# Patient Record
Sex: Female | Born: 1955 | Race: White | Hispanic: No | Smoking: Current every day smoker
Health system: Southern US, Community
[De-identification: ages and names within clinical notes are randomized; demographics above are authoritative.]

## PROBLEM LIST (undated history)

## (undated) ENCOUNTER — Emergency Department: Discharge status: Absent without leave | Payer: Medicare Other

## (undated) DIAGNOSIS — F419 Anxiety disorder, unspecified: Secondary | ICD-10-CM

## (undated) DIAGNOSIS — M1990 Unspecified osteoarthritis, unspecified site: Secondary | ICD-10-CM

## (undated) DIAGNOSIS — F329 Major depressive disorder, single episode, unspecified: Secondary | ICD-10-CM

## (undated) DIAGNOSIS — G629 Polyneuropathy, unspecified: Secondary | ICD-10-CM

## (undated) DIAGNOSIS — C801 Malignant (primary) neoplasm, unspecified: Secondary | ICD-10-CM

## (undated) DIAGNOSIS — J45909 Unspecified asthma, uncomplicated: Secondary | ICD-10-CM

## (undated) DIAGNOSIS — G4700 Insomnia, unspecified: Secondary | ICD-10-CM

## (undated) DIAGNOSIS — F32A Depression, unspecified: Secondary | ICD-10-CM

## (undated) DIAGNOSIS — F1010 Alcohol abuse, uncomplicated: Secondary | ICD-10-CM

## (undated) DIAGNOSIS — F319 Bipolar disorder, unspecified: Secondary | ICD-10-CM

## (undated) DIAGNOSIS — R011 Cardiac murmur, unspecified: Secondary | ICD-10-CM

## (undated) HISTORY — PX: ABDOMINAL HYSTERECTOMY: SHX81

## (undated) HISTORY — PX: ARM AMPUTATION AT SHOULDER: SHX552

## (undated) HISTORY — PX: WISDOM TOOTH EXTRACTION: SHX21

## (undated) HISTORY — PX: COLONOSCOPY: SHX174

---

## 1996-06-23 HISTORY — PX: ARM AMPUTATION: SUR21

## 2004-03-20 ENCOUNTER — Other Ambulatory Visit: Payer: Self-pay

## 2004-06-13 ENCOUNTER — Emergency Department: Payer: Self-pay | Admitting: Emergency Medicine

## 2004-06-14 ENCOUNTER — Inpatient Hospital Stay: Payer: Self-pay | Admitting: Psychiatry

## 2004-06-16 ENCOUNTER — Emergency Department: Payer: Self-pay | Admitting: Emergency Medicine

## 2004-06-17 ENCOUNTER — Inpatient Hospital Stay: Payer: Self-pay | Admitting: Unknown Physician Specialty

## 2004-10-21 ENCOUNTER — Inpatient Hospital Stay: Payer: Self-pay | Admitting: Unknown Physician Specialty

## 2004-11-05 ENCOUNTER — Ambulatory Visit: Payer: Self-pay | Admitting: Unknown Physician Specialty

## 2004-11-21 ENCOUNTER — Ambulatory Visit: Payer: Self-pay | Admitting: Unknown Physician Specialty

## 2004-12-21 ENCOUNTER — Ambulatory Visit: Payer: Self-pay | Admitting: Unknown Physician Specialty

## 2005-01-21 ENCOUNTER — Ambulatory Visit: Payer: Self-pay | Admitting: Unknown Physician Specialty

## 2005-06-23 HISTORY — PX: ABDOMINAL HYSTERECTOMY: SHX81

## 2005-10-09 ENCOUNTER — Ambulatory Visit: Payer: Self-pay | Admitting: Family Medicine

## 2006-05-20 ENCOUNTER — Emergency Department: Payer: Self-pay

## 2006-06-26 ENCOUNTER — Emergency Department: Payer: Self-pay | Admitting: Emergency Medicine

## 2006-07-07 ENCOUNTER — Other Ambulatory Visit: Payer: Self-pay

## 2006-07-07 ENCOUNTER — Emergency Department: Payer: Self-pay | Admitting: Emergency Medicine

## 2006-10-05 ENCOUNTER — Emergency Department: Payer: Self-pay | Admitting: Emergency Medicine

## 2006-10-26 ENCOUNTER — Emergency Department: Payer: Self-pay | Admitting: Emergency Medicine

## 2006-10-28 ENCOUNTER — Emergency Department: Payer: Self-pay

## 2006-12-03 ENCOUNTER — Emergency Department: Payer: Self-pay | Admitting: Emergency Medicine

## 2006-12-03 ENCOUNTER — Other Ambulatory Visit: Payer: Self-pay

## 2007-02-27 ENCOUNTER — Emergency Department: Payer: Self-pay | Admitting: Emergency Medicine

## 2008-04-22 ENCOUNTER — Emergency Department: Payer: Self-pay | Admitting: Emergency Medicine

## 2008-07-18 ENCOUNTER — Ambulatory Visit: Payer: Self-pay | Admitting: Family Medicine

## 2008-08-24 ENCOUNTER — Inpatient Hospital Stay: Payer: Self-pay | Admitting: Internal Medicine

## 2008-08-26 ENCOUNTER — Inpatient Hospital Stay: Payer: Self-pay | Admitting: Psychiatry

## 2010-06-23 HISTORY — PX: JOINT REPLACEMENT: SHX530

## 2012-07-21 ENCOUNTER — Emergency Department: Payer: Self-pay

## 2012-07-21 LAB — COMPREHENSIVE METABOLIC PANEL
Anion Gap: 11 (ref 7–16)
BUN: 17 mg/dL (ref 7–18)
Calcium, Total: 9.1 mg/dL (ref 8.5–10.1)
Chloride: 109 mmol/L — ABNORMAL HIGH (ref 98–107)
Co2: 23 mmol/L (ref 21–32)
Creatinine: 0.67 mg/dL (ref 0.60–1.30)
Glucose: 84 mg/dL (ref 65–99)
Osmolality: 286 (ref 275–301)
SGOT(AST): 20 U/L (ref 15–37)
Sodium: 143 mmol/L (ref 136–145)
Total Protein: 8.2 g/dL (ref 6.4–8.2)

## 2012-07-21 LAB — CBC
HCT: 45.1 % (ref 35.0–47.0)
HGB: 15.4 g/dL (ref 12.0–16.0)
MCHC: 34.1 g/dL (ref 32.0–36.0)
MCV: 89 fL (ref 80–100)
Platelet: 277 10*3/uL (ref 150–440)
WBC: 11.6 10*3/uL — ABNORMAL HIGH (ref 3.6–11.0)

## 2012-07-21 LAB — DRUG SCREEN, URINE
Barbiturates, Ur Screen: NEGATIVE (ref ?–200)
Cannabinoid 50 Ng, Ur ~~LOC~~: NEGATIVE (ref ?–50)
Cocaine Metabolite,Ur ~~LOC~~: NEGATIVE (ref ?–300)
MDMA (Ecstasy)Ur Screen: NEGATIVE (ref ?–500)
Opiate, Ur Screen: NEGATIVE (ref ?–300)
Phencyclidine (PCP) Ur S: NEGATIVE (ref ?–25)
Tricyclic, Ur Screen: NEGATIVE (ref ?–1000)

## 2012-07-21 LAB — URINALYSIS, COMPLETE
Bilirubin,UR: NEGATIVE
Glucose,UR: NEGATIVE mg/dL (ref 0–75)
Leukocyte Esterase: NEGATIVE
Nitrite: NEGATIVE
Protein: NEGATIVE
RBC,UR: NONE SEEN /HPF (ref 0–5)
WBC UR: 1 /HPF (ref 0–5)

## 2012-07-21 LAB — TSH: Thyroid Stimulating Horm: 2.29 u[IU]/mL

## 2013-09-20 ENCOUNTER — Ambulatory Visit: Payer: Self-pay | Admitting: Internal Medicine

## 2013-12-21 ENCOUNTER — Encounter (HOSPITAL_COMMUNITY): Payer: Self-pay | Admitting: *Deleted

## 2013-12-21 ENCOUNTER — Encounter (HOSPITAL_COMMUNITY): Payer: Self-pay | Admitting: Emergency Medicine

## 2013-12-21 ENCOUNTER — Emergency Department (EMERGENCY_DEPARTMENT_HOSPITAL)
Admission: EM | Admit: 2013-12-21 | Discharge: 2013-12-21 | Disposition: A | Discharge status: Other Acute Inpatient Hospital | Payer: Medicare Other | Source: Home / Self Care | Attending: Emergency Medicine | Admitting: Emergency Medicine

## 2013-12-21 ENCOUNTER — Inpatient Hospital Stay (HOSPITAL_COMMUNITY)
Admission: AD | Admit: 2013-12-21 | Discharge: 2013-12-27 | DRG: 897 | Disposition: A | Discharge status: Rehabilitation Facility | Payer: Medicare Other | Source: Intra-hospital | Attending: Psychiatry | Admitting: Psychiatry

## 2013-12-21 DIAGNOSIS — F17200 Nicotine dependence, unspecified, uncomplicated: Secondary | ICD-10-CM | POA: Diagnosis present

## 2013-12-21 DIAGNOSIS — F10930 Alcohol use, unspecified with withdrawal, uncomplicated: Secondary | ICD-10-CM

## 2013-12-21 DIAGNOSIS — F10239 Alcohol dependence with withdrawal, unspecified: Secondary | ICD-10-CM | POA: Insufficient documentation

## 2013-12-21 DIAGNOSIS — R000 Tachycardia, unspecified: Secondary | ICD-10-CM | POA: Insufficient documentation

## 2013-12-21 DIAGNOSIS — F10230 Alcohol dependence with withdrawal, uncomplicated: Secondary | ICD-10-CM

## 2013-12-21 DIAGNOSIS — R45851 Suicidal ideations: Secondary | ICD-10-CM

## 2013-12-21 DIAGNOSIS — F410 Panic disorder [episodic paroxysmal anxiety] without agoraphobia: Secondary | ICD-10-CM | POA: Diagnosis present

## 2013-12-21 DIAGNOSIS — F1010 Alcohol abuse, uncomplicated: Secondary | ICD-10-CM

## 2013-12-21 DIAGNOSIS — F1020 Alcohol dependence, uncomplicated: Principal | ICD-10-CM | POA: Diagnosis present

## 2013-12-21 DIAGNOSIS — Z79899 Other long term (current) drug therapy: Secondary | ICD-10-CM | POA: Insufficient documentation

## 2013-12-21 DIAGNOSIS — G4700 Insomnia, unspecified: Secondary | ICD-10-CM | POA: Diagnosis present

## 2013-12-21 DIAGNOSIS — F332 Major depressive disorder, recurrent severe without psychotic features: Secondary | ICD-10-CM | POA: Diagnosis present

## 2013-12-21 DIAGNOSIS — K5900 Constipation, unspecified: Secondary | ICD-10-CM | POA: Diagnosis present

## 2013-12-21 DIAGNOSIS — F411 Generalized anxiety disorder: Secondary | ICD-10-CM | POA: Diagnosis present

## 2013-12-21 DIAGNOSIS — F4310 Post-traumatic stress disorder, unspecified: Secondary | ICD-10-CM | POA: Diagnosis present

## 2013-12-21 DIAGNOSIS — F10939 Alcohol use, unspecified with withdrawal, unspecified: Secondary | ICD-10-CM | POA: Insufficient documentation

## 2013-12-21 HISTORY — DX: Major depressive disorder, single episode, unspecified: F32.9

## 2013-12-21 HISTORY — DX: Depression, unspecified: F32.A

## 2013-12-21 LAB — URINALYSIS, ROUTINE W REFLEX MICROSCOPIC
Bilirubin Urine: NEGATIVE
Glucose, UA: NEGATIVE mg/dL
Ketones, ur: NEGATIVE mg/dL
Leukocytes, UA: NEGATIVE
Nitrite: NEGATIVE
Protein, ur: NEGATIVE mg/dL
Specific Gravity, Urine: 1.002 — ABNORMAL LOW (ref 1.005–1.030)
Urobilinogen, UA: 0.2 mg/dL (ref 0.0–1.0)
pH: 5.5 (ref 5.0–8.0)

## 2013-12-21 LAB — BASIC METABOLIC PANEL
Anion gap: 19 — ABNORMAL HIGH (ref 5–15)
BUN: 11 mg/dL (ref 6–23)
CO2: 21 mEq/L (ref 19–32)
Calcium: 9.3 mg/dL (ref 8.4–10.5)
Chloride: 98 mEq/L (ref 96–112)
Creatinine, Ser: 0.64 mg/dL (ref 0.50–1.10)
GFR calc Af Amer: >90 mL/min (ref >90)
GFR calc non Af Amer: >90 mL/min (ref >90)
Glucose, Bld: 92 mg/dL (ref 70–99)
Potassium: 3.7 mEq/L (ref 3.7–5.3)
Sodium: 138 mEq/L (ref 137–147)

## 2013-12-21 LAB — CBC WITH DIFFERENTIAL/PLATELET
Basophils Absolute: 0 10*3/uL (ref 0.0–0.1)
Basophils Relative: 0 % (ref 0–1)
Eosinophils Absolute: 0 10*3/uL (ref 0.0–0.7)
Eosinophils Relative: 0 % (ref 0–5)
HCT: 44.6 % (ref 36.0–46.0)
Hemoglobin: 15.6 g/dL — ABNORMAL HIGH (ref 12.0–15.0)
Lymphocytes Relative: 17 % (ref 12–46)
Lymphs Abs: 1.8 10*3/uL (ref 0.7–4.0)
MCH: 30.8 pg (ref 26.0–34.0)
MCHC: 35 g/dL (ref 30.0–36.0)
MCV: 88 fL (ref 78.0–100.0)
Monocytes Absolute: 0.7 10*3/uL (ref 0.1–1.0)
Monocytes Relative: 7 % (ref 3–12)
Neutro Abs: 8 10*3/uL — ABNORMAL HIGH (ref 1.7–7.7)
Neutrophils Relative %: 76 % (ref 43–77)
Platelets: 239 10*3/uL (ref 150–400)
RBC: 5.07 MIL/uL (ref 3.87–5.11)
RDW: 14.5 % (ref 11.5–15.5)
WBC: 10.6 10*3/uL — ABNORMAL HIGH (ref 4.0–10.5)

## 2013-12-21 LAB — RAPID URINE DRUG SCREEN, HOSP PERFORMED
Amphetamines: NOT DETECTED
Barbiturates: NOT DETECTED
Benzodiazepines: NOT DETECTED
Cocaine: NOT DETECTED
Opiates: NOT DETECTED
Tetrahydrocannabinol: NOT DETECTED

## 2013-12-21 LAB — URINE MICROSCOPIC-ADD ON

## 2013-12-21 LAB — ETHANOL: Alcohol, Ethyl (B): 159 mg/dL — ABNORMAL HIGH (ref 0–11)

## 2013-12-21 MED ORDER — ACETAMINOPHEN 325 MG PO TABS
650.0000 mg | ORAL_TABLET | Freq: Four times a day (QID) | ORAL | Status: DC | PRN
  Administered 2013-12-25: 650 mg via ORAL

## 2013-12-21 MED ORDER — ACETAMINOPHEN 325 MG PO TABS: 650.0000 mg | ORAL_TABLET | ORAL | Status: DC | PRN

## 2013-12-21 MED ORDER — HYDROXYZINE HCL 25 MG PO TABS: 25.0000 mg | ORAL_TABLET | Freq: Four times a day (QID) | ORAL | Status: DC | PRN

## 2013-12-21 MED ORDER — ONDANSETRON HCL 4 MG PO TABS: 4.0000 mg | ORAL_TABLET | Freq: Three times a day (TID) | ORAL | Status: DC | PRN

## 2013-12-21 MED ORDER — SODIUM CHLORIDE 0.9 % IV BOLUS (SEPSIS)
1000.0000 mL | Freq: Once | INTRAVENOUS | Status: AC
  Administered 2013-12-21: 1000 mL via INTRAVENOUS

## 2013-12-21 MED ORDER — LORAZEPAM 2 MG/ML IJ SOLN
2.0000 mg | Freq: Once | INTRAMUSCULAR | Status: AC
  Administered 2013-12-21: 2 mg via INTRAVENOUS
  Filled 2013-12-21: qty 1

## 2013-12-21 MED ORDER — ADULT MULTIVITAMIN W/MINERALS CH: 1.0000 | ORAL_TABLET | Freq: Every day | ORAL | Status: DC

## 2013-12-21 MED ORDER — FOLIC ACID 1 MG PO TABS
1.0000 mg | ORAL_TABLET | Freq: Every day | ORAL | Status: DC
  Administered 2013-12-22 – 2013-12-27 (×6): 1 mg via ORAL
  Filled 2013-12-21 (×7): qty 1

## 2013-12-21 MED ORDER — FOLIC ACID 1 MG PO TABS
1.0000 mg | ORAL_TABLET | Freq: Every day | ORAL | Status: DC
  Administered 2013-12-21: 1 mg via ORAL
  Filled 2013-12-21: qty 1

## 2013-12-21 MED ORDER — CHLORDIAZEPOXIDE HCL 25 MG PO CAPS
25.0000 mg | ORAL_CAPSULE | Freq: Every day | ORAL | Status: AC
  Administered 2013-12-25: 25 mg via ORAL
  Filled 2013-12-21: qty 1

## 2013-12-21 MED ORDER — VITAMIN B-1 100 MG PO TABS
100.0000 mg | ORAL_TABLET | Freq: Every day | ORAL | Status: DC
  Administered 2013-12-22 – 2013-12-27 (×6): 100 mg via ORAL
  Filled 2013-12-21 (×7): qty 1

## 2013-12-21 MED ORDER — CHLORDIAZEPOXIDE HCL 25 MG PO CAPS
25.0000 mg | ORAL_CAPSULE | Freq: Four times a day (QID) | ORAL | Status: AC
  Administered 2013-12-21 – 2013-12-22 (×6): 25 mg via ORAL
  Filled 2013-12-21 (×6): qty 1

## 2013-12-21 MED ORDER — BUPROPION HCL ER (XL) 150 MG PO TB24
150.0000 mg | ORAL_TABLET | Freq: Every day | ORAL | Status: DC
  Filled 2013-12-21 (×3): qty 1

## 2013-12-21 MED ORDER — ONDANSETRON 4 MG PO TBDP
4.0000 mg | ORAL_TABLET | Freq: Once | ORAL | Status: AC
  Administered 2013-12-21: 4 mg via ORAL
  Filled 2013-12-21: qty 1

## 2013-12-21 MED ORDER — VITAMIN B-1 100 MG PO TABS
100.0000 mg | ORAL_TABLET | Freq: Once | ORAL | Status: AC
  Administered 2013-12-21: 100 mg via ORAL
  Filled 2013-12-21: qty 1

## 2013-12-21 MED ORDER — TRAZODONE HCL 100 MG PO TABS
200.0000 mg | ORAL_TABLET | Freq: Every day | ORAL | Status: DC
  Administered 2013-12-21 – 2013-12-26 (×6): 200 mg via ORAL
  Filled 2013-12-21 (×3): qty 2
  Filled 2013-12-21: qty 8
  Filled 2013-12-21 (×6): qty 2

## 2013-12-21 MED ORDER — ADULT MULTIVITAMIN W/MINERALS CH
1.0000 | ORAL_TABLET | Freq: Every day | ORAL | Status: DC
  Administered 2013-12-22 – 2013-12-27 (×6): 1 via ORAL
  Filled 2013-12-21 (×7): qty 1

## 2013-12-21 MED ORDER — ADULT MULTIVITAMIN W/MINERALS CH
1.0000 | ORAL_TABLET | Freq: Every day | ORAL | Status: DC
  Administered 2013-12-21: 1 via ORAL
  Filled 2013-12-21: qty 1

## 2013-12-21 MED ORDER — CHLORDIAZEPOXIDE HCL 25 MG PO CAPS
25.0000 mg | ORAL_CAPSULE | Freq: Three times a day (TID) | ORAL | Status: AC
  Administered 2013-12-23 (×3): 25 mg via ORAL
  Filled 2013-12-21 (×3): qty 1

## 2013-12-21 MED ORDER — ONDANSETRON 4 MG PO TBDP
4.0000 mg | ORAL_TABLET | Freq: Four times a day (QID) | ORAL | Status: DC | PRN
  Administered 2013-12-22: 4 mg via ORAL
  Filled 2013-12-21: qty 1

## 2013-12-21 MED ORDER — LOPERAMIDE HCL 2 MG PO CAPS
2.0000 mg | ORAL_CAPSULE | ORAL | Status: AC | PRN
  Administered 2013-12-22: 4 mg via ORAL
  Filled 2013-12-21: qty 2

## 2013-12-21 MED ORDER — MAGNESIUM HYDROXIDE 400 MG/5ML PO SUSP: 30.0000 mL | Freq: Every day | ORAL | Status: DC | PRN

## 2013-12-21 MED ORDER — ALUM & MAG HYDROXIDE-SIMETH 200-200-20 MG/5ML PO SUSP: 30.0000 mL | ORAL | Status: DC | PRN

## 2013-12-21 MED ORDER — THIAMINE HCL 100 MG/ML IJ SOLN: 100.0000 mg | Freq: Once | INTRAMUSCULAR | Status: DC

## 2013-12-21 MED ORDER — LORAZEPAM 1 MG PO TABS
2.0000 mg | ORAL_TABLET | Freq: Once | ORAL | Status: AC
  Administered 2013-12-21: 2 mg via ORAL
  Filled 2013-12-21: qty 2

## 2013-12-21 MED ORDER — CHLORDIAZEPOXIDE HCL 25 MG PO CAPS
25.0000 mg | ORAL_CAPSULE | ORAL | Status: AC
  Administered 2013-12-24 (×2): 25 mg via ORAL
  Filled 2013-12-21 (×2): qty 1

## 2013-12-21 MED ORDER — ARIPIPRAZOLE 5 MG PO TABS
5.0000 mg | ORAL_TABLET | Freq: Every day | ORAL | Status: DC
  Filled 2013-12-21 (×3): qty 1

## 2013-12-21 MED ORDER — GABAPENTIN 400 MG PO CAPS
800.0000 mg | ORAL_CAPSULE | Freq: Three times a day (TID) | ORAL | Status: DC
  Administered 2013-12-21 – 2013-12-27 (×17): 800 mg via ORAL
  Filled 2013-12-21 (×6): qty 2
  Filled 2013-12-21: qty 24
  Filled 2013-12-21 (×2): qty 2
  Filled 2013-12-21: qty 24
  Filled 2013-12-21: qty 2
  Filled 2013-12-21: qty 24
  Filled 2013-12-21 (×13): qty 2

## 2013-12-21 MED ORDER — CHLORDIAZEPOXIDE HCL 25 MG PO CAPS
25.0000 mg | ORAL_CAPSULE | Freq: Four times a day (QID) | ORAL | Status: DC | PRN
  Administered 2013-12-21: 25 mg via ORAL
  Filled 2013-12-21: qty 1

## 2013-12-21 NOTE — ED Provider Notes (Addendum)
CSN: [id number]     Arrival date & time 12/21/13  0941 History   First MD Initiated Contact with Patient 12/21/13 [phone]     No chief complaint on file.    (Consider location/radiation/quality/duration/timing/severity/associated sxs/prior Treatment) HPI  58 year old female presenting for alcohol withdrawal/detox.  patient has a past history of alcohol abuse. Was sober for 4 years.  Relapsed a few months ago. She drinks approximately one case of beer per day. Last drink was at 8:00 this morning. Endorses symptoms of nervousness, nausea, vomiting and diarrhea. She feels like her skin is crawling. Denies any other substance abuse. Denies any suicidal or homicidal ideations. Never had a seizure or hallucinations as past of withdrawal symptoms.   No past medical history on file. No past surgical history on file. No family history on file. History  Substance Use Topics  . Smoking status: Not on file  . Smokeless tobacco: Not on file  . Alcohol Use: Not on file   OB History   No data available     Review of Systems  All systems reviewed and negative, other than as noted in HPI.   Allergies  Cephalosporins  Home Medications   Prior to Admission medications   Medication Sig Start Date End Date Taking? Authorizing Provider  gabapentin (NEURONTIN) 800 MG tablet Take 800 mg by mouth 3 (three) times daily.   Yes Historical Provider, MD  traZODone (DESYREL) 100 MG tablet Take 200 mg by mouth at bedtime.    Yes Historical Provider, MD  venlafaxine XR (EFFEXOR-XR) 150 MG 24 hr capsule Take 150 mg by mouth daily with breakfast.   Yes Historical Provider, MD  ARIPiprazole (ABILIFY) 5 MG tablet Take 5 mg by mouth daily.    Historical Provider, MD  buPROPion (WELLBUTRIN) 75 MG tablet Take 75 mg by mouth 2 (two) times daily.    Historical Provider, MD   BP 114/88  Pulse 124  Temp(Src) 98.1 F (36.7 C) (Oral)  Resp 25  SpO2 95% Physical Exam  Nursing note and vitals  reviewed. Constitutional: She appears well-developed and well-nourished. No distress.  HENT:  Head: Normocephalic and atraumatic.  Eyes: Conjunctivae are normal. Right eye exhibits no discharge. Left eye exhibits no discharge.  Neck: Neck supple.  Cardiovascular: Regular rhythm and normal heart sounds.  Exam reveals no gallop and no friction rub.   No murmur heard. tachycardia  Pulmonary/Chest: Effort normal and breath sounds normal. No respiratory distress.  Abdominal: Soft. She exhibits no distension. There is no tenderness.  Musculoskeletal: She exhibits no edema and no tenderness.  L arm amp  Neurological: She is alert.  Skin: Skin is warm and dry.  Psychiatric:  Seems anxious. Speech clear. Content appropriate. Does not appear to be responding to internal stimuli.     ED Course  Procedures (including critical care time) Labs Review Labs Reviewed  CBC WITH DIFFERENTIAL - Abnormal; Notable for the following:    WBC 10.6 (*)    Hemoglobin 15.6 (*)    Neutro Abs 8.0 (*)    All other components within normal limits  BASIC METABOLIC PANEL - Abnormal; Notable for the following:    Anion gap 19 (*)    All other components within normal limits  ETHANOL - Abnormal; Notable for the following:    Alcohol, Ethyl (B) 159 (*)    All other components within normal limits  URINALYSIS, ROUTINE W REFLEX MICROSCOPIC - Abnormal; Notable for the following:    Specific Gravity, Urine 1.002 (*)  Hgb urine dipstick SMALL (*)    All other components within normal limits  URINE RAPID DRUG SCREEN (HOSP PERFORMED)  URINE MICROSCOPIC-ADD ON    Imaging Review No results found.   EKG Interpretation   Date/Time:  Wednesday December 21 2013 10:29:10 EDT Ventricular Rate:  126 PR Interval:  153 QRS Duration: 84 QT Interval:  304 QTC Calculation: 440 R Axis:   7 Text Interpretation:  Sinus tachycardia Anterior infarct, old baseline  wander in multiple leads No old tracing to compare Confirmed  by Taysom Glymph  MD,  [place] ([id number]) on 12/21/2013 10:32:38 AM      MDM   Final diagnoses:  Alcohol abuse  Alcohol withdrawal, uncomplicated    58 year old female with alcohol abuse and withdrawal symptoms. Past history depression, anxiety and borderline personality disorder. She denies any suicidal homicidal ideation. She does not appear to be psychotic. Will attempt to place for detox/alcohol abuse. Tachycardia likely related to withdrawal. PRN benzos and observation.   2:15 PM Pt accepted at [facility], but would like tachycardia improved prior to transfer. Will place IV. NS bolus Additional ativan .  Virgel Manifold, MD 12/21/13 1416
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
## 2013-12-21 NOTE — ED Notes (Addendum)
Pt belongings transferred to nurse desk by room 16 pt has 2 bags

## 2013-12-21 NOTE — BHH Group Notes (Signed)
Adult Psychoeducational Group Note  Date:  12/21/2013 Time:  10:30 PM  Group Topic/Focus:  Personal Choices and Values:   The focus of this group is to help patients assess and explore the importance of values in their lives, how their values affect their decisions, how they express their values and what opposes their expression.  Participation Level:  Active  Participation Quality:  Appropriate  Affect:  Appropriate  Cognitive:  Alert  Insight: Appropriate  Engagement in Group:  Engaged  Modes of Intervention:  Discussion  Additional Comments:  Pt rated energy level a 1 out of 10.  Pt stated she is here at [facility] because of her alcohol dependency.  Pt stated she is extremely exhausted tonight and just wants to sleep and is hoping tomorrow will be a better day for her. Pt is glad she is here at [facility].  [name] G 12/21/2013, 10:30 PM

## 2013-12-21 NOTE — ED Notes (Signed)
Attempted to call report to [name].  They can not accept the Pt, due to her heart rate.  Will notify MD.

## 2013-12-21 NOTE — Progress Notes (Signed)
Pt observed lying in bed with eyes closed.  Pt promptly responded to her name when called.  Pt states she is here for alcohol detox.  Pt reports she is still feeling anxious and shaky.  Pt denies SI/HI/AV.  Pt was encouraged to make staff aware of her needs.  Pt voices understanding.  Support and encouragement offered.  Safety maintained with q15 minute checks.

## 2013-12-21 NOTE — ED Notes (Addendum)
Pt presents wanting ETOH detox.  C/o emesis, diaphoresis, and increased anxiety.  Report last drink, one can of beer, at 0800.  Pt reports drinking a case of beer per day.  Sts passive SI.  Denies HI and hallucinations.

## 2013-12-21 NOTE — Treatment Plan (Signed)
Pt remains accepted by [facility] pending stabilization of pt's HR which currently in the 120's.  A bed is being held for this patient.

## 2013-12-21 NOTE — Consult Note (Signed)
  Evaluated [name] who says she drinks a case of beer daily.  She was sober for 4 years and started drinking again about 6 months ago.  Says she is stressed with living with her daughter.   She is having withdrawal symptoms.  She has been accepted at [facility] bed 302-2 for treatment of detox from alcohol.

## 2013-12-21 NOTE — BH Assessment (Signed)
Patient accepted to [name] by Dr. Lovena Le. The attending physician is Dr. Carlton Adam. The room assignment is 302-2. Nursing report # is [phone].

## 2013-12-22 DIAGNOSIS — F4310 Post-traumatic stress disorder, unspecified: Secondary | ICD-10-CM | POA: Diagnosis present

## 2013-12-22 DIAGNOSIS — F1994 Other psychoactive substance use, unspecified with psychoactive substance-induced mood disorder: Secondary | ICD-10-CM

## 2013-12-22 LAB — COMPREHENSIVE METABOLIC PANEL
ALT: 17 U/L (ref 0–35)
AST: 25 U/L (ref 0–37)
Albumin: 3.7 g/dL (ref 3.5–5.2)
Alkaline Phosphatase: 85 U/L (ref 39–117)
Anion gap: 13 (ref 5–15)
BILIRUBIN TOTAL: 0.6 mg/dL (ref 0.3–1.2)
BUN: 10 mg/dL (ref 6–23)
CHLORIDE: 100 meq/L (ref 96–112)
CO2: 25 mEq/L (ref 19–32)
Calcium: 9.2 mg/dL (ref 8.4–10.5)
Creatinine, Ser: 0.85 mg/dL (ref 0.50–1.10)
GFR calc Af Amer: 85 mL/min — ABNORMAL LOW (ref >90)
GFR calc non Af Amer: 74 mL/min — ABNORMAL LOW (ref >90)
Glucose, Bld: 142 mg/dL — ABNORMAL HIGH (ref 70–99)
Potassium: 3.7 mEq/L (ref 3.7–5.3)
Sodium: 138 mEq/L (ref 137–147)
Total Protein: 6.8 g/dL (ref 6.0–8.3)

## 2013-12-22 LAB — TSH: TSH: 3.1 u[IU]/mL (ref 0.350–4.500)

## 2013-12-22 MED ORDER — ONDANSETRON HCL 4 MG PO TABS
8.0000 mg | ORAL_TABLET | Freq: Three times a day (TID) | ORAL | Status: DC | PRN
  Administered 2013-12-23: 8 mg via ORAL
  Filled 2013-12-22: qty 2

## 2013-12-22 MED ORDER — PROPRANOLOL HCL 10 MG PO TABS
10.0000 mg | ORAL_TABLET | Freq: Two times a day (BID) | ORAL | Status: DC
  Administered 2013-12-22 – 2013-12-27 (×11): 10 mg via ORAL
  Filled 2013-12-22 (×8): qty 1
  Filled 2013-12-22: qty 8
  Filled 2013-12-22 (×6): qty 1
  Filled 2013-12-22: qty 8
  Filled 2013-12-22: qty 1

## 2013-12-22 MED ORDER — CHLORDIAZEPOXIDE HCL 25 MG PO CAPS
25.0000 mg | ORAL_CAPSULE | Freq: Four times a day (QID) | ORAL | Status: DC | PRN
  Administered 2013-12-22 – 2013-12-26 (×4): 25 mg via ORAL
  Filled 2013-12-22 (×4): qty 1

## 2013-12-22 MED ORDER — VENLAFAXINE HCL ER 150 MG PO CP24
150.0000 mg | ORAL_CAPSULE | Freq: Every day | ORAL | Status: DC
  Administered 2013-12-23 – 2013-12-27 (×5): 150 mg via ORAL
  Filled 2013-12-22: qty 4
  Filled 2013-12-22 (×6): qty 1

## 2013-12-22 MED ORDER — CHLORDIAZEPOXIDE HCL 25 MG PO CAPS
25.0000 mg | ORAL_CAPSULE | Freq: Once | ORAL | Status: AC
  Administered 2013-12-22: 25 mg via ORAL
  Filled 2013-12-22: qty 1

## 2013-12-22 NOTE — Clinical Social Work Note (Signed)
Pt provided with [employer] information per her request and was provided with [facility] list for [place]. Pt plans to follow up with [facility] in [place] at this time. CSW assessing for additional resources that accept Medicare-pt not willing to go out of state or to [facility].  [place], LCSWA 12/22/2013 11:23 AM
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
## 2013-12-22 NOTE — Clinical Social Work Note (Signed)
Per Pt request, [facility] referral sent today at 4:15PM. CSW to check status of referral tomorrow morning. Pt also provided with comprehensive [place] house list, [facility], and [employer] [employer].   [place], LCSWA 12/22/2013 4:17 PM

## 2013-12-22 NOTE — Progress Notes (Signed)
Adult Psychoeducational Group Note  Date:  12/22/2013 Time:  10:27 PM  Group Topic/Focus:  Wrap-Up Group:   The focus of this group is to help patients review their daily goal of treatment and discuss progress on daily workbooks.  Participation Level:  Active  Participation Quality:  Appropriate  Affect:  Appropriate  Cognitive:  Alert  Insight: Appropriate  Engagement in Group:  Supportive  Modes of Intervention:  Activity  Additional Comments:  PT was in group and supported others while they decided to sing during groip  Davarious Tumbleson R 12/22/2013, 10:27 PM

## 2013-12-22 NOTE — BHH Group Notes (Signed)
[place] LCSW Group Therapy  12/22/2013 3:21 PM  Type of Therapy:  Group Therapy  Participation Level:  Active  Participation Quality:  Attentive  Affect:  Appropriate  Cognitive:  Alert and Oriented  Insight:  Engaged  Engagement in Therapy:  Engaged  Modes of Intervention:  Confrontation, Discussion, Education, Exploration, Problem-solving, Rapport Building, Socialization and Support  Summary of Progress/Problems:  Finding Balance in Life. Today's group focused on defining balance in one's own words, identifying things that can knock one off balance, and exploring healthy ways to maintain balance in life. Group members were asked to provide an example of a time when they felt off balance, describe how they handled that situation,and process healthier ways to regain balance in the future. Group members were asked to share the most important tool for maintaining balance that they learned while at [facility] and how they plan to apply this method after discharge. [name] was attentive and engaged throughout today's therapy group. She shared that she plans to pursue inpatient treatment/[facility] due to her past success at staying sober after going there. [name] shows progress in the group setting and improving insight AEB her ability to identify how her sobriety will bring balance to other facets of life including: interpersonal relationships, physical health, emotional wellbeing, and ability to pursue interests/hobbies. She provided helpful resource information to other group members during today's session.    [name], [name] LCSWA  12/22/2013, 3:21 PM
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
## 2013-12-22 NOTE — Progress Notes (Signed)
Recreation Therapy Notes  Animal-Assisted Activity/Therapy (AAA/T) Program Checklist/Progress Notes Patient Eligibility Criteria Checklist & Daily Group note for Rec Tx Intervention  Date: 07.02.2015 Time: 2:45pm Location: [address]   AAA/T Program Assumption of Risk Form signed by Patient/ or Parent Legal Guardian yes  Patient is free of allergies or sever asthma yes  Patient reports no fear of animals yes  Patient reports no history of cruelty to animals yes   Patient understands his/her participation is voluntary yes  Patient washes hands before animal contact yes  Patient washes hands after animal contact yes  Behavioral Response: Appropriate   Education: Hand Washing, Appropriate Animal Interaction   Education Outcome: Acknowledges understanding   Clinical Observations/Feedback: Patient interacted appropriately with therapeutic dog team during sessions.   [name] [name], LRT/CTRS  [name] L 12/22/2013 4:05 PM

## 2013-12-22 NOTE — BHH Group Notes (Signed)
0900 nursing orientation group    The focus of this group is to educate the patient on the purpose and policies of crisis stabilization and provide a format to answer questions about their admission.  The group details unit policies and expectations of patients while admitted.   She was an active participant in group and was appropriate in sharing her goal today,"do whatever I have to do to not be sick"

## 2013-12-22 NOTE — Progress Notes (Signed)
Patient ID: [name], female   DOB: Jun 24, 1955, 58 y.o.   MRN: [id number] PER STATE REGULATIONS 482.30  THIS CHART WAS REVIEWED FOR MEDICAL NECESSITY WITH RESPECT TO THE PATIENT'S ADMISSION/ DURATION OF STAY.  NEXT REVIEW DATE:12/25/2013   Chauncy Lean, RN, BSN CASE MANAGER

## 2013-12-22 NOTE — BHH Suicide Risk Assessment (Signed)
[place] INPATIENT: Family/Significant Other Suicide Prevention Education  Suicide Prevention Education:  Education Completed; No one has been identified by the patient as the family member/significant other with whom the patient will be residing, and identified as the person(s) who will aid the patient in the event of a mental health crisis (suicidal ideations/suicide attempt).   Pt did not c/o SI at admission, nor have they endorsed SI during their stay here. SPE not required. SPI pamphlet provided to pt and she was encouraged to share information with support network, ask questions, and talk about any concerns relating to SPE.  [place], LCSWA 12/22/2013 10:14 AM

## 2013-12-22 NOTE — BHH Suicide Risk Assessment (Signed)
Suicide Risk Assessment  Admission Assessment     Nursing information obtained from:    Demographic factors:    Current Mental Status:    Loss Factors:    Historical Factors:    Risk Reduction Factors:    Total Time spent with patient: 45 minutes  CLINICAL FACTORS:   Severe Anxiety and/or Agitation Depression:   Comorbid alcohol abuse/dependence Alcohol/Substance Abuse/Dependencies  PsyCOGNITIVE FEATURES THAT CONTRIBUTE TO RISK:  Closed-mindedness Polarized thinking Thought constriction (tunnel vision)    SUICIDE RISK:   Moderate:  Frequent suicidal ideation with limited intensity, and duration, some specificity in terms of plans, no associated intent, good self-control, limited dysphoria/symptomatology, some risk factors present, and identifiable protective factors, including available and accessible social support.  PLAN OF CARE: Supportive approach/coping skills/relapse prevention                               [facility] detox/reassess and address the co morbidities  I certify that inpatient services furnished can reasonably be expected to improve the patient's condition.  Hovanes Hymas A 12/22/2013, 3:23 PM

## 2013-12-23 NOTE — Progress Notes (Signed)
D   Pt is depressed and anxious   She did attend group this evening and told staff that she didn't really  Feel like going but that she was trying to participate and be part of the unit activities   She has some moderate tremors and increased anxiety  A   Verbal support given   Medications administered and effectiveness monitored   Q 15 min checks R   Pt is safe at present
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
## 2013-12-23 NOTE — BHH Group Notes (Signed)
[facility] LCSW Aftercare Discharge Planning Group Note   12/23/2013 9:48 AM  Participation Quality:  Appropriate   Mood/Affect:  Anxious  Depression Rating:  5  Anxiety Rating:  6  Thoughts of Suicide:  No Will you contract for safety?   NA  Current AVH:  No  Plan for Discharge/Comments:  [facility] referral made for pt yesterday per her request. Pt given [place] low income/transitional housing info. Pt follows up outpatient at [facility] for med management. Moderate withdrawals including nausea, sweating, and anxiety.   Transportation Means: brother   Supports: brother/limited family supports   Proofreader, Research officer, trade union

## 2013-12-23 NOTE — BHH Group Notes (Signed)
[place] LCSW Group Therapy  12/23/2013 3:13 PM  Type of Therapy:  Group Therapy  Participation Level:  Active  Participation Quality:  Attentive  Affect:  Appropriate  Cognitive:  Alert and Oriented  Insight:  Engaged  Engagement in Therapy:  Engaged  Modes of Intervention:  Confrontation, Discussion, Education, Exploration, Problem-solving, Rapport Building, Socialization and Support  Summary of Progress/Problems: Feelings around Relapse. Group members discussed the meaning of relapse and shared personal stories of relapse, how it affected them and others, and how they perceived themselves during this time. Group members were encouraged to identify triggers, warning signs and coping skills used when facing the possibility of relapse. Social supports were discussed and explored in detail. Post Acute Withdrawal Syndrome (handout provided) was introduced and examined. Pt's were encouraged to ask questions, talk about key points associated with PAWS, and process this information in terms of relapse prevention. [name] was attentive and engaged throughout today's therapy group. She shared that she has made it past two years of sobriety in the past and talked about her experience with PAWS. [name] shows progress in the group setting and improving insight AEB her ability to process how "reaching out to my support network, going to groups, and self care" can help avoid relapse when experiencing PAWS during recovery process.    Smart, [name] LCSWA 12/23/2013, 3:13 PM
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
## 2013-12-23 NOTE — Progress Notes (Signed)
D: Patient denies SI/HI and A/V hallucinations; patient reports sleep was well; reports appetite is improving; reports energy level is low ; reports ability to pay attention is improving; rates depression as 5/10; rates hopelessness 5/10; rates anxiety as 5/10; patient reporting tremors, cravings, agitation, and chills  A: Monitored q 15 minutes; patient encouraged to attend groups; patient educated about medications; patient given medications per physician orders; patient encouraged to express feelings and/or concerns  R: Patient is pleasant and cooperative; patient complaining of nausea but has had some relief; patient did have some tremors that can be felt when touched; patient's interaction with staff and peers is appropriate but is minimal at times; patient was able to set goal to talk with staff 1:1 when having feelings of SI; patient is taking medications as prescribed and tolerating medications; patient is attending all groups

## 2013-12-23 NOTE — BHH Group Notes (Signed)
Adult Psychoeducational Group Note  Date:  12/23/2013 Time:  11:13 AM  Group Topic/Focus:  Healthy Communication:   The focus of this group is to discuss communication, barriers to communication, as well as healthy ways to communicate with others.  Participation Level:  Active  Participation Quality:  Appropriate, Attentive and Sharing  Affect:  Appropriate  Cognitive:  Alert and Appropriate  Insight: Good  Engagement in Group:  Engaged  Modes of Intervention:  Discussion, Education and Socialization  Additional Comments:  She provided feedback on the "Do's and Don'ts of Communication" by stating that "cooling down and revisiting a conversation is good for communication". She provided with the group that stepping away from a heated conversation and coming back later may  improve communication.   Hettie Holstein D 12/23/2013, 11:13 AM

## 2013-12-23 NOTE — Progress Notes (Signed)
[facility] MD Progress Note  12/23/2013 2:28 PM [name]  MRN:  [id number] Subjective:  [name] states that she is still anxious, worried. She is going to try to contact her son today. States her daughter is not wanting to do anything with her. Admits she gets to worry and gets very overwhelmed. She is used to drinking as a way to sooth herself and now she does not have the alcohol available to her. States she wants to go to a residential treatment program as does not see herself as able to make it if she was not to go.  Diagnosis:   DSM5: Schizophrenia Disorders:  none Obsessive-Compulsive Disorders:  none Trauma-Stressor Disorders:  Posttraumatic Stress Disorder (309.81) Substance/Addictive Disorders:  Alcohol Related Disorder - Severe (303.90) Depressive Disorders:  Major Depressive Disorder - Severe (296.23) Total Time spent with patient: 30 minutes  Axis I: Substance Induced Mood Disorder  ADL's:  Intact  Sleep: Fair  Appetite:  Fair  Suicidal Ideation:  Plan:  denies Intent:  denies Means:  denies Homicidal Ideation:  Plan:  denies Intent:  denies Means:  denies AEB (as evidenced by):  Psychiatric Specialty Exam: Physical Exam  Review of Systems  Constitutional: Positive for malaise/fatigue.  HENT: Negative.   Eyes: Negative.   Respiratory: Negative.   Cardiovascular: Negative.   Gastrointestinal: Negative.   Genitourinary: Negative.   Musculoskeletal: Negative.   Skin: Negative.   Neurological: Negative.   Endo/Heme/Allergies: Negative.   Psychiatric/Behavioral: Positive for depression and substance abuse. The patient is nervous/anxious.     Blood pressure 108/65, pulse 73, temperature 98.1 F (36.7 C), temperature source Oral, resp. rate 16, height _0  (1.626 m), weight 67.586 kg (149 lb), SpO2 96.00%.Body mass index is 25.56 kg/(m^2).  General Appearance: Fairly Groomed  Engineer, water::  Fair  Speech:  Clear and Coherent  Volume:  Normal  Mood:  Anxious,  Depressed and worried  Affect:  anxious, worried  Thought Process:  Coherent and Goal Directed  Orientation:  Full (Time, Place, and Person)  Thought Content:  symtptoms worries, concerns  Suicidal Thoughts:  No  Homicidal Thoughts:  No  Memory:  Immediate;   Fair Recent;   Fair Remote;   Fair  Judgement:  Fair  Insight:  Present  Psychomotor Activity:  Restlessness  Concentration:  Fair  Recall:  [employer] of Knowledge:NA  Language: Fair  Akathisia:  No  Handed:    AIMS (if indicated):     Assets:  Desire for Improvement  Sleep:      Musculoskeletal: Strength & Muscle Tone: within normal limits Gait & Station: normal Patient leans: N/A  Current Medications: Current Facility-Administered Medications  Medication Dose Route Frequency Provider Last Rate Last Dose  . acetaminophen (TYLENOL) tablet 650 mg  650 mg Oral Q6H PRN Clarene Reamer, MD      . alum & mag hydroxide-simeth (MAALOX/MYLANTA) 200-200-20 MG/5ML suspension 30 mL  30 mL Oral Q4H PRN Clarene Reamer, MD      . chlordiazePOXIDE (LIBRIUM) capsule 25 mg  25 mg Oral TID Clarene Reamer, MD   25 mg at 12/23/13 1153   Followed by  . [START ON 12/24/2013] chlordiazePOXIDE (LIBRIUM) capsule 25 mg  25 mg Oral BH-qamhs Clarene Reamer, MD       Followed by  . [START ON 12/25/2013] chlordiazePOXIDE (LIBRIUM) capsule 25 mg  25 mg Oral Daily Clarene Reamer, MD      . chlordiazePOXIDE (LIBRIUM) capsule 25 mg  25  mg Oral Q6H PRN Nicholaus Bloom, MD   25 mg at 12/22/13 1022  . folic acid (FOLVITE) tablet 1 mg  1 mg Oral Daily Clarene Reamer, MD   1 mg at 12/23/13 0753  . gabapentin (NEURONTIN) capsule 800 mg  800 mg Oral TID Clarene Reamer, MD   800 mg at 12/23/13 1153  . loperamide (IMODIUM) capsule 2-4 mg  2-4 mg Oral PRN Clarene Reamer, MD   4 mg at 12/22/13 2458  . magnesium hydroxide (MILK OF MAGNESIA) suspension 30 mL  30 mL Oral Daily PRN Clarene Reamer, MD      . multivitamin with minerals tablet 1 tablet  1 tablet Oral  Daily Clarene Reamer, MD   1 tablet at 12/23/13 0754  . ondansetron (ZOFRAN) tablet 8 mg  8 mg Oral Q8H PRN Encarnacion Slates, NP   8 mg at 12/23/13 0759  . propranolol (INDERAL) tablet 10 mg  10 mg Oral BID Nicholaus Bloom, MD   10 mg at 12/23/13 0754  . thiamine (B-1) injection 100 mg  100 mg Intramuscular Once Clarene Reamer, MD      . thiamine (VITAMIN B-1) tablet 100 mg  100 mg Oral Daily Clarene Reamer, MD   100 mg at 12/23/13 0754  . traZODone (DESYREL) tablet 200 mg  200 mg Oral QHS Clarene Reamer, MD   200 mg at 12/22/13 2150  . venlafaxine XR (EFFEXOR-XR) 24 hr capsule 150 mg  150 mg Oral Q breakfast Nicholaus Bloom, MD   150 mg at 12/23/13 0754    Lab Results:  Results for orders placed during the hospital encounter of 12/21/13 (from the past 48 hour(s))  COMPREHENSIVE METABOLIC PANEL     Status: Abnormal   Collection Time    12/22/13  6:59 PM      Result Value Ref Range   Sodium 138  137 - 147 mEq/L   Potassium 3.7  3.7 - 5.3 mEq/L   Chloride 100  96 - 112 mEq/L   CO2 25  19 - 32 mEq/L   Glucose, Bld 142 (*) 70 - 99 mg/dL   BUN 10  6 - 23 mg/dL   Creatinine, Ser 0.85  0.50 - 1.10 mg/dL   Calcium 9.2  8.4 - 10.5 mg/dL   Total Protein 6.8  6.0 - 8.3 g/dL   Albumin 3.7  3.5 - 5.2 g/dL   AST 25  0 - 37 U/L   ALT 17  0 - 35 U/L   Alkaline Phosphatase 85  39 - 117 U/L   Total Bilirubin 0.6  0.3 - 1.2 mg/dL   GFR calc non Af Amer 74 (*) >90 mL/min   GFR calc Af Amer 85 (*) >90 mL/min   Comment: (NOTE)     The eGFR has been calculated using the CKD EPI equation.     This calculation has not been validated in all clinical situations.     eGFR's persistently <90 mL/min signify possible Chronic Kidney     Disease.   Anion gap 13  5 - 15   Comment: Performed at [facility]  TSH     Status: None   Collection Time    12/22/13  6:59 PM      Result Value Ref Range   TSH 3.100  0.350 - 4.500 uIU/mL   Comment: Performed at [facility]    Physical  Findings: AIMS: Facial and Oral  Movements Muscles of Facial Expression: None, normal Lips and Perioral Area: None, normal Jaw: None, normal Tongue: None, normal,Extremity Movements Upper (arms, wrists, hands, fingers): None, normal Lower (legs, knees, ankles, toes): None, normal, Trunk Movements Neck, shoulders, hips: None, normal, Overall Severity Severity of abnormal movements (highest score from questions above): None, normal Incapacitation due to abnormal movements: None, normal Patient's awareness of abnormal movements (rate only patient's report): No Awareness, Dental Status Current problems with teeth and/or dentures?: No Does patient usually wear dentures?: No  CIWA:  CIWA-Ar Total: 5 COWS:     Treatment Plan Summary: Daily contact with patient to assess and evaluate symptoms and progress in treatment Medication management  Plan: Supportive approach/coping skills/relapse prevention           Continue detox/reassess and address the co morbidities           CBT;mindfulness  Medical Decision Making Problem Points:  Review of psycho-social stressors (1) Data Points:  Review of medication regiment & side effects (2)  I certify that inpatient services furnished can reasonably be expected to improve the patient's condition.   Mischa Brittingham A 12/23/2013, 2:28 PM

## 2013-12-24 DIAGNOSIS — F411 Generalized anxiety disorder: Secondary | ICD-10-CM | POA: Diagnosis present

## 2013-12-24 MED ORDER — QUETIAPINE FUMARATE 25 MG PO TABS
25.0000 mg | ORAL_TABLET | Freq: Four times a day (QID) | ORAL | Status: DC | PRN
  Administered 2013-12-24 – 2013-12-26 (×4): 25 mg via ORAL
  Filled 2013-12-24: qty 6
  Filled 2013-12-24 (×4): qty 1

## 2013-12-24 NOTE — BHH Group Notes (Signed)
[place] Group Notes:  (Clinical Social Work)  12/24/2013     10-11AM  Summary of Progress/Problems:   The main focus of today's process group was for the patient to identify ways in which they have in the past sabotaged their own recovery. Motivational Interviewing and a worksheet were utilized to help patients explore in depth the perceived benefits and costs of their substance use, as well as the potential benefits and costs of stopping.  The Stages of Change were explained using a handout, with an emphasis on making plans to deal with sabotaging behaviors proactively.  The patient expressed that her self-sabotaging behavior is negative self-talk, telling herself she is hopeless and worthless.  This leads her to drink to numb the emotional pain.  She displayed good insight into having a therapist as an outlet for feelings, as well as a manner in which new skills could be learned and tested.  Type of Therapy:  Group Therapy - Process   Participation Level:  Active  Participation Quality:  Attentive and Sharing  Affect:  Depressed and Flat  Cognitive:  Appropriate and Oriented  Insight:  Engaged  Engagement in Therapy:  Engaged  Modes of Intervention:  Education, Psychiatric nurse, Motivational Interviewing  Selmer Dominion, LCSW 12/24/2013, 12:35 PM

## 2013-12-24 NOTE — Progress Notes (Signed)
D: Patient presents a flat affect and depressed mood on approach. Brightened up a little with a smile during interaction with this Probation officer. Patient denies SI, AH/VH. Patient also denies pain at this time. Patient reports decrease in nausea. Patient expresses the need to obtain help so that she can stop drinking. "I have lost so much; family friends.Marland KitchenMarland KitchenI really need to stop".  A: Support and encouragement provided to the patient. Patient encouraged to adhere with treatment plans and medication regimen. Encouraged to verbalize need and concerns to staff. Safety maintained. medications given as ordered by the physician.  R: Patient receptive and coperative. Patient complied with medications. Will continue to monitor patient.

## 2013-12-24 NOTE — Progress Notes (Signed)
D   Pt reports sleeping well last night  She said her appetite is improving  She reports a low energy level and her ability to pay attention is improving   She rates her depression and hopelessness at a 4  Her withdrawal symptoms include tremors chilling and agitation   She also said she is having some cravings  She denies suicidal ideation and does contract for safety if she started feeling that way   Pt said she thinks she would benefit from going to rehab after her detox here A   Verbal support given    Medications administered and effectiveness monitored    Q 15 min checks R   Pt safe at present

## 2013-12-24 NOTE — Progress Notes (Signed)
Psychoeducational Group Note  Date:  12/24/2013 Time:  0945 am  Group Topic/Focus:  Identifying Needs:   The focus of this group is to help patients identify their personal needs that have been historically problematic and identify healthy behaviors to address their needs.  Participation Level:  Minimal  Participation Quality:  Appropriate  Affect:  Appropriate  Cognitive:  Alert  Insight:  Developing/Improving  Engagement in Group:  Developing/Improving  Additional Comments:    Migdalia Dk 12/24/2013,10:28 AM

## 2013-12-24 NOTE — Progress Notes (Signed)
[facility] MD Progress Note  12/24/2013 1:40 PM [name]  MRN:  [id number] Subjective:  [name] admits she is experiencing a lot of anxiety and worry. The Inderal seems to be helping some. She realizes that we are not going to keep her on benzodiazepines. She has called her son but he has not called back. States she feels she is pretty much on her own what increases her anxiety. She did sleep better last night. Shortly after she woke up the anxiety started. Diagnosis:   DSM5: Schizophrenia Disorders:  none Obsessive-Compulsive Disorders:  none Trauma-Stressor Disorders:  Posttraumatic Stress Disorder (309.81) Substance/Addictive Disorders:  Alcohol Related Disorder - Severe (303.90) Depressive Disorders:  Major Depressive Disorder - Moderate (296.22) Total Time spent with patient: 30 minutes  Axis I: Generalized Anxiety Disorder  ADL's:  Intact  Sleep: Fair  Appetite:  Fair  Suicidal Ideation:  Plan:  denies Intent:  denies Means:  denies Homicidal Ideation:  Plan:  denies Intent:  denies Means:  denies AEB (as evidenced by):  Psychiatric Specialty Exam: Physical Exam  Review of Systems  Constitutional: Negative.   HENT: Negative.   Eyes: Negative.   Respiratory: Negative.   Cardiovascular: Negative.   Gastrointestinal: Negative.   Genitourinary: Negative.   Musculoskeletal: Negative.   Skin: Negative.   Neurological: Negative.   Endo/Heme/Allergies: Negative.   Psychiatric/Behavioral: Positive for depression and substance abuse. The patient is nervous/anxious.     Blood pressure 101/61, pulse 65, temperature 98 F (36.7 C), temperature source Oral, resp. rate 16, height 5\' 4"  (1.626 m), weight 67.586 kg (149 lb), SpO2 96.00%.Body mass index is 25.56 kg/(m^2).  General Appearance: Fairly Groomed  Engineer, water::  Fair  Speech:  Clear and Coherent  Volume:  Decreased  Mood:  Anxious and worried  Affect:  anxious, worried  Thought Process:  Coherent and Goal Directed   Orientation:  Full (Time, Place, and Person)  Thought Content:  symptoms, worries, concerns  Suicidal Thoughts:  No  Homicidal Thoughts:  No  Memory:  Immediate;   Fair Recent;   Fair Remote;   Fair  Judgement:  Fair  Insight:  Present  Psychomotor Activity:  Restlessness  Concentration:  Fair  Recall:  [employer] of Knowledge:NA  Language: Fair  Akathisia:  No  Handed:    AIMS (if indicated):     Assets:  Desire for Improvement  Sleep:  Number of Hours: 6.75   Musculoskeletal: Strength & Muscle Tone: within normal limits Gait & Station: normal Patient leans: N/A  Current Medications: Current Facility-Administered Medications  Medication Dose Route Frequency Provider Last Rate Last Dose  . acetaminophen (TYLENOL) tablet 650 mg  650 mg Oral Q6H PRN Clarene Reamer, MD      . alum & mag hydroxide-simeth (MAALOX/MYLANTA) 200-200-20 MG/5ML suspension 30 mL  30 mL Oral Q4H PRN Clarene Reamer, MD      . chlordiazePOXIDE (LIBRIUM) capsule 25 mg  25 mg Oral BH-qamhs Clarene Reamer, MD   25 mg at 12/24/13 0817   Followed by  . [START ON 12/25/2013] chlordiazePOXIDE (LIBRIUM) capsule 25 mg  25 mg Oral Daily Clarene Reamer, MD      . chlordiazePOXIDE (LIBRIUM) capsule 25 mg  25 mg Oral Q6H PRN Nicholaus Bloom, MD   25 mg at 12/22/13 1022  . folic acid (FOLVITE) tablet 1 mg  1 mg Oral Daily Clarene Reamer, MD   1 mg at 12/24/13 0817  . gabapentin (NEURONTIN) capsule 800 mg  800 mg Oral TID Clarene Reamer, MD   800 mg at 12/24/13 1200  . loperamide (IMODIUM) capsule 2-4 mg  2-4 mg Oral PRN Clarene Reamer, MD   4 mg at 12/22/13 3086  . magnesium hydroxide (MILK OF MAGNESIA) suspension 30 mL  30 mL Oral Daily PRN Clarene Reamer, MD      . multivitamin with minerals tablet 1 tablet  1 tablet Oral Daily Clarene Reamer, MD   1 tablet at 12/24/13 [phone]  . ondansetron (ZOFRAN) tablet 8 mg  8 mg Oral Q8H PRN Encarnacion Slates, NP   8 mg at 12/23/13 0759  . propranolol (INDERAL) tablet 10 mg  10  mg Oral BID Nicholaus Bloom, MD   10 mg at 12/24/13 0817  . QUEtiapine (SEROQUEL) tablet 25 mg  25 mg Oral Q6H PRN Nicholaus Bloom, MD   25 mg at 12/24/13 1200  . thiamine (B-1) injection 100 mg  100 mg Intramuscular Once Clarene Reamer, MD      . thiamine (VITAMIN B-1) tablet 100 mg  100 mg Oral Daily Clarene Reamer, MD   100 mg at 12/24/13 0817  . traZODone (DESYREL) tablet 200 mg  200 mg Oral QHS Clarene Reamer, MD   200 mg at 12/23/13 2128  . venlafaxine XR (EFFEXOR-XR) 24 hr capsule 150 mg  150 mg Oral Q breakfast Nicholaus Bloom, MD   150 mg at 12/24/13 6962    Lab Results:  Results for orders placed during the hospital encounter of 12/21/13 (from the past 48 hour(s))  COMPREHENSIVE METABOLIC PANEL     Status: Abnormal   Collection Time    12/22/13  6:59 PM      Result Value Ref Range   Sodium 138  137 - 147 mEq/L   Potassium 3.7  3.7 - 5.3 mEq/L   Chloride 100  96 - 112 mEq/L   CO2 25  19 - 32 mEq/L   Glucose, Bld 142 (*) 70 - 99 mg/dL   BUN 10  6 - 23 mg/dL   Creatinine, Ser 0.85  0.50 - 1.10 mg/dL   Calcium 9.2  8.4 - 10.5 mg/dL   Total Protein 6.8  6.0 - 8.3 g/dL   Albumin 3.7  3.5 - 5.2 g/dL   AST 25  0 - 37 U/L   ALT 17  0 - 35 U/L   Alkaline Phosphatase 85  39 - 117 U/L   Total Bilirubin 0.6  0.3 - 1.2 mg/dL   GFR calc non Af Amer 74 (*) >90 mL/min   GFR calc Af Amer 85 (*) >90 mL/min   Comment: (NOTE)     The eGFR has been calculated using the CKD EPI equation.     This calculation has not been validated in all clinical situations.     eGFR's persistently <90 mL/min signify possible Chronic Kidney     Disease.   Anion gap 13  5 - 15   Comment: Performed at [facility]  TSH     Status: None   Collection Time    12/22/13  6:59 PM      Result Value Ref Range   TSH 3.100  0.350 - 4.500 uIU/mL   Comment: Performed at [facility]    Physical Findings: AIMS: Facial and Oral Movements Muscles of Facial Expression: None, normal Lips and  Perioral Area: None, normal Jaw: None, normal Tongue: None, normal,Extremity Movements Upper (arms, wrists,  hands, fingers): None, normal Lower (legs, knees, ankles, toes): None, normal, Trunk Movements Neck, shoulders, hips: None, normal, Overall Severity Severity of abnormal movements (highest score from questions above): None, normal Incapacitation due to abnormal movements: None, normal Patient's awareness of abnormal movements (rate only patient's report): No Awareness, Dental Status Current problems with teeth and/or dentures?: No Does patient usually wear dentures?: No  CIWA:  CIWA-Ar Total: 2 COWS:     Treatment Plan Summary: Daily contact with patient to assess and evaluate symptoms and progress in treatment Medication management  Plan:  Supportive approach/coping skills/relapse prevention            Continue detox            CBT;mindfulness/stress anxiety management            Trial with Seroquel 25 mg Q 6 PRN              Medical Decision Making Problem Points:  Established problem, worsening (2) and Review of psycho-social stressors (1) Data Points:  Review of medication regiment & side effects (2) Review of new medications or change in dosage (2)  I certify that inpatient services furnished can reasonably be expected to improve the patient's condition.   Jonai Weyland A 12/24/2013, 1:40 PM

## 2013-12-25 MED ORDER — MAGNESIUM CITRATE PO SOLN
1.0000 | Freq: Once | ORAL | Status: AC
  Administered 2013-12-25: 1 via ORAL

## 2013-12-25 MED ORDER — BISACODYL 10 MG RE SUPP
10.0000 mg | Freq: Every day | RECTAL | Status: DC | PRN
  Administered 2013-12-25: 10 mg via RECTAL
  Filled 2013-12-25: qty 1

## 2013-12-25 MED ORDER — DOCUSATE SODIUM 100 MG PO CAPS
100.0000 mg | ORAL_CAPSULE | Freq: Two times a day (BID) | ORAL | Status: DC
  Administered 2013-12-25 – 2013-12-27 (×3): 100 mg via ORAL
  Filled 2013-12-25 (×4): qty 1
  Filled 2013-12-25: qty 8
  Filled 2013-12-25: qty 1
  Filled 2013-12-25: qty 8
  Filled 2013-12-25: qty 1

## 2013-12-25 NOTE — BHH Group Notes (Signed)
[facility] Group Notes: (Clinical Social Work)   12/25/2013      Type of Therapy:  Group Therapy   Participation Level:  Did Not Attend - ATTENDS GROUPS ON [place], LCSW 12/25/2013, 12:59 PM

## 2013-12-25 NOTE — Progress Notes (Signed)
Pt attended group this afternoon. Pt participate in group with peers.

## 2013-12-25 NOTE — Progress Notes (Signed)
[facility] MD Progress Note  12/25/2013 1:57 PM [name]  MRN:  [id number] Subjective:  [name] continues to work on getting her life back together. Has not heard from her son. States she does not think she can make it without going to a residential treatment program. Still dealing with anxiety but coping somewhat better. Dealing with the realization of how she has allowed alcohol to take over her life Diagnosis:   DSM5: Schizophrenia Disorders:  none Obsessive-Compulsive Disorders:  none Trauma-Stressor Disorders:  Posttraumatic Stress Disorder (309.81) Substance/Addictive Disorders:  Alcohol Related Disorder - Severe (303.90) Depressive Disorders:  Major Depressive Disorder - Severe (296.23) Total Time spent with patient: 30 minutes  Axis I: Generalized Anxiety Disorder  ADL's:  Intact  Sleep: Fair  Appetite:  Fair  Suicidal Ideation:  Plan:  denies Intent:  denies Means:  denies Homicidal Ideation:  Plan:  denies Intent:  denies Means:  denies AEB (as evidenced by):  Psychiatric Specialty Exam: Physical Exam  Review of Systems  Constitutional: Positive for malaise/fatigue.  HENT: Negative.   Eyes: Negative.   Respiratory: Negative.   Cardiovascular: Negative.   Gastrointestinal: Positive for constipation.  Genitourinary: Negative.   Musculoskeletal: Negative.   Skin: Negative.   Neurological: Negative.   Endo/Heme/Allergies: Negative.   Psychiatric/Behavioral: Positive for depression and substance abuse. The patient is nervous/anxious.     Blood pressure 100/66, pulse 68, temperature 97.8 F (36.6 C), temperature source Oral, resp. rate 20, height 5\' 4"  (1.626 m), weight 67.586 kg (149 lb), SpO2 96.00%.Body mass index is 25.56 kg/(m^2).  General Appearance: Fairly Groomed  Engineer, water::  Fair  Speech:  Clear and Coherent  Volume:  Decreased  Mood:  Anxious and sad, worried  Affect:  anxious, worried  Thought Process:  Coherent and Goal Directed  Orientation:  Full  (Time, Place, and Person)  Thought Content:  symtpoms, worries, concerns  Suicidal Thoughts:  No  Homicidal Thoughts:  No  Memory:  Immediate;   Fair Recent;   Fair Remote;   Fair  Judgement:  Fair  Insight:  Present  Psychomotor Activity:  Restlessness  Concentration:  Fair  Recall:  [employer] of Knowledge:NA  Language: Fair  Akathisia:  No  Handed:    AIMS (if indicated):     Assets:  Desire for Improvement  Sleep:  Number of Hours: 6.75   Musculoskeletal: Strength & Muscle Tone: within normal limits Gait & Station: normal Patient leans: N/A  Current Medications: Current Facility-Administered Medications  Medication Dose Route Frequency Provider Last Rate Last Dose  . acetaminophen (TYLENOL) tablet 650 mg  650 mg Oral Q6H PRN Clarene Reamer, MD      . alum & mag hydroxide-simeth (MAALOX/MYLANTA) 200-200-20 MG/5ML suspension 30 mL  30 mL Oral Q4H PRN Clarene Reamer, MD      . chlordiazePOXIDE (LIBRIUM) capsule 25 mg  25 mg Oral Q6H PRN Nicholaus Bloom, MD   25 mg at 12/22/13 1022  . folic acid (FOLVITE) tablet 1 mg  1 mg Oral Daily Clarene Reamer, MD   1 mg at 12/25/13 6378  . gabapentin (NEURONTIN) capsule 800 mg  800 mg Oral TID Clarene Reamer, MD   800 mg at 12/25/13 1201  . magnesium hydroxide (MILK OF MAGNESIA) suspension 30 mL  30 mL Oral Daily PRN Clarene Reamer, MD      . multivitamin with minerals tablet 1 tablet  1 tablet Oral Daily Clarene Reamer, MD   1 tablet  at 12/25/13 0826  . ondansetron (ZOFRAN) tablet 8 mg  8 mg Oral Q8H PRN Encarnacion Slates, NP   8 mg at 12/23/13 0759  . propranolol (INDERAL) tablet 10 mg  10 mg Oral BID Nicholaus Bloom, MD   10 mg at 12/25/13 0825  . QUEtiapine (SEROQUEL) tablet 25 mg  25 mg Oral Q6H PRN Nicholaus Bloom, MD   25 mg at 12/24/13 2127  . thiamine (B-1) injection 100 mg  100 mg Intramuscular Once Clarene Reamer, MD      . thiamine (VITAMIN B-1) tablet 100 mg  100 mg Oral Daily Clarene Reamer, MD   100 mg at 12/25/13 0826  .  traZODone (DESYREL) tablet 200 mg  200 mg Oral QHS Clarene Reamer, MD   200 mg at 12/24/13 2127  . venlafaxine XR (EFFEXOR-XR) 24 hr capsule 150 mg  150 mg Oral Q breakfast Nicholaus Bloom, MD   150 mg at 12/25/13 0825    Lab Results: No results found for this or any previous visit (from the past 58 hour(s)).  Physical Findings: AIMS: Facial and Oral Movements Muscles of Facial Expression: None, normal Lips and Perioral Area: None, normal Jaw: None, normal Tongue: None, normal,Extremity Movements Upper (arms, wrists, hands, fingers): None, normal Lower (legs, knees, ankles, toes): None, normal, Trunk Movements Neck, shoulders, hips: None, normal, Overall Severity Severity of abnormal movements (highest score from questions above): None, normal Incapacitation due to abnormal movements: None, normal Patient's awareness of abnormal movements (rate only patient's report): No Awareness, Dental Status Current problems with teeth and/or dentures?: No Does patient usually wear dentures?: No  CIWA:  CIWA-Ar Total: 3 COWS:     Treatment Plan Summary: Daily contact with patient to assess and evaluate symptoms and progress in treatment Medication management  Plan: Supportive approach/coping skills/relapse prevention           Complete the detox           Reassess and address the co morbidities            Address the constipation Medical Decision Making Problem Points:  Review of psycho-social stressors (1) Data Points:  Review of medication regiment & side effects (2)  I certify that inpatient services furnished can reasonably be expected to improve the patient's condition.   Shenicka Sunderlin A 12/25/2013, 1:57 PM

## 2013-12-25 NOTE — Progress Notes (Addendum)
Pt is cooperative and attended group on the 500 hall. Pt stated she has not had a BM since Tuesday and at home takes ex lax. Pt did take mag citrate earlier but has not had a BM yet. Pt does contract for safety and denies Si and HI. Pt stated she rates her depression and hopelessness low today. Her energy level is high. 5:20pm-Pt was given a dulcolax supp. In the hopes of going to the bathroom.

## 2013-12-25 NOTE — Progress Notes (Signed)
D.  Pt pleasant on approach, concerned because after all the medications she took today for constipation, she then had diarrhea.  Pt states that she would like to be on a regular stool softener so that she can hopefully avoid this issue in the future.  Denies SI/HI/hallucinations at this time.  Positive for evening AA group, interacting appropriately with peers on the unit.  A.  Support and encouragement offered  R.  Pt remains safe on unit, will continue to monitor.

## 2013-12-25 NOTE — Progress Notes (Signed)
D.  Pt pleasant on approach, did report constipation and stated that Dulcolax and MOM would not help her.  No other complaints voiced.  Pt positive for evening group with appropriate participation.  Interacting appropriately with peers on unit.  A. Called PA and received order for Mag Citrate to be given in AM.  Support and encouragement offered  R.  PT pleased with new order.  PT remains safe on unit, will continue to monitor.

## 2013-12-25 NOTE — BHH Group Notes (Signed)
[facility] Group Notes:  (Nursing/MHT/Case Management/Adjunct)  Date:  12/25/2013  Time:  0900  Type of Therapy:  Self-Inventory Nursing Group  Participation Level:  Did Not Attend     Catalina Gravel 12/25/2013, 10:40 AM

## 2013-12-25 NOTE — Progress Notes (Signed)
Adult Psychoeducational Group Note  Date:  12/25/2013 Time:  10:14 PM  Group Topic/Focus:  Wrap-Up Group:   The focus of this group is to help patients review their daily goal of treatment and discuss progress on daily workbooks.  Participation Level:  Active  Participation Quality:  Appropriate  Affect:  Appropriate  Cognitive:  Appropriate  Insight: Appropriate  Engagement in Group:  Engaged  Modes of Intervention:  Discussion  Additional Comments:  Patient attended wrap up group with AA and peers. This group consisted of discussions.   Juluis Rainier Lyn 12/25/2013, 10:14 PM

## 2013-12-25 NOTE — BHH Group Notes (Signed)
[facility] Group Notes:  (Clinical Social Work) 500 hall group 12/25/2013   1:15-2:15PM  Summary of Progress/Problems:  The main focus of today's process group was to   identify the patient's current support system and decide on other supports that can be put in place.  The picture on workbook was used to discuss why additional supports are needed.  An emphasis was placed on using counselor, doctor, therapy groups, 12-step groups, and problem-specific support groups to expand supports.   There was also an extensive discussion about what constitutes a healthy support versus an unhealthy support.  The patient expressed full comprehension of the concepts presented.  She stated that her motivation to add supports and change her life is absolutely 10 out of 10.  She stated she had been sober 4 years prior to relapsing.  Her plan to is to go to [facility] for 21 days, then find housing.  She may go to [place] to a halfway house because that worked for her in the past.  She wants to continuing with her therapist, and make sure her medications are correct.d  Type of Therapy:  Process Group  Participation Level:  Active  Participation Quality:  Attentive and Sharing  Affect:  Blunted  Cognitive:  Appropriate and Oriented  Insight:  Engaged  Engagement in Therapy:  Engaged  Modes of Intervention:  Education,  Support and [employer], LCSW 12/25/2013, 4:00pm

## 2013-12-25 NOTE — BHH Group Notes (Signed)
[facility] Group Notes:  Healthy coping skills  Date:  12/25/2013  Time:  2:13 PM  Type of Therapy:  Nurse Education  Participation Level:  Did Not Attend  Participation Quality:  Inattentive  Affect:  Flat  Cognitive:  Lacking  Insight:  None  Engagement in Group:  None  Modes of Intervention:  Education  Summary of Progress/Problems:Pt did not attend the 300 hall group-She did attend the 500 hall group  Marcello Moores [facility] 12/25/2013, 2:13 PM

## 2013-12-26 MED ORDER — TRAZODONE HCL 100 MG PO TABS: 200.0000 mg | ORAL_TABLET | Freq: Every day | ORAL | Status: DC

## 2013-12-26 MED ORDER — DSS 100 MG PO CAPS: 100.0000 mg | ORAL_CAPSULE | Freq: Two times a day (BID) | ORAL | Status: DC

## 2013-12-26 MED ORDER — PROPRANOLOL HCL 10 MG PO TABS: 10.0000 mg | ORAL_TABLET | Freq: Two times a day (BID) | ORAL | Status: DC

## 2013-12-26 MED ORDER — VENLAFAXINE HCL ER 150 MG PO CP24: 150.0000 mg | ORAL_CAPSULE | Freq: Every day | ORAL | Status: DC

## 2013-12-26 MED ORDER — GABAPENTIN 400 MG PO CAPS: 800.0000 mg | ORAL_CAPSULE | Freq: Three times a day (TID) | ORAL | Status: DC

## 2013-12-26 MED ORDER — QUETIAPINE FUMARATE 25 MG PO TABS: 25.0000 mg | ORAL_TABLET | Freq: Four times a day (QID) | ORAL | Status: DC | PRN

## 2013-12-26 NOTE — BHH Group Notes (Signed)
[place] LCSW Group Therapy  12/26/2013 2:53 PM  Type of Therapy:  Group Therapy  Participation Level:  Active  Participation Quality:  Attentive  Affect:  Appropriate  Cognitive:  Alert and Oriented  Insight:  Engaged  Engagement in Therapy:  Improving  Modes of Intervention:  Confrontation, Discussion, Education, Exploration, Problem-solving, Rapport Building, Socialization and Support  Summary of Progress/Problems: Today's Topic: Overcoming Obstacles. Pt identified obstacles faced currently and processed barriers involved in overcoming these obstacles. Pt identified steps necessary for overcoming these obstacles and explored motivation (internal and external) for facing these difficulties head on. Pt further identified one area of concern in their lives and chose a skill of focus pulled from their "toolbox." [name] was attentive and engaged throughout today's therapy group. She shared that her biggest obstacle involves her home environment. "I can't go back to my daughter's home so after [facility], I am homeless." [name] shows progress in the group setting and improving insight AEB her ability to process how working on permanent housing now and while at [facility] will benefit her versus waiting until the last minute. Pt was open to receiving [employer] low income housing resources and an [employer], as she reported doing well when living in an [place] in the past.    Smart, [name]  12/26/2013, 2:53 PM

## 2013-12-26 NOTE — Progress Notes (Signed)
[facility] Adult Case Management Discharge Plan :  Will you be returning to the same living situation after discharge: No. Pt accepted to [facility] for tomorrow at 11am At discharge, do you have transportation home?:Yes,  pt's son will pick her up at 8:00AM on Tuesday 7/7 for transport to [place] you have the ability to pay for your medications:Yes,  Medicare  Release of information consent forms completed and submitted to Medical Records by CSW.  Patient to Follow up at: Follow-up Information   Follow up with [facility]. (Arrive at [facility] no later than 11:00AM for admission. )    Contact information:   [address], [place] [address] Phone: [phone] Fax: [phone]      Patient denies SI/HI:   Yes,  during admission, group, and self report.     Safety Planning and Suicide Prevention discussed:  Yes,  SPE not required for this pt as she did not endorse SI during admission or during stay at [name]. SPI pamphlet provided to pt and she was encouraged to share information with support network, ask questions, and talk about any concerns relating to SPE.  Smart, Malikah Lakey LCSWA  12/26/2013, 3:26 PM

## 2013-12-26 NOTE — Progress Notes (Signed)
D   Pt is appropriate and pleasant   She reports she is looking forward to being discharged and going to long term treatment   She reports feeling more positive and hopeful for maintaining her sobriety   She is cooperative and interactive with other pts and is active in group A   Verbal support given   Medications administered and effectiveness monitored   Q 15 min checks   Discussed rehab with pt  R  Pt is safe at present

## 2013-12-26 NOTE — Progress Notes (Signed)
D) Pt. Affect brighter this evening.  Pt. Reports she is being d/c tomorrow and states she is feeling "ready to go".  Pt. Also reports decrease in symptoms of w/d and appears to be having no distress at this time. A) Support offered.  R) Pt. Receptive and continues to interact appropriately in the milieu.

## 2013-12-26 NOTE — Clinical Social Work Note (Signed)
Per Dr. Sabra Heck, Pt IVC'ed-in need of transport to [place] and high risk for relapse. IVC paperwork delivered. In group, pt stated that her son will pick her up tomorrow at 8am. IVC will cancel at d/c tomorrow. CSW called SGT [facility] to inform him that NO TRANSPORTATION IS NEEDED FOR PT. She is scheduled for 8am d/c and her son will pick her up. CSW called and left message for UR dept of [place] in attempt to get Auth number for [facility]. Adam Phenix is out of office until tomorrow morning. CSW left message for another UR rep Solmon Ice) and with her supervisor requesting call back asap in order to get auth number. CSW left message with Fomi, intake coordinator at [facility] to share above information and let her know that we should have auth number by tomorrow morning. Pt scheudled for 11am admission at [facility].   [place], LCSWA 12/26/2013 3:24 PM

## 2013-12-26 NOTE — Clinical Social Work Note (Signed)
CSW called [facility] to check referral. CSW spoke with Fomi (intake coordinator). She is in process of reviewing referral and will call back once reviewed.  [place], [place] 12/26/2013 11:09 AM
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
## 2013-12-26 NOTE — Progress Notes (Signed)
Adult Psychoeducational Group Note  Date:  12/26/2013 Time:  10:00 am  Group Topic/Focus:  Wellness Toolbox:   The focus of this group is to discuss various aspects of wellness, balancing those aspects and exploring ways to increase the ability to experience wellness.  Patients will create a wellness toolbox for use upon discharge.  Participation Level:  Active  Participation Quality:  Appropriate, Sharing and Supportive  Affect:  Appropriate  Cognitive:  Appropriate  Insight: Appropriate  Engagement in Group:  Engaged  Modes of Intervention:  Discussion, Education, Socialization and Support  Additional Comments:  Pt stated that she is planning to attend a 14 day treatment program upon release. Pt stated that finding stable housing is also something she needs to do to promote healthy living. Pt stated that the barriers to her progress are barriers alcohol and a toxic relationship with her daughter.   Anny Sayler 12/26/2013, 11:15 AM

## 2013-12-26 NOTE — [facility] Group Notes (Signed)
[facility] LCSW Aftercare Discharge Planning Group Note   12/26/2013 10:03 AM  Participation Quality:  Appropriate   Mood/Affect:  Appropriate  Depression Rating:  0  Anxiety Rating:  2  Thoughts of Suicide:  No Will you contract for safety?   NA  Current AVH:  No  Plan for Discharge/Comments:  Pt reports that she is hoping for [facility] referral and would like CSW to help her call [facility] again this afternoon. CSW assessing and is attempting to make contact with [facility] referral dept this morning to check status. Pt reporting moderate withdrawals including tremors and chills.   Transportation Means: son/church family   Supports: son/church family   Smart, [name]

## 2013-12-26 NOTE — Progress Notes (Signed)
D) Pt. Affect blunted, mood appears depressed. Appears mildly anxious.    Pt. Reported chilling, cravings, tremors, agitation on self inventory this am,  But reported a reduction in symptoms at 1200.  Minimal hand tremor noted. No other complaints offered at 1200. Pt's BP. Initially taken with low results, but once taken with appropriate sized BP cuff and taken manually, BP 110/60 sitting, and 100/70 standing.  Pt. Reports frustration over having to detox again, and expressed knowing that things will get better because pt. Has been through this before. A) Pt. Offered support and comfort measures.  R) Pt. Receptive and cooperative on unit.  Denies SI/HI at this time and remains on q 15 min. Observations.

## 2013-12-26 NOTE — Progress Notes (Signed)
Patient ID: [name], female   DOB: [birth year], 58 y.o.   MRN: [id number] PER STATE REGULATIONS 482.30  THIS CHART WAS REVIEWED FOR MEDICAL NECESSITY WITH RESPECT TO THE PATIENT'S ADMISSION/ DURATION OF STAY.  NEXT REVIEW DATE: 12/29/2013  Chauncy Lean, RN, BSN CASE MANAGER

## 2013-12-26 NOTE — [facility] Suicide Risk Assessment (Signed)
Suicide Risk Assessment  Discharge Assessment     Demographic Factors:  Caucasian, divorced  Total Time spent with patient: 45 minutes  Psychiatric Specialty Exam:     Blood pressure 118/64, pulse 66, temperature 98.2 F (36.8 C), temperature source Oral, resp. rate 16, height 5\' 4"  (1.626 m), weight 67.586 kg (149 lb), SpO2 96.00%.Body mass index is 25.56 kg/(m^2).  General Appearance: Fairly Groomed  Engineer, water::  Fair  Speech:  Clear and Coherent  Volume:  Normal  Mood:  Euthymic  Affect:  Appropriate  Thought Process:  Coherent and Goal Directed  Orientation:  Full (Time, Place, and Person)  Thought Content:  Plans as she moves on, relapse prevention plan  Suicidal Thoughts:  No  Homicidal Thoughts:  No  Memory:  Immediate;   Fair Recent;   Fair Remote;   Fair  Judgement:  Fair  Insight:  Present  Psychomotor Activity:  Normal  Concentration:  Fair  Recall:  [employer] of Knowledge:NA  Language: Fair  Akathisia:  No  Handed:    AIMS (if indicated):     Assets:  Desire for Improvement Social Support  Sleep:  Number of Hours: 5    Musculoskeletal: Strength & Muscle Tone: within normal limits Gait & Station: normal Patient leans: N/A   Mental Status Per Nursing Assessment::   On Admission:     Current Mental Status by Physician: In full contact with reality. There are no active S/S of withdrawal. Her mood is euthymic, her affect is appropriate. She is willing and motivated to pursue residential treatment and long term sobriety  Loss Factors: Loss of significant relationship  Historical Factors: NA  Risk Reduction Factors:   Sense of responsibility to family and Positive social support  Continued Clinical Symptoms:  Depression:   Comorbid alcohol abuse/dependence Alcohol/Substance Abuse/Dependencies  Cognitive Features That Contribute To Risk:  Closed-mindedness Polarized thinking Thought constriction (tunnel vision)    Suicide Risk:   Minimal: No identifiable suicidal ideation.  Patients presenting with no risk factors but with morbid ruminations; may be classified as minimal risk based on the severity of the depressive symptoms  Discharge Diagnoses:   AXIS I:  Alcohol dependence, Major Depression recurrent, PTSD,GAD AXIS II:  No diagnosis AXIS III:   Past Medical History  Diagnosis Date  . Depression    AXIS IV:  other psychosocial or environmental problems AXIS V:  61-70 mild symptoms  Plan Of Care/Follow-up recommendations:  Activity:  as tolerated Diet:  regualar Follow up [facility] Is patient on multiple antipsychotic therapies at discharge:  No   Has Patient had three or more failed trials of antipsychotic monotherapy by history:  No  Recommended Plan for Multiple Antipsychotic Therapies: NA    [name] A 12/26/2013, 8:20 PM
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
## 2013-12-27 NOTE — Progress Notes (Signed)
Pt d/c from the hospital with her son to attend a treatment program in [place]. All items returned. D/C instructions given, prescriptions given and samples given. Pt denies si and hi.

## 2013-12-27 NOTE — Plan of Care (Signed)
Problem: Ineffective individual coping Goal: LTG: Patient will report a decrease in negative feelings Outcome: Completed/Met Date Met:  12/27/13 Pt denies si and hi Goal: STG: Patient will remain free from self harm Outcome: Completed/Met Date Met:  12/27/13 Pt denies si  Problem: Consults Goal: Substance Abuse Patient Education See Patient Education Module for education specifics.  Outcome: Completed/Met Date Met:  12/27/13 Pt completed [facility] and will go to a longer treatment program following discharge.  Problem: Alteration in mood & ability to function due to Goal: LTG-Pt reports reduction in suicidal thoughts (Patient reports reduction in suicidal thoughts and is able to verbalize a safety plan for whenever patient is feeling suicidal)  Outcome: Not Applicable Date Met:  95/74/73 Pt denies si

## 2013-12-29 NOTE — Progress Notes (Signed)
Patient Discharge Instructions:  After Visit Summary (AVS):   Faxed to:  12/29/13 Discharge Summary Note:   Faxed to:  12/29/13 Psychiatric Admission Assessment Note:   Faxed to:  12/29/13 Suicide Risk Assessment - Discharge Assessment:   Faxed to:  12/29/13 Faxed/Sent to the Next Level Care provider:  12/29/13 Faxed to [facility] @ [phone]  [name], 12/29/2013, 3:46 PM

## 2014-04-29 LAB — URINALYSIS, COMPLETE
Bilirubin,UR: NEGATIVE
Blood: NEGATIVE
GLUCOSE, UR: NEGATIVE mg/dL (ref 0–75)
Ketone: NEGATIVE
Leukocyte Esterase: NEGATIVE
NITRITE: NEGATIVE
PROTEIN: NEGATIVE
Ph: 5 (ref 4.5–8.0)
RBC,UR: 1 /HPF (ref 0–5)
SPECIFIC GRAVITY: 1.002 (ref 1.003–1.030)
Squamous Epithelial: 1
WBC UR: 2 /HPF (ref 0–5)

## 2014-04-29 LAB — DRUG SCREEN, URINE
AMPHETAMINES, UR SCREEN: NEGATIVE (ref ?–1000)
Barbiturates, Ur Screen: NEGATIVE (ref ?–200)
Benzodiazepine, Ur Scrn: NEGATIVE (ref ?–200)
COCAINE METABOLITE, UR ~~LOC~~: NEGATIVE (ref ?–300)
Cannabinoid 50 Ng, Ur ~~LOC~~: NEGATIVE (ref ?–50)
MDMA (ECSTASY) UR SCREEN: NEGATIVE (ref ?–500)
METHADONE, UR SCREEN: NEGATIVE (ref ?–300)
OPIATE, UR SCREEN: NEGATIVE (ref ?–300)
Phencyclidine (PCP) Ur S: NEGATIVE (ref ?–25)
Tricyclic, Ur Screen: NEGATIVE (ref ?–1000)

## 2014-04-29 LAB — CBC
HCT: 42.2 % (ref 35.0–47.0)
HGB: 14.2 g/dL (ref 12.0–16.0)
MCH: 30.5 pg (ref 26.0–34.0)
MCHC: 33.8 g/dL (ref 32.0–36.0)
MCV: 91 fL (ref 80–100)
PLATELETS: 234 10*3/uL (ref 150–440)
RBC: 4.66 10*6/uL (ref 3.80–5.20)
RDW: 15 % — ABNORMAL HIGH (ref 11.5–14.5)
WBC: 9.4 10*3/uL (ref 3.6–11.0)

## 2014-04-29 LAB — COMPREHENSIVE METABOLIC PANEL
ANION GAP: 6 — AB (ref 7–16)
Albumin: 3.4 g/dL (ref 3.4–5.0)
Alkaline Phosphatase: 84 U/L
BUN: 8 mg/dL (ref 7–18)
Bilirubin,Total: 0.3 mg/dL (ref 0.2–1.0)
CALCIUM: 8.4 mg/dL — AB (ref 8.5–10.1)
CO2: 27 mmol/L (ref 21–32)
CREATININE: 0.7 mg/dL (ref 0.60–1.30)
Chloride: 105 mmol/L (ref 98–107)
EGFR (Non-African Amer.): >60
Glucose: 88 mg/dL (ref 65–99)
OSMOLALITY: 273 (ref 275–301)
Potassium: 3.9 mmol/L (ref 3.5–5.1)
SGOT(AST): 23 U/L (ref 15–37)
SGPT (ALT): 21 U/L
Sodium: 138 mmol/L (ref 136–145)
Total Protein: 7.1 g/dL (ref 6.4–8.2)

## 2014-04-29 LAB — ACETAMINOPHEN LEVEL

## 2014-04-29 LAB — ETHANOL: Ethanol: 162 mg/dL

## 2014-04-29 LAB — SALICYLATE LEVEL: Salicylates, Serum: 3.1 mg/dL — ABNORMAL HIGH

## 2014-05-02 ENCOUNTER — Inpatient Hospital Stay: Payer: Self-pay | Admitting: Psychiatry

## 2014-05-31 LAB — URINALYSIS, COMPLETE
BACTERIA: NONE SEEN
BILIRUBIN, UR: NEGATIVE
Glucose,UR: NEGATIVE mg/dL (ref 0–75)
Ketone: NEGATIVE
LEUKOCYTE ESTERASE: NEGATIVE
Nitrite: NEGATIVE
Ph: 5 (ref 4.5–8.0)
Protein: NEGATIVE
RBC,UR: <1 /HPF (ref 0–5)
SPECIFIC GRAVITY: 1.006 (ref 1.003–1.030)

## 2014-05-31 LAB — COMPREHENSIVE METABOLIC PANEL
ALBUMIN: 3.6 g/dL (ref 3.4–5.0)
Alkaline Phosphatase: 83 U/L
Anion Gap: 11 (ref 7–16)
BILIRUBIN TOTAL: 0.2 mg/dL (ref 0.2–1.0)
BUN: 11 mg/dL (ref 7–18)
CO2: 24 mmol/L (ref 21–32)
CREATININE: 0.69 mg/dL (ref 0.60–1.30)
Calcium, Total: 8.4 mg/dL — ABNORMAL LOW (ref 8.5–10.1)
Chloride: 107 mmol/L (ref 98–107)
EGFR (African American): >60
GLUCOSE: 107 mg/dL — AB (ref 65–99)
Osmolality: 283 (ref 275–301)
Potassium: 4.6 mmol/L (ref 3.5–5.1)
SGOT(AST): 40 U/L — ABNORMAL HIGH (ref 15–37)
SGPT (ALT): 40 U/L
Sodium: 142 mmol/L (ref 136–145)
TOTAL PROTEIN: 7.5 g/dL (ref 6.4–8.2)

## 2014-05-31 LAB — CBC
HCT: 44.1 % (ref 35.0–47.0)
HGB: 14.6 g/dL (ref 12.0–16.0)
MCH: 30.8 pg (ref 26.0–34.0)
MCHC: 33.1 g/dL (ref 32.0–36.0)
MCV: 93 fL (ref 80–100)
Platelet: 192 10*3/uL (ref 150–440)
RBC: 4.74 10*6/uL (ref 3.80–5.20)
RDW: 15.9 % — AB (ref 11.5–14.5)
WBC: 9.1 10*3/uL (ref 3.6–11.0)

## 2014-05-31 LAB — TROPONIN I: Troponin-I: <0.02 ng/mL

## 2014-05-31 LAB — DRUG SCREEN, URINE

## 2014-05-31 LAB — SALICYLATE LEVEL: SALICYLATES, SERUM: 3 mg/dL — AB

## 2014-05-31 LAB — ACETAMINOPHEN LEVEL

## 2014-05-31 LAB — ETHANOL: ETHANOL LVL: 299 mg/dL

## 2014-06-01 ENCOUNTER — Inpatient Hospital Stay: Payer: Self-pay | Admitting: Psychiatry

## 2014-10-11 LAB — URINALYSIS, COMPLETE
Bacteria: NONE SEEN
Bilirubin,UR: NEGATIVE
Glucose,UR: NEGATIVE mg/dL (ref 0–75)
KETONE: NEGATIVE
Leukocyte Esterase: NEGATIVE
Nitrite: NEGATIVE
Ph: 6 (ref 4.5–8.0)
Protein: NEGATIVE
SPECIFIC GRAVITY: 1.002 (ref 1.003–1.030)

## 2014-10-11 LAB — DRUG SCREEN, URINE
AMPHETAMINES, UR SCREEN: NEGATIVE
BENZODIAZEPINE, UR SCRN: POSITIVE
Barbiturates, Ur Screen: NEGATIVE
Cannabinoid 50 Ng, Ur ~~LOC~~: NEGATIVE
Cocaine Metabolite,Ur ~~LOC~~: POSITIVE
MDMA (ECSTASY) UR SCREEN: NEGATIVE
Methadone, Ur Screen: NEGATIVE
Opiate, Ur Screen: NEGATIVE
Phencyclidine (PCP) Ur S: NEGATIVE
Tricyclic, Ur Screen: NEGATIVE

## 2014-10-11 LAB — COMPREHENSIVE METABOLIC PANEL
ALBUMIN: 4.3 g/dL
AST: 29 U/L
Alkaline Phosphatase: 66 U/L
Anion Gap: 11 (ref 7–16)
BILIRUBIN TOTAL: 0.5 mg/dL
BUN: 14 mg/dL
Calcium, Total: 9.1 mg/dL
Chloride: 101 mmol/L
Co2: 27 mmol/L
Creatinine: 0.71 mg/dL
Glucose: 85 mg/dL
Potassium: 4.1 mmol/L
SGPT (ALT): 18 U/L
Sodium: 139 mmol/L
Total Protein: 7.7 g/dL

## 2014-10-11 LAB — ETHANOL: ETHANOL LVL: 134 mg/dL

## 2014-10-11 LAB — CBC
HCT: 43.7 % (ref 35.0–47.0)
HGB: 14.7 g/dL (ref 12.0–16.0)
MCH: 30.7 pg (ref 26.0–34.0)
MCHC: 33.5 g/dL (ref 32.0–36.0)
MCV: 92 fL (ref 80–100)
PLATELETS: 269 10*3/uL (ref 150–440)
RBC: 4.78 10*6/uL (ref 3.80–5.20)
RDW: 15.4 % — ABNORMAL HIGH (ref 11.5–14.5)
WBC: 10.5 10*3/uL (ref 3.6–11.0)

## 2014-10-11 LAB — SALICYLATE LEVEL

## 2014-10-11 LAB — ACETAMINOPHEN LEVEL

## 2014-10-13 ENCOUNTER — Inpatient Hospital Stay
Admit: 2014-10-13 | Disposition: A | Discharge status: Home / Self Care | Payer: Self-pay | Attending: Psychiatry | Admitting: Psychiatry

## 2014-10-13 NOTE — Consult Note (Signed)
Brief Consult Note: Diagnosis: Bipolar Disorder, Alcohol Dependence.   Patient was seen by consultant.   Consult note dictated.   Orders entered.   Comments: Pt is voluntary.  Meds adjusted.  She denied SI/HI or plans.  She will be transferred to [facility] for rehab and help with her drug use. Pt agreed with the plan.  Electronic Signatures: Jeronimo Norma (MD)  (Signed 30-Jan-14 11:00)  Authored: Brief Consult Note   Last Updated: 30-Jan-14 11:00 by Jeronimo Norma (MD)
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
## 2014-10-14 NOTE — Consult Note (Signed)
PATIENT NAME:  [name], [name] MR#:  [id number] DATE OF BIRTH:  1955/10/27  DATE OF CONSULTATION:  05/02/2014  REFERRING PHYSICIAN:   CONSULTING PHYSICIAN:  Gonzella Lex, MD  IDENTIFYING INFORMATION AND REASON FOR CONSULTATION: A 59 year old woman with a history of alcohol abuse and anxiety and depression, came into the Emergency Room and was making suicidal statements causing her to have an involuntary commitment filed.   CHIEF COMPLAINT: "I've been self-medicating."   HISTORY OF PRESENT ILLNESS: Information obtained from the patient and the chart. She says that for about the last 2 weeks she has been back to drinking heavily. Prior to that, she said she had been sober for several months. She says she has been drinking heavily to try and deal with the anxiety of not having a clear place to stay and being socially unstable. Also being off medication. She denies that she has been abusing other drugs. She says she had been prescribed Inderal, Neurontin and trazodone and used to be taking Effexor and then Pristiq but thought those medicines made her feel worse. She recently relocated from [facility] to [place] and does not have anyone set up here for treatment yet. She feels like she is getting out of control with her anxiety. She describes her herself several times as drinking to "self medicate" and knows that that is not good for her. She is currently stating that she does not have any intention to actually hurt herself but has been feeling out of control. Not reporting any acute psychotic symptoms.   PAST PSYCHIATRIC HISTORY: She just got out of [facility] in the summer. Does have a past history of psychiatric hospitalization. Has been diagnosed with borderline personality disorder in the past. Has been prescribed medication, not clear that any of them have really worked for very long. It does not sound like she has a clear history of psychosis.   SOCIAL HISTORY: Currently  just has a friend that she is staying with here locally. It sounds somewhat unstable. Not able to work, she is disabled. Has family and children, but it sounds like she is in only occasional contact with them.   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   CURRENT MEDICATIONS: Neurontin 800 mg 3 times a day, trazodone 300 mg at night, Inderal 10 mg 3 times a day.   ALLERGIES: ALL SPORINS, BENADRYL AND CEPHALOSPORIN.   FAMILY HISTORY: Says she has a sister with mental health problems.   SUBSTANCE ABUSE HISTORY: History of alcohol abuse. Denies any history of seizures coming off alcohol. Does still seem pretty shaky right now. Denies that she has been abusing other drugs.   REVIEW OF SYSTEMS: Still feeling anxious and shaky. No acute suicidal ideation. No hallucinations. Feels nervous and unstable and unsafe. No other acute physical complaints.   MENTAL STATUS EXAMINATION: Disheveled woman, looks her stated age, cooperative. She has a tremor all over talking to her. Eye contact decreased. Psychomotor activity shaky. Speech decreased in total amount. Affect nervous. Mood stated as being nervous. Thoughts are generally lucid. No evidence of delusional thinking. Denies auditory or visual hallucinations. Denies suicidal or homicidal intent currently. Can recall 3/3 objects immediately, 2/3 at three minutes. She is alert and oriented x 4. Judgment and insight are improving.   LABORATORY RESULTS: Drug screen is all negative. Chemistry panel: Slightly low calcium. Alcohol level on admission, which was back on the seventh  was 162. CBC is all unremarkable. Urinalysis unremarkable.   VITAL SIGNS: Current blood pressure is 94/52, respirations 20, pulse 100, temperature 97.9.   ASSESSMENT: This is a 59 year old woman with alcohol dependence, who is continuing to be shaky despite getting Librium today. Still feeling nervous,  unsafe and unstable. Minimal outpatient support. Does not sound like she has a really stable place to stay. No telling when we would get a referral to the [facility], I offered the patient admission to the hospital for stabilization and continued detoxification, which she was agreeable to.   TREATMENT PLAN: Admit to psychiatry. Detoxification orders in place. Continue her Neurontin and trazodone. Supportive and educational counseling done. Suicide precautions as well as seizure precautions in place.   DIAGNOSIS, PRINCIPAL AND PRIMARY:  AXIS I: Depression, not otherwise specified.   SECONDARY DIAGNOSES:  AXIS I: Alcohol abuse, severe.  AXIS II: Borderline personality disorder by history.   ____________________________ Gonzella Lex, MD jtc:TT Gray: 05/02/2014 17:27:51 ET T: 05/02/2014 17:57:26 ET JOB#: [id number]  cc: Gonzella Lex, MD, <Dictator> Gonzella Lex MD ELECTRONICALLY SIGNED 05/08/2014 17:03

## 2014-10-14 NOTE — Consult Note (Signed)
PATIENT NAME:  [name], [name] MR#:  [id number] DATE OF BIRTH:  1955-08-01  DATE OF CONSULTATION:  05/31/2014  REFERRING PHYSICIAN:   CONSULTING PHYSICIAN:  Gonzella Lex, MD  IDENTIFYING INFORMATION AND REASON FOR CONSULTATION: A 59 year old woman with a history of alcohol abuse and depression, returns to the hospital under involuntary commitment after taking an intentional overdose of trazodone and stating she had suicidal ideation.   HISTORY OF PRESENT ILLNESS: Information obtained from the patient and the chart. The police picked her up and she had said that she took at least 500 mg of trazodone and told them that she wanted to die. The patient tells me that she has been back drinking ever since she was here last. She cannot even calculate how much it is. She has been feeling sick to her stomach, barely eating sleeping poorly and intermittently. She decided that she is sick of it and that she needs to stop. She has the possibility of getting a living arrangement of her own in [place], but only if she can stay sober. Her mood has been depressed. She has had suicidal thoughts and wishes to die. She has felt panicky much of the time. She is not taking the Prozac that she was prescribed when she was here last. She is still taking her trazodone, Neurontin and Inderal. Currently, she says that her heart is beating very fast and she feels like she is going to throw up. Denies that she has used any other drugs.   PAST PSYCHIATRIC HISTORY: Long history of alcohol dependence. Has a history of seizures, but not delirium tremens in the past. Has been able to maintain sobriety for long periods of time in the past, but not recently. Also, history of depression and anxiety disorder, probably panic disorder. She did well on Effexor for years, but then it was stopped when it was no longer effective. She was recently prescribed Prozac when she was in the hospital last time, but she says that made her "crazy." No history  of violence. No history of psychosis.   SOCIAL HISTORY: The patient is not married. She says that a friend of hers was going to get her into a place where she could live on her own in [place], but that she needs to get sober with it. Gets disability.   FAMILY HISTORY: Multiple members of her family, especially women, have problems with depression and anxiety disorders.   PAST MEDICAL HISTORY: Status post an amputation of the right arm. No other significant ongoing medical problems.   CURRENT MEDICATIONS: As of last discharge, she was taking naltrexone 50 mg a day, Prozac 10 mg a day, propranolol 10 mg 3 times a day, trazodone 300 mg at night, Neurontin 800 mg 3 times a day for pain. No longer taking the Prozac.   ALLERGIES: ALL SPORINS, BENADRYL AND CEPHALOSPORIN.   REVIEW OF SYSTEMS: Mainly complaining of feeling like her heart is beating fast and that she is sick to her stomach and feels like she is going to throw up. Also, depressed, suicidal ideation. No homicidal ideation. No psychotic symptoms. Tired.   MENTAL STATUS EXAMINATION: Disheveled woman, looks her stated age. Passively cooperative with the interview. Required a lot of effort to get her to wake up and then she would only participate to a minimum. Made no eye contact. Psychomotor activity virtually nonexistent. Speech decreased in total amount, flat in tone. Affect is flat. Mood stated as depressed. Thoughts are lucid without loosening of associations or delusions.  Denies auditory or visual hallucinations. Endorses suicidal ideation and a wish to die. No homicidal ideation. Can remember 3/3 objects immediately, 2/3 at three minutes. She is alert and oriented x 4. Judgment and insight adequate.   VITAL SIGNS: Blood pressure 111/57, respirations 17, pulse 104, temperature 98.4.   LABORATORY RESULTS: Chemistry showed elevated glucose 107, low calcium 8.4, elevated AST at 40. Alcohol level was 299 at 2:30. CBC is unremarkable. Salicylates  elevated at 3.0.   ASSESSMENT: A 59 year old woman with a history of alcohol dependence presents intoxicated, suicidal and depressed. Positive history of seizures in the past. History of suicide attempt, needs hospitalization for safety.   TREATMENT PLAN: Admit to psychiatry. Alcohol detoxification protocol in place. Loading dose of Librium done. I am going to restart her on Effexor 37.5 mg a day since that had been effective for her in the past. Continue her other outpatient medicines.   DIAGNOSIS, PRINCIPAL AND PRIMARY:  AXIS I: Major depression, recurrent, severe.   SECONDARY DIAGNOSES:  AXIS I: Alcohol abuse, severe.  AXIS II: Deferred.  AXIS III: Alcohol withdrawal.    ____________________________ Gonzella Lex, MD jtc:TT Gray: 05/31/2014 17:22:46 ET T: 05/31/2014 17:55:55 ET JOB#: [id number]  cc: Gonzella Lex, MD, <Dictator> Gonzella Lex MD ELECTRONICALLY SIGNED 06/04/2014 11:20
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
## 2014-10-18 NOTE — H&P (Signed)
PATIENT NAME:  [name], [name] MR#:  [id number] DATE OF BIRTH:  May 22, 1956  DATE OF ADMISSION:  06/01/2014  REFERRING PHYSICIAN: Emergency Room MD   ATTENDING PHYSICIAN: Orson Slick, MD   IDENTIFYING DATA: [name] is a 59 year old female with history of alcoholism, depression, anxiety, and mood instability.   CHIEF COMPLAINT: "I overdosed."   HISTORY OF PRESENT ILLNESS: [name] has a long history of alcoholism. She was hospitalized for alcohol detox at [facility] exactly 1 month ago, on November 10th through 13th. She completed alcohol detox and was discharged on an antidepressants as well as naltrexone. Unfortunately, she was unable to follow up with substance abuse IUP program at [facility] as [facility] was not accepted there. She started drinking right away following discharge. She is unable to tell us how much she is drinking, but it is a lot. She became more depressed and despondent because she now has a chance to start living independently since she has disability, but she needs to maintain sobriety for that. Apparently, she spent her paycheck already and became increasingly depressed with poor sleep, decreased appetite, anhedonia, feeling of guilt, hopelessness, worthlessness, poor energy and concentration, crying spells, heightened anxiety with panic attacks, social isolation and suicidal ideation, but last night she called Sherrian Divers, a liaison community support worker at [employer] to tell him that she relapsed and was suicidal. He sent police over to her house. By the time police arrived she already took an overdose of 500 mg of trazodone. The patient denies psychotic symptoms. She denies, other than alcohol, substance use.  PAST PSYCHIATRIC HISTORY: There is prior hospitalization in [place] and at [place]. She did spend 2 weeks in a rehab center. She has never been able to maintain any reasonable period of sobriety. She had one prior suicide attempt.  She has not been compliant with her psychiatric treatment possibly due to problems with her insurance.   FAMILY PSYCHIATRIC HISTORY: Multiple females with depression, anxiety, borderline personality disorder, bipolar disorder, and at least 1 with substance abuse problems.   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   ALLERGIES: SPORINS AND BENADRYL.  MEDICATIONS ON ADMISSION: Neurontin 800 mg 3 times a day, propranolol 10 mg 3 times a day. She is noncompliant with Prozac and naltrexone. She overdosed on trazodone.   SOCIAL HISTORY: She currently lives with a friend, hopes to get her own place, but it has been more difficult that she anticipated due to inability to maintain sobriety. She relocated to [facility] from [place] area where she was kicked out from [employer] there. She is on disability, has Medicaid and apparently just today we learned that her Medicaid was transferred indeed to [facility].  REVIEW OF SYSTEMS:  CONSTITUTIONAL: No fevers or chills. No weight changes.  EYES: No double or blurred vision.  ENT: No hearing loss.  RESPIRATORY: No shortness of breath or cough.  CARDIOVASCULAR: No chest pain or orthopnea.  GASTROINTESTINAL: No abdominal pain, nausea, vomiting, or diarrhea.  GENITOURINARY: No incontinence or frequency.  ENDOCRINE: No heat or cold intolerance.  LYMPHATIC: No anemia or easy bruising.  INTEGUMENTARY: No acne or rash.  MUSCULOSKELETAL: Status post amputation of right arm.  NEUROLOGIC: No tingling or weakness.  PSYCHIATRIC: See history of present illness for details.   PHYSICAL EXAMINATION: VITAL SIGNS: Blood pressure 146/76, pulse 91, respirations 18, temperature 98.6.  GENERAL: This is a well-developed, middle-aged female in no acute distress.  HEENT: The  pupils are equal, round, and reactive to light. Sclerae are anicteric.  NECK: Supple. No thyromegaly.  LUNGS: Clear to auscultation. No dullness to percussion.   HEART: Regular rhythm and rate. No murmurs, rubs, or gallops.  ABDOMEN: Soft, nontender, nondistended. Positive bowel sounds.  MUSCULOSKELETAL: Status post amputation of right arm.  SKIN: No rashes or bruises.  LYMPHATIC: No cervical adenopathy.  NEUROLOGIC: Cranial nerves II through XII are intact.   DIAGNOSTIC DATA: Laboratory data: chemistries within normal limits. Blood alcohol level 299. LFTs within normal limits. Urine tox screen negative for substances. CBC within normal limits. Urinalysis is not suggestive of urinary tract infection. Serum acetaminophen and salicylates are low.   EKG: Normal sinus rhythm, low voltage QRS.  MENTAL STATUS EXAMINATION ON ADMISSION: The patient is alert but somnolent and visibly uncomfy, shaking. She is pleasant, polite and cooperative. She maintains limited eye contact. Her speech is slow and slightly slurred. Mood is depressed with flat affect. There is severe psychomotor retardation. She still feels suicidal, despondent and hopeless. She denies delusions or paranoia. There are no auditory or visual hallucinations. Her cognition is grossly intact. Registration, recall, long and short-term memory are intact. She is of average intelligence and fund of knowledge. Her insight and judgment are limited.   SUICIDE RISK ASSESSMENT ON ADMISSION: This is a patient with long history of depression, anxiety, mood instability, suicide attempts, and substance abuse who just overdosed on medication in an attempt to kill herself. She is at increased risk of suicide.   INITIAL DIAGNOSES:  AXIS I:  1.  Major depressive disorder, recurrent, severe without psychotic features. 2.  Anxiety disorder.  3.  Alcohol use disorder, severe.  AXIS II: Deferred.  AXIS III: Alcohol withdrawal, amputated right arm.   PLAN: The patient was admitted to [facility] unit for safety, stabilization and medication management.  1.  Suicidal ideation:  Still suicidal but able to contract for safety in the hospital. 2.  Mood: She was restarted on Prozac in the Emergency Room. Since she does have a local Medicaid maybe there is a chance for compliance.  3.  Alcohol detox: She is on the [facility] protocol. We will probably add standing doses of Librium for the next 3 days.  4.  Substance abuse treatment: The patient is not interested in residential treatment, but probably needs it. She wants to follow up with [facility] as planned during previous hospitalization.  5.  Pain: We will continue Neurontin.  6.  Hypertension: We will continue propranolol, although it is unclear whether it is used as an antihypertensive or antianxiety medication. Will investigate. 7.  Disposition: Unclear. To be established. She will follow up with [facility] substance abuse IOP program. ____________________________ Wardell Honour. Bary Leriche, MD jbp:sb D: 06/01/2014 16:03:09 ET T: 06/01/2014 16:56:34 ET JOB#: [id number]  cc: Icie Kuznicki B. Bary Leriche, MD, <Dictator> Clovis Fredrickson MD ELECTRONICALLY SIGNED 06/26/2014 19:30

## 2014-10-22 NOTE — Consult Note (Addendum)
PATIENT NAME:  [name], DEMARIO D MR#:  [id number] DATE OF BIRTH:  1955/08/05  DATE OF CONSULTATION:  10/12/2014  REFERRING PHYSICIAN:   CONSULTING PHYSICIAN:  Gonzella Lex, MD  IDENTIFYING INFORMATION AND REASON FOR CONSULTATION: A 59 year old woman with a history of alcohol abuse and depression and panic attacks who comes to the hospital with a chief complaint "I got depressed and then I started drinking."   HISTORY OF PRESENT ILLNESS: Information from the patient and the chart. The patient says that she had been staying sober from December 2015 up until about 3 weeks ago. She started to get progressively more depressed with sad mood, hopelessness, passive suicidal thoughts. Also started to get more anxious with occasional panic attacks and generalized anxiety. She says these symptoms started first and then she relapsed into drinking. For the last 3 weeks she has been drinking about a 12 pack of beer a day. She also used cocaine once about 2 or 3 days ago and she says that is the first time she has done that in years. She started to have more intense thoughts about wanting to die thinking that maybe she could drink herself to death. These were all signals to her to come into the Emergency Room. Throughout this she has continued to take her outpatient psychiatric medicine and has stayed in contact with the community support team that comes around to see her at home. One stress is that she is now living completely by herself when she used to be living with a daughter and the isolation has been hard for her.   PAST PSYCHIATRIC HISTORY: Long history of alcohol abuse, depression, and anxiety. Has had some suicide attempts in the past but mostly also by substance abuse. She has had hospitalizations for detoxification and hospitalizations for stabilization of mood and anxiety. She is currently taking venlafaxine extended release 225 mg a day which had been helpful for her anxiety and depression. She has taken  other medicines in the past as well, which she found less helpful. She was last here in the hospital in December 2015.   SOCIAL HISTORY: Not working. Lives by herself in an apartment. Has adult children with whom she is an occasional contact. Pretty much estranged from all the rest of her family. She says when she is sober she does have social contact with people she enjoys spending time with.   FAMILY HISTORY: Positive for several people with alcohol abuse as well as even more prominently for depression and anxiety.   PAST MEDICAL HISTORY: The patient has no significant medical problems. Denies high blood pressure, heart disease, stroke, kidney disease, diabetes, etc.   CURRENT MEDICATIONS: Venlafaxine extended release 225 mg per day, trazodone 100 mg p.o. at bedtime, gabapentin 800 mg 3 times a day, Inderal 10 mg 3 times a day.   ALLERGIES: "SPORINS," BENADRYL.   SUBSTANCE ABUSE HISTORY: Long history of alcohol abuse. She has been able to maintain sobriety, sometimes for months at a time. She does not like going to AA, but engages with the community support team from [place]. She has no history of seizures or delirium tremens. Does not typically abuse other drugs regularly.   REVIEW OF SYSTEMS: Depressed mood. Fatigued. Anxious. Passive suicidal thoughts. also some ideas still about wishing that she were dead. No hallucinations. Physically feeling run down, slightly sick to her stomach. No other specific physical symptoms.   MENTAL STATUS EXAMINATION: Disheveled woman, looks her stated age, cooperative with the interview. Eye contact minimal. Psychomotor  activity minimal. Speech is quiet, decreased in amount. Affect is flat. Mood stated as depressed. Thoughts are slow but lucid. No obvious delusions or psychotic thinking. Denies auditory or visual hallucinations. Endorses suicidal ideation. No homicidal ideation. She is alert and oriented x 4. Repeats 3 words easily and remembers all 3 at 3 minutes.  Judgment and insight are adequate. Baseline intelligence and fund of knowledge normal.   LABORATORY RESULTS: Chemistry panel is entirely normal. Drug screen is positive for benzodiazepines and for cocaine. Alcohol level 134 on presentation yesterday afternoon shortly after 3:00 p.m. CBC is all normal.   ASSESSMENT: A 59 year old woman with alcohol abuse disorder, severe and recurrent major depression and panic disorder comes to the hospital with suicidal ideation and also been drinking heavily. Continues to endorse suicidal ideation right now.   TREATMENT PLAN: I am not sure whether we will have a bed available downstairs today. If one becomes available, we can admit her to the hospital. If there is not one today, we can either refer her out or work on admission possibly tomorrow. Meanwhile, detoxification orders in place. Also, continue her usual outpatient psychiatric medicine. Plan discussed with the patient and she agrees.   DIAGNOSIS, PRINCIPAL AND PRIMARY:  AXIS I: Major depression, severe, recurrent.   SECONDARY DIAGNOSES: AXIS I:  1.  Alcohol abuse disorder, severe.  2.  Panic disorder.   AXIS II: Deferred.    ____________________________ Gonzella Lex, MD jtc:at D: 10/12/2014 12:19:27 ET T: 10/12/2014 12:36:13 ET JOB#: [id number]  cc: Gonzella Lex, MD, <Dictator> Gonzella Lex MD ELECTRONICALLY SIGNED 10/27/2014 19:30

## 2014-10-30 DIAGNOSIS — Z7289 Other problems related to lifestyle: Secondary | ICD-10-CM | POA: Insufficient documentation

## 2014-10-30 DIAGNOSIS — F419 Anxiety disorder, unspecified: Secondary | ICD-10-CM | POA: Insufficient documentation

## 2014-10-30 DIAGNOSIS — F329 Major depressive disorder, single episode, unspecified: Secondary | ICD-10-CM | POA: Insufficient documentation

## 2014-10-30 DIAGNOSIS — F1090 Alcohol use, unspecified, uncomplicated: Secondary | ICD-10-CM | POA: Insufficient documentation

## 2014-10-30 DIAGNOSIS — Z789 Other specified health status: Secondary | ICD-10-CM | POA: Insufficient documentation

## 2014-10-30 DIAGNOSIS — F32A Depression, unspecified: Secondary | ICD-10-CM | POA: Insufficient documentation
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
## 2014-12-19 DIAGNOSIS — R45851 Suicidal ideations: Secondary | ICD-10-CM | POA: Insufficient documentation

## 2015-01-07 ENCOUNTER — Encounter: Payer: Self-pay | Admitting: Emergency Medicine

## 2015-01-07 ENCOUNTER — Emergency Department: Payer: Medicare Other

## 2015-01-07 ENCOUNTER — Inpatient Hospital Stay
Admission: EM | Admit: 2015-01-07 | Discharge: 2015-01-10 | DRG: 897 | Disposition: A | Discharge status: Home / Self Care | Payer: Medicare Other | Attending: Specialist | Admitting: Specialist

## 2015-01-07 DIAGNOSIS — F4310 Post-traumatic stress disorder, unspecified: Secondary | ICD-10-CM | POA: Diagnosis present

## 2015-01-07 DIAGNOSIS — Z825 Family history of asthma and other chronic lower respiratory diseases: Secondary | ICD-10-CM

## 2015-01-07 DIAGNOSIS — F10220 Alcohol dependence with intoxication, uncomplicated: Secondary | ICD-10-CM

## 2015-01-07 DIAGNOSIS — F10239 Alcohol dependence with withdrawal, unspecified: Secondary | ICD-10-CM | POA: Diagnosis not present

## 2015-01-07 DIAGNOSIS — Z888 Allergy status to other drugs, medicaments and biological substances status: Secondary | ICD-10-CM

## 2015-01-07 DIAGNOSIS — G4700 Insomnia, unspecified: Secondary | ICD-10-CM | POA: Diagnosis present

## 2015-01-07 DIAGNOSIS — G43909 Migraine, unspecified, not intractable, without status migrainosus: Secondary | ICD-10-CM | POA: Diagnosis present

## 2015-01-07 DIAGNOSIS — Z811 Family history of alcohol abuse and dependence: Secondary | ICD-10-CM

## 2015-01-07 DIAGNOSIS — F10939 Alcohol use, unspecified with withdrawal, unspecified: Secondary | ICD-10-CM | POA: Diagnosis present

## 2015-01-07 DIAGNOSIS — Z89222 Acquired absence of left upper limb above elbow: Secondary | ICD-10-CM

## 2015-01-07 DIAGNOSIS — F411 Generalized anxiety disorder: Secondary | ICD-10-CM | POA: Diagnosis present

## 2015-01-07 DIAGNOSIS — Z79899 Other long term (current) drug therapy: Secondary | ICD-10-CM

## 2015-01-07 DIAGNOSIS — R000 Tachycardia, unspecified: Secondary | ICD-10-CM | POA: Diagnosis present

## 2015-01-07 DIAGNOSIS — Y835 Amputation of limb(s) as the cause of abnormal reaction of the patient, or of later complication, without mention of misadventure at the time of the procedure: Secondary | ICD-10-CM | POA: Diagnosis present

## 2015-01-07 DIAGNOSIS — Z915 Personal history of self-harm: Secondary | ICD-10-CM

## 2015-01-07 DIAGNOSIS — R079 Chest pain, unspecified: Secondary | ICD-10-CM

## 2015-01-07 DIAGNOSIS — Z9660 Presence of unspecified orthopedic joint implant: Secondary | ICD-10-CM | POA: Diagnosis present

## 2015-01-07 DIAGNOSIS — Z9119 Patient's noncompliance with other medical treatment and regimen: Secondary | ICD-10-CM | POA: Diagnosis present

## 2015-01-07 DIAGNOSIS — Z9889 Other specified postprocedural states: Secondary | ICD-10-CM

## 2015-01-07 DIAGNOSIS — T8789 Other complications of amputation stump: Secondary | ICD-10-CM | POA: Diagnosis present

## 2015-01-07 DIAGNOSIS — S48112A Complete traumatic amputation at level between left shoulder and elbow, initial encounter: Secondary | ICD-10-CM | POA: Diagnosis present

## 2015-01-07 DIAGNOSIS — F17210 Nicotine dependence, cigarettes, uncomplicated: Secondary | ICD-10-CM | POA: Diagnosis present

## 2015-01-07 DIAGNOSIS — F332 Major depressive disorder, recurrent severe without psychotic features: Secondary | ICD-10-CM | POA: Diagnosis present

## 2015-01-07 DIAGNOSIS — G629 Polyneuropathy, unspecified: Secondary | ICD-10-CM | POA: Diagnosis present

## 2015-01-07 DIAGNOSIS — T1491 Suicide attempt: Secondary | ICD-10-CM | POA: Diagnosis present

## 2015-01-07 HISTORY — DX: Anxiety disorder, unspecified: F41.9

## 2015-01-07 HISTORY — DX: Insomnia, unspecified: G47.00

## 2015-01-07 HISTORY — DX: Polyneuropathy, unspecified: G62.9

## 2015-01-07 LAB — COMPREHENSIVE METABOLIC PANEL
ALT: 22 U/L (ref 14–54)
AST: 33 U/L (ref 15–41)
Albumin: 4.5 g/dL (ref 3.5–5.0)
Alkaline Phosphatase: 83 U/L (ref 38–126)
Anion gap: 15 (ref 5–15)
BUN: 14 mg/dL (ref 6–20)
CALCIUM: 8.4 mg/dL — AB (ref 8.9–10.3)
CHLORIDE: 97 mmol/L — AB (ref 101–111)
CO2: 21 mmol/L — AB (ref 22–32)
CREATININE: 0.82 mg/dL (ref 0.44–1.00)
GFR calc Af Amer: >60 mL/min (ref >60)
GFR calc non Af Amer: >60 mL/min (ref >60)
GLUCOSE: 74 mg/dL (ref 65–99)
Potassium: 3.5 mmol/L (ref 3.5–5.1)
Sodium: 133 mmol/L — ABNORMAL LOW (ref 135–145)
Total Bilirubin: 0.4 mg/dL (ref 0.3–1.2)
Total Protein: 8.1 g/dL (ref 6.5–8.1)

## 2015-01-07 LAB — URINE DRUG SCREEN, QUALITATIVE (ARMC ONLY)
AMPHETAMINES, UR SCREEN: NOT DETECTED
BARBITURATES, UR SCREEN: NOT DETECTED
BENZODIAZEPINE, UR SCRN: NOT DETECTED
COCAINE METABOLITE, UR ~~LOC~~: NOT DETECTED
Cannabinoid 50 Ng, Ur ~~LOC~~: NOT DETECTED
MDMA (Ecstasy)Ur Screen: NOT DETECTED
Methadone Scn, Ur: NOT DETECTED
OPIATE, UR SCREEN: NOT DETECTED
PHENCYCLIDINE (PCP) UR S: NOT DETECTED
TRICYCLIC, UR SCREEN: NOT DETECTED

## 2015-01-07 LAB — CBC
HEMATOCRIT: 47.4 % — AB (ref 35.0–47.0)
Hemoglobin: 16.1 g/dL — ABNORMAL HIGH (ref 12.0–16.0)
MCH: 30.2 pg (ref 26.0–34.0)
MCHC: 33.9 g/dL (ref 32.0–36.0)
MCV: 89.1 fL (ref 80.0–100.0)
PLATELETS: 419 10*3/uL (ref 150–440)
RBC: 5.32 MIL/uL — ABNORMAL HIGH (ref 3.80–5.20)
RDW: 13.4 % (ref 11.5–14.5)
WBC: 10.7 10*3/uL (ref 3.6–11.0)

## 2015-01-07 LAB — ETHANOL
Alcohol, Ethyl (B): 201 mg/dL — ABNORMAL HIGH (ref <5)
Alcohol, Ethyl (B): 61 mg/dL — ABNORMAL HIGH (ref <5)

## 2015-01-07 LAB — SALICYLATE LEVEL: SALICYLATE LVL: 8.1 mg/dL (ref 2.8–30.0)

## 2015-01-07 LAB — ACETAMINOPHEN LEVEL: Acetaminophen (Tylenol), Serum: <10 ug/mL — ABNORMAL LOW (ref 10–30)

## 2015-01-07 MED ORDER — SODIUM CHLORIDE 0.9 % IV BOLUS (SEPSIS)
1000.0000 mL | Freq: Once | INTRAVENOUS | Status: AC
  Administered 2015-01-07: 1000 mL via INTRAVENOUS

## 2015-01-07 MED ORDER — DEXTROSE 5 % AND 0.9 % NACL IV BOLUS
1000.0000 mL | Freq: Once | INTRAVENOUS | Status: DC
  Filled 2015-01-07: qty 1000

## 2015-01-07 MED ORDER — LORAZEPAM 2 MG PO TABS: 0.0000 mg | ORAL_TABLET | Freq: Four times a day (QID) | ORAL | Status: DC

## 2015-01-07 MED ORDER — PROMETHAZINE HCL 25 MG PO TABS
25.0000 mg | ORAL_TABLET | Freq: Once | ORAL | Status: AC
  Administered 2015-01-07: 25 mg via ORAL
  Filled 2015-01-07: qty 1

## 2015-01-07 MED ORDER — LORAZEPAM 1 MG PO TABS
ORAL_TABLET | ORAL | Status: AC
  Administered 2015-01-07: 1 mg via ORAL
  Filled 2015-01-07: qty 1

## 2015-01-07 MED ORDER — LORAZEPAM 2 MG PO TABS: 0.0000 mg | ORAL_TABLET | Freq: Two times a day (BID) | ORAL | Status: DC

## 2015-01-07 MED ORDER — VITAMIN B-1 100 MG PO TABS: 100.0000 mg | ORAL_TABLET | Freq: Every day | ORAL | Status: DC

## 2015-01-07 MED ORDER — LORAZEPAM 2 MG/ML IJ SOLN: 0.0000 mg | Freq: Two times a day (BID) | INTRAMUSCULAR | Status: DC

## 2015-01-07 MED ORDER — LORAZEPAM 1 MG PO TABS
1.0000 mg | ORAL_TABLET | Freq: Once | ORAL | Status: AC
  Administered 2015-01-07: 1 mg via ORAL

## 2015-01-07 MED ORDER — LORAZEPAM 2 MG/ML IJ SOLN: 0.0000 mg | Freq: Four times a day (QID) | INTRAMUSCULAR | Status: DC

## 2015-01-07 MED ORDER — THIAMINE HCL 100 MG/ML IJ SOLN: 100.0000 mg | Freq: Every day | INTRAMUSCULAR | Status: DC

## 2015-01-07 MED ORDER — LORAZEPAM 2 MG PO TABS
2.0000 mg | ORAL_TABLET | Freq: Once | ORAL | Status: AC
  Administered 2015-01-07: 2 mg via ORAL
  Filled 2015-01-07: qty 1

## 2015-01-07 NOTE — ED Notes (Signed)
BEHAVIORAL HEALTH ROUNDING Patient sleeping: No. Patient alert and oriented: yes Behavior appropriate: Yes.  ; If no, describe:  Nutrition and fluids offered: Yes  Toileting and hygiene offered: Yes  Sitter present: yes Law enforcement present: Yes  

## 2015-01-07 NOTE — ED Notes (Signed)
BEHAVIORAL HEALTH ROUNDING Patient sleeping: Yes.   Patient alert and oriented: yes Behavior appropriate: Yes.  ; If no, describe:  Nutrition and fluids offered: No Toileting and hygiene offered: No Sitter present: yes Law enforcement present: Yes  

## 2015-01-07 NOTE — ED Notes (Signed)
Pt sleeping after she had received ativan - pt easily arousable.

## 2015-01-07 NOTE — ED Notes (Signed)
Pt starting to be shaky again. Spoke with dr for ativan order.

## 2015-01-07 NOTE — BHH Counselor (Signed)
Received phone call from [facility] ([phone]), stating the patient is able to come to their facility but not until she has been cleared for 24 hours from IVC.  [facility] can not admit anyone who was under IVC until they have been cleared and observed for 24 hours.  Updated patient's nurse Lelon Frohlich).

## 2015-01-07 NOTE — BHH Counselor (Signed)
Updated BAC faxed to [facility]([phone]) and confirmed it was received. Will call back with disposition.
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
## 2015-01-07 NOTE — BHH Counselor (Signed)
Patient has been accepted to [facility] ([phone]). Will call back with a pick up time. Updated patient's nurse Noralyn Pick.)

## 2015-01-07 NOTE — ED Notes (Signed)

## 2015-01-07 NOTE — Consult Note (Signed)
[place] [facility] Consult   Reason for Consult:  Follow up Referring Physician:  ER Patient Identification: [name] MRN:  [id number] Principal Diagnosis: <principal problem not specified> Diagnosis:   Patient Active Problem List   Diagnosis Date Noted  . GAD (generalized anxiety disorder) [F41.1] 12/24/2013  . Severe recurrent major depression without psychotic features [F33.2] 12/22/2013  . PTSD (post-traumatic stress disorder) [F43.10] 12/22/2013  . Alcohol dependence [F10.20] 12/21/2013    Total Time spent with patient: 45 minutes  Subjective:   [name] is a 59 y.o. female patient admitted with a long H/O binge drinking and broke up with boy friend of over a ry and started drinking beer at the rate of a case of beer a day and became depressed and wanted to hurt herself and tried to fall off steps and called ambulance for help.Marland Kitchen  HPI:  Was Inpt at [facility] in May of 2016 for alcohol dependence and depression. Being followed on Out pt at [facility] by Dr. Leonides Schanz. H/O suicide attempts on 3 occasions by o.d on pills.  HPI Elements:     Past Medical History:  Past Medical History  Diagnosis Date  . Depression   . Anxiety   . Neuropathy   . Insomnia     Past Surgical History  Procedure Laterality Date  . Cesarean section    . Joint replacement Right   . 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 Abdominal hysterectomy    . Right hip surgery     Family History: No family history on file. Social History:  History  Alcohol Use  . 14.4 oz/week  . 24 Cans of beer per week    Comment: 1 case per day     History  Drug Use No    Comment: Patient denies     History   Social History  . Marital Status: Divorced    Spouse Name: N/A  . Number of Children: N/A  . Years of Education: N/A   Social History Main Topics  . Smoking status: Current Every Day Smoker -- 0.50 packs/day    Types: Cigarettes  . Smokeless tobacco: Never Used  . Alcohol Use: 14.4 oz/week     24 Cans of beer per week     Comment: 1 case per day  . Drug Use: No     Comment: Patient denies   . Sexual Activity: Not on file   Other Topics Concern  . None   Social History Narrative   Additional Social History:    History of alcohol / drug use?: Yes Name of Substance 1: ETOH 1 - Age of First Use: 48 1 - Amount (size/oz): varies 1 - Frequency: daily 1 - Duration: years 1 - Last Use / Amount: 01/07/15- 1 case of beer                   Allergies:   Allergies  Allergen Reactions  . Benadryl [Diphenhydramine] Hives  . Cephalosporins Hives    Labs:  Results for orders placed or performed during the hospital encounter of 01/07/15 (from the past 48 hour(s))  Comprehensive metabolic panel     Status: Abnormal   Collection Time: 01/07/15  3:32 PM  Result Value Ref Range   Sodium 133 (L) 135 - 145 mmol/L   Potassium 3.5 3.5 - 5.1 mmol/L   Chloride 97 (L) 101 - 111 mmol/L   CO2 21 (L) 22 - 32 mmol/L   Glucose, Bld 74  65 - 99 mg/dL   BUN 14 6 - 20 mg/dL   Creatinine, Ser 0.82 0.44 - 1.00 mg/dL   Calcium 8.4 (L) 8.9 - 10.3 mg/dL   Total Protein 8.1 6.5 - 8.1 g/dL   Albumin 4.5 3.5 - 5.0 g/dL   AST 33 15 - 41 U/L   ALT 22 14 - 54 U/L   Alkaline Phosphatase 83 38 - 126 U/L   Total Bilirubin 0.4 0.3 - 1.2 mg/dL   GFR calc non Af Amer >60 >60 mL/min   GFR calc Af Amer >60 >60 mL/min    Comment: (NOTE) The eGFR has been calculated using the CKD EPI equation. This calculation has not been validated in all clinical situations. eGFR's persistently <60 mL/min signify possible Chronic Kidney Disease.    Anion gap 15 5 - 15  Ethanol (ETOH)     Status: Abnormal   Collection Time: 01/07/15  3:32 PM  Result Value Ref Range   Alcohol, Ethyl (B) 201 (H) <5 mg/dL    Comment:        LOWEST DETECTABLE LIMIT FOR SERUM ALCOHOL IS 5 mg/dL FOR MEDICAL PURPOSES ONLY   Salicylate level     Status: None   Collection Time: 01/07/15  3:32 PM  Result Value Ref Range   Salicylate  Lvl 8.1 2.8 - 30.0 mg/dL  Acetaminophen level     Status: Abnormal   Collection Time: 01/07/15  3:32 PM  Result Value Ref Range   Acetaminophen (Tylenol), Serum <10 (L) 10 - 30 ug/mL    Comment:        THERAPEUTIC CONCENTRATIONS VARY SIGNIFICANTLY. A RANGE OF 10-30 ug/mL MAY BE AN EFFECTIVE CONCENTRATION FOR MANY PATIENTS. HOWEVER, SOME ARE BEST TREATED AT CONCENTRATIONS OUTSIDE THIS RANGE. ACETAMINOPHEN CONCENTRATIONS >150 ug/mL AT 4 HOURS AFTER INGESTION AND >50 ug/mL AT 12 HOURS AFTER INGESTION ARE OFTEN ASSOCIATED WITH TOXIC REACTIONS.   CBC     Status: Abnormal   Collection Time: 01/07/15  3:32 PM  Result Value Ref Range   WBC 10.7 3.6 - 11.0 K/uL   RBC 5.32 (H) 3.80 - 5.20 MIL/uL   Hemoglobin 16.1 (H) 12.0 - 16.0 g/dL   HCT 47.4 (H) 35.0 - 47.0 %   MCV 89.1 80.0 - 100.0 fL   MCH 30.2 26.0 - 34.0 pg   MCHC 33.9 32.0 - 36.0 g/dL   RDW 13.4 11.5 - 14.5 %   Platelets 419 150 - 440 K/uL  Urine Drug Screen, Qualitative ([facility] only)     Status: None   Collection Time: 01/07/15  3:35 PM  Result Value Ref Range   Tricyclic, Ur Screen NONE DETECTED NONE DETECTED   Amphetamines, Ur Screen NONE DETECTED NONE DETECTED   MDMA (Ecstasy)Ur Screen NONE DETECTED NONE DETECTED   Cocaine Metabolite,Ur [place] NONE DETECTED NONE DETECTED   Opiate, Ur Screen NONE DETECTED NONE DETECTED   Phencyclidine (PCP) Ur S NONE DETECTED NONE DETECTED   Cannabinoid 50 Ng, Ur [place] NONE DETECTED NONE DETECTED   Barbiturates, Ur Screen NONE DETECTED NONE DETECTED   Benzodiazepine, Ur Scrn NONE DETECTED NONE DETECTED   Methadone Scn, Ur NONE DETECTED NONE DETECTED    Comment: (NOTE) 741  Tricyclics, urine               Cutoff 1000 ng/mL 200  Amphetamines, urine             Cutoff 1000 ng/mL 300  MDMA (Ecstasy), urine  Cutoff 500 ng/mL 400  Cocaine Metabolite, urine       Cutoff 300 ng/mL 500  Opiate, urine                   Cutoff 300 ng/mL 600  Phencyclidine (PCP), urine      Cutoff 25  ng/mL 700  Cannabinoid, urine              Cutoff 50 ng/mL 800  Barbiturates, urine             Cutoff 200 ng/mL 900  Benzodiazepine, urine           Cutoff 200 ng/mL 1000 Methadone, urine                Cutoff 300 ng/mL 1100 1200 The urine drug screen provides only a preliminary, unconfirmed 1300 analytical test result and should not be used for non-medical 1400 purposes. Clinical consideration and professional judgment should 1500 be applied to any positive drug screen result due to possible 1600 interfering substances. A more specific alternate chemical method 1700 must be used in order to obtain a confirmed analytical result.  1800 Gas chromato graphy / mass spectrometry (GC/MS) is the preferred 1900 confirmatory method.     Vitals: Blood pressure 138/76, pulse 118, temperature 98.3 F (36.8 C), temperature source Oral, resp. rate 20, height $RemoveBe'5\' 4"'TWdDeukcH$  (1.626 m), weight 62.143 kg (137 lb), SpO2 96 %.  Risk to Self: Suicidal Ideation: Yes-Currently Present Suicidal Intent: Yes-Currently Present Is patient at risk for suicide?: Yes Suicidal Plan?: Yes-Currently Present Specify Current Suicidal Plan: Attempt to harm self by throwing herself down stairs Access to Means: Yes Specify Access to Suicidal Means: Access to stairs at home What has been your use of drugs/alcohol within the last 12 months?: ETOH How many times?: 3 Triggers for Past Attempts: Unpredictable Intentional Self Injurious Behavior: None Risk to Others: Homicidal Ideation: No Thoughts of Harm to Others: No Current Homicidal Intent: No Current Homicidal Plan: No Access to Homicidal Means: No Identified Victim: None History of harm to others?: No Assessment of Violence: None Noted Violent Behavior Description: Pt is calm and cooperative Does patient have access to weapons?: No Criminal Charges Pending?: No Does patient have a court date: No Prior Inpatient Therapy: Prior Inpatient Therapy: Yes Prior Therapy  Dates: 2016 Prior Therapy Facilty/Provider(s): [facility] Reason for Treatment: Substance Abuse Prior Outpatient Therapy: Prior Outpatient Therapy: Yes Prior Therapy Dates: current Prior Therapy Facilty/Provider(s): [facility] Reason for Treatment: Med Management & Therapy Does patient have an ACCT team?: No Does patient have Intensive In-House Services?  : No Does patient have [facility] services? : No Does patient have P4CC services?: No  Current Facility-Administered Medications  Medication Dose Route Frequency Provider Last Rate Last Dose  . LORazepam (ATIVAN) injection 0-4 mg  0-4 mg Intravenous 4 times per day Carrie Mew, MD      . LORazepam (ATIVAN) injection 0-4 mg  0-4 mg Intravenous Q12H Carrie Mew, MD      . LORazepam (ATIVAN) tablet 0-4 mg  0-4 mg Oral 4 times per day Carrie Mew, MD      . LORazepam (ATIVAN) tablet 0-4 mg  0-4 mg Oral Q12H Carrie Mew, MD      . thiamine (B-1) injection 100 mg  100 mg Intravenous Daily Carrie Mew, MD      . thiamine (VITAMIN B-1) tablet 100 mg  100 mg Oral Daily Carrie Mew, MD       Current Outpatient Prescriptions  Medication Sig Dispense Refill  . docusate sodium 100 MG CAPS Take 100 mg by mouth 2 (two) times daily. (May purchase from over the counter at yr. Local pharmacy): For constipation 10 capsule 0  . gabapentin (NEURONTIN) 400 MG capsule Take 2 capsules (800 mg total) by mouth 3 (three) times daily. For substance withdrawal syndrome 180 capsule 0  . propranolol (INDERAL) 10 MG tablet Take 1 tablet (10 mg total) by mouth 2 (two) times daily. For anxiety 60 tablet 0  . QUEtiapine (SEROQUEL) 25 MG tablet Take 1 tablet (25 mg total) by mouth every 6 (six) hours as needed (anxiety, agitation). 120 tablet 0  . traZODone (DESYREL) 100 MG tablet Take 2 tablets (200 mg total) by mouth at bedtime. For sleep 60 tablet 0  . venlafaxine XR (EFFEXOR-XR) 150 MG 24 hr capsule Take 1 capsule (150 mg total) by mouth daily with  breakfast. For depression 30 capsule 0    Musculoskeletal: Strength & Muscle Tone: within normal limits Gait & Station: normal Patient leans: N/A  Psychiatric Specialty Exam: Physical Exam  Review of Systems  Constitutional: Negative.   HENT: Negative.   Eyes: Negative.   Respiratory: Negative.   Cardiovascular: Negative.   Gastrointestinal: Negative.   Genitourinary: Negative.   Musculoskeletal: Negative.   Skin: Negative.   Endo/Heme/Allergies: Negative.   Psychiatric/Behavioral: Positive for substance abuse. The patient is nervous/anxious.     Blood pressure 138/76, pulse 118, temperature 98.3 F (36.8 C), temperature source Oral, resp. rate 20, height $RemoveBe'5\' 4"'FSCyEBQJF$  (1.626 m), weight 62.143 kg (137 lb), SpO2 96 %.Body mass index is 23.5 kg/(m^2).  General Appearance: Casual  Eye Contact::  Fair  Speech:  Clear and Coherent  Volume:  Normal  Mood:  Anxious, Depressed, Dysphoric and adequate.  Affect:  Appropriate  Thought Process:  Circumstantial  Orientation:  Full (Time, Place, and Person)  Thought Content:  WDL  Suicidal Thoughts:  No  Homicidal Thoughts:  No  Memory:  Immediate;   Fair Recent;   Fair Remote;   Fair adequate  Judgement:  Intact  Insight:  Fair  Psychomotor Activity:  Normal  Concentration:  Fair  Recall:  [employer] of Knowledge:Fair  Language: Fair  Akathisia:  No  Handed:  Right  AIMS (if indicated):     Assets:  Communication Skills Desire for Improvement Financial Resources/Insurance Housing Leisure Time  ADL's:  Intact  Cognition: WNL  Sleep:      Medical Decision Making: New problem, with additional work up planned  Treatment Plan Summary: Plan D/C IVC and Discharge pt to [facility] for Detox from Alcohol as pt is eager to get help for the same  Plan:  No evidence of imminent risk to self or others at present.   Disposition: as above  Dewain Penning 01/07/2015 6:17 PM

## 2015-01-07 NOTE — ED Notes (Signed)
ENVIRONMENTAL ASSESSMENT  Potentially harmful objects out of patient reach: Yes.  Personal belongings secured: Yes.  Patient dressed in hospital provided attire only: Yes.  Plastic bags out of patient reach: Yes.  Patient care equipment (cords, cables, call bells, lines, and drains) shortened, removed, or accounted for: Yes.  Equipment and supplies removed from bottom of stretcher: Yes.  Potentially toxic materials out of patient reach: Yes.  Sharps container removed or out of patient reach: Yes.   BEHAVIORAL HEALTH ROUNDING Patient sleeping: Yes.   Patient alert and oriented: not applicable SLEEPING Behavior appropriate: Yes.  ; If no, describe: SLEEPING Nutrition and fluids offered: No SLEEPING Toileting and hygiene offered: NoSLEEPING Sitter present: not applicable Law enforcement present: Yes ODS 

## 2015-01-07 NOTE — BHH Counselor (Addendum)
Referral information faxed to [facility]([phone] and confirmed it was received. Requesting a updated BAC. It will need to be under 100. Informaton forwarded to Patient's nurse Tresa Res).

## 2015-01-07 NOTE — ED Notes (Signed)
Spoke with Kerry Dory, [facility] who reports [facility] will not take pt until 24 hours after her IVC papers were rescended. Explained to pt what is going on and pt is understanding. Dr Joni Fears informed and orders received to continue CIWA monitoring and move pt to [facility].

## 2015-01-07 NOTE — Progress Notes (Signed)
Counselor provided patient information about [facility] as she request alcohol detox. TTS will complete referral for [facility].

## 2015-01-07 NOTE — ED Notes (Signed)
Pt BIB EMS for detox from Alcohol. Last used this morning. Pt normally drinks 24 beers a day. Denies use of street drugs. Pt reports trying to harm herself by throwing herself down the stairs this morning. Pt states she has had previous suicidal attempts in the past. Pt states abd pain and pain to forehead and nose from where she fell down the stairs.

## 2015-01-07 NOTE — ED Notes (Signed)
APPEARANCE/BEHAVIOR  calm, cooperative and adequate rapport can be established  NEURO ASSESSMENT  Orientation: time, place and person  Hallucinations: No.None noted (Hallucinations)  Speech: Normal  Gait: normal  RESPIRATORY ASSESSMENT  WNL  CARDIOVASCULAR ASSESSMENT  WNL  GASTROINTESTINAL ASSESSMENT  WNL  EXTREMITIES  ROM of all joints is normal  PLAN OF CARE  Provide calm/safe environment. Vital signs assessed twice daily. ED BHU Assessment once each 12-hour shift. Collaborate with intake RN daily or as condition indicates. Assure the ED provider has rounded once each shift. Provide and encourage hygiene. Provide redirection as needed. Assess for escalating behavior; address immediately and inform ED provider.  Assess family dynamic and appropriateness for visitation as needed: Yes. ; If necessary, describe findings:  Educate the patient/family about BHU procedures/visitation: Yes. ; If necessary, describe findings:

## 2015-01-07 NOTE — ED Notes (Signed)
Called [facility] and let them know the repeat etoh is 61. They want it faxed over and then they will make arrangements. At the moment they have one patient in front of her and she is next in line. Calvin made aware and will send the info they need.

## 2015-01-07 NOTE — ED Provider Notes (Signed)
[facility] Emergency Department Provider Note  ____________________________________________  Time seen: 3:35 PM  I have reviewed the triage vital signs and the nursing notes.   HISTORY  Chief Complaint Suicide Attempt    HPI [name] is a 59 y.o. female who complains of neck pain after falling down a flight of stairs today. She reports that she has a history of alcoholism and is in remission, but 2 days ago her boyfriend broke up with her and she's been feeling very depressed, so she has been drinking a case of beer over the last 2 days. She reports that her last drink was earlier this morning. She notes that prior to this morning, she was developing nausea and lightheadedness and malaise. She continued drinking after this. She does not have any recent seizure tremor flushing or anxiety. She does indicate that she fell on the steps intentionally attempted self-harm. No current plan to harm herself. No homicidal ideation or hallucinations.   She did not lose consciousness during her fall down steps. No numbness tingling or weakness anywhere. No vision changes, no headache  Past Medical History  Diagnosis Date  . Depression   . Anxiety   . Neuropathy   . Insomnia     Patient Active Problem List   Diagnosis Date Noted  . GAD (generalized anxiety disorder) 12/24/2013  . Severe recurrent major depression without psychotic features 12/22/2013  . PTSD (post-traumatic stress disorder) 12/22/2013  . Alcohol dependence 12/21/2013    Past Surgical History  Procedure Laterality Date  . Cesarean section    . Joint replacement Right   . 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 Abdominal hysterectomy    . Right hip surgery      Current Outpatient Rx  Name  Route  Sig  Dispense  Refill  . docusate sodium 100 MG CAPS   Oral   Take 100 mg by mouth 2 (two) times daily. (May purchase from over the counter at yr. Local pharmacy): For constipation   10 capsule  0   . gabapentin (NEURONTIN) 400 MG capsule   Oral   Take 2 capsules (800 mg total) by mouth 3 (three) times daily. For substance withdrawal syndrome   180 capsule   0   . propranolol (INDERAL) 10 MG tablet   Oral   Take 1 tablet (10 mg total) by mouth 2 (two) times daily. For anxiety   60 tablet   0   . QUEtiapine (SEROQUEL) 25 MG tablet   Oral   Take 1 tablet (25 mg total) by mouth every 6 (six) hours as needed (anxiety, agitation).   120 tablet   0   . traZODone (DESYREL) 100 MG tablet   Oral   Take 2 tablets (200 mg total) by mouth at bedtime. For sleep   60 tablet   0   . venlafaxine XR (EFFEXOR-XR) 150 MG 24 hr capsule   Oral   Take 1 capsule (150 mg total) by mouth daily with breakfast. For depression   30 capsule   0     Allergies Benadryl and Cephalosporins  No family history on file.  Social History History  Substance Use Topics  . Smoking status: Current Every Day Smoker -- 0.50 packs/day    Types: Cigarettes  . Smokeless tobacco: Never Used  . Alcohol Use: 14.4 oz/week    24 Cans of beer per week     Comment: 1 case per day    Review of Systems  Constitutional: No fever or chills. No weight changes Eyes:No blurry vision or double vision.  ENT: No sore throat. Cardiovascular: No chest pain. Respiratory: No dyspnea or cough. Gastrointestinal: Negative for abdominal pain, vomiting and diarrhea.  No BRBPR or melena. Genitourinary: Negative for dysuria, urinary retention, bloody urine, or difficulty urinating. Musculoskeletal: Positive neck pain. Skin: Negative for rash. Neurological: Negative for headaches, focal weakness or numbness. Psychiatric:Positive depression.   Endocrine:No hot/cold intolerance, changes in energy, or sleep difficulty.  10-point ROS otherwise negative.  ____________________________________________   PHYSICAL EXAM:  VITAL SIGNS: ED Triage Vitals  Enc Vitals Group     BP 01/07/15 1514 138/76 mmHg     Pulse  Rate 01/07/15 1514 122     Resp 01/07/15 1514 20     Temp 01/07/15 1514 98.3 F (36.8 C)     Temp Source 01/07/15 1514 Oral     SpO2 01/07/15 1514 96 %     Weight 01/07/15 1514 137 lb (62.143 kg)     Height 01/07/15 1514 5\' 4"  (1.626 m)     Head Cir --      Peak Flow --      Pain Score --      Pain Loc --      Pain Edu? --      Excl. in [place]? --      Constitutional: Alert and oriented. Well appearing and in no distress. Eyes: No scleral icterus. No conjunctival pallor. PERRL. EOMI ENT   Head: Normocephalic and atraumatic.   Nose: No congestion/rhinnorhea. No septal hematoma   Mouth/Throat: MMM, no pharyngeal erythema. No peritonsillar mass. No uvula shift.   Neck: No stridor. No SubQ emphysema. No meningismus. Midline neck pain in the lower C-spine. The patient has been ambulatory since her fall down the steps, without loss of consciousness during the fall. Hematological/Lymphatic/Immunilogical: No cervical lymphadenopathy. Cardiovascular: Tachycardia heart rate 110. Normal and symmetric distal pulses are present in all extremities. No murmurs, rubs, or gallops. Respiratory: Normal respiratory effort without tachypnea nor retractions. Breath sounds are clear and equal bilaterally. No wheezes/rales/rhonchi. Gastrointestinal: Soft and nontender. No distention. There is no CVA tenderness.  No rebound, rigidity, or guarding. Genitourinary: deferred Musculoskeletal: Nontender with normal range of motion in all extremities. No joint effusions.  No lower extremity tenderness.  No edema. Neurologic:   Normal speech and language.  CN 2-10 normal. Motor grossly intact. No pronator drift.  Normal gait. No gross focal neurologic deficits are appreciated.  Skin:  Skin is warm, dry and intact. No rash noted.  No petechiae, purpura, or bullae. Psychiatric: Depressed mood and affect. ____________________________________________    LABS (pertinent positives/negatives) (all labs  ordered are listed, but only abnormal results are displayed) Labs Reviewed  COMPREHENSIVE METABOLIC PANEL - Abnormal; Notable for the following:    Sodium 133 (*)    Chloride 97 (*)    CO2 21 (*)    Calcium 8.4 (*)    All other components within normal limits  ETHANOL - Abnormal; Notable for the following:    Alcohol, Ethyl (B) 201 (*)    All other components within normal limits  ACETAMINOPHEN LEVEL - Abnormal; Notable for the following:    Acetaminophen (Tylenol), Serum <10 (*)    All other components within normal limits  CBC - Abnormal; Notable for the following:    RBC 5.32 (*)    Hemoglobin 16.1 (*)    HCT 47.4 (*)    All other components within normal limits  SALICYLATE LEVEL  URINE DRUG SCREEN, QUALITATIVE ([facility] ONLY)   ____________________________________________   EKG    ____________________________________________    RADIOLOGY  X-rays cervical spine unremarkable  ____________________________________________   PROCEDURES  ____________________________________________   INITIAL IMPRESSION / ASSESSMENT AND PLAN / ED COURSE  Pertinent labs & imaging results that were available during my care of the patient were reviewed by me and considered in my medical decision making (see chart for details).  Patient presents with tachycardia likely related to dehydration from alcohol binging and possible alcoholic ketoacidosis. We'll check labs and give IV fluids. Also with her intentional fall down the steps and symptoms of depression, I will IVC her pending psychiatry evaluation.  ----------------------------------------- 7:27 PM on 01/07/2015 -----------------------------------------  IV was nonfunctional after the patient went to the bathroom. By this time the labs had returned which showed that the patient does not have any evidence of acidosis. We therefore discontinued IV fluids and will not replace the IV, and we'll allow the patient to orally hydrate herself.  I discussed the patient's care with psychiatry Dr. Dillard Cannon in the ED and feels that the patient is psychiatrically stable. Does not feel there is any significant safety risk regarding harm to self or others. No evidence of severe withdrawal at this time. The patient was given 2 mg of Ativan by mouth for her anxiousness. She is tolerating oral intake. Plan is to refer her to [facility] for further detox treatment. Her alcohol level is 200, so very low suspicion for alcohol withdrawal at this time.  ____________________________________________   FINAL CLINICAL IMPRESSION(S) / ED DIAGNOSES  Final diagnoses:  Alcohol dependence with uncomplicated intoxication      Carrie Mew, MD 01/07/15 1928

## 2015-01-07 NOTE — ED Notes (Signed)
Pt to be going to [facility] once accepted

## 2015-01-07 NOTE — ED Notes (Signed)
Pt went to RR with IV, when she came out IV was no longer flowing. Unable to flush IV at this time. MD made aware. Per MD, okay to remove IV.

## 2015-01-08 ENCOUNTER — Inpatient Hospital Stay: Payer: Medicare Other

## 2015-01-08 ENCOUNTER — Encounter: Payer: Self-pay | Admitting: Specialist

## 2015-01-08 DIAGNOSIS — F10239 Alcohol dependence with withdrawal, unspecified: Secondary | ICD-10-CM | POA: Diagnosis present

## 2015-01-08 DIAGNOSIS — Z9889 Other specified postprocedural states: Secondary | ICD-10-CM | POA: Diagnosis not present

## 2015-01-08 DIAGNOSIS — G629 Polyneuropathy, unspecified: Secondary | ICD-10-CM | POA: Diagnosis present

## 2015-01-08 DIAGNOSIS — G43909 Migraine, unspecified, not intractable, without status migrainosus: Secondary | ICD-10-CM | POA: Diagnosis present

## 2015-01-08 DIAGNOSIS — Z79899 Other long term (current) drug therapy: Secondary | ICD-10-CM | POA: Diagnosis not present

## 2015-01-08 DIAGNOSIS — F4310 Post-traumatic stress disorder, unspecified: Secondary | ICD-10-CM | POA: Diagnosis present

## 2015-01-08 DIAGNOSIS — Z9660 Presence of unspecified orthopedic joint implant: Secondary | ICD-10-CM | POA: Diagnosis present

## 2015-01-08 DIAGNOSIS — G4700 Insomnia, unspecified: Secondary | ICD-10-CM | POA: Diagnosis present

## 2015-01-08 DIAGNOSIS — T8789 Other complications of amputation stump: Secondary | ICD-10-CM | POA: Diagnosis present

## 2015-01-08 DIAGNOSIS — F10939 Alcohol use, unspecified with withdrawal, unspecified: Secondary | ICD-10-CM | POA: Diagnosis present

## 2015-01-08 DIAGNOSIS — Z9119 Patient's noncompliance with other medical treatment and regimen: Secondary | ICD-10-CM | POA: Diagnosis present

## 2015-01-08 DIAGNOSIS — Z825 Family history of asthma and other chronic lower respiratory diseases: Secondary | ICD-10-CM | POA: Diagnosis not present

## 2015-01-08 DIAGNOSIS — F10231 Alcohol dependence with withdrawal delirium: Secondary | ICD-10-CM

## 2015-01-08 DIAGNOSIS — F411 Generalized anxiety disorder: Secondary | ICD-10-CM | POA: Diagnosis present

## 2015-01-08 DIAGNOSIS — Z89222 Acquired absence of left upper limb above elbow: Secondary | ICD-10-CM | POA: Diagnosis not present

## 2015-01-08 DIAGNOSIS — S48112A Complete traumatic amputation at level between left shoulder and elbow, initial encounter: Secondary | ICD-10-CM | POA: Diagnosis present

## 2015-01-08 DIAGNOSIS — F332 Major depressive disorder, recurrent severe without psychotic features: Secondary | ICD-10-CM | POA: Diagnosis present

## 2015-01-08 DIAGNOSIS — F10220 Alcohol dependence with intoxication, uncomplicated: Secondary | ICD-10-CM | POA: Diagnosis present

## 2015-01-08 DIAGNOSIS — Z811 Family history of alcohol abuse and dependence: Secondary | ICD-10-CM | POA: Diagnosis not present

## 2015-01-08 DIAGNOSIS — Z888 Allergy status to other drugs, medicaments and biological substances status: Secondary | ICD-10-CM | POA: Diagnosis not present

## 2015-01-08 DIAGNOSIS — Z915 Personal history of self-harm: Secondary | ICD-10-CM | POA: Diagnosis not present

## 2015-01-08 DIAGNOSIS — R000 Tachycardia, unspecified: Secondary | ICD-10-CM | POA: Diagnosis present

## 2015-01-08 DIAGNOSIS — Y835 Amputation of limb(s) as the cause of abnormal reaction of the patient, or of later complication, without mention of misadventure at the time of the procedure: Secondary | ICD-10-CM | POA: Diagnosis present

## 2015-01-08 DIAGNOSIS — F17210 Nicotine dependence, cigarettes, uncomplicated: Secondary | ICD-10-CM | POA: Diagnosis present

## 2015-01-08 DIAGNOSIS — T1491 Suicide attempt: Secondary | ICD-10-CM | POA: Diagnosis present

## 2015-01-08 MED ORDER — LORAZEPAM 2 MG PO TABS
ORAL_TABLET | ORAL | Status: AC
  Filled 2015-01-08: qty 1

## 2015-01-08 MED ORDER — LORAZEPAM 2 MG/ML IJ SOLN
INTRAMUSCULAR | Status: AC
  Filled 2015-01-08: qty 1

## 2015-01-08 MED ORDER — VITAMIN B-1 100 MG PO TABS: 100.0000 mg | ORAL_TABLET | Freq: Every day | ORAL | Status: DC

## 2015-01-08 MED ORDER — GABAPENTIN 300 MG PO CAPS
900.0000 mg | ORAL_CAPSULE | Freq: Three times a day (TID) | ORAL | Status: DC
  Administered 2015-01-08 – 2015-01-10 (×6): 900 mg via ORAL
  Filled 2015-01-08 (×6): qty 3

## 2015-01-08 MED ORDER — LORAZEPAM 1 MG PO TABS
1.0000 mg | ORAL_TABLET | Freq: Once | ORAL | Status: AC
  Administered 2015-01-08: 1 mg via ORAL

## 2015-01-08 MED ORDER — PROMETHAZINE HCL 25 MG PO TABS
ORAL_TABLET | ORAL | Status: AC
  Filled 2015-01-08: qty 1

## 2015-01-08 MED ORDER — ONDANSETRON HCL 4 MG PO TABS
4.0000 mg | ORAL_TABLET | Freq: Four times a day (QID) | ORAL | Status: DC | PRN
  Administered 2015-01-09: 4 mg via ORAL
  Filled 2015-01-08: qty 1

## 2015-01-08 MED ORDER — VITAMIN B-1 100 MG PO TABS
100.0000 mg | ORAL_TABLET | Freq: Every day | ORAL | Status: DC
  Administered 2015-01-08 – 2015-01-09 (×2): 100 mg via ORAL
  Filled 2015-01-08 (×2): qty 1

## 2015-01-08 MED ORDER — LORAZEPAM 1 MG PO TABS
ORAL_TABLET | ORAL | Status: AC
  Filled 2015-01-08: qty 1

## 2015-01-08 MED ORDER — LORAZEPAM 2 MG PO TABS
3.0000 mg | ORAL_TABLET | Freq: Once | ORAL | Status: AC
  Administered 2015-01-08: 3 mg via ORAL

## 2015-01-08 MED ORDER — VENLAFAXINE HCL ER 75 MG PO CP24
225.0000 mg | ORAL_CAPSULE | Freq: Every day | ORAL | Status: DC
Start: 2015-01-08 — End: 2015-01-10
  Administered 2015-01-09 – 2015-01-10 (×2): 225 mg via ORAL
  Filled 2015-01-08: qty 3
  Filled 2015-01-08: qty 1
  Filled 2015-01-08: qty 3

## 2015-01-08 MED ORDER — ACETAMINOPHEN 325 MG PO TABS
650.0000 mg | ORAL_TABLET | Freq: Four times a day (QID) | ORAL | Status: DC | PRN
  Administered 2015-01-09: 650 mg via ORAL
  Filled 2015-01-08: qty 2

## 2015-01-08 MED ORDER — LORAZEPAM 2 MG/ML IJ SOLN
1.0000 mg | Freq: Once | INTRAMUSCULAR | Status: AC
  Administered 2015-01-08 (×2): 1 mg via INTRAMUSCULAR

## 2015-01-08 MED ORDER — ACETAMINOPHEN 650 MG RE SUPP: 650.0000 mg | Freq: Four times a day (QID) | RECTAL | Status: DC | PRN

## 2015-01-08 MED ORDER — ONDANSETRON HCL 4 MG/2ML IJ SOLN
4.0000 mg | Freq: Four times a day (QID) | INTRAMUSCULAR | Status: DC | PRN
  Administered 2015-01-08 – 2015-01-09 (×3): 4 mg via INTRAVENOUS
  Filled 2015-01-08 (×3): qty 2

## 2015-01-08 MED ORDER — LORAZEPAM 2 MG/ML IJ SOLN
0.0000 mg | Freq: Four times a day (QID) | INTRAMUSCULAR | Status: DC
  Administered 2015-01-08: 1 mg via INTRAVENOUS
  Filled 2015-01-08: qty 1

## 2015-01-08 MED ORDER — THIAMINE HCL 100 MG/ML IJ SOLN: 100.0000 mg | Freq: Every day | INTRAMUSCULAR | Status: DC

## 2015-01-08 MED ORDER — PROMETHAZINE HCL 25 MG PO TABS
25.0000 mg | ORAL_TABLET | Freq: Once | ORAL | Status: AC
Start: 2015-01-08 — End: 2015-01-08
  Administered 2015-01-08: 25 mg via ORAL

## 2015-01-08 MED ORDER — ONDANSETRON HCL 4 MG PO TABS
4.0000 mg | ORAL_TABLET | Freq: Once | ORAL | Status: AC
  Administered 2015-01-09: 4 mg via ORAL
  Filled 2015-01-08: qty 1

## 2015-01-08 MED ORDER — LORAZEPAM 2 MG PO TABS
2.0000 mg | ORAL_TABLET | Freq: Once | ORAL | Status: AC
  Administered 2015-01-08: 2 mg via ORAL

## 2015-01-08 MED ORDER — PROPRANOLOL HCL 10 MG PO TABS
10.0000 mg | ORAL_TABLET | Freq: Three times a day (TID) | ORAL | Status: DC
  Administered 2015-01-08 – 2015-01-10 (×6): 10 mg via ORAL
  Filled 2015-01-08 (×8): qty 1

## 2015-01-08 MED ORDER — LORAZEPAM 2 MG PO TABS
ORAL_TABLET | ORAL | Status: AC
  Administered 2015-01-08: 2 mg via ORAL
  Filled 2015-01-08: qty 1

## 2015-01-08 MED ORDER — LORAZEPAM 2 MG/ML IJ SOLN: 0.0000 mg | Freq: Two times a day (BID) | INTRAMUSCULAR | Status: DC

## 2015-01-08 MED ORDER — THIAMINE HCL 100 MG/ML IJ SOLN: 100.0000 mg | Freq: Every day | INTRAVENOUS | Status: DC

## 2015-01-08 MED ORDER — TRAZODONE HCL 100 MG PO TABS
300.0000 mg | ORAL_TABLET | Freq: Every day | ORAL | Status: DC
Start: 2015-01-08 — End: 2015-01-10
  Administered 2015-01-08 – 2015-01-09 (×2): 300 mg via ORAL
  Filled 2015-01-08 (×2): qty 3
  Filled 2015-01-08: qty 2

## 2015-01-08 MED ORDER — CHLORDIAZEPOXIDE HCL 25 MG PO CAPS
50.0000 mg | ORAL_CAPSULE | ORAL | Status: AC
  Administered 2015-01-08: 50 mg via ORAL
  Filled 2015-01-08: qty 2

## 2015-01-08 MED ORDER — LORAZEPAM 2 MG/ML IJ SOLN
INTRAMUSCULAR | Status: AC
  Administered 2015-01-08: 1 mg via INTRAMUSCULAR
  Filled 2015-01-08: qty 1

## 2015-01-08 MED ORDER — SODIUM CHLORIDE 0.9 % IV SOLN
INTRAVENOUS | Status: DC
  Administered 2015-01-08: 1 mL via INTRAVENOUS
  Administered 2015-01-09 (×2): via INTRAVENOUS
  Administered 2015-01-09: 1000 mL via INTRAVENOUS
  Administered 2015-01-10: 09:00:00 via INTRAVENOUS

## 2015-01-08 MED ORDER — ADULT MULTIVITAMIN W/MINERALS CH
1.0000 | ORAL_TABLET | Freq: Every day | ORAL | Status: DC
  Administered 2015-01-08 – 2015-01-10 (×3): 1 via ORAL
  Filled 2015-01-08 (×3): qty 1

## 2015-01-08 MED ORDER — HALOPERIDOL LACTATE 5 MG/ML IJ SOLN: 2.0000 mg | Freq: Four times a day (QID) | INTRAMUSCULAR | Status: DC | PRN

## 2015-01-08 MED ORDER — FOLIC ACID 1 MG PO TABS
1.0000 mg | ORAL_TABLET | Freq: Every day | ORAL | Status: DC
  Administered 2015-01-08 – 2015-01-10 (×3): 1 mg via ORAL
  Filled 2015-01-08 (×3): qty 1

## 2015-01-08 NOTE — ED Notes (Signed)
Pt. Had a CIWA score of 13

## 2015-01-08 NOTE — ED Notes (Signed)
BEHAVIORAL HEALTH ROUNDING Patient sleeping: Yes.   Patient alert and orientedpt sleeping Behavior appropriate: ; If no, describept sleeping Nutrition and fluids offered: pt sleeping Toileting and hygiene offered: pt sleeping Sitter present: no Law enforcement present: Yes

## 2015-01-08 NOTE — ED Notes (Signed)
MD at bedside. 

## 2015-01-08 NOTE — ED Notes (Signed)
Pt. Came to door and stated she was feeling some chest tightness

## 2015-01-08 NOTE — ED Notes (Signed)

## 2015-01-08 NOTE — ED Notes (Signed)
BEHAVIORAL HEALTH ROUNDING  Patient sleeping: Yes.  Patient alert and oriented: no  Behavior appropriate: Yes. ; If no, describe:  Nutrition and fluids offered: No  Toileting and hygiene offered: No  Sitter present: no  Law enforcement present: Yes   

## 2015-01-08 NOTE — ED Notes (Addendum)
Dr. Wonda Horner notified of pt's last hr of 110 and less tremors, less sweating and sleeping.

## 2015-01-08 NOTE — Consult Note (Signed)
[name] Face-to-Face Psychiatry Consult   Reason for Consult:  Consult for this 59 year old woman with a history of alcohol abuse and recurrent depression. Being admitted to the medical service. Continued suicidal ideation. Referring Physician:  Verdell Carmine Patient Identification: [name] MRN:  [id number] Principal Diagnosis: <principal problem not specified> Diagnosis:   Patient Active Problem List   Diagnosis Date Noted  . Alcohol withdrawal [F10.239] 01/08/2015  . GAD (generalized anxiety disorder) [F41.1] 12/24/2013  . Severe recurrent major depression without psychotic features [F33.2] 12/22/2013  . PTSD (post-traumatic stress disorder) [F43.10] 12/22/2013  . Alcohol dependence [F10.20] 12/21/2013    Total Time spent with patient: 1 hour  Subjective:   [name] is a 59 y.o. female patient admitted with "I feel terrible". Patient being admitted because of concerns about complicated alcohol withdrawal. She has recurrent severe depression with active suicidal ideation. Long history of alcohol abuse.Marland Kitchen  HPI:  Information obtained from the patient and the chart. Patient states that she came to the hospital because she's been feeling terrible for several days. She's been sick to her stomach and has not been able to keep food down and has thrown up repeatedly. She is feeling shaky all over. This despite the fact that she had been continuing to drink up until shortly for presenting to the emergency room. Patient has been drinking 6-12 beers a day by her estimate and this is been going on for years. She denies abusing any other drugs. Patient's mood is very sad and depressed. Active suicidal wishes and thoughts with thoughts of overdosing. No hallucinations. No evident delusions. Major stresses breakup with her boyfriend as well as his verbal abuse and her ongoing drinking. Has been compliant with prescribed medicines.  Past psychiatric history: History of alcohol abuse going back about 10  years. Has had several years of sobriety in the past. Current client at [facility] seeing Dr. Leonides Schanz and a substance abuse counselor. Positive history of suicide attempts in the past by overdose. Has been admitted to the hospital here as recently as this spring. Has been treated with antidepressives including most recently venlafaxine.  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 Although the stump appears to be outwardly healed she has chronic stump pain which is sharp and intermittent. Patient denies history of heart attack. She has had one prior alcohol withdrawal seizure. She takes medication for anxiety depression and chronic pain.  Social history: Not working. On disability. Lives now by herself. Recent breakup with her boyfriend. Chronic argument waiting between her and her 2 adult children which causes her great distress. No other active social support that she reports.  Family history: Positive for alcohol ab abuse.  Substance abuse history: Denies abusing anything but alcohol. Denies history of delirium tremens. Positive history of at least one alcohol withdrawal seizure she thinks. HPI Elements:   Quality:  Depressed mood, suicidal ideation, complicated alcohol withdrawal. Severity:  All severe and potentially life threatening. Timing:  Alcohol abuse for years current depressed mood worse over the last week or 2. Duration:  Ongoing. Context:  Recent breakup with her boyfriend chronic alcohol abuse and pain.  Past Medical History:  Past Medical History  Diagnosis Date  . Depression   . Anxiety   . Neuropathy   . Insomnia     Past Surgical History  Procedure Laterality Date  . Cesarean section    . Joint replacement Right   . Arm amputation  at shoulder Left   . Abdominal hysterectomy    . Right hip surgery     Family History:  Family History  Problem Relation Age of Onset  . COPD Mother   . Alcoholism Father     Social History:  History  Alcohol Use  . 14.4 oz/week  . 24 Cans of beer per week    Comment: 1 case per day     History  Drug Use No    Comment: Patient denies     History   Social History  . Marital Status: Divorced    Spouse Name: N/A  . Number of Children: N/A  . Years of Education: N/A   Social History Main Topics  . Smoking status: Current Every Day Smoker -- 0.50 packs/day    Types: Cigarettes  . Smokeless tobacco: Never Used  . Alcohol Use: 14.4 oz/week    24 Cans of beer per week     Comment: 1 case per day  . Drug Use: No     Comment: Patient denies   . Sexual Activity: Not on file   Other Topics Concern  . None   Social History Narrative   Additional Social History:    History of alcohol / drug use?: Yes Name of Substance 1: ETOH 1 - Age of First Use: 48 1 - Amount (size/oz): varies 1 - Frequency: daily 1 - Duration: years 1 - Last Use / Amount: 01/07/15- 1 case of beer                   Allergies:   Allergies  Allergen Reactions  . Benadryl [Diphenhydramine] Hives  . Cephalosporins Hives    Labs:  Results for orders placed or performed during the hospital encounter of 01/07/15 (from the past 48 hour(s))  Comprehensive metabolic panel     Status: Abnormal   Collection Time: 01/07/15  3:32 PM  Result Value Ref Range   Sodium 133 (L) 135 - 145 mmol/L   Potassium 3.5 3.5 - 5.1 mmol/L   Chloride 97 (L) 101 - 111 mmol/L   CO2 21 (L) 22 - 32 mmol/L   Glucose, Bld 74 65 - 99 mg/dL   BUN 14 6 - 20 mg/dL   Creatinine, Ser 0.82 0.44 - 1.00 mg/dL   Calcium 8.4 (L) 8.9 - 10.3 mg/dL   Total Protein 8.1 6.5 - 8.1 g/dL   Albumin 4.5 3.5 - 5.0 g/dL   AST 33 15 - 41 U/L   ALT 22 14 - 54 U/L   Alkaline Phosphatase 83 38 - 126 U/L   Total Bilirubin 0.4 0.3 - 1.2 mg/dL   GFR calc non Af Amer >60 >60 mL/min   GFR calc Af Amer >60 >60 mL/min    Comment: (NOTE) The eGFR has been calculated using the CKD EPI equation. This calculation has not  been validated in all clinical situations. eGFR's persistently <60 mL/min signify possible Chronic Kidney Disease.    Anion gap 15 5 - 15  Ethanol (ETOH)     Status: Abnormal   Collection Time: 01/07/15  3:32 PM  Result Value Ref Range   Alcohol, Ethyl (B) 201 (H) <5 mg/dL    Comment:        LOWEST DETECTABLE LIMIT FOR SERUM ALCOHOL IS 5 mg/dL FOR MEDICAL PURPOSES ONLY   Salicylate level     Status: None   Collection Time: 01/07/15  3:32 PM  Result Value Ref Range   Salicylate Lvl 8.1 2.8 -  30.0 mg/dL  Acetaminophen level     Status: Abnormal   Collection Time: 01/07/15  3:32 PM  Result Value Ref Range   Acetaminophen (Tylenol), Serum <10 (L) 10 - 30 ug/mL    Comment:        THERAPEUTIC CONCENTRATIONS VARY SIGNIFICANTLY. A RANGE OF 10-30 ug/mL MAY BE AN EFFECTIVE CONCENTRATION FOR MANY PATIENTS. HOWEVER, SOME ARE BEST TREATED AT CONCENTRATIONS OUTSIDE THIS RANGE. ACETAMINOPHEN CONCENTRATIONS >150 ug/mL AT 4 HOURS AFTER INGESTION AND >50 ug/mL AT 12 HOURS AFTER INGESTION ARE OFTEN ASSOCIATED WITH TOXIC REACTIONS.   CBC     Status: Abnormal   Collection Time: 01/07/15  3:32 PM  Result Value Ref Range   WBC 10.7 3.6 - 11.0 K/uL   RBC 5.32 (H) 3.80 - 5.20 MIL/uL   Hemoglobin 16.1 (H) 12.0 - 16.0 g/dL   HCT 47.4 (H) 35.0 - 47.0 %   MCV 89.1 80.0 - 100.0 fL   MCH 30.2 26.0 - 34.0 pg   MCHC 33.9 32.0 - 36.0 g/dL   RDW 13.4 11.5 - 14.5 %   Platelets 419 150 - 440 K/uL  Urine Drug Screen, Qualitative ([facility] only)     Status: None   Collection Time: 01/07/15  3:35 PM  Result Value Ref Range   Tricyclic, Ur Screen NONE DETECTED NONE DETECTED   Amphetamines, Ur Screen NONE DETECTED NONE DETECTED   MDMA (Ecstasy)Ur Screen NONE DETECTED NONE DETECTED   Cocaine Metabolite,Ur [place] NONE DETECTED NONE DETECTED   Opiate, Ur Screen NONE DETECTED NONE DETECTED   Phencyclidine (PCP) Ur S NONE DETECTED NONE DETECTED   Cannabinoid 50 Ng, Ur [place] NONE DETECTED NONE DETECTED    Barbiturates, Ur Screen NONE DETECTED NONE DETECTED   Benzodiazepine, Ur Scrn NONE DETECTED NONE DETECTED   Methadone Scn, Ur NONE DETECTED NONE DETECTED    Comment: (NOTE) 381  Tricyclics, urine               Cutoff 1000 ng/mL 200  Amphetamines, urine             Cutoff 1000 ng/mL 300  MDMA (Ecstasy), urine           Cutoff 500 ng/mL 400  Cocaine Metabolite, urine       Cutoff 300 ng/mL 500  Opiate, urine                   Cutoff 300 ng/mL 600  Phencyclidine (PCP), urine      Cutoff 25 ng/mL 700  Cannabinoid, urine              Cutoff 50 ng/mL 800  Barbiturates, urine             Cutoff 200 ng/mL 900  Benzodiazepine, urine           Cutoff 200 ng/mL 1000 Methadone, urine                Cutoff 300 ng/mL 1100 1200 The urine drug screen provides only a preliminary, unconfirmed 1300 analytical test result and should not be used for non-medical 1400 purposes. Clinical consideration and professional judgment should 1500 be applied to any positive drug screen result due to possible 1600 interfering substances. A more specific alternate chemical method 1700 must be used in order to obtain a confirmed analytical result.  1800 Gas chromato graphy / mass spectrometry (GC/MS) is the preferred 1900 confirmatory method.   Ethanol     Status: Abnormal   Collection Time: 01/07/15  8:44 PM  Result Value Ref Range   Alcohol, Ethyl (B) 61 (H) <5 mg/dL    Comment:        LOWEST DETECTABLE LIMIT FOR SERUM ALCOHOL IS 5 mg/dL FOR MEDICAL PURPOSES ONLY     Vitals: Blood pressure 141/76, pulse 138, temperature 97.5 F (36.4 C), temperature source Oral, resp. rate 18, height $RemoveBe'5\' 4"'gwPwTluwk$  (1.626 m), weight 62.143 kg (137 lb), SpO2 100 %.  Risk to Self: Suicidal Ideation: Yes-Currently Present Suicidal Intent: Yes-Currently Present Is patient at risk for suicide?: Yes Suicidal Plan?: Yes-Currently Present Specify Current Suicidal Plan: Attempt to harm self by throwing herself down stairs Access to Means:  Yes Specify Access to Suicidal Means: Access to stairs at home What has been your use of drugs/alcohol within the last 12 months?: ETOH How many times?: 3 Triggers for Past Attempts: Unpredictable Intentional Self Injurious Behavior: None Risk to Others: Homicidal Ideation: No Thoughts of Harm to Others: No Current Homicidal Intent: No Current Homicidal Plan: No Access to Homicidal Means: No Identified Victim: None History of harm to others?: No Assessment of Violence: None Noted Violent Behavior Description: Pt is calm and cooperative Does patient have access to weapons?: No Criminal Charges Pending?: No Does patient have a court date: No Prior Inpatient Therapy: Prior Inpatient Therapy: Yes Prior Therapy Dates: 2016 Prior Therapy Facilty/Provider(s): [facility] Reason for Treatment: Substance Abuse Prior Outpatient Therapy: Prior Outpatient Therapy: Yes Prior Therapy Dates: current Prior Therapy Facilty/Provider(s): [facility] Reason for Treatment: Med Management & Therapy Does patient have an ACCT team?: No Does patient have Intensive In-House Services?  : No Does patient have [facility] services? : No Does patient have P4[facility] services?: No  Current Facility-Administered Medications  Medication Dose Route Frequency Provider Last Rate Last Dose  . chlordiazePOXIDE (LIBRIUM) capsule 50 mg  50 mg Oral STAT Gonzella Lex, MD      . haloperidol lactate (HALDOL) injection 2 mg  2 mg Intravenous Q6H PRN Henreitta Leber, MD      . LORazepam (ATIVAN) 1 MG tablet        0 mg at 01/08/15 0538  . LORazepam (ATIVAN) 1 MG tablet           . LORazepam (ATIVAN) 2 MG tablet           . ondansetron (ZOFRAN) tablet 4 mg  4 mg Oral Once Gonzella Lex, MD      . promethazine (PHENERGAN) 25 MG tablet            Current Outpatient Prescriptions  Medication Sig Dispense Refill  . folic acid (FOLVITE) 1 MG tablet Take 1 mg by mouth daily.    Marland Kitchen gabapentin (NEURONTIN) 300 MG capsule Take 900 mg by mouth 3  (three) times daily.    . haloperidol (HALDOL) 2 MG tablet Take 2 mg by mouth daily as needed.    . Multiple Vitamin (MULTI-VITAMINS) TABS Take 1 tablet by mouth daily.    Marland Kitchen omeprazole (PRILOSEC) 10 MG capsule Take 10 mg by mouth daily.    . propranolol (INDERAL) 10 MG tablet Take 10 mg by mouth 3 (three) times daily.    Marland Kitchen thiamine 50 MG tablet Take 50 mg by mouth daily.    . trazodone (DESYREL) 300 MG tablet Take 300 mg by mouth at bedtime.    Marland Kitchen venlafaxine XR (EFFEXOR-XR) 150 MG 24 hr capsule Take 300 mg by mouth daily.      Musculoskeletal: Strength & Muscle Tone: decreased Gait & Station: unsteady Patient leans:  N/A  Psychiatric Specialty Exam: Physical Exam  Constitutional: She appears well-developed. She appears listless. She has a sickly appearance.  HENT:  Head: Normocephalic and atraumatic.  Eyes: Conjunctivae are normal. Pupils are equal, round, and reactive to light.  Neck: Normal range of motion.  Cardiovascular: Normal heart sounds.  Tachycardia present.   Respiratory: Effort normal.  GI: Soft.  Musculoskeletal: Normal range of motion.  Neurological: She appears listless. She displays tremor.  Skin: Skin is warm and dry.  Psychiatric: Her mood appears anxious. Her speech is delayed, tangential and slurred. She is slowed and withdrawn. Cognition and memory are impaired. She expresses impulsivity. She exhibits a depressed mood. She expresses suicidal ideation. She exhibits abnormal recent memory.    Review of Systems  HENT: Negative.   Eyes: Negative.   Respiratory: Negative.   Cardiovascular: Negative.   Gastrointestinal: Positive for nausea, vomiting and abdominal pain.  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 She has chronic recurrent pain in the stump.  Skin: Negative.   Neurological: Positive for dizziness, tremors and weakness.  Psychiatric/Behavioral: Positive for depression, suicidal ideas,  memory loss and substance abuse. Negative for hallucinations. The patient is nervous/anxious and has insomnia.     Blood pressure 141/76, pulse 138, temperature 97.5 F (36.4 C), temperature source Oral, resp. rate 18, height $RemoveBe'5\' 4"'mbZgRYdMl$  (1.626 m), weight 62.143 kg (137 lb), SpO2 100 %.Body mass index is 23.5 kg/(m^2).  General Appearance: Disheveled and Guarded  Eye Contact::  Minimal  Speech:  Garbled and Slow  Volume:  Decreased  Mood:  Depressed  Affect:  Depressed  Thought Process:  Tangential  Orientation:  Other:  Disoriented as to time and also disoriented to exactly which hospital she is in although she knows she is in the hospital  Thought Content:  Negative  Suicidal Thoughts:  Yes.  with intent/plan  Homicidal Thoughts:  No  Memory:  Immediate;   Fair Recent;   Poor Remote;   Fair  Judgement:  Impaired  Insight:  Shallow  Psychomotor Activity:  Decreased  Concentration:  Poor  Recall:  Poor  Fund of Knowledge:Poor  Language: Fair  Akathisia:  No  Handed:  Right  AIMS (if indicated):     Assets:  Housing Resilience  ADL's:  Intact  Cognition: Impaired,  Mild  Sleep:      Medical Decision Making: Review of Psycho-Social Stressors (1), Review or order clinical lab tests (1), Established Problem, Worsening (2), Review of Last Therapy Session (1), Review of Medication Regimen & Side Effects (2) and Review of New Medication or Change in Dosage (2)  Treatment Plan Summary: Daily contact with patient to assess and evaluate symptoms and progress in treatment, Medication management and Plan Patient is being admitted to the medical service because of the potential for delirium tremens. Acutely I have added a single 50 mg dose of Librium and a single 4 mg dose of Zofran for her acute complaints of nausea and tremor. I have also ordered a chest x-ray and an EKG because of her ongoing tachycardia as well as her complaints that she is having intermittent chest pain. Wondering if she could  have a rib fracture or other musculoskeletal injury but I want to make sure that there is nothing causing a problem with her lungs either. Patient will be continued on the CIWA protocol. I have reinitiated her involuntary commitment paperwork as she is still actively suicidal.  I have mentioned the possibility of going to the alcohol and drug abuse treatment center once she is sober and I will put in a consult for this on the unit. Continue follow-up and management. Restarted her outpatient medicines.  Plan:  Supportive therapy provided about ongoing stressors. Discussed crisis plan, support from social network, calling 911, coming to the Emergency Department, and calling Suicide Hotline. See note above. Follow on the medical unit consider the possibility of referral to substance abuse treatment once stable medically Disposition: See notes above  Almond Fitzgibbon 01/08/2015 11:21 AM

## 2015-01-08 NOTE — ED Provider Notes (Signed)
-----------------------------------------   9:10 AM on 01/08/2015 -----------------------------------------  Patient has CIWA which is elevated including a tachycardia now to 140s. She is received several doses of Ativan, and now given her increasing heart rate and persistently moderately elevated CIWA score we will admit the patient for ongoing medical care including detox and psychiatric evaluation.  Discussed with Dr. Posey Pronto.  Patient had EKG performed at 7:20 AM Reviewed and interpreted by me Sinus tachycardia with occasional PAC. No acute ST abnormalities. QTc 431 QRS 64 PR 160  No evidence of acute ischemic change.  Delman Kitten, MD 01/08/15 [phone]

## 2015-01-08 NOTE — ED Notes (Signed)
Pt. Noted in room.CIWA noted to be decreasing . Will continue to monitor with security cameras. Q 15 minute rounds continue.

## 2015-01-08 NOTE — ED Notes (Signed)
ED BHU [place] Is the patient under IVC or is there intent for IVC: No. Is the patient medically cleared: Yes.   Is there vacancy in the ED BHU: Yes.   Is the population mix appropriate for patient: Yes.   Is the patient awaiting placement in inpatient or outpatient setting: Yes.   Has the patient had a psychiatric consult: Yes.   Survey of unit performed for contraband, proper placement and condition of furniture, tampering with fixtures in bathroom, shower, and each patient room: Yes.  ; Findings:  APPEARANCE/BEHAVIOR cooperative NEURO ASSESSMENT Orientation: time, place, person Hallucinations: No.None noted (Hallucinations) Speech: Normal Gait: normal RESPIRATORY ASSESSMENT Normal expansion.  Clear to auscultation.  No rales, rhonchi, or wheezing. CARDIOVASCULAR ASSESSMENT regular rate and rhythm, S1, S2 normal, no murmur, click, rub or gallop and tachycardia GASTROINTESTINAL ASSESSMENT soft, nontender, BS WNL, no r/g EXTREMITIES deformities: pt with previous left above the elbow amputation PLAN OF CARE Provide calm/safe environment. Vital signs assessed twice daily. ED BHU Assessment once each 12-hour shift. Collaborate with intake RN daily or as condition indicates. Assure the ED provider has rounded once each shift. Provide and encourage hygiene. Provide redirection as needed. Assess for escalating behavior; address immediately and inform ED provider.  Assess family dynamic and appropriateness for visitation as needed: Yes.  ; If necessary, describe findings:  Educate the patient/family about BHU procedures/visitation: Yes.  ; If necessary, describe findings:

## 2015-01-08 NOTE — ED Notes (Signed)
Report to [employer], rn.

## 2015-01-08 NOTE — ED Notes (Signed)
Pt. Complaining of chest tightness, anxious, Dr.Quale aware , EKG ordred. Will continue to monitor with security cameras. Q 15 minute rounds continue.

## 2015-01-08 NOTE — ED Notes (Signed)
Patient assigned to appropriate care area. Patient oriented to unit/care area: Informed that, for their safety, care areas are designed for safety and monitored by security cameras at all times; and visiting hours explained to patient. Patient verbalizes understanding, and verbal contract for safety obtained. 

## 2015-01-08 NOTE — ED Notes (Signed)
Report from anne cales, rn. Pt escorted to bhu. Pt with sweaty skin, tremors noted to arm. Pt with tachycardia. Reviewed orders, ciwa obtained. Will notify md kaminiski of ciwa value and pt symptoms, vital signs.

## 2015-01-08 NOTE — ED Notes (Signed)
Pt. Had a CIWA score of 13 at 5:14am.  Pt. Was given 1 mg ativan IM and 1 mg of ativan PO.

## 2015-01-08 NOTE — H&P (Signed)
[facility] [place] NAME: [name]    MR#:  [id number]  DATE OF BIRTH:  April 24, 1956  DATE OF ADMISSION:  01/07/2015  PRIMARY CARE PHYSICIAN: No primary care provider on file.   REQUESTING/REFERRING PHYSICIAN: Dr. Delman Kitten  CHIEF COMPLAINT:   Chief Complaint  Patient presents with  . Suicide Attempt  Alcohol Withdrawal.   HISTORY OF PRESENT ILLNESS:  [name]  is a 59 y.o. female with a known history of depression, anxiety, neuropathy, insomnia who presented to the hospital due to alcohol withdrawal and also suicidal ideations. Patient apparently has been getting high doses of oral Ativan for alcohol withdrawal syndrome continues to be tachycardic. Hospitalist services were contacted for admission for inpatient alcohol detoxification. Patient also has suicidal ideations and she attempted to kill herself by falling down the stairs. Her IVC was lifted by the psychiatrist earlier but it needs to be reinstated as she continues to be suicidal. Patient now is being admitted for further management of her alcohol withdrawal.  PAST MEDICAL HISTORY:   Past Medical History  Diagnosis Date  . Depression   . Anxiety   . Neuropathy   . Insomnia     PAST SURGICAL HISTORY:   Past Surgical History  Procedure Laterality Date  . Cesarean section    . Joint replacement Right   . 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 Abdominal hysterectomy    . Right hip surgery      SOCIAL HISTORY:   History  Substance Use Topics  . Smoking status: Current Every Day Smoker -- 0.50 packs/day    Types: Cigarettes  . Smokeless tobacco: Never Used  . Alcohol Use: 14.4 oz/week    24 Cans of beer per week     Comment: 1 case per day    FAMILY HISTORY:   Family History  Problem Relation Age of Onset  . COPD Mother   . Alcoholism Father     DRUG ALLERGIES:   Allergies  Allergen Reactions  . Benadryl [Diphenhydramine] Hives  .  Cephalosporins Hives    REVIEW OF SYSTEMS:   Review of Systems  Constitutional: Negative for fever and weight loss.  HENT: Negative for congestion, nosebleeds and tinnitus.   Eyes: Negative for blurred vision, double vision and redness.  Respiratory: Negative for cough, hemoptysis and shortness of breath.   Cardiovascular: Positive for chest pain. Negative for palpitations, orthopnea, leg swelling and PND.  Gastrointestinal: Negative for nausea, vomiting, abdominal pain, diarrhea and melena.  Genitourinary: Negative for dysuria, urgency and hematuria.  Musculoskeletal: Negative for joint pain and falls.  Neurological: Negative for dizziness, tingling, sensory change, focal weakness, seizures, weakness and headaches.  Endo/Heme/Allergies: Negative for polydipsia. Does not bruise/bleed easily.  Psychiatric/Behavioral: Positive for suicidal ideas and substance abuse (ETOH). Negative for depression and memory loss. The patient is nervous/anxious and has insomnia.     MEDICATIONS AT HOME:   Prior to Admission medications   Medication Sig Start Date End Date Taking? Authorizing Provider  docusate sodium 100 MG CAPS Take 100 mg by mouth 2 (two) times daily. (May purchase from over the counter at yr. Local pharmacy): For constipation 12/26/13   Encarnacion Slates, NP  gabapentin (NEURONTIN) 400 MG capsule Take 2 capsules (800 mg total) by mouth 3 (three) times daily. For substance withdrawal syndrome 12/26/13   Encarnacion Slates, NP  propranolol (INDERAL) 10 MG tablet Take 1 tablet (10 mg total) by mouth 2 (two)  times daily. For anxiety 12/26/13   Encarnacion Slates, NP  QUEtiapine (SEROQUEL) 25 MG tablet Take 1 tablet (25 mg total) by mouth every 6 (six) hours as needed (anxiety, agitation). 12/26/13   Encarnacion Slates, NP  traZODone (DESYREL) 100 MG tablet Take 2 tablets (200 mg total) by mouth at bedtime. For sleep 12/26/13   Encarnacion Slates, NP  venlafaxine XR (EFFEXOR-XR) 150 MG 24 hr capsule Take 1 capsule (150 mg  total) by mouth daily with breakfast. For depression 12/26/13   Encarnacion Slates, NP      VITAL SIGNS:  Blood pressure 144/66, pulse 140, temperature 97.5 F (36.4 C), temperature source Oral, resp. rate 18, height 5\' 4"  (1.626 m), weight 62.143 kg (137 lb), SpO2 100 %.  PHYSICAL EXAMINATION:  Physical Exam  GENERAL:  59 y.o.-year-old patient lying in the bed anxious & tremulous.  EYES: Pupils equal, round, reactive to light and accommodation. No scleral icterus. Extraocular muscles intact.  HEENT: Head atraumatic, normocephalic. Oropharynx and nasopharynx clear. No oropharyngeal erythema, moist oral mucosa  NECK:  Supple, no jugular venous distention. No thyroid enlargement, no tenderness.  LUNGS: Normal breath sounds bilaterally, no wheezing, rales, rhonchi. No use of accessory muscles of respiration.  CARDIOVASCULAR: S1, S2 RRR. No murmurs, rubs, gallops, clicks.  ABDOMEN: Soft, nontender, nondistended. Bowel sounds present. No organomegaly or mass.  EXTREMITIES: No pedal edema, cyanosis, or clubbing. + 2 pedal & radial pulses b/l.   NEUROLOGIC: Cranial nerves II through XII are intact. No focal Motor or sensory deficits appreciated b/l PSYCHIATRIC: The patient is alert and oriented x 3. Anxious, Suicidal SKIN: No obvious rash, lesion, or ulcer.   LABORATORY PANEL:   CBC  Recent Labs Lab 01/07/15 1532  WBC 10.7  HGB 16.1*  HCT 47.4*  PLT 419   ------------------------------------------------------------------------------------------------------------------  Chemistries   Recent Labs Lab 01/07/15 1532  NA 133*  K 3.5  CL 97*  CO2 21*  GLUCOSE 74  BUN 14  CREATININE 0.82  CALCIUM 8.4*  AST 33  ALT 22  ALKPHOS 83  BILITOT 0.4   ------------------------------------------------------------------------------------------------------------------  Cardiac Enzymes No results for input(s): TROPONINI in the last 168  hours. ------------------------------------------------------------------------------------------------------------------  RADIOLOGY:  Dg Cervical Spine 2-3 Views  01/07/2015   CLINICAL DATA:  Fall downstairs this morning with persistent posterior neck pain  EXAM: CERVICAL SPINE - 2-3 VIEW  COMPARISON:  None.  FINDINGS: Seven cervical segments are well visualized. Vertebral body height is well maintained. 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 Osteophytic changes are noted from C4-C7. Mild facet hypertrophic changes are noted as well. No acute fracture or acute facet abnormality is noted.  IMPRESSION: Degenerative change without acute abnormality.   Electronically Signed   By: Inez Catalina M.D.   On: 01/07/2015 16:32     IMPRESSION AND PLAN:   59 year old female with past medical history of depression, anxiety, insomnia, neuropathy who presents to the hospital due to alcohol withdrawal/suicidal ideations.  #1 alcohol withdrawal-patient apparently was drinking a case of beer daily for the past week. Her last drink was 2 days ago. -Patient is quite anxious tremulous tachycardic. She has been receiving high doses of oral Ativan in the emergency room and now is being admitted for further management of alcohol withdrawal. -I will start her on CIWA protocol. Continue when necessary Ativan/Haldol. -Follow clinically.  #2 depression/suicidal ideations-I will place a psychiatry consult. -One to one sitter. Continue further management as per psychiatry. -Patient apparently  is on antidepressants but has not taken any meds since August of last year.    All the records are reviewed and case discussed with ED provider. Management plans discussed with the patient, family and they are in agreement.  CODE STATUS: Full  TOTAL TIME TAKING CARE OF THIS PATIENT: 50 minutes.    Henreitta Leber M.D on 01/08/2015 at 9:57 AM  Between 7am to 6pm - Pager - [phone]  After 6pm  go to [email or web address] - password [email or web address] [name]  [facility]  Office  [phone]  CC: Primary care physician; No primary care provider on file.

## 2015-01-08 NOTE — ED Notes (Signed)
Pt. Up and using bathroom with steady gait.  Pt. Came to door after using bathroom asking when she could have medication.  Pt. Hands are shaking.

## 2015-01-08 NOTE — ED Notes (Signed)
Sandwich and soft drink given.  

## 2015-01-08 NOTE — ED Provider Notes (Signed)
-----------------------------------------   5:40 AM on 01/08/2015 -----------------------------------------   BP 174/77 mmHg  Pulse 129  Temp(Src) 98 F (36.7 C) (Oral)  Resp 18  Ht 5\' 4"  (1.626 m)  Wt 137 lb (62.143 kg)  BMI 23.50 kg/m2  SpO2 96%  The patient has been having ongoing problems with alcohol withdrawal. She has been tachycardic from 105-140 bpm. She improves with Ativan. We are continuing to closely monitor with the CIWA protocol. She just received 1 mg of Ativan IM by protocol, and I have asked the nurse to give her 1 more milligram by mouth.  Continue care as specified by psychiatry. Disposition is pending per Psychiatry/Behavioral Medicine team recommendations.      Ahmed Prima, MD 01/08/15 [phone]

## 2015-01-08 NOTE — ED Notes (Signed)
Pt. Alert and oriented,anxious, on CIWA. Pt. Denies SI, HI, and AVH. Pt. Encouraged to let nursing staff know of any concerns or needs.

## 2015-01-08 NOTE — ED Notes (Signed)
Pt resting at this time. Pt with less sweating noted to forearm, no sweat visible at this time on forehead. Pt states feels better, no nausea presently. Tremors continue to be noted to arm, but are decreased.

## 2015-01-08 NOTE — ED Notes (Signed)
BEHAVIORAL HEALTH ROUNDING Patient sleeping: Yes.   Patient alert and oriented: yes Behavior appropriate: Yes.  ; If no, describe:  Nutrition and fluids offered: Yes  Toileting and hygiene offered: Yes  Sitter present: no Law enforcement present: Yes  

## 2015-01-08 NOTE — ED Notes (Signed)
Notified department of patients status and arrival.

## 2015-01-09 LAB — BASIC METABOLIC PANEL
ANION GAP: 9 (ref 5–15)
BUN: 11 mg/dL (ref 6–20)
CHLORIDE: 108 mmol/L (ref 101–111)
CO2: 23 mmol/L (ref 22–32)
Calcium: 8.2 mg/dL — ABNORMAL LOW (ref 8.9–10.3)
Creatinine, Ser: 0.69 mg/dL (ref 0.44–1.00)
Glucose, Bld: 107 mg/dL — ABNORMAL HIGH (ref 65–99)
POTASSIUM: 3.7 mmol/L (ref 3.5–5.1)
SODIUM: 140 mmol/L (ref 135–145)

## 2015-01-09 LAB — CBC
HCT: 40.5 % (ref 35.0–47.0)
Hemoglobin: 13.8 g/dL (ref 12.0–16.0)
MCH: 30.9 pg (ref 26.0–34.0)
MCHC: 34 g/dL (ref 32.0–36.0)
MCV: 91 fL (ref 80.0–100.0)
PLATELETS: 257 10*3/uL (ref 150–440)
RBC: 4.46 MIL/uL (ref 3.80–5.20)
RDW: 13.8 % (ref 11.5–14.5)
WBC: 7.5 10*3/uL (ref 3.6–11.0)

## 2015-01-09 MED ORDER — LORAZEPAM 2 MG PO TABS: 0.0000 mg | ORAL_TABLET | Freq: Two times a day (BID) | ORAL | Status: DC

## 2015-01-09 MED ORDER — LORAZEPAM 2 MG PO TABS
0.0000 mg | ORAL_TABLET | Freq: Four times a day (QID) | ORAL | Status: DC
  Administered 2015-01-09 (×2): 2 mg via ORAL
  Filled 2015-01-09 (×2): qty 1

## 2015-01-09 MED ORDER — THIAMINE HCL 100 MG/ML IJ SOLN: 100.0000 mg | Freq: Every day | INTRAMUSCULAR | Status: DC

## 2015-01-09 MED ORDER — VITAMIN B-1 100 MG PO TABS: 100.0000 mg | ORAL_TABLET | Freq: Every day | ORAL | Status: DC

## 2015-01-09 MED ORDER — LORAZEPAM 2 MG/ML IJ SOLN
2.0000 mg | Freq: Once | INTRAMUSCULAR | Status: AC
  Administered 2015-01-09: 2 mg via INTRAVENOUS
  Filled 2015-01-09: qty 1

## 2015-01-09 MED ORDER — VITAMIN B-1 100 MG PO TABS
100.0000 mg | ORAL_TABLET | Freq: Every day | ORAL | Status: DC
  Administered 2015-01-09 – 2015-01-10 (×2): 100 mg via ORAL
  Filled 2015-01-09: qty 1

## 2015-01-09 NOTE — Progress Notes (Signed)
Initial Nutrition Assessment      INTERVENTION:   Meals and snacks: Cater to pt preferences Nutrition Supplement Therapy: Will add mightyshake BID for added nutrition  NUTRITION DIAGNOSIS:   Inadequate oral intake related to acute illness as evidenced by per patient/family report.    GOAL:   Patient will meet greater than or equal to 90% of their needs    MONITOR:    (Energy intake, Digestive system, )  REASON FOR ASSESSMENT:   Malnutrition Screening Tool    ASSESSMENT:   Pt admitted with etoh withdrawal, sucide attempt  Past Medical History  Diagnosis Date  . Depression   . Anxiety   . Neuropathy   . Insomnia     Current Nutrition: ate 25% of lunch today and 100% breakfast.    Food/Nutrition-Related History: Pt reports poor po intake prior to admission for the past week, only eating bites per pt   Medications: NS at 134ml/hr, folate, MVI, thiamin  Electrolyte/Renal Profile and Glucose Profile:   Recent Labs Lab 01/07/15 1532 01/09/15 0435  NA 133* 140  K 3.5 3.7  CL 97* 108  CO2 21* 23  BUN 14 11  CREATININE 0.82 0.69  CALCIUM 8.4* 8.2*  GLUCOSE 74 107*   Protein Profile:  Recent Labs Lab 01/07/15 1532  ALBUMIN 4.5     Last BM: 7/16   Nutrition-Focused Physical Exam Findings: Nutrition-Focused physical exam completed. Findings are WDL for fat depletion, muscle depletion, and edema.     Weight Change: noted 8% weight loss in the last 2 weeks per wt encounters    Diet Order:  Diet regular Room service appropriate?: Yes; Fluid consistency:: Thin  Skin:  Reviewed, no issues   Height:   Ht Readings from Last 1 Encounters:  01/07/15 5\' 4"  (1.626 m)    Weight:   Wt Readings from Last 1 Encounters:  01/07/15 137 lb (62.143 kg)       Wt Readings from Last 10 Encounters:  01/07/15 137 lb (62.143 kg)  12/21/13 149 lb (67.586 kg)    BMI:  Body mass index is 23.5 kg/(m^2).  Estimated Nutritional Needs:   Kcal:  BEE  1175 kcals (AF 1.3)1527 kcals/d.   Protein:  (1.0 g/kg) 62g/d  Fluid:  (25-25ml/kg) 1550-1820ml/d  EDUCATION NEEDS:   No education needs identified at this time  [place]. Zenia Resides, [place], [place] (pager)

## 2015-01-09 NOTE — Consult Note (Signed)
[facility] Consult   Reason for Consult:  Follow-up for this 59 year old woman with alcohol abuse and depression Referring Physician:  Verdell Carmine Patient Identification: RALPH BENAVIDEZ MRN:  [id number] Principal Diagnosis: Alcohol withdrawal Diagnosis:   Patient Active Problem List   Diagnosis Date Noted  . Alcohol withdrawal [F10.239] 01/08/2015  . Amputation stump pain [T87.89] 01/08/2015  . 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 GAD (generalized anxiety disorder) [F41.1] 12/24/2013  . Severe recurrent major depression without psychotic features [F33.2] 12/22/2013  . PTSD (post-traumatic stress disorder) [F43.10] 12/22/2013  . Alcohol dependence [F10.20] 12/21/2013    Total Time spent with patient: 30 minutes  Subjective:   INOLA LISLE is a 59 y.o. female patient admitted with patient was admitted through the emergency room with alcohol withdrawal also depression. Was reporting having suicidal ideation. Today she reports she is feeling much better and denies suicidal ideation.Marland Kitchen  HPI:  Today on the interview the patient says she is feeling much better. She is still tired but she blames this on the Ativan. She has felt a little sick to her stomach but has not thrown up today. Her mood is feeling better. She feels optimistic. Denies any thoughts about wishing to die or wanting to kill her self. Denies any hallucinations. Patient is able to articulate an optimistic program for staying sober in the upcoming future. She is requesting discharge so that she can go back to spend time with her family soon. HPI Elements:   Quality:  Depressed mood also alcohol withdrawal. Severity:  Moderate and now resolving. Timing:  Worse when she is drinking. Duration:  Chronic problem. Context:  Recent alcohol abuse and treatment noncompliance.  Past Medical History:  Past Medical History  Diagnosis Date  . Depression   . Anxiety   . Neuropathy   .  Insomnia     Past Surgical History  Procedure Laterality Date  . Cesarean section    . Joint replacement Right   . 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 Abdominal hysterectomy    . Right hip surgery     Family History:  Family History  Problem Relation Age of Onset  . COPD Mother   . Alcoholism Father    Social History:  History  Alcohol Use  . 14.4 oz/week  . 24 Cans of beer per week    Comment: 1 case per day     History  Drug Use No    Comment: Patient denies     History   Social History  . Marital Status: Divorced    Spouse Name: N/A  . Number of Children: N/A  . Years of Education: N/A   Social History Main Topics  . Smoking status: Current Every Day Smoker -- 0.50 packs/day    Types: Cigarettes  . Smokeless tobacco: Never Used  . Alcohol Use: 14.4 oz/week    24 Cans of beer per week     Comment: 1 case per day  . Drug Use: No     Comment: Patient denies   . Sexual Activity: Not on file   Other Topics Concern  . None   Social History Narrative   Additional Social History:    History of alcohol / drug use?: Yes Name of Substance 1: ETOH 1 - Age of First Use: 48 1 - Amount (size/oz): varies 1 - Frequency: daily 1 - Duration: years 1 - Last Use / Amount: 01/07/15- 1 case of beer  Allergies:   Allergies  Allergen Reactions  . Benadryl [Diphenhydramine] Hives  . Cephalosporins Hives    Labs:  Results for orders placed or performed during the hospital encounter of 01/07/15 (from the past 48 hour(s))  Ethanol     Status: Abnormal   Collection Time: 01/07/15  8:44 PM  Result Value Ref Range   Alcohol, Ethyl (B) 61 (H) <5 mg/dL    Comment:        LOWEST DETECTABLE LIMIT FOR SERUM ALCOHOL IS 5 mg/dL FOR MEDICAL PURPOSES ONLY   Basic metabolic panel     Status: Abnormal   Collection Time: 01/09/15  4:35 AM  Result Value Ref Range   Sodium 140 135 - 145 mmol/L   Potassium 3.7 3.5 - 5.1 mmol/L   Chloride 108 101 - 111  mmol/L   CO2 23 22 - 32 mmol/L   Glucose, Bld 107 (H) 65 - 99 mg/dL   BUN 11 6 - 20 mg/dL   Creatinine, Ser 0.69 0.44 - 1.00 mg/dL   Calcium 8.2 (L) 8.9 - 10.3 mg/dL   GFR calc non Af Amer >60 >60 mL/min   GFR calc Af Amer >60 >60 mL/min    Comment: (NOTE) The eGFR has been calculated using the CKD EPI equation. This calculation has not been validated in all clinical situations. eGFR's persistently <60 mL/min signify possible Chronic Kidney Disease.    Anion gap 9 5 - 15  CBC     Status: None   Collection Time: 01/09/15  4:35 AM  Result Value Ref Range   WBC 7.5 3.6 - 11.0 K/uL   RBC 4.46 3.80 - 5.20 MIL/uL   Hemoglobin 13.8 12.0 - 16.0 g/dL   HCT 40.5 35.0 - 47.0 %   MCV 91.0 80.0 - 100.0 fL   MCH 30.9 26.0 - 34.0 pg   MCHC 34.0 32.0 - 36.0 g/dL   RDW 13.8 11.5 - 14.5 %   Platelets 257 150 - 440 K/uL    Vitals: Blood pressure 114/58, pulse 87, temperature 98.7 F (37.1 C), temperature source Oral, resp. rate 20, height _0  (1.626 m), weight 62.143 kg (137 lb), SpO2 95 %.  Risk to Self: Suicidal Ideation: Yes-Currently Present Suicidal Intent: Yes-Currently Present Is patient at risk for suicide?: Yes Suicidal Plan?: Yes-Currently Present Specify Current Suicidal Plan: Attempt to harm self by throwing herself down stairs Access to Means: Yes Specify Access to Suicidal Means: Access to stairs at home What has been your use of drugs/alcohol within the last 12 months?: ETOH How many times?: 3 Triggers for Past Attempts: Unpredictable Intentional Self Injurious Behavior: None Risk to Others: Homicidal Ideation: No Thoughts of Harm to Others: No Current Homicidal Intent: No Current Homicidal Plan: No Access to Homicidal Means: No Identified Victim: None History of harm to others?: No Assessment of Violence: None Noted Violent Behavior Description: Pt is calm and cooperative Does patient have access to weapons?: No Criminal Charges Pending?: No Does patient have a  court date: No Prior Inpatient Therapy: Prior Inpatient Therapy: Yes Prior Therapy Dates: 2016 Prior Therapy Facilty/Provider(s): [facility] Reason for Treatment: Substance Abuse Prior Outpatient Therapy: Prior Outpatient Therapy: Yes Prior Therapy Dates: current Prior Therapy Facilty/Provider(s): [facility] Reason for Treatment: Med Management & Therapy Does patient have an ACCT team?: No Does patient have Intensive In-House Services?  : No Does patient have [facility] services? : No Does patient have P4CC services?: No  Current Facility-Administered Medications  Medication Dose Route Frequency Provider Last Rate Last  Dose  . 0.9 %  sodium chloride infusion   Intravenous Continuous Henreitta Leber, MD 100 mL/hr at 01/09/15 1138 1,000 mL at 01/09/15 1138  . acetaminophen (TYLENOL) tablet 650 mg  650 mg Oral Q6H PRN Henreitta Leber, MD       Or  . acetaminophen (TYLENOL) suppository 650 mg  650 mg Rectal Q6H PRN Henreitta Leber, MD      . folic acid (FOLVITE) tablet 1 mg  1 mg Oral Daily Henreitta Leber, MD   1 mg at 01/09/15 1027  . gabapentin (NEURONTIN) capsule 900 mg  900 mg Oral TID Gonzella Lex, MD   900 mg at 01/09/15 1708  . haloperidol lactate (HALDOL) injection 2 mg  2 mg Intravenous Q6H PRN Henreitta Leber, MD      . multivitamin with minerals tablet 1 tablet  1 tablet Oral Daily Henreitta Leber, MD   1 tablet at 01/09/15 1027  . ondansetron (ZOFRAN) injection 4 mg  4 mg Intravenous Q6H PRN Henreitta Leber, MD   4 mg at 01/08/15 2019  . propranolol (INDERAL) tablet 10 mg  10 mg Oral TID Gonzella Lex, MD   10 mg at 01/09/15 1709  . thiamine (VITAMIN B-1) tablet 100 mg  100 mg Oral Daily Henreitta Leber, MD   100 mg at 01/09/15 1045   Or  . thiamine (B-1) 100 mg in sodium chloride 0.9 % 50 mL IVPB  100 mg Intravenous Daily Henreitta Leber, MD      . traZODone (DESYREL) tablet 300 mg  300 mg Oral QHS Gonzella Lex, MD   300 mg at 01/08/15 2133  . venlafaxine XR (EFFEXOR-XR) 24 hr capsule  225 mg  225 mg Oral Q breakfast Gonzella Lex, MD   225 mg at 01/09/15 1027    Musculoskeletal: Strength & Muscle Tone: within normal limits Gait & Station: normal Patient leans: N/A  Psychiatric Specialty Exam: Physical Exam  Constitutional: She appears well-developed and well-nourished.  HENT:  Head: Normocephalic and atraumatic.  Eyes: Conjunctivae are normal. Pupils are equal, round, and reactive to light.  Neck: Normal range of motion.  Cardiovascular: Normal heart sounds.   Respiratory: Effort normal.  GI: Soft.  Musculoskeletal: Normal range of motion.  Neurological: She is alert.  Skin: Skin is warm and dry.  Psychiatric: She has a normal mood and affect. Her speech is normal. Judgment and thought content normal. She is withdrawn. Cognition and memory are normal.    Review of Systems  Constitutional: Positive for malaise/fatigue.  HENT: Negative.   Eyes: Negative.   Respiratory: Negative.   Cardiovascular: Negative.   Gastrointestinal: Positive for nausea.  Musculoskeletal: Negative.   Skin: Negative.   Neurological: Negative.   Psychiatric/Behavioral: Positive for substance abuse. Negative for depression, suicidal ideas and hallucinations. The patient is nervous/anxious.     Blood pressure 114/58, pulse 87, temperature 98.7 F (37.1 C), temperature source Oral, resp. rate 20, height _0  (1.626 m), weight 62.143 kg (137 lb), SpO2 95 %.Body mass index is 23.5 kg/(m^2).  General Appearance: Disheveled  Eye Contact::  Good  Speech:  Slow  Volume:  Normal  Mood:  Euthymic  Affect:  Blunt  Thought Process:  Goal Directed  Orientation:  Full (Time, Place, and Person)  Thought Content:  Negative  Suicidal Thoughts:  No  Homicidal Thoughts:  No  Memory:  Immediate;   Good Recent;   Fair Remote;   Fair  Judgement:  Intact  Insight:  Present  Psychomotor Activity:  Decreased  Concentration:  Fair  Recall:  [employer] [facility] [place]  Language: Fair   Akathisia:  No  Handed:  Right  AIMS (if indicated):     Assets:  Communication Skills Desire for Improvement Housing Resilience  ADL's:  Intact  Cognition: WNL  Sleep:      Medical Decision Making: Established Problem, Stable/Improving (1), Review of Psycho-Social Stressors (1), Review or order clinical lab tests (1), Review of Medication Regimen & Side Effects (2) and Review of New Medication or Change in Dosage (2)  Treatment Plan Summary: Medication management and Plan Patient appears to be much better in terms of her depression. No longer meets commitment criteria. Convincingly reports no suicidal ideation. Because of this I'm discontinuing her involuntary commitment. I have also discontinued the suicide precautions and the sitter. I will discontinue the protocol in the Ativan now and see if she continues to be stable tomorrow. I expect she probably will as her vital signs are currently stable and there is no sign of delirium. If she is looking okay tomorrow I think she can be discharged. There is no need for psychiatric hospitalization. We have discussed her outpatient treatment plan including her intention to get back involved with alcoholics anonymous and to go to [facility] at [facility]. Continue current medicine.  Plan:  Patient does not meet criteria for psychiatric inpatient admission. Supportive therapy provided about ongoing stressors. Refer to IOP. Discussed crisis plan, support from social network, calling 911, coming to the Emergency Department, and calling Suicide Hotline. Disposition: Continue monitoring overnight and anticipate likely discharge tomorrow  Alethia Berthold 01/09/2015 5:23 PM

## 2015-01-09 NOTE — Progress Notes (Signed)
[place] [facility] [place] NAME: [name]    MR#:  [id number]  DATE OF BIRTH:  [birth year]  SUBJECTIVE:  CHIEF COMPLAINT:   Chief Complaint  Patient presents with  . Suicide Attempt   Patient admitted yesterday due to alcohol withdrawal/suicidal ideations. Patient's tremors and cardia have improved. She presently denies suicidal ideations. Sitter at bedside  REVIEW OF SYSTEMS:    Review of Systems  Constitutional: Negative for fever and chills.  HENT: Negative for congestion and tinnitus.   Eyes: Negative for blurred vision and double vision.  Respiratory: Negative for cough, shortness of breath and wheezing.   Cardiovascular: Negative for chest pain, orthopnea and PND.  Gastrointestinal: Negative for nausea, vomiting, abdominal pain and diarrhea.  Genitourinary: Negative for dysuria and hematuria.  Neurological: Negative for dizziness, sensory change and focal weakness.  Psychiatric/Behavioral: Negative for suicidal ideas. The patient is not nervous/anxious.   All other systems reviewed and are negative.   Nutrition: Regular Tolerating Diet: Yes Tolerating PT: Ambulatory  DRUG ALLERGIES:   Allergies  Allergen Reactions  . Benadryl [Diphenhydramine] Hives  . Cephalosporins Hives    VITALS:  Blood pressure 105/41, pulse 77, temperature 98.2 F (36.8 C), temperature source Oral, resp. rate 20, height 5\' 4"  (1.626 m), weight 62.143 kg (137 lb), SpO2 98 %.  PHYSICAL EXAMINATION:   Physical Exam  GENERAL:  59 y.o.-year-old patient sitting up in the bed with no acute distress.  EYES: Pupils equal, round, reactive to light and accommodation. No scleral icterus. Extraocular muscles intact.  HEENT: Head atraumatic, normocephalic. Oropharynx and nasopharynx clear.  NECK:  Supple, no jugular venous distention. No thyroid enlargement, no tenderness.  LUNGS: Normal breath sounds bilaterally, no wheezing, rales, rhonchi. No use of  accessory muscles of respiration.  CARDIOVASCULAR: S1, S2 normal. No murmurs, rubs, or gallops.  ABDOMEN: Soft, nontender, nondistended. Bowel sounds present. No organomegaly or mass.  EXTREMITIES: No cyanosis, clubbing or edema b/l.    NEUROLOGIC: Cranial nerves II through XII are intact. No focal Motor or sensory deficits b/l.   PSYCHIATRIC: The patient is alert and oriented x 3.  SKIN: No obvious rash, lesion, or ulcer.    LABORATORY PANEL:   CBC  Recent Labs Lab 01/09/15 0435  WBC 7.5  HGB 13.8  HCT 40.5  PLT 257   ------------------------------------------------------------------------------------------------------------------  Chemistries   Recent Labs Lab 01/07/15 1532 01/09/15 0435  NA 133* 140  K 3.5 3.7  CL 97* 108  CO2 21* 23  GLUCOSE 74 107*  BUN 14 11  CREATININE 0.82 0.69  CALCIUM 8.4* 8.2*  AST 33  --   ALT 22  --   ALKPHOS 83  --   BILITOT 0.4  --    ------------------------------------------------------------------------------------------------------------------  Cardiac Enzymes No results for input(s): TROPONINI in the last 168 hours. ------------------------------------------------------------------------------------------------------------------  RADIOLOGY:  Dg Chest 2 View  01/08/2015   CLINICAL DATA:  Chest pain  EXAM: CHEST  2 VIEW  COMPARISON:  08/24/2008  FINDINGS: Normal heart size and mediastinal contours. No acute infiltrate or edema. No effusion or pneumothorax. Remote amputation of the left upper extremity just above the elbow. No acute osseous findings.  IMPRESSION: No active cardiopulmonary disease.   Electronically Signed   By: Monte Fantasia M.D.   On: 01/08/2015 11:55   Dg Cervical Spine 2-3 Views  01/07/2015   CLINICAL DATA:  Fall downstairs this morning with persistent posterior neck pain  EXAM: CERVICAL SPINE - 2-3 VIEW  COMPARISON:  None.  FINDINGS: Seven cervical segments are well visualized. Vertebral body height is well  maintained. 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 Osteophytic changes are noted from C4-C7. Mild facet hypertrophic changes are noted as well. No acute fracture or acute facet abnormality is noted.  IMPRESSION: Degenerative change without acute abnormality.   Electronically Signed   By: Inez Catalina M.D.   On: 01/07/2015 16:32     ASSESSMENT AND PLAN:   59 year old female with past medical history of depression, anxiety, insomnia, neuropathy who presents to the hospital due to alcohol withdrawal/suicidal ideations.  #1 alcohol withdrawal-patient apparently was drinking a case of beer daily for the past week. Her last drink was 3 days ago. -cont. CIWA protocol and clinically improved since yesterday. Which from IV Ativan to oral Ativan today. -Continue when necessary Haldol for agitation. -Continue substance abuse referral upon discharge.  #2 depression/suicidal ideations-patient has been seen by psychiatry. Denies any suicidal ideations presently.  -Discussed with Dr. Weber Cooks and he will come reevaluate patient today to see if she needs inpatient psychiatric services. -Continue sitter for now. Continue trazodone, Effexor.  #3 peripheral neuropathy-continue Neurontin.  #4 history of migraines-continue Inderal    All the records are reviewed and case discussed with Care Management/Social Workerr. Management plans discussed with the patient, family and they are in agreement.  CODE STATUS: Full  DVT Prophylaxis: Ambulatory  TOTAL TIME TAKING CARE OF THIS PATIENT: 30 minutes.   POSSIBLE D/C IN 1-2 DAYS, DEPENDING ON CLINICAL CONDITION.   Henreitta Leber M.D on 01/09/2015 at 11:12 AM  Between 7am to 6pm - Pager - [phone]  After 6pm go to [email or web address] - password [email or web address] [name]  [place] [facility]  Office  [phone]  CC: Primary care physician; No primary care provider on file.

## 2015-01-10 MED ORDER — LORAZEPAM 1 MG PO TABS
1.0000 mg | ORAL_TABLET | Freq: Once | ORAL | Status: AC
  Administered 2015-01-10: 1 mg via ORAL
  Filled 2015-01-10: qty 1

## 2015-01-10 MED ORDER — LORAZEPAM 1 MG PO TABS: 1.0000 mg | ORAL_TABLET | Freq: Three times a day (TID) | ORAL | Status: DC | PRN

## 2015-01-10 NOTE — Discharge Summary (Signed)
[place] [facility] [place] NAME: [name]    MR#:  [id number]  DATE OF BIRTH:  [birth year]  DATE OF ADMISSION:  01/07/2015 ADMITTING PHYSICIAN: Henreitta Leber, MD  DATE OF DISCHARGE: 01/10/2015 12:45 PM  PRIMARY CARE PHYSICIAN: No primary care provider on file.    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 Depression   . Anxiety   . Neuropathy   . Insomnia     HOSPITAL COURSE:   59 year old female with past medical history of depression, anxiety, insomnia, neuropathy who presents to the hospital due to alcohol withdrawal/suicidal ideations.  #1 alcohol withdrawal-this was a presenting diagnosis on admission given the patient's history of a week of binge drinking and then suddenly stopping 3 days prior to admission. -Patient was maintained on CIWA protocol and clinically responded well to it. She also received 1 dose of Librium while in the hospital. -Clinically she has improved and does not have any further tremors, tachycardia and therefore being discharged home. she was seen by psychiatry and they have provided her with information regarding detox as outpatient.  #2 depression/suicidal ideations-patient was initially suicidal on admission. Psychiatry consult was obtained and patient was seen by Dr. Weber Cooks. After his evaluation he did not think the patient was suicidal or needed inpatient psychiatric services. -Kennon Holter was discontinued and patient was discharged home with close follow-up with  her psychiatrist as outpatient.  -She will Continue trazodone, Effexor.  #3 peripheral neuropathy-patient was maintained on her Neurontin and she will resume that.  #4 history of migraines-she will continue her Inderal  DISCHARGE CONDITIONS:   Full  CONSULTS OBTAINED:  Treatment Team:  Gonzella Lex, MD  DRUG ALLERGIES:   Allergies  Allergen Reactions  . Benadryl [Diphenhydramine] Hives  . Cephalosporins Hives    DISCHARGE MEDICATIONS:   Discharge Medication List as of 01/10/2015 12:03 PM    START taking these medications   Details  LORazepam (ATIVAN) 1 MG tablet Take 1 tablet (1 mg total) by mouth every 8 (eight) hours as needed for anxiety., Starting 01/10/2015, Until Discontinued, Print      CONTINUE these medications which have NOT CHANGED   Details  folic acid (FOLVITE) 1 MG tablet Take 1 mg by mouth daily., Until Discontinued, Historical Med    gabapentin (NEURONTIN) 300 MG capsule Take 900 mg by mouth 3 (three) times daily., Until Discontinued, Historical Med    haloperidol (HALDOL) 2 MG tablet Take 2 mg by mouth daily as needed., Until Discontinued, Historical Med    Multiple Vitamin (MULTI-VITAMINS) TABS Take 1 tablet by mouth daily., Until Discontinued, Historical Med    omeprazole (PRILOSEC) 10 MG capsule Take 10 mg by mouth daily., Until Discontinued, Historical Med    propranolol (INDERAL) 10 MG tablet Take 10 mg by mouth 3 (three) times daily., Until Discontinued, Historical Med    thiamine 50 MG tablet Take 50 mg by mouth daily., Until Discontinued, Historical Med    trazodone (DESYREL) 300 MG tablet Take 300 mg by mouth at bedtime., Until Discontinued, Historical Med  venlafaxine XR (EFFEXOR-XR) 150 MG 24 hr capsule Take 300 mg by mouth daily., Until Discontinued, Historical Med         DISCHARGE INSTRUCTIONS:   DIET:  Regular diet  DISCHARGE CONDITION:  Stable  ACTIVITY:  Activity as tolerated  OXYGEN:  Home Oxygen: No.   Oxygen  Delivery: room air  DISCHARGE LOCATION:  home   If you experience worsening of your admission symptoms, develop shortness of breath, life threatening emergency, suicidal or homicidal thoughts you must seek medical attention immediately by calling 911 or calling your MD immediately  if symptoms less severe.  You Must read complete instructions/literature along with all the possible adverse reactions/side effects for all the Medicines you take and that have been prescribed to you. Take any new Medicines after you have completely understood and accpet all the possible adverse reactions/side effects.   Please note  You were cared for by a hospitalist during your hospital stay. If you have any questions about your discharge medications or the care you received while you were in the hospital after you are discharged, you can call the unit and asked to speak with the hospitalist on call if the hospitalist that took care of you is not available. Once you are discharged, your primary care physician will handle any further medical issues. Please note that NO REFILLS for any discharge medications will be authorized once you are discharged, as it is imperative that you return to your primary care physician (or establish a relationship with a primary care physician if you do not have one) for your aftercare needs so that they can reassess your need for medications and monitor your lab values.     Today   Tremors have improved. Tachycardia has improved. Denies any hallucinations, suicidal ideations presently.  VITAL SIGNS:  Blood pressure 108/48, pulse 81, temperature 98.3 F (36.8 C), temperature source Oral, resp. rate 16, height 5\' 4"  (1.626 m), weight 62.143 kg (137 lb), SpO2 100 %.  I/O:   Intake/Output Summary (Last 24 hours) at 01/10/15 1348 Last data filed at 01/10/15 1209  Gross per 24 hour  Intake   2857 ml  Output   1850 ml  Net   1007 ml    PHYSICAL EXAMINATION:  GENERAL:  59  y.o.-year-old patient lying in the bed with no acute distress.  EYES: Pupils equal, round, reactive to light and accommodation. No scleral icterus. Extraocular muscles intact.  HEENT: Head atraumatic, normocephalic. Oropharynx and nasopharynx clear.  NECK:  Supple, no jugular venous distention. No thyroid enlargement, no tenderness.  LUNGS: Normal breath sounds bilaterally, no wheezing, rales,rhonchi. No use of accessory muscles of respiration.  CARDIOVASCULAR: S1, S2 normal. No murmurs, rubs, or gallops.  ABDOMEN: Soft, non-tender, non-distended. Bowel sounds present. No organomegaly or mass.  EXTREMITIES: No pedal edema, cyanosis, or clubbing.  NEUROLOGIC: Cranial nerves II through XII are intact. No focal motor or sensory defecits b/l.  PSYCHIATRIC: The patient is alert and oriented x 3. Good affect.  SKIN: No obvious rash, lesion, or ulcer.   DATA REVIEW:   CBC  Recent Labs Lab 01/09/15 0435  WBC 7.5  HGB 13.8  HCT 40.5  PLT 257    Chemistries   Recent Labs Lab 01/07/15 1532 01/09/15 0435  NA 133* 140  K 3.5 3.7  CL 97* 108  CO2 21* 23  GLUCOSE 74 107*  BUN 14 11  CREATININE 0.82 0.69  CALCIUM 8.4* 8.2*  AST 33  --   ALT 22  --  ALKPHOS 83  --   BILITOT 0.4  --     Cardiac Enzymes No results for input(s): TROPONINI in the last 168 hours.  Microbiology Results  No results found for this or any previous visit.  RADIOLOGY:  No results found.    Management plans discussed with the patient, family and they are in agreement.  CODE STATUS:     Code Status Orders        Start     Ordered   01/08/15 1245  Full code   Continuous     01/08/15 1244      TOTAL TIME TAKING CARE OF THIS PATIENT: 40 minutes.    Henreitta Leber M.D on 01/10/2015 at 1:48 PM  Between 7am to 6pm - Pager - [phone]  After 6pm go to [email or web address] - password [email or web address] [facility]  [place] [facility]  Office  [phone]  CC: Primary care physician; No primary care  provider on file.

## 2015-01-10 NOTE — Progress Notes (Signed)
CONCERNING: IV to Oral Route Change Policy  RECOMMENDATION: This patient is receiving thiamine by the intravenous route.  Based on criteria approved by the Pharmacy and Therapeutics Committee, the intravenous medication(s) is/are being converted to the equivalent oral dose form(s).   DESCRIPTION: These criteria include:  The patient is eating (either orally or via tube) and/or has been taking other orally administered medications for a least 24 hours  The patient has no evidence of active gastrointestinal bleeding or impaired GI absorption (gastrectomy, short bowel, patient on TNA or NPO).  Rexene Edison, PharmD Clinical Pharmacist 01/10/2015 9:20 AM

## 2015-01-10 NOTE — Clinical Social Work Note (Signed)
Patient to discharge today. Patient's nurse inquired about transportation voucher for patient. CSW met with patient this morning and she stated that the people she knows are at work right now. CSW asked if they could come get her after they get off work today and she stated that they could. CSW provided patient with [facility] and [facility] information however patient stated that she is already a part of [facility] and is aware that she will have to go through the crisis center to get a follow up appointment. She states she has followed with [facility] for some time now. No further needs at this time. Shela Leff MSW,LCSW [phone]

## 2015-01-10 NOTE — Discharge Instructions (Signed)
Alcohol Intoxication Alcohol intoxication occurs when the amount of alcohol that a person has consumed impairs his or her ability to mentally and physically function. Alcohol directly impairs the normal chemical activity of the brain. Drinking large amounts of alcohol can lead to changes in mental function and behavior, and it can cause many physical effects that can be harmful.  Alcohol intoxication can range in severity from mild to very severe. Various factors can affect the level of intoxication that occurs, such as the person's age, gender, weight, frequency of alcohol consumption, and the presence of other medical conditions (such as diabetes, seizures, or heart conditions). Dangerous levels of alcohol intoxication may occur when people drink large amounts of alcohol in a short period (binge drinking). Alcohol can also be especially dangerous when combined with certain prescription medicines or "recreational" drugs. SIGNS AND SYMPTOMS Some common signs and symptoms of mild alcohol intoxication include:  Loss of coordination.  Changes in mood and behavior.  Impaired judgment.  Slurred speech. As alcohol intoxication progresses to more severe levels, other signs and symptoms will appear. These may include:  Vomiting.  Confusion and impaired memory.  Slowed breathing.  Seizures.  Loss of consciousness. DIAGNOSIS  Your health care provider will take a medical history and perform a physical exam. You will be asked about the amount and type of alcohol you have consumed. Blood tests will be done to measure the concentration of alcohol in your blood. In many places, your blood alcohol level must be lower than 80 mg/dL (0.08%) to legally drive. However, many dangerous effects of alcohol can occur at much lower levels.  TREATMENT  People with alcohol intoxication often do not require treatment. Most of the effects of alcohol intoxication are temporary, and they go away as the alcohol naturally  leaves the body. Your health care provider will monitor your condition until you are stable enough to go home. Fluids are sometimes given through an IV access tube to help prevent dehydration.  HOME CARE INSTRUCTIONS  Do not drive after drinking alcohol.  Stay hydrated. Drink enough water and fluids to keep your urine clear or pale yellow. Avoid caffeine.   Only take over-the-counter or prescription medicines as directed by your health care provider.  SEEK MEDICAL CARE IF:   You have persistent vomiting.   You do not feel better after a few days.  You have frequent alcohol intoxication. Your health care provider can help determine if you should see a substance use treatment counselor. SEEK IMMEDIATE MEDICAL CARE IF:   You become shaky or tremble when you try to stop drinking.   You shake uncontrollably (seizure).   You throw up (vomit) blood. This may be bright red or may look like black coffee grounds.   You have blood in your stool. This may be bright red or may appear as a black, tarry, bad smelling stool.   You become lightheaded or faint.  MAKE SURE YOU:   Understand these instructions.  Will watch your condition.  Will get help right away if you are not doing well or get worse. Document Released: 03/19/2005 Document Revised: 02/09/2013 Document Reviewed: 11/12/2012 [facility] Patient Information 2015 [place], [place]. This information is not intended to replace advice given to you by your health care provider. Make sure you discuss any questions you have with your health care provider.  Finding Treatment for Alcohol and Drug Addiction It can be hard to find the right place to get professional treatment. Here are some important things to  consider:  There are different types of treatment to choose from.  Some programs are live-in (residential) while others are not (outpatient). Sometimes a combination is offered.  No single type of program is right for  everyone.  Most treatment programs involve a combination of education, counseling, and a 12-step, spiritually-based approach.  There are non-spiritually based programs (not 12-step).  Some treatment programs are government sponsored. They are geared for patients without private insurance.  Treatment programs can vary in many respects such as:  Cost and types of insurance accepted.  Types of on-site medical services offered.  Length of stay, setting, and size.  Overall philosophy of treatment. A person may need specialized treatment or have needs not addressed by all programs. For example, adolescents need treatment appropriate for their age. Other people have secondary disorders that must be managed as well. Secondary conditions can include mental illness, such as depression or diabetes. Often, a period of detoxification from alcohol or drugs is needed. This requires medical supervision and not all programs offer this. THINGS TO CONSIDER WHEN SELECTING A TREATMENT PROGRAM   Is the program certified by the appropriate government agency? Even private programs must be certified and employ certified professionals.  Does the program accept your insurance? If not, can a payment plan be set up?  Is the facility clean, organized, and well run? Do they allow you to speak with graduates who can share their treatment experience with you? Can you tour the facility? Can you meet with staff?  Does the program meet the full range of individual needs?  Does the treatment program address sexual orientation and physical disabilities? Do they provide age, gender, and culturally appropriate treatment services?  Is treatment available in languages other than English?  Is long-term aftercare support or guidance encouraged and provided?  Is assessment of an individual's treatment plan ongoing to ensure it meets changing needs?  Does the program use strategies to encourage reluctant patients to remain  in treatment long enough to increase the likelihood of success?  Does the program offer counseling (individual or group) and other behavioral therapies?  Does the program offer medicine as part of the treatment regimen, if needed?  Is there ongoing monitoring of possible relapse? Is there a defined relapse prevention program? Are services or referrals offered to family members to ensure they understand addiction and the recovery process? This would help them support the recovering individual.  Are 12-step meetings held at the center or is transport available for patients to attend outside meetings? In countries outside of the [place] and [place], [employer] for contact information for services in your area. Document Released: 05/08/2005 Document Revised: 09/01/2011 Document Reviewed: 11/18/2007 [name] Patient Information 2015 [place], [place]. This information is not intended to replace advice given to you by your health care provider. Make sure you discuss any questions you have with your health care provider.

## 2015-01-10 NOTE — Progress Notes (Signed)
Patient A&O, VSS.  No complaints of pain.  Tolerating diet well with no complaints of nausea.  Ativan given x1 for anxiety.  Up to BR independently to void.  Discharge instructions reviewed with patient.  Understanding was verbalized and all questions were answered.  Patient discharged home in stable condition ambulatory, escorted by nursing staff.

## 2015-01-10 NOTE — Care Management Important Message (Signed)
Important Message  Patient Details  Name: [name] MRN: [id number] Date of Birth: 07-07-1955   Medicare Important Message Given:  Yes-second notification given    [name] 01/10/2015, 10:21 AM

## 2015-01-24 ENCOUNTER — Emergency Department
Admission: EM | Admit: 2015-01-24 | Discharge: 2015-01-25 | Disposition: A | Discharge status: Other Acute Inpatient Hospital | Payer: Medicare Other | Attending: Emergency Medicine | Admitting: Emergency Medicine

## 2015-01-24 DIAGNOSIS — F10220 Alcohol dependence with intoxication, uncomplicated: Secondary | ICD-10-CM | POA: Insufficient documentation

## 2015-01-24 DIAGNOSIS — F329 Major depressive disorder, single episode, unspecified: Secondary | ICD-10-CM | POA: Insufficient documentation

## 2015-01-24 DIAGNOSIS — F10939 Alcohol use, unspecified with withdrawal, unspecified: Secondary | ICD-10-CM | POA: Diagnosis present

## 2015-01-24 DIAGNOSIS — R000 Tachycardia, unspecified: Secondary | ICD-10-CM | POA: Insufficient documentation

## 2015-01-24 DIAGNOSIS — Z720 Tobacco use: Secondary | ICD-10-CM | POA: Insufficient documentation

## 2015-01-24 DIAGNOSIS — Z79899 Other long term (current) drug therapy: Secondary | ICD-10-CM | POA: Insufficient documentation

## 2015-01-24 DIAGNOSIS — R45851 Suicidal ideations: Secondary | ICD-10-CM

## 2015-01-24 DIAGNOSIS — S48112A Complete traumatic amputation at level between left shoulder and elbow, initial encounter: Secondary | ICD-10-CM | POA: Diagnosis present

## 2015-01-24 DIAGNOSIS — F10239 Alcohol dependence with withdrawal, unspecified: Secondary | ICD-10-CM | POA: Diagnosis present

## 2015-01-24 LAB — COMPREHENSIVE METABOLIC PANEL
ALK PHOS: 75 U/L (ref 38–126)
ALT: 34 U/L (ref 14–54)
AST: 67 U/L — AB (ref 15–41)
Albumin: 4.9 g/dL (ref 3.5–5.0)
Anion gap: 20 — ABNORMAL HIGH (ref 5–15)
BUN: 8 mg/dL (ref 6–20)
CHLORIDE: 90 mmol/L — AB (ref 101–111)
CO2: 20 mmol/L — ABNORMAL LOW (ref 22–32)
Calcium: 9.1 mg/dL (ref 8.9–10.3)
Creatinine, Ser: 0.85 mg/dL (ref 0.44–1.00)
GFR calc Af Amer: >60 mL/min (ref >60)
GFR calc non Af Amer: >60 mL/min (ref >60)
Glucose, Bld: 74 mg/dL (ref 65–99)
POTASSIUM: 3.9 mmol/L (ref 3.5–5.1)
SODIUM: 130 mmol/L — AB (ref 135–145)
Total Bilirubin: 0.8 mg/dL (ref 0.3–1.2)
Total Protein: 8.4 g/dL — ABNORMAL HIGH (ref 6.5–8.1)

## 2015-01-24 LAB — URINE DRUG SCREEN, QUALITATIVE (ARMC ONLY)
Amphetamines, Ur Screen: POSITIVE — AB
Barbiturates, Ur Screen: NOT DETECTED
Benzodiazepine, Ur Scrn: NOT DETECTED
Cannabinoid 50 Ng, Ur ~~LOC~~: NOT DETECTED
Cocaine Metabolite,Ur ~~LOC~~: NOT DETECTED
MDMA (Ecstasy)Ur Screen: NOT DETECTED
Methadone Scn, Ur: NOT DETECTED
Opiate, Ur Screen: NOT DETECTED
Phencyclidine (PCP) Ur S: NOT DETECTED
Tricyclic, Ur Screen: NOT DETECTED

## 2015-01-24 LAB — CBC
HCT: 45.9 % (ref 35.0–47.0)
Hemoglobin: 15.5 g/dL (ref 12.0–16.0)
MCH: 30.4 pg (ref 26.0–34.0)
MCHC: 33.7 g/dL (ref 32.0–36.0)
MCV: 90.1 fL (ref 80.0–100.0)
Platelets: 268 K/uL (ref 150–440)
RBC: 5.09 MIL/uL (ref 3.80–5.20)
RDW: 14.5 % (ref 11.5–14.5)
WBC: 12 K/uL — ABNORMAL HIGH (ref 3.6–11.0)

## 2015-01-24 LAB — ETHANOL: Alcohol, Ethyl (B): 171 mg/dL — ABNORMAL HIGH

## 2015-01-24 MED ORDER — LORAZEPAM 1 MG PO TABS
ORAL_TABLET | ORAL | Status: AC
  Administered 2015-01-24: 1 mg via ORAL
  Filled 2015-01-24: qty 1

## 2015-01-24 MED ORDER — THIAMINE HCL 100 MG/ML IJ SOLN
100.0000 mg | Freq: Every day | INTRAMUSCULAR | Status: DC
Start: 2015-01-24 — End: 2015-01-25

## 2015-01-24 MED ORDER — LORAZEPAM 2 MG/ML IJ SOLN: 0.0000 mg | Freq: Two times a day (BID) | INTRAMUSCULAR | Status: DC

## 2015-01-24 MED ORDER — CHLORDIAZEPOXIDE HCL 25 MG PO CAPS
25.0000 mg | ORAL_CAPSULE | Freq: Once | ORAL | Status: AC
  Administered 2015-01-24: 25 mg via ORAL
  Filled 2015-01-24: qty 1

## 2015-01-24 MED ORDER — LORAZEPAM 2 MG PO TABS
0.0000 mg | ORAL_TABLET | Freq: Four times a day (QID) | ORAL | Status: DC
  Administered 2015-01-24: 1 mg via ORAL

## 2015-01-24 MED ORDER — LORAZEPAM 2 MG/ML IJ SOLN: 0.0000 mg | Freq: Four times a day (QID) | INTRAMUSCULAR | Status: DC

## 2015-01-24 MED ORDER — LORAZEPAM 2 MG PO TABS
0.0000 mg | ORAL_TABLET | Freq: Two times a day (BID) | ORAL | Status: DC
  Administered 2015-01-25: 2 mg via ORAL
  Filled 2015-01-24: qty 1

## 2015-01-24 MED ORDER — VITAMIN B-1 100 MG PO TABS
100.0000 mg | ORAL_TABLET | Freq: Every day | ORAL | Status: DC
  Administered 2015-01-24 – 2015-01-25 (×2): 100 mg via ORAL
  Filled 2015-01-24 (×2): qty 1

## 2015-01-24 MED ORDER — ONDANSETRON 4 MG PO TBDP
ORAL_TABLET | ORAL | Status: AC
  Administered 2015-01-24: 4 mg via ORAL
  Filled 2015-01-24: qty 1

## 2015-01-24 MED ORDER — ONDANSETRON 4 MG PO TBDP
4.0000 mg | ORAL_TABLET | Freq: Once | ORAL | Status: AC
  Administered 2015-01-24: 4 mg via ORAL

## 2015-01-24 NOTE — ED Notes (Signed)
BEHAVIORAL HEALTH ROUNDING Patient sleeping: No. Patient alert and oriented: yes Behavior appropriate: Yes.  ; If no, describe:  Nutrition and fluids offered: Yes - pt declined offer for sandwich tray at this time Toileting and hygiene offered: Yes  Sitter present: yes Law enforcement present: Yes ODS

## 2015-01-24 NOTE — ED Notes (Signed)

## 2015-01-24 NOTE — ED Provider Notes (Signed)
[facility] Emergency Department Provider Note  ____________________________________________  Time seen: 5:45 PM  I have reviewed the triage vital signs and the nursing notes.   HISTORY  Chief Complaint Suicidal    HPI [name] is a 59 y.o. female who complains of depression and feeling suicidal. No active plans. No homicidal ideations. She also notes that she's been feeling very depressed recently and drinks a case of beer a day. Last drink was at 2:00 PM today. She has a history of withdrawal shakes-seizures but no hallucinations. She does feel anxious and shaky right now.     Past Medical History  Diagnosis Date  . Depression   . Anxiety   . Neuropathy   . Insomnia     Patient Active Problem List   Diagnosis Date Noted  . Alcohol withdrawal 01/08/2015  . Amputation stump pain 01/08/2015  . 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 GAD (generalized anxiety disorder) 12/24/2013  . Severe recurrent major depression without psychotic features 12/22/2013  . PTSD (post-traumatic stress disorder) 12/22/2013  . Alcohol dependence 12/21/2013    Past Surgical History  Procedure Laterality Date  . Cesarean section    . Joint replacement Right   . 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 Abdominal hysterectomy    . Right hip surgery      Current Outpatient Rx  Name  Route  Sig  Dispense  Refill  . gabapentin (NEURONTIN) 300 MG capsule   Oral   Take 900 mg by mouth 3 (three) times daily.         . propranolol (INDERAL) 10 MG tablet   Oral   Take 10 mg by mouth 3 (three) times daily.         . traZODone (DESYREL) 150 MG tablet   Oral   Take 300 mg by mouth at bedtime.         Marland Kitchen venlafaxine XR (EFFEXOR-XR) 150 MG 24 hr capsule   Oral   Take 300 mg by mouth daily with breakfast.         . LORazepam (ATIVAN) 1 MG tablet   Oral   Take 1 tablet (1 mg total) by mouth every 8 (eight) hours as needed for  anxiety. Patient not taking: Reported on 01/24/2015   30 tablet   0     Allergies Benadryl; Cephalosporins; and Codeine  Family History  Problem Relation Age of Onset  . COPD Mother   . Alcoholism Father     Social History History  Substance Use Topics  . Smoking status: Current Every Day Smoker -- 0.50 packs/day    Types: Cigarettes  . Smokeless tobacco: Never Used  . Alcohol Use: 14.4 oz/week    24 Cans of beer per week     Comment: 1 case per day    Review of Systems  Constitutional: No fever or chills. No weight changes Eyes:No blurry vision or double vision.  ENT: No sore throat. Cardiovascular: No chest pain. Respiratory: No dyspnea or cough. Gastrointestinal: Negative for abdominal pain, vomiting and diarrhea.  No BRBPR or melena. Positive nausea Genitourinary: Negative for dysuria, urinary retention, bloody urine, or difficulty urinating. Musculoskeletal: Negative for back pain. No joint swelling or pain. Skin: Negative for rash. Neurological: Negative for headaches, focal weakness or numbness. Psychiatric:No anxiety or depression.   Endocrine:No hot/cold intolerance, changes in energy, or sleep difficulty.  10-point ROS otherwise negative.  ____________________________________________   PHYSICAL EXAM:  VITAL SIGNS: ED  Triage Vitals  Enc Vitals Group     BP 01/24/15 1630 139/68 mmHg     Pulse Rate 01/24/15 1630 107     Resp 01/24/15 1630 16     Temp 01/24/15 1630 98.4 F (36.9 C)     Temp Source 01/24/15 1630 Oral     SpO2 01/24/15 1630 95 %     Weight 01/24/15 1630 142 lb (64.411 kg)     Height 01/24/15 1630 5\' 4"  (1.626 m)     Head Cir --      Peak Flow --      Pain Score 01/24/15 1631 0     Pain Loc --      Pain Edu? --      Excl. in [place]? --      Constitutional: Alert and oriented. Well appearing and in no distress. Eyes: No scleral icterus. No conjunctival pallor. PERRL. EOMI ENT   Head: Normocephalic and atraumatic.   Nose:  No congestion/rhinnorhea. No septal hematoma   Mouth/Throat: MMM, no pharyngeal erythema. No peritonsillar mass. No uvula shift.   Neck: No stridor. No SubQ emphysema. No meningismus. Hematological/Lymphatic/Immunilogical: No cervical lymphadenopathy. Cardiovascular: Tachycardia heart rate 105. Normal and symmetric distal pulses are present in all extremities. No murmurs, rubs, or gallops. Respiratory: Normal respiratory effort without tachypnea nor retractions. Breath sounds are clear and equal bilaterally. No wheezes/rales/rhonchi. Gastrointestinal: Soft and nontender. No distention. There is no CVA tenderness.  No rebound, rigidity, or guarding. Genitourinary: deferred Musculoskeletal: Nontender with normal range of motion in all extremities. No joint effusions.  No lower extremity tenderness.  No edema. Neurologic:   Normal speech and language.  CN 2-10 normal. Motor grossly intact. No pronator drift.  Normal gait. No gross focal neurologic deficits are appreciated.  Skin:  Skin is warm, dry and intact. No rash noted.  No petechiae, purpura, or bullae. Psychiatric: Mood and affect are normal. Speech and behavior are normal. Patient exhibits appropriate insight and judgment.  ____________________________________________    LABS (pertinent positives/negatives) (all labs ordered are listed, but only abnormal results are displayed) Labs Reviewed  COMPREHENSIVE METABOLIC PANEL - Abnormal; Notable for the following:    Sodium 130 (*)    Chloride 90 (*)    CO2 20 (*)    Total Protein 8.4 (*)    AST 67 (*)    Anion gap 20 (*)    All other components within normal limits  ETHANOL - Abnormal; Notable for the following:    Alcohol, Ethyl (B) 171 (*)    All other components within normal limits  CBC - Abnormal; Notable for the following:    WBC 12.0 (*)    All other components within normal limits  URINE DRUG SCREEN, QUALITATIVE ([facility] ONLY) - Abnormal; Notable for the following:     Amphetamines, Ur Screen POSITIVE (*)    All other components within normal limits   ____________________________________________   EKG    ____________________________________________    RADIOLOGY    ____________________________________________   PROCEDURES  ____________________________________________   INITIAL IMPRESSION / ASSESSMENT AND PLAN / ED COURSE  Pertinent labs & imaging results that were available during my care of the patient were reviewed by me and considered in my medical decision making (see chart for details).  Patient presents with symptoms of depression and suicidal ideation without active plans, and mild alcohol withdrawal symptoms. We'll give her by mouth Librium, place, see what protocol, and obtain a psychiatric consultation. I do not think the patient is an  imminent risk of danger to herself or others, so we will keep her in the ED under voluntary status. No criteria for commitment at this time.  ____________________________________________   FINAL CLINICAL IMPRESSION(S) / ED DIAGNOSES  Final diagnoses:  None   alcohol dependence Suicidal ideation    Carrie Mew, MD 01/24/15 [phone]

## 2015-01-24 NOTE — ED Notes (Signed)
BEHAVIORAL HEALTH ROUNDING Patient sleeping: No. Patient alert and oriented: yes Behavior appropriate: Yes.  ; If no, describe:  Nutrition and fluids offered: Yes Toileting and hygiene offered: Yes  Sitter present: yes Law enforcement present: Yes ODS  

## 2015-01-24 NOTE — BH Assessment (Signed)
Assessment Note  [name] is an 59 y.o. female reporting to the ED voluntarily stating that she is "so depressed that she tried to drink herself to death".  Pt did not report any other suicidal plans. Pt reports alcohol intoxication and a desire for detox.  Pt reports still feeling depressed due to the recent break up with her boyfriend.  Pt reports she consumed a case of beer before coming to the hospital.  Pt reports she is experiencing withdrawals such as nausea, sweats and tremors.  Pt denies any homicidal ideations.  She also denied any other drug use.  Axis I: Alcohol Abuse Axis II: Deferred Axis III:  Past Medical History  Diagnosis Date  . Depression   . Anxiety   . Neuropathy   . Insomnia    Axis IV: problems with primary support group Axis V: 61-70 mild symptoms  Past Medical History:  Past Medical History  Diagnosis Date  . Depression   . Anxiety   . Neuropathy   . Insomnia     Past Surgical History  Procedure Laterality Date  . Cesarean section    . Joint replacement Right   . 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 Abdominal hysterectomy    . Right hip surgery      Family History:  Family History  Problem Relation Age of Onset  . COPD Mother   . Alcoholism Father     Social History:  reports that she has been smoking Cigarettes.  She has been smoking about 0.50 packs per day. She has never used smokeless tobacco. She reports that she drinks about 14.4 oz of alcohol per week. She reports that she does not use illicit drugs.  Additional Social History:  Alcohol / Drug Use History of alcohol / drug use?: Yes Substance #1 Name of Substance 1: ETOH 1 - Age of First Use: 48 1 - Amount (size/oz): varies 1 - Frequency: daily 1 - Duration: several years 1 - Last Use / Amount: 1 case today  CIWA: CIWA-Ar BP: 130/64 mmHg Pulse Rate: 100 Nausea and Vomiting: intermittent nausea with dry heaves Tactile Disturbances: none Tremor: two Auditory  Disturbances: not present Paroxysmal Sweats: no sweat visible Visual Disturbances: not present Anxiety: mildly anxious Headache, Fullness in Head: very mild (2/10 pain) Agitation: normal activity Orientation and Clouding of Sensorium: oriented and can do serial additions CIWA-Ar Total: 8 COWS:    Allergies:  Allergies  Allergen Reactions  . Benadryl [Diphenhydramine] Hives and Other (See Comments)    Reaction:  Hyperactivity   . Cephalosporins Hives  . Codeine Nausea And Vomiting    Home Medications:  (Not in a hospital admission)  OB/GYN Status:  No LMP recorded. Patient has had a hysterectomy.  General Assessment Data Location of Assessment: [facility] ED TTS Assessment: In system Is this a Tele or Face-to-Face Assessment?: Face-to-Face Is this an Initial Assessment or a Re-assessment for this encounter?: Initial Assessment Marital status: Single Maiden name: N/A Is patient pregnant?: No Pregnancy Status: No Living Arrangements: Alone Can pt return to current living arrangement?: Yes Admission Status: Voluntary Is patient capable of signing voluntary admission?: Yes Referral Source: Self/Family/Friend Insurance type: Medicare  Medical Screening Exam ([place]) Medical Exam completed: Yes  Crisis Care Plan Living Arrangements: Alone Name of Psychiatrist: [place]  Name of Therapist: [facility]  Education Status Is patient currently in school?: No  Risk to self with the past 6 months Suicidal Ideation: Yes-Currently Present Has patient been a risk  to self within the past 6 months prior to admission? : Yes Suicidal Intent: Yes-Currently Present Has patient had any suicidal intent within the past 6 months prior to admission? : Yes Is patient at risk for suicide?: Yes Suicidal Plan?: Yes-Currently Present Has patient had any suicidal plan within the past 6 months prior to admission? : Yes Specify Current Suicidal Plan: Pt reports wanting to drink herself to  death. Access to Means: Yes Specify Access to Suicidal Means: Access to alcohol What has been your use of drugs/alcohol within the last 12 months?: ETOH Previous Attempts/Gestures: Yes How many times?: 4 Other Self Harm Risks: None reported Triggers for Past Attempts: Unpredictable Intentional Self Injurious Behavior: None Family Suicide History: No Recent stressful life event(s): Conflict (Comment) (Conflict with boyfriend) Persecutory voices/beliefs?: No Depression: Yes Depression Symptoms: Insomnia, Tearfulness, Feeling worthless/self pity, Loss of interest in usual pleasures Substance abuse history and/or treatment for substance abuse?: Yes Suicide prevention information given to non-admitted patients: Not applicable  Risk to Others within the past 6 months Homicidal Ideation: No Does patient have any lifetime risk of violence toward others beyond the six months prior to admission? : No Thoughts of Harm to Others: No Current Homicidal Intent: No Current Homicidal Plan: No Access to Homicidal Means: No Identified Victim: None History of harm to others?: No Assessment of Violence: None Noted Violent Behavior Description: Patient is coopaerative Does patient have access to weapons?: No Criminal Charges Pending?: No Does patient have a court date: No Is patient on probation?: No  Psychosis Hallucinations: None noted Delusions: None noted  Mental Status Report Appearance/Hygiene: In scrubs Eye Contact: Good Motor Activity: Freedom of movement Speech: Logical/coherent, Soft Level of Consciousness: Quiet/awake Mood: Depressed, Sad, Worthless, low self-esteem Affect: Depressed, Sad Anxiety Level: None Thought Processes: Coherent Judgement: Impaired Orientation: Person, Place, Time, Situation Obsessive Compulsive Thoughts/Behaviors: None  Cognitive Functioning Concentration: Normal Memory: Recent Impaired IQ: Average Insight: Poor Impulse Control: Poor Appetite:  Poor Weight Loss: 0 Weight Gain: 0 Sleep: Decreased Total Hours of Sleep: 5 Vegetative Symptoms: None  ADLScreening [facility] Assessment Services) Patient's cognitive ability adequate to safely complete daily activities?: Yes Patient able to express need for assistance with ADLs?: Yes Independently performs ADLs?: Yes (appropriate for developmental age)  Prior Inpatient Therapy Prior Inpatient Therapy: Yes Prior Therapy Dates: July 2016 Prior Therapy Facilty/Provider([facility]): [facility] Reason for Treatment: Substance Abuse  Prior Outpatient Therapy Prior Outpatient Therapy: Yes Prior Therapy Dates: current Prior Therapy Facilty/Provider(s): [facility] Reason for Treatment: Med Management & Therapy Does patient have an ACCT team?: No Does patient have Intensive In-House Services?  : No Does patient have [facility] services? : No Does patient have P4CC services?: No  ADL Screening (condition at time of admission) Patient's cognitive ability adequate to safely complete daily activities?: Yes Patient able to express need for assistance with ADLs?: Yes Independently performs ADLs?: Yes (appropriate for developmental age)       Abuse/Neglect Assessment (Assessment to be complete while patient is alone) Physical Abuse: Denies Verbal Abuse: Denies Sexual Abuse: Denies Exploitation of patient/patient's resources: Denies Self-Neglect: Denies Values / Beliefs Cultural Requests During Hospitalization: None Spiritual Requests During Hospitalization: None Consults Spiritual Care Consult Needed: No Social Work Consult Needed: No Regulatory affairs officer (For Healthcare) Does patient have an advance directive?: No Would patient like information on creating an advanced directive?: No - patient declined information    Additional Information 1:1 In Past 12 Months?: No CIRT Risk: No Elopement Risk: No Does patient have medical clearance?: Yes  Disposition:  Disposition Initial Assessment Completed for  this Encounter: Yes Disposition of Patient: Inpatient treatment program (Refer to [facility]) Type of inpatient treatment program: Adult (Substance Abuse)  On Site Evaluation by:   Reviewed with Physician:    Oneita Hurt 01/24/2015 10:19 PM

## 2015-01-24 NOTE — BHH Counselor (Signed)
Spoke to [place] with [facility] [phone]).  [facility] does not accept Medicare and will not be able to accept patient.

## 2015-01-24 NOTE — ED Notes (Signed)
BEHAVIORAL HEALTH ROUNDING Patient sleeping: Yes.   Patient alert and oriented: yes Behavior appropriate: Yes.  ; If no, describe:  Nutrition and fluids offered: Yes  Toileting and hygiene offered: Yes  Sitter present: yes Law enforcement present: Yes ODS  

## 2015-01-24 NOTE — ED Notes (Addendum)
Pt states that she has been talking to someone at [facility] who called her today and told her there was a bed available at [facility] for her ETOH detox and she needed medical clearance first before she can go, advised to come to the ER for the same. When asked about SI, pt says "I wish I could die" "yeah, I been trying to do that (harm herself) because I have been so depressed"  Pt reports she drinks a case a day of beer, for about 1 week, last drink about 2pm today.

## 2015-01-24 NOTE — ED Notes (Signed)
Pt came to ED w/ c/o of detox.  Pt sts that she wants to "drink herself to death".  Pt estimates her last drink was 2 hours ago.  Pt denies HI.  Pt c/o N/V.

## 2015-01-24 NOTE — ED Notes (Signed)
BEHAVIORAL HEALTH ROUNDING Patient sleeping: Yes.   Patient alert and oriented: yes Behavior appropriate: Yes.  ; If no, describe:  Nutrition and fluids offered: Yes  Toileting and hygiene offered: Yes  Sitter present: yes Law enforcement present: Yes ODS  Pt resting, equal and unlabored respirations, arousal to voice

## 2015-01-24 NOTE — ED Notes (Signed)
MD at bedside. 

## 2015-01-25 ENCOUNTER — Encounter: Payer: Self-pay | Admitting: *Deleted

## 2015-01-25 ENCOUNTER — Inpatient Hospital Stay
Admission: EM | Admit: 2015-01-25 | Discharge: 2015-01-30 | DRG: 885 | Disposition: A | Discharge status: Home / Self Care | Payer: Medicare Other | Attending: Psychiatry | Admitting: Psychiatry

## 2015-01-25 DIAGNOSIS — F411 Generalized anxiety disorder: Secondary | ICD-10-CM | POA: Diagnosis present

## 2015-01-25 DIAGNOSIS — F332 Major depressive disorder, recurrent severe without psychotic features: Secondary | ICD-10-CM

## 2015-01-25 DIAGNOSIS — Z811 Family history of alcohol abuse and dependence: Secondary | ICD-10-CM

## 2015-01-25 DIAGNOSIS — Z888 Allergy status to other drugs, medicaments and biological substances status: Secondary | ICD-10-CM

## 2015-01-25 DIAGNOSIS — G8929 Other chronic pain: Secondary | ICD-10-CM | POA: Diagnosis present

## 2015-01-25 DIAGNOSIS — Z79899 Other long term (current) drug therapy: Secondary | ICD-10-CM | POA: Diagnosis not present

## 2015-01-25 DIAGNOSIS — Z825 Family history of asthma and other chronic lower respiratory diseases: Secondary | ICD-10-CM

## 2015-01-25 DIAGNOSIS — Z886 Allergy status to analgesic agent status: Secondary | ICD-10-CM | POA: Diagnosis not present

## 2015-01-25 DIAGNOSIS — K219 Gastro-esophageal reflux disease without esophagitis: Secondary | ICD-10-CM | POA: Diagnosis present

## 2015-01-25 DIAGNOSIS — Z90710 Acquired absence of both cervix and uterus: Secondary | ICD-10-CM

## 2015-01-25 DIAGNOSIS — F17200 Nicotine dependence, unspecified, uncomplicated: Secondary | ICD-10-CM | POA: Diagnosis present

## 2015-01-25 DIAGNOSIS — K5900 Constipation, unspecified: Secondary | ICD-10-CM | POA: Diagnosis present

## 2015-01-25 DIAGNOSIS — Z9119 Patient's noncompliance with other medical treatment and regimen: Secondary | ICD-10-CM | POA: Diagnosis present

## 2015-01-25 DIAGNOSIS — R45851 Suicidal ideations: Secondary | ICD-10-CM | POA: Diagnosis present

## 2015-01-25 DIAGNOSIS — F10239 Alcohol dependence with withdrawal, unspecified: Secondary | ICD-10-CM | POA: Diagnosis present

## 2015-01-25 DIAGNOSIS — G4700 Insomnia, unspecified: Secondary | ICD-10-CM | POA: Diagnosis present

## 2015-01-25 DIAGNOSIS — F17210 Nicotine dependence, cigarettes, uncomplicated: Secondary | ICD-10-CM | POA: Diagnosis present

## 2015-01-25 DIAGNOSIS — Z638 Other specified problems related to primary support group: Secondary | ICD-10-CM

## 2015-01-25 DIAGNOSIS — F10220 Alcohol dependence with intoxication, uncomplicated: Secondary | ICD-10-CM | POA: Diagnosis not present

## 2015-01-25 DIAGNOSIS — Z9660 Presence of unspecified orthopedic joint implant: Secondary | ICD-10-CM | POA: Diagnosis present

## 2015-01-25 DIAGNOSIS — F1020 Alcohol dependence, uncomplicated: Secondary | ICD-10-CM | POA: Diagnosis present

## 2015-01-25 DIAGNOSIS — Z89222 Acquired absence of left upper limb above elbow: Secondary | ICD-10-CM

## 2015-01-25 MED ORDER — CHLORDIAZEPOXIDE HCL 25 MG PO CAPS: 50.0000 mg | ORAL_CAPSULE | ORAL | Status: DC

## 2015-01-25 MED ORDER — ONDANSETRON 8 MG PO TBDP
4.0000 mg | ORAL_TABLET | Freq: Once | ORAL | Status: AC
  Administered 2015-01-25: 4 mg via ORAL

## 2015-01-25 MED ORDER — VENLAFAXINE HCL ER 75 MG PO CP24
300.0000 mg | ORAL_CAPSULE | Freq: Every day | ORAL | Status: DC
  Administered 2015-01-26 – 2015-01-30 (×5): 300 mg via ORAL
  Filled 2015-01-25 (×5): qty 4

## 2015-01-25 MED ORDER — PROPRANOLOL HCL 10 MG PO TABS
10.0000 mg | ORAL_TABLET | Freq: Three times a day (TID) | ORAL | Status: DC
  Administered 2015-01-25 – 2015-01-30 (×15): 10 mg via ORAL
  Filled 2015-01-25 (×16): qty 1

## 2015-01-25 MED ORDER — TRAZODONE HCL 100 MG PO TABS: 300.0000 mg | ORAL_TABLET | Freq: Every day | ORAL | Status: DC

## 2015-01-25 MED ORDER — VITAMIN B-1 100 MG PO TABS
100.0000 mg | ORAL_TABLET | Freq: Every day | ORAL | Status: DC
  Administered 2015-01-26 – 2015-01-30 (×5): 100 mg via ORAL
  Filled 2015-01-25 (×5): qty 1

## 2015-01-25 MED ORDER — ONDANSETRON 4 MG PO TBDP
ORAL_TABLET | ORAL | Status: AC
  Filled 2015-01-25: qty 1

## 2015-01-25 MED ORDER — LORAZEPAM 2 MG/ML IJ SOLN: 0.0000 mg | Freq: Two times a day (BID) | INTRAMUSCULAR | Status: DC

## 2015-01-25 MED ORDER — LORAZEPAM 2 MG PO TABS
0.0000 mg | ORAL_TABLET | Freq: Four times a day (QID) | ORAL | Status: AC
  Administered 2015-01-25 – 2015-01-26 (×4): 2 mg via ORAL
  Filled 2015-01-25 (×4): qty 1

## 2015-01-25 MED ORDER — GABAPENTIN 300 MG PO CAPS: 900.0000 mg | ORAL_CAPSULE | Freq: Three times a day (TID) | ORAL | Status: DC

## 2015-01-25 MED ORDER — TRAZODONE HCL 100 MG PO TABS
300.0000 mg | ORAL_TABLET | Freq: Every day | ORAL | Status: DC
  Administered 2015-01-25 – 2015-01-29 (×5): 300 mg via ORAL
  Filled 2015-01-25 (×6): qty 3

## 2015-01-25 MED ORDER — VENLAFAXINE HCL ER 75 MG PO CP24: 300.0000 mg | ORAL_CAPSULE | Freq: Every day | ORAL | Status: DC

## 2015-01-25 MED ORDER — PROPRANOLOL HCL 10 MG PO TABS: 10.0000 mg | ORAL_TABLET | Freq: Three times a day (TID) | ORAL | Status: DC

## 2015-01-25 MED ORDER — ACETAMINOPHEN 325 MG PO TABS: 650.0000 mg | ORAL_TABLET | Freq: Four times a day (QID) | ORAL | Status: DC | PRN

## 2015-01-25 MED ORDER — ALUM & MAG HYDROXIDE-SIMETH 200-200-20 MG/5ML PO SUSP: 30.0000 mL | ORAL | Status: DC | PRN

## 2015-01-25 MED ORDER — GABAPENTIN 300 MG PO CAPS
900.0000 mg | ORAL_CAPSULE | Freq: Three times a day (TID) | ORAL | Status: DC
  Administered 2015-01-25 – 2015-01-30 (×16): 900 mg via ORAL
  Filled 2015-01-25 (×16): qty 3

## 2015-01-25 MED ORDER — ONDANSETRON HCL 4 MG PO TABS
ORAL_TABLET | ORAL | Status: AC
  Filled 2015-01-25: qty 1

## 2015-01-25 MED ORDER — MAGNESIUM HYDROXIDE 400 MG/5ML PO SUSP
30.0000 mL | Freq: Every day | ORAL | Status: DC | PRN
  Administered 2015-01-30: 30 mL via ORAL
  Filled 2015-01-25: qty 30

## 2015-01-25 NOTE — ED Notes (Signed)
BEHAVIORAL HEALTH ROUNDING Patient sleeping: Yes.   Patient alert and oriented: sleeping Behavior appropriate: Yes.  ; If no, describe: sleeping Nutrition and fluids offered: sleeping Toileting and hygiene offered: sleeping Sitter present: no Law enforcement present: Yes  and ODS 

## 2015-01-25 NOTE — ED Notes (Signed)
ED BHU [place] Is the patient under IVC or is there intent for IVC: Yes.   Is the patient medically cleared: Yes.   Is there vacancy in the ED BHU: Yes.   Is the population mix appropriate for patient: Yes.   Is the patient awaiting placement in inpatient or outpatient setting: No. Has the patient had a psychiatric consult:  Consult pending Survey of unit performed for contraband, proper placement and condition of furniture, tampering with fixtures in bathroom, shower, and each patient room: Yes.  ; Findings:  APPEARANCE/BEHAVIOR Calm and cooperative NEURO ASSESSMENT Orientation: oriented x3  Denies pain Hallucinations: No.None noted (Hallucinations) Speech: Normal Gait: normal RESPIRATORY ASSESSMENT even unlabored respirations  CARDIOVASCULAR ASSESSMENT Pulses equal  regular rate  Skin warm and dry GASTROINTESTINAL ASSESSMENT no GI complaint EXTREMITIES Full ROM PLAN OF CARE Provide calm/safe environment. Vital signs assessed twice daily. ED BHU Assessment once each 12-hour shift. Collaborate with intake RN daily or as condition indicates. Assure the ED provider has rounded once each shift. Provide and encourage hygiene. Provide redirection as needed. Assess for escalating behavior; address immediately and inform ED provider.  Assess family dynamic and appropriateness for visitation as needed: Yes.  ; If necessary, describe findings:  Educate the patient/family about BHU procedures/visitation: Yes.  ; If necessary, describe findings:

## 2015-01-25 NOTE — ED Notes (Signed)
Patient observed lying in bed with eyes closed  Even, unlabored respirations observed   NAD pt appears to be sleeping  I will continue to monitor along with every 15 minute visual observations and ongoing security camera monitoring    

## 2015-01-25 NOTE — ED Notes (Signed)
Breakfast provided.

## 2015-01-25 NOTE — ED Notes (Signed)
BEHAVIORAL HEALTH ROUNDING Patient sleeping: No. Patient alert and oriented: yes Behavior appropriate: Yes.  ; If no, describe:  Nutrition and fluids offered: yes Toileting and hygiene offered: Yes  Sitter present: q15 minute observations and security camera monitoring Law enforcement present: Yes  ODS  

## 2015-01-25 NOTE — ED Notes (Signed)
ENVIRONMENTAL ASSESSMENT Potentially harmful objects out of patient reach: Yes.   Personal belongings secured: Yes.   Patient dressed in hospital provided attire only: Yes.   Plastic bags out of patient reach: Yes.   Patient care equipment (cords, cables, call bells, lines, and drains) shortened, removed, or accounted for: Yes.   Equipment and supplies removed from bottom of stretcher: Yes.   Potentially toxic materials out of patient reach: Yes.   Sharps container removed or out of patient reach: Yes.    BEHAVIORAL HEALTH ROUNDING Patient sleeping: Yes.   Patient alert and oriented: eyes closed  Appears asleep Behavior appropriate: Yes.  ; If no, describe:  Nutrition and fluids offered: Yes  Toileting and hygiene offered: sleeping Sitter present: q 15 minute observations and security camera monitoring Law enforcement present: yes  ODS

## 2015-01-25 NOTE — ED Notes (Signed)
She has ambulated to the BR  Pt verbalizes nausea this am "I am so nauseated  - I can't eat that breakfast."  Pt informed that I will ask MD for med  BR cleaned by me and then pt went in

## 2015-01-25 NOTE — Progress Notes (Signed)
59 year old female admitted with Suicidal Ideation, depression and substance abuse presents with sad, flat affect. Pt states wants to drink enough to die due to the inability to quit drinking. Pt states she drinks a 12-pack to a case of beers daily. Pt wants help to stop drinking. Pt depressed and verbally contracts for safety. Pt admits to having previous suicide attempt by taking drugs. 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 Also has past med history of right hip replacement showing an old healed scar. Skin and contraband search completed and witnessed by [employer]. No contraband found. Oriented pt to room and unit. All questions answered. Pt continues to have passive SI and verbally contracts for safety. Pt remains safe on unit.

## 2015-01-25 NOTE — BHH Group Notes (Signed)
[place] LCSW Group Therapy  01/25/2015 3:55 PM  Type of Therapy:  Group Therapy  Participation Level:  DID not attend  Participation Quality:    Affect:    Cognitive:    Insight:    Engagement in Therapy:    Modes of Intervention:    Summary of Progress/Problems:  Joana Reamer 01/25/2015, 3:55 PM

## 2015-01-25 NOTE — ED Notes (Signed)
Report called to [place]  1/1 bags of belongings transferred to [place] with her

## 2015-01-25 NOTE — Tx Team (Signed)
Initial Interdisciplinary Treatment Plan   PATIENT STRESSORS: Financial difficulties Health problems Substance abuse   PATIENT STRENGTHS: Average or above average intelligence Capable of independent living Communication skills General fund of knowledge Motivation for treatment/growth   PROBLEM LIST: Problem List/Patient Goals Date to be addressed Date deferred Reason deferred Estimated date of resolution  Major depression 01/25/15     Substance Abuse 01/25/15     Suicidal ideation 01/25/15                                          DISCHARGE CRITERIA:  Improved stabilization in mood, thinking, and/or behavior Withdrawal symptoms are absent or subacute and managed without 24-hour nursing intervention  PRELIMINARY DISCHARGE PLAN: Outpatient therapy  PATIENT/FAMIILY INVOLVEMENT: This treatment plan has been presented to and reviewed with the patient, [name], and/or family member,.  The patient and family have been given the opportunity to ask questions and make suggestions.  Floyde Parkins 01/25/2015, 3:26 PM

## 2015-01-25 NOTE — ED Notes (Signed)
Meal given to pt.

## 2015-01-25 NOTE — ED Notes (Signed)
Breakfast was given to patient. 

## 2015-01-25 NOTE — Consult Note (Signed)
[name]   Reason for Gray:  Gray for this 59 year old woman with a history of alcohol abuse and depression who presents to the hospital with return of heavy drinking and depression Referring Physician:  Marcelene Butte Patient Identification: [name] MRN:  [id number] Principal Diagnosis: Severe recurrent major depression without psychotic features Diagnosis:   Patient Active Problem List   Diagnosis Date Noted  . Alcohol withdrawal [F10.239] 01/08/2015  . Amputation stump pain [T87.89] 01/08/2015  . 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 GAD (generalized anxiety disorder) [F41.1] 12/24/2013  . Severe recurrent major depression without psychotic features [F33.2] 12/22/2013  . PTSD (post-traumatic stress disorder) [F43.10] 12/22/2013  . Alcohol dependence [F10.20] 12/21/2013    Total Time spent with patient: 1 hour  Subjective:   [name] is a 59 y.o. female patient admitted with "I've been drinking a lot".  HPI:  Information from the patient and the chart. Patient says that she's been drinking quite heavily. Last drink was about 24 hours ago. She had been drinking probably as much as a case of beer a day and has been going at it for a couple months now. Mood is been getting worse. She hasn't been able to eat for a few days. Not sleeping well. Having thoughts about killing herself and wishing she were dead. Like she has been compliant with her medication for the most part but is not getting any outpatient therapy or treatment for engaging in any kind of outpatient substance abuse management. Her and binge sounds like it's been going on for at least 2 weeks. Today she is complaining of nausea and tremor all over and feeling very sick.  Past psychiatric history: Alcohol abuse going back years. Has 1 past history of a seizure from alcohol withdrawal. Also has a past history of suicide attempts in the past. Positive for  psychiatric hospitalizations. She is supposed to be following up with [facility] but has not been particularly compliant recently.  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 She is having still some chronic pain intermittently in the stump. She has no other significant ongoing medical problems no history of myocardial infarction.  Social history: Patient lives by herself she claims to have a pretty active indecent social life especially when she is not drinking but even recently has stayed in contact with friends around her.  Family history: Denies any known family history of mental illness or substance abuse problems.  Current medication: Effexor XR, gabapentin, Amaral, trazodone  Substance abuse history: Patient started drinking in mid life but has had quite a few episodes of hospitalizations since then. She has been able to go for years at a time in the past with sobriety she was actively involved in treatment. Doesn't abuse other drugs regularly. HPI Elements:   Quality:  Depression alcohol withdrawal suicidal thoughts. Severity:  Severe life-threatening. Timing:  Worse for the last couple weeks. Duration:  Ongoing active symptoms. Context:  Noncompliance with outpatient treatment.  Past Medical History:  Past Medical History  Diagnosis Date  . Depression   . Anxiety   . Neuropathy   . Insomnia     Past Surgical History  Procedure Laterality Date  . Cesarean section    . Joint replacement Right   . 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 Abdominal hysterectomy    . Right hip surgery     Family History:  Family History  Problem Relation Age of Onset  . COPD Mother   . Alcoholism Father    Social History:  History  Alcohol Use  . 14.4 oz/week  . 24 Cans of beer per week    Comment: 1 case per day     History  Drug Use No    Comment: Patient denies     History   Social History  . Marital Status:  Divorced    Spouse Name: N/A  . Number of Children: N/A  . Years of Education: N/A   Social History Main Topics  . Smoking status: Current Every Day Smoker -- 0.50 packs/day    Types: Cigarettes  . Smokeless tobacco: Never Used  . Alcohol Use: 14.4 oz/week    24 Cans of beer per week     Comment: 1 case per day  . Drug Use: No     Comment: Patient denies   . Sexual Activity: Not on file   Other Topics Concern  . None   Social History Narrative   Additional Social History:    History of alcohol / drug use?: Yes Name of Substance 1: ETOH 1 - Age of First Use: 48 1 - Amount (size/oz): varies 1 - Frequency: daily 1 - Duration: several years 1 - Last Use / Amount: 1 case today                   Allergies:   Allergies  Allergen Reactions  . Benadryl [Diphenhydramine] Hives and Other (See Comments)    Reaction:  Hyperactivity   . Cephalosporins Hives  . Codeine Nausea And Vomiting    Labs:  Results for orders placed or performed during the hospital encounter of 01/24/15 (from the past 48 hour(s))  Urine Drug Screen, Qualitative ([facility] only)     Status: Abnormal   Collection Time: 01/24/15  4:44 PM  Result Value Ref Range   Tricyclic, Ur Screen NONE DETECTED NONE DETECTED   Amphetamines, Ur Screen POSITIVE (A) NONE DETECTED   MDMA (Ecstasy)Ur Screen NONE DETECTED NONE DETECTED   Cocaine Metabolite,Ur [place] NONE DETECTED NONE DETECTED   Opiate, Ur Screen NONE DETECTED NONE DETECTED   Phencyclidine (PCP) Ur S NONE DETECTED NONE DETECTED   Cannabinoid 50 Ng, Ur [place] NONE DETECTED NONE DETECTED   Barbiturates, Ur Screen NONE DETECTED NONE DETECTED   Benzodiazepine, Ur Scrn NONE DETECTED NONE DETECTED   Methadone Scn, Ur NONE DETECTED NONE DETECTED    Comment: (NOTE) 944  Tricyclics, urine               Cutoff 1000 ng/mL 200  Amphetamines, urine             Cutoff 1000 ng/mL 300  MDMA (Ecstasy), urine           Cutoff 500 ng/mL 400  Cocaine Metabolite, urine        Cutoff 300 ng/mL 500  Opiate, urine                   Cutoff 300 ng/mL 600  Phencyclidine (PCP), urine      Cutoff 25 ng/mL 700  Cannabinoid, urine              Cutoff 50 ng/mL 800  Barbiturates, urine             Cutoff 200 ng/mL 900  Benzodiazepine, urine           Cutoff 200 ng/mL 1000 Methadone, urine  Cutoff 300 ng/mL 1100 1200 The urine drug screen provides only a preliminary, unconfirmed 1300 analytical test result and should not be used for non-medical 1400 purposes. Clinical consideration and professional judgment should 1500 be applied to any positive drug screen result due to possible 1600 interfering substances. A more specific alternate chemical method 1700 must be used in order to obtain a confirmed analytical result.  1800 Gas chromato graphy / mass spectrometry (GC/MS) is the preferred 1900 confirmatory method.   Comprehensive metabolic panel     Status: Abnormal   Collection Time: 01/24/15  4:48 PM  Result Value Ref Range   Sodium 130 (L) 135 - 145 mmol/L   Potassium 3.9 3.5 - 5.1 mmol/L   Chloride 90 (L) 101 - 111 mmol/L   CO2 20 (L) 22 - 32 mmol/L   Glucose, Bld 74 65 - 99 mg/dL   BUN 8 6 - 20 mg/dL   Creatinine, Ser 0.85 0.44 - 1.00 mg/dL   Calcium 9.1 8.9 - 10.3 mg/dL   Total Protein 8.4 (H) 6.5 - 8.1 g/dL   Albumin 4.9 3.5 - 5.0 g/dL   AST 67 (H) 15 - 41 U/L   ALT 34 14 - 54 U/L   Alkaline Phosphatase 75 38 - 126 U/L   Total Bilirubin 0.8 0.3 - 1.2 mg/dL   GFR calc non Af Amer >60 >60 mL/min   GFR calc Af Amer >60 >60 mL/min    Comment: (NOTE) The eGFR has been calculated using the CKD EPI equation. This calculation has not been validated in all clinical situations. eGFR's persistently <60 mL/min signify possible Chronic Kidney Disease.    Anion gap 20 (H) 5 - 15  Ethanol (ETOH)     Status: Abnormal   Collection Time: 01/24/15  4:48 PM  Result Value Ref Range   Alcohol, Ethyl (B) 171 (H) <5 mg/dL    Comment:        LOWEST DETECTABLE  LIMIT FOR SERUM ALCOHOL IS 5 mg/dL FOR MEDICAL PURPOSES ONLY   CBC     Status: Abnormal   Collection Time: 01/24/15  4:48 PM  Result Value Ref Range   WBC 12.0 (H) 3.6 - 11.0 K/uL   RBC 5.09 3.80 - 5.20 MIL/uL   Hemoglobin 15.5 12.0 - 16.0 g/dL   HCT 45.9 35.0 - 47.0 %   MCV 90.1 80.0 - 100.0 fL   MCH 30.4 26.0 - 34.0 pg   MCHC 33.7 32.0 - 36.0 g/dL   RDW 14.5 11.5 - 14.5 %   Platelets 268 150 - 440 K/uL    Vitals: Blood pressure 125/67, pulse 99, temperature 98.4 F (36.9 C), temperature source Oral, resp. rate 20, height $RemoveBe'5\' 4"'qnzSPBLXr$  (1.626 m), weight 64.411 kg (142 lb), SpO2 96 %.  Risk to Self: Suicidal Ideation: Yes-Currently Present Suicidal Intent: Yes-Currently Present Is patient at risk for suicide?: Yes Suicidal Plan?: Yes-Currently Present Specify Current Suicidal Plan: Pt reports wanting to drink herself to death. Access to Means: Yes Specify Access to Suicidal Means: Access to alcohol What has been your use of drugs/alcohol within the last 12 months?: ETOH How many times?: 4 Other Self Harm Risks: None reported Triggers for Past Attempts: Unpredictable Intentional Self Injurious Behavior: None Risk to Others: Homicidal Ideation: No Thoughts of Harm to Others: No Current Homicidal Intent: No Current Homicidal Plan: No Access to Homicidal Means: No Identified Victim: None History of harm to others?: No Assessment of Violence: None Noted Violent Behavior Description: Patient is coopaerative Does  patient have access to weapons?: No Criminal Charges Pending?: No Does patient have a court date: No Prior Inpatient Therapy: Prior Inpatient Therapy: Yes Prior Therapy Dates: July 2016 Prior Therapy Facilty/Provider(s): [facility] Reason for Treatment: Substance Abuse Prior Outpatient Therapy: Prior Outpatient Therapy: Yes Prior Therapy Dates: current Prior Therapy Facilty/Provider(s): [facility] Reason for Treatment: Med Management & Therapy Does patient have an ACCT team?:  No Does patient have Intensive In-House Services?  : No Does patient have [facility] services? : No Does patient have P4CC services?: No  Current Facility-Administered Medications  Medication Dose Route Frequency Provider Last Rate Last Dose  . chlordiazePOXIDE (LIBRIUM) capsule 50 mg  50 mg Oral STAT Gonzella Lex, MD      . LORazepam (ATIVAN) injection 0-4 mg  0-4 mg Intravenous 4 times per day Carrie Mew, MD   0 mg at 01/24/15 1830  . LORazepam (ATIVAN) injection 0-4 mg  0-4 mg Intravenous Q12H Carrie Mew, MD   0 mg at 01/24/15 1830  . LORazepam (ATIVAN) tablet 0-4 mg  0-4 mg Oral 4 times per day Carrie Mew, MD   1 mg at 01/24/15 1842  . LORazepam (ATIVAN) tablet 0-4 mg  0-4 mg Oral Q12H Carrie Mew, MD   0 mg at 01/24/15 1830  . ondansetron (ZOFRAN-ODT) 4 MG disintegrating tablet           . thiamine (B-1) injection 100 mg  100 mg Intravenous Daily Carrie Mew, MD   100 mg at 01/24/15 1830  . thiamine (VITAMIN B-1) tablet 100 mg  100 mg Oral Daily Carrie Mew, MD   100 mg at 01/25/15 0902   Current Outpatient Prescriptions  Medication Sig Dispense Refill  . gabapentin (NEURONTIN) 300 MG capsule Take 900 mg by mouth 3 (three) times daily.    . propranolol (INDERAL) 10 MG tablet Take 10 mg by mouth 3 (three) times daily.    . traZODone (DESYREL) 150 MG tablet Take 300 mg by mouth at bedtime.    Marland Kitchen venlafaxine XR (EFFEXOR-XR) 150 MG 24 hr capsule Take 300 mg by mouth daily with breakfast.    . LORazepam (ATIVAN) 1 MG tablet Take 1 tablet (1 mg total) by mouth every 8 (eight) hours as needed for anxiety. (Patient not taking: Reported on 01/24/2015) 30 tablet 0    Musculoskeletal: Strength & Muscle Tone: increased Gait & Station: ataxic Patient leans: N/A  Psychiatric Specialty Exam: Physical Exam  Constitutional: She appears well-developed and well-nourished. She appears listless. She appears toxic.  HENT:  Head: Normocephalic and atraumatic.  Eyes:  Conjunctivae are normal. Pupils are equal, round, and reactive to light.  Neck: Normal range of motion.  Cardiovascular: Normal heart sounds.   Respiratory: Effort normal.  GI: Soft.  Musculoskeletal: Normal range of motion.       Arms: Neurological: She appears listless. She displays tremor.  Tremor in the upper extremity and throughout her body both resting and intention  Skin: Skin is warm and dry.    ROS  Blood pressure 125/67, pulse 99, temperature 98.4 F (36.9 C), temperature source Oral, resp. rate 20, height $RemoveBe'5\' 4"'WXAfzvLNW$  (1.626 m), weight 64.411 kg (142 lb), SpO2 96 %.Body mass index is 24.36 kg/(m^2).  General Appearance: Disheveled  Eye Contact::  Good  Speech:  Slow  Volume:  Decreased  Mood:  Anxious  Affect:  Congruent  Thought Process:  Goal Directed  Orientation:  Full (Time, Place, and Person)  Thought Content:  Negative  Suicidal Thoughts:  Yes.  with  intent/plan  Homicidal Thoughts:  No  Memory:  Immediate;   Good Recent;   Fair Remote;   Good  Judgement:  Fair  Insight:  Fair  Psychomotor Activity:  Tremor  Concentration:  Fair  Recall:  [employer] of Knowledge:Fair  Language: Good  Akathisia:  No  Handed:  Right  AIMS (if indicated):     Assets:  Communication Skills Desire for Improvement Housing Resilience Social Support  ADL's:  Intact  Cognition: WNL  Sleep:      Medical Decision Making: Review of Psycho-Social Stressors (1), Review or order clinical lab tests (1), Established Problem, Worsening (2), Review or order medicine tests (1), Review of Medication Regimen & Side Effects (2) and Review of New Medication or Change in Dosage (2)  Treatment Plan Summary: Daily contact with patient to assess and evaluate symptoms and progress in treatment, Medication management and Plan Patient presenting intoxicated when she first came in with active symptoms of withdrawal. Has a positive history of seizures. Patient is also very depressed and has suicidal  ideation with past history of suicide attempts. She is an appropriate admission to the hospital for stabilization. She will continue on the CIWA protocol. Restart her outpatient medications. She will be detoxed and have her mood stabilized and referred back to outpatient treatment. Patient agreeable to the plan.  Plan:  Recommend psychiatric Inpatient admission when medically cleared. Supportive therapy provided about ongoing stressors. Disposition: Admit to psychiatry  Alethia Berthold 01/25/2015 12:09 PM

## 2015-01-26 ENCOUNTER — Encounter: Payer: Self-pay | Admitting: Psychiatry

## 2015-01-26 DIAGNOSIS — F332 Major depressive disorder, recurrent severe without psychotic features: Principal | ICD-10-CM

## 2015-01-26 DIAGNOSIS — F17200 Nicotine dependence, unspecified, uncomplicated: Secondary | ICD-10-CM | POA: Diagnosis present

## 2015-01-26 DIAGNOSIS — F1020 Alcohol dependence, uncomplicated: Secondary | ICD-10-CM | POA: Diagnosis present

## 2015-01-26 MED ORDER — PROMETHAZINE HCL 25 MG PO TABS
25.0000 mg | ORAL_TABLET | Freq: Four times a day (QID) | ORAL | Status: DC | PRN
  Administered 2015-01-26 – 2015-01-27 (×2): 25 mg via ORAL
  Filled 2015-01-26 (×3): qty 1

## 2015-01-26 MED ORDER — CHLORDIAZEPOXIDE HCL 25 MG PO CAPS
25.0000 mg | ORAL_CAPSULE | Freq: Four times a day (QID) | ORAL | Status: DC
  Administered 2015-01-26 – 2015-01-27 (×4): 25 mg via ORAL
  Filled 2015-01-26 (×5): qty 1

## 2015-01-26 NOTE — Progress Notes (Signed)
D: Pt is asleep in bed this evening. Pt mood is depressed and her affect is sad. Pt is isolating to her room at this time, and is unable to participate in milieu activities due to ETOH detox. Pt CIWA is 12.  A: Writer provided emotional support and administered medications as prescribed, including PRN Ativan for withdrawal symptoms. Writer encouraged fluids and provided Gatorade.  R: Pt is c/o nausea and is not taking food. She denied nausea earlier but now states that she is nauseous. However, she is drinking some fluids. Pt is alert and oriented and ambulates independently.
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
## 2015-01-26 NOTE — Progress Notes (Signed)
Recreation Therapy Notes  INPATIENT RECREATION THERAPY ASSESSMENT  Patient Details Name: [name] MRN: [id number] DOB: Jun 26, 1955 Today's Date: 01/26/2015  Patient Stressors: Family, Friends, Other (Comment) (Son recently moved to [place], lack of friends, finances)  Coping Skills:   Isolate, Arguments, Substance Abuse, Avoidance, Art/Dance, Talking, Music, Sports, Other (Comment) (Deep breathing, meditation, positive thoughts)  Personal Challenges: Concentration, Decision-Making, Problem-Solving, Relationships, Self-Esteem/Confidence, Social Interaction, Stress Management, Substance Abuse  Leisure Interests (2+):  Music - Other (Comment), Individual - Other (Comment) (Sing, dance)  Awareness of Community Resources:  No  Community Resources:     Current Use:    If no, Barriers?:    Patient Strengths:  Good mom, good teacher  Patient Identified Areas of Improvement:  Everything  Current Recreation Participation:  Nothing  Patient Goal for Hospitalization:  To get healthy enough to go to inpatient treatment  [place] of Residence:  [name] of Residence:  [place]   Current SI (including self-harm):  Yes ("Sometimes I do.")  Current HI:  No  Consent to Intern Participation: N/A   Leonette Monarch, LRT/CTRS 01/26/2015, 1:55 PM

## 2015-01-26 NOTE — Progress Notes (Signed)
[name] is in bed resting at this time eyes closed. She has been cooperative, moderate w/d symptoms noted on shift. Fuids was encouraged and at bedside. She was med compliant, she remains sad and depressed. Will continue to monitor behavior and maintain safety precautions.

## 2015-01-26 NOTE — Progress Notes (Signed)
Recreation Therapy Notes  Date: 08.05.16 Time: 3:00 pm Location: Craft Room  Group Topic: Coping Skills  Goal Area(s) Addresses:  Patient will verbalize an emotion they experienced during group. Patient will verbalize benefit of art.  Behavioral Response: Did not attend  Intervention: [place]  Activity: Patients were given mandalas to color.  Education: LRT educated patients on the benefits of art.  Education Outcome: Patient did not attend group.  Clinical Observations/Feedback: Patient did not attend group.  Leonette Monarch, LRT/CTRS 01/26/2015 4:30 PM

## 2015-01-26 NOTE — Care Management Utilization Note (Signed)
   Per State Regulation 482.30  This chart was reviewed for necessity with respect to the patient's Admission/ Duration of stay.  Next review date: 01/30/15  Skipper Cliche RN, BSN

## 2015-01-26 NOTE — Plan of Care (Signed)
Problem: Alteration in mood & ability to function due to Goal: STG: Patient verbalizes decreases in signs of withdrawal Outcome: Progressing She still reports detox symptoms and has sign and symptoms but she is cooperative and tolerating detox well at this point.

## 2015-01-26 NOTE — [facility] Group Notes (Signed)
[facility] Group Notes:  (Nursing/MHT/Case Management/Adjunct)  Date:  01/26/2015  Time:  4:35 PM  Type of Therapy:  Group Therapy  Participation Level:  Did Not Attend  Summary of Progress/Problems:  Roshanda Balazs De'Chelle Prayan Ulin 01/26/2015, 4:35 PM

## 2015-01-26 NOTE — Progress Notes (Addendum)
INITIAL NUTRITION ASSESSMENT   INTERVENTION:  Meals and Snacks: encourage menu completion to best meet pt preferences Nutrition supplement therapy: If unable to meet nutritional needs will add supplement on follow-up  NUTRITION DIAGNOSIS:  No nutrition diagnosis at this time   GOAL:  Patient will meet greater than or equal to 90% of their needs  MONITOR:  Energy Intake, Anthropometrics   REASON FOR ASSESSMENT:  MST  ASSESSMENT:  [name] is a 59 y.o. female with Major depressive disorder, recurrent, severe without psychotic features  Past Medical History  Diagnosis Date  . Depression   . Anxiety   . Neuropathy   . Insomnia     Diet Order: regular  Current Nutrition: eating mostly 80-100% of meals    Medications: reviewed  Protein Profile:  Protein Profile:  Recent Labs Lab 01/24/15 1648  ALBUMIN 4.9     Glucose and Electrolyte/Renal profile:   Recent Labs Lab 01/24/15 1648  NA 130*  K 3.9  CL 90*  CO2 20*  BUN 8  CREATININE 0.85  CALCIUM 9.1  GLUCOSE 74    Digestive System: BM on 8/05 noted  Anthropometrics: noted 11% weight loss in last month per wt encounters  Body mass index is 22.65 kg/(m^2).  Filed Weights   01/25/15 1430  Weight: 132 lb (59.875 kg)      LOW Care Level   Dawnita Molner B. Zenia Resides, [place], [place] (pager)

## 2015-01-26 NOTE — [facility] Group Notes (Signed)
[facility] Group Notes:  (Nursing/MHT/Case Management/Adjunct)  Date:  01/26/2015  Time:  1:50 AM  Type of Therapy:  Group Therapy  Participation Level:  Did Not Attend   Joshuan Bolander Joy Maziyah Vessel 01/26/2015, 1:50 AM

## 2015-01-26 NOTE — H&P (Signed)
Psychiatric Admission Assessment Adult  Patient Identification: [name] MRN:  [id number] Date of Evaluation:  01/26/2015 Chief Complaint:  Major Depression Principal Diagnosis: Major depressive disorder, recurrent, severe without psychotic features Diagnosis:   Patient Active Problem List   Diagnosis Date Noted  . Alcohol use disorder, severe, dependence [F10.20] 01/26/2015  . Tobacco use disorder [Z72.0] 01/26/2015  . Alcohol withdrawal [F10.239] 01/08/2015  . Amputation stump pain [T87.89] 01/08/2015  . 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 GAD (generalized anxiety disorder) [F41.1] 12/24/2013  . Major depressive disorder, recurrent, severe without psychotic features [F33.2] 12/22/2013  . PTSD (post-traumatic stress disorder) [F43.10] 12/22/2013  . Alcohol dependence [F10.20] 12/21/2013   History of Present Illness::   Identifying data. [name] is a 59 year old female with a history of depression, anxiety, and alcohol dependence.  Chief complaint. "I've been drinking a lot."  History of present illness. [name] has a long history of depression, anxiety, and alcoholism. She has been hospitalized at [facility] multiple times for both depression and alcohol detox. She started drinking again 2 weeks ago. She is unable to tell me how much enough to get drunk. But enough to get drunk. She stopped taking her medications. She became increasingly depressed with poor sleep and decreased appetite, anhedonia, feeling of guilt and hopelessness worthlessness, poor energy and concentration social isolation, crying spells and then suicide ideas with a plan to overdose or drink herself to death. She also reports heightened anxiety. She denies psychotic symptoms. She denies some symptoms suggestive of bipolar mania. She denies other than alcohol substance use.   Past psychiatric history. There are multiple psychiatric hospitalizations  for alcohol and depression. She has a history of alcohol withdrawal seizures. She has a history of suicide in the remote past. As she believes that Effexor when she takes it has been useful. She's been tried on other medicines in the past with little improvement. She should be following up with [facility] outpatient substance abuse program but apparently she dropped out.   Family psychiatric history multiple family members with depression and anxiety.   Social history. She lives independently she is estranged from her family because of drinking  Total Time spent with patient: 1 hour  Past Medical History:  Past Medical History  Diagnosis Date  . Depression   . Anxiety   . Neuropathy   . Insomnia     Past Surgical History  Procedure Laterality Date  . Cesarean section    . Joint replacement Right   . 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 Abdominal hysterectomy    . Right hip surgery     Family History:  Family History  Problem Relation Age of Onset  . COPD Mother   . Alcoholism Father    Social History:  History  Alcohol Use  . 14.4 oz/week  . 24 Cans of beer per week    Comment: 1 case per day     History  Drug Use No    Comment: Patient denies     History   Social History  . Marital Status: Divorced    Spouse Name: N/A  . Number of Children: N/A  . Years of Education: N/A   Social History Main Topics  . Smoking status: Current Every Day Smoker -- 0.50 packs/day    Types: Cigarettes  . Smokeless tobacco: Never Used  . Alcohol Use: 14.4 oz/week    24 Cans of beer per week  Comment: 1 case per day  . Drug Use: No     Comment: Patient denies   . Sexual Activity: Yes   Other Topics Concern  . None   Social History Narrative   Additional Social History:                          Musculoskeletal: Strength & Muscle Tone: within normal limits Gait & Station: normal Patient leans: N/A  Psychiatric Specialty Exam: Physical Exam  Nursing note and  vitals reviewed.   Review of Systems  Gastrointestinal: Positive for nausea.  All other systems reviewed and are negative.   Blood pressure 108/73, pulse 134, temperature 98.5 F (36.9 C), temperature source Oral, resp. rate 18, height $RemoveBe'5\' 4"'xqKovIAma$  (1.626 m), weight 59.875 kg (132 lb), SpO2 96 %.Body mass index is 22.65 kg/(m^2).  See SRA.                                                  Sleep:  Number of Hours: 8.5   Risk to Self: Is patient at risk for suicide?: Yes Risk to Others:   Prior Inpatient Therapy:   Prior Outpatient Therapy:    Alcohol Screening: 1. How often do you have a drink containing alcohol?: 4 or more times a week 2. How many drinks containing alcohol do you have on a typical day when you are drinking?: 10 or more 3. How often do you have six or more drinks on one occasion?: Daily or almost daily Preliminary Score: 8 4. How often during the last year have you found that you were not able to stop drinking once you had started?: Daily or almost daily 5. How often during the last year have you failed to do what was normally expected from you becasue of drinking?: Weekly 6. How often during the last year have you needed a first drink in the morning to get yourself going after a heavy drinking session?: Daily or almost daily 7. How often during the last year have you had a feeling of guilt of remorse after drinking?: Monthly 8. How often during the last year have you been unable to remember what happened the night before because you had been drinking?: Weekly 9. Have you or someone else been injured as a result of your drinking?: No 10. Has a relative or friend or a doctor or another health worker been concerned about your drinking or suggested you cut down?: Yes, during the last year Alcohol Use Disorder Identification Test Final Score (AUDIT): 32 Brief Intervention: Yes  Allergies:   Allergies  Allergen Reactions  . Benadryl [Diphenhydramine] Hives  and Other (See Comments)    Reaction:  Hyperactivity   . Cephalosporins Hives  . Codeine Nausea And Vomiting   Lab Results:  Results for orders placed or performed during the hospital encounter of 01/24/15 (from the past 48 hour(s))  Urine Drug Screen, Qualitative ([facility] only)     Status: Abnormal   Collection Time: 01/24/15  4:44 PM  Result Value Ref Range   Tricyclic, Ur Screen NONE DETECTED NONE DETECTED   Amphetamines, Ur Screen POSITIVE (A) NONE DETECTED   MDMA (Ecstasy)Ur Screen NONE DETECTED NONE DETECTED   Cocaine Metabolite,Ur [place] NONE DETECTED NONE DETECTED   Opiate, Ur Screen NONE DETECTED NONE DETECTED   Phencyclidine (PCP) Ur  S NONE DETECTED NONE DETECTED   Cannabinoid 50 Ng, Ur [place] NONE DETECTED NONE DETECTED   Barbiturates, Ur Screen NONE DETECTED NONE DETECTED   Benzodiazepine, Ur Scrn NONE DETECTED NONE DETECTED   Methadone Scn, Ur NONE DETECTED NONE DETECTED    Comment: (NOTE) 983  Tricyclics, urine               Cutoff 1000 ng/mL 200  Amphetamines, urine             Cutoff 1000 ng/mL 300  MDMA (Ecstasy), urine           Cutoff 500 ng/mL 400  Cocaine Metabolite, urine       Cutoff 300 ng/mL 500  Opiate, urine                   Cutoff 300 ng/mL 600  Phencyclidine (PCP), urine      Cutoff 25 ng/mL 700  Cannabinoid, urine              Cutoff 50 ng/mL 800  Barbiturates, urine             Cutoff 200 ng/mL 900  Benzodiazepine, urine           Cutoff 200 ng/mL 1000 Methadone, urine                Cutoff 300 ng/mL 1100 1200 The urine drug screen provides only a preliminary, unconfirmed 1300 analytical test result and should not be used for non-medical 1400 purposes. Clinical consideration and professional judgment should 1500 be applied to any positive drug screen result due to possible 1600 interfering substances. A more specific alternate chemical method 1700 must be used in order to obtain a confirmed analytical result.  1800 Gas chromato graphy / mass spectrometry  (GC/MS) is the preferred 1900 confirmatory method.   Comprehensive metabolic panel     Status: Abnormal   Collection Time: 01/24/15  4:48 PM  Result Value Ref Range   Sodium 130 (L) 135 - 145 mmol/L   Potassium 3.9 3.5 - 5.1 mmol/L   Chloride 90 (L) 101 - 111 mmol/L   CO2 20 (L) 22 - 32 mmol/L   Glucose, Bld 74 65 - 99 mg/dL   BUN 8 6 - 20 mg/dL   Creatinine, Ser 0.85 0.44 - 1.00 mg/dL   Calcium 9.1 8.9 - 10.3 mg/dL   Total Protein 8.4 (H) 6.5 - 8.1 g/dL   Albumin 4.9 3.5 - 5.0 g/dL   AST 67 (H) 15 - 41 U/L   ALT 34 14 - 54 U/L   Alkaline Phosphatase 75 38 - 126 U/L   Total Bilirubin 0.8 0.3 - 1.2 mg/dL   GFR calc non Af Amer >60 >60 mL/min   GFR calc Af Amer >60 >60 mL/min    Comment: (NOTE) The eGFR has been calculated using the CKD EPI equation. This calculation has not been validated in all clinical situations. eGFR's persistently <60 mL/min signify possible Chronic Kidney Disease.    Anion gap 20 (H) 5 - 15  Ethanol (ETOH)     Status: Abnormal   Collection Time: 01/24/15  4:48 PM  Result Value Ref Range   Alcohol, Ethyl (B) 171 (H) <5 mg/dL    Comment:        LOWEST DETECTABLE LIMIT FOR SERUM ALCOHOL IS 5 mg/dL FOR MEDICAL PURPOSES ONLY   CBC     Status: Abnormal   Collection Time: 01/24/15  4:48 PM  Result Value Ref Range  WBC 12.0 (H) 3.6 - 11.0 K/uL   RBC 5.09 3.80 - 5.20 MIL/uL   Hemoglobin 15.5 12.0 - 16.0 g/dL   HCT 45.9 35.0 - 47.0 %   MCV 90.1 80.0 - 100.0 fL   MCH 30.4 26.0 - 34.0 pg   MCHC 33.7 32.0 - 36.0 g/dL   RDW 14.5 11.5 - 14.5 %   Platelets 268 150 - 440 K/uL   Current Medications: Current Facility-Administered Medications  Medication Dose Route Frequency Provider Last Rate Last Dose  . acetaminophen (TYLENOL) tablet 650 mg  650 mg Oral Q6H PRN Gonzella Lex, MD      . alum & mag hydroxide-simeth (MAALOX/MYLANTA) 200-200-20 MG/5ML suspension 30 mL  30 mL Oral Q4H PRN Gonzella Lex, MD      . chlordiazePOXIDE (LIBRIUM) capsule 25 mg  25  mg Oral QID Crit [place] B Krishna Heuer, MD      . gabapentin (NEURONTIN) capsule 900 mg  900 mg Oral TID Gonzella Lex, MD   900 mg at 01/26/15 0953  . magnesium hydroxide (MILK OF MAGNESIA) suspension 30 mL  30 mL Oral Daily PRN Gonzella Lex, MD      . promethazine (PHENERGAN) tablet 25 mg  25 mg Oral Q6H PRN Clovis Fredrickson, MD   25 mg at 01/26/15 1227  . propranolol (INDERAL) tablet 10 mg  10 mg Oral TID Gonzella Lex, MD   10 mg at 01/26/15 0954  . thiamine (VITAMIN B-1) tablet 100 mg  100 mg Oral Daily Gonzella Lex, MD   100 mg at 01/26/15 0954  . traZODone (DESYREL) tablet 300 mg  300 mg Oral QHS Gonzella Lex, MD   300 mg at 01/25/15 2126  . venlafaxine XR (EFFEXOR-XR) 24 hr capsule 300 mg  300 mg Oral Q breakfast Gonzella Lex, MD   300 mg at 01/26/15 2297   PTA Medications: Prescriptions prior to admission  Medication Sig Dispense Refill Last Dose  . gabapentin (NEURONTIN) 300 MG capsule Take 900 mg by mouth 3 (three) times daily.   01/23/2015 at Unknown time  . propranolol (INDERAL) 10 MG tablet Take 10 mg by mouth 3 (three) times daily.   01/23/2015 at Unknown time  . traZODone (DESYREL) 150 MG tablet Take 300 mg by mouth at bedtime.   01/23/2015 at Unknown time  . venlafaxine XR (EFFEXOR-XR) 150 MG 24 hr capsule Take 300 mg by mouth daily with breakfast.   01/23/2015 at Unknown time  . LORazepam (ATIVAN) 1 MG tablet Take 1 tablet (1 mg total) by mouth every 8 (eight) hours as needed for anxiety. (Patient not taking: Reported on 01/24/2015) 30 tablet 0     Previous Psychotropic Medications: Yes   Substance Abuse History in the last 12 months:  Yes.      Consequences of Substance Abuse: Negative  Results for orders placed or performed during the hospital encounter of 01/24/15 (from the past 72 hour(s))  Urine Drug Screen, Qualitative ([place] only)     Status: Abnormal   Collection Time: 01/24/15  4:44 PM  Result Value Ref Range   Tricyclic, Ur Screen NONE DETECTED NONE DETECTED    Amphetamines, Ur Screen POSITIVE (A) NONE DETECTED   MDMA (Ecstasy)Ur Screen NONE DETECTED NONE DETECTED   Cocaine Metabolite,Ur [place] NONE DETECTED NONE DETECTED   Opiate, Ur Screen NONE DETECTED NONE DETECTED   Phencyclidine (PCP) Ur S NONE DETECTED NONE DETECTED   Cannabinoid 50 Ng, Ur [place] NONE DETECTED NONE DETECTED  Barbiturates, Ur Screen NONE DETECTED NONE DETECTED   Benzodiazepine, Ur Scrn NONE DETECTED NONE DETECTED   Methadone Scn, Ur NONE DETECTED NONE DETECTED    Comment: (NOTE) 100  Tricyclics, urine               Cutoff 1000 ng/mL 200  Amphetamines, urine             Cutoff 1000 ng/mL 300  MDMA (Ecstasy), urine           Cutoff 500 ng/mL 400  Cocaine Metabolite, urine       Cutoff 300 ng/mL 500  Opiate, urine                   Cutoff 300 ng/mL 600  Phencyclidine (PCP), urine      Cutoff 25 ng/mL 700  Cannabinoid, urine              Cutoff 50 ng/mL 800  Barbiturates, urine             Cutoff 200 ng/mL 900  Benzodiazepine, urine           Cutoff 200 ng/mL 1000 Methadone, urine                Cutoff 300 ng/mL 1100 1200 The urine drug screen provides only a preliminary, unconfirmed 1300 analytical test result and should not be used for non-medical 1400 purposes. Clinical consideration and professional judgment should 1500 be applied to any positive drug screen result due to possible 1600 interfering substances. A more specific alternate chemical method 1700 must be used in order to obtain a confirmed analytical result.  1800 Gas chromato graphy / mass spectrometry (GC/MS) is the preferred 1900 confirmatory method.   Comprehensive metabolic panel     Status: Abnormal   Collection Time: 01/24/15  4:48 PM  Result Value Ref Range   Sodium 130 (L) 135 - 145 mmol/L   Potassium 3.9 3.5 - 5.1 mmol/L   Chloride 90 (L) 101 - 111 mmol/L   CO2 20 (L) 22 - 32 mmol/L   Glucose, Bld 74 65 - 99 mg/dL   BUN 8 6 - 20 mg/dL   Creatinine, Ser 4.39 0.44 - 1.00 mg/dL   Calcium 9.1 8.9 -  62.1 mg/dL   Total Protein 8.4 (H) 6.5 - 8.1 g/dL   Albumin 4.9 3.5 - 5.0 g/dL   AST 67 (H) 15 - 41 U/L   ALT 34 14 - 54 U/L   Alkaline Phosphatase 75 38 - 126 U/L   Total Bilirubin 0.8 0.3 - 1.2 mg/dL   GFR calc non Af Amer >60 >60 mL/min   GFR calc Af Amer >60 >60 mL/min    Comment: (NOTE) The eGFR has been calculated using the CKD EPI equation. This calculation has not been validated in all clinical situations. eGFR's persistently <60 mL/min signify possible Chronic Kidney Disease.    Anion gap 20 (H) 5 - 15  Ethanol (ETOH)     Status: Abnormal   Collection Time: 01/24/15  4:48 PM  Result Value Ref Range   Alcohol, Ethyl (B) 171 (H) <5 mg/dL    Comment:        LOWEST DETECTABLE LIMIT FOR SERUM ALCOHOL IS 5 mg/dL FOR MEDICAL PURPOSES ONLY   CBC     Status: Abnormal   Collection Time: 01/24/15  4:48 PM  Result Value Ref Range   WBC 12.0 (H) 3.6 - 11.0 K/uL   RBC 5.09 3.80 - 5.20 MIL/uL  Hemoglobin 15.5 12.0 - 16.0 g/dL   HCT 45.9 35.0 - 47.0 %   MCV 90.1 80.0 - 100.0 fL   MCH 30.4 26.0 - 34.0 pg   MCHC 33.7 32.0 - 36.0 g/dL   RDW 14.5 11.5 - 14.5 %   Platelets 268 150 - 440 K/uL    Observation Level/Precautions:  15 minute checks  Laboratory:  CBC Chemistry Profile UDS UA  Psychotherapy:    Medications:    Consultations:    Discharge Concerns:    Estimated LOS:  Other:     Psychological Evaluations: No   Treatment Plan Summary: Daily contact with patient to assess and evaluate symptoms and progress in treatment and Medication management  Medical Decision Making:  New problem, with additional work up planned, Review of Psycho-Social Stressors (1), Review or order clinical lab tests (1), Review of Medication Regimen & Side Effects (2) and Review of New Medication or Change in Dosage (2)   [name] is a 59 year old female with a history of depression, anxiety, and alcoholism admitted for suicidal ideation in the context of treatment noncompliance and  relapse on alcohol.  1. Suicidal ideation. The patient is able to contract for safety in the hospital.  2. Mood. The patient was restarted on Effexor XR for depression and trazodone for sleep.  3. Alcohol detox. She is on CIWA protocol. She complains of nausea. Phenergan was added to her regimen. We also was at Librium taper.  4. Substance abuse treatment. The patient is unable to commit or formulate any plan today  5. Anxiety. She will continue propanolol.  6. Chronic pain. She is on Neurontin.  7. Disposition. She will be discharged to home and follow up with [facility].    I certify that inpatient services furnished can reasonably be expected to improve the patient's condition.   Zaryan Yakubov 8/5/20161:42 PM

## 2015-01-26 NOTE — [facility] Group Notes (Signed)
[facility] LCSW Aftercare Discharge Planning Group Note   01/26/2015 11:43 AM  Participation Quality:  Did not attend.    Shaman Muscarella L Romana Deaton MSW, LCSWA    

## 2015-01-26 NOTE — [facility] Suicide Risk Assessment (Signed)
[facility] Admission Suicide Risk Assessment   Nursing information obtained from:  Patient Demographic factors:  Caucasian, Living alone, Unemployed Current Mental Status:  Suicidal ideation indicated by patient, Suicide plan Loss Factors:  Decline in physical health, Financial problems / change in socioeconomic status Historical Factors:  Prior suicide attempts Risk Reduction Factors:  Sense of responsibility to family, Positive social support Total Time spent with patient: 1 hour Principal Problem: Major depressive disorder, recurrent, severe without psychotic features Diagnosis:   Patient Active Problem List   Diagnosis Date Noted  . Alcohol use disorder, severe, dependence [F10.20] 01/26/2015  . Tobacco use disorder [Z72.0] 01/26/2015  . Alcohol withdrawal [F10.239] 01/08/2015  . Amputation stump pain [T87.89] 01/08/2015  . 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 GAD (generalized anxiety disorder) [F41.1] 12/24/2013  . Major depressive disorder, recurrent, severe without psychotic features [F33.2] 12/22/2013  . PTSD (post-traumatic stress disorder) [F43.10] 12/22/2013  . Alcohol dependence [F10.20] 12/21/2013     Continued Clinical Symptoms:  Alcohol Use Disorder Identification Test Final Score (AUDIT): 32 The "Alcohol Use Disorders Identification Test", Guidelines for Use in Primary Care, Second Edition.  World Pharmacologist [facility]). Score between 0-7:  no or low risk or alcohol related problems. Score between 8-15:  moderate risk of alcohol related problems. Score between 16-19:  high risk of alcohol related problems. Score 20 or above:  warrants further diagnostic evaluation for alcohol dependence and treatment.   CLINICAL FACTORS:   Depression:   Comorbid alcohol abuse/dependence Severe Alcohol/Substance Abuse/Dependencies   Musculoskeletal: Strength & Muscle Tone: within normal limits Gait & Station: normal Patient leans:  N/A  Psychiatric Specialty Exam: Physical Exam  Nursing note and vitals reviewed. Constitutional: She is oriented to person, place, and time. She appears well-developed and well-nourished.  HENT:  Head: Normocephalic and atraumatic.  Eyes: Conjunctivae and EOM are normal. Pupils are equal, round, and reactive to light.  Neck: Normal range of motion. Neck supple.  Cardiovascular: Normal rate, regular rhythm and normal heart sounds.   Respiratory: Breath sounds normal.  GI: Soft. Bowel sounds are normal.  Musculoskeletal: Normal range of motion.  Neurological: She is alert and oriented to person, place, and time. She has normal reflexes.  Skin: Skin is warm and dry.    Review of Systems  Gastrointestinal: Positive for nausea.  All other systems reviewed and are negative.   Blood pressure 108/73, pulse 134, temperature 98.5 F (36.9 C), temperature source Oral, resp. rate 18, height 5\' 4"  (1.626 m), weight 59.875 kg (132 lb), SpO2 96 %.Body mass index is 22.65 kg/(m^2).  General Appearance: Disheveled  Eye Sport and exercise psychologist::  Fair  Speech:  Slow  Volume:  Decreased  Mood:  Depressed and Hopeless  Affect:  Flat  Thought Process:  Goal Directed  Orientation:  Full (Time, Place, and Person)  Thought Content:  WDL  Suicidal Thoughts:  Yes.  without intent/plan  Homicidal Thoughts:  No  Memory:  Immediate;   Fair Recent;   Fair Remote;   Fair  Judgement:  Fair  Insight:  Fair  Psychomotor Activity:  Normal  Concentration:  Fair  Recall:  [employer] of Knowledge:Fair  Language: Fair  Akathisia:  No  Handed:  Right  AIMS (if indicated):     Assets:  Communication Skills Desire for Improvement Financial Resources/Insurance Housing  Sleep:  Number of Hours: 8.5  Cognition: WNL  ADL's:  Intact     COGNITIVE FEATURES THAT CONTRIBUTE TO RISK:  None  SUICIDE RISK:   Moderate:  Frequent suicidal ideation with limited intensity, and duration, some specificity in terms of plans, no  associated intent, good self-control, limited dysphoria/symptomatology, some risk factors present, and identifiable protective factors, including available and accessible social support.  PLAN OF CARE: Hospital admission, medication management, alcohol detox, substance abuse counseling, discharge planning  Medical Decision Making:  New problem, with additional work up planned, Review of Psycho-Social Stressors (1), Review or order clinical lab tests (1), Review of Medication Regimen & Side Effects (2) and Review of New Medication or Change in Dosage (2)   [name] is a 59 year old female with a history of depression, anxiety, and alcoholism admitted for suicidal ideation in the context of treatment noncompliance and relapse on alcohol.  1. Suicidal ideation. The patient is able to contract for safety in the hospital.  2. Mood. The patient was restarted on Effexor XR for depression and trazodone for sleep.  3. Alcohol detox. She is on CIWA protocol. She complains of nausea. Phenergan was added to her regimen. We also was at Librium taper.  4. Substance abuse treatment. The patient is unable to commit or formulate any plan today  5. Anxiety. She will continue propanolol.  6. Chronic pain. She is on Neurontin.  7. Disposition. She will be discharged to home and follow up with her local provider.   I certify that inpatient services furnished can reasonably be expected to improve the patient's condition.   [name] 01/26/2015, 1:34 PM

## 2015-01-26 NOTE — Plan of Care (Signed)
Problem: Alteration in mood & ability to function due to Goal: STG-Patient will report withdrawal symptoms Outcome: Progressing Pt is able to express s/s of withdrawal.

## 2015-01-26 NOTE — Progress Notes (Signed)
Progress Note  Patient is alert x4 this afternoon.  She has a flat sad affect. Reports that she has been feeling anxious along with nausea.   Patient was given prn medication and detox meds as ordered.  She denies suicidal or homicidal thoughts.  Patient also denies AH or VH.  Will cont to monitor for safety.

## 2015-01-27 MED ORDER — PROMETHAZINE HCL 25 MG PO TABS
25.0000 mg | ORAL_TABLET | Freq: Three times a day (TID) | ORAL | Status: DC | PRN
  Administered 2015-01-28 – 2015-01-30 (×6): 25 mg via ORAL
  Filled 2015-01-27 (×6): qty 1

## 2015-01-27 MED ORDER — LORAZEPAM 2 MG PO TABS: 2.0000 mg | ORAL_TABLET | Freq: Three times a day (TID) | ORAL | Status: DC | PRN

## 2015-01-27 MED ORDER — CHLORDIAZEPOXIDE HCL 25 MG PO CAPS
50.0000 mg | ORAL_CAPSULE | Freq: Three times a day (TID) | ORAL | Status: DC
  Administered 2015-01-27 – 2015-01-30 (×10): 50 mg via ORAL
  Filled 2015-01-27 (×10): qty 2

## 2015-01-27 MED ORDER — LORAZEPAM 2 MG PO TABS
2.0000 mg | ORAL_TABLET | Freq: Once | ORAL | Status: AC
  Administered 2015-01-27: 2 mg via ORAL
  Filled 2015-01-27: qty 1

## 2015-01-27 NOTE — Progress Notes (Signed)
Patient ID: [name], female   DOB: 1955/09/25, 59 y.o.   MRN: [id number]  CSW faxed [facility] referral. Waiting for approval.   Wray Kearns MSW, [facility]  01/27/2015

## 2015-01-27 NOTE — [facility] Group Notes (Signed)
[place] LCSW Group Therapy  01/27/2015 2:21 PM  Type of Therapy:  Group Therapy  Participation Level:  Active  Participation Quality:  Attentive  Affect:  Anxious  Cognitive:  Alert  Insight:  Improving  Engagement in Therapy:  Improving  Modes of Intervention:  Discussion, Education, Socialization and Support  Summary of Progress/Problems: Todays topic: Grudges  Patients will be encouraged to discuss their thoughts, feelings, and behaviors as to why one holds on to grudges and reasons why people have grudges. Patients will process the impact of grudges on their daily lives and identify thoughts and feelings related to holding grudges. Patients will identify feelings and thoughts related to what life would look like without grudges. [name] discussed the grudge she holds against her ex husband. She expressed feeling angry and hopeless due to carrying this grudge for over 10 years. She states her divorce influences her substance abuse and depression. She states she feels angry with God because her life is going poorly. She stated her life would be filled with love if she could let go of all of her anger and hate. She states she needs to learn to love herself.   [place] MSW, [facility]  01/27/2015, 2:21 PM

## 2015-01-27 NOTE — Progress Notes (Signed)
[facility] MD Progress Note  01/27/2015 12:43 PM [name]  MRN:  [id number] Subjective: Patient c/o severe nause, tremors and anxiety.  Says she has not been able to eat due to nausea. Denies vomiting.  Continues to voice severe depression and SI.  Denies major problems with sleep last night.  Denies SE from medications.   Denies HI or hallucinations.  Principal Problem: Major depressive disorder, recurrent, severe without psychotic features Diagnosis:   Patient Active Problem List   Diagnosis Date Noted  . Alcohol use disorder, severe, dependence [F10.20] 01/26/2015  . Tobacco use disorder [Z72.0] 01/26/2015  . Alcohol withdrawal [F10.239] 01/08/2015  . Amputation stump pain [T87.89] 01/08/2015  . 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 GAD (generalized anxiety disorder) [F41.1] 12/24/2013  . Major depressive disorder, recurrent, severe without psychotic features [F33.2] 12/22/2013  . PTSD (post-traumatic stress disorder) [F43.10] 12/22/2013  . Alcohol dependence [F10.20] 12/21/2013   Total Time spent with patient: 30 minutes   Past Medical History:  Past Medical History  Diagnosis Date  . Depression   . Anxiety   . Neuropathy   . Insomnia     Past Surgical History  Procedure Laterality Date  . Cesarean section    . Joint replacement Right   . 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 Abdominal hysterectomy    . Right hip surgery     Family History:  Family History  Problem Relation Age of Onset  . COPD Mother   . Alcoholism Father    Social History:  History  Alcohol Use  . 14.4 oz/week  . 24 Cans of beer per week    Comment: 1 case per day     History  Drug Use No    Comment: Patient denies     History   Social History  . Marital Status: Divorced    Spouse Name: N/A  . Number of Children: N/A  . Years of Education: N/A   Social History Main Topics  . Smoking status: Current Every Day Smoker -- 0.50 packs/day    Types:  Cigarettes  . Smokeless tobacco: Never Used  . Alcohol Use: 14.4 oz/week    24 Cans of beer per week     Comment: 1 case per day  . Drug Use: No     Comment: Patient denies   . Sexual Activity: Yes   Other Topics Concern  . None   Social History Narrative   Additional History:    Sleep: Good  Appetite:  Poor   Assessment:   Musculoskeletal: Strength & Muscle Tone: within normal limits Gait & Station: normal Patient leans: N/A   Psychiatric Specialty Exam: Physical Exam  Review of Systems  Constitutional: Negative.   HENT: Negative.   Eyes: Negative.   Respiratory: Negative.   Cardiovascular: Negative.   Gastrointestinal: Positive for nausea.  Genitourinary: Negative.   Musculoskeletal: Negative.   Skin: Negative.   Neurological: Negative.   Endo/Heme/Allergies: Negative.   Psychiatric/Behavioral: Positive for depression and suicidal ideas.    Blood pressure 125/85, pulse 109, temperature 98.6 F (37 C), temperature source Oral, resp. rate 18, height 5\' 4"  (1.626 m), weight 59.875 kg (132 lb), SpO2 96 %.Body mass index is 22.65 kg/(m^2).  General Appearance: Disheveled  Eye Contact::  Good  Speech:  Normal Rate  Volume:  Normal  Mood:  Dysphoric  Affect:  Congruent  Thought Process:  Linear  Orientation:  Full (Time, Place, and Person)  Thought Content:  Hallucinations: None  Suicidal Thoughts:  Yes.  without intent/plan  Homicidal Thoughts:  No  Memory:  Immediate;   Good Recent;   Good Remote;   Good  Judgement:  Fair  Insight:  Fair  Psychomotor Activity:  Decreased  Concentration:  Fair  Recall:  NA  Fund of Knowledge:Good  Language: Good  Akathisia:  No  Handed:    AIMS (if indicated):     Assets:  Agricultural consultant Housing  ADL's:  Intact  Cognition: WNL  Sleep:  Number of Hours: 7.5     Current Medications: Current Facility-Administered Medications  Medication Dose Route Frequency Provider Last  Rate Last Dose  . acetaminophen (TYLENOL) tablet 650 mg  650 mg Oral Q6H PRN Gonzella Lex, MD      . alum & mag hydroxide-simeth (MAALOX/MYLANTA) 200-200-20 MG/5ML suspension 30 mL  30 mL Oral Q4H PRN Gonzella Lex, MD      . chlordiazePOXIDE (LIBRIUM) capsule 50 mg  50 mg Oral TID Hildred Priest, MD      . gabapentin (NEURONTIN) capsule 900 mg  900 mg Oral TID Gonzella Lex, MD   900 mg at 01/27/15 0917  . LORazepam (ATIVAN) tablet 2 mg  2 mg Oral TID PRN Hildred Priest, MD      . LORazepam (ATIVAN) tablet 2 mg  2 mg Oral Once Hildred Priest, MD      . magnesium hydroxide (MILK OF MAGNESIA) suspension 30 mL  30 mL Oral Daily PRN Gonzella Lex, MD      . promethazine (PHENERGAN) tablet 25 mg  25 mg Oral TID BM PRN Hildred Priest, MD      . propranolol (INDERAL) tablet 10 mg  10 mg Oral TID Gonzella Lex, MD   10 mg at 01/27/15 0916  . thiamine (VITAMIN B-1) tablet 100 mg  100 mg Oral Daily Gonzella Lex, MD   100 mg at 01/27/15 9937  . traZODone (DESYREL) tablet 300 mg  300 mg Oral QHS Gonzella Lex, MD   300 mg at 01/26/15 2103  . venlafaxine XR (EFFEXOR-XR) 24 hr capsule 300 mg  300 mg Oral Q breakfast Gonzella Lex, MD   300 mg at 01/27/15 0840    Lab Results: No results found for this or any previous visit (from the past 48 hour(s)).  Physical Findings: AIMS: Facial and Oral Movements Muscles of Facial Expression: None, normal Lips and Perioral Area: None, normal Jaw: None, normal Tongue: None, normal,Extremity Movements Upper (arms, wrists, hands, fingers): None, normal Lower (legs, knees, ankles, toes): None, normal, Trunk Movements Neck, shoulders, hips: None, normal, Overall Severity Severity of abnormal movements (highest score from questions above): None, normal Incapacitation due to abnormal movements: None, normal Patient's awareness of abnormal movements (rate only patient's report): No Awareness, Dental Status Current  problems with teeth and/or dentures?: No Does patient usually wear dentures?: No  CIWA:  CIWA-Ar Total: 0 COWS:     Treatment Plan Summary: Daily contact with patient to assess and evaluate symptoms and progress in treatment and Medication management [name] has a long history of depression, anxiety, and alcoholism. She has been hospitalized at [facility] multiple times for both depression and alcohol detox. She started drinking again 2 weeks ago. She is unable to tell me how much enough to get drunk. But enough to get drunk. She stopped taking her medications. She became increasingly depressed with poor sleep and decreased appetite, anhedonia, feeling of guilt  and hopelessness worthlessness, poor energy and concentration social isolation, crying spells and then suicide ideas with a plan to overdose or drink herself to death.   [name] is a 59 year old female with a history of depression, anxiety, and alcoholism admitted for suicidal ideation in the context of treatment noncompliance and relapse on alcohol.  1. Suicidal ideation. The patient is able to contract for safety in the hospital.  2. Mood. The patient was restarted on Effexor XR for depression and trazodone for sleep.  3. Alcohol detox. She is on CIWA protocol. She complains of nausea. Phenergan was added to her regimen. We also was at Librium taper.  4. Substance abuse treatment. The patient is unable to commit or formulate any plan today  5. Anxiety. She will continue propanolol.  6. Chronic pain. She is on Neurontin.  7. Disposition. She will be discharged to home and follow up with [facility].   Medical Decision Making:  Established Problem, Stable/Improving (1)     Hildred Priest 01/27/2015, 12:43 PM

## 2015-01-27 NOTE — Progress Notes (Signed)
Pt has been pleasant and cooperative. Pt continues  t endorse having fleeting thoughts of suicide but denies having a plan. Pt noted to be tremulus at times. Pt has attended all unit activities. Pt's mood and affect has been depressed. Pt denies having a plan and is able to contract for safety.

## 2015-01-28 NOTE — Plan of Care (Signed)
Problem: Ineffective individual coping Goal: LTG: Patient will report a decrease in negative feelings Outcome: Progressing Patient notes she is starting to physically feel better.  Goal: STG: Pt will be able to identify effective and ineffective STG: Pt will be able to identify effective and ineffective coping patterns  Outcome: Not Progressing Patient unable to discuss coping skills at this time.  Goal: STG: Patient will remain free from self harm Outcome: Progressing No self harm.

## 2015-01-28 NOTE — Progress Notes (Signed)
Pt has been pleasant and cooperative. Pt continues to endorse having fleeting thoughts of suicide but denies having a plan. Pt noted to be tremulus at times. Pt has attended all unit activities. Pt's mood and affect has been less depressed. Pt is able to contract for safety. Pt has been more involved in unit activities this period. Pt states she feels a little better. Will continue to observe and encourage more verbal interactions.

## 2015-01-28 NOTE — [facility] Group Notes (Signed)
[place] Group Notes:  (Nursing/MHT/Case Management/Adjunct)  Date:  01/28/2015  Time:  9:42 AM  Type of Therapy:  Psychoeducational Skills  Participation Level:  Active  Participation Quality:  Attentive  Affect:  Appropriate  Cognitive:  Appropriate and Oriented  Insight:  Improving  Engagement in Group:  Improving  Modes of Intervention:  Discussion and Education  Summary of Progress/Problems:  [name] 01/28/2015, 9:42 AM

## 2015-01-28 NOTE — [facility] Group Notes (Signed)
[employer] LCSW Group Therapy  01/28/2015 12:47 PM  Type of Therapy:  Group Therapy  Participation Level:  Active  Participation Quality:  Attentive  Affect:  Depressed  Cognitive:  Alert  Insight:  Improving  Engagement in Therapy:  Improving  Modes of Intervention:  Discussion, Education, Socialization and Support  Summary of Progress/Problems:Balance in life: Patients will discuss the concept of balance and how it looks and feels to be unbalanced. Pt will identify areas in their life that is unbalanced and ways to become more balanced.  [name] attended group and stayed the entire time. She identified relationships as a stressor for her. She states she needs to take care of her self more. She identified shag and line dancing as method of self care for her.   [place] MSW, [place]   01/28/2015, 12:47 PM

## 2015-01-28 NOTE — Progress Notes (Signed)
[facility] MD Progress Note  01/28/2015 9:19 AM [name]  MRN:  [id number] Subjective: Patient reports feeling slightly better than yesterday. She continues to have nausea and continues to have tremors. She states she is fighting her symptoms and is trying to get out of bed and go to the day room. The patient was seen this morning interacting with peers in the day room and appropriately. The patient was able to do well this morning. Denies any episodes of vomiting. She describes her mood as very depressed and continues to feel suicidal, her energy is poor, appetite is poor and his sleep is fair. Patient denies hallucinations or homicidal ideation. She denies side effects from medications. She denies having any other physical complaints. CIWA : 0 VS: wnl today Oral intake : 100% this morning Sleep: 7 h  Per nursing: Pt has been pleasant and cooperative. Pt continues t endorse having fleeting thoughts of suicide but denies having a plan. Pt noted to be tremulus at times. Pt has attended all unit activities. Pt's mood and affect has been depressed. Pt denies having a plan and is able to contract for safety.  Principal Problem: Major depressive disorder, recurrent, severe without psychotic features Diagnosis:   Patient Active Problem List   Diagnosis Date Noted  . Alcohol use disorder, severe, dependence [F10.20] 01/26/2015  . Tobacco use disorder [Z72.0] 01/26/2015  . Alcohol withdrawal [F10.239] 01/08/2015  . Amputation stump pain [T87.89] 01/08/2015  . 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 GAD (generalized anxiety disorder) [F41.1] 12/24/2013  . Major depressive disorder, recurrent, severe without psychotic features [F33.2] 12/22/2013  . PTSD (post-traumatic stress disorder) [F43.10] 12/22/2013  . Alcohol dependence [F10.20] 12/21/2013   Total Time spent with patient: 30 minutes   Past Medical History:  Past Medical History  Diagnosis Date  . Depression    . Anxiety   . Neuropathy   . Insomnia     Past Surgical History  Procedure Laterality Date  . Cesarean section    . Joint replacement Right   . 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 Abdominal hysterectomy    . Right hip surgery     Family History:  Family History  Problem Relation Age of Onset  . COPD Mother   . Alcoholism Father    Social History:  History  Alcohol Use  . 14.4 oz/week  . 24 Cans of beer per week    Comment: 1 case per day     History  Drug Use No    Comment: Patient denies     History   Social History  . Marital Status: Divorced    Spouse Name: N/A  . Number of Children: N/A  . Years of Education: N/A   Social History Main Topics  . Smoking status: Current Every Day Smoker -- 0.50 packs/day    Types: Cigarettes  . Smokeless tobacco: Never Used  . Alcohol Use: 14.4 oz/week    24 Cans of beer per week     Comment: 1 case per day  . Drug Use: No     Comment: Patient denies   . Sexual Activity: Yes   Other Topics Concern  . None   Social History Narrative   Additional History:    Sleep: Good  Appetite:  Fair   Assessment:   Musculoskeletal: Strength & Muscle Tone: within normal limits Gait & Station: normal Patient leans: N/A   Psychiatric Specialty Exam: Physical Exam   Review of  Systems  Constitutional: Negative.   HENT: Negative.   Eyes: Negative.   Respiratory: Negative.   Cardiovascular: Negative.   Gastrointestinal: Positive for nausea.  Genitourinary: Negative.   Musculoskeletal: Negative.   Skin: Negative.   Neurological: Negative.   Endo/Heme/Allergies: Negative.   Psychiatric/Behavioral: Positive for depression and suicidal ideas.    Blood pressure 113/63, pulse 88, temperature 98.6 F (37 C), temperature source Oral, resp. rate 20, height 5\' 4"  (1.626 m), weight 59.875 kg (132 lb), SpO2 96 %.Body mass index is 22.65 kg/(m^2).  General Appearance: Disheveled  Eye Contact::  Good  Speech:  Normal Rate   Volume:  Normal  Mood:  Dysphoric  Affect:  Congruent  Thought Process:  Linear  Orientation:  Full (Time, Place, and Person)  Thought Content:  Hallucinations: None  Suicidal Thoughts:  Yes.  without intent/plan  Homicidal Thoughts:  No  Memory:  Immediate;   Good Recent;   Good Remote;   Good  Judgement:  Fair  Insight:  Fair  Psychomotor Activity:  Decreased  Concentration:  Fair  Recall:  NA  Fund of Knowledge:Good  Language: Good  Akathisia:  No  Handed:    AIMS (if indicated):     Assets:  Agricultural consultant Housing  ADL's:  Intact  Cognition: WNL  Sleep:  Number of Hours: 7.25     Current Medications: Current Facility-Administered Medications  Medication Dose Route Frequency Provider Last Rate Last Dose  . acetaminophen (TYLENOL) tablet 650 mg  650 mg Oral Q6H PRN Gonzella Lex, MD      . alum & mag hydroxide-simeth (MAALOX/MYLANTA) 200-200-20 MG/5ML suspension 30 mL  30 mL Oral Q4H PRN Gonzella Lex, MD      . chlordiazePOXIDE (LIBRIUM) capsule 50 mg  50 mg Oral TID Hildred Priest, MD   50 mg at 01/28/15 0901  . gabapentin (NEURONTIN) capsule 900 mg  900 mg Oral TID Gonzella Lex, MD   900 mg at 01/28/15 0901  . LORazepam (ATIVAN) tablet 2 mg  2 mg Oral TID PRN Hildred Priest, MD      . magnesium hydroxide (MILK OF MAGNESIA) suspension 30 mL  30 mL Oral Daily PRN Gonzella Lex, MD      . promethazine (PHENERGAN) tablet 25 mg  25 mg Oral TID BM PRN Hildred Priest, MD      . propranolol (INDERAL) tablet 10 mg  10 mg Oral TID Gonzella Lex, MD   10 mg at 01/28/15 0901  . thiamine (VITAMIN B-1) tablet 100 mg  100 mg Oral Daily Gonzella Lex, MD   100 mg at 01/28/15 0901  . traZODone (DESYREL) tablet 300 mg  300 mg Oral QHS Gonzella Lex, MD   300 mg at 01/27/15 2203  . venlafaxine XR (EFFEXOR-XR) 24 hr capsule 300 mg  300 mg Oral Q breakfast Gonzella Lex, MD   300 mg at 01/28/15 7793    Lab  Results: No results found for this or any previous visit (from the past 48 hour(s)).  Physical Findings: AIMS: Facial and Oral Movements Muscles of Facial Expression: None, normal Lips and Perioral Area: None, normal Jaw: None, normal Tongue: None, normal,Extremity Movements Upper (arms, wrists, hands, fingers): None, normal Lower (legs, knees, ankles, toes): None, normal, Trunk Movements Neck, shoulders, hips: None, normal, Overall Severity Severity of abnormal movements (highest score from questions above): None, normal Incapacitation due to abnormal movements: None, normal Patient's awareness of abnormal movements (rate only patient's report):  No Awareness, Dental Status Current problems with teeth and/or dentures?: No Does patient usually wear dentures?: No  CIWA:  CIWA-Ar Total: 0 COWS:     Treatment Plan Summary: Daily contact with patient to assess and evaluate symptoms and progress in treatment and Medication management Mrs. Rallis has a long history of depression, anxiety, and alcoholism. She has been [facility] multiple [facility] for both depression and alcohol detox. She started drinking again 2 weeks ago. She is unable to tell me how much enough to get drunk. But enough to get drunk. She stopped taking her medications. She became increasingly depressed with poor sleep and decreased appetite, anhedonia, feeling of guilt and hopelessness worthlessness, poor energy and concentration social isolation, crying spells and then suicide ideas with a plan to overdose or drink herself to death.   Ms. Guedes is a 59 year old female with a history of depression, anxiety, and alcoholism admitted for suicidal ideation in the context of treatment noncompliance and relapse on alcohol.  1. Suicidal ideation. The patient is able to contract for safety in the hospital.  2. Mood. The patient was restarted on Effexor XR for depression and trazodone for  sleep.  3. Alcohol detox: yesterday was clearly having withdrawals symptoms.  She was anxious, nauseated and tremulous.  Librium was increased to 50 mg tid.  CIWA and VS were changed to tid. Pt received Ativan 2 mg once and it was also ordered tid prn for CIWA >15.  For nausea phenergan tid prn was ordered.  4. Substance abuse treatment. The patient is unable to commit or formulate any plan today  5. Anxiety. She will continue propanolol.  6. Chronic pain. She is on Neurontin.  7. Disposition. She will be discharged to home and follow up with [facility].   Medical Decision Making:  Established Problem, Stable/Improving (1)     Hildred Priest 01/28/2015, 9:19 AM

## 2015-01-28 NOTE — BHH Group Notes (Signed)
[facility] Group Notes:  (Nursing/MHT/Case Management/Adjunct)  Date:  01/28/2015  Time:  3:15 AM  Type of Therapy:  Group Therapy  Participation Level:  Did Not Attend   [name] 01/28/2015, 3:15 AM

## 2015-01-29 MED ORDER — DOCUSATE SODIUM 100 MG PO CAPS
100.0000 mg | ORAL_CAPSULE | Freq: Two times a day (BID) | ORAL | Status: DC
  Administered 2015-01-29 – 2015-01-30 (×3): 100 mg via ORAL
  Filled 2015-01-29 (×3): qty 1

## 2015-01-29 MED ORDER — MAGNESIUM CITRATE PO SOLN
1.0000 | Freq: Once | ORAL | Status: AC
  Administered 2015-01-29: 1 via ORAL
  Filled 2015-01-29: qty 296

## 2015-01-29 MED ORDER — PANTOPRAZOLE SODIUM 40 MG PO TBEC
40.0000 mg | DELAYED_RELEASE_TABLET | Freq: Two times a day (BID) | ORAL | Status: DC
  Administered 2015-01-29 – 2015-01-30 (×3): 40 mg via ORAL
  Filled 2015-01-29 (×3): qty 1

## 2015-01-29 NOTE — Progress Notes (Signed)
D: Patient stated slept good last night .Stated appetite is good and energy level  Is normal. Stated concentration is good . Stated on Depression scale 9 , hopeless 9and anxiety 4  .( low 0-10 high) Denies suicidal  homicidal ideations  .  Marland Kitchen Appropriate ADL'S. Interacting with peers and staff. Remains  On [place] . A: Encourage patient participation with unit programming . Instruction  Given on  Medication , verbalize understanding. R: Voice no other concerns. Staff continue to monitor

## 2015-01-29 NOTE — Progress Notes (Signed)
Was medication compliant and denies AVH, SI, HI. Mood appears depressed. Stated was sleeping at meal time and missed dinner. Meal ordered from dietary. Retreated to room for PM rest. Shift was uneventful.

## 2015-01-29 NOTE — Progress Notes (Signed)
Recreation Therapy Notes  Date: 08.08.16 Time: 3:00 pm Location: Craft Room  Group Topic: Self-expression  Goal Area(s) Addresses:  Patient will be able to identify a color that represents each emotion. Patient will verbalize benefit of using art as a means of self-expression. Patient will verbalize one positive emotion experienced while participating in activity.  Behavioral Response: Attentive, Interactive  Intervention: The Colors Within Me  Activity: Patients were given a blank face worksheet and instructed to think of the emotions they were experiencing, pick a color for that emotion, and show how much of that emotion they are experiencing on the worksheet.   Education: LRT educated patients on different forms of self-expression.  Education Outcome: Acknowledges education/In group clarification offered  Clinical Observations/Feedback: Patient picked a color for each emotion she was experiencing and showed it on her worksheet. Patient contributed to group discussion by stating it was helpful to see her emotions on paper and what steps she could take to change her emotional landscape.  Leonette Monarch, LRT/CTRS 01/29/2015 4:15 PM

## 2015-01-29 NOTE — Progress Notes (Addendum)
[facility] MD Progress Note  01/29/2015 11:03 AM [name]  MRN:  [id number]  Subjective:  [name] still complains of symptoms of withdrawal. She is tremulous. She has constant nausea and could not hold anything down without Phenergan. This is in spite increase in librium dose to 50 mg 3 times a day. Her depression is very bad and she feels suicidal. She is able to contract for safety in the hospital and has no plan. She is hoping to be accepted to [facility] program. She participated and in their program a year ago and maintained sobriety for several months. She was completely unsuccessful trying to participate in [facility].   Principal Problem: Major depressive disorder, recurrent, severe without psychotic features Diagnosis:   Patient Active Problem List   Diagnosis Date Noted  . Alcohol use disorder, severe, dependence [F10.20] 01/26/2015  . Tobacco use disorder [Z72.0] 01/26/2015  . Alcohol withdrawal [F10.239] 01/08/2015  . Amputation stump pain [T87.89] 01/08/2015  . 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 GAD (generalized anxiety disorder) [F41.1] 12/24/2013  . Major depressive disorder, recurrent, severe without psychotic features [F33.2] 12/22/2013  . PTSD (post-traumatic stress disorder) [F43.10] 12/22/2013  . Alcohol dependence [F10.20] 12/21/2013   Total Time spent with patient: 20 minutes   Past Medical History:  Past Medical History  Diagnosis Date  . Depression   . Anxiety   . Neuropathy   . Insomnia     Past Surgical History  Procedure Laterality Date  . Cesarean section    . Joint replacement Right   . 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 Abdominal hysterectomy    . Right hip surgery     Family History:  Family History  Problem Relation Age of Onset  . COPD Mother   . Alcoholism Father    Social History:  History  Alcohol Use  . 14.4 oz/week  . 24 Cans of beer per week    Comment: 1 case per day     History   Drug Use No    Comment: Patient denies     History   Social History  . Marital Status: Divorced    Spouse Name: N/A  . Number of Children: N/A  . Years of Education: N/A   Social History Main Topics  . Smoking status: Current Every Day Smoker -- 0.50 packs/day    Types: Cigarettes  . Smokeless tobacco: Never Used  . Alcohol Use: 14.4 oz/week    24 Cans of beer per week     Comment: 1 case per day  . Drug Use: No     Comment: Patient denies   . Sexual Activity: Yes   Other Topics Concern  . None   Social History Narrative   Additional History:    Sleep: Good  Appetite:  Poor   Assessment:   Musculoskeletal: Strength & Muscle Tone: within normal limits Gait & Station: normal Patient leans: N/A   Psychiatric Specialty Exam: Physical Exam  Nursing note and vitals reviewed.   Review of Systems  Gastrointestinal: Positive for nausea.  Neurological: Positive for tremors.  All other systems reviewed and are negative.   Blood pressure 117/77, pulse 77, temperature 98.6 F (37 C), temperature source Oral, resp. rate 20, height 5\' 4"  (1.626 m), weight 59.875 kg (132 lb), SpO2 96 %.Body mass index is 22.65 kg/(m^2).  General Appearance: Casual  Eye Contact::  Good  Speech:  Clear and Coherent  Volume:  Normal  Mood:  Anxious, Depressed and Hopeless  Affect:  Flat  Thought Process:  Goal Directed  Orientation:  Full (Time, Place, and Person)  Thought Content:  WDL  Suicidal Thoughts:  No  Homicidal Thoughts:  No  Memory:  Immediate;   Fair Recent;   Fair Remote;   Fair  Judgement:  Fair  Insight:  Fair  Psychomotor Activity:  Normal  Concentration:  Fair  Recall:  [employer] [place]  Language: Fair  Akathisia:  No  Handed:  Right  AIMS (if indicated):     Assets:  Communication Skills Desire for Improvement Financial Resources/Insurance Housing Resilience  ADL's:  Intact  Cognition: WNL  Sleep:  Number of Hours: 7     Current  Medications: Current Facility-Administered Medications  Medication Dose Route Frequency Provider Last Rate Last Dose  . acetaminophen (TYLENOL) tablet 650 mg  650 mg Oral Q6H PRN Gonzella Lex, MD      . alum & mag hydroxide-simeth (MAALOX/MYLANTA) 200-200-20 MG/5ML suspension 30 mL  30 mL Oral Q4H PRN Gonzella Lex, MD      . chlordiazePOXIDE (LIBRIUM) capsule 50 mg  50 mg Oral TID Hildred Priest, MD   50 mg at 01/29/15 1002  . docusate sodium (COLACE) capsule 100 mg  100 mg Oral BID Adely Facer B Jacquita Mulhearn, MD      . gabapentin (NEURONTIN) capsule 900 mg  900 mg Oral TID Gonzella Lex, MD   900 mg at 01/29/15 1002  . LORazepam (ATIVAN) tablet 2 mg  2 mg Oral TID PRN Hildred Priest, MD      . magnesium citrate solution 1 Bottle  1 Bottle Oral Once Dastan Krider B Havoc Sanluis, MD      . magnesium hydroxide (MILK OF MAGNESIA) suspension 30 mL  30 mL Oral Daily PRN Gonzella Lex, MD      . pantoprazole (PROTONIX) EC tablet 40 mg  40 mg Oral BID AC Akyla Vavrek B Patty Lopezgarcia, MD      . promethazine (PHENERGAN) tablet 25 mg  25 mg Oral TID BM PRN Hildred Priest, MD   25 mg at 01/29/15 0743  . propranolol (INDERAL) tablet 10 mg  10 mg Oral TID Gonzella Lex, MD   10 mg at 01/29/15 1001  . thiamine (VITAMIN B-1) tablet 100 mg  100 mg Oral Daily Gonzella Lex, MD   100 mg at 01/29/15 1002  . traZODone (DESYREL) tablet 300 mg  300 mg Oral QHS Gonzella Lex, MD   300 mg at 01/28/15 2035  . venlafaxine XR (EFFEXOR-XR) 24 hr capsule 300 mg  300 mg Oral Q breakfast Gonzella Lex, MD   300 mg at 01/29/15 0825    Lab Results: No results found for this or any previous visit (from the past 53 hour(s)).  Physical Findings: AIMS: Facial and Oral Movements Muscles of Facial Expression: None, normal Lips and Perioral Area: None, normal Jaw: None, normal Tongue: None, normal,Extremity Movements Upper (arms, wrists, hands, fingers): None, normal Lower (legs, knees, ankles, toes): None,  normal, Trunk Movements Neck, shoulders, hips: None, normal, Overall Severity Severity of abnormal movements (highest score from questions above): None, normal Incapacitation due to abnormal movements: None, normal Patient's awareness of abnormal movements (rate only patient's report): No Awareness, Dental Status Current problems with teeth and/or dentures?: No Does patient usually wear dentures?: No  CIWA:  CIWA-Ar Total: 1 COWS:     Treatment Plan Summary: Daily contact with patient to assess and evaluate symptoms  and progress in treatment and Medication management   Medical Decision Making:  Established Problem, Stable/Improving (1), Review of Psycho-Social Stressors (1), Review or order clinical lab tests (1), Review of Medication Regimen & Side Effects (2) and Review of New Medication or Change in Dosage (2)   [name] is a 59 year old female with a history of depression, anxiety, and alcoholism admitted for suicidal ideation in the context of treatment noncompliance and relapse on alcohol.  1. Suicidal ideation. The patient is able to contract for safety in the hospital.  2. Mood. The patient was restarted on Effexor XR 300 mg for depression and trazodone 300 mg for sleep.  3. Alcohol detox. She is on Librium taper and ativan per CIWA protocol. She complains of nausea. Phenergan was added to her regimen.   4. Substance abuse treatment. The patient was referred to [place] treatment facility.  5. Anxiety. She will continue propanolol.  6. Chronic pain. She is on Neurontin.  7. Constipation. We started Colace and milk of magnesia.  8. GERD. We started pantoprazole.  9. Disposition. TBE. Follow up with [facility].      [name] 01/29/2015, 11:03 AM

## 2015-01-29 NOTE — BHH Group Notes (Signed)
[facility] Group Notes:  (Nursing/MHT/Case Management/Adjunct)  Date:  01/29/2015  Time:  10:48 PM  Type of Therapy:  Evening Wrap-up Group/Outside Activity  Participation Level:  Did Not Attend  Participation Quality:  N/A  Affect:  N/A  Cognitive:  N/A  Insight:  None  Engagement in Group:  None  Modes of Intervention:  Activity  Summary of Progress/Problems:  Levonne Spiller 01/29/2015, 10:48 PM

## 2015-01-29 NOTE — BHH Group Notes (Signed)
[facility] Group Notes:  (Nursing/MHT/Case Management/Adjunct)  Date:  01/29/2015  Time:  2:50 PM  Type of Therapy:  Psychoeducational Skills  Participation Level:  Active  Participation Quality:  Appropriate  Affect:  Appropriate  Cognitive:  Appropriate  Insight:  Limited  Engagement in Group:  Engaged  Modes of Intervention:  Discussion, Education and Support  Summary of Progress/Problems:  [name] 01/29/2015, 2:50 PM

## 2015-01-29 NOTE — Plan of Care (Signed)
Problem: [facility] Participation in Recreation Therapeutic Interventions Goal: STG-Patient will demonstrate improved self esteem by identif STG: Self-Esteem - Within 5 treatment sessions, patient will verbalize at least 5 positive affirmation statements in each of 3 treatment sessions to increase self-esteem post d/c.  Outcome: Progressing Treatment Session 1; Completed 1 out of 3: At approximately 12:40 pm, LRT met with patient in craft room. Patient verbalized 5 positive affirmation statements. Patient reported it felt "pretty good". LRT encouraged patient to continue saying positive affirmation statements.  Leonette Monarch, LRT/CTRS 08.08.16 1:23 pm Goal: STG-Patient will identify at least five coping skills for ** STG: Coping Skills - Within 4 treatment sessions, patient will verbalize at least 5 coping skills for substance abuse in each of 2 treatment sessions to decrease substance abuse post d/c.  Outcome: Progressing Treatment Session 1; Completed 1 out of 2: At approximately 12:40 pm, LRT met with patient in craft room. Patient verbalized 5 coping skills for substance abuse. LRT educated patient on leisure and why it is important to implement into her schedule. LRT provided patient with blank schedules to help her plan her day and try to avoid using substances. LRT educated patient on healthy support systems.  Leonette Monarch, LRT/CTRS 08.08.16 1:26 pm Goal: STG-Other Recreation Therapy Goal (Specify) STG: Decision Making - Within 4 treatment sessions, patient will verbalize understanding of one new decision making chart in each of 2 treatment sessions to increase decision making skills post d/c.  Outcome: Progressing Treatment Session 1; Completed 1 out of 2: At approximately 12:40 pm, LRT met with patient in craft room. LRT educated patient on one decision making chart. Patient verbalized understanding. LRT encouraged patient to use chart when she is making decisions.  Leonette Monarch, LRT/CTRS 08.08.16 1:27 pm

## 2015-01-29 NOTE — BHH Group Notes (Signed)
[facility] LCSW Aftercare Discharge Planning Group Note  01/29/2015 10:37 AM  Participation Quality:  Attentive  Affect:  Appropriate  Cognitive:  Appropriate  Insight:  Developing/Improving  Engagement in Group:  Developing/Improving  Modes of Intervention:  Discussion, Education, Exploration and Support  Summary of Progress/Problems: patient goal today is to come to group, go to treatment and get better  [name] M 01/29/2015, 10:37 AM

## 2015-01-29 NOTE — BHH Group Notes (Signed)
[facility] LCSW Group Therapy  01/29/2015 2:34 PM  Type of Therapy:  Group Therapy  Participation Level:  Active  Participation Quality:  Appropriate  Affect:  Appropriate  Cognitive:  Appropriate  Insight:  Developing/Improving  Engagement in Therapy:  Engaged  Modes of Intervention:  Discussion, Education, Exploration and Support  Summary of Progress/Problems:LCSW introduced group rules. This patient discussed she would have a great life once she overcomes her addiction,she has identified she needs the [facility] treatment facility to assist with her addiction and mental illness. She is hopeful with support she can get her life back.She was very supportive towards her peers.  [name] M 01/29/2015, 2:34 PM

## 2015-01-30 MED ORDER — VENLAFAXINE HCL ER 150 MG PO CP24: 300.0000 mg | ORAL_CAPSULE | Freq: Every day | ORAL | Status: DC

## 2015-01-30 MED ORDER — TRAZODONE HCL 150 MG PO TABS: 300.0000 mg | ORAL_TABLET | Freq: Every day | ORAL | Status: DC

## 2015-01-30 MED ORDER — PANTOPRAZOLE SODIUM 40 MG PO TBEC
40.0000 mg | DELAYED_RELEASE_TABLET | Freq: Two times a day (BID) | ORAL | Status: DC
Start: 2015-01-30 — End: 2015-01-30

## 2015-01-30 MED ORDER — PANTOPRAZOLE SODIUM 40 MG PO TBEC: 40.0000 mg | DELAYED_RELEASE_TABLET | Freq: Two times a day (BID) | ORAL | Status: DC

## 2015-01-30 NOTE — Plan of Care (Signed)
Problem: [facility] Participation in Recreation Therapeutic Interventions Goal: STG-Patient will demonstrate improved self esteem by identif STG: Self-Esteem - Within 5 treatment sessions, patient will verbalize at least 5 positive affirmation statements in each of 3 treatment sessions to increase self-esteem post d/c.  Outcome: Progressing Treatment Session 2; Completed 2 out of 2: At approximately 9:50 am, LRT met with patient in patient room. Patient verbalized 5 positive affirmation statements. Patient reported it felt "pretty good". LRT encouraged patient to continue saying positive affirmation statements.  Leonette Monarch, LRT/CTRS 08.09.16 1:19 pm Goal: STG-Patient will identify at least five coping skills for ** STG: Coping Skills - Within 4 treatment sessions, patient will verbalize at least 5 coping skills for substance abuse in each of 2 treatment sessions to decrease substance abuse post d/c.  Outcome: Completed/Met Date Met:  01/30/15 Treatment Session 2; Completed 2 out of 2: At approximately 9:50 am, LRT met with patient in patient room. Patient verbalized 5 coping skills for substance abuse. LRT encouraged patient to participate in leisure activities more often.   Leonette Monarch, LRT/CTRS 08.09.16 1:20 pm Goal: STG-Other Recreation Therapy Goal (Specify) STG: Decision Making - Within 4 treatment sessions, patient will verbalize understanding of one new decision making chart in each of 2 treatment sessions to increase decision making skills post d/c.  Outcome: Completed/Met Date Met:  01/30/15 Treatment Session 2; Completed 2 out of 2: At approximately 1:00 pm, LRT met with patient in patient room. LRT educated patient on decision making chart. Patient verbalized understanding. LRT encouraged patient to use decision making charts to help her make decisions.  Leonette Monarch, LRT/CTRS 08.09.16 1:21 pm

## 2015-01-30 NOTE — [facility] Group Notes (Signed)
[facility] Group Notes:  (Nursing/MHT/Case Management/Adjunct)  Date:  01/30/2015  Time:  2:07 PM  Type of Therapy:  Psychoeducational Skills  Participation Level:  Active  Participation Quality:  Appropriate  Affect:  Appropriate  Cognitive:  Appropriate  Insight:  Appropriate  Engagement in Group:  Engaged  Modes of Intervention:  Discussion  Summary of Progress/Problems:  [name] 01/30/2015, 2:07 PM

## 2015-01-30 NOTE — Progress Notes (Signed)
Patient discharged at this time to [name] service. Patient transferred from wheelchair to taxi with writer at side, safety maintained. Patient verifies belongings when they are returned to her. Patient verbalizes understanding of recommended discharge plan of care. No SI/HI. Safety maintained.

## 2015-01-30 NOTE — Progress Notes (Signed)
Patient with depressed affect, slightly anxious. No SI/HI/AVH at this time. Denies S/S of detox, remains on detox protocol with meds as ordered after Blood pressure monitoring. PO fluids encouraged. Quiet and sleepy in bed, minimal interaction with peers. Verbalizes needs appropriately when staff initiates interaction. Fair adls and fair appetite. Safety maintained.

## 2015-01-30 NOTE — Discharge Summary (Signed)
Physician Discharge Summary Note  Patient:  [name] is an 59 y.o., female MRN:  [id number] DOB:  1955-07-31 Patient phone:  [phone] (home)  Patient address:   [place] [address],  Total Time spent with patient: 30 minutes  Date of Admission:  01/25/2015 Date of Discharge: 01/30/2015  Reason for Admission:  Suicidal ideation.  Identifying data. [name] is a 59 year old female with a history of depression, anxiety, and alcohol dependence.  Chief complaint. "I've been drinking a lot."  History of present illness. [name] has a long history of depression, anxiety, and alcoholism. She has been hospitalized at [facility] multiple times for both depression and alcohol detox. She started drinking again 2 weeks ago. She is unable to tell me how much enough to get drunk. But enough to get drunk. She stopped taking her medications. She became increasingly depressed with poor sleep and decreased appetite, anhedonia, feeling of guilt and hopelessness worthlessness, poor energy and concentration social isolation, crying spells and then suicide ideas with a plan to overdose or drink herself to death. She also reports heightened anxiety. She denies psychotic symptoms. She denies some symptoms suggestive of bipolar mania. She denies other than alcohol substance use.   Past psychiatric history. There are multiple psychiatric hospitalizations for alcohol and depression. She has a history of alcohol withdrawal seizures. She has a history of suicide in the remote past. As she believes that Effexor when she takes it has been useful. She's been tried on other medicines in the past with little improvement. She should be following up with [facility] outpatient substance abuse program but apparently she dropped out.   Family psychiatric history multiple family members with depression and anxiety.   Social history. She lives independently she is estranged from her family  because of drinking   Principal Problem: Major depressive disorder, recurrent, severe without psychotic features Discharge Diagnoses: Patient Active Problem List   Diagnosis Date Noted  . Alcohol use disorder, severe, dependence [F10.20] 01/26/2015  . Tobacco use disorder [Z72.0] 01/26/2015  . Alcohol withdrawal [F10.239] 01/08/2015  . Amputation stump pain [T87.89] 01/08/2015  . 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 GAD (generalized anxiety disorder) [F41.1] 12/24/2013  . Major depressive disorder, recurrent, severe without psychotic features [F33.2] 12/22/2013  . PTSD (post-traumatic stress disorder) [F43.10] 12/22/2013  . Alcohol dependence [F10.20] 12/21/2013    Musculoskeletal: Strength & Muscle Tone: within normal limits Gait & Station: normal Patient leans: N/A  Psychiatric Specialty Exam: Physical Exam  Nursing note and vitals reviewed.   Review of Systems  Gastrointestinal: Positive for nausea.  All other systems reviewed and are negative.   Blood pressure 103/70, pulse 85, temperature 98.5 F (36.9 C), temperature source Oral, resp. rate 20, height 5\' 4"  (1.626 m), weight 59.875 kg (132 lb), SpO2 96 %.Body mass index is 22.65 kg/(m^2).  See SRA.                                                  Sleep:  Number of Hours: 7.5   Have you used any form of tobacco in the last 30 days? (Cigarettes, Smokeless Tobacco, Cigars, and/or Pipes): Yes  Has this patient used any form of tobacco in the last 30 days? (Cigarettes, Smokeless Tobacco, Cigars, and/or Pipes) Yes, A prescription for an  FDA-approved tobacco cessation medication was offered at discharge and the patient refused  Past Medical History:  Past Medical History  Diagnosis Date  . Depression   . Anxiety   . Neuropathy   . Insomnia     Past Surgical History  Procedure Laterality Date  . Cesarean section    . Joint replacement Right   . Arm amputation  at shoulder Left   . Abdominal hysterectomy    . Right hip surgery     Family History:  Family History  Problem Relation Age of Onset  . COPD Mother   . Alcoholism Father    Social History:  History  Alcohol Use  . 14.4 oz/week  . 24 Cans of beer per week    Comment: 1 case per day     History  Drug Use No    Comment: Patient denies     History   Social History  . Marital Status: Divorced    Spouse Name: N/A  . Number of Children: N/A  . Years of Education: N/A   Social History Main Topics  . Smoking status: Current Every Day Smoker -- 0.50 packs/day    Types: Cigarettes  . Smokeless tobacco: Never Used  . Alcohol Use: 14.4 oz/week    24 Cans of beer per week     Comment: 1 case per day  . Drug Use: No     Comment: Patient denies   . Sexual Activity: Yes   Other Topics Concern  . None   Social History Narrative    Past Psychiatric History: Hospitalizations:  Outpatient Care:  Substance Abuse Care:  Self-Mutilation:  Suicidal Attempts:  Violent Behaviors:   Risk to Self: Is patient at risk for suicide?: Yes Risk to Others:   Prior Inpatient Therapy:   Prior Outpatient Therapy:    Level of Care:  OP  Hospital Course:    [name] is a 59 year old female with a history of depression, anxiety, and alcoholism admitted for suicidal ideation in the context of treatment noncompliance and relapse on alcohol.  1. Suicidal ideation. This has resolved. The patient is able to contract for safety.   2. Mood. The patient was restarted on Effexor XR 300 mg for depression and trazodone 300 mg for sleep.  3. Alcohol detox. She completed Librium taper. This was uncomplicated detox. Vital signs were stable.   4. Substance abuse treatment. The patient was referred to [place] treatment facility but there are no beds available.  5. Anxiety. She continued propanolol.  6. Chronic pain. She is on Neurontin.  7. Constipation. We started Colace and milk of  magnesia.  8. GERD. We started pantoprazole.  9. Disposition. She is discharged to home. She will follow up with [facility] IOP at [name].   Consults:  None  Significant Diagnostic Studies:  None  Discharge Vitals:   Blood pressure 103/70, pulse 85, temperature 98.5 F (36.9 C), temperature source Oral, resp. rate 20, height 5\' 4"  (1.626 m), weight 59.875 kg (132 lb), SpO2 96 %. Body mass index is 22.65 kg/(m^2). Lab Results:   No results found for this or any previous visit (from the past 72 hour(s)).  Physical Findings: AIMS: Facial and Oral Movements Muscles of Facial Expression: None, normal Lips and Perioral Area: None, normal Jaw: None, normal Tongue: None, normal,Extremity Movements Upper (arms, wrists, hands, fingers): None, normal Lower (legs, knees, ankles, toes): None, normal, Trunk Movements Neck, shoulders, hips: None, normal, Overall Severity Severity of abnormal movements (highest score from  questions above): None, normal Incapacitation due to abnormal movements: None, normal Patient's awareness of abnormal movements (rate only patient's report): No Awareness, Dental Status Current problems with teeth and/or dentures?: No Does patient usually wear dentures?: No  CIWA:  CIWA-Ar Total: 0 COWS:      See Psychiatric Specialty Exam and Suicide Risk Assessment completed by Attending Physician prior to discharge.  Discharge destination:  Home  Is patient on multiple antipsychotic therapies at discharge:  No   Has Patient had three or more failed trials of antipsychotic monotherapy by history:  No    Recommended Plan for Multiple Antipsychotic Therapies: NA  Discharge Instructions    Diet - low sodium heart healthy    Complete by:  As directed      Increase activity slowly    Complete by:  As directed             Medication List    STOP taking these medications        LORazepam 1 MG tablet  Commonly known as:  ATIVAN      TAKE these medications       Indication   gabapentin 300 MG capsule  Commonly known as:  NEURONTIN  Take 900 mg by mouth 3 (three) times daily.      pantoprazole 40 MG tablet  Commonly known as:  PROTONIX  Take 1 tablet (40 mg total) by mouth 2 (two) times daily before a meal.   Indication:  Gastroesophageal Reflux Disease     propranolol 10 MG tablet  Commonly known as:  INDERAL  Take 10 mg by mouth 3 (three) times daily.      traZODone 150 MG tablet  Commonly known as:  DESYREL  Take 300 mg by mouth at bedtime.      venlafaxine XR 150 MG 24 hr capsule  Commonly known as:  EFFEXOR-XR  Take 300 mg by mouth daily with breakfast.          Follow-up recommendations:  Activity:  As tolerated. Diet:  Low sodium heart healthy. Other:  Keep follow-up appointments.  Comments:    Total Discharge Time: 35 min.  Signed: Palmer Fahrner 01/30/2015, 1:49 PM

## 2015-01-30 NOTE — [facility] Suicide Risk Assessment (Signed)
[facility] Discharge Suicide Risk Assessment   Demographic Factors:  Divorced or widowed, Caucasian and Living alone  Total Time spent with patient: 30 minutes  Musculoskeletal: Strength & Muscle Tone: within normal limits Gait & Station: normal Patient leans: N/A  Psychiatric Specialty Exam: Physical Exam  Nursing note and vitals reviewed.   Review of Systems  Gastrointestinal: Positive for nausea.  All other systems reviewed and are negative.   Blood pressure 103/70, pulse 85, temperature 98.5 F (36.9 C), temperature source Oral, resp. rate 20, height 5\' 4"  (1.626 m), weight 59.875 kg (132 lb), SpO2 96 %.Body mass index is 22.65 kg/(m^2).  General Appearance: Casual  Eye Contact::  Good  Speech:  Clear and [id number]  Volume:  Normal  Mood:  Euthymic  Affect:  Appropriate  Thought Process:  Goal Directed  Orientation:  Full (Time, Place, and Person)  Thought Content:  WDL  Suicidal Thoughts:  No  Homicidal Thoughts:  No  Memory:  Immediate;   Fair Recent;   Fair Remote;   Fair  Judgement:  Fair  Insight:  Fair  Psychomotor Activity:  Normal  Concentration:  Fair  Recall:  [employer] [place]  Language: Fair  Akathisia:  No  Handed:  Right  AIMS (if indicated):     Assets:  Communication Skills Desire for Improvement Financial Resources/Insurance Housing  Sleep:  Number of Hours: 7.5  Cognition: WNL  ADL's:  Intact   Have you used any form of tobacco in the last 30 days? (Cigarettes, Smokeless Tobacco, Cigars, and/or Pipes): Yes  Has this patient used any form of tobacco in the last 30 days? (Cigarettes, Smokeless Tobacco, Cigars, and/or Pipes) Yes, A prescription for an FDA-approved tobacco cessation medication was offered at discharge and the patient refused  Mental Status Per Nursing Assessment::   On Admission:  Suicidal ideation indicated by patient, Suicide plan  Current Mental Status by Physician: NA  Loss Factors: NA  Historical  Factors: Prior suicide attempts and Impulsivity  Risk Reduction Factors:   Sense of responsibility to family  Continued Clinical Symptoms:  Depression:   Comorbid alcohol abuse/dependence Severe Alcohol/Substance Abuse/Dependencies  Cognitive Features That Contribute To Risk:  None    Suicide Risk:  Minimal: No identifiable suicidal ideation.  Patients presenting with no risk factors but with morbid ruminations; may be classified as minimal risk based on the severity of the depressive symptoms  Principal Problem: Major depressive disorder, recurrent, severe without psychotic features Discharge Diagnoses:  Patient Active Problem List   Diagnosis Date Noted  . Alcohol use disorder, severe, dependence [F10.20] 01/26/2015  . Tobacco use disorder [Z72.0] 01/26/2015  . Alcohol withdrawal [F10.239] 01/08/2015  . Amputation stump pain [T87.89] 01/08/2015  . 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 GAD (generalized anxiety disorder) [F41.1] 12/24/2013  . Major depressive disorder, recurrent, severe without psychotic features [F33.2] 12/22/2013  . PTSD (post-traumatic stress disorder) [F43.10] 12/22/2013  . Alcohol dependence [F10.20] 12/21/2013      Plan Of Care/Follow-up recommendations:  Activity:  as tolerated. Diet:  low sodium heart healthy. Other:  keep follow up appointments.  Is patient on multiple antipsychotic therapies at discharge:  No   Has Patient had three or more failed trials of antipsychotic monotherapy by history:  No  Recommended Plan for Multiple Antipsychotic Therapies: NA    [name] 01/30/2015, 1:45 PM

## 2015-01-30 NOTE — Progress Notes (Signed)
Recreation Therapy Notes  Date: 08.09.16 Time: 3:00 pm Location: Craft Room  Group Topic: Goal Setting  Goal Area(s) Addresses:  Patients will write at least one goal. Patients will write at least one obstacle.  Behavioral Response: Attentive, Interactive  Intervention: Recovery Goal Chart  Activity: Patients were instructed to make a Recovery Goal Chart listing goals, obstacles, the date they started working on their goal and the date they completed their goal.  Education: LRT educated patients on healthy ways to celebrate reaching their goals.   Education Outcome: Acknowledges education/In group clarification offered  Clinical Observations/Feedback: Patient completed goal chart by listing 3 goals, obstacles, and the date she started working on her goals. Patient contributed to group discussion by stating she did have people that could help keep her focused on her goals.   Leonette Monarch, LRT/CTRS 01/30/2015 4:15 PM

## 2015-01-30 NOTE — Progress Notes (Signed)
Patient to discharge when discharge plan and transportation in place.

## 2015-01-30 NOTE — Progress Notes (Signed)
D: Patient denies SI/HI/AVH.   Patient affect and mood are anxious and depressed.  Patient did NOT attend evening group. Patient visible on the milieu. No distress noted. A: Support and encouragement offered. Scheduled medications given to pt. Q 15 min checks continued for patient safety. R: Patient receptive. Patient remains safe on the unit.

## 2015-01-30 NOTE — Progress Notes (Signed)
Recreation Therapy Notes  INPATIENT RECREATION TR PLAN  Patient Details Name: [name] MRN: [id number] DOB: 1956-04-21 Today's Date: 01/30/2015  Rec Therapy Plan Is patient appropriate for Therapeutic Recreation?: Yes Treatment times per week: At least 3 times a week TR Treatment/Interventions: 1:1 session, Group participation (Comment) (Appropriate participation in daily recreation therapy tx)  Discharge Criteria Pt will be discharged from therapy if:: Discharged Treatment plan/goals/alternatives discussed and agreed upon by:: Patient/family  Discharge Summary Short term goals set: See Care Plan Short term goals met: Complete, Adequate for discharge Progress toward goals comments: One-to-one attended Which groups?: Other (Comment), Goal setting (Self-expression) One-to-one attended: Self-esteem, decision making, coping skills Reason goals not met: N/A Therapeutic equipment acquired: None Reason patient discharged from therapy: Discharge from hospital Pt/family agrees with progress & goals achieved: Yes Date patient discharged from therapy: 01/30/15   Leonette Monarch, LRT/CTRS 01/30/2015, 4:38 PM

## 2015-01-31 NOTE — Progress Notes (Signed)
AVS H&P Discharge Summary faxed to [facility] for hospital follow-up

## 2015-06-30 ENCOUNTER — Inpatient Hospital Stay: Payer: Medicare Other

## 2015-06-30 ENCOUNTER — Encounter: Payer: Self-pay | Admitting: Emergency Medicine

## 2015-06-30 ENCOUNTER — Inpatient Hospital Stay
Admission: EM | Admit: 2015-06-30 | Discharge: 2015-07-03 | DRG: 897 | Disposition: A | Discharge status: Other Acute Inpatient Hospital | Payer: Medicare Other | Attending: Internal Medicine | Admitting: Internal Medicine

## 2015-06-30 DIAGNOSIS — E860 Dehydration: Secondary | ICD-10-CM | POA: Diagnosis present

## 2015-06-30 DIAGNOSIS — N179 Acute kidney failure, unspecified: Secondary | ICD-10-CM | POA: Diagnosis present

## 2015-06-30 DIAGNOSIS — Z888 Allergy status to other drugs, medicaments and biological substances status: Secondary | ICD-10-CM | POA: Diagnosis not present

## 2015-06-30 DIAGNOSIS — F4310 Post-traumatic stress disorder, unspecified: Secondary | ICD-10-CM | POA: Diagnosis present

## 2015-06-30 DIAGNOSIS — Z885 Allergy status to narcotic agent status: Secondary | ICD-10-CM | POA: Diagnosis not present

## 2015-06-30 DIAGNOSIS — E871 Hypo-osmolality and hyponatremia: Secondary | ICD-10-CM | POA: Diagnosis present

## 2015-06-30 DIAGNOSIS — D72829 Elevated white blood cell count, unspecified: Secondary | ICD-10-CM | POA: Diagnosis present

## 2015-06-30 DIAGNOSIS — F1994 Other psychoactive substance use, unspecified with psychoactive substance-induced mood disorder: Secondary | ICD-10-CM | POA: Diagnosis present

## 2015-06-30 DIAGNOSIS — F17210 Nicotine dependence, cigarettes, uncomplicated: Secondary | ICD-10-CM | POA: Diagnosis present

## 2015-06-30 DIAGNOSIS — R45851 Suicidal ideations: Secondary | ICD-10-CM | POA: Diagnosis present

## 2015-06-30 DIAGNOSIS — Z825 Family history of asthma and other chronic lower respiratory diseases: Secondary | ICD-10-CM

## 2015-06-30 DIAGNOSIS — F10929 Alcohol use, unspecified with intoxication, unspecified: Secondary | ICD-10-CM

## 2015-06-30 DIAGNOSIS — F1010 Alcohol abuse, uncomplicated: Secondary | ICD-10-CM | POA: Diagnosis present

## 2015-06-30 DIAGNOSIS — F332 Major depressive disorder, recurrent severe without psychotic features: Secondary | ICD-10-CM | POA: Diagnosis present

## 2015-06-30 DIAGNOSIS — F10939 Alcohol use, unspecified with withdrawal, unspecified: Secondary | ICD-10-CM | POA: Diagnosis present

## 2015-06-30 DIAGNOSIS — Z811 Family history of alcohol abuse and dependence: Secondary | ICD-10-CM | POA: Diagnosis not present

## 2015-06-30 DIAGNOSIS — Y907 Blood alcohol level of 200-239 mg/100 ml: Secondary | ICD-10-CM | POA: Diagnosis present

## 2015-06-30 DIAGNOSIS — E876 Hypokalemia: Secondary | ICD-10-CM | POA: Diagnosis present

## 2015-06-30 DIAGNOSIS — F10239 Alcohol dependence with withdrawal, unspecified: Secondary | ICD-10-CM | POA: Diagnosis present

## 2015-06-30 DIAGNOSIS — G629 Polyneuropathy, unspecified: Secondary | ICD-10-CM | POA: Diagnosis present

## 2015-06-30 DIAGNOSIS — R000 Tachycardia, unspecified: Secondary | ICD-10-CM

## 2015-06-30 DIAGNOSIS — F418 Other specified anxiety disorders: Secondary | ICD-10-CM | POA: Diagnosis present

## 2015-06-30 HISTORY — DX: Alcohol abuse, uncomplicated: F10.10

## 2015-06-30 LAB — CBC
HCT: 50.3 % — ABNORMAL HIGH (ref 35.0–47.0)
Hemoglobin: 17 g/dL — ABNORMAL HIGH (ref 12.0–16.0)
MCH: 30.9 pg (ref 26.0–34.0)
MCHC: 33.9 g/dL (ref 32.0–36.0)
MCV: 91.3 fL (ref 80.0–100.0)
PLATELETS: 299 10*3/uL (ref 150–440)
RBC: 5.51 MIL/uL — AB (ref 3.80–5.20)
RDW: 13.8 % (ref 11.5–14.5)
WBC: 16.5 10*3/uL — ABNORMAL HIGH (ref 3.6–11.0)

## 2015-06-30 LAB — COMPREHENSIVE METABOLIC PANEL
ALK PHOS: 91 U/L (ref 38–126)
ALT: 23 U/L (ref 14–54)
AST: 33 U/L (ref 15–41)
Albumin: 4.5 g/dL (ref 3.5–5.0)
Anion gap: 24 — ABNORMAL HIGH (ref 5–15)
BILIRUBIN TOTAL: 0.5 mg/dL (ref 0.3–1.2)
BUN: 33 mg/dL — AB (ref 6–20)
CALCIUM: 8.7 mg/dL — AB (ref 8.9–10.3)
CO2: 13 mmol/L — ABNORMAL LOW (ref 22–32)
CREATININE: 1.26 mg/dL — AB (ref 0.44–1.00)
Chloride: 97 mmol/L — ABNORMAL LOW (ref 101–111)
GFR, EST AFRICAN AMERICAN: 52 mL/min — AB (ref >60)
GFR, EST NON AFRICAN AMERICAN: 45 mL/min — AB (ref >60)
Glucose, Bld: 170 mg/dL — ABNORMAL HIGH (ref 65–99)
Potassium: 3.3 mmol/L — ABNORMAL LOW (ref 3.5–5.1)
SODIUM: 134 mmol/L — AB (ref 135–145)
TOTAL PROTEIN: 8 g/dL (ref 6.5–8.1)

## 2015-06-30 LAB — GLUCOSE, CAPILLARY: Glucose-Capillary: 92 mg/dL (ref 65–99)

## 2015-06-30 LAB — ETHANOL: ALCOHOL ETHYL (B): 221 mg/dL — AB (ref <5)

## 2015-06-30 LAB — SALICYLATE LEVEL: Salicylate Lvl: <4 mg/dL (ref 2.8–30.0)

## 2015-06-30 LAB — MAGNESIUM: MAGNESIUM: 2.4 mg/dL (ref 1.7–2.4)

## 2015-06-30 LAB — TSH: TSH: 0.58 u[IU]/mL (ref 0.350–4.500)

## 2015-06-30 LAB — ACETAMINOPHEN LEVEL: Acetaminophen (Tylenol), Serum: <10 ug/mL — ABNORMAL LOW (ref 10–30)

## 2015-06-30 MED ORDER — LORAZEPAM 1 MG PO TABS
1.0000 mg | ORAL_TABLET | Freq: Four times a day (QID) | ORAL | Status: AC | PRN
  Administered 2015-07-03: 14:00:00 1 mg via ORAL
  Filled 2015-06-30: qty 1

## 2015-06-30 MED ORDER — ACETAMINOPHEN 325 MG PO TABS
650.0000 mg | ORAL_TABLET | Freq: Four times a day (QID) | ORAL | Status: DC | PRN
  Administered 2015-07-02: 650 mg via ORAL
  Filled 2015-06-30: qty 2

## 2015-06-30 MED ORDER — LORAZEPAM 2 MG/ML IJ SOLN
0.0000 mg | Freq: Four times a day (QID) | INTRAMUSCULAR | Status: DC
Start: 2015-06-30 — End: 2015-06-30
  Administered 2015-06-30 (×2): 2 mg via INTRAVENOUS
  Filled 2015-06-30: qty 1
  Filled 2015-06-30: qty 2

## 2015-06-30 MED ORDER — VITAMIN B-1 100 MG PO TABS
100.0000 mg | ORAL_TABLET | Freq: Every day | ORAL | Status: DC
  Administered 2015-06-30 – 2015-07-01 (×2): 100 mg via ORAL
  Filled 2015-06-30 (×3): qty 1

## 2015-06-30 MED ORDER — LORAZEPAM 2 MG/ML IJ SOLN
1.0000 mg | Freq: Once | INTRAMUSCULAR | Status: AC
  Administered 2015-06-30: 1 mg via INTRAVENOUS

## 2015-06-30 MED ORDER — ENOXAPARIN SODIUM 40 MG/0.4ML ~~LOC~~ SOLN
40.0000 mg | Freq: Every day | SUBCUTANEOUS | Status: DC
  Administered 2015-07-01 – 2015-07-02 (×2): 40 mg via SUBCUTANEOUS
  Filled 2015-06-30 (×3): qty 0.4

## 2015-06-30 MED ORDER — TRAZODONE HCL 100 MG PO TABS
300.0000 mg | ORAL_TABLET | Freq: Every day | ORAL | Status: DC
  Administered 2015-06-30 – 2015-07-03 (×4): 300 mg via ORAL
  Filled 2015-06-30 (×4): qty 3

## 2015-06-30 MED ORDER — THIAMINE HCL 100 MG/ML IJ SOLN
100.0000 mg | Freq: Every day | INTRAMUSCULAR | Status: DC
  Administered 2015-07-01: 09:00:00 100 mg via INTRAVENOUS
  Filled 2015-06-30: qty 2

## 2015-06-30 MED ORDER — VENLAFAXINE HCL ER 75 MG PO CP24
300.0000 mg | ORAL_CAPSULE | Freq: Every day | ORAL | Status: DC
  Administered 2015-07-01 – 2015-07-03 (×3): 300 mg via ORAL
  Filled 2015-06-30 (×3): qty 4

## 2015-06-30 MED ORDER — SODIUM CHLORIDE 0.9 % IV BOLUS (SEPSIS)
1000.0000 mL | Freq: Once | INTRAVENOUS | Status: AC
  Administered 2015-06-30: 1000 mL via INTRAVENOUS

## 2015-06-30 MED ORDER — SODIUM CHLORIDE 0.9 % IV SOLN
INTRAVENOUS | Status: DC
  Administered 2015-06-30 – 2015-07-02 (×4): via INTRAVENOUS

## 2015-06-30 MED ORDER — LORAZEPAM 2 MG/ML IJ SOLN
2.0000 mg | Freq: Once | INTRAMUSCULAR | Status: AC
  Administered 2015-06-30: 2 mg via INTRAVENOUS

## 2015-06-30 MED ORDER — LORAZEPAM 2 MG/ML IJ SOLN
0.0000 mg | INTRAMUSCULAR | Status: AC
  Administered 2015-06-30: 2 mg via INTRAVENOUS
  Administered 2015-07-01 (×3): 1 mg via INTRAVENOUS
  Administered 2015-07-01: 17:00:00 2 mg via INTRAVENOUS
  Administered 2015-07-02 (×4): 1 mg via INTRAVENOUS
  Filled 2015-06-30 (×6): qty 1

## 2015-06-30 MED ORDER — ADULT MULTIVITAMIN W/MINERALS CH
1.0000 | ORAL_TABLET | Freq: Every day | ORAL | Status: DC
  Administered 2015-06-30 – 2015-07-03 (×4): 1 via ORAL
  Filled 2015-06-30 (×4): qty 1

## 2015-06-30 MED ORDER — LORAZEPAM 2 MG/ML IJ SOLN
1.0000 mg | Freq: Four times a day (QID) | INTRAMUSCULAR | Status: AC | PRN
  Filled 2015-06-30 (×2): qty 1

## 2015-06-30 MED ORDER — SODIUM CHLORIDE 0.9 % IJ SOLN
3.0000 mL | Freq: Two times a day (BID) | INTRAMUSCULAR | Status: DC
  Administered 2015-07-01 – 2015-07-03 (×4): 3 mL via INTRAVENOUS

## 2015-06-30 MED ORDER — LORAZEPAM 2 MG/ML IJ SOLN
INTRAMUSCULAR | Status: AC
  Filled 2015-06-30: qty 1

## 2015-06-30 MED ORDER — PROPRANOLOL HCL 10 MG PO TABS
10.0000 mg | ORAL_TABLET | Freq: Three times a day (TID) | ORAL | Status: DC
  Administered 2015-06-30 – 2015-07-03 (×10): 10 mg via ORAL
  Filled 2015-06-30 (×10): qty 1

## 2015-06-30 MED ORDER — FOLIC ACID 1 MG PO TABS
1.0000 mg | ORAL_TABLET | Freq: Every day | ORAL | Status: DC
  Administered 2015-06-30 – 2015-07-03 (×4): 1 mg via ORAL
  Filled 2015-06-30 (×4): qty 1

## 2015-06-30 MED ORDER — VITAMIN B-1 100 MG PO TABS
100.0000 mg | ORAL_TABLET | Freq: Every day | ORAL | Status: DC
  Administered 2015-06-30 – 2015-07-03 (×4): 100 mg via ORAL
  Filled 2015-06-30 (×3): qty 1

## 2015-06-30 MED ORDER — LORAZEPAM 2 MG PO TABS: 0.0000 mg | ORAL_TABLET | Freq: Four times a day (QID) | ORAL | Status: DC

## 2015-06-30 MED ORDER — POTASSIUM CHLORIDE 10 MEQ/100ML IV SOLN
10.0000 meq | INTRAVENOUS | Status: AC
  Administered 2015-06-30 (×2): 10 meq via INTRAVENOUS
  Filled 2015-06-30 (×2): qty 100

## 2015-06-30 MED ORDER — LORAZEPAM 2 MG PO TABS: 0.0000 mg | ORAL_TABLET | Freq: Two times a day (BID) | ORAL | Status: DC

## 2015-06-30 MED ORDER — MAGNESIUM SULFATE 2 GM/50ML IV SOLN: 2.0000 g | Freq: Once | INTRAVENOUS | Status: DC

## 2015-06-30 MED ORDER — ACETAMINOPHEN 650 MG RE SUPP: 650.0000 mg | Freq: Four times a day (QID) | RECTAL | Status: DC | PRN

## 2015-06-30 MED ORDER — LORAZEPAM 2 MG/ML IJ SOLN
0.0000 mg | Freq: Two times a day (BID) | INTRAMUSCULAR | Status: DC
  Administered 2015-07-02: 23:00:00 1 mg via INTRAVENOUS
  Administered 2015-07-03: 10:00:00 2 mg via INTRAVENOUS
  Filled 2015-06-30 (×2): qty 1

## 2015-06-30 MED ORDER — LORAZEPAM 2 MG/ML IJ SOLN
INTRAMUSCULAR | Status: AC
  Administered 2015-06-30: 1 mg via INTRAVENOUS
  Filled 2015-06-30: qty 1

## 2015-06-30 MED ORDER — LORAZEPAM 2 MG/ML IJ SOLN
0.0000 mg | Freq: Two times a day (BID) | INTRAMUSCULAR | Status: DC
  Administered 2015-06-30: 2 mg via INTRAVENOUS

## 2015-06-30 MED ORDER — GABAPENTIN 300 MG PO CAPS
900.0000 mg | ORAL_CAPSULE | Freq: Three times a day (TID) | ORAL | Status: DC
  Administered 2015-06-30 – 2015-07-03 (×10): 900 mg via ORAL
  Filled 2015-06-30 (×10): qty 3

## 2015-06-30 NOTE — ED Notes (Signed)
Key to safe sent w/ patient.

## 2015-06-30 NOTE — ED Notes (Signed)
Attempted to call report

## 2015-06-30 NOTE — H&P (Signed)
[facility] [place] NAME: [name]    MR#:  [id number]  DATE OF BIRTH:  10-20-1955  DATE OF ADMISSION:  06/30/2015  PRIMARY CARE PHYSICIAN: No primary care provider on file.   REQUESTING/REFERRING PHYSICIAN: Dr. Archie Balboa  CHIEF COMPLAINT:   Chief Complaint  Patient presents with  . Suicidal  . Alcohol Problem    HISTORY OF PRESENT ILLNESS:  [name]  is a 60 y.o. female with a known history of alcohol abuse. She states that she stopped drinking for 5 years prior to starting drinking over Christmas time. She states that the withdrawal is the worst she's ever had. She's been drinking a case of beer a day and she's never been this sick. She is shaking, very nauseous, her mouth hurts and she is sweating. Her last drink was this morning. Alcohol level was 221 on presentation at 12:45 PM. As per the ER physician she's received quite a bit of Ativan in the emergency room and he is concerned that she's going through withdrawal.  PAST MEDICAL HISTORY:   Past Medical History  Diagnosis Date  . Depression   . Anxiety   . Neuropathy ([place])   . Insomnia   . ETOH abuse     PAST SURGICAL HISTORY:   Past Surgical History  Procedure Laterality Date  . Cesarean section    . Joint replacement Right   . 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 Abdominal hysterectomy    . Right hip surgery      SOCIAL HISTORY:   Social History  Substance Use Topics  . Smoking status: Current Every Day Smoker -- 0.50 packs/day    Types: Cigarettes  . Smokeless tobacco: Never Used  . Alcohol Use: 14.4 oz/week    24 Cans of beer per week     Comment: 1 case per day    FAMILY HISTORY:   Family History  Problem Relation Age of Onset  . COPD Mother   . Alcoholism Father   . COPD Father     DRUG ALLERGIES:   Allergies  Allergen Reactions  . Benadryl [Diphenhydramine] Hives and Other (See Comments)    Reaction:  Hyperactivity   .  Cephalosporins Hives  . Codeine Nausea And Vomiting    REVIEW OF SYSTEMS:  CONSTITUTIONAL: Positive for fever, chills and sweats. Positive for fatigue.  EYES: No blurred or double vision.  EARS, NOSE, AND THROAT: No tinnitus or ear pain. Positive for sore throat and dysphagia. Positive for postnasal drip RESPIRATORY: Positive for cough and shortness of breath. No wheezing or hemoptysis.  CARDIOVASCULAR: No chest pain, orthopnea, edema.  GASTROINTESTINAL: No nausea, vomiting, diarrhea or abdominal pain. No blood in bowel movements GENITOURINARY: No dysuria, hematuria.  ENDOCRINE: No polyuria, nocturia,  HEMATOLOGY: No anemia, easy bruising or bleeding SKIN: No rash or lesion. MUSCULOSKELETAL: No joint pain or arthritis.   NEUROLOGIC: No tingling, numbness, weakness.  PSYCHIATRY: Positive for anxiety and depression.   MEDICATIONS AT HOME:   Prior to Admission medications   Medication Sig Start Date End Date Taking? Authorizing Provider  gabapentin (NEURONTIN) 300 MG capsule Take 900 mg by mouth 3 (three) times daily.   Yes Historical Provider, MD  propranolol (INDERAL) 10 MG tablet Take 10 mg by mouth 3 (three) times daily.   Yes Historical Provider, MD  traZODone (DESYREL) 150 MG tablet Take 2 tablets (300 mg total) by mouth at bedtime. 01/30/15  Yes Clovis Fredrickson, MD  venlafaxine XR (EFFEXOR-XR) 150 MG 24 hr capsule Take 2 capsules (300 mg total) by mouth daily with breakfast. 01/30/15  Yes [name] Pucilowska, MD      VITAL SIGNS:  Blood pressure 137/68, pulse 137, temperature 97.9 F (36.6 C), temperature source Oral, resp. rate 22, height 5\' 4"  (1.626 m), weight 61.236 kg (135 lb), SpO2 97 %.  PHYSICAL EXAMINATION:  GENERAL:  60 y.o.-year-old patient lying in the bed with no acute distress.  EYES: Pupils equal, round, reactive to light and accommodation. No scleral icterus. Extraocular muscles intact.  HEENT: Head atraumatic, normocephalic. Oropharynx and nasopharynx  clear.  NECK:  Supple, no jugular venous distention. No thyroid enlargement, no tenderness.  LUNGS: Normal breath sounds bilaterally, no wheezing, rales,rhonchi or crepitation. No use of accessory muscles of respiration.  CARDIOVASCULAR: S1, S2 tachycardic. No murmurs, rubs, or gallops.  ABDOMEN: Soft, nontender, nondistended. Bowel sounds present. No organomegaly or mass.  EXTREMITIES: No pedal edema, cyanosis, or clubbing.  NEUROLOGIC: Cranial nerves II through XII are intact. Muscle strength 5/5 in all extremities. Sensation intact. Gait not checked. Positive tremor right arm PSYCHIATRIC: The patient is alert and oriented x 3.  SKIN: No rash, lesion, or ulcer.   LABORATORY PANEL:   CBC  Recent Labs Lab 06/30/15 1245  WBC 16.5*  HGB 17.0*  HCT 50.3*  PLT 299   ------------------------------------------------------------------------------------------------------------------  Chemistries   Recent Labs Lab 06/30/15 1245  NA 134*  K 3.3*  CL 97*  CO2 13*  GLUCOSE 170*  BUN 33*  CREATININE 1.26*  CALCIUM 8.7*  AST 33  ALT 23  ALKPHOS 91  BILITOT 0.5   ------------------------------------------------------------------------------------------------------------------   EKG:   Sinus tachycardia 127 bpm  IMPRESSION AND PLAN:   1. Alcohol withdrawal. I will put on CIWA protocol. Give IV thiamine. I'll give IV fluid hydration. 2. Hypokalemia- replace potassium IV. Also replace magnesium and check a magnesium. 3. Hyponatremia likely with dehydration 4. Impaired fasting glucose check a hemoglobin A1c 5. Leukocytosis- check a urine analysis and the chest x-ray 6. Anxiety depression- patient was put in for psych consultation from the ER. 7. Tobacco abuse smoking cessation counseling done 3 minutes by me. Patient refused nicotine patch  All the records are reviewed and case discussed with ED provider. Management plans discussed with the patient, family and she is in  agreement.  CODE STATUS: Full code  TOTAL TIME TAKING CARE OF THIS PATIENT: 50 minutes.    Loletha Grayer M.D on 06/30/2015 at 8:26 PM  Between 7am to 6pm - Pager - [phone]  After 6pm call admission pager [facility]  Office  [phone]  CC: Primary care physician; No primary care provider on file.

## 2015-06-30 NOTE — ED Notes (Signed)
MD at bedside. 

## 2015-06-30 NOTE — ED Notes (Signed)
MD Archie Balboa notified

## 2015-06-30 NOTE — BH Assessment (Addendum)
Tele Assessment Note   NYANNA Gray is an 60 y.o. female that presents this date stating she relapsed on alcohol over the Christmas Holidays due to problems with her current relationship. Patient reports ongoing daily use consuming 12 to 24 beers a day since Christmas but was a poor historian in reference to how much alcohol she consumed this date or her previous treatment episodes. Patient states she has been on mental health medications and has discontinued them in the last week due to her alcohol use. Patient does admit to multiple attempts at trying to maintain her sobriety but has been unsuccessful although stated she had maintained her sobriety for "a little while" prior to this relapse. Patient stated she has active SI over a recently failed relationship and was trying to "drink herself to death." Patient stated she "wants to be done with it all." Patient was difficult to understand and impaired at the time of the assessment. Patient had to be redirected several times before assessment information could be obtained. Case was staffed with Withrow DNP and meets inpatient criteria as appropriate bed placement is investigated. Disposition was given to [place] at [facility].      Diagnosis: 296.33 Major Depressive Disorder, 296.40 Bi-polar, 303.90 Alcohol use disorder, severe  Past Medical History:  Past Medical History  Diagnosis Date  . Depression   . Anxiety   . Neuropathy ([place])   . Insomnia   . ETOH abuse     Past Surgical History  Procedure Laterality Date  . Cesarean section    . Joint replacement Right   . 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 Abdominal hysterectomy    . Right hip surgery      Family History:  Family History  Problem Relation Age of Onset  . COPD Mother   . Alcoholism Father     Social History:  reports that she has been smoking Cigarettes.  She has been smoking about 0.50 packs per day. She has never used smokeless tobacco. She reports that she drinks  about 14.4 oz of alcohol per week. She reports that she does not use illicit drugs.  Additional Social History:  Alcohol / Drug Use Pain Medications: See MAR Prescriptions: See MAR Over the Counter: See MAR History of alcohol / drug use?: Yes Longest period of sobriety (when/how long): 6 months Withdrawal Symptoms: Agitation, Sweats, DTs, Tremors, Weakness Substance #1 Name of Substance 1: Alcohol 1 - Age of First Use: 18 1 - Amount (size/oz): 8oz beers 1 - Frequency: daily 1 - Duration: last 2 months  1 - Last Use / Amount: 06/30/15  CIWA: CIWA-Ar BP: (!) 147/53 mmHg Pulse Rate: (!) 160 (md notified. one time dose ordered) Nausea and Vomiting: intermittent nausea with dry heaves Tactile Disturbances: none Tremor: three Auditory Disturbances: not present Paroxysmal Sweats: barely perceptible sweating, palms moist Visual Disturbances: not present Anxiety: two Headache, Fullness in Head: none present Agitation: two Orientation and Clouding of Sensorium: oriented and can do serial additions CIWA-Ar Total: 12 COWS:    PATIENT STRENGTHS: (choose at least two) General fund of knowledge Motivation for treatment/growth  Allergies:  Allergies  Allergen Reactions  . Benadryl [Diphenhydramine] Hives and Other (See Comments)    Reaction:  Hyperactivity   . Cephalosporins Hives  . Codeine Nausea And Vomiting    Home Medications:  (Not in a hospital admission)  OB/GYN Status:  No LMP recorded. Patient has had a hysterectomy.  General Assessment Data Location of Assessment: [facility] ED  TTS Assessment: In system Is this a Tele or Face-to-Face Assessment?: Tele Assessment Is this an Initial Assessment or a Re-assessment for this encounter?: Initial Assessment Marital status: Divorced [place] name: Quickle Is patient pregnant?: No Pregnancy Status: No Living Arrangements: Alone Can pt return to current living arrangement?: Yes Admission Status: Voluntary Is patient capable of  signing voluntary admission?: Yes Referral Source: Self/Family/Friend Insurance type: Medicaid  Medical Screening Exam ([place]) Medical Exam completed: Yes  Crisis Care Plan Living Arrangements: Alone Legal Guardian: Other: (none) Name of Psychiatrist: Ward MD Name of Therapist: none  Education Status Is patient currently in school?: No Current Grade: na Highest grade of school patient has completed: 10 Name of school: na Contact person: na  Risk to self with the past 6 months Suicidal Ideation: Yes-Currently Present Has patient been a risk to self within the past 6 months prior to admission? : No Suicidal Intent: Yes-Currently Present Has patient had any suicidal intent within the past 6 months prior to admission? : No Is patient at risk for suicide?: Yes Suicidal Plan?: Yes-Currently Present Has patient had any suicidal plan within the past 6 months prior to admission? : Yes Specify Current Suicidal Plan: Patient states she is going to "drink herself to death" Access to Means: Yes Specify Access to Suicidal Means: Patient has assess to alcohol. What has been your use of drugs/alcohol within the last 12 months?: Daily Previous Attempts/Gestures: Yes How many times?: 2 Other Self Harm Risks: none Triggers for Past Attempts: None known Intentional Self Injurious Behavior: None Family Suicide History: No Recent stressful life event(s): Loss (Comment) (Relationship issues) Persecutory voices/beliefs?: No Depression: Yes Depression Symptoms: Despondent, Tearfulness Substance abuse history and/or treatment for substance abuse?: Yes Suicide prevention information given to non-admitted patients: Not applicable  Risk to Others within the past 6 months Homicidal Ideation: No Does patient have any lifetime risk of violence toward others beyond the six months prior to admission? : No Thoughts of Harm to Others: No Current Homicidal Intent: No Current Homicidal Plan:  No Access to Homicidal Means: No Identified Victim: na History of harm to others?: No Assessment of Violence: None Noted Violent Behavior Description: na Does patient have access to weapons?: No Criminal Charges Pending?: No Does patient have a court date: No Is patient on probation?: No  Psychosis Hallucinations: None noted Delusions: None noted  Mental Status Report Appearance/Hygiene: In scrubs Eye Contact: Poor Motor Activity: Unsteady, Agitation Speech: Slurred Level of Consciousness: Drowsy Mood: Depressed Affect: Depressed Anxiety Level: Minimal Thought Processes: Coherent, Relevant Judgement: Unimpaired Orientation: Person, Place, Time Obsessive Compulsive Thoughts/Behaviors: None  Cognitive Functioning Concentration: Decreased Memory: Recent Intact, Remote Intact IQ: Average Insight: Poor Impulse Control: Poor Appetite: Fair Weight Loss: 0 Weight Gain: 0 Sleep: No Change Total Hours of Sleep: 6 Vegetative Symptoms: None  ADLScreening [facility] Assessment Services) Patient's cognitive ability adequate to safely complete daily activities?: Yes Patient able to express need for assistance with ADLs?: Yes Independently performs ADLs?: Yes (appropriate for developmental age)  Prior Inpatient Therapy Prior Inpatient Therapy: Yes Prior Therapy Dates: 2016 Prior Therapy Facilty/Provider(s): Patient can't remember Reason for Treatment: Alcohol use  Prior Outpatient Therapy Prior Outpatient Therapy: No Prior Therapy Dates: unknown Prior Therapy Facilty/Provider(s): unknown Reason for Treatment: Alcohol dependence Does patient have an ACCT team?: No Does patient have Intensive In-House Services?  : No Does patient have [facility] services? : No Does patient have P4CC services?: No  ADL Screening (condition at time of admission) Patient's cognitive ability  adequate to safely complete daily activities?: Yes Is the patient deaf or have difficulty hearing?: No Does  the patient have difficulty seeing, even when wearing glasses/contacts?: No Does the patient have difficulty concentrating, remembering, or making decisions?: No Patient able to express need for assistance with ADLs?: Yes Does the patient have difficulty dressing or bathing?: No Independently performs ADLs?: Yes (appropriate for developmental age) Does the patient have difficulty walking or climbing stairs?: No Weakness of Legs: None Weakness of Arms/Hands: None  Home Assistive Devices/Equipment Home Assistive Devices/Equipment: None  Therapy Consults (therapy consults require a physician order) PT Evaluation Needed: No OT Evalulation Needed: No SLP Evaluation Needed: No Abuse/Neglect Assessment (Assessment to be complete while patient is alone) Physical Abuse: Denies Verbal Abuse: Denies Sexual Abuse: Denies Exploitation of patient/patient's resources: Denies Self-Neglect: Denies Values / Beliefs Cultural Requests During Hospitalization: None Spiritual Requests During Hospitalization: None Consults Spiritual Care Consult Needed: No Social Work Consult Needed: No Regulatory affairs officer (For Healthcare) Does patient have an advance directive?: No    Additional Information 1:1 In Past 12 Months?: No CIRT Risk: No Elopement Risk: No Does patient have medical clearance?: Yes     Disposition: Case was staffed with Withrow DNP and meets inpatient criteria as appropriate bed placement is investigated. Disposition was given to [place] at [facility].      Disposition Initial Assessment Completed for this Encounter: Yes Disposition of Patient: Inpatient treatment program Type of inpatient treatment program: Adult  Mamie Nick 06/30/2015 4:49 PM

## 2015-06-30 NOTE — ED Provider Notes (Signed)
[facility] Emergency Department Provider Note  ____________________________________________  Time seen: 1258  I have reviewed the triage vital signs and the nursing notes.  History by:  Patient  HISTORY  Chief Complaint Suicidal and Alcohol Problem     HPI [name] is a 60 y.o. female arrives in the emergency department by EMS. Patient reports that she has an alcohol abuse problem. She had gone 5 years sober but began drinking this Christmas again. She has been drinking heavily since. She tried to detox at home, but was unsuccessful. Her last drink was this morning. She felt she needed come to the hospital for further treatment for her alcohol dependence. Patient does report that she has some depression and suicidal ideation, but denies any specific plan or intent to hurt herself or kill herself. She does have a history of an ingestion of Valium with alcohol "a long time ago" (greater than 5 years ago).  Patient reports she feels jittery and her legs will not stop shaking.    Past Medical History  Diagnosis Date  . Depression   . Anxiety   . Neuropathy ([place])   . Insomnia   . ETOH abuse     Patient Active Problem List   Diagnosis Date Noted  . Alcohol use disorder, severe, dependence ([place]) 01/26/2015  . Tobacco use disorder 01/26/2015  . Alcohol withdrawal ([place]) 01/08/2015  . Amputation stump pain ([place]) 01/08/2015  . 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 GAD (generalized anxiety disorder) 12/24/2013  . Major depressive disorder, recurrent, severe without psychotic features ([place]) 12/22/2013  . PTSD (post-traumatic stress disorder) 12/22/2013  . Alcohol dependence ([place]) 12/21/2013    Past Surgical History  Procedure Laterality Date  . Cesarean section    . Joint replacement Right   . 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 Abdominal hysterectomy    . Right hip surgery      Current Outpatient Rx  Name  Route   Sig  Dispense  Refill  . gabapentin (NEURONTIN) 300 MG capsule   Oral   Take 900 mg by mouth 3 (three) times daily.         . propranolol (INDERAL) 10 MG tablet   Oral   Take 10 mg by mouth 3 (three) times daily.         . traZODone (DESYREL) 150 MG tablet   Oral   Take 2 tablets (300 mg total) by mouth at bedtime.   60 tablet   0   . venlafaxine XR (EFFEXOR-XR) 150 MG 24 hr capsule   Oral   Take 2 capsules (300 mg total) by mouth daily with breakfast.   60 capsule   0     Allergies Benadryl; Cephalosporins; and Codeine  Family History  Problem Relation Age of Onset  . COPD Mother   . Alcoholism Father     Social History Social History  Substance Use Topics  . Smoking status: Current Every Day Smoker -- 0.50 packs/day    Types: Cigarettes  . Smokeless tobacco: Never Used  . Alcohol Use: 14.4 oz/week    24 Cans of beer per week     Comment: 1 case per day    Review of Systems  Constitutional: Negative for fever/chills. ENT: Negative for congestion. Cardiovascular: Negative for chest pain. Respiratory: Negative for cough. Gastrointestinal: Negative for abdominal pain, vomiting and diarrhea. Genitourinary: Negative for dysuria. Musculoskeletal: Remote left arm amputation.  Skin: Negative for rash. Neurological:  Negative for headache or focal weakness. Positive for tremors. Psychiatric: Alcohol abuse, Depression, some thoughts of suicide but no specific plan. See history of present illness area  10-point ROS otherwise negative.  ____________________________________________   PHYSICAL EXAM:  VITAL SIGNS: ED Triage Vitals  Enc Vitals Group     BP 06/30/15 1243 146/67 mmHg     Pulse Rate 06/30/15 1243 140     Resp 06/30/15 1243 24     Temp 06/30/15 1243 97.9 F (36.6 C)     Temp Source 06/30/15 1243 Oral     SpO2 06/30/15 1243 94 %     Weight 06/30/15 1243 135 lb (61.236 kg)     Height 06/30/15 1243 5\' 4"  (1.626 m)     Head Cir --      Peak  Flow --      Pain Score --      Pain Loc --      Pain Edu? --      Excl. in [place]? --     Constitutional:  Alert and oriented. Appears tremulous and uncomfortable, but stable. ENT   Head: Normocephalic and atraumatic.   Nose: No congestion/rhinnorhea.       Mouth: No erythema, no swelling   Cardiovascular: Tachycardic at 1:30, regular rhythm, no murmur noted Respiratory:  Normal respiratory effort, no tachypnea.    Breath sounds are clear and equal bilaterally.  Gastrointestinal: Soft, no distention. Nontender Back: No muscle spasm, no tenderness, no CVA tenderness. Musculoskeletal: Above the elbow amputation of the left arm. No deformity or abnormality noted elsewhere. Neurologic:  Communicative. Patient is somewhat tremulous but moving her extremities in an ordinary fashion otherwise. Normal strength and sensation.  Skin:  Skin is warm, dry. No rash noted. Psychiatric: Somewhat anxious, tremulous, confirms depression and some suicidal thoughts, but no active plan. ____________________________________________    LABS (pertinent positives/negatives)  Labs Reviewed  COMPREHENSIVE METABOLIC PANEL - Abnormal; Notable for the following:    Sodium 134 (*)    Potassium 3.3 (*)    Chloride 97 (*)    CO2 13 (*)    Glucose, Bld 170 (*)    BUN 33 (*)    Creatinine, Ser 1.26 (*)    Calcium 8.7 (*)    GFR calc non Af Amer 45 (*)    GFR calc Af Amer 52 (*)    Anion gap 24 (*)    All other components within normal limits  ETHANOL - Abnormal; Notable for the following:    Alcohol, Ethyl (B) 221 (*)    All other components within normal limits  ACETAMINOPHEN LEVEL - Abnormal; Notable for the following:    Acetaminophen (Tylenol), Serum <10 (*)    All other components within normal limits  CBC - Abnormal; Notable for the following:    WBC 16.5 (*)    RBC 5.51 (*)    Hemoglobin 17.0 (*)    HCT 50.3 (*)    All other components within normal limits  SALICYLATE LEVEL  URINE DRUG  SCREEN, QUALITATIVE ([facility] ONLY)  URINALYSIS COMPLETEWITH MICROSCOPIC ([facility] ONLY)     ____________________________________________   EKG  ED ECG REPORT I, Almus Woodham W, the attending physician, personally viewed and interpreted this ECG.   Date: 06/30/2015  EKG Time: 1249  Rate: 127  Rhythm: Sinus tachycardia   Axis: Normal  Intervals: Normal  ST&T Change: None noted   ____________________________________________    RADIOLOGY    ____________________________________________   PROCEDURES   ____________________________________________   INITIAL IMPRESSION /  ASSESSMENT AND PLAN / ED COURSE  Pertinent labs & imaging results that were available during my care of the patient were reviewed by me and considered in my medical decision making (see chart for details).  Tremulous, anxious, 60 year old female with heavy alcohol use over the past 2-3 weeks. There is an odor resembling alcohol on her breath. I do not suspect she is in withdrawal currently, but we have treated her with 1 mg of Ativan and will increases as needed for her anxiety and tremulousness. We'll obtain a psychiatric consultation to establish a further treatment plan.  ----------------------------------------- 3:19 PM on 06/30/2015 -----------------------------------------  I have ordered a Ciwa protocol for this patient. She has received an additional 2 mg of Ativan recently. She has had episodes where she has been fairly calm and comfortable, however now she is again agitated and tachycardic. I have ordered 1 more milligram of Ativan IV and a second liter of IV fluids.  Patient's alcohol level is 221. She does have a bit of an elevated white count at 16.5. Urinalysis is pending.  Lateral chest x-ray as well. I will last my colleague, Dr. Archie Balboa to assume care for and follow-up with this patient in the emergency department.  ____________________________________________   FINAL CLINICAL  IMPRESSION(S) / ED DIAGNOSES  Final diagnoses:  Alcohol abuse  Acute alcohol intoxication, with unspecified complication ([place])  Tachycardia      Ahmed Prima, MD 06/30/15 1523

## 2015-06-30 NOTE — ED Notes (Signed)
Pt has been sober 5 years then started drinking christmas day. Drinks 24 beers a day since christmas. Started with nausea and vomiting today.  Reports SI and denies HI. Plan is drink self to death. Afraid to detox alone

## 2015-07-01 LAB — BASIC METABOLIC PANEL
ANION GAP: 4 — AB (ref 5–15)
BUN: 17 mg/dL (ref 6–20)
CALCIUM: 8 mg/dL — AB (ref 8.9–10.3)
CO2: 24 mmol/L (ref 22–32)
Chloride: 114 mmol/L — ABNORMAL HIGH (ref 101–111)
Creatinine, Ser: 0.62 mg/dL (ref 0.44–1.00)
GLUCOSE: 83 mg/dL (ref 65–99)
Potassium: 3.8 mmol/L (ref 3.5–5.1)
Sodium: 142 mmol/L (ref 135–145)

## 2015-07-01 LAB — CBC
HCT: 37.3 % (ref 35.0–47.0)
HEMOGLOBIN: 12.7 g/dL (ref 12.0–16.0)
MCH: 31.4 pg (ref 26.0–34.0)
MCHC: 34.2 g/dL (ref 32.0–36.0)
MCV: 91.8 fL (ref 80.0–100.0)
Platelets: 152 10*3/uL (ref 150–440)
RBC: 4.06 MIL/uL (ref 3.80–5.20)
RDW: 13.5 % (ref 11.5–14.5)
WBC: 9.3 10*3/uL (ref 3.6–11.0)

## 2015-07-01 LAB — URINE DRUG SCREEN, QUALITATIVE (ARMC ONLY)
AMPHETAMINES, UR SCREEN: NOT DETECTED
Barbiturates, Ur Screen: NOT DETECTED
Benzodiazepine, Ur Scrn: POSITIVE — AB
COCAINE METABOLITE, UR ~~LOC~~: NOT DETECTED
Cannabinoid 50 Ng, Ur ~~LOC~~: NOT DETECTED
MDMA (ECSTASY) UR SCREEN: NOT DETECTED
Methadone Scn, Ur: NOT DETECTED
OPIATE, UR SCREEN: NOT DETECTED
PHENCYCLIDINE (PCP) UR S: NOT DETECTED
Tricyclic, Ur Screen: NOT DETECTED

## 2015-07-01 LAB — URINALYSIS COMPLETE WITH MICROSCOPIC (ARMC ONLY)
Bilirubin Urine: NEGATIVE
Glucose, UA: NEGATIVE mg/dL
Leukocytes, UA: NEGATIVE
Nitrite: NEGATIVE
PH: 5 (ref 5.0–8.0)
PROTEIN: NEGATIVE mg/dL
Specific Gravity, Urine: 1.011 (ref 1.005–1.030)

## 2015-07-01 LAB — HEMOGLOBIN A1C: Hgb A1c MFr Bld: 5.2 % (ref 4.0–6.0)

## 2015-07-01 MED ORDER — MENTHOL 3 MG MT LOZG
1.0000 | LOZENGE | OROMUCOSAL | Status: DC | PRN
Start: 2015-07-01 — End: 2015-07-03
  Filled 2015-07-01 (×3): qty 9

## 2015-07-01 MED ORDER — ONDANSETRON HCL 4 MG/2ML IJ SOLN
4.0000 mg | Freq: Four times a day (QID) | INTRAMUSCULAR | Status: DC | PRN
  Administered 2015-07-01 – 2015-07-02 (×3): 4 mg via INTRAVENOUS
  Filled 2015-07-01 (×3): qty 2

## 2015-07-01 MED ORDER — NYSTATIN 100000 UNIT/GM EX POWD
Freq: Two times a day (BID) | CUTANEOUS | Status: DC
  Administered 2015-07-01 – 2015-07-03 (×7): via TOPICAL
  Filled 2015-07-01: qty 15

## 2015-07-01 NOTE — Progress Notes (Signed)
[name]    MR#:  [id number]  DATE OF BIRTH:  [birth year]  SUBJECTIVE:  Came in with increasing anxiety and tremors. Been drinking a lot since Christmastime. Admitted for alcohol withdrawal and dehydration Complains of sore throat today.  REVIEW OF SYSTEMS:   Review of Systems  Constitutional: Negative for fever, chills and weight loss.  HENT: Positive for sore throat. Negative for ear discharge, ear pain and nosebleeds.   Eyes: Negative for blurred vision, pain and discharge.  Respiratory: Negative for sputum production, shortness of breath, wheezing and stridor.   Cardiovascular: Negative for chest pain, palpitations, orthopnea and PND.  Gastrointestinal: Negative for nausea, vomiting, abdominal pain and diarrhea.  Genitourinary: Negative for urgency and frequency.  Musculoskeletal: Negative for back pain and joint pain.  Neurological: Negative for sensory change, speech change, focal weakness and weakness.  Psychiatric/Behavioral: Negative for depression and hallucinations. The patient is nervous/anxious.    Tolerating Diet:yes DRUG ALLERGIES:   Allergies  Allergen Reactions  . Benadryl [Diphenhydramine] Hives and Other (See Comments)    Reaction:  Hyperactivity   . Cephalosporins Hives  . Codeine Nausea And Vomiting    VITALS:  Blood pressure 129/58, pulse 93, temperature 98.9 F (37.2 C), temperature source Oral, resp. rate 18, height 5\' 4"  (1.626 m), weight 66.724 kg (147 lb 1.6 oz), SpO2 95 %.  PHYSICAL EXAMINATION:   Physical Exam  GENERAL:  60 y.o.-year-old patient lying in the bed with no acute distress.  EYES: Pupils equal, round, reactive to light and accommodation. No scleral icterus. Extraocular muscles intact.  HEENT: Head atraumatic, normocephalic. Oropharynx and nasopharynx clear.  NECK:  Supple, no jugular venous distention. No thyroid enlargement, no tenderness.  LUNGS:  Normal breath sounds bilaterally, no wheezing, rales, rhonchi. No use of accessory muscles of respiration.  CARDIOVASCULAR: S1, S2 normal. No murmurs, rubs, or gallops.  ABDOMEN: Soft, nontender, nondistended. Bowel sounds present. No organomegaly or mass.  EXTREMITIES: No cyanosis, clubbing or edema b/l.    NEUROLOGIC: Cranial nerves II through XII are intact. No focal Motor or sensory deficits b/l.   PSYCHIATRIC: The patient is alert and oriented x 3. Anxious,tremors+ SKIN: No obvious rash, lesion, or ulcer.   LABORATORY PANEL:  CBC  Recent Labs Lab 07/01/15 0455  WBC 9.3  HGB 12.7  HCT 37.3  PLT 152    Chemistries   Recent Labs Lab 06/30/15 1245 06/30/15 2100 07/01/15 0455  NA 134*  --  142  K 3.3*  --  3.8  CL 97*  --  114*  CO2 13*  --  24  GLUCOSE 170*  --  83  BUN 33*  --  17  CREATININE 1.26*  --  0.62  CALCIUM 8.7*  --  8.0*  MG  --  2.4  --   AST 33  --   --   ALT 23  --   --   ALKPHOS 91  --   --   BILITOT 0.5  --   --     Cardiac Enzymes No results for input(s): TROPONINI in the last 168 hours.  RADIOLOGY:  Dg Chest 1 View  06/30/2015  CLINICAL DATA:  Alcohol relapse, vomiting and shaking. History of alcohol abuse. EXAM: CHEST 1 VIEW COMPARISON:  Chest radiograph January 08, 2015 FINDINGS: Cardiomediastinal silhouette is normal. Increased lung volumes, with mild chronic interstitial changes suggest COPD. No pleural effusion or focal consolidation. No pneumothorax. Amputation distal  LEFT humerus. Soft tissue planes are nonsuspicious. IMPRESSION: Suspected COPD without superimposed acute cardiopulmonary process. Electronically Signed   By: Elon Alas M.D.   On: 06/30/2015 21:01    ASSESSMENT AND PLAN:  [name] is a 60 y.o. female with a known history of alcohol abuse. She states that she stopped drinking for 5 years prior to starting drinking over Christmas time. She states that the withdrawal is the worst she's ever had. She's been drinking a case  of beer a day and she's never been this sick.   1. Alcohol withdrawal. Cont CIWA protocol. Give IV thiamine.  -IV fluid hydration. 2. Hypokalemia- replace potassium IV. Also replace magnesium and check a magnesium. 3. Hyponatremia likely with dehydration and increased alcohol use  -improving with IVF 4. Impaired fasting glucose check a hemoglobin A1c  5. Leukocytosis- resolved Appears reactive  6. Anxiety depression- patient was put in for psych consultation from the ER. Denies suicidal ideation. Readdressed repeatedly ad pt denies any plans. She feels depressed  7. Tobacco abuse smoking cessation counseling done 3 minutes by me. Patient refused nicotine patch   Case discussed with Care Management/Social Worker. Management plans discussed with the patient, family and they are in agreement.  CODE STATUS: full  DVT Prophylaxis: loveox  TOTAL TIME TAKING CARE OF THIS PATIENT: 35 minutes.  >50% time spent on counselling and coordination of care  POSSIBLE D/C IN  1-2DAYS, DEPENDING ON CLINICAL CONDITION.  Note: This dictation was prepared with Dragon dictation along with smaller phrase technology. Any transcriptional errors that result from this process are unintentional.  Jatavius Ellenwood M.D on 07/01/2015 at 11:40 AM  Between 7am to 6pm - Pager - [phone]  After 6pm go to [email or web address] - password [email or web address] [facility]  [facility]  Office  [phone]  CC: Primary care physician; No primary care provider on file.

## 2015-07-01 NOTE — Plan of Care (Signed)
Problem: Nutrition: Goal: Adequate nutrition will be maintained Outcome: Progressing Pt report she needs to talk to the chaplin and she has suicidal thoughts but no plan. Pt CIWA has been 0 since admission to the unit. Pt denies pain. No other signs of distress noted. Will continue to monitor.

## 2015-07-01 NOTE — Progress Notes (Addendum)
Dr Fritzi Mandes- zofran 4 mg IV every 6 hours, cepacol lozenges

## 2015-07-02 DIAGNOSIS — F1994 Other psychoactive substance use, unspecified with psychoactive substance-induced mood disorder: Secondary | ICD-10-CM

## 2015-07-02 NOTE — Progress Notes (Signed)
Dr Posey Pronto- discontinue  IV fluids

## 2015-07-02 NOTE — Care Management Important Message (Signed)
Important Message  Patient Details  Name: [name] MRN: [id number] Date of Birth: Aug 18, 1955   Medicare Important Message Given:  Yes    Juliann Pulse A Kara Mierzejewski 07/02/2015, 11:32 AM

## 2015-07-02 NOTE — Evaluation (Signed)
Physical Therapy Evaluation Patient Details Name: [name] MRN: [id number] DOB: Aug 15, 1955 Today's Date: 07/02/2015   History of Present Illness  Pt is a 60 y.o. female presenting to hospital with alcohol withdrawal and dehydration (pt stopped drinking 5 years ago and recently started drinking a case of beer a day; alcohol level 221 when presenting to hospital).  PMH includes L above elbow arm amputation, L hip surgery, ETOH abuse, neuropathy, anxiety, depression.  Clinical Impression  Prior to admission, pt was modified independent without AD.  Pt lives alone on 2nd floor of home with 14-15 steps to enter with railing.  Currently pt is min to mod assist x2 with SPC to ambulate 45 feet (pt very tremulous in general and unsteady).  Pt would benefit from skilled PT to address noted impairments and functional limitations.  Recommend pt discharge to STR (pending pt's progress) when medically appropriate.     Follow Up Recommendations SNF    Equipment Recommendations   (TBD)    Recommendations for Other Services       Precautions / Restrictions Precautions Precautions: Fall Precaution Comments: CIWA Restrictions Weight Bearing Restrictions: No      Mobility  Bed Mobility Overal bed mobility: Needs Assistance Bed Mobility: Supine to Sit;Sit to Supine     Supine to sit: Supervision;HOB elevated Sit to supine: Supervision;HOB elevated      Transfers Overall transfer level: Needs assistance Equipment used: None Transfers: Sit to/from Stand Sit to Stand: Min guard;Min assist         General transfer comment: assist for balance once standing  Ambulation/Gait Ambulation/Gait assistance: Min assist;Mod assist;+2 physical assistance Ambulation Distance (Feet): 45 Feet Assistive device: Straight cane     Gait velocity interpretation: <1.8 ft/sec, indicative of risk for recurrent falls General Gait Details: decreased B step length/foot clearance/heelstrike; forward flexed  posture (pt given vc's for upright but unable to maintain upright d/t c/o dizziness and nausea); unsteady; increased effort to take individual steps  Stairs            Wheelchair Mobility    Modified Rankin (Stroke Patients Only)       Balance Overall balance assessment: Needs assistance Sitting-balance support: Bilateral upper extremity supported;Feet supported Sitting balance-Leahy Scale: Good     Standing balance support: No upper extremity supported Standing balance-Leahy Scale: Fair Standing balance comment: static standing once corrected for posterior lean                             Pertinent Vitals/Pain Pain Assessment: No/denies pain  Vitals stable and WFL throughout treatment session.    Home Living Family/patient expects to be discharged to:: Private residence Living Arrangements: Alone   Type of Home:  (2nd floor of house) Home Access: Stairs to enter   Entrance Stairs-Number of Steps: 14 with B rail from outside or 15 with L rail inside Home Layout: One [place]: None      Prior Function Level of Independence: Independent         Comments: Pt reports 1 fall in kitchen in November when her R hip "gave out"     Hand Dominance        Extremity/Trunk Assessment   Upper Extremity Assessment: Generalized weakness           Lower Extremity Assessment: Generalized weakness      Cervical / Trunk Assessment: Normal  Communication   Communication: No difficulties  Cognition Arousal/Alertness: Awake/alert Behavior  During Therapy: Anxious Overall Cognitive Status: Within Functional Limits for tasks assessed                      General Comments   Nursing cleared pt for participation in physical therapy.  Pt agreeable to PT session.    Exercises        Assessment/Plan    PT Assessment Patient needs continued PT services  PT Diagnosis Difficulty walking;Generalized weakness   PT Problem List  Decreased strength;Decreased activity tolerance;Decreased balance;Decreased mobility  PT Treatment Interventions DME instruction;Gait training;Stair training;Functional mobility training;Therapeutic activities;Therapeutic exercise;Balance training;Patient/family education   PT Goals (Current goals can be found in the Care Plan section) Acute Rehab PT Goals Patient Stated Goal: to be able to walk again PT Goal Formulation: With patient Time For Goal Achievement: 07/16/15 Potential to Achieve Goals: Good    Frequency Min 2X/week   Barriers to discharge Decreased caregiver support      Co-evaluation               End of Session Equipment Utilized During Treatment: Gait belt Activity Tolerance: Patient limited by fatigue Patient left: in bed;with call bell/phone within reach;with bed alarm set (Dr. Weber Cooks) Nurse Communication: Mobility status;Precautions         [id number] PT Time Calculation (min) (ACUTE ONLY): 23 min   Charges:   PT Evaluation $PT Eval Moderate Complexity: 1 Procedure     PT G CodesLeitha Bleak 07-28-15, 4:30 PM Leitha Bleak, [place]

## 2015-07-02 NOTE — Consult Note (Signed)
[facility] Consult   Reason for Consult:  Consult for this 60 year old woman with a history of alcohol abuse depression and anxiety currently on the medical service for withdrawal from alcohol Referring Physician:  Posey Pronto Patient Identification: [name] MRN:  [id number] Principal Diagnosis: Substance induced mood disorder [facility]) Diagnosis:   Patient Active Problem List   Diagnosis Date Noted  . Substance induced mood disorder ([place]) [F19.94] 07/02/2015  . Alcohol abuse [F10.10] 07/02/2015  . Alcohol use disorder, severe, dependence ([place]) [F10.20] 01/26/2015  . Tobacco use disorder [F17.200] 01/26/2015  . Alcohol withdrawal ([place]) [F10.239] 01/08/2015  . Amputation stump pain ([place]) [T87.89] 01/08/2015  . 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 GAD (generalized anxiety disorder) [F41.1] 12/24/2013  . Major depressive disorder, recurrent, severe without psychotic features ([place]) [F33.2] 12/22/2013  . PTSD (post-traumatic stress disorder) [F43.10] 12/22/2013  . Alcohol dependence ([place]) [F10.20] 12/21/2013    Total Time spent with patient: 1 hour  Subjective:   [name] is a 60 y.o. female patient admitted with "I feel lousy".  HPI:  Patient interviewed. Chart reviewed. Old notes reviewed. Labs and vitals and current chart reviewed. 60 year old woman with a history of alcohol abuse and depression came to the emergency room on the seventh intoxicated with a blood alcohol level over 200. Patient tells me that she had been drinking about a case of beer a day since around Christmas time. Prior to that she had supposedly been sober since last August. Denies that she's been abusing any other drugs. Patient says today she is feeling very anxious and very depressed. She feels sick to her stomach as well. They got her up to walk around with physical therapy and it was exhausting for her. Patient tells me that her mood feels down and negative  about herself and somewhat hopeless. She indicates that she has passive suicidal ideation. She claims that she's been having some visual hallucinations today. Patient is very anxious about the fact that a discharge date tomorrow has been mentioned. She feels like she needs to stay in the hospital longer than that. She says that prior to coming into the hospital she had been compliant with her medicine. She regularly takes Effexor and gabapentin. She claims that she is prescribed Klonopin and Xanax but I don't see any documentation of that.  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 Some chronic pain in the stump. Currently having some alcohol withdrawal symptoms. Does have a past history of alcohol withdrawal seizures.  Social history: Lives by herself but says that she has family that she stays in touch with pretty regularly. Indicates that she had recently gotten herself involved with a man who was drinking a lot which may have been part of her relapse.  Substance abuse history: Long history of alcohol abuse. She does have a history of alcohol withdrawal seizures and possibly DTs although that's not as clear to me. She tends to not be very compliant with outpatient recommended treatment but to just try to stop drinking on her own.  Past Psychiatric History: Patient has a history of depression and normally takes venlafaxine 300 mg a day for her depression. She has a distant history of a suicide attempt many years ago. Multiple hospitalizations for detox depression and anxiety. Most recently in the hospital psychiatrically in August 2016  Risk to Self: Suicidal Ideation: Yes-Currently Present Suicidal Intent: Yes-Currently Present Is patient at risk for suicide?:  Yes Suicidal Plan?: Yes-Currently Present Specify Current Suicidal Plan: Patient states she is going to "drink herself to death" Access to Means: Yes Specify Access to Suicidal Means: Patient has assess to  alcohol. What has been your use of drugs/alcohol within the last 12 months?: Daily How many times?: 2 Other Self Harm Risks: none Triggers for Past Attempts: None known Intentional Self Injurious Behavior: None Risk to Others: Homicidal Ideation: No Thoughts of Harm to Others: No Current Homicidal Intent: No Current Homicidal Plan: No Access to Homicidal Means: No Identified Victim: na History of harm to others?: No Assessment of Violence: None Noted Violent Behavior Description: na Does patient have access to weapons?: No Criminal Charges Pending?: No Does patient have a court date: No Prior Inpatient Therapy: Prior Inpatient Therapy: Yes Prior Therapy Dates: 2016 Prior Therapy Facilty/Provider(s): Patient can't remember Reason for Treatment: Alcohol use Prior Outpatient Therapy: Prior Outpatient Therapy: No Prior Therapy Dates: unknown Prior Therapy Facilty/Provider(s): unknown Reason for Treatment: Alcohol dependence Does patient have an ACCT team?: No Does patient have Intensive In-House Services?  : No Does patient have [facility] services? : No Does patient have P4CC services?: No  Past Medical History:  Past Medical History  Diagnosis Date  . Depression   . Anxiety   . Neuropathy ([place])   . Insomnia   . ETOH abuse     Past Surgical History  Procedure Laterality Date  . Cesarean section    . Joint replacement Right   . 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 Abdominal hysterectomy    . Right hip surgery     Family History:  Family History  Problem Relation Age of Onset  . COPD Mother   . Alcoholism Father   . COPD Father    Family Psychiatric  History: Positive for alcohol abuse in her father possibly mental health problems in her mother Social History:  History  Alcohol Use  . 14.4 oz/week  . 24 Cans of beer per week    Comment: 1 case per day     History  Drug Use No    Comment: Patient denies     Social History   Social History  . Marital Status:  Divorced    Spouse Name: N/A  . Number of Children: N/A  . Years of Education: N/A   Social History Main Topics  . Smoking status: Current Every Day Smoker -- 0.50 packs/day    Types: Cigarettes  . Smokeless tobacco: Never Used  . Alcohol Use: 14.4 oz/week    24 Cans of beer per week     Comment: 1 case per day  . Drug Use: No     Comment: Patient denies   . Sexual Activity: Yes   Other Topics Concern  . None   Social History Narrative   Additional Social History:    Pain Medications: See MAR Prescriptions: See MAR Over the Counter: See MAR History of alcohol / drug use?: Yes Longest period of sobriety (when/how long): 6 months Withdrawal Symptoms: Agitation, Sweats, DTs, Tremors, Weakness Name of Substance 1: Alcohol 1 - Age of First Use: 18 1 - Amount (size/oz): 8oz beers 1 - Frequency: daily 1 - Duration: last 2 months  1 - Last Use / Amount: 06/30/15                   Allergies:   Allergies  Allergen Reactions  . Benadryl [Diphenhydramine] Hives and Other (See Comments)    Reaction:  Hyperactivity   .  Cephalosporins Hives  . Codeine Nausea And Vomiting    Labs:  Results for orders placed or performed during the hospital encounter of 06/30/15 (from the past 48 hour(s))  Glucose, capillary     Status: None   Collection Time: 06/30/15  7:14 PM  Result Value Ref Range   Glucose-Capillary 92 65 - 99 mg/dL  Hemoglobin A1c     Status: None   Collection Time: 06/30/15  9:00 PM  Result Value Ref Range   Hgb A1c MFr Bld 5.2 4.0 - 6.0 %  Magnesium     Status: None   Collection Time: 06/30/15  9:00 PM  Result Value Ref Range   Magnesium 2.4 1.7 - 2.4 mg/dL  Basic metabolic panel     Status: Abnormal   Collection Time: 07/01/15  4:55 AM  Result Value Ref Range   Sodium 142 135 - 145 mmol/L    Comment: RESULTS VERIFIED BY REPEAT TESTING   Potassium 3.8 3.5 - 5.1 mmol/L   Chloride 114 (H) 101 - 111 mmol/L   CO2 24 22 - 32 mmol/L   Glucose, Bld 83 65 - 99  mg/dL   BUN 17 6 - 20 mg/dL   Creatinine, Ser 0.62 0.44 - 1.00 mg/dL   Calcium 8.0 (L) 8.9 - 10.3 mg/dL   GFR calc non Af Amer >60 >60 mL/min   GFR calc Af Amer >60 >60 mL/min    Comment: (NOTE) The eGFR has been calculated using the CKD EPI equation. This calculation has not been validated in all clinical situations. eGFR's persistently <60 mL/min signify possible Chronic Kidney Disease.    Anion gap 4 (L) 5 - 15  CBC     Status: None   Collection Time: 07/01/15  4:55 AM  Result Value Ref Range   WBC 9.3 3.6 - 11.0 K/uL   RBC 4.06 3.80 - 5.20 MIL/uL   Hemoglobin 12.7 12.0 - 16.0 g/dL   HCT 37.3 35.0 - 47.0 %   MCV 91.8 80.0 - 100.0 fL   MCH 31.4 26.0 - 34.0 pg   MCHC 34.2 32.0 - 36.0 g/dL   RDW 13.5 11.5 - 14.5 %   Platelets 152 150 - 440 K/uL  Culture, group A strep ([facility] only)     Status: None (Preliminary result)   Collection Time: 07/01/15  9:59 AM  Result Value Ref Range   Specimen Description THROAT    Special Requests NONE    Culture NO BETA HEMOLYTIC STREPTOCOCCI ISOLATED    Report Status PENDING   Urine Drug Screen, Qualitative ([facility] only)     Status: Abnormal   Collection Time: 07/01/15  4:13 PM  Result Value Ref Range   Tricyclic, Ur Screen NONE DETECTED NONE DETECTED   Amphetamines, Ur Screen NONE DETECTED NONE DETECTED   MDMA (Ecstasy)Ur Screen NONE DETECTED NONE DETECTED   Cocaine Metabolite,Ur  NONE DETECTED NONE DETECTED   Opiate, Ur Screen NONE DETECTED NONE DETECTED   Phencyclidine (PCP) Ur S NONE DETECTED NONE DETECTED   Cannabinoid 50 Ng, Ur  NONE DETECTED NONE DETECTED   Barbiturates, Ur Screen NONE DETECTED NONE DETECTED   Benzodiazepine, Ur Scrn POSITIVE (A) NONE DETECTED   Methadone Scn, Ur NONE DETECTED NONE DETECTED    Comment: (NOTE) 003  Tricyclics, urine               Cutoff 1000 ng/mL 200  Amphetamines, urine             Cutoff 1000 ng/mL 300  MDMA (Ecstasy), urine           Cutoff 500 ng/mL 400  Cocaine Metabolite, urine        Cutoff 300 ng/mL 500  Opiate, urine                   Cutoff 300 ng/mL 600  Phencyclidine (PCP), urine      Cutoff 25 ng/mL 700  Cannabinoid, urine              Cutoff 50 ng/mL 800  Barbiturates, urine             Cutoff 200 ng/mL 900  Benzodiazepine, urine           Cutoff 200 ng/mL 1000 Methadone, urine                Cutoff 300 ng/mL 1100 1200 The urine drug screen provides only a preliminary, unconfirmed 1300 analytical test result and should not be used for non-medical 1400 purposes. Clinical consideration and professional judgment should 1500 be applied to any positive drug screen result due to possible 1600 interfering substances. A more specific alternate chemical method 1700 must be used in order to obtain a confirmed analytical result.  1800 Gas chromato graphy / mass spectrometry (GC/MS) is the preferred 1900 confirmatory method.   Urinalysis complete, with microscopic ([facility] only)     Status: Abnormal   Collection Time: 07/01/15  4:13 PM  Result Value Ref Range   Color, Urine YELLOW (A) YELLOW   APPearance CLEAR (A) CLEAR   Glucose, UA NEGATIVE NEGATIVE mg/dL   Bilirubin Urine NEGATIVE NEGATIVE   Ketones, ur TRACE (A) NEGATIVE mg/dL   Specific Gravity, Urine 1.011 1.005 - 1.030   Hgb urine dipstick 2+ (A) NEGATIVE   pH 5.0 5.0 - 8.0   Protein, ur NEGATIVE NEGATIVE mg/dL   Nitrite NEGATIVE NEGATIVE   Leukocytes, UA NEGATIVE NEGATIVE   RBC / HPF 0-5 0 - 5 RBC/hpf   WBC, UA 0-5 0 - 5 WBC/hpf   Bacteria, UA RARE (A) NONE SEEN   Squamous Epithelial / LPF 0-5 (A) NONE SEEN   Mucous PRESENT     Current Facility-Administered Medications  Medication Dose Route Frequency Provider Last Rate Last Dose  . 0.9 %  sodium chloride infusion   Intravenous Continuous Loletha Grayer, MD 100 mL/hr at 07/02/15 [phone]    . acetaminophen (TYLENOL) tablet 650 mg  650 mg Oral Q6H PRN Loletha Grayer, MD   650 mg at 07/02/15 0851   Or  . acetaminophen (TYLENOL) suppository 650 mg  650 mg  Rectal Q6H PRN Loletha Grayer, MD      . enoxaparin (LOVENOX) injection 40 mg  40 mg Subcutaneous QHS Loletha Grayer, MD   40 mg at 93/23/55 7322  . folic acid (FOLVITE) tablet 1 mg  1 mg Oral Daily Loletha Grayer, MD   1 mg at 07/02/15 0254  . gabapentin (NEURONTIN) capsule 900 mg  900 mg Oral TID Loletha Grayer, MD   900 mg at 07/02/15 0851  . LORazepam (ATIVAN) injection 0-4 mg  0-4 mg Intravenous Q4H Loletha Grayer, MD   1 mg at 07/02/15 1212   Followed by  . LORazepam (ATIVAN) injection 0-4 mg  0-4 mg Intravenous Q12H Loletha Grayer, MD      . LORazepam (ATIVAN) tablet 1 mg  1 mg Oral Q6H PRN Loletha Grayer, MD       Or  . LORazepam (ATIVAN) injection 1 mg  1 mg Intravenous  Q6H PRN Loletha Grayer, MD      . menthol-cetylpyridinium (CEPACOL) lozenge 3 mg  1 lozenge Oral PRN Fritzi Mandes, MD      . multivitamin with minerals tablet 1 tablet  1 tablet Oral Daily Loletha Grayer, MD   1 tablet at 07/02/15 0851  . nystatin (MYCOSTATIN/NYSTOP) topical powder   Topical BID Lance Coon, MD      . ondansetron [facility]) injection 4 mg  4 mg Intravenous Q6H PRN Fritzi Mandes, MD   4 mg at 07/01/15 1945  . propranolol (INDERAL) tablet 10 mg  10 mg Oral TID Loletha Grayer, MD   10 mg at 07/02/15 0853  . sodium chloride 0.9 % injection 3 mL  3 mL Intravenous Q12H Loletha Grayer, MD   3 mL at 07/01/15 0919  . thiamine (VITAMIN B-1) tablet 100 mg  100 mg Oral Daily Loletha Grayer, MD   100 mg at 07/02/15 3790   Or  . thiamine (B-1) injection 100 mg  100 mg Intravenous Daily Loletha Grayer, MD   100 mg at 07/01/15 0916  . traZODone (DESYREL) tablet 300 mg  300 mg Oral QHS Loletha Grayer, MD   300 mg at 07/01/15 2039  . venlafaxine XR (EFFEXOR-XR) 24 hr capsule 300 mg  300 mg Oral Q breakfast Loletha Grayer, MD   300 mg at 07/02/15 2409    Musculoskeletal: Strength & Muscle Tone: decreased Gait & Station: ataxic Patient leans: N/A  Psychiatric Specialty Exam: Review of Systems  HENT:  Negative.   Eyes: Negative.   Respiratory: Negative.   Cardiovascular: Negative.   Gastrointestinal: Positive for nausea.  Musculoskeletal: Negative.   Skin: Negative.   Neurological: Positive for tremors and weakness.  Psychiatric/Behavioral: Positive for depression, suicidal ideas, hallucinations and substance abuse. Negative for memory loss. The patient is nervous/anxious and has insomnia.     Blood pressure 109/51, pulse 74, temperature 98.9 F (37.2 C), temperature source Oral, resp. rate 18, height _0  (1.626 m), weight 66.724 kg (147 lb 1.6 oz), SpO2 94 %.Body mass index is 25.24 kg/(m^2).  General Appearance: Disheveled  Eye Contact::  Minimal  Speech:  Slow  Volume:  Decreased  Mood:  Anxious  Affect:  Congruent  Thought Process:  Tangential  Orientation:  Full (Time, Place, and Person)  Thought Content:  Hallucinations: Visual  Suicidal Thoughts:  Yes.  without intent/plan  Homicidal Thoughts:  No  Memory:  Immediate;   Good Recent;   Fair Remote;   Fair  Judgement:  Fair  Insight:  Fair  Psychomotor Activity:  Decreased  Concentration:  Fair  Recall:  [employer] of Knowledge:Fair  Language: Fair  Akathisia:  No  Handed:  Right  AIMS (if indicated):     Assets:  Communication Skills Desire for Improvement Housing Resilience  ADL's:  Intact  Cognition: WNL  Sleep:      Treatment Plan Summary: Daily contact with patient to assess and evaluate symptoms and progress in treatment, Medication management and Plan 60 year old woman with alcohol abuse who is currently in the hospital mostly for management of alcohol withdrawal. Her vital signs are currently normal. She has been getting regular doses of Ativan as directed by the detox protocol. She is reporting visual hallucinations but did not seem to be delirious or particularly confused during our conversation. She does report anxiety and depression and is very upset about the idea of being discharged tomorrow. She  started endorsing suicidal thoughts when I specifically ask about it but hasn't  shown any inclination to act out in a suicidal way. She tells me that if she goes home that she is certain that she will simply start drinking again. Doesn't want to participate in any conversation about other options. Asian seems to just want to stay in the hospital a little bit longer. I will follow up with her tomorrow and see if she is still voicing suicidal ideation and if it looks like it would be of any benefit to admit her to psychiatry. Meanwhile she is already on her venlafaxine and gabapentin. I would not put her on standing doses of benzodiazepines. Continue detox protocol. I will reassess tomorrow.  Disposition: Supportive therapy provided about ongoing stressors. Discussed crisis plan, support from social network, calling 911, coming to the Emergency Department, and calling Suicide Hotline.  John Clapacs 07/02/2015 4:19 PM

## 2015-07-02 NOTE — Progress Notes (Signed)
[facility] [place] NAME: [name]    MR#:  [id number]  DATE OF BIRTH:  Mar 14, 1956  SUBJECTIVE:  Came in with increasing anxiety and tremors. Been drinking a lot since Christmas time.  Feels weak. Eating some  REVIEW OF SYSTEMS:   Review of Systems  Constitutional: Negative for fever, chills and weight loss.  HENT: Positive for sore throat. Negative for ear discharge, ear pain and nosebleeds.   Eyes: Negative for blurred vision, pain and discharge.  Respiratory: Negative for sputum production, shortness of breath, wheezing and stridor.   Cardiovascular: Negative for chest pain, palpitations, orthopnea and PND.  Gastrointestinal: Negative for nausea, vomiting, abdominal pain and diarrhea.  Genitourinary: Negative for urgency and frequency.  Musculoskeletal: Negative for back pain and joint pain.  Neurological: Negative for sensory change, speech change, focal weakness and weakness.  Psychiatric/Behavioral: Negative for depression and hallucinations. The patient is nervous/anxious.    Tolerating Diet:yes DRUG ALLERGIES:   Allergies  Allergen Reactions  . Benadryl [Diphenhydramine] Hives and Other (See Comments)    Reaction:  Hyperactivity   . Cephalosporins Hives  . Codeine Nausea And Vomiting    VITALS:  Blood pressure 109/51, pulse 74, temperature 98.9 F (37.2 C), temperature source Oral, resp. rate 18, height 5\' 4"  (1.626 m), weight 66.724 kg (147 lb 1.6 oz), SpO2 94 %.  PHYSICAL EXAMINATION:   Physical Exam  GENERAL:  60 y.o.-year-old patient lying in the bed with no acute distress.  EYES: Pupils equal, round, reactive to light and accommodation. No scleral icterus. Extraocular muscles intact.  HEENT: Head atraumatic, normocephalic. Oropharynx and nasopharynx clear.  NECK:  Supple, no jugular venous distention. No thyroid enlargement, no tenderness.  LUNGS: Normal breath sounds bilaterally, no wheezing, rales,  rhonchi. No use of accessory muscles of respiration.  CARDIOVASCULAR: S1, S2 normal. No murmurs, rubs, or gallops.  ABDOMEN: Soft, nontender, nondistended. Bowel sounds present. No organomegaly or mass.  EXTREMITIES: No cyanosis, clubbing or edema b/l.    NEUROLOGIC: Cranial nerves II through XII are intact. No focal Motor or sensory deficits b/l.   PSYCHIATRIC: The patient is alert and oriented x 3. Anxious,tremors+ SKIN: No obvious rash, lesion, or ulcer.   LABORATORY PANEL:  CBC  Recent Labs Lab 07/01/15 0455  WBC 9.3  HGB 12.7  HCT 37.3  PLT 152    Chemistries   Recent Labs Lab 06/30/15 1245 06/30/15 2100 07/01/15 0455  NA 134*  --  142  K 3.3*  --  3.8  CL 97*  --  114*  CO2 13*  --  24  GLUCOSE 170*  --  83  BUN 33*  --  17  CREATININE 1.26*  --  0.62  CALCIUM 8.7*  --  8.0*  MG  --  2.4  --   AST 33  --   --   ALT 23  --   --   ALKPHOS 91  --   --   BILITOT 0.5  --   --     Cardiac Enzymes No results for input(s): TROPONINI in the last 168 hours.  RADIOLOGY:  Dg Chest 1 View  06/30/2015  CLINICAL DATA:  Alcohol relapse, vomiting and shaking. History of alcohol abuse. EXAM: CHEST 1 VIEW COMPARISON:  Chest radiograph January 08, 2015 FINDINGS: Cardiomediastinal silhouette is normal. Increased lung volumes, with mild chronic interstitial changes suggest COPD. No pleural effusion or focal consolidation. No pneumothorax. Amputation distal LEFT humerus. Soft tissue planes  are nonsuspicious. IMPRESSION: Suspected COPD without superimposed acute cardiopulmonary process. Electronically Signed   By: Elon Alas M.D.   On: 06/30/2015 21:01    ASSESSMENT AND PLAN:  [name] is a 60 y.o. female with a known history of alcohol abuse. She states that she stopped drinking for 5 years prior to starting drinking over Christmas time. She states that the withdrawal is the worst she's ever had. She's been drinking a case of beer a day and she's never been this sick.   1.  Alcohol withdrawal. Cont CIWA protocol. Give IV thiamine.  -IV fluid hydration.  2. Hypokalemia- replace potassium IV. Also replace magnesium and check a magnesium.  3. Hyponatremia likely with dehydration and increased alcohol use improving with IVF  4. Impaired fasting glucose  hemoglobin A1c is 5.2  5. Leukocytosis- resolved Appears reactive  6. Anxiety depression- patient was put in for psych consultation from the ER. Denies suicidal ideation. Readdressed repeatedly ad pt denies any plans. She feels depressed  7. Tobacco abuse smoking cessation counseling done 3 minutes by me. Patient refused nicotine patch  PT recommends rehab. CSW for rehab Case discussed with Care Management/Social Worker. Management plans discussed with the patient, family and they are in agreement.  CODE STATUS: full  DVT Prophylaxis: loveox  TOTAL TIME TAKING CARE OF THIS PATIENT: 35 minutes.  >50% time spent on counselling and coordination of care  POSSIBLE D/C IN  1-2DAYS, DEPENDING ON CLINICAL CONDITION.  Note: This dictation was prepared with Dragon dictation along with smaller phrase technology. Any transcriptional errors that result from this process are unintentional.  Nessa Ramaker M.D on 07/02/2015 at 4:37 PM  Between 7am to 6pm - Pager - [phone]  After 6pm go to [email or web address] - password [email or web address] [facility]  [facility]  Office  [phone]  CC: Primary care physician; No primary care provider on file.

## 2015-07-03 ENCOUNTER — Inpatient Hospital Stay
Admission: EM | Admit: 2015-07-03 | Discharge: 2015-07-09 | DRG: 885 | Disposition: A | Discharge status: Skilled Nursing Facility | Payer: Medicare Other | Source: Intra-hospital | Attending: Psychiatry | Admitting: Psychiatry

## 2015-07-03 DIAGNOSIS — Y907 Blood alcohol level of 200-239 mg/100 ml: Secondary | ICD-10-CM | POA: Diagnosis not present

## 2015-07-03 DIAGNOSIS — F17210 Nicotine dependence, cigarettes, uncomplicated: Secondary | ICD-10-CM | POA: Diagnosis not present

## 2015-07-03 DIAGNOSIS — Z888 Allergy status to other drugs, medicaments and biological substances status: Secondary | ICD-10-CM | POA: Diagnosis not present

## 2015-07-03 DIAGNOSIS — Z638 Other specified problems related to primary support group: Secondary | ICD-10-CM

## 2015-07-03 DIAGNOSIS — F3164 Bipolar disorder, current episode mixed, severe, with psychotic features: Secondary | ICD-10-CM

## 2015-07-03 DIAGNOSIS — Z811 Family history of alcohol abuse and dependence: Secondary | ICD-10-CM

## 2015-07-03 DIAGNOSIS — F1020 Alcohol dependence, uncomplicated: Secondary | ICD-10-CM | POA: Diagnosis present

## 2015-07-03 DIAGNOSIS — Z886 Allergy status to analgesic agent status: Secondary | ICD-10-CM

## 2015-07-03 DIAGNOSIS — F10939 Alcohol use, unspecified with withdrawal, unspecified: Secondary | ICD-10-CM | POA: Diagnosis present

## 2015-07-03 DIAGNOSIS — J029 Acute pharyngitis, unspecified: Secondary | ICD-10-CM | POA: Diagnosis present

## 2015-07-03 DIAGNOSIS — G629 Polyneuropathy, unspecified: Secondary | ICD-10-CM | POA: Diagnosis present

## 2015-07-03 DIAGNOSIS — G8929 Other chronic pain: Secondary | ICD-10-CM | POA: Diagnosis present

## 2015-07-03 DIAGNOSIS — Z90710 Acquired absence of both cervix and uterus: Secondary | ICD-10-CM | POA: Diagnosis not present

## 2015-07-03 DIAGNOSIS — Z23 Encounter for immunization: Secondary | ICD-10-CM

## 2015-07-03 DIAGNOSIS — F332 Major depressive disorder, recurrent severe without psychotic features: Principal | ICD-10-CM | POA: Diagnosis present

## 2015-07-03 DIAGNOSIS — Z9660 Presence of unspecified orthopedic joint implant: Secondary | ICD-10-CM | POA: Diagnosis not present

## 2015-07-03 DIAGNOSIS — F4310 Post-traumatic stress disorder, unspecified: Secondary | ICD-10-CM | POA: Diagnosis not present

## 2015-07-03 DIAGNOSIS — G4700 Insomnia, unspecified: Secondary | ICD-10-CM | POA: Diagnosis not present

## 2015-07-03 DIAGNOSIS — E870 Hyperosmolality and hypernatremia: Secondary | ICD-10-CM | POA: Diagnosis present

## 2015-07-03 DIAGNOSIS — Z825 Family history of asthma and other chronic lower respiratory diseases: Secondary | ICD-10-CM | POA: Diagnosis not present

## 2015-07-03 DIAGNOSIS — R45851 Suicidal ideations: Secondary | ICD-10-CM | POA: Diagnosis not present

## 2015-07-03 DIAGNOSIS — F10239 Alcohol dependence with withdrawal, unspecified: Secondary | ICD-10-CM | POA: Diagnosis not present

## 2015-07-03 DIAGNOSIS — S48112A Complete traumatic amputation at level between left shoulder and elbow, initial encounter: Secondary | ICD-10-CM

## 2015-07-03 DIAGNOSIS — F1994 Other psychoactive substance use, unspecified with psychoactive substance-induced mood disorder: Secondary | ICD-10-CM | POA: Diagnosis not present

## 2015-07-03 DIAGNOSIS — F17200 Nicotine dependence, unspecified, uncomplicated: Secondary | ICD-10-CM | POA: Diagnosis present

## 2015-07-03 DIAGNOSIS — Z89222 Acquired absence of left upper limb above elbow: Secondary | ICD-10-CM

## 2015-07-03 DIAGNOSIS — F411 Generalized anxiety disorder: Secondary | ICD-10-CM | POA: Diagnosis present

## 2015-07-03 LAB — CBC WITH DIFFERENTIAL/PLATELET
BASOS ABS: 0 10*3/uL (ref 0–0.1)
Basophils Relative: 1 %
Eosinophils Absolute: 0.2 10*3/uL (ref 0–0.7)
Eosinophils Relative: 3 %
HEMATOCRIT: 34.6 % — AB (ref 35.0–47.0)
Hemoglobin: 11.8 g/dL — ABNORMAL LOW (ref 12.0–16.0)
LYMPHS PCT: 40 %
Lymphs Abs: 2.3 10*3/uL (ref 1.0–3.6)
MCH: 31.7 pg (ref 26.0–34.0)
MCHC: 34.2 g/dL (ref 32.0–36.0)
MCV: 92.8 fL (ref 80.0–100.0)
MONO ABS: 0.4 10*3/uL (ref 0.2–0.9)
Monocytes Relative: 7 %
NEUTROS ABS: 2.9 10*3/uL (ref 1.4–6.5)
Neutrophils Relative %: 49 %
Platelets: 125 10*3/uL — ABNORMAL LOW (ref 150–440)
RBC: 3.73 MIL/uL — AB (ref 3.80–5.20)
RDW: 13.6 % (ref 11.5–14.5)
WBC: 5.8 10*3/uL (ref 3.6–11.0)

## 2015-07-03 LAB — CULTURE, GROUP A STREP (THRC)

## 2015-07-03 MED ORDER — FOLIC ACID 1 MG PO TABS: 1.0000 mg | ORAL_TABLET | Freq: Every day | ORAL | Status: DC

## 2015-07-03 MED ORDER — ADULT MULTIVITAMIN W/MINERALS CH: 1.0000 | ORAL_TABLET | Freq: Every day | ORAL | Status: DC

## 2015-07-03 MED ORDER — FOLIC ACID 1 MG PO TABS
1.0000 mg | ORAL_TABLET | Freq: Every day | ORAL | Status: DC
  Administered 2015-07-04: 1 mg via ORAL
  Filled 2015-07-03: qty 1

## 2015-07-03 MED ORDER — MAGNESIUM HYDROXIDE 400 MG/5ML PO SUSP: 30.0000 mL | Freq: Every day | ORAL | Status: DC | PRN

## 2015-07-03 MED ORDER — ACETAMINOPHEN 325 MG PO TABS
650.0000 mg | ORAL_TABLET | Freq: Four times a day (QID) | ORAL | Status: DC | PRN
  Administered 2015-07-06 – 2015-07-09 (×3): 650 mg via ORAL
  Filled 2015-07-03 (×3): qty 2

## 2015-07-03 MED ORDER — NYSTATIN 100000 UNIT/GM EX POWD
Freq: Two times a day (BID) | CUTANEOUS | Status: DC
  Administered 2015-07-04 – 2015-07-09 (×8): via TOPICAL
  Filled 2015-07-03: qty 15

## 2015-07-03 MED ORDER — GABAPENTIN 300 MG PO CAPS
900.0000 mg | ORAL_CAPSULE | Freq: Three times a day (TID) | ORAL | Status: DC
  Administered 2015-07-04 – 2015-07-05 (×4): 900 mg via ORAL
  Filled 2015-07-03 (×4): qty 3

## 2015-07-03 MED ORDER — VENLAFAXINE HCL ER 75 MG PO CP24
300.0000 mg | ORAL_CAPSULE | Freq: Every day | ORAL | Status: DC
  Administered 2015-07-04 – 2015-07-09 (×6): 300 mg via ORAL
  Filled 2015-07-03 (×6): qty 4

## 2015-07-03 MED ORDER — PROPRANOLOL HCL 10 MG PO TABS
10.0000 mg | ORAL_TABLET | Freq: Three times a day (TID) | ORAL | Status: DC
Start: 2015-07-04 — End: 2015-07-05
  Administered 2015-07-04 – 2015-07-05 (×4): 10 mg via ORAL
  Filled 2015-07-03 (×6): qty 1

## 2015-07-03 MED ORDER — CHLORDIAZEPOXIDE HCL 25 MG PO CAPS
25.0000 mg | ORAL_CAPSULE | Freq: Two times a day (BID) | ORAL | Status: DC
  Administered 2015-07-03 (×2): 25 mg via ORAL
  Filled 2015-07-03 (×2): qty 1

## 2015-07-03 MED ORDER — CHLORDIAZEPOXIDE HCL 25 MG PO CAPS: 25.0000 mg | ORAL_CAPSULE | Freq: Two times a day (BID) | ORAL | Status: DC

## 2015-07-03 MED ORDER — TRAZODONE HCL 100 MG PO TABS
300.0000 mg | ORAL_TABLET | Freq: Every day | ORAL | Status: DC
  Administered 2015-07-04: 300 mg via ORAL
  Filled 2015-07-03: qty 3

## 2015-07-03 MED ORDER — CHLORDIAZEPOXIDE HCL 25 MG PO CAPS
25.0000 mg | ORAL_CAPSULE | Freq: Four times a day (QID) | ORAL | Status: DC
  Administered 2015-07-04 (×2): 25 mg via ORAL
  Filled 2015-07-03 (×4): qty 1

## 2015-07-03 MED ORDER — MENTHOL 3 MG MT LOZG
1.0000 | LOZENGE | OROMUCOSAL | Status: DC | PRN
  Administered 2015-07-04 – 2015-07-07 (×3): 3 mg via ORAL
  Filled 2015-07-03 (×2): qty 9

## 2015-07-03 MED ORDER — ADULT MULTIVITAMIN W/MINERALS CH
1.0000 | ORAL_TABLET | Freq: Every day | ORAL | Status: DC
  Administered 2015-07-04 – 2015-07-09 (×6): 1 via ORAL
  Filled 2015-07-03 (×6): qty 1

## 2015-07-03 MED ORDER — ALUM & MAG HYDROXIDE-SIMETH 200-200-20 MG/5ML PO SUSP
30.0000 mL | ORAL | Status: DC | PRN
Start: 2015-07-03 — End: 2015-07-09

## 2015-07-03 MED ORDER — VITAMIN B-1 50 MG PO TABS: 50.0000 mg | ORAL_TABLET | Freq: Every day | ORAL | Status: DC

## 2015-07-03 MED ORDER — LORAZEPAM 1 MG PO TABS
1.0000 mg | ORAL_TABLET | Freq: Once | ORAL | Status: AC
  Administered 2015-07-03: 17:00:00 1 mg via ORAL
  Filled 2015-07-03: qty 1

## 2015-07-03 NOTE — Progress Notes (Signed)
Patient discharged to Behavioral med, report given to [place], Therapist, sports. Discharge instructions explained to Patient. Patient verbalizes understanding of discharge instructions.  Key to patients valuables sent in packet to behavioral med with patient.

## 2015-07-03 NOTE — Progress Notes (Signed)
   07/03/15 1500  Clinical Encounter Type  Visited With Patient not available;Health care provider;Other (Comment)  Visit Type Spiritual support  Referral From Nurse  Consult/Referral To Chaplain  Chaplain received a page from the nurse stating that patient would appreciate a visit. When chaplain arrived patient was unavailable as the psychiatrist was visiting her. Chaplain Briselda Naval A. Thelonious Kauffmann Ext. [phone]

## 2015-07-03 NOTE — NC FL2 (Signed)
[place] LEVEL OF CARE SCREENING TOOL     IDENTIFICATION  Patient Name: [name] Birthdate: 02-15-1956 Sex: female Admission Date (Current Location): 06/30/2015  [place] and [place] Number:  Engineering geologist and Address:  [facility], [address] [place], [place] [address]      Provider Number: [id number]  Attending Physician Name and Address:  Fritzi Mandes, MD  Relative Name and Phone Number:       Current Level of Care: Hospital Recommended Level of Care: [place] Prior Approval Number:    Date Approved/Denied:   PASRR Number: [id number]  Discharge Plan: SNF    Current Diagnoses: Patient Active Problem List   Diagnosis Date Noted  . Substance induced mood disorder ([place]) 07/02/2015  . Alcohol abuse 07/02/2015  . Alcohol use disorder, severe, dependence ([place]) 01/26/2015  . Tobacco use disorder 01/26/2015  . Alcohol withdrawal ([place]) 01/08/2015  . Amputation stump pain ([place]) 01/08/2015  . 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 GAD (generalized anxiety disorder) 12/24/2013  . Major depressive disorder, recurrent, severe without psychotic features ([place]) 12/22/2013  . PTSD (post-traumatic stress disorder) 12/22/2013  . Alcohol dependence ([place]) 12/21/2013    Orientation RESPIRATION BLADDER Height & Weight    Self, Time, Situation, Place  Normal Continent 5\' 4"  (162.6 cm) 147 lbs.  BEHAVIORAL SYMPTOMS/MOOD NEUROLOGICAL BOWEL NUTRITION STATUS      Continent Diet (Regular)  AMBULATORY STATUS COMMUNICATION OF NEEDS Skin   Limited Assist Verbally Normal                       Personal Care Assistance Level of Assistance              Functional Limitations Info  Sight, Hearing, Speech Sight Info: Adequate Hearing Info: Adequate Speech Info: Adequate    SPECIAL CARE FACTORS FREQUENCY  PT (By licensed PT)     PT Frequency: 5              Contractures       Additional Factors Info  Allergies, Code Status Code Status Info: Full Code Allergies Info: Allergies: Benadryl, Cephalosporins, Codeine           Current Medications (07/03/2015):  This is the current hospital active medication list Current Facility-Administered Medications  Medication Dose Route Frequency Provider Last Rate Last Dose  . acetaminophen (TYLENOL) tablet 650 mg  650 mg Oral Q6H PRN Loletha Grayer, MD   650 mg at 07/02/15 0851   Or  . acetaminophen (TYLENOL) suppository 650 mg  650 mg Rectal Q6H PRN Loletha Grayer, MD      . chlordiazePOXIDE (LIBRIUM) capsule 25 mg  25 mg Oral BID Fritzi Mandes, MD   25 mg at 07/03/15 1333  . folic acid (FOLVITE) tablet 1 mg  1 mg Oral Daily Loletha Grayer, MD   1 mg at 07/03/15 0939  . gabapentin (NEURONTIN) capsule 900 mg  900 mg Oral TID Loletha Grayer, MD   900 mg at 07/03/15 0939  . LORazepam (ATIVAN) injection 0-4 mg  0-4 mg Intravenous Q12H Loletha Grayer, MD   2 mg at 07/03/15 [id number]  . LORazepam (ATIVAN) tablet 1 mg  1 mg Oral Q6H PRN Loletha Grayer, MD   1 mg at 07/03/15 1333   Or  . LORazepam (ATIVAN) injection 1 mg  1 mg Intravenous Q6H PRN Loletha Grayer, MD      . menthol-cetylpyridinium (CEPACOL) lozenge 3 mg  1 lozenge Oral PRN Fritzi Mandes, MD      . multivitamin with minerals tablet 1 tablet  1 tablet Oral Daily Loletha Grayer, MD   1 tablet at 07/03/15 [phone]  . nystatin (MYCOSTATIN/NYSTOP) topical powder   Topical BID Lance Coon, MD      . ondansetron [facility]) injection 4 mg  4 mg Intravenous Q6H PRN Fritzi Mandes, MD   4 mg at 07/02/15 1824  . propranolol (INDERAL) tablet 10 mg  10 mg Oral TID Loletha Grayer, MD   10 mg at 07/03/15 0939  . sodium chloride 0.9 % injection 3 mL  3 mL Intravenous Q12H Loletha Grayer, MD   3 mL at 07/03/15 0941  . thiamine (VITAMIN B-1) tablet 100 mg  100 mg Oral Daily Loletha Grayer, MD   100 mg at 07/03/15 [id number]   Or  . thiamine (B-1) injection 100 mg  100 mg Intravenous Daily  Loletha Grayer, MD   100 mg at 07/01/15 0916  . traZODone (DESYREL) tablet 300 mg  300 mg Oral QHS Loletha Grayer, MD   300 mg at 07/02/15 2239  . venlafaxine XR (EFFEXOR-XR) 24 hr capsule 300 mg  300 mg Oral Q breakfast Loletha Grayer, MD   300 mg at 07/03/15 [id number]     Discharge Medications: Please see discharge summary for a list of discharge medications.  Relevant Imaging Results:  Relevant Lab Results:   Additional Information SSN:  [id number]  Darden Dates, LCSW

## 2015-07-03 NOTE — Plan of Care (Signed)
Problem: Safety: Goal: Ability to remain free from injury will improve Outcome: Progressing On high fall precaution per policy. Ativan IV given x1 per CIWA score.  Problem: Pain Managment: Goal: General experience of comfort will improve Outcome: Progressing No complaints of pain.

## 2015-07-03 NOTE — Progress Notes (Signed)
Patient is to be admitted to [facility] by Dr. Weber Cooks.  Attending Physician will be Dr. Jerilee Hoh.   Patient has been assigned to room 319, by [facility] Charge Nurse Anderson Malta.   Intake Paper Work has been signed and placed on patient chart.  Please transfer the pt after 7pm. Call report to [phone]   07/03/2015 [name], MS, [place], LPCA Therapeutic Triage Specialist

## 2015-07-03 NOTE — Plan of Care (Signed)
Problem: Safety: Goal: Ability to remain free from injury will improve Outcome: Progressing Pt  Free from injury .  Assisted to bsc to void.  Pretty steady on feet   Problem: Spiritual Needs Goal: Ability to function at adequate level Outcome: Progressing Pt became very upset and agitated  And trembling  This pm and  In tears.pt said she was not getting her meds like she take at home.  Explained to pt how they were ordered Here but  Would call md to see about getting them the way she took at home. Dr patel notified. Dr clapacs in to see pt  This pm and pt to go to beh med instead of skilled facility.

## 2015-07-03 NOTE — Clinical Social Work Note (Signed)
Clinical Social Work Assessment  Patient Details  Name: [name] MRN: [id number] Date of Birth: 06/19/1956  Date of referral:  07/03/15               Reason for consult:  Facility Placement                Permission sought to share information with:  Family Supports Permission granted to share information::  Yes, Verbal Permission Granted  Name::     daughter   Housing/Transportation Living arrangements for the past 2 months:  [place] of Information:  Patient Patient Interpreter Needed:  None Criminal Activity/Legal Involvement Pertinent to Current Situation/Hospitalization:  No - Comment as needed Significant Relationships:  Adult Children Lives with:  Self Do you feel safe going back to the place where you live?  No Need for family participation in patient care:  No (Coment)  Care giving concerns:  Pt lives alone.    Social Worker assessment / plan:  CSW met with pt to address consult for Substance Abuse and New SNF. CSW introduced herself and explained role of social work. CSW also explained process of discharging to SNF. Pt is agreeable to SNF as recommended by PT. Pt reported not having transportation.   Pt endorses heavy ETOH use, a 24-pack a day. Pt reported that a friend provides her with the ETOH. Pt also reported that her daughter has not spoken to her while she is in the hospital due to the nature of her admission (ETOH detox). Pt reported that is involved with [facility].   CSW provided bed offers, and pt chose [facility]. Pt was ready for discharge today. However, pt was seen by psychiatrist. Pt will be admitted to BMU this evening for further management as pt is very anxious about discharging to SNF, per Dr. Weber Cooks. CSW notified Hospitalist. RN spoke with MD and EMS. CSW updated facility.   Pt will discharge to BMU. RN is following up with MD regarding this matter. CSW is signing off as no further needs identified.    Employment status:   Disabled (Comment on whether or not currently receiving Disability) Insurance information:  Medicare, Medicaid In [place] PT Recommendations:  [place] / Referral to community resources:  [place]  Patient/Family's Response to care:  Pt was appreciative of CSW support.   Patient/Family's Understanding of and Emotional Response to Diagnosis, Current Treatment, and Prognosis:  Pt would benefit from inpatient psych treatment.   Emotional Assessment Appearance:  Appears stated age Attitude/Demeanor/Rapport:  Avoidant Affect (typically observed):  Anxious Orientation:  Oriented to Self, Oriented to Place, Oriented to  Time, Oriented to Situation Alcohol / Substance use:  Alcohol Use Psych involvement (Current and /or in the community):  Yes (Comment)  Discharge Needs  Concerns to be addressed:  Adjustment to Illness, Substance Abuse Concerns Readmission within the last 30 days:  No Current discharge risk:  Psychiatric Illness Barriers to Discharge:  No Barriers Identified   Darden Dates, LCSW 07/03/2015, 5:36 PM

## 2015-07-03 NOTE — Consult Note (Signed)
[place] Psychiatry Consult   Reason for Consult:  Follow-up consult for this 60 year old woman with a history of severe recurrent depression, chronic anxiety with anxiety attacks, alcohol abuse and withdrawal, PTSD. Referring Physician:  Posey Pronto Patient Identification: [name] MRN:  [id number] Principal Diagnosis: Substance induced mood disorder [facility]) Diagnosis:   Patient Active Problem List   Diagnosis Date Noted  . Substance induced mood disorder ([place]) [F19.94] 07/02/2015  . Alcohol abuse [F10.10] 07/02/2015  . Alcohol use disorder, severe, dependence ([place]) [F10.20] 01/26/2015  . Tobacco use disorder [F17.200] 01/26/2015  . Alcohol withdrawal ([place]) [F10.239] 01/08/2015  . Amputation stump pain ([place]) [T87.89] 01/08/2015  . 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 GAD (generalized anxiety disorder) [F41.1] 12/24/2013  . Major depressive disorder, recurrent, severe without psychotic features ([place]) [F33.2] 12/22/2013  . PTSD (post-traumatic stress disorder) [F43.10] 12/22/2013  . Alcohol dependence ([place]) [F10.20] 12/21/2013    Total Time spent with patient: 45 minutes  Subjective:   [name] is a 60 y.o. female patient admitted with "I just cannot go on like this".  HPI:  Follow-up for this 60 year old woman with chronic depression and anxiety and substance abuse well known to the psychiatry service. I had seen her yesterday on the medical service where she is being treated for alcohol withdrawal. The medical service and felt that today she was stable enough that they were planning to discharge her to rehabilitation. Her vital signs been stable. She does not appear to be delirious. However, my interview today she says that her anxiety is out of control. She is feeling overwhelmed by nervousness and worry. Mood is feeling down and sad and negative. Sleep is poor. She has multiple somatic symptoms including abdominal pain and difficulty  walking and tremor. She also is having suicidal thoughts with thoughts that she will try to hurt her self intentionally because of her anxiety. Does not feel like the disposition plan is adequate despite the fact that she is back on her usual outpatient medicines.  Social history: Major social problems currently she is in between places to stay. Doesn't really have a direct place to go right now. Has family but is somewhat estranged from most of them.  Medical history: Chronic pain from her amputation, alcohol withdrawal with a past history of alcohol withdrawal seizures.  Substance abuse history: Long history of alcohol abuse with alcohol withdrawal seizures possible DTs. Long history of excessive use of benzodiazepines with withdrawal problems at times as well.  Past Psychiatric History: Patient has had multiple psychiatric admissions to our unit in going back over 10 years. Has been in the unit as recently as this sought him. Does have a past history of some self injury and suicide attempts in the distant past. Has responded well to medication but also has a history of noncompliance  Risk to Self: Suicidal Ideation: Yes-Currently Present Suicidal Intent: Yes-Currently Present Is patient at risk for suicide?: Yes Suicidal Plan?: Yes-Currently Present Specify Current Suicidal Plan: Patient states she is going to "drink herself to death" Access to Means: Yes Specify Access to Suicidal Means: Patient has assess to alcohol. What has been your use of drugs/alcohol within the last 12 months?: Daily How many times?: 2 Other Self Harm Risks: none Triggers for Past Attempts: None known Intentional Self Injurious Behavior: None Risk to Others: Homicidal Ideation: No Thoughts of Harm to Others: No Current Homicidal Intent: No Current Homicidal Plan: No Access to Homicidal Means: No Identified Victim:  na History of harm to others?: No Assessment of Violence: None Noted Violent Behavior  Description: na Does patient have access to weapons?: No Criminal Charges Pending?: No Does patient have a court date: No Prior Inpatient Therapy: Prior Inpatient Therapy: Yes Prior Therapy Dates: 2016 Prior Therapy Facilty/Provider(s): Patient can't remember Reason for Treatment: Alcohol use Prior Outpatient Therapy: Prior Outpatient Therapy: No Prior Therapy Dates: unknown Prior Therapy Facilty/Provider(s): unknown Reason for Treatment: Alcohol dependence Does patient have an ACCT team?: No Does patient have Intensive In-House Services?  : No Does patient have [facility] services? : No Does patient have P4CC services?: No  Past Medical History:  Past Medical History  Diagnosis Date  . Depression   . Anxiety   . Neuropathy ([place])   . Insomnia   . ETOH abuse     Past Surgical History  Procedure Laterality Date  . Cesarean section    . Joint replacement Right   . 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 Abdominal hysterectomy    . Right hip surgery     Family History:  Family History  Problem Relation Age of Onset  . COPD Mother   . Alcoholism Father   . COPD Father    Family Psychiatric  History: Positive for anxiety and alcohol Social History:  History  Alcohol Use  . 14.4 oz/week  . 24 Cans of beer per week    Comment: 1 case per day     History  Drug Use No    Comment: Patient denies     Social History   Social History  . Marital Status: Divorced    Spouse Name: N/A  . Number of Children: N/A  . Years of Education: N/A   Social History Main Topics  . Smoking status: Current Every Day Smoker -- 0.50 packs/day    Types: Cigarettes  . Smokeless tobacco: Never Used  . Alcohol Use: 14.4 oz/week    24 Cans of beer per week     Comment: 1 case per day  . Drug Use: No     Comment: Patient denies   . Sexual Activity: Yes   Other Topics Concern  . None   Social History Narrative   Additional Social History:    Pain Medications: See MAR Prescriptions:  See MAR Over the Counter: See MAR History of alcohol / drug use?: Yes Longest period of sobriety (when/how long): 6 months Withdrawal Symptoms: Agitation, Sweats, DTs, Tremors, Weakness Name of Substance 1: Alcohol 1 - Age of First Use: 18 1 - Amount (size/oz): 8oz beers 1 - Frequency: daily 1 - Duration: last 2 months  1 - Last Use / Amount: 06/30/15                   Allergies:   Allergies  Allergen Reactions  . Benadryl [Diphenhydramine] Hives and Other (See Comments)    Reaction:  Hyperactivity   . Cephalosporins Hives  . Codeine Nausea And Vomiting    Labs:  Results for orders placed or performed during the hospital encounter of 06/30/15 (from the past 48 hour(s))  CBC with Differential/Platelet     Status: Abnormal   Collection Time: 07/03/15  5:45 AM  Result Value Ref Range   WBC 5.8 3.6 - 11.0 K/uL   RBC 3.73 (L) 3.80 - 5.20 MIL/uL   Hemoglobin 11.8 (L) 12.0 - 16.0 g/dL   HCT 34.6 (L) 35.0 - 47.0 %   MCV 92.8 80.0 - 100.0 fL   MCH  31.7 26.0 - 34.0 pg   MCHC 34.2 32.0 - 36.0 g/dL   RDW 13.6 11.5 - 14.5 %   Platelets 125 (L) 150 - 440 K/uL   Neutrophils Relative % 49 %   Neutro Abs 2.9 1.4 - 6.5 K/uL   Lymphocytes Relative 40 %   Lymphs Abs 2.3 1.0 - 3.6 K/uL   Monocytes Relative 7 %   Monocytes Absolute 0.4 0.2 - 0.9 K/uL   Eosinophils Relative 3 %   Eosinophils Absolute 0.2 0 - 0.7 K/uL   Basophils Relative 1 %   Basophils Absolute 0.0 0 - 0.1 K/uL    Current Facility-Administered Medications  Medication Dose Route Frequency Provider Last Rate Last Dose  . acetaminophen (TYLENOL) tablet 650 mg  650 mg Oral Q6H PRN Loletha Grayer, MD   650 mg at 07/02/15 0851   Or  . acetaminophen (TYLENOL) suppository 650 mg  650 mg Rectal Q6H PRN Loletha Grayer, MD      . chlordiazePOXIDE (LIBRIUM) capsule 25 mg  25 mg Oral BID Fritzi Mandes, MD   25 mg at 07/03/15 1333  . folic acid (FOLVITE) tablet 1 mg  1 mg Oral Daily Loletha Grayer, MD   1 mg at 07/03/15 0939   . gabapentin (NEURONTIN) capsule 900 mg  900 mg Oral TID Loletha Grayer, MD   900 mg at 07/03/15 1540  . LORazepam (ATIVAN) injection 0-4 mg  0-4 mg Intravenous Q12H Loletha Grayer, MD   2 mg at 07/03/15 [id number]  . LORazepam (ATIVAN) tablet 1 mg  1 mg Oral Q6H PRN Loletha Grayer, MD   1 mg at 07/03/15 1333   Or  . LORazepam (ATIVAN) injection 1 mg  1 mg Intravenous Q6H PRN Loletha Grayer, MD      . LORazepam (ATIVAN) tablet 1 mg  1 mg Oral Once Fritzi Mandes, MD      . menthol-cetylpyridinium (CEPACOL) lozenge 3 mg  1 lozenge Oral PRN Fritzi Mandes, MD      . multivitamin with minerals tablet 1 tablet  1 tablet Oral Daily Loletha Grayer, MD   1 tablet at 07/03/15 [phone]  . nystatin (MYCOSTATIN/NYSTOP) topical powder   Topical BID Lance Coon, MD      . ondansetron [facility]) injection 4 mg  4 mg Intravenous Q6H PRN Fritzi Mandes, MD   4 mg at 07/02/15 1824  . propranolol (INDERAL) tablet 10 mg  10 mg Oral TID Loletha Grayer, MD   10 mg at 07/03/15 1540  . sodium chloride 0.9 % injection 3 mL  3 mL Intravenous Q12H Loletha Grayer, MD   3 mL at 07/03/15 0941  . thiamine (VITAMIN B-1) tablet 100 mg  100 mg Oral Daily Loletha Grayer, MD   100 mg at 07/03/15 [id number]   Or  . thiamine (B-1) injection 100 mg  100 mg Intravenous Daily Loletha Grayer, MD   100 mg at 07/01/15 0916  . traZODone (DESYREL) tablet 300 mg  300 mg Oral QHS Loletha Grayer, MD   300 mg at 07/02/15 2239  . venlafaxine XR (EFFEXOR-XR) 24 hr capsule 300 mg  300 mg Oral Q breakfast Loletha Grayer, MD   300 mg at 07/03/15 [id number]    Musculoskeletal: Strength & Muscle Tone: decreased Gait & Station: ataxic Patient leans: N/A  Psychiatric Specialty Exam: Review of Systems  Constitutional: Positive for malaise/fatigue.  HENT: Negative.   Eyes: Negative.   Respiratory: Negative.   Cardiovascular: Negative.   Gastrointestinal: Positive for abdominal pain.  Musculoskeletal: Negative.  Skin: Negative.   Neurological: Positive for  dizziness, tremors and weakness.  Psychiatric/Behavioral: Positive for depression, suicidal ideas, memory loss and substance abuse. Negative for hallucinations. The patient is nervous/anxious and has insomnia.     Blood pressure 121/63, pulse 80, temperature 98.1 F (36.7 C), temperature source Oral, resp. rate 19, height 5\' 4"  (1.626 m), weight 66.724 kg (147 lb 1.6 oz), SpO2 95 %.Body mass index is 25.24 kg/(m^2).  General Appearance: Disheveled  Eye Sport and exercise psychologist::  Fair  Speech:  Garbled  Volume:  Increased  Mood:  Anxious  Affect:  Labile  Thought Process:  Loose  Orientation:  Full (Time, Place, and Person)  Thought Content:  Negative  Suicidal Thoughts:  Yes.  with intent/plan  Homicidal Thoughts:  No  Memory:  Immediate;   Fair Recent;   Fair Remote;   Fair  Judgement:  Impaired  Insight:  Shallow  Psychomotor Activity:  Restlessness  Concentration:  Fair  Recall:  [employer] of Knowledge:Fair  Language: Fair  Akathisia:  No  Handed:  Right  AIMS (if indicated):     Assets:  Communication Skills Desire for Improvement Physical Health Resilience  ADL's:  Intact  Cognition: WNL  Sleep:      Treatment Plan Summary: Daily contact with patient to assess and evaluate symptoms and progress in treatment, Medication management and Plan I came to see the patient this afternoon with the treatment team was in the process of arranging her transfer to rehabilitation. Patient was feeling completely overwhelmed and was starting to panic even before I came in to see her. She is indicating that she is likely to act out and try to hurt herself given the way she is feeling now if she cannot have a longer taper on benzodiazepines. Given her past history I think it's probably true that she would be unsafe in her behavior in a rehabilitation type setting. She is not psychiatrically stable right now. I think she would benefit from inpatient psychiatric hospitalization. Patient is agreeable when I  suggested this. I've talked with case management on the unit and they are going to call the hospitalist. I have called the intake coordinator from [facility] and we do have a bed available. We will plan to admit her voluntarily to the psychiatry unit as a transfer from the medical service. Continue her current medicines as prescribed as well as continuing when necessary dry him for the next few days. Engage in groups and activities and help her work on discharge planning and plans for appropriate outpatient follow-up.  Disposition: Recommend psychiatric Inpatient admission when medically cleared.  Pamella Samons 07/03/2015 4:20 PM

## 2015-07-03 NOTE — Discharge Summary (Signed)
[facility] [place] NAME: [name]    MR#:  [id number]  DATE OF BIRTH:  September 21, 1955  DATE OF ADMISSION:  06/30/2015 ADMITTING PHYSICIAN: Loletha Grayer, MD  DATE OF DISCHARGE: 07/03/15  PRIMARY CARE PHYSICIAN: No primary care provider on file.    ADMISSION DIAGNOSIS:  Alcohol abuse [F10.10] Leukocytosis [D72.829] Tachycardia [R00.0] Acute alcohol intoxication, with unspecified complication ([place]) [id number]  DISCHARGE DIAGNOSIS:  Acute ETOH withdrawal Acute renal failure-resolved Depression/anxiety  SECONDARY DIAGNOSIS:   Past Medical History  Diagnosis Date  . Depression   . Anxiety   . Neuropathy ([place])   . Insomnia   . ETOH abuse     HOSPITAL COURSE:  [name] is a 60 y.o. female with a known history of alcohol abuse. She states that she stopped drinking for 5 years prior to starting drinking over Christmas time. She states that the withdrawal is the worst she's ever had. She's been drinking a case of beer a day and she's never been this sick.   1. Alcohol withdrawal. Cont CIWA protocol. -pt improved. Scoring low on ciwa Change to po Librium 25 mg bid x2-3 days per Dr clapacs  2. Hypokalemia- repelted  3. Hyponatremia likely with dehydration and increased alcohol use improved 4. Impaired fasting glucose hemoglobin A1c is 5.2  5. Leukocytosis- resolved Appears reactive  6. Anxiety depression- patient was put in for psych consultation from the ER. Denies suicidal ideation. Readdressed repeatedly ad pt denies any plans. She feels depressed  7. Tobacco abuse smoking cessation counseling done 3 minutes by me. Patient refused nicotine patch PT recommends rehab D/c to rehab.  CSW aware CONSULTS OBTAINED:  Treatment Team:  Loletha Grayer, MD Donita Brooks, MD Gonzella Lex, MD  DRUG ALLERGIES:   Allergies  Allergen Reactions  . Benadryl [Diphenhydramine] Hives and Other (See Comments)    Reaction:   Hyperactivity   . Cephalosporins Hives  . Codeine Nausea And Vomiting    DISCHARGE MEDICATIONS:   Current Discharge Medication List    START taking these medications   Details  chlordiazePOXIDE (LIBRIUM) 25 MG capsule Take 1 capsule (25 mg total) by mouth 2 (two) times daily. Qty: 10 capsule, Refills: 0    folic acid (FOLVITE) 1 MG tablet Take 1 tablet (1 mg total) by mouth daily. Qty: 30 tablet, Refills: 0    Multiple Vitamin (MULTIVITAMIN WITH MINERALS) TABS tablet Take 1 tablet by mouth daily. Qty: 30 tablet, Refills: 0    thiamine (VITAMIN B-1) 50 MG tablet Take 1 tablet (50 mg total) by mouth daily. Qty: 30 tablet, Refills: 0      CONTINUE these medications which have NOT CHANGED   Details  gabapentin (NEURONTIN) 300 MG capsule Take 900 mg by mouth 3 (three) times daily.    propranolol (INDERAL) 10 MG tablet Take 10 mg by mouth 3 (three) times daily.    traZODone (DESYREL) 150 MG tablet Take 2 tablets (300 mg total) by mouth at bedtime. Qty: 60 tablet, Refills: 0    venlafaxine XR (EFFEXOR-XR) 150 MG 24 hr capsule Take 2 capsules (300 mg total) by mouth daily with breakfast. Qty: 60 capsule, Refills: 0        If you experience worsening of your admission symptoms, develop shortness of breath, life threatening emergency, suicidal or homicidal thoughts you must seek medical attention immediately by calling 911 or calling your MD immediately  if symptoms less severe.  You Must read complete instructions/literature along  with all the possible adverse reactions/side effects for all the Medicines you take and that have been prescribed to you. Take any new Medicines after you have completely understood and accept all the possible adverse reactions/side effects.   Please note  You were cared for by a hospitalist during your hospital stay. If you have any questions about your discharge medications or the care you received while you were in the hospital after you are  discharged, you can call the unit and asked to speak with the hospitalist on call if the hospitalist that took care of you is not available. Once you are discharged, your primary care physician will handle any further medical issues. Please note that NO REFILLS for any discharge medications will be authorized once you are discharged, as it is imperative that you return to your primary care physician (or establish a relationship with a primary care physician if you do not have one) for your aftercare needs so that they can reassess your need for medications and monitor your lab values. Today   SUBJECTIVE   Doing ok  VITAL SIGNS:  Blood pressure 125/60, pulse 80, temperature 98.8 F (37.1 C), temperature source Oral, resp. rate 18, height 5\' 4"  (1.626 m), weight 66.724 kg (147 lb 1.6 oz), SpO2 97 %.  I/O:   Intake/Output Summary (Last 24 hours) at 07/03/15 1232 Last data filed at 07/03/15 1000  Gross per 24 hour  Intake      0 ml  Output   3000 ml  Net  -3000 ml    PHYSICAL EXAMINATION:  GENERAL:  60 y.o.-year-old patient lying in the bed with no acute distress.  EYES: Pupils equal, round, reactive to light and accommodation. No scleral icterus. Extraocular muscles intact.  HEENT: Head atraumatic, normocephalic. Oropharynx and nasopharynx clear.  NECK:  Supple, no jugular venous distention. No thyroid enlargement, no tenderness.  LUNGS: Normal breath sounds bilaterally, no wheezing, rales,rhonchi or crepitation. No use of accessory muscles of respiration.  CARDIOVASCULAR: S1, S2 normal. No murmurs, rubs, or gallops.  ABDOMEN: Soft, non-tender, non-distended. Bowel sounds present. No organomegaly or mass.  EXTREMITIES: No pedal edema, cyanosis, or clubbing.  NEUROLOGIC: Cranial nerves II through XII are intact. Muscle strength 5/5 in all extremities. Sensation intact. Gait not checked.  PSYCHIATRIC: The patient is alert and oriented x 3.  SKIN: No obvious rash, lesion, or ulcer.   DATA  REVIEW:   CBC   Recent Labs Lab 07/03/15 0545  WBC 5.8  HGB 11.8*  HCT 34.6*  PLT 125*    Chemistries   Recent Labs Lab 06/30/15 1245 06/30/15 2100 07/01/15 0455  NA 134*  --  142  K 3.3*  --  3.8  CL 97*  --  114*  CO2 13*  --  24  GLUCOSE 170*  --  83  BUN 33*  --  17  CREATININE 1.26*  --  0.62  CALCIUM 8.7*  --  8.0*  MG  --  2.4  --   AST 33  --   --   ALT 23  --   --   ALKPHOS 91  --   --   BILITOT 0.5  --   --     Microbiology Results   Recent Results (from the past 240 hour(s))  Culture, group A strep ([facility] only)     Status: None (Preliminary result)   Collection Time: 07/01/15  9:59 AM  Result Value Ref Range Status   Specimen Description THROAT  Final  Special Requests NONE  Final   Culture NO BETA HEMOLYTIC STREPTOCOCCI ISOLATED  Final   Report Status PENDING  Incomplete    RADIOLOGY:  No results found.   Management plans discussed with the patient, family and they are in agreement.  CODE STATUS:     Code Status Orders        Start     Ordered   06/30/15 2019  Full code   Continuous     06/30/15 2018    Code Status History    Date Active Date Inactive Code Status Order ID Comments User Context   01/25/2015  2:26 PM 01/30/2015  8:50 PM Full Code [id number]  Gonzella Lex, MD Inpatient   01/08/2015 12:44 PM 01/10/2015  3:45 PM Full Code [id number]  Henreitta Leber, MD Inpatient   12/21/2013  5:16 PM 12/27/2013 11:40 AM Full Code [id number]  Clarene Reamer, MD Inpatient   12/21/2013 10:13 AM 12/21/2013  5:16 PM Full Code [id number]  Virgel Manifold, MD ED      TOTAL TIME TAKING CARE OF THIS PATIENT: 40 minutes.    Kyanna Mahrt M.D on 07/03/2015 at 12:32 PM  Between 7am to 6pm - Pager - [phone] After 6pm go to [email or web address] - password [email or web address] [facility]  [facility]  Office  [phone]  CC: Primary care physician; No primary care provider on file.

## 2015-07-04 DIAGNOSIS — F332 Major depressive disorder, recurrent severe without psychotic features: Principal | ICD-10-CM

## 2015-07-04 MED ORDER — LORAZEPAM 1 MG PO TABS
1.0000 mg | ORAL_TABLET | Freq: Once | ORAL | Status: AC
  Administered 2015-07-04: 1 mg via ORAL
  Filled 2015-07-04: qty 1

## 2015-07-04 MED ORDER — CHLORDIAZEPOXIDE HCL 25 MG PO CAPS
50.0000 mg | ORAL_CAPSULE | Freq: Three times a day (TID) | ORAL | Status: DC
  Administered 2015-07-04 – 2015-07-08 (×11): 50 mg via ORAL
  Filled 2015-07-04 (×11): qty 2

## 2015-07-04 MED ORDER — PNEUMOCOCCAL VAC POLYVALENT 25 MCG/0.5ML IJ INJ
0.5000 mL | INJECTION | INTRAMUSCULAR | Status: AC
  Administered 2015-07-05: 0.5 mL via INTRAMUSCULAR
  Filled 2015-07-04: qty 0.5

## 2015-07-04 MED ORDER — INFLUENZA VAC SPLIT QUAD 0.5 ML IM SUSY
0.5000 mL | PREFILLED_SYRINGE | INTRAMUSCULAR | Status: AC
  Administered 2015-07-05: 0.5 mL via INTRAMUSCULAR
  Filled 2015-07-04: qty 0.5

## 2015-07-04 MED ORDER — PROMETHAZINE HCL 25 MG PO TABS
25.0000 mg | ORAL_TABLET | Freq: Three times a day (TID) | ORAL | Status: DC | PRN
  Administered 2015-07-05: 25 mg via ORAL
  Filled 2015-07-04 (×2): qty 1

## 2015-07-04 MED ORDER — PROMETHAZINE HCL 25 MG PO TABS
25.0000 mg | ORAL_TABLET | Freq: Once | ORAL | Status: AC
  Administered 2015-07-04: 25 mg via ORAL
  Filled 2015-07-04: qty 1

## 2015-07-04 NOTE — Progress Notes (Signed)
Recreation Therapy Notes  Date: 01.11.17 Time: 3:00 pm Location: Craft Room  Group Topic: Self-esteem  Goal Area(s) Addresses:  Patient will write at least one positive trait. Patient will verbalize benefit of having a healthy self-esteem.  Behavioral Response: Attentive  Intervention: I Am  Activity: Patients were given a worksheet with the letter I and instructed to write as many positive traits inside the letter as they could.  Education: LRT educated patients on ways they can increase their self-esteem.  Education Outcome: Acknowledges education/In group clarification offered  Clinical Observations/Feedback: Patient wrote positive traits. Patient did not contribute to group discussion.  Leonette Monarch, LRT/CTRS 07/04/2015 4:48 PM
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
## 2015-07-04 NOTE — Evaluation (Signed)
Physical Therapy Evaluation Patient Details Name: [name] MRN: [id number] DOB: June 30, 1955 Today's Date: 07/04/2015   History of Present Illness  Pt is a 60 y.o. female presenting to [facility] hospital with alcohol withdrawal and dehydration (pt stopped drinking 5 years ago and recently started drinking a case of beer a day; alcohol level 221 when presenting to hospital).  Pt now discharged to [facility] (on 07/03/15) and new PT consult received.  PMH includes L above elbow arm amputation, R hip surgery, ETOH abuse, neuropathy, anxiety, depression.  Clinical Impression  Prior to admission, pt was independent without AD.  Pt lives alone on 2nd floor of home with 14-15 steps to enter with railing.  Currently pt is min assist x2 to ambulate 100 feet with quad cane (limited distance d/t fatigue); pt requiring assist for balance and cueing for gait technique and quad cane use.  Pt would benefit from skilled PT to address noted impairments and functional limitations.  Recommend pt discharge to STR (pending pt's progress) when medically appropriate.     Follow Up Recommendations SNF (pending pt's progress)    Equipment Recommendations   (TBD)    Recommendations for Other Services       Precautions / Restrictions Precautions Precautions: Fall Precaution Comments: CIWA Restrictions Weight Bearing Restrictions: No      Mobility  Bed Mobility Overal bed mobility: Needs Assistance Bed Mobility: Supine to Sit;Sit to Supine     Supine to sit: Supervision Sit to supine: Supervision      Transfers Overall transfer level: Needs assistance Equipment used: None Transfers: Sit to/from Stand Sit to Stand: Min assist (from low bed)         General transfer comment: assist for balance once standing  Ambulation/Gait Ambulation/Gait assistance: Min assist;+2 physical assistance Ambulation Distance (Feet): 100 Feet Assistive device: Quad cane   Gait velocity: decreased   General  Gait Details: decreased B step length/foot clearance/heelstrike but improved with vc's for quad cane use and stepping pattern  Stairs            Wheelchair Mobility    Modified Rankin (Stroke Patients Only)       Balance Overall balance assessment: Needs assistance Sitting-balance support: Bilateral upper extremity supported;Feet supported Sitting balance-Leahy Scale: Normal     Standing balance support: Single extremity supported;During functional activity (on quad cane) Standing balance-Leahy Scale: Fair Standing balance comment: tremorous in general                             Pertinent Vitals/Pain Pain Assessment: No/denies pain  Vitals stable and WFL throughout treatment session.    Home Living Family/patient expects to be discharged to:: Private residence Living Arrangements: Alone   Type of Home:  (2nd floor of house) Home Access: Stairs to enter   Entrance Stairs-Number of Steps: 14 with B rail from outside or 15 with L rail inside Home Layout: One [place]: None      Prior Function Level of Independence: Independent         Comments: Pt reports 1 fall in kitchen in November when her L hip "gave out"     Hand Dominance        Extremity/Trunk Assessment   Upper Extremity Assessment: Generalized weakness           Lower Extremity Assessment: Generalized weakness      Cervical / Trunk Assessment: Normal  Communication   Communication: No difficulties  Cognition Arousal/Alertness: Awake/alert Behavior During Therapy: Anxious Overall Cognitive Status: Within Functional Limits for tasks assessed                      General Comments   Nursing cleared pt for participation in physical therapy.  Pt agreeable to PT session.    Exercises   Performed semi-supine B LE therapeutic exercise x 10 reps:  Ankle pumps (AROM B LE's); quad sets x3 second holds (AROM B LE's); glute squeezes x3 second holds (AROM B);  SAQ's (AROM R; AROM L); heelslides (AROM R; AROM L), hip abd/adduction (AROM R; AROM L).  Pt required vc's and tactile cues for correct technique with exercises.       Assessment/Plan    PT Assessment Patient needs continued PT services  PT Diagnosis Difficulty walking;Generalized weakness   PT Problem List Decreased strength;Decreased activity tolerance;Decreased balance;Decreased mobility  PT Treatment Interventions DME instruction;Gait training;Stair training;Functional mobility training;Therapeutic activities;Therapeutic exercise;Balance training;Patient/family education   PT Goals (Current goals can be found in the Care Plan section) Acute Rehab PT Goals Patient Stated Goal: to be able to walk again independently PT Goal Formulation: With patient Time For Goal Achievement: 07/18/15 Potential to Achieve Goals: Good    Frequency Min 2X/week   Barriers to discharge Decreased caregiver support      Co-evaluation               End of Session Equipment Utilized During Treatment: Gait belt Activity Tolerance: Patient limited by fatigue Patient left: in bed;with call bell/phone within reach Nurse Communication: Mobility status;Precautions         Time: [id number] PT Time Calculation (min) (ACUTE ONLY): 25 min   Charges:   PT Evaluation $PT Eval Moderate Complexity: 1 Procedure PT Treatments $Therapeutic Exercise: 8-22 mins   PT G CodesLeitha Bleak 2015-07-18, 5:07 PM Leitha Bleak, [place]

## 2015-07-04 NOTE — BHH Group Notes (Signed)
[place] LCSW Group Therapy  07/04/2015 5:44 PM  Type of Therapy:  Group Therapy  Participation Level:  Active  Participation Quality:  Attentive  Affect:  Depressed  Cognitive:  Alert  Insight:  Improving  Engagement in Therapy:  Improving  Modes of Intervention:  Education, Socialization and Support  Summary of Progress/Problems: Emotional Regulation: Patients will identify both negative and positive emotions. They will discuss emotions they have difficulty regulating and how they impact their lives. Patients will be asked to identify healthy coping skills to combat unhealthy reactions to negative emotions.  [name] attended group and stayed the entire time. She discussed feeling depressed and binge drinking prior to admission. She discussed feeling extremely anxious which makes it difficult to concentrate.   [name] MSW, LCSWA  07/04/2015, 5:44 PM

## 2015-07-04 NOTE — H&P (Signed)
Psychiatric Admission Assessment Adult  Patient Identification: [name] MRN:  [id number] Date of Evaluation:  07/04/2015 Chief Complaint:  Substance induced mood disorder Principal Diagnosis: Severe recurrent major depression without psychotic features ([place]) Diagnosis:   Patient Active Problem List   Diagnosis Date Noted  . Severe recurrent major depression without psychotic features ([place]) [F33.2] 07/03/2015  . Alcohol use disorder, severe, dependence ([place]) [F10.20] 01/26/2015  . Tobacco use disorder [F17.200] 01/26/2015  . Alcohol withdrawal ([place]) [F10.239] 01/08/2015  . 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 GAD (generalized anxiety disorder) [F41.1] 12/24/2013  . PTSD (post-traumatic stress disorder) [F43.10] 12/22/2013   History of Present Illness:  [name] is a 60 y.o. female that presented on 06/30/15 to our ER via EMS stating she had relapsed on alcohol over the Christmas Holidays due to problems with her current relationship. Patient reports ongoing daily use consuming 12 to 24 beers a day since Christmas. Alcohol level was 221 on presentation.  Patient is well-known to our service as she has been hospitalized in our behavioral health unit on multitude of times due to complications secondary to alcoholism. Patient also suffers from major depressive disorder and PTSD.  Patient states she discontinued all her psychiatric medications last week due to her alcohol use. Patient does admit to multiple attempts at trying to maintain her sobriety but has been unsuccessful although stated she had maintained her sobriety for "a little while" prior to this relapse. Patient stated she has active SI over a recently failed relationship and was trying to "drink herself to death." Patient stated she "wants to be done with it all."   Patient was transferred to the medical floor from January 7 to January 10 due to alcohol withdrawals. The patient also  had leukocytosis and hypernatremia. Once the patient was stabilized psychiatry was consulted. The patient continued to voice suicidal ideation and therefore she was transferred to psychiatry.  During her treatment on the medical floor she was seen by physical therapy as the patient appeared to have generalized weakness. They recommended inpatient rehabilitation at discharge. Per notes from social worker from the medical floor the patient was accepted to a nursing home skilled facility.  Per social worker assessment today here in the behavioral health unit patient has refused to go to the nursing facility. She is requesting to be discharged to a hotel room.  Today she tells me she does feel very depressed but no longer has thoughts about wanting to die. Patient is states recent relapse on alcohol was triggered after she spent 2 weeks with a boyfriend, they were both drinking heavily up to a case a day. After 2 weeks he left and this is when the patient contacted EMS.  She still feels she is undergoing withdrawals. She complains of tremors, feeling cold all the time and having nausea. Patient says she will agree to go to a nursing home for physical therapy after discharge. She is currently living in a apartment but has been addicted and has to move out of her apartment before the third. She thinks her daughter can help her move out of her furniture and other belongings out of her apartment.  Associated Signs/Symptoms: Depression Symptoms:  depressed mood, recurrent thoughts of death, decreased appetite, (Hypo) Manic Symptoms:  denies Anxiety Symptoms:  Excessive Worry, Psychotic Symptoms:  denies PTSD Symptoms: Had a traumatic exposure:  see note above   Total Time spent with patient: 1 hour    Past Psychiatric  History: This is her 5th admission to our unit since December 2015.  Currently f[facility]u with [facility].  Long history of alcohol abuse, depression, and anxiety. Has had some suicide attempts in the  past but mostly also by substance abuse. She has had hospitalizations for detoxification and hospitalizations for stabilization of mood and anxiety. She is currently taking venlafaxine extended release 300 mg a day which had been helpful for her anxiety and depression. She has taken other medicines in the past as well, which she found less helpful.  Patient has been hospitalized before, she has been at [facility], that was her first hospitalization at the age of 46. She states that she was diagnosed with borderline personality disorder and was prescribed with lithium, BuSpar, and amitriptyline. She was also hospitalized in [place] where she was diagnosed with bipolar disorder. She has been in inpatient rehabilitation.  The patient denies any history of suicidal attempts in the past and denies any history of self-injurious behaviors.  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 She is currently taking Neurontin 900 mg 3 times a day. She denies any history of seizures or head trauma. No other medical problems were reported. Past Medical History  Diagnosis Date  . Depression   . Anxiety   . Neuropathy ([place])   . Insomnia   . ETOH abuse     Past Surgical History  Procedure Laterality Date  . Cesarean section    . Joint replacement Right   . 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 Abdominal hysterectomy    . Right hip surgery     Family History:  Family History  Problem Relation Age of Onset  . COPD Mother   . Alcoholism Father   . COPD Father    Family Psychiatric  History:The patient reports that sister, mother, aunt, and cousin have been diagnosed with borderline personality disorder and bipolar disorder. She said that one of her sisters abuses alcohol and there is no family history of suicide.   Social History: Not working. Lives by herself in an apartment. Has adult  children with whom she is an occasional contact. Pretty much estranged from all the rest of her family. She says when she is sober she does have social contact with people she enjoys spending time with.  She receives disability. She has two adult children who are in their 52's.She denies any history of legal problems. In terms of education she reports having an associate degree. History  Alcohol Use  . 14.4 oz/week  . 24 Cans of beer per week    Comment: 1 case per day     History  Drug Use No    Comment: Patient denies     Social History   Social History  . Marital Status: Divorced    Spouse Name: N/A  . Number of Children: N/A  . Years of Education: N/A   Social History Main Topics  . Smoking status: Current Every Day Smoker -- 0.50 packs/day    Types: Cigarettes  . Smokeless tobacco: Never Used  . Alcohol Use: 14.4 oz/week    24 Cans of beer per week     Comment: 1 case per day  . Drug Use: No     Comment: Patient denies   . Sexual Activity: Yes   Other Topics Concern  .  None   Social History Narrative    Allergies:   Allergies  Allergen Reactions  . Benadryl [Diphenhydramine] Hives and Other (See Comments)    Reaction:  Hyperactivity   . Cephalosporins Hives  . Codeine Nausea And Vomiting   Lab Results:  Results for orders placed or performed during the hospital encounter of 06/30/15 (from the past 48 hour(s))  CBC with Differential/Platelet     Status: Abnormal   Collection Time: 07/03/15  5:45 AM  Result Value Ref Range   WBC 5.8 3.6 - 11.0 K/uL   RBC 3.73 (L) 3.80 - 5.20 MIL/uL   Hemoglobin 11.8 (L) 12.0 - 16.0 g/dL   HCT 34.6 (L) 35.0 - 47.0 %   MCV 92.8 80.0 - 100.0 fL   MCH 31.7 26.0 - 34.0 pg   MCHC 34.2 32.0 - 36.0 g/dL   RDW 13.6 11.5 - 14.5 %   Platelets 125 (L) 150 - 440 K/uL   Neutrophils Relative % 49 %   Neutro Abs 2.9 1.4 - 6.5 K/uL   Lymphocytes Relative 40 %   Lymphs Abs 2.3 1.0 - 3.6 K/uL   Monocytes Relative 7 %   Monocytes  Absolute 0.4 0.2 - 0.9 K/uL   Eosinophils Relative 3 %   Eosinophils Absolute 0.2 0 - 0.7 K/uL   Basophils Relative 1 %   Basophils Absolute 0.0 0 - 0.1 K/uL    Metabolic Disorder Labs:  Lab Results  Component Value Date   HGBA1C 5.2 06/30/2015   No results found for: PROLACTIN No results found for: CHOL, TRIG, HDL, CHOLHDL, VLDL, LDLCALC  Current Medications: Current Facility-Administered Medications  Medication Dose Route Frequency Provider Last Rate Last Dose  . acetaminophen (TYLENOL) tablet 650 mg  650 mg Oral Q6H PRN Gonzella Lex, MD      . alum & mag hydroxide-simeth (MAALOX/MYLANTA) 200-200-20 MG/5ML suspension 30 mL  30 mL Oral Q4H PRN Gonzella Lex, MD      . chlordiazePOXIDE (LIBRIUM) capsule 25 mg  25 mg Oral QID Gonzella Lex, MD   25 mg at 07/04/15 1514  . folic acid (FOLVITE) tablet 1 mg  1 mg Oral Daily Gonzella Lex, MD   1 mg at 07/04/15 1048  . gabapentin (NEURONTIN) capsule 900 mg  900 mg Oral TID Gonzella Lex, MD   900 mg at 07/04/15 1514  . [START ON 07/05/2015] Influenza vac split quadrivalent PF (FLUARIX) injection 0.5 mL  0.5 mL Intramuscular Tomorrow-1000 Hildred Priest, MD      . magnesium hydroxide (MILK OF MAGNESIA) suspension 30 mL  30 mL Oral Daily PRN Gonzella Lex, MD      . menthol-cetylpyridinium (CEPACOL) lozenge 3 mg  1 lozenge Oral PRN Gonzella Lex, MD   3 mg at 07/04/15 1515  . multivitamin with minerals tablet 1 tablet  1 tablet Oral Daily Gonzella Lex, MD   1 tablet at 07/04/15 1048  . nystatin (MYCOSTATIN/NYSTOP) topical powder   Topical BID Gonzella Lex, MD      . Derrill Memo ON 07/05/2015] pneumococcal 23 valent vaccine (PNU-IMMUNE) injection 0.5 mL  0.5 mL Intramuscular Tomorrow-1000 Hildred Priest, MD      . propranolol (INDERAL) tablet 10 mg  10 mg Oral TID Gonzella Lex, MD   10 mg at 07/04/15 1514  . traZODone (DESYREL) tablet 300 mg  300 mg Oral QHS Gonzella Lex, MD      . venlafaxine XR (EFFEXOR-XR) 24  hr capsule  300 mg  300 mg Oral Q breakfast Gonzella Lex, MD   300 mg at 07/04/15 1053   PTA Medications: Prescriptions prior to admission  Medication Sig Dispense Refill Last Dose  . chlordiazePOXIDE (LIBRIUM) 25 MG capsule Take 1 capsule (25 mg total) by mouth 2 (two) times daily. 10 capsule 0   . folic acid (FOLVITE) 1 MG tablet Take 1 tablet (1 mg total) by mouth daily. 30 tablet 0   . gabapentin (NEURONTIN) 300 MG capsule Take 900 mg by mouth 3 (three) times daily.   unknown at Unknown time  . Multiple Vitamin (MULTIVITAMIN WITH MINERALS) TABS tablet Take 1 tablet by mouth daily. 30 tablet 0   . propranolol (INDERAL) 10 MG tablet Take 10 mg by mouth 3 (three) times daily.   unknown at Unknown time  . thiamine (VITAMIN B-1) 50 MG tablet Take 1 tablet (50 mg total) by mouth daily. 30 tablet 0   . traZODone (DESYREL) 150 MG tablet Take 2 tablets (300 mg total) by mouth at bedtime. 60 tablet 0 unknown at Unknown time  . venlafaxine XR (EFFEXOR-XR) 150 MG 24 hr capsule Take 2 capsules (300 mg total) by mouth daily with breakfast. 60 capsule 0 unknown at Unknown time    Musculoskeletal: Strength & Muscle Tone: within normal limits Gait & Station: normal Patient leans: N/A  Psychiatric Specialty Exam: Physical Exam  Constitutional: She is oriented to person, place, and time. She appears well-developed and well-nourished.  HENT:  Head: Normocephalic and atraumatic.  Eyes: Conjunctivae and EOM are normal.  Neck: Normal range of motion.  Respiratory: Effort normal and breath sounds normal.  Musculoskeletal: Normal range of motion.  Neurological: She is alert and oriented to person, place, and time.  Skin: Skin is warm and dry.    Review of Systems  HENT: Negative.   Eyes: Negative.   Respiratory: Negative.   Cardiovascular: Negative.   Gastrointestinal: Positive for nausea.  Genitourinary: Negative.   Musculoskeletal: Negative.   Skin: Negative.   Neurological: Positive for  tremors and weakness.  Endo/Heme/Allergies: Negative.   Psychiatric/Behavioral: Positive for depression, suicidal ideas and substance abuse.    Blood pressure 144/69, pulse 88, temperature 97.8 F (36.6 C), temperature source Oral, resp. rate 20, height 5\' 4"  (1.626 m), weight 61.236 kg (135 lb).Body mass index is 23.16 kg/(m^2).  General Appearance: Disheveled  Eye Contact::  Good  Speech:  Slow  Volume:  Decreased  Mood:  Dysphoric  Affect:  Blunt  Thought Process:  Linear  Orientation:  Full (Time, Place, and Person)  Thought Content:  Hallucinations: None  Suicidal Thoughts:  No  Homicidal Thoughts:  No  Memory:  Immediate;   Fair Recent;   Fair Remote;   Fair  Judgement:  Poor  Insight:  Shallow  Psychomotor Activity:  Decreased  Concentration:  Fair  Recall:  [employer] of Knowledge:Good  Language: Good  Akathisia:  No  Handed:    AIMS (if indicated):     Assets:  Communication Skills  ADL's:  Intact  Cognition: WNL  Sleep:  Number of Hours: 4     Treatment Plan Summary: Daily contact with patient to assess and evaluate symptoms and progress in treatment and Medication management   60 year old Caucasian female with history of depression,  PTSD and alcoholism presented to our emergency department with alcohol withdrawal. The symptoms were so severe that the patient was transferred to the medical floor. There the patient was treated but continued to voice suicidal  ideation and therefore she was transferred to our psychiatric facility. The patient has been in our unit a multitude of times in the past under very similar circumstances.  MAJOR DEPRESSIVE DISORDER: Continue Effexor XR 300 mg by mouth daily  Insomnia: continue trazodone 300 mg by mouth daily at bedtime  Anxiety: continue Inderal 10 mg by mouth 3 times a day  Alcohol withdrawal: continue Librium however I will increase the dose to 50 mg by mouth 3 times a day. I will give her one dose of Ativan as she  appears to have withdrawals right now.  Nausea: will start Phenergan 25 mg every 8 hours as needed  Chronic pain neuropathic pain: continue Neurontin 900 mg by mouth 3 times a day  Unsteady gait weakness: patient evaluated by physical therapy while on the medical floor. They recommended transfer the patient to a nursing facility for physical therapy. Physical therapy will continue to follow-up the patient was in the hospital.  Precautions every 15 minute checks  Diet regular  Hospitalization the patient is currently under involuntary commitment  Discharge disposition: once psychiatrically is stable she will be transferred to nursing home facility to continue physical therapy  Discharge follow-up: patient will continue to follow-up with [facility].  I certify that inpatient services furnished can reasonably be expected to improve the patient's condition.   Hildred Priest 1/11/20173:34 PM

## 2015-07-04 NOTE — Progress Notes (Signed)
   07/04/15 1020  Clinical Encounter Type  Visited With Patient  Visit Type Initial;Spiritual support;Behavioral Health  Referral From Nurse  Consult/Referral To Chaplain  Spiritual Encounters  Spiritual Needs Prayer;Emotional  Stress Factors  Patient Stress Factors Exhausted;Family relationships;Health changes;Major life changes;Loss of control  Met w/patient for initial intake, pastoral counsel, & prayer. Chap. Antario Yasuda G. [place]

## 2015-07-04 NOTE — Progress Notes (Signed)
Patient ID: [name], female   DOB: 1956/02/19, 60 y.o.   MRN: [id number] PER STATE REGULATIONS 482.30  THIS CHART WAS REVIEWED FOR MEDICAL NECESSITY WITH RESPECT TO THE PATIENT'S ADMISSION/DURATION OF STAY.  NEXT REVIEW DATE:07/07/15  Roma Schanz, RN, BSN CASE MANAGER

## 2015-07-04 NOTE — Progress Notes (Signed)
D: Patient received from 1C medical floor. Pt sent via wheelchair, and pt unsteady with ambulation. Pt states that she normally ambulates without issue, but is having trouble due to alcohol detox. Patient alert and oriented x4. Patient denies SI/HI/AVH. Pt affect is depressed. Pt had talked about drinking 24 cans of beer a day for past 2 weeks. Pt described wanting to drink so much that she would die, but once she became increasingly drunk, she became scared and called 911. Pt states "This time I really want to change" and indicates she is motivated to manage her anxiety and become sober. Pt rates anxiety 7/10 and depression 5/10. Mild tremors noted, and pt c/o of mild sweating and tactile disturbances. No contraband found. Skin check found no abnormalities A: Skin and contraband check performed with [place] Olp. Reviewed admission info with pt. Educated pt on unit policy. Oriented pt to unit. Advised pt on using call bell when pt needs to ambulate. Offered active listening and support. Provided therapeutic communication. Administered scheduled medications.  R: Pt verbalized understanding of unit policy and call light use. Pt pleasant and cooperative. Pt med compliant. Will continue Q15 min. Checks. Safety maintained.
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
## 2015-07-04 NOTE — BHH Group Notes (Signed)
[facility] LCSW Aftercare Discharge Planning Group Note   07/04/2015 5:22 PM  Participation Quality:  Active   Mood/Affect:  Depressed  Depression Rating:  8  Anxiety Rating:  10  Thoughts of Suicide:  No Will you contract for safety?   NA  Current AVH:  No  Plan for Discharge/Comments:  Pt is unsure of d/c plan. She came to the hospital for alcohol detox. She spent a few days on the medical floor prior to admission to BMU. Pt currently receives outpatient services at [facility].   Transportation Means: Family  Supports: [place] MSW, [employer]

## 2015-07-04 NOTE — BHH Group Notes (Signed)
[place] Group Notes:  (Nursing/MHT/Case Management/Adjunct)  Date:  07/04/2015  Time:  11:55 PM  Type of Therapy:  Evening Wrap-up Group  Participation Level:  Active  Participation Quality:  Appropriate and Attentive  Affect:  Appropriate  Cognitive:  Alert and Appropriate  Insight:  Good  Engagement in Group:  Engaged  Modes of Intervention:  Discussion  Summary of Progress/Problems: Pt. Discussed meeting her goal which was to attend groups. Pt.states she had an "awful day." Levonne Spiller 07/04/2015, 11:55 PM

## 2015-07-04 NOTE — Tx Team (Signed)
Initial Interdisciplinary Treatment Plan   PATIENT STRESSORS: Financial difficulties Being kicked out of apartment   PATIENT STRENGTHS: Average or above average intelligence Motivation for treatment/growth   PROBLEM LIST: Problem List/Patient Goals Date to be addressed Date deferred Reason deferred Estimated date of resolution  Alcohol abuse 07/04/2015     Suicide Attempt 07/04/2015     "Dealing with anxiety" 07/04/2015     "Dealing with substance abuse" 07/04/2015                                    DISCHARGE CRITERIA:  Improved stabilization in mood, thinking, and/or behavior Motivation to continue treatment in a less acute level of care  PRELIMINARY DISCHARGE PLAN: Attend aftercare/continuing care group Attend 12-step recovery group Placement in alternative living arrangements  PATIENT/FAMIILY INVOLVEMENT: This treatment plan has been presented to and reviewed with the patient, [name].  The patient and family have been given the opportunity to ask questions and make suggestions.  [name] 07/04/2015, 6:07 AM

## 2015-07-05 DIAGNOSIS — F332 Major depressive disorder, recurrent severe without psychotic features: Secondary | ICD-10-CM | POA: Diagnosis not present

## 2015-07-05 LAB — LIPID PANEL
Cholesterol: 195 mg/dL (ref 0–200)
HDL: 50 mg/dL (ref >40)
LDL CALC: 116 mg/dL — AB (ref 0–99)
Total CHOL/HDL Ratio: 3.9 RATIO
Triglycerides: 143 mg/dL (ref <150)
VLDL: 29 mg/dL (ref 0–40)

## 2015-07-05 MED ORDER — TRAZODONE HCL 100 MG PO TABS
150.0000 mg | ORAL_TABLET | Freq: Every day | ORAL | Status: DC
  Administered 2015-07-05 – 2015-07-08 (×4): 150 mg via ORAL
  Filled 2015-07-05 (×4): qty 1

## 2015-07-05 MED ORDER — GABAPENTIN 300 MG PO CAPS
900.0000 mg | ORAL_CAPSULE | Freq: Three times a day (TID) | ORAL | Status: DC
  Administered 2015-07-05 – 2015-07-09 (×12): 900 mg via ORAL
  Filled 2015-07-05 (×12): qty 3

## 2015-07-05 MED ORDER — PROPRANOLOL HCL 10 MG PO TABS
10.0000 mg | ORAL_TABLET | Freq: Three times a day (TID) | ORAL | Status: DC
  Administered 2015-07-05 – 2015-07-09 (×12): 10 mg via ORAL
  Filled 2015-07-05 (×12): qty 1

## 2015-07-05 MED ORDER — LORAZEPAM 1 MG PO TABS
1.0000 mg | ORAL_TABLET | Freq: Once | ORAL | Status: AC
  Administered 2015-07-05: 1 mg via ORAL
  Filled 2015-07-05: qty 1

## 2015-07-05 NOTE — BHH Group Notes (Signed)
[name] Group Notes:  (Nursing/MHT/Case Management/Adjunct)  Date:  07/05/2015  Time:  9:20 AM  Type of Therapy:  Community Meeting   Participation Level:  Did Not Attend  [name] [name] 07/05/2015, 9:20 AM

## 2015-07-05 NOTE — Plan of Care (Signed)
Problem: Alteration in mood & ability to function due to Goal: LTG-Pt reports reduction in suicidal thoughts (Patient reports reduction in suicidal thoughts and is able to verbalize a safety plan for whenever patient is feeling suicidal)  Outcome: Progressing Patient denies SI/HI.      
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
## 2015-07-05 NOTE — Progress Notes (Signed)
D:  Pt is pleasant and cooperative.  Affect is flat. Mood is sad.  Patient is interacting with peers and staff appropriately.  Per self-inventory, patient slept well, appetite is fair, and energy level is low.  Patient denies suicidal ideation, homicidal ideation, auditory or visual hallucinations currently.  A:  Pt was offered support and encouragement. Pt was given scheduled medications. Pt was encouraged to attend groups. Q 15 minute checks were done for safety.  R:  Patient attends groups and interacts well with peers and staff. Pt is taking medication. Pt receptive to treatment and safety maintained on unit.

## 2015-07-05 NOTE — BHH Group Notes (Signed)
[place] LCSW Group Therapy  07/05/2015 8:58 PM  Type of Therapy:  Group Therapy  Participation Level:  Active  Participation Quality:  Attentive  Affect:  Depressed  Cognitive:  Alert  Insight:  Improving  Engagement in Therapy:  Improving  Modes of Intervention:  Discussion, Education, Socialization and Support  Summary of Progress/Problems: Balance in life: Patients will discuss the concept of balance and how it looks and feels to be unbalanced. Pt will identify areas in their life that is unbalanced and ways to become more balanced.  [name] attended group and stayed the entire time. She discussed the need for a healthy support system. She identified her family, AA meetings and peer supports as helpful supports.   [place] MSW, LCSWA  07/05/2015, 8:58 PM

## 2015-07-05 NOTE — Progress Notes (Signed)
Recreation Therapy Notes  INPATIENT RECREATION THERAPY ASSESSMENT  Patient Details Name: CASHLYN COMTE MRN: [id number] DOB: 1956-05-19 Today's Date: 07/05/2015  Patient Stressors: Friends, Other (Comment) (Lack of supportive friends; has to find a new place to live; landlord is very stressful)  Coping Skills:   Isolate, Substance Abuse, Avoidance, Art/Dance, Talking, Music, Sports, Other (Comment) (Meditation; going to meetings; doing activities)  Personal Challenges: Concentration, Decision-Making, Problem-Solving, Relationships, Self-Esteem/Confidence, Stress Management, Substance Abuse, Time Management, Trusting Others  Leisure Interests (2+):  Individual - Other (Comment) (Dancing, going out to eat)  Awareness of Community Resources:  No  Community Resources:     Current Use:    If no, Barriers?:    Patient Strengths:  Good sense of humor, great mom  Patient Identified Areas of Improvement:  Everything  Current Recreation Participation:  Going out to eat, going to see bands  Patient Goal for Hospitalization:  To get stabilized  [place] of Residence:  [place] of Residence:  [place]   Current SI (including self-harm):  No  Current HI:  No  Consent to Intern Participation: N/A   Leonette Monarch, LRT/CTRS 07/05/2015, 4:41 PM

## 2015-07-05 NOTE — Progress Notes (Addendum)
[facility], [place] MD Progress Note  07/05/2015 10:49 AM [name]  MRN:  [id number] Subjective:  Patient reports doing better as far as withdrawals. She complained of feeling very anxious this morning and therefore she receive and next time Ativan 1 mg. She also has been having sinus congestion and requested medications for now.   Patient states that mood wise she is a little better but still depressed. She is no longer voicing suicidal thoughts. States that she is trying to participate in programming as much as she can, however she still very weak.  Reports decreased energy, decreased appetite, poor concentration, depressed mood. Denies side effects from medications. Denies other physical complaints.  Per nursing: D: Pt is pleasant and cooperative. Affect is flat. Mood is sad. Patient is interacting with peers and staff appropriately. Per self-inventory, patient slept well, appetite is fair, and energy level is low. Patient denies suicidal ideation, homicidal ideation, auditory or visual hallucinations currently.  A: Pt was offered support and encouragement. Pt was given scheduled medications. Pt was encouraged to attend groups. Q 15 minute checks were done for safety.  R: Patient attends groups and interacts well with peers and staff. Pt is taking medication. Pt receptive to treatment and safety maintained on unit.   Principal Problem: Severe recurrent major depression without psychotic features ([place]) Diagnosis:   Patient Active Problem List   Diagnosis Date Noted  . Severe recurrent major depression without psychotic features ([place]) [F33.2] 07/03/2015  . Alcohol use disorder, severe, dependence ([place]) [F10.20] 01/26/2015  . Tobacco use disorder [F17.200] 01/26/2015  . Alcohol withdrawal ([place]) [F10.239] 01/08/2015  . 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 GAD (generalized anxiety disorder) [F41.1] 12/24/2013  . PTSD (post-traumatic stress disorder)  [F43.10] 12/22/2013   Total Time spent with patient: 30 minutes   Sleep: Good  Appetite:  [place]   Past Psychiatric History: This is her 5th admission to our unit since December 2015. Currently f/u with [facility]. Long history of alcohol abuse, depression, and anxiety. Has had some suicide attempts in the past but mostly also by substance abuse. She has had hospitalizations for detoxification and hospitalizations for stabilization of mood and anxiety. She is currently taking venlafaxine extended release 300 mg a day which had been helpful for her anxiety and depression. She has taken other medicines in the past as well, which she found less helpful. Patient has been hospitalized before, she has been at [facility], that was her first hospitalization at the age of 84. She states that she was diagnosed with borderline personality disorder and was prescribed with lithium, BuSpar, and amitriptyline. She was also hospitalized in [place] where she was diagnosed with bipolar disorder. She has been in inpatient rehabilitation. The patient denies any history of suicidal attempts in the past and denies any history of self-injurious behaviors.   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 She is currently taking Neurontin 900 mg 3 times a day. She denies any history of seizures or head trauma. No other medical problems were reported. Past Medical History  Diagnosis Date  . Depression   . Anxiety   . Neuropathy ([place])   . Insomnia   . ETOH abuse     Past Surgical History  Procedure Laterality Date  . Cesarean section    . Joint replacement Right   . 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 Abdominal hysterectomy    .  Right hip surgery     Family History:  Family History  Problem Relation Age of Onset  . COPD Mother   . Alcoholism Father   . COPD Father    Family Psychiatric History:The  patient reports that sister, mother, aunt, and cousin have been diagnosed with borderline personality disorder and bipolar disorder. She said that one of her sisters abuses alcohol and there is no family history of suicide.   Social History: Not working. Lives by herself in an apartment. Has adult children with whom she is an occasional contact. Pretty much estranged from all the rest of her family. She says when she is sober she does have social contact with people she enjoys spending time with. She receives disability. She has two adult children who are in their 59's.She denies any history of legal problems. In terms of education she reports having an associate degree. History  Alcohol Use  . 14.4 oz/week  . 24 Cans of beer per week    Comment: 1 case per day    History  Drug Use No    Comment: Patient denies     Social History   Social History  . Marital Status: Divorced    Spouse Name: N/A  . Number of Children: N/A  . Years of Education: N/A   Social History Main Topics  . Smoking status: Current Every Day Smoker -- 0.50 packs/day    Types: Cigarettes  . Smokeless tobacco: Never Used  . Alcohol Use: 14.4 oz/week    24 Cans of beer per week     Comment: 1 case per day  . Drug Use: No     Comment: Patient denies   . Sexual Activity: Yes   Other Topics Concern  . None   Social History Narrative    Allergies:  Allergies  Allergen Reactions  . Benadryl [Diphenhydramine] Hives and Other (See Comments)    Reaction: Hyperactivity   . Cephalosporins Hives  . Codeine Nausea And Vomiting         Current Medications: Current Facility-Administered Medications  Medication Dose Route Frequency Provider Last Rate Last Dose  . acetaminophen (TYLENOL) tablet 650 mg  650 mg Oral Q6H PRN Gonzella Lex, MD      . alum & mag hydroxide-simeth (MAALOX/MYLANTA) 200-200-20 MG/5ML suspension  30 mL  30 mL Oral Q4H PRN Gonzella Lex, MD      . chlordiazePOXIDE (LIBRIUM) capsule 50 mg  50 mg Oral TID Hildred Priest, MD   50 mg at 07/05/15 [id number]  . gabapentin (NEURONTIN) capsule 900 mg  900 mg Oral TID Gonzella Lex, MD   900 mg at 07/05/15 0823  . magnesium hydroxide (MILK OF MAGNESIA) suspension 30 mL  30 mL Oral Daily PRN Gonzella Lex, MD      . menthol-cetylpyridinium (CEPACOL) lozenge 3 mg  1 lozenge Oral PRN Gonzella Lex, MD   3 mg at 07/04/15 1515  . multivitamin with minerals tablet 1 tablet  1 tablet Oral Daily Gonzella Lex, MD   1 tablet at 07/05/15 [id number]  . nystatin (MYCOSTATIN/NYSTOP) topical powder   Topical BID Gonzella Lex, MD      . promethazine (PHENERGAN) tablet 25 mg  25 mg Oral Q8H PRN Hildred Priest, MD      . propranolol (INDERAL) tablet 10 mg  10 mg Oral TID Gonzella Lex, MD   10 mg at 07/05/15 [id number]  . traZODone (DESYREL) tablet 300 mg  300 mg Oral  QHS Gonzella Lex, MD   300 mg at 07/04/15 2235  . venlafaxine XR (EFFEXOR-XR) 24 hr capsule 300 mg  300 mg Oral Q breakfast Gonzella Lex, MD   300 mg at 07/05/15 [id number]    Lab Results:  Results for orders placed or performed during the hospital encounter of 07/03/15 (from the past 48 hour(s))  Lipid panel     Status: Abnormal   Collection Time: 07/05/15  8:15 AM  Result Value Ref Range   Cholesterol 195 0 - 200 mg/dL   Triglycerides 143 <150 mg/dL   HDL 50 >40 mg/dL   Total CHOL/HDL Ratio 3.9 RATIO   VLDL 29 0 - 40 mg/dL   LDL Cholesterol 116 (H) 0 - 99 mg/dL    Comment:        Total Cholesterol/HDL:CHD Risk Coronary Heart Disease Risk Table                     Men   Women  1/2 Average Risk   3.4   3.3  Average Risk       5.0   4.4  2 X Average Risk   9.6   7.1  3 X Average Risk  23.4   11.0        Use the calculated Patient Ratio above and the CHD Risk Table to determine the patient's CHD Risk.        ATP III CLASSIFICATION (LDL):  <100     mg/dL   Optimal  100-129   mg/dL   Near or Above                    Optimal  130-159  mg/dL   Borderline  160-189  mg/dL   High  >190     mg/dL   Very High     Physical Findings: AIMS: Facial and Oral Movements Muscles of Facial Expression: None, normal Lips and Perioral Area: None, normal Jaw: None, normal Tongue: None, normal,Extremity Movements Upper (arms, wrists, hands, fingers): None, normal Lower (legs, knees, ankles, toes): None, normal, Trunk Movements Neck, shoulders, hips: None, normal, Overall Severity Severity of abnormal movements (highest score from questions above): None, normal Incapacitation due to abnormal movements: None, normal Patient's awareness of abnormal movements (rate only patient's report): No Awareness, Dental Status Current problems with teeth and/or dentures?: No Does patient usually wear dentures?: No  CIWA:  CIWA-Ar Total: 7 COWS:  COWS Total Score: 3  Musculoskeletal: Strength & Muscle Tone: decreased Gait & Station: unsteady Patient leans: N/A  Psychiatric Specialty Exam: Review of Systems  HENT: Positive for congestion and sore throat.   Eyes: Negative.   Cardiovascular: Negative.   Gastrointestinal: Negative.   Genitourinary: Negative.   Musculoskeletal: Negative.   Skin: Negative.   Neurological: Positive for weakness. Negative for tremors.  Endo/Heme/Allergies: Negative.   Psychiatric/Behavioral: Positive for depression and substance abuse. Negative for suicidal ideas, hallucinations and memory loss. The patient is not nervous/anxious and does not have insomnia.     Blood pressure 111/58, pulse 69, temperature 98.2 F (36.8 C), temperature source Oral, resp. rate 20, height 5\' 4"  (1.626 m), weight 61.236 kg (135 lb).Body mass index is 23.16 kg/(m^2).  General Appearance: Disheveled  Eye Contact::  Good  Speech:  Clear and Coherent  Volume:  Decreased  Mood:  Anxious and Dysphoric  Affect:  Blunt  Thought Process:  Linear and Logical  Orientation:  Full  (Time, Place, and Person)  Thought Content:  Hallucinations: None  Suicidal Thoughts:  No  Homicidal Thoughts:  No  Memory:  Immediate;   Fair Recent;   Good Remote;   Good  Judgement:  Poor  Insight:  Shallow  Psychomotor Activity:  Decreased  Concentration:  Fair  Recall:  [employer] of Knowledge:Good  Language: Good  Akathisia:  No  Handed:    AIMS (if indicated):     Assets:  Warehouse manager Resources/Insurance  ADL's:  Intact  Cognition: WNL  Sleep:  Number of Hours: 6.75   Treatment Plan Summary: Daily contact with patient to assess and evaluate symptoms and progress in treatment and Medication management   60 year old Caucasian female with history of depression, PTSD and alcoholism presented to our emergency department with alcohol withdrawal. The symptoms were so severe that the patient was transferred to the medical floor. There the patient was treated but continued to voice suicidal ideation and therefore she was transferred to our psychiatric facility. The patient has been in our unit a multitude of times in the past under very similar circumstances.  MAJOR DEPRESSIVE DISORDER: Continue Effexor XR 300 mg by mouth daily  Insomnia: continue trazodone but we will decrease to 150mg  by mouth daily at bedtime  Anxiety: continue Inderal 10 mg by mouth 3 times a day  Alcohol withdrawal: continue Librium 50 mg by mouth 3 times a day.   Nausea: continue Phenergan 25 mg every 8 hours as needed  Chronic pain neuropathic pain: continue Neurontin 900 mg by mouth 3 times a day  Unsteady gait weakness: patient evaluated by physical therapy while on the medical floor. They recommended transfer the patient to a nursing facility for physical therapy. Physical therapy will continue to follow-up the patient was in the hospital.  Nasal congestion and sore throat: Continue lozenges when necessary, order pseudoephedrine twice a day, ordered mouth wash 3 times a day and at  bedtime  Precautions every 15 minute checks  Diet regular  Hospitalization the patient is currently under involuntary commitment  Discharge disposition: once psychiatrically is stable she will be transferred to nursing home facility to continue physical therapy---Plan to d/c to NH on Monday  Discharge follow-up: patient will continue to follow-up with [facility]  Hildred Priest 07/05/2015, 10:49 AM

## 2015-07-05 NOTE — BHH Group Notes (Signed)
[place] Group Notes:  (Nursing/MHT/Case Management/Adjunct)  Date:  07/05/2015  Time:  2:28 PM  Type of Therapy:  Psychoeducational Skills  Participation Level:  Active  Participation Quality:  Appropriate  Affect:  Appropriate  Cognitive:  Appropriate  Insight:  Appropriate  Engagement in Group:  Engaged  Modes of Intervention:  Discussion and Education  Summary of Progress/Problems:  Drake Leach 07/05/2015, 2:28 PM

## 2015-07-05 NOTE — Progress Notes (Signed)
Recreation Therapy Notes  Date: 01.12.17 Time: 3:00 pm Location: Craft Room  Group Topic: Leisure Education/ Coping Skills  Goal Area(s) Addresses:  Patient will identify things they are grateful for. Patient will identify how being grateful can influence decision making.  Behavioral Response: Attentive, Interactive  Intervention: Grateful Wheel  Activity: Patients were given an "I Am Grateful For" worksheet and instructed to write at least one thing they are grateful for under each category.  Education:LRT educated patients on leisure and why it is important.  Education Outcome: In group clarification offered.  Clinical Observations/Feedback: Patient wrote at least one thing she was grateful for under each category. Patient contributed to group discussion by stating things she is grateful for and how being aware of what she is grateful for affects her decision making.  Leonette Monarch, LRT/CTRS 07/05/2015 4:27 PM

## 2015-07-05 NOTE — Progress Notes (Signed)
D: Pt denies SI/HI/AVH. Pt is pleasant and cooperative, affect flat and sad, but brightens upon approach appears less anxious and she is interacting with peers and staff appropriately.  A: Pt was offered support and encouragement. Pt was given scheduled medications. Pt was encouraged to attend groups. Q 15 minute checks were done for safety.  R:Pt attends groups and interacts well with peers and staff. Pt is taking medication. Pt receptive to treatment and safety maintained on unit.

## 2015-07-06 MED ORDER — HYDROGEN PEROXIDE 3 % EX SOLN
Freq: Three times a day (TID) | CUTANEOUS | Status: DC
  Administered 2015-07-06 (×2): via TOPICAL
  Administered 2015-07-06 (×2): 1 via TOPICAL
  Administered 2015-07-07: 09:00:00 via TOPICAL
  Filled 2015-07-06: qty 473

## 2015-07-06 MED ORDER — PSEUDOEPHEDRINE HCL ER 120 MG PO TB12
120.0000 mg | ORAL_TABLET | Freq: Two times a day (BID) | ORAL | Status: DC
  Administered 2015-07-06 – 2015-07-07 (×3): 120 mg via ORAL
  Filled 2015-07-06 (×5): qty 1

## 2015-07-06 NOTE — Progress Notes (Signed)
Physical Therapy Treatment Patient Details Name: [name] MRN: [id number] DOB: [birth year] Today's Date: 07/06/2015    History of Present Illness Pt is a 59 y.o. female presenting to [facility] hospital with alcohol withdrawal and dehydration (pt stopped drinking 5 years ago and recently started drinking a case of beer a day; alcohol level 221 when presenting to hospital).  Pt now discharged to [facility] (on 07/03/15) and new PT consult received.  PMH includes L above elbow arm amputation, R hip surgery, ETOH abuse, neuropathy, anxiety, depression.    PT Comments    Pt ambulated 100 feet with min assist x1 (occasionally mod assist x1 for balance especially with fatigue/distance) and use of SBQC.  Pt progressing with ambulation but limited d/t LE fatigue, balance, and pt tremorous in general with activity.  Pt would benefit from continued skilled therapy to focus on strengthening, balance, gait training, and increasing independence with functional mobility.   Follow Up Recommendations  SNF     Equipment Recommendations   (TBD)    Recommendations for Other Services       Precautions / Restrictions Precautions Precautions: Fall Precaution Comments: CIWA Restrictions Weight Bearing Restrictions: No    Mobility  Bed Mobility Overal bed mobility: Independent Bed Mobility: Supine to Sit;Sit to Supine     Supine to sit: Independent Sit to supine: Independent      Transfers Overall transfer level: Needs assistance Equipment used: None Transfers: Sit to/from [facility] Sit to Stand: Min guard;Min assist (min assist from low bed; CGA from w/c) Stand pivot transfers: Min assist (stand step turn bed to w/c)       General transfer comment: assist for balance once standing  Ambulation/Gait Ambulation/Gait assistance: Min assist Ambulation Distance (Feet): 100 Feet Assistive device: Quad cane   Gait velocity: decreased   General Gait Details: vc's for  quad cane use and stepping pattern initially and then intermittent vc's for stepping pattern with fatigue; pt with increasing unsteadiness with distance (mostly min assist but occasionally mod assist for balance); pt tremorous in general and fatigued with distance   Stairs            Wheelchair Mobility    Modified Rankin (Stroke Patients Only)       Balance Overall balance assessment: Needs assistance Sitting-balance support: No upper extremity supported;Feet supported Sitting balance-Leahy Scale: Normal     Standing balance support: Single extremity supported (on quad cane) Standing balance-Leahy Scale: Fair Standing balance comment: tremorous in general                    Cognition Arousal/Alertness: Awake/alert Behavior During Therapy: WFL for tasks assessed/performed Overall Cognitive Status: Within Functional Limits for tasks assessed                      Exercises Total Joint Exercises Ankle Circles/Pumps: AROM;Both;10 reps Quad Sets: AROM;Both;10 reps Gluteal Sets: AROM;Both;10 reps Short Arc Quad: AROM;Both;10 reps Heel Slides: AROM;Both;10 reps Hip ABduction/ADduction: AROM;Both;10 reps    General Comments General comments (skin integrity, edema, etc.): pt tremorous with activity  Nursing cleared pt for participation in physical therapy.  Pt agreeable to PT session.      Pertinent Vitals/Pain Pain Assessment: No/denies pain  Vitals stable and WFL throughout treatment session.    Home Living                      Prior Function  PT Goals (current goals can now be found in the care plan section) Acute Rehab PT Goals Patient Stated Goal: to be able to walk again independently PT Goal Formulation: With patient Time For Goal Achievement: 07/18/15 Potential to Achieve Goals: Good Progress towards PT goals: Progressing toward goals    Frequency  Min 2X/week    PT Plan Current plan remains appropriate     Co-evaluation             End of Session Equipment Utilized During Treatment: Gait belt Activity Tolerance: Patient limited by fatigue Patient left:  (in w/c at group)     Time: 1310-1335 PT Time Calculation (min) (ACUTE ONLY): 25 min  Charges:  $Gait Training: 8-22 mins $Therapeutic Exercise: 8-22 mins                    G CodesLeitha Bleak 2015/07/22, 1:58 PM Leitha Bleak, [place]

## 2015-07-06 NOTE — Progress Notes (Signed)
Recreation Therapy Notes  Date: 01.13.17 Time: 3:15 pm Location: Craft Room  Group Topic: Communication, Problem Solving, Teamwork  Goal Area(s) Addresses:  Patient will effectively work with peer towards shared goal. Patient will identify skills used to make activity successful. Patient will identify benefit of using group skills effectively post d/c.  Behavioral Response: Attentive, Interactive  Intervention: [employer]  Activity: Patients were given 15 pipe cleaners and instructed to build a free standing tower. After about 6 minutes of them building, patients were instructed to put their dominant hand behind their backs.  Education: LRT educated patients on how they can use these skills outside of the hospital.  Education Outcome: Acknowledges education/In group clarification offered   Clinical Observations/Feedback: Patient completed activity by working with peers to build tower. Patient used communication, problem solving, and teamwork. Patient contributed to group discussion by stating that her team worked together effectively and how they used communication, problem solving, and Engineer, drilling.  Leonette Monarch, LRT/CTRS 07/06/2015 4:29 PM

## 2015-07-06 NOTE — BHH Group Notes (Signed)
[facility] LCSW Aftercare Discharge Planning Group Note   07/06/2015 11:48 AM  Participation Quality: Did not attend.   [place] MSW, LCSWA

## 2015-07-06 NOTE — Progress Notes (Signed)
[facility] MD Progress Note  07/06/2015 11:30 AM KENYETTE SANFT  MRN:  [id number] Subjective:  Patient reports doing better as far as withdrawals. She complained of not feeling good this morning due to nasal congestion and sore throat.  Patient states that mood wise she is a little better but still depressed. She is no longer voicing suicidal thoughts. States that she is trying to participate in programming as much as she can, however she still very weak.  Reports decreased energy, decreased appetite, poor concentration, depressed mood. Denies side effects from medications. Denies other physical complaints.  Per nursing: D: Pt denies SI/HI/AVH. Pt is pleasant and cooperative, affect is flat and sad appears less anxious and she is interacting with peers and staff appropriately.  A: Pt was offered support and encouragement. Pt was given scheduled medications. Pt was encouraged to attend groups. Q 15 minute checks were done for safety.  R:Pt attends groups and interacts well with peers and staff. Pt is taking medication. Pt has no complaints.Pt receptive to treatment and safety maintained on unit.  Principal Problem: Severe recurrent major depression without psychotic features ([place]) Diagnosis:   Patient Active Problem List   Diagnosis Date Noted  . Severe recurrent major depression without psychotic features ([place]) [F33.2] 07/03/2015  . Alcohol use disorder, severe, dependence ([place]) [F10.20] 01/26/2015  . Tobacco use disorder [F17.200] 01/26/2015  . Alcohol withdrawal ([place]) [F10.239] 01/08/2015  . 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 GAD (generalized anxiety disorder) [F41.1] 12/24/2013  . PTSD (post-traumatic stress disorder) [F43.10] 12/22/2013   Total Time spent with patient: 30 minutes   Sleep: Good  Appetite:  [place]   Past Psychiatric History: This is her 5th admission to our unit since December 2015. Currently f/u with [facility]. Long history of alcohol  abuse, depression, and anxiety. Has had some suicide attempts in the past but mostly also by substance abuse. She has had hospitalizations for detoxification and hospitalizations for stabilization of mood and anxiety. She is currently taking venlafaxine extended release 300 mg a day which had been helpful for her anxiety and depression. She has taken other medicines in the past as well, which she found less helpful. Patient has been hospitalized before, she has been at [facility], that was her first hospitalization at the age of 83. She states that she was diagnosed with borderline personality disorder and was prescribed with lithium, BuSpar, and amitriptyline. She was also hospitalized in [place] where she was diagnosed with bipolar disorder. She has been in inpatient rehabilitation. The patient denies any history of suicidal attempts in the past and denies any history of self-injurious behaviors.   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 She is currently taking Neurontin 900 mg 3 times a day. She denies any history of seizures or head trauma. No other medical problems were reported. Past Medical History  Diagnosis Date  . Depression   . Anxiety   . Neuropathy ([place])   . Insomnia   . ETOH abuse     Past Surgical History  Procedure Laterality Date  . Cesarean section    . Joint replacement Right   . 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 Abdominal hysterectomy    . Right hip surgery     Family History:  Family History  Problem Relation Age of Onset  . COPD Mother   . Alcoholism Father   . COPD Father  Family Psychiatric History:The patient reports that sister, mother, aunt, and cousin have been diagnosed with borderline personality disorder and bipolar disorder. She said that one of her sisters abuses alcohol and there is no family history of suicide.   Social History:  Not working. Lives by herself in an apartment. Has adult children with whom she is an occasional contact. Pretty much estranged from all the rest of her family. She says when she is sober she does have social contact with people she enjoys spending time with. She receives disability. She has two adult children who are in their 95's.She denies any history of legal problems. In terms of education she reports having an associate degree. History  Alcohol Use  . 14.4 oz/week  . 24 Cans of beer per week    Comment: 1 case per day    History  Drug Use No    Comment: Patient denies     Social History   Social History  . Marital Status: Divorced    Spouse Name: N/A  . Number of Children: N/A  . Years of Education: N/A   Social History Main Topics  . Smoking status: Current Every Day Smoker -- 0.50 packs/day    Types: Cigarettes  . Smokeless tobacco: Never Used  . Alcohol Use: 14.4 oz/week    24 Cans of beer per week     Comment: 1 case per day  . Drug Use: No     Comment: Patient denies   . Sexual Activity: Yes   Other Topics Concern  . None   Social History Narrative    Allergies:  Allergies  Allergen Reactions  . Benadryl [Diphenhydramine] Hives and Other (See Comments)    Reaction: Hyperactivity   . Cephalosporins Hives  . Codeine Nausea And Vomiting         Current Medications: Current Facility-Administered Medications  Medication Dose Route Frequency Provider Last Rate Last Dose  . acetaminophen (TYLENOL) tablet 650 mg  650 mg Oral Q6H PRN Gonzella Lex, MD   650 mg at 07/06/15 1110  . alum & mag hydroxide-simeth (MAALOX/MYLANTA) 200-200-20 MG/5ML suspension 30 mL  30 mL Oral Q4H PRN Gonzella Lex, MD      . chlordiazePOXIDE (LIBRIUM) capsule 50 mg  50 mg Oral TID Hildred Priest, MD   50 mg at 07/06/15 0816  . gabapentin (NEURONTIN) capsule 900 mg  900 mg Oral  TID WC Hildred Priest, MD   900 mg at 07/06/15 0816  . hydrogen peroxide 3 % external solution   Topical TID PC & HS Hildred Priest, MD   1 application at [phone]  . magnesium hydroxide (MILK OF MAGNESIA) suspension 30 mL  30 mL Oral Daily PRN Gonzella Lex, MD      . menthol-cetylpyridinium (CEPACOL) lozenge 3 mg  1 lozenge Oral PRN Gonzella Lex, MD   3 mg at 07/04/15 1515  . multivitamin with minerals tablet 1 tablet  1 tablet Oral Daily Gonzella Lex, MD   1 tablet at 07/06/15 0816  . nystatin (MYCOSTATIN/NYSTOP) topical powder   Topical BID Gonzella Lex, MD      . promethazine (PHENERGAN) tablet 25 mg  25 mg Oral Q8H PRN Hildred Priest, MD   25 mg at 07/05/15 1544  . propranolol (INDERAL) tablet 10 mg  10 mg Oral TID WC Hildred Priest, MD   10 mg at 07/06/15 0815  . pseudoephedrine (SUDAFED) 12 hr tablet 120 mg  120 mg Oral BID Seth Bake  Hernandez-Gonzalez, MD   120 mg at 07/06/15 0900  . traZODone (DESYREL) tablet 150 mg  150 mg Oral QHS Hildred Priest, MD   150 mg at 07/05/15 2133  . venlafaxine XR (EFFEXOR-XR) 24 hr capsule 300 mg  300 mg Oral Q breakfast Gonzella Lex, MD   300 mg at 07/06/15 [id number]    Lab Results:  Results for orders placed or performed during the hospital encounter of 07/03/15 (from the past 48 hour(s))  Lipid panel     Status: Abnormal   Collection Time: 07/05/15  8:15 AM  Result Value Ref Range   Cholesterol 195 0 - 200 mg/dL   Triglycerides 143 <150 mg/dL   HDL 50 >40 mg/dL   Total CHOL/HDL Ratio 3.9 RATIO   VLDL 29 0 - 40 mg/dL   LDL Cholesterol 116 (H) 0 - 99 mg/dL    Comment:        Total Cholesterol/HDL:CHD Risk Coronary Heart Disease Risk Table                     Men   Women  1/2 Average Risk   3.4   3.3  Average Risk       5.0   4.4  2 X Average Risk   9.6   7.1  3 X Average Risk  23.4   11.0        Use the calculated Patient Ratio above and the CHD Risk Table to determine the  patient's CHD Risk.        ATP III CLASSIFICATION (LDL):  <100     mg/dL   Optimal  100-129  mg/dL   Near or Above                    Optimal  130-159  mg/dL   Borderline  160-189  mg/dL   High  >190     mg/dL   Very High     Physical Findings: AIMS: Facial and Oral Movements Muscles of Facial Expression: None, normal Lips and Perioral Area: None, normal Jaw: None, normal Tongue: None, normal,Extremity Movements Upper (arms, wrists, hands, fingers): None, normal Lower (legs, knees, ankles, toes): None, normal, Trunk Movements Neck, shoulders, hips: None, normal, Overall Severity Severity of abnormal movements (highest score from questions above): None, normal Incapacitation due to abnormal movements: None, normal Patient's awareness of abnormal movements (rate only patient's report): No Awareness, Dental Status Current problems with teeth and/or dentures?: No Does patient usually wear dentures?: No  CIWA:  CIWA-Ar Total: 7 COWS:  COWS Total Score: 3  Musculoskeletal: Strength & Muscle Tone: decreased Gait & Station: unsteady Patient leans: N/A  Psychiatric Specialty Exam: Review of Systems  HENT: Positive for congestion and sore throat.   Eyes: Negative.   Cardiovascular: Negative.   Gastrointestinal: Negative.   Genitourinary: Negative.   Musculoskeletal: Negative.   Skin: Negative.   Neurological: Positive for weakness. Negative for tremors.  Endo/Heme/Allergies: Negative.   Psychiatric/Behavioral: Positive for depression and substance abuse. Negative for suicidal ideas, hallucinations and memory loss. The patient is not nervous/anxious and does not have insomnia.     Blood pressure 137/75, pulse 91, temperature 98.2 F (36.8 C), temperature source Oral, resp. rate 20, height 5\' 4"  (1.626 m), weight 61.236 kg (135 lb).Body mass index is 23.16 kg/(m^2).  General Appearance: Disheveled  Eye Contact::  Good  Speech:  Clear and Coherent  Volume:  Decreased  Mood:   Anxious and Dysphoric  Affect:  Blunt  Thought Process:  Linear and Logical  Orientation:  Full (Time, Place, and Person)  Thought Content:  Hallucinations: None  Suicidal Thoughts:  No  Homicidal Thoughts:  No  Memory:  Immediate;   Fair Recent;   Good Remote;   Good  Judgement:  Poor  Insight:  Shallow  Psychomotor Activity:  Decreased  Concentration:  Fair  Recall:  [employer] of Knowledge:Good  Language: Good  Akathisia:  No  Handed:    AIMS (if indicated):     Assets:  Warehouse manager Resources/Insurance  ADL's:  Intact  Cognition: WNL  Sleep:  Number of Hours: 8   Treatment Plan Summary: Daily contact with patient to assess and evaluate symptoms and progress in treatment and Medication management   60 year old Caucasian female with history of depression, PTSD and alcoholism presented to our emergency department with alcohol withdrawal. The symptoms were so severe that the patient was transferred to the medical floor. There the patient was treated but continued to voice suicidal ideation and therefore she was transferred to our psychiatric facility. The patient has been in our unit a multitude of times in the past under very similar circumstances.  MAJOR DEPRESSIVE DISORDER: Continue Effexor XR 300 mg by mouth daily  Insomnia: continue trazodone 150mg  by mouth daily at bedtime  Anxiety: continue Inderal 10 mg by mouth 3 times a day  Alcohol withdrawal: continue Librium 50 mg by mouth 3 times a day.   Nausea: continue Phenergan 25 mg every 8 hours as needed  Chronic pain neuropathic pain: continue Neurontin 900 mg by mouth 3 times a day  Unsteady gait weakness: patient evaluated by physical therapy while on the medical floor. They recommended transfer the patient to a nursing facility for physical therapy. Physical therapy will continue to follow-up the patient was in the hospital.  Nasal congestion and sore throat: Continue lozenges when necessary,   pseudoephedrine twice a day, and mouth wash 3 times a day and at bedtime---No fever  Precautions every 15 minute checks  Diet regular  Hospitalization the patient is currently under involuntary commitment  Discharge disposition: once psychiatrically is stable she will be transferred to nursing home facility to continue physical therapy---Plan to d/c to NH on Monday  Discharge follow-up: patient will continue to follow-up with [facility]  Hildred Priest 07/06/2015, 11:30 AM

## 2015-07-06 NOTE — Plan of Care (Signed)
Problem: [facility] Participation in Recreation Therapeutic Interventions Goal: STG-Patient will demonstrate improved self esteem by identif STG: Self-Esteem - Within 4 treatment sessions, patient will verbalize at least 5 positive affirmation statements in each of 2 treatment sessions to increase self-esteem post d/c.  Outcome: Progressing Treatment Session 1; Completed 1 out of 2: At approximately 3:55 pm, LRT met with patient in craft room. Patient verbalized 5 positive affirmation statements. Patient reported it felt "pretty good". LRT encouraged patient to continue saying positive affirmation statements. Intervention Used: I Am statements  Leonette Monarch, LRT/CTRS 01.13.17 4:37 pm Goal: STG-Patient will identify at least five coping skills for ** STG: Coping Skills - Within 4 treatment sessions, patient will verbalize at least 5 coping skills for substance abuse in each of 2 treatment sessions to decrease substance abuse post d/c.  Outcome: Progressing Treatment Session 1; Completed 1 out of 2: At approximately 3:55 pm, LRT met with patient in patient room. Patient verbalized 5 coping skills for substance abuse. LRT educated patient on leisure and why it is important to participate in it. Intervention Used: Coping Skills worksheet  Leonette Monarch, LRT/CTRS 01.13.17 4:38 pm Goal: STG-Other Recreation Therapy Goal (Specify) STG: Stress Management - Within 4 treatment sessions, patient will verbalize understanding of the stress management techniques in each of 2 treatment sessions to increase stress management skills post d/c.  Outcome: Progressing Treatment Session 1; Completed 1 out of 2: At approximately 3:55 pm, LRT met with patient in craft room. LRT educated and provided patient with handouts on stress management techniques. Patient verbalized understanding. LRT encouraged patient to read over and practice the stress management techniques. Intervention Used: Stress Management  handouts  Leonette Monarch, LRT/CTRS 01.13.17 4:40 pm

## 2015-07-06 NOTE — Plan of Care (Signed)
Problem: Ineffective individual coping Goal: LTG: Patient will report a decrease in negative feelings Outcome: Progressing Patient reports less depression today

## 2015-07-06 NOTE — Progress Notes (Signed)
D: Pt denies SI/HI/AVH. Pt is pleasant and cooperative, affect is flat and sad appears less anxious and she is interacting with peers and staff appropriately.  A: Pt was offered support and encouragement. Pt was given scheduled medications. Pt was encouraged to attend groups. Q 15 minute checks were done for safety.  R:Pt attends groups and interacts well with peers and staff. Pt is taking medication. Pt has no complaints.Pt receptive to treatment and safety maintained on unit.

## 2015-07-06 NOTE — BHH Group Notes (Signed)
[place] Group Notes:  (Nursing/MHT/Case Management/Adjunct)  Date:  07/06/2015  Time:  1:02 PM  Type of Therapy:  Movement Therapy  Participation Level:  Active  Participation Quality:  Appropriate  Affect:  Appropriate  Cognitive:  Alert, Appropriate and Oriented  Insight:  Appropriate  Engagement in Group:  Engaged  Modes of Intervention:  Activity  Summary of Progress/Problems:  [name] 07/06/2015, 1:02 PM

## 2015-07-06 NOTE — Progress Notes (Signed)
D:  Patient is alert and oriented on the unit this shift.  Patient's mood is depressed and mildly anxious.  Patient complained of a sore throat and sinus congestion and returned to bed after breakfast and medications.  Patient did attend recreational group today where she sat outside and chatted with peers.  Patient denies suicidal ideation, homicidal ideation, auditory or visual hallucinations at the current time.   A:  Scheduled medications were administered to patient per MD orders.  Emotional support and encouragement were provided.  Patient was maintained on q.15 minute safety checks.  Patient was informed to notify staff with any questions or concerns.   R:  No adverse medication reactions were noted.  Patient was cooperative with medication administration.  Patient interacts well with others on the unit.  Patient contracts for safety on the unit today.  Patient remains safe at this time.

## 2015-07-06 NOTE — Plan of Care (Signed)
Problem: Alteration in mood & ability to function due to Goal: LTG-Pt verbalizes understanding of importance of med regimen (Patient verbalizes understanding of importance of medication regimen and need to continue outpatient care and support groups)  Outcome: Progressing Patient complaint with medication regimen.

## 2015-07-07 MED ORDER — IBUPROFEN 800 MG PO TABS
400.0000 mg | ORAL_TABLET | Freq: Three times a day (TID) | ORAL | Status: AC
  Administered 2015-07-07 – 2015-07-08 (×3): 400 mg via ORAL
  Filled 2015-07-07 (×3): qty 1

## 2015-07-07 MED ORDER — AZITHROMYCIN 250 MG PO TABS
250.0000 mg | ORAL_TABLET | Freq: Every day | ORAL | Status: DC
  Administered 2015-07-08 – 2015-07-09 (×2): 250 mg via ORAL
  Filled 2015-07-07 (×2): qty 1

## 2015-07-07 MED ORDER — AZITHROMYCIN 250 MG PO TABS
500.0000 mg | ORAL_TABLET | Freq: Every day | ORAL | Status: AC
  Administered 2015-07-07: 500 mg via ORAL
  Filled 2015-07-07: qty 2

## 2015-07-07 NOTE — BHH Group Notes (Signed)
[place] Group Notes:  (Nursing/MHT/Case Management/Adjunct)  Date:  07/07/2015  Time:  10:50 PM  Type of Therapy:  Group Therapy  Participation Level:  Active  Participation Quality:  Appropriate  Affect:  Appropriate  Cognitive:  Appropriate  Insight:  Appropriate  Engagement in Group:  Engaged  Modes of Intervention:  Support  Summary of Progress/Problems:  [name] 07/07/2015, 10:50 PM

## 2015-07-07 NOTE — NC FL2 (Signed)
[place] LEVEL OF CARE SCREENING TOOL     IDENTIFICATION  Patient Name: [name] Birthdate: [birth year] Sex: female Admission Date (Current Location): 07/03/2015  [place] and [place] Number:  Selena Lesser [id number] [place] and Address:  [facility], [address] [place],  [address]      Provider Number: [id number]  Attending Physician Name and Address:  Golden Hurter*  Relative Name and Phone Number:       Current Level of Care: Hospital Recommended Level of Care: [place] Prior Approval Number:    Date Approved/Denied:   PASRR Number: [id number]  Discharge Plan: SNF    Current Diagnoses: Patient Active Problem List   Diagnosis Date Noted  . Severe recurrent major depression without psychotic features ([place]) 07/03/2015  . Alcohol use disorder, severe, dependence ([place]) 01/26/2015  . Tobacco use disorder 01/26/2015  . Alcohol withdrawal ([place]) 01/08/2015  . 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 GAD (generalized anxiety disorder) 12/24/2013  . PTSD (post-traumatic stress disorder) 12/22/2013    Orientation RESPIRATION BLADDER Height & Weight    Self, Time, Situation, Place  Normal Continent 5\' 4"  (162.6 cm) 135 lbs.  BEHAVIORAL SYMPTOMS/MOOD NEUROLOGICAL BOWEL NUTRITION STATUS   (Isolating)   Continent Diet (Regular)  AMBULATORY STATUS COMMUNICATION OF NEEDS Skin   Limited Assist Verbally Normal                       Personal Care Assistance Level of Assistance  Bathing, Feeding, Dressing, Total care Bathing Assistance: Limited assistance Feeding assistance: Independent Dressing Assistance: Limited assistance Total Care Assistance: Limited assistance   Functional Limitations Info  Sight, Hearing, Speech Sight Info: Adequate Hearing Info: Adequate Speech Info: Adequate    SPECIAL CARE FACTORS FREQUENCY  PT (By licensed PT)     PT Frequency:  5              Contractures Contractures Info: Not present    Additional Factors Info  Allergies, Code Status Code Status Info: Full code  Allergies Info: Benadryl, Cephalosporins, Codeine           Current Medications (07/07/2015):  This is the current hospital active medication list Current Facility-Administered Medications  Medication Dose Route Frequency Provider Last Rate Last Dose  . acetaminophen (TYLENOL) tablet 650 mg  650 mg Oral Q6H PRN Gonzella Lex, MD   650 mg at 07/06/15 1110  . alum & mag hydroxide-simeth (MAALOX/MYLANTA) 200-200-20 MG/5ML suspension 30 mL  30 mL Oral Q4H PRN Gonzella Lex, MD      . chlordiazePOXIDE (LIBRIUM) capsule 50 mg  50 mg Oral TID Hildred Priest, MD   50 mg at 07/07/15 0837  . gabapentin (NEURONTIN) capsule 900 mg  900 mg Oral TID WC Hildred Priest, MD   900 mg at 07/07/15 0836  . hydrogen peroxide 3 % external solution   Topical TID PC & HS Hildred Priest, MD      . magnesium hydroxide (MILK OF MAGNESIA) suspension 30 mL  30 mL Oral Daily PRN Gonzella Lex, MD      . menthol-cetylpyridinium (CEPACOL) lozenge 3 mg  1 lozenge Oral PRN Gonzella Lex, MD   3 mg at 07/07/15 0841  . multivitamin with minerals tablet 1 tablet  1 tablet Oral Daily Gonzella Lex, MD   1 tablet at 07/07/15 0837  . nystatin (MYCOSTATIN/NYSTOP) topical powder   Topical BID Gonzella Lex, MD      .  promethazine (PHENERGAN) tablet 25 mg  25 mg Oral Q8H PRN Hildred Priest, MD   25 mg at 07/05/15 1544  . propranolol (INDERAL) tablet 10 mg  10 mg Oral TID WC Hildred Priest, MD   10 mg at 07/07/15 0836  . pseudoephedrine (SUDAFED) 12 hr tablet 120 mg  120 mg Oral BID Hildred Priest, MD   120 mg at 07/07/15 0837  . traZODone (DESYREL) tablet 150 mg  150 mg Oral QHS Hildred Priest, MD   150 mg at 07/06/15 2121  . venlafaxine XR (EFFEXOR-XR) 24 hr capsule 300 mg  300 mg Oral Q breakfast Gonzella Lex, MD   300 mg at 07/07/15 [id number]     Discharge Medications: Please see discharge summary for a list of discharge medications.  Relevant Imaging Results:  Relevant Lab Results:   Additional Information SSN:  [id number]  [place], LCSWA

## 2015-07-07 NOTE — Progress Notes (Signed)
[facility] MD Progress Note  07/07/2015 1:18 PM [name]  MRN:  [id number] Subjective:  Patient reports doing better as far as withdrawals. She complained of not feeling good this morning due to nasal congestion and sore throat.  He states that they're sort throat is not getting any better and is actually worse. Says that she's been having the sore throat since she was admitted to the medical floor. On examination there is significant erythema and a small areas with pus.  Patient states that mood wise she is a little better but still depressed. She is no longer voicing suicidal thoughts. States that she is trying to participate in programming as much as she can, however she still very weak.  Reports decreased energy, decreased appetite, poor concentration, depressed mood. Denies side effects from medications. Denies other physical complaints.  Per nursing: Rates depression as a 7/10. Denies SI. Affect flat. Utilizes wheelchair. Medication and group compliant. Visible in Milieu. Interacting with peers and staff appropriately. Support and encouragement offered. Safety maintained.   Principal Problem: Severe recurrent major depression without psychotic features ([place]) Diagnosis:   Patient Active Problem List   Diagnosis Date Noted  . Severe recurrent major depression without psychotic features ([place]) [F33.2] 07/03/2015  . Alcohol use disorder, severe, dependence ([place]) [F10.20] 01/26/2015  . Tobacco use disorder [F17.200] 01/26/2015  . Alcohol withdrawal ([place]) [F10.239] 01/08/2015  . 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 GAD (generalized anxiety disorder) [F41.1] 12/24/2013  . PTSD (post-traumatic stress disorder) [F43.10] 12/22/2013   Total Time spent with patient: 30 minutes   Sleep: Good  Appetite:  [place]   Past Psychiatric History: This is her 5th admission to our unit since December 2015. Currently f/u with [facility]. Long history of alcohol  abuse, depression, and anxiety. Has had some suicide attempts in the past but mostly also by substance abuse. She has had hospitalizations for detoxification and hospitalizations for stabilization of mood and anxiety. She is currently taking venlafaxine extended release 300 mg a day which had been helpful for her anxiety and depression. She has taken other medicines in the past as well, which she found less helpful. Patient has been hospitalized before, she has been at [facility], that was her first hospitalization at the age of 50. She states that she was diagnosed with borderline personality disorder and was prescribed with lithium, BuSpar, and amitriptyline. She was also hospitalized in [place] where she was diagnosed with bipolar disorder. She has been in inpatient rehabilitation. The patient denies any history of suicidal attempts in the past and denies any history of self-injurious behaviors.   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 She is currently taking Neurontin 900 mg 3 times a day. She denies any history of seizures or head trauma. No other medical problems were reported. Past Medical History  Diagnosis Date  . Depression   . Anxiety   . Neuropathy ([place])   . Insomnia   . ETOH abuse     Past Surgical History  Procedure Laterality Date  . Cesarean section    . Joint replacement Right   . 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 Abdominal hysterectomy    . Right hip surgery     Family History:  Family History  Problem Relation Age of Onset  . COPD Mother   . Alcoholism Father   . COPD Father    Family Psychiatric History:The  patient reports that sister, mother, aunt, and cousin have been diagnosed with borderline personality disorder and bipolar disorder. She said that one of her sisters abuses alcohol and there is no family history of suicide.   Social History:  Not working. Lives by herself in an apartment. Has adult children with whom she is an occasional contact. Pretty much estranged from all the rest of her family. She says when she is sober she does have social contact with people she enjoys spending time with. She receives disability. She has two adult children who are in their 77's.She denies any history of legal problems. In terms of education she reports having an associate degree. History  Alcohol Use  . 14.4 oz/week  . 24 Cans of beer per week    Comment: 1 case per day    History  Drug Use No    Comment: Patient denies     Social History   Social History  . Marital Status: Divorced    Spouse Name: N/A  . Number of Children: N/A  . Years of Education: N/A   Social History Main Topics  . Smoking status: Current Every Day Smoker -- 0.50 packs/day    Types: Cigarettes  . Smokeless tobacco: Never Used  . Alcohol Use: 14.4 oz/week    24 Cans of beer per week     Comment: 1 case per day  . Drug Use: No     Comment: Patient denies   . Sexual Activity: Yes   Other Topics Concern  . None   Social History Narrative    Allergies:  Allergies  Allergen Reactions  . Benadryl [Diphenhydramine] Hives and Other (See Comments)    Reaction: Hyperactivity   . Cephalosporins Hives  . Codeine Nausea And Vomiting         Current Medications: Current Facility-Administered Medications  Medication Dose Route Frequency Provider Last Rate Last Dose  . acetaminophen (TYLENOL) tablet 650 mg  650 mg Oral Q6H PRN Gonzella Lex, MD   650 mg at 07/06/15 1110  . alum & mag hydroxide-simeth (MAALOX/MYLANTA) 200-200-20 MG/5ML suspension 30 mL  30 mL Oral Q4H PRN Gonzella Lex, MD      . azithromycin (ZITHROMAX) tablet 500 mg  500 mg Oral Daily Hildred Priest, MD       Followed by  . [START ON 07/08/2015] azithromycin (ZITHROMAX) tablet 250  mg  250 mg Oral Daily Hildred Priest, MD      . chlordiazePOXIDE (LIBRIUM) capsule 50 mg  50 mg Oral TID Hildred Priest, MD   50 mg at 07/07/15 0837  . gabapentin (NEURONTIN) capsule 900 mg  900 mg Oral TID WC Hildred Priest, MD   900 mg at 07/07/15 1200  . hydrogen peroxide 3 % external solution   Topical TID PC & HS Hildred Priest, MD      . ibuprofen (ADVIL,MOTRIN) tablet 400 mg  400 mg Oral TID Hildred Priest, MD      . magnesium hydroxide (MILK OF MAGNESIA) suspension 30 mL  30 mL Oral Daily PRN Gonzella Lex, MD      . menthol-cetylpyridinium (CEPACOL) lozenge 3 mg  1 lozenge Oral PRN Gonzella Lex, MD   3 mg at 07/07/15 0841  . multivitamin with minerals tablet 1 tablet  1 tablet Oral Daily Gonzella Lex, MD   1 tablet at 07/07/15 0837  . nystatin (MYCOSTATIN/NYSTOP) topical powder   Topical BID Gonzella Lex, MD      . promethazine (  PHENERGAN) tablet 25 mg  25 mg Oral Q8H PRN Hildred Priest, MD   25 mg at 07/05/15 1544  . propranolol (INDERAL) tablet 10 mg  10 mg Oral TID WC Hildred Priest, MD   10 mg at 07/07/15 1200  . pseudoephedrine (SUDAFED) 12 hr tablet 120 mg  120 mg Oral BID Hildred Priest, MD   120 mg at 07/07/15 0837  . traZODone (DESYREL) tablet 150 mg  150 mg Oral QHS Hildred Priest, MD   150 mg at 07/06/15 2121  . venlafaxine XR (EFFEXOR-XR) 24 hr capsule 300 mg  300 mg Oral Q breakfast Gonzella Lex, MD   300 mg at 07/07/15 [id number]    Lab Results:  No results found for this or any previous visit (from the past 48 hour(s)).  Physical Findings: AIMS: Facial and Oral Movements Muscles of Facial Expression: None, normal Lips and Perioral Area: None, normal Jaw: None, normal Tongue: None, normal,Extremity Movements Upper (arms, wrists, hands, fingers): None, normal Lower (legs, knees, ankles, toes): None, normal, Trunk Movements Neck, shoulders, hips: None, normal, Overall  Severity Severity of abnormal movements (highest score from questions above): None, normal Incapacitation due to abnormal movements: None, normal Patient's awareness of abnormal movements (rate only patient's report): No Awareness, Dental Status Current problems with teeth and/or dentures?: No Does patient usually wear dentures?: No  CIWA:  CIWA-Ar Total: 7 COWS:  COWS Total Score: 3  Musculoskeletal: Strength & Muscle Tone: decreased Gait & Station: unsteady Patient leans: N/A  Psychiatric Specialty Exam: Review of Systems  HENT: Positive for congestion and sore throat.   Eyes: Negative.   Cardiovascular: Negative.   Gastrointestinal: Negative.   Genitourinary: Negative.   Musculoskeletal: Negative.   Skin: Negative.   Neurological: Positive for weakness. Negative for tremors.  Endo/Heme/Allergies: Negative.   Psychiatric/Behavioral: Positive for depression and substance abuse. Negative for suicidal ideas, hallucinations and memory loss. The patient is not nervous/anxious and does not have insomnia.     Blood pressure 133/77, pulse 106, temperature 98 F (36.7 C), temperature source Oral, resp. rate 20, height 5\' 4"  (1.626 m), weight 61.236 kg (135 lb).Body mass index is 23.16 kg/(m^2).  General Appearance: Disheveled  Eye Contact::  Good  Speech:  Clear and Coherent  Volume:  Decreased  Mood:  Anxious and Dysphoric  Affect:  Blunt  Thought Process:  Linear and Logical  Orientation:  Full (Time, Place, and Person)  Thought Content:  Hallucinations: None  Suicidal Thoughts:  No  Homicidal Thoughts:  No  Memory:  Immediate;   Fair Recent;   Good Remote;   Good  Judgement:  Poor  Insight:  Shallow  Psychomotor Activity:  Decreased  Concentration:  Fair  Recall:  [employer] of Knowledge:Good  Language: Good  Akathisia:  No  Handed:    AIMS (if indicated):     Assets:  Warehouse manager Resources/Insurance  ADL's:  Intact  Cognition: WNL  Sleep:   Number of Hours: 7.75   Treatment Plan Summary: Daily contact with patient to assess and evaluate symptoms and progress in treatment and Medication management   60 year old Caucasian female with history of depression, PTSD and alcoholism presented to our emergency department with alcohol withdrawal. The symptoms were so severe that the patient was transferred to the medical floor. There the patient was treated but continued to voice suicidal ideation and therefore she was transferred to our psychiatric facility. The patient has been in our unit a multitude of times in the past under  very similar circumstances.  MAJOR DEPRESSIVE DISORDER: Continue Effexor XR 300 mg by mouth daily  Insomnia: continue trazodone 150mg  by mouth daily at bedtime  Anxiety: continue Inderal 10 mg by mouth 3 times a day  Alcohol withdrawal: continue Librium 50 mg by mouth 3 times a day.  will decrease to 25 3 times a day tomorrow   Nausea: continue Phenergan 25 mg every 8 hours as needed. No longer voicing nausea   Chronic pain neuropathic pain: continue Neurontin 900 mg by mouth 3 times a day  Unsteady gait weakness: patient evaluated by physical therapy while on the medical floor. They recommended transfer the patient to a nursing facility for physical therapy. Physical therapy will continue to follow-up the patient was in the hospital.  Nasal congestion and sore throat: Continue lozenges when necessary,  pseudoephedrine twice a day, and mouth wash 3 times a day and at bedtime--- no improvement patient is slightly having pharyngitis I will restart her on Zpac today   Precautions every 15 minute checks  Diet regular  Hospitalization the patient is currently under involuntary commitment  Discharge disposition: once psychiatrically is stable she will be transferred to nursing home facility to continue physical therapy---Plan to d/c to NH on Monday  Discharge follow-up: patient will continue to follow-up with  [facility]  Hildred Priest 07/07/2015, 1:18 PM

## 2015-07-07 NOTE — Plan of Care (Signed)
Problem: Alteration in mood & ability to function due to Goal: LTG-Patient demonstrates decreased signs of withdrawal (Patient demonstrates decreased signs of withdrawal to the point the patient is safe to return home and continue treatment in an outpatient setting)  Outcome: Progressing Patient interacting appropriately with peers.

## 2015-07-07 NOTE — Progress Notes (Signed)
Rates depression as a 7/10.  Denies SI.  Affect flat.  Utilizes wheelchair.  Medication and group compliant.  Visible in  Milieu.  Interacting with peers and staff appropriately.  Support and encouragement offered.  Safety maintained.

## 2015-07-07 NOTE — [facility] Group Notes (Signed)
[place] LCSW Group Therapy  07/07/2015 3:58 PM  Type of Therapy:  Group Therapy  Participation Level:  Active  Participation Quality:  Attentive  Affect:  Appropriate  Cognitive:  Alert  Insight:  Improving  Engagement in Therapy:  Improving  Modes of Intervention:  Discussion, Education, Socialization and Support  Summary of Progress/Problems: Pt will identify unhealthy thoughts and how they impact their emotions and behavior. Pt will be encouraged to discuss these thoughts, emotions and behaviors with the group. Oline attended group and stayed the entire time. She discussed feeling scared about her living situation. She is unsure where she will be living after she is discharged from the skilled nursing facility. Also, she discussed the need to forgiver herself after this last relapse.   [place] MSW, [place]  07/07/2015, 3:58 PM

## 2015-07-07 NOTE — [facility] Group Notes (Signed)
[place] Group Notes:  (Nursing/MHT/Case Management/Adjunct)  Date:  07/07/2015  Time:  10:48 AM  Type of Therapy:  Psychoeducational Skills  Participation Level:  Active  Participation Quality:  Appropriate  Affect:  Appropriate  Cognitive:  Appropriate  Insight:  Appropriate  Engagement in Group:  Engaged  Modes of Intervention:  Education  Summary of Progress/Problems:  Rinaldo Cloud 07/07/2015, 10:48 AM

## 2015-07-07 NOTE — Plan of Care (Signed)
Problem: Diagnosis: Increased Risk For Suicide Attempt Goal: LTG-Patient Will Report Improved Mood and Deny Suicidal LTG (by discharge) Patient will report improved mood and deny suicidal ideation.  Outcome: Not Progressing Rates depression as a 7 and anxiety as a 9 and hopelessness as a 7.  Looks sad and has flat affect.

## 2015-07-07 NOTE — Progress Notes (Signed)
D: Pt denies SI/HI/AVH. Pt is pleasant and cooperative, affect flat and sad but brightens upon approach, she appears less anxious and she is interacting with peers and staff appropriately.  A: Pt was offered support and encouragement. Pt was given scheduled medications. Pt was encouraged to attend groups. Q 15 minute checks were done for safety.  R:Pt attends groups and interacts well with peers and staff. Pt is taking medication. Pt has no complaints.Pt receptive to treatment and safety maintained on unit.

## 2015-07-07 NOTE — Progress Notes (Signed)
Patient pleasant and cooperative, states her main concern today is feeling sick with a cold. Reports relief at being started on an antibiotic. Interactive in the milieu. No other needs or distress noted. Denies current SI, HI, and AVH. No withdrawal symptoms noted or reported.

## 2015-07-08 MED ORDER — AZITHROMYCIN 250 MG PO TABS: 250.0000 mg | ORAL_TABLET | Freq: Every day | ORAL | Status: DC

## 2015-07-08 MED ORDER — CHLORDIAZEPOXIDE HCL 25 MG PO CAPS
25.0000 mg | ORAL_CAPSULE | Freq: Three times a day (TID) | ORAL | Status: DC
  Administered 2015-07-08 – 2015-07-09 (×4): 25 mg via ORAL
  Filled 2015-07-08 (×4): qty 1

## 2015-07-08 MED ORDER — GABAPENTIN 300 MG PO CAPS: 900.0000 mg | ORAL_CAPSULE | Freq: Three times a day (TID) | ORAL | Status: DC

## 2015-07-08 MED ORDER — CHLORDIAZEPOXIDE HCL 25 MG PO CAPS: 25.0000 mg | ORAL_CAPSULE | Freq: Three times a day (TID) | ORAL | Status: DC

## 2015-07-08 MED ORDER — TRAZODONE HCL 150 MG PO TABS: 150.0000 mg | ORAL_TABLET | Freq: Every day | ORAL | Status: DC

## 2015-07-08 MED ORDER — VENLAFAXINE HCL ER 150 MG PO CP24: 300.0000 mg | ORAL_CAPSULE | Freq: Every day | ORAL | Status: DC

## 2015-07-08 MED ORDER — PROPRANOLOL HCL 10 MG PO TABS: 10.0000 mg | ORAL_TABLET | Freq: Three times a day (TID) | ORAL | Status: DC

## 2015-07-08 NOTE — Progress Notes (Signed)
[name] MD Progress Note  07/08/2015 10:15 AM [name]  MRN:  [id number] Subjective:  Patient reports doing better as far as withdrawals but still has tremors. She complained of not feeling good this morning due to nasal congestion and sore throat.  Started zpack yesterday.   Patient states that mood wise she is a little better but still depressed. She is no longer voicing suicidal thoughts. States that she is trying to participate in programming as much as she can, however she still very weak.  Reports decreased energy, decreased appetite, poor concentration, depressed mood. Denies side effects from medications. Denies other physical complaints.  Per nursing: Patient pleasant and cooperative, states her main concern today is feeling sick with a cold. Reports relief at being started on an antibiotic. Interactive in the milieu. No other needs or distress noted. Denies current SI, HI, and AVH. No withdrawal symptoms noted or reported.   Principal Problem: Severe recurrent major depression without psychotic features ([place]) Diagnosis:   Patient Active Problem List   Diagnosis Date Noted  . Severe recurrent major depression without psychotic features ([place]) [F33.2] 07/03/2015  . Alcohol use disorder, severe, dependence ([place]) [F10.20] 01/26/2015  . Tobacco use disorder [F17.200] 01/26/2015  . Alcohol withdrawal ([place]) [F10.239] 01/08/2015  . 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) [Z89.222] 01/08/2015  . GAD (generalized anxiety disorder) [F41.1] 12/24/2013  . PTSD (post-traumatic stress disorder) [F43.10] 12/22/2013   Total Time spent with patient: 30 minutes   Sleep: Good  Appetite:  [place]   Past Psychiatric History: This is her 5th admission to our unit since December 2015. Currently f/u with [facility]. Long history of alcohol abuse, depression, and anxiety. Has had some suicide attempts in the past but mostly also by substance abuse. She has had hospitalizations for detoxification  and hospitalizations for stabilization of mood and anxiety. She is currently taking venlafaxine extended release 300 mg a day which had been helpful for her anxiety and depression. She has taken other medicines in the past as well, which she found less helpful. Patient has been hospitalized before, she has been at [facility], that was her first hospitalization at the age of 78. She states that she was diagnosed with borderline personality disorder and was prescribed with lithium, BuSpar, and amitriptyline. She was also hospitalized in [place] where she was diagnosed with bipolar disorder. She has been in inpatient rehabilitation. The patient denies any history of suicidal attempts in the past and denies any history of self-injurious behaviors.   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 She is currently taking Neurontin 900 mg 3 times a day. She denies any history of seizures or head trauma. No other medical problems were reported. Past Medical History  Diagnosis Date  . Depression   . Anxiety   . Neuropathy ([place])   . Insomnia   . ETOH abuse     Past Surgical History  Procedure Laterality Date  . Cesarean section    . Joint replacement Right   . 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 Abdominal hysterectomy    . Right hip surgery     Family History:  Family History  Problem Relation Age of Onset  . COPD Mother   . Alcoholism Father   . COPD Father    Family Psychiatric History:The patient reports that sister, mother, aunt, and cousin have been diagnosed with borderline personality disorder and bipolar disorder. She said  that one of her sisters abuses alcohol and there is no family history of suicide.   Social History: Not working. Lives by herself in an apartment. Has adult children with whom she is an occasional contact. Pretty much estranged from all the rest of her family.  She says when she is sober she does have social contact with people she enjoys spending time with. She receives disability. She has two adult children who are in their 48's.She denies any history of legal problems. In terms of education she reports having an associate degree. History  Alcohol Use  . 14.4 oz/week  . 24 Cans of beer per week    Comment: 1 case per day    History  Drug Use No    Comment: Patient denies     Social History   Social History  . Marital Status: Divorced    Spouse Name: N/A  . Number of Children: N/A  . Years of Education: N/A   Social History Main Topics  . Smoking status: Current Every Day Smoker -- 0.50 packs/day    Types: Cigarettes  . Smokeless tobacco: Never Used  . Alcohol Use: 14.4 oz/week    24 Cans of beer per week     Comment: 1 case per day  . Drug Use: No     Comment: Patient denies   . Sexual Activity: Yes   Other Topics Concern  . None   Social History Narrative    Allergies:  Allergies  Allergen Reactions  . Benadryl [Diphenhydramine] Hives and Other (See Comments)    Reaction: Hyperactivity   . Cephalosporins Hives  . Codeine Nausea And Vomiting         Current Medications: Current Facility-Administered Medications  Medication Dose Route Frequency Provider Last Rate Last Dose  . acetaminophen (TYLENOL) tablet 650 mg  650 mg Oral Q6H PRN Gonzella Lex, MD   650 mg at 07/06/15 1110  . alum & mag hydroxide-simeth (MAALOX/MYLANTA) 200-200-20 MG/5ML suspension 30 mL  30 mL Oral Q4H PRN Gonzella Lex, MD      . azithromycin (ZITHROMAX) tablet 250 mg  250 mg Oral Daily Hildred Priest, MD   250 mg at 07/08/15 0830  . chlordiazePOXIDE (LIBRIUM) capsule 25 mg  25 mg Oral TID Hildred Priest, MD      . gabapentin (NEURONTIN) capsule 900 mg  900 mg Oral TID WC Hildred Priest, MD   900 mg at 07/08/15 0829  .  magnesium hydroxide (MILK OF MAGNESIA) suspension 30 mL  30 mL Oral Daily PRN Gonzella Lex, MD      . menthol-cetylpyridinium (CEPACOL) lozenge 3 mg  1 lozenge Oral PRN Gonzella Lex, MD   3 mg at 07/07/15 0841  . multivitamin with minerals tablet 1 tablet  1 tablet Oral Daily Gonzella Lex, MD   1 tablet at 07/08/15 [id number]  . nystatin (MYCOSTATIN/NYSTOP) topical powder   Topical BID Gonzella Lex, MD      . promethazine (PHENERGAN) tablet 25 mg  25 mg Oral Q8H PRN Hildred Priest, MD   25 mg at 07/05/15 1544  . propranolol (INDERAL) tablet 10 mg  10 mg Oral TID WC Hildred Priest, MD   10 mg at 07/08/15 0829  . traZODone (DESYREL) tablet 150 mg  150 mg Oral QHS Hildred Priest, MD   150 mg at 07/07/15 2207  . venlafaxine XR (EFFEXOR-XR) 24 hr capsule 300 mg  300 mg Oral Q breakfast Gonzella Lex, MD  300 mg at 07/08/15 [id number]    Lab Results:  No results found for this or any previous visit (from the past 48 hour(s)).  Physical Findings: AIMS: Facial and Oral Movements Muscles of Facial Expression: None, normal Lips and Perioral Area: None, normal Jaw: None, normal Tongue: None, normal,Extremity Movements Upper (arms, wrists, hands, fingers): None, normal Lower (legs, knees, ankles, toes): None, normal, Trunk Movements Neck, shoulders, hips: None, normal, Overall Severity Severity of abnormal movements (highest score from questions above): None, normal Incapacitation due to abnormal movements: None, normal Patient's awareness of abnormal movements (rate only patient's report): No Awareness, Dental Status Current problems with teeth and/or dentures?: No Does patient usually wear dentures?: No  CIWA:  CIWA-Ar Total: 7 COWS:  COWS Total Score: 3  Musculoskeletal: Strength & Muscle Tone: decreased Gait & Station: unsteady Patient leans: N/A  Psychiatric Specialty Exam: Review of Systems  HENT: Positive for congestion and sore throat.   Eyes:  Negative.   Cardiovascular: Negative.   Gastrointestinal: Negative.   Genitourinary: Negative.   Musculoskeletal: Negative.   Skin: Negative.   Neurological: Positive for weakness. Negative for tremors.  Endo/Heme/Allergies: Negative.   Psychiatric/Behavioral: Positive for depression and substance abuse. Negative for suicidal ideas, hallucinations and memory loss. The patient is not nervous/anxious and does not have insomnia.     Blood pressure 105/65, pulse 93, temperature 98.2 F (36.8 C), temperature source Oral, resp. rate 20, height 5\' 4"  (1.626 m), weight 61.236 kg (135 lb).Body mass index is 23.16 kg/(m^2).  General Appearance: Disheveled  Eye Contact::  Good  Speech:  Clear and Coherent  Volume:  Decreased  Mood:  Anxious and Dysphoric  Affect:  Appropriate  Thought Process:  Linear and Logical  Orientation:  Full (Time, Place, and Person)  Thought Content:  Hallucinations: None  Suicidal Thoughts:  No  Homicidal Thoughts:  No  Memory:  Immediate;   Fair Recent;   Good Remote;   Good  Judgement:  Poor  Insight:  Shallow  Psychomotor Activity:  Decreased  Concentration:  Fair  Recall:  [employer] of Knowledge:Good  Language: Good  Akathisia:  No  Handed:    AIMS (if indicated):     Assets:  Warehouse manager Resources/Insurance  ADL's:  Intact  Cognition: WNL  Sleep:  Number of Hours: 7.25   Treatment Plan Summary: Daily contact with patient to assess and evaluate symptoms and progress in treatment and Medication management   60 year old Caucasian female with history of depression, PTSD and alcoholism presented to our emergency department with alcohol withdrawal. The symptoms were so severe that the patient was transferred to the medical floor. There the patient was treated but continued to voice suicidal ideation and therefore she was transferred to our psychiatric facility. The patient has been in our unit a multitude of times in the past under very  similar circumstances.  MAJOR DEPRESSIVE DISORDER: Continue Effexor XR 300 mg by mouth daily  Insomnia: continue trazodone 150mg  by mouth daily at bedtime  Anxiety: continue Inderal 10 mg by mouth 3 times a day  Alcohol withdrawal: continue Librium but will decrease to 25 mg tid.  Nausea: continue Phenergan 25 mg every 8 hours as needed. No longer voicing nausea   Chronic pain neuropathic pain: continue Neurontin 900 mg by mouth 3 times a day  Unsteady gait weakness: patient evaluated by physical therapy while on the medical floor. They recommended transfer the patient to a nursing facility for physical therapy. Physical therapy will continue to  follow-up the patient was in the hospital.  Nasal congestion and sore throat/pharyngitis: Continue lozenges when necessary,  pseudoephedrine twice a day, and Zithromax   Precautions every 15 minute checks  Diet regular  Hospitalization the patient is currently under involuntary commitment  Discharge disposition: once psychiatrically is stable she will be transferred to nursing home facility to continue physical therapy---Plan to d/c to NH on Monday  Discharge follow-up: patient will continue to follow-up with [facility]  Hildred Priest 07/08/2015, 10:15 AM

## 2015-07-08 NOTE — Progress Notes (Signed)
Verbalizes that she just does not feel well today due to her sore throat.  Rates depression as a 8/10.  Denis SI/HI/AVH.  Continues to utilize wheelchair without any difficulty.  It was reported by another nurse that [name] was in dayroom telling other patients that her room mate was having a bad time and no one would listen to her.

## 2015-07-08 NOTE — Discharge Summary (Signed)
Physician Discharge Summary Note  Patient:  [name] is an 60 y.o., female MRN:  [id number] DOB:  11-03-1955 Patient phone:  [phone] (home)  Patient address:   [place] [address],  Total Time spent with patient: 30 minutes  Date of Admission:  07/03/2015 Date of Discharge: 07/09/15  Reason for Admission:  suicidality  Principal Problem: Severe recurrent major depression without psychotic features [facility]) Discharge Diagnoses: Patient Active Problem List   Diagnosis Date Noted  . Severe recurrent major depression without psychotic features ([place]) [F33.2] 07/03/2015  . Alcohol use disorder, severe, dependence ([place]) [F10.20] 01/26/2015  . Tobacco use disorder [F17.200] 01/26/2015  . Alcohol withdrawal ([place]) [F10.239] 01/08/2015  . 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 GAD (generalized anxiety disorder) [F41.1] 12/24/2013  . PTSD (post-traumatic stress disorder) [F43.10] 12/22/2013   History of Present Illness:  [name] is a 60 y.o. female that presented on 06/30/15 to our ER via EMS stating she had relapsed on alcohol over the Christmas Holidays due to problems with her current relationship. Patient reports ongoing daily use consuming 12 to 24 beers a day since Christmas. Alcohol level was 221 on presentation.  Patient is well-known to our service as she has been hospitalized in our behavioral health unit on multitude of times due to complications secondary to alcoholism. Patient also suffers from major depressive disorder and PTSD.  Patient states she discontinued all her psychiatric medications last week due to her alcohol use. Patient does admit to multiple attempts at trying to maintain her sobriety but has been unsuccessful although stated she had maintained her sobriety for "a little while" prior to this relapse. Patient stated she has active SI over a recently failed relationship and was trying to "drink herself to  death." Patient stated she "wants to be done with it all."   Patient was transferred to the medical floor from January 7 to January 10 due to alcohol withdrawals. The patient also had leukocytosis and hypernatremia. Once the patient was stabilized psychiatry was consulted. The patient continued to voice suicidal ideation and therefore she was transferred to psychiatry.  During her treatment on the medical floor she was seen by physical therapy as the patient appeared to have generalized weakness. They recommended inpatient rehabilitation at discharge. Per notes from social worker from the medical floor the patient was accepted to a nursing home skilled facility.  Per social worker assessment today here in the behavioral health unit patient has refused to go to the nursing facility. She is requesting to be discharged to a hotel room.  Today she tells me she does feel very depressed but no longer has thoughts about wanting to die. Patient is states recent relapse on alcohol was triggered after she spent 2 weeks with a boyfriend, they were both drinking heavily up to a case a day. After 2 weeks he left and this is when the patient contacted EMS. She still feels she is undergoing withdrawals. She complains of tremors, feeling cold all the time and having nausea. Patient says she will agree to go to a nursing home for physical therapy after discharge. She is currently living in a apartment but has been addicted and has to move out of her apartment before the third. She thinks her daughter can help her move out of her furniture and other belongings out of her apartment.  Associated Signs/Symptoms: Depression Symptoms: depressed mood, recurrent thoughts of death, decreased appetite, (Hypo) Manic Symptoms:  denies Anxiety Symptoms: Excessive Worry, Psychotic Symptoms: denies PTSD Symptoms: Had a traumatic exposure: see note above   Total Time spent with patient: 1 hour    Past Psychiatric  History: This is her 5th admission to our unit since December 2015. [facility] f[facility]u with [facility]. Long history of alcohol abuse, depression, and anxiety. Has had some suicide attempts in the past but mostly also by substance abuse. She has had hospitalizations for detoxification and hospitalizations for stabilization of mood and anxiety. She is currently taking venlafaxine extended release 300 mg a day which had been helpful for her anxiety and depression. She has taken other medicines in the past as well, which she found less helpful. Patient has been hospitalized before, she has been at [name], that was her first hospitalization at the age of 54. She states that she was diagnosed with borderline personality disorder and was prescribed with lithium, BuSpar, and amitriptyline. She was also hospitalized in [place] where she was diagnosed with bipolar disorder. She has been in inpatient rehabilitation. The patient denies any history of suicidal attempts in the past and denies any history of self-injurious behaviors.  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 She is currently taking Neurontin 900 mg 3 times a day. She denies any history of seizures or head trauma. No other medical problems were reported. Past Medical History  Diagnosis Date  . Depression   . Anxiety   . Neuropathy ([place])   . Insomnia   . ETOH abuse     Past Surgical History  Procedure Laterality Date  . Cesarean section    . Joint replacement Right   . 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 Abdominal hysterectomy    . Right hip surgery     Family History:  Family History  Problem Relation Age of Onset  . COPD Mother   . Alcoholism Father   . COPD Father    Family Psychiatric History:The patient reports that sister, mother, aunt, and cousin  have been diagnosed with borderline personality disorder and bipolar disorder. She said that one of her sisters abuses alcohol and there is no family history of suicide.   Social History: Not working. Lives by herself in an apartment. Has adult children with whom she is an occasional contact. Pretty much estranged from all the rest of her family. She says when she is sober she does have social contact with people she enjoys spending time with. She receives disability. She has two adult children who are in their 45's.She denies any history of legal problems. In terms of education she reports having an associate degree. History  Alcohol Use  . 14.4 oz/week  . 24 Cans of beer per week    Comment: 1 case per day    History  Drug Use No    Comment: Patient denies     Social History   Social History  . Marital Status: Divorced    Spouse Name: N/A  . Number of Children: N/A  . Years of Education: N/A   Social History Main Topics  . Smoking status: Current Every Day Smoker -- 0.50 packs/day    Types: Cigarettes  . Smokeless tobacco: Never Used  . Alcohol Use: 14.4 oz/week    24 Cans of beer per week     Comment: 1 case per day  .  Drug Use: No     Comment: Patient denies   . Sexual Activity: Yes   Other Topics Concern  . None   Social History Narrative    Allergies:  Allergies  Allergen Reactions  . Benadryl [Diphenhydramine] Hives and Other (See Comments)    Reaction: Hyperactivity   . Cephalosporins Hives  . Codeine Nausea And Vomiting         Hospital Course:   60 year old Caucasian female with history of depression, PTSD and alcoholism presented to our emergency department with alcohol withdrawal. The symptoms were so severe that the patient was transferred to the medical floor. There the patient was treated but continued to voice suicidal ideation and therefore she was  transferred to our psychiatric facility. The patient has been in our unit a multitude of times in the past under very similar circumstances.  MAJOR DEPRESSIVE DISORDER: Continue Effexor XR 300 mg by mouth daily  Insomnia: continue trazodone 1110m by mouth daily at bedtime  Anxiety: continue Inderal 10 mg by mouth 3 times a day  Alcohol withdrawal: continue Librium but will decrease to 25 mg tid. Patient will be discharged on additional 5 days of Librium 25 mg 3 times a day. The Librium can be discontinued after that.   Chronic pain neuropathic pain: continue Neurontin 900 mg by mouth 3 times a day  Unsteady gait weakness: patient evaluated by physical therapy while on the medical floor. They recommended transfer the patient to a nursing facility for physical therapy. Physical therapy will continue to follow-up the patient was in the hospital.  Nasal congestion and sore throat/pharyngitis: Continue Zithromax for 2 additional days  Diet regular  Discharge disposition: once psychiatrically is stable she will be transferred to nursing home facility to continue physical therapy---Plan to d/c to NH today  Discharge follow-up: patient will continue to follow-up with [facility]-community support team  During this hospitalization the patient did not require seclusion, restraints or forced medications. At all times she was pleasant, calm and cooperative. She did not display any unsafe behaviors. Alcohol withdrawal was uncomplicated.  On discharge she denied SI, HI, hopelessness, helplessness, denied auditory or visual hallucinations. Denied symptoms consistent with mania, hypomania. Denied side effects of medications. Denies physical complaints other than weakness.  Physical Findings: AIMS: Facial and Oral Movements Muscles of Facial Expression: None, normal Lips and Perioral Area: None, normal Jaw: None, normal Tongue: None, normal,Extremity Movements Upper (arms, wrists, hands, fingers): None,  normal Lower (legs, knees, ankles, toes): None, normal, Trunk Movements Neck, shoulders, hips: None, normal, Overall Severity Severity of abnormal movements (highest score from questions above): None, normal Incapacitation due to abnormal movements: None, normal Patient's awareness of abnormal movements (rate only patient's report): No Awareness, Dental Status Current problems with teeth and/or dentures?: No Does patient usually wear dentures?: No  CIWA:  CIWA-Ar Total: 7 COWS:  COWS Total Score: 3  Musculoskeletal: Strength & Muscle Tone: decreased Gait & Station: unsteady Patient leans: N/A  Psychiatric Specialty Exam: Review of Systems  HENT: Positive for congestion and sore throat.   Eyes: Negative.   Respiratory: Negative.   Cardiovascular: Negative.   Gastrointestinal: Negative.   Genitourinary: Negative.   Musculoskeletal: Negative.   Skin: Negative.   Neurological: Positive for weakness.  Endo/Heme/Allergies: Negative.   Psychiatric/Behavioral: Positive for substance abuse. Negative for depression and suicidal ideas.    Blood pressure 92/60, pulse 88, temperature 97.6 F (36.4 C), temperature source Oral, resp. rate 20, height _0  (1.626 m), weight 61.236 kg (135 lb).Body  mass index is 23.16 kg/(m^2).  General Appearance: Disheveled  Eye Contact::  Good  Speech:  Clear and Coherent  Volume:  Decreased  Mood:  Anxious  Affect:  Appropriate  Thought Process:  Logical  Orientation:  Full (Time, Place, and Person)  Thought Content:  Hallucinations: None  Suicidal Thoughts:  No  Homicidal Thoughts:  No  Memory:  Immediate;   Fair Recent;   Fair Remote;   Good  Judgement:  Fair  Insight:  Fair  Psychomotor Activity:  Decreased  Concentration:  Fair  Recall:  [place] of Knowledge:Good  Language: Good  Akathisia:  No  Handed:    AIMS (if indicated):     Assets:  Desire for Improvement Financial Resources/Insurance  ADL's:  Intact  Cognition: WNL   Sleep:  Number of Hours: 7.5     Metabolic Disorder Labs:  Lab Results  Component Value Date   HGBA1C 5.2 06/30/2015   No results found for: PROLACTIN Lab Results  Component Value Date   CHOL 195 07/05/2015   TRIG 143 07/05/2015   HDL 50 07/05/2015   CHOLHDL 3.9 07/05/2015   VLDL 29 07/05/2015   LDLCALC 116* 07/05/2015   Results for Reposa, Keryn D (MRN [id number]) as of 07/08/2015 14:21  Ref. Range 07/01/2015 04:55  Sodium Latest Ref Range: 135-145 mmol/L 142  Potassium Latest Ref Range: 3.5-5.1 mmol/L 3.8  Chloride Latest Ref Range: 101-111 mmol/L 114 (H)  CO2 Latest Ref Range: 22-32 mmol/L 24  BUN Latest Ref Range: 6-20 mg/dL 17  Creatinine Latest Ref Range: 0.44-1.00 mg/dL 0.62  Calcium Latest Ref Range: 8.9-10.3 mg/dL 8.0 (L)  EGFR (Non-African Amer.) Latest Ref Range: >60 mL/min >60  EGFR (African American) Latest Ref Range: >60 mL/min >60  Glucose Latest Ref Range: 65-99 mg/dL 83  Anion gap Latest Ref Range: 5-15  4 (L)    Results for Hollyann, Pablo Livie D (MRN [id number]) as of 07/08/2015 14:21  Ref. Range 07/01/2015 16:13 07/03/2015 05:45  WBC Latest Ref Range: 3.6-11.0 K/uL  5.8  RBC Latest Ref Range: 3.80-5.20 MIL/uL  3.73 (L)  Hemoglobin Latest Ref Range: 12.0-16.0 g/dL  11.8 (L)  HCT Latest Ref Range: 35.0-47.0 %  34.6 (L)  MCV Latest Ref Range: 80.0-100.0 fL  92.8  MCH Latest Ref Range: 26.0-34.0 pg  31.7  MCHC Latest Ref Range: 32.0-36.0 g/dL  34.2  RDW Latest Ref Range: 11.5-14.5 %  13.6  Platelets Latest Ref Range: 150-440 K/uL  125 (L)  Neutrophils Latest Units: %  49  Lymphocytes Latest Units: %  40  Monocytes Relative Latest Units: %  7  Eosinophil Latest Units: %  3  Basophil Latest Units: %  1  NEUT# Latest Ref Range: 1.4-6.5 K/uL  2.9  Lymphocyte # Latest Ref Range: 1.0-3.6 K/uL  2.3  Monocyte # Latest Ref Range: 0.2-0.9 K/uL  0.4  Eosinophils Absolute Latest Ref Range: 0-0.7 K/uL  0.2  Basophils Absolute Latest Ref Range: 0-0.1 K/uL  0.0  Appearance  Latest Ref Range: CLEAR  CLEAR (A)   Bacteria, UA Latest Ref Range: NONE SEEN  RARE (A)   Bilirubin Urine Latest Ref Range: NEGATIVE  NEGATIVE   Color, Urine Latest Ref Range: YELLOW  YELLOW (A)   Glucose Latest Ref Range: NEGATIVE mg/dL NEGATIVE   Hgb urine dipstick Latest Ref Range: NEGATIVE  2+ (A)   Ketones, ur Latest Ref Range: NEGATIVE mg/dL TRACE (A)   Leukocytes, UA Latest Ref Range: NEGATIVE  NEGATIVE   Mucous Unknown PRESENT  Nitrite Latest Ref Range: NEGATIVE  NEGATIVE   pH Latest Ref Range: 5.0-8.0  5.0   Protein Latest Ref Range: NEGATIVE mg/dL NEGATIVE   RBC / HPF Latest Ref Range: 0-5 RBC/hpf 0-5   Specific Gravity, Urine Latest Ref Range: 1.005-1.030  1.011   Squamous Epithelial / LPF Latest Ref Range: NONE SEEN  0-5 (A)   WBC, UA Latest Ref Range: 0-5 WBC/hpf 0-5   Amphetamines, Ur Screen Latest Ref Range: NONE DETECTED  NONE DETECTED   Barbiturates, Ur Screen Latest Ref Range: NONE DETECTED  NONE DETECTED   Benzodiazepine, Ur Scrn Latest Ref Range: NONE DETECTED  POSITIVE (A)   Cocaine Metabolite,Ur [place] Latest Ref Range: NONE DETECTED  NONE DETECTED   Methadone Scn, Ur Latest Ref Range: NONE DETECTED  NONE DETECTED   MDMA (Ecstasy)Ur Screen Latest Ref Range: NONE DETECTED  NONE DETECTED   Cannabinoid 50 Ng, Ur [place] Latest Ref Range: NONE DETECTED  NONE DETECTED   Opiate, Ur Screen Latest Ref Range: NONE DETECTED  NONE DETECTED   Phencyclidine (PCP) Ur S Latest Ref Range: NONE DETECTED  NONE DETECTED   Tricyclic, Ur Screen Latest Ref Range: NONE DETECTED  NONE DETECTED     Results for RENISE, GILLIES (MRN [id number]) as of 07/08/2015 14:21  Ref. Range 06/30/2015 12:45  TSH Latest Ref Range: 0.350-4.500 uIU/mL 0.580    Results for ANGELYS, YETMAN (MRN [id number]) as of 07/08/2015 14:21  Ref. Range 06/30/2015 12:45  Alkaline Phosphatase Latest Ref Range: 38-126 U/L 91  Albumin Latest Ref Range: 3.5-5.0 g/dL 4.5  AST Latest Ref Range: 15-41 U/L 33  ALT Latest Ref Range: 14-54  U/L 23  Total Protein Latest Ref Range: 6.5-8.1 g/dL 8.0  Total Bilirubin Latest Ref Range: 0.3-1.2 mg/dL 0.5     Medication List    STOP taking these medications        folic acid 1 MG tablet  Commonly known as:  FOLVITE     multivitamin with minerals Tabs tablet     thiamine 50 MG tablet  Commonly known as:  VITAMIN B-1      TAKE these medications      Indication   azithromycin 250 MG tablet  Commonly known as:  ZITHROMAX  Take 1 tablet (250 mg total) by mouth daily. 250 mg po q day  Start taking on:  07/10/2015  Notes to Patient:  Pharyngitis      chlordiazePOXIDE 25 MG capsule  Commonly known as:  LIBRIUM  Take 1 capsule (25 mg total) by mouth 3 (three) times daily.  Notes to Patient:  Alcohol withdrawal      gabapentin 300 MG capsule  Commonly known as:  NEURONTIN  Take 3 capsules (900 mg total) by mouth 3 (three) times daily with meals.  Notes to Patient:  Chronic pain      propranolol 10 MG tablet  Commonly known as:  INDERAL  Take 1 tablet (10 mg total) by mouth 3 (three) times daily with meals.  Notes to Patient:  Anxiety      traZODone 150 MG tablet  Commonly known as:  DESYREL  Take 1 tablet (150 mg total) by mouth at bedtime.  Notes to Patient:  Insomnia   Indication:  Trouble Sleeping     venlafaxine XR 150 MG 24 hr capsule  Commonly known as:  EFFEXOR-XR  Take 2 capsules (300 mg total) by mouth daily with breakfast.  Notes to Patient:  Depression   Indication:  Major Depressive Disorder  Follow-up Information    Follow up with [facility] .   Specialty:  [place] information:   [place] [phone]      Follow-up recommendations:  Other:  F/U with [facility]   Signed: Hildred Priest 07/09/2015, 8:02 AM

## 2015-07-08 NOTE — [facility] Suicide Risk Assessment (Signed)
[facility] Discharge Suicide Risk Assessment   Demographic Factors:  Divorced or widowed, Caucasian and Living alone  Total Time spent with patient: 30 minutes   Psychiatric Specialty Exam: Physical Exam  ROS  Have you used any form of tobacco in the last 30 days? (Cigarettes, Smokeless Tobacco, Cigars, and/or Pipes): Yes  Has this patient used any form of tobacco in the last 30 days? (Cigarettes, Smokeless Tobacco, Cigars, and/or Pipes) Yes, A prescription for an FDA-approved tobacco cessation medication was offered at discharge and the patient refused  Mental Status Per Nursing Assessment::   On Admission:  NA (Denies)  Current Mental Status by Physician: Hopeful, future oriented. Denies hopelessness, helplessness. Denies SI, HI. Mood is still dysphoric. Her affect is more reactive and brighter.  Loss Factors: Loss of significant relationship, Decline in physical health and Financial problems/change in socioeconomic status  Historical Factors: Impulsivity  Risk Reduction Factors:   Sense of responsibility to family  Continued Clinical Symptoms:  Depression:   Comorbid alcohol abuse/dependence Impulsivity Alcohol/Substance Abuse/Dependencies Chronic Pain Previous Psychiatric Diagnoses and Treatments Medical Diagnoses and Treatments/Surgeries  Cognitive Features That Contribute To Risk:  None    Suicide Risk:  Minimal: No identifiable suicidal ideation.  Patients presenting with no risk factors but with morbid ruminations; may be classified as minimal risk based on the severity of the depressive symptoms  Principal Problem: Severe recurrent major depression without psychotic features [facility]) Discharge Diagnoses:  Patient Active Problem List   Diagnosis Date Noted  . Severe recurrent major depression without psychotic features ([place]) [F33.2] 07/03/2015  . Alcohol use disorder, severe, dependence ([place]) [F10.20] 01/26/2015  . Tobacco use disorder [F17.200] 01/26/2015  . Alcohol  withdrawal ([place]) [F10.239] 01/08/2015  . 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 GAD (generalized anxiety disorder) [F41.1] 12/24/2013  . PTSD (post-traumatic stress disorder) [F43.10] 12/22/2013    Follow-up Information    Follow up with [name] .   Specialty:  [place] information:   [place] [phone]        Is patient on multiple antipsychotic therapies at discharge:  No   Has Patient had three or more failed trials of antipsychotic monotherapy by history:  No  Recommended Plan for Multiple Antipsychotic Therapies: NA    Hildred Priest 07/09/2015, 8:02 AM

## 2015-07-08 NOTE — [facility] Group Notes (Signed)
[facility] LCSW Group Therapy  07/08/2015 5:41 PM  Type of Therapy:  Group Therapy  Participation Level:  Active  Participation Quality:  Appropriate  Affect:  Appropriate  Cognitive:  Alert  Insight:  Improving  Engagement in Therapy:  Improving  Modes of Intervention:  Discussion, Education, Socialization and Support  Summary of Progress/Problems: Todays topic: Grudges  Patients will be encouraged to discuss their thoughts, feelings, and behaviors as to why one holds on to grudges and reasons why people have grudges. Patients will process the impact of grudges on their daily lives and identify thoughts and feelings related to holding grudges. Patients will identify feelings and thoughts related to what life would look like without grudges.   Arie Gable L Darleth Eustache MSW, LCSWA  07/08/2015, 5:41 PM   

## 2015-07-08 NOTE — Plan of Care (Signed)
Problem: Ineffective individual coping Goal: STG: Patient will remain free from self harm Outcome: Progressing Patient denies suicidal thoughts and agrees to let staff know if she has staff thoughts to harm herself.

## 2015-07-08 NOTE — Plan of Care (Signed)
Problem: Ineffective individual coping Goal: LTG: Patient will report a decrease in negative feelings Outcome: Not Progressing Presents with sad flat affect.  Verbalizes that she just does not feel well.  Rates depression as a 7/10

## 2015-07-09 NOTE — Progress Notes (Signed)
Recreation Therapy Notes  INPATIENT RECREATION TR PLAN  Patient Details Name: [name] MRN: [id number] DOB: 11-27-1955 Today's Date: 07/09/2015  Rec Therapy Plan Is patient appropriate for Therapeutic Recreation?: Yes Treatment times per week: At least once a week TR Treatment/Interventions: 1:1 session, Group participation (Comment) (Appropriate participation in daily recreation therapy tx)  Discharge Criteria Pt will be discharged from therapy if:: Treatment goals are met, Discharged Treatment plan/goals/alternatives discussed and agreed upon by:: Patient/family  Discharge Summary Short term goals set: See Care Plan Short term goals met: Complete Progress toward goals comments: One-to-one attended Which groups?: Communication, Self-esteem, Coping skills, Leisure education, Other (Comment) (Problem solving, teamwork) One-to-one attended: Self-esteem, stress management, coping skills Reason goals not met: N/A Therapeutic equipment acquired: None Reason patient discharged from therapy: Discharge from hospital Pt/family agrees with progress & goals achieved: Yes Date patient discharged from therapy: 07/09/15   Leonette Monarch, LRT/CTRS 07/09/2015, 5:01 PM

## 2015-07-09 NOTE — Progress Notes (Addendum)
  [facility] Adult Case Management Discharge Plan :  Will you be returning to the same living situation after discharge:  No, pt will be admitted into the [facility] skilled nursing facility in [place] At discharge, do you have transportation home?: Yes,  pt will be transported by [facility] Do you have the ability to pay for your medications: Yes,  pt will be provided with prescriptions at discharge  Release of information consent forms completed and in the chart;  Patient's signature needed at discharge.  Patient to Follow up at: Follow-up Information    Follow up with [facility] .   Why:  Patient is being transported by EMS and will be admitted on 07/09/15 for [place] rehabilitation services   Contact information:   [place] Ph: [phone] Fax: [phone]      Next level of care provider has access to [place] and Suicide Prevention discussed: No, pt refused  Have you used any form of tobacco in the last 30 days? (Cigarettes, Smokeless Tobacco, Cigars, and/or Pipes): Yes  Has patient been referred to the Quitline?: Patient refused referral  Patient has been referred for addiction treatment: Pt refused  Claudine Mouton 07/09/2015, 2:30 PM

## 2015-07-09 NOTE — Progress Notes (Signed)
Patient denies SI/HI, denies A/V hallucinations. Patient verbalizes understanding of discharge instructions, follow up care and prescriptions. Patient given all belongings from  [place].Called report to Patty in [place] health care. Patient escorted out by staff, transported by cab.

## 2015-07-09 NOTE — [facility] Suicide Risk Assessment (Signed)
[place] INPATIENT:  Family/Significant Other Suicide Prevention Education  Suicide Prevention Education:  Patient Refusal for Family/Significant Other Suicide Prevention Education: The patient [name] has refused to provide written consent for family/significant other to be provided Family/Significant Other Suicide Prevention Education during admission and/or prior to discharge.  Physician notified.  Pt refused SPE from [facility] 07/09/2015, 3:17 PM

## 2015-07-09 NOTE — [facility] Group Notes (Signed)
[facility] Group Notes:  (Nursing/MHT/Case Management/Adjunct)  Date:  07/09/2015  Time:  1:22 PM  Type of Therapy:  Group Therapy  Participation Level: Did not come.   Participation Quality:  Summary of Progress/Problems:  [name] 07/09/2015, 1:22 PM

## 2015-07-09 NOTE — Plan of Care (Signed)
Problem: [facility] Participation in Recreation Therapeutic Interventions Goal: STG-Patient will demonstrate improved self esteem by identif STG: Self-Esteem - Within 4 treatment sessions, patient will verbalize at least 5 positive affirmation statements in each of 2 treatment sessions to increase self-esteem post d/c.  Outcome: Completed/Met Date Met:  07/09/15 Treatment Session 2; Completed 2 out of 2: At approximately 11:31 am, LRT met with patient in patient room. Patient verbalized 5 positive affirmation statements. Patient reported it felt "good". LRT encouraged patient to continue saying positive affirmation statements. Intervention Used: I Am statements  Leonette Monarch, LRT/CTRS 01.16.17 2:26 pm Goal: STG-Patient will identify at least five coping skills for ** STG: Coping Skills - Within 4 treatment sessions, patient will verbalize at least 5 coping skills for substance abuse in each of 2 treatment sessions to decrease substance abuse post d/c.  Outcome: Completed/Met Date Met:  07/09/15 Treatment Session 2; Completed 2 out of 2: At approximately 11:31 am, LRT met with patient in patient room. Patient verbalized 5 coping skills for substance abuse. LRT encouraged patient to participate in leisure activities. Intervention Used: Coping Skills worksheet  Leonette Monarch, LRT/CTRS 01.16.17 2:27 pm Goal: STG-Other Recreation Therapy Goal (Specify) STG: Stress Management - Within 4 treatment sessions, patient will verbalize understanding of the stress management techniques in each of 2 treatment sessions to increase stress management skills post d/c.  Outcome: Completed/Met Date Met:  07/09/15 Treatment Session 2; Completed 2 out of 2: At approximately 11:31 am, LRT met with patient in patient room. Patient reported she read and practiced the stress management techniques. Patient verbalized understanding and reported the techniques were helpful. LRT encouraged patient to continue practicing  the stress management techniques. Intervention Used; Stress Management handouts  Leonette Monarch, LRT/CTRS 01.16.17 2:29 pm

## 2015-07-09 NOTE — Tx Team (Signed)
Interdisciplinary Treatment Plan Update (Adult)  Date:  07/09/2015 Time Reviewed:  12:26 PM  Progress in Treatment: Attending groups: Yes. Participating in groups:  Yes. Taking medication as prescribed:  Yes. Tolerating medication:  Yes. Family/Significant othe contact made:  No, will contact:  Pt refused Patient understands diagnosis:  Yes. Discussing patient identified problems/goals with staff:  Yes. Medical problems stabilized or resolved:  Yes. Denies suicidal/homicidal ideation: Yes. Issues/concerns per patient self-inventory:  Yes. Other:  New problem(s) identified: No, Describe:  NA  Discharge Plan or Barriers:  Pt will be transported to [facility] for rehabilitation at a skilled nursing facility  Reason for Continuation of Hospitalization: Depression Medication stabilization Suicidal ideation Withdrawal symptoms  Comments:[name] is a 60 y.o. female that presented on 06/30/15 to our ER via EMS stating she had relapsed on alcohol over the Christmas Holidays due to problems with her current relationship. Patient reports ongoing daily use consuming 12 to 24 beers a day since Christmas.Alcohol level was 221 on presentation.Patient is well-known to our service as she has been hospitalized in our behavioral health unit on multitude of times due to complications secondary to alcoholism. Patient also suffers from major depressive disorder and PTSD.Patient states she discontinued all her psychiatric medications last week due to her alcohol use. Patient does admit to multiple attempts at trying to maintain her sobriety but has been unsuccessful although stated she had maintained her sobriety for "a little while" prior to this relapse. Patient stated she has active SI over a recently failed relationship and was trying to "drink herself to death." Patient stated she "wants to be done with it all." Patient was transferred to the medical floor from January 7 to January 10 due to  alcohol withdrawals. The patient also had leukocytosis and hypernatremia. Once the patient was stabilized psychiatry was consulted. The patient continued to voice suicidal ideation and therefore she was transferred to psychiatry.During her treatment on the medical floor she was seen by physical therapy as the patient appeared to have generalized weakness. They recommended inpatient rehabilitation at discharge. Per notes from social worker from the medical floor the patient was accepted to a nursing home skilled facility. Per social worker assessment today here in the behavioral health unit patient has refused to go to the nursing facility. She is requesting to be discharged to a hotel room. Today she tells me she does feel very depressed but no longer has thoughts about wanting to die. Patient is states recent relapse on alcohol was triggered after she spent 2 weeks with a boyfriend, they were both drinking heavily up to a case a day. After 2 weeks he left and this is when the patient contacted EMS. She still feels she is undergoing withdrawals. She complains of tremors, feeling cold all the time and having nausea. Patient says she will agree to go to a nursing home for physical therapy after discharge. She is currently living in a apartment but has been evicted and has to move out of her apartment before the third. She thinks her daughter can help her move out of her furniture and other belongings out of her apartment  Estimated date of discharge: 07/09/15  New goal(s): na  Review of initial/current patient goals per problem list:   1.  Goal(s): Patient will participate in aftercare plan * Met: Yes * Target date: at discharge * As evidenced by: Patient will participate within aftercare plan AEB aftercare provider and housing plan at discharge being identified. * 1/16: Goal  completed.  Pt will be transported to [name] for rehabilitation at a skilled nursing facility   2.  Goal (s): Patient  will exhibit decreased depressive symptoms and suicidal ideations. * Met: Adequate for discharge per MD. *   Target date: at discharge * As evidenced by: Patient will utilize self rating of depression at 3 or below and demonstrate decreased signs of depression or be deemed stable for discharge by MD. Adequate for discharge per MD.  Pt denies SI   3.  Goal(s): Patient will demonstrate decreased signs and symptoms of anxiety. * Met: Adequate for discharge per MD. *  Target date: at discharge * As evidenced by: Patient will utilize self rating of anxiety at 3 or below and demonstrated decreased signs of anxiety, or be deemed stable for discharge by MD * 1/16: Adequate for discharge per MD. *   4.  Goal(s): Patient will demonstrate decreased signs of withdrawal due to substance abuse * Met: Adequate for discharge per MD. *  Target date: at discharge * As evidenced by: Patient will produce a CIWA/COWS score of 0, have stable vitals signs, and no symptoms of withdrawal. * 1/16: Adequate for discharge per MD. *   Attendees: Patient:  [name] 1/16/201712:26 PM  Family:   1/16/201712:26 PM  Physician:  Dr. Jerilee Hoh   1/16/201712:26 PM  Nursing: Tyler Pita, RN  1/16/201712:26 PM  Case Manager:   1/16/201712:26 PM  Counselor:   1/16/201712:26 PM  Other:  Alphonse Guild Laurali Goddard, LCSWA 1/16/201712:26 PM  Other:  Everitt Amber, Catarina  1/16/201712:26 PM  Other:   1/16/201712:26 PM  Other:  1/16/201712:26 PM  Other:  1/16/201712:26 PM  Other:  1/16/201712:26 PM  Other:  1/16/201712:26 PM  Other:  1/16/201712:26 PM  Other:  1/16/201712:26 PM  Other:   1/16/201712:26 PM   Scribe for Treatment Team:   Alphonse Guild Ernesta Trabert MSW, LCSWA , 07/09/2015, 12:26 PM

## 2015-09-05 ENCOUNTER — Encounter
Admission: RE | Admit: 2015-09-05 | Discharge: 2015-09-05 | Disposition: A | Discharge status: Home / Self Care | Payer: No Typology Code available for payment source | Source: Ambulatory Visit | Attending: Orthopedic Surgery | Admitting: Orthopedic Surgery

## 2015-09-05 ENCOUNTER — Ambulatory Visit
Admission: RE | Admit: 2015-09-05 | Discharge: 2015-09-05 | Disposition: A | Discharge status: Home / Self Care | Payer: No Typology Code available for payment source | Source: Ambulatory Visit | Attending: Anesthesiology | Admitting: Anesthesiology

## 2015-09-05 DIAGNOSIS — J45901 Unspecified asthma with (acute) exacerbation: Secondary | ICD-10-CM | POA: Insufficient documentation

## 2015-09-05 DIAGNOSIS — M1612 Unilateral primary osteoarthritis, left hip: Secondary | ICD-10-CM | POA: Insufficient documentation

## 2015-09-05 HISTORY — DX: Malignant (primary) neoplasm, unspecified: C80.1

## 2015-09-05 HISTORY — DX: Unspecified asthma, uncomplicated: J45.909

## 2015-09-05 HISTORY — DX: Unspecified osteoarthritis, unspecified site: M19.90

## 2015-09-05 LAB — BASIC METABOLIC PANEL
Anion gap: 8 (ref 5–15)
BUN: 15 mg/dL (ref 6–20)
CALCIUM: 9.1 mg/dL (ref 8.9–10.3)
CO2: 24 mmol/L (ref 22–32)
CREATININE: 0.76 mg/dL (ref 0.44–1.00)
Chloride: 105 mmol/L (ref 101–111)
GFR calc non Af Amer: >60 mL/min (ref >60)
Glucose, Bld: 88 mg/dL (ref 65–99)
Potassium: 3.7 mmol/L (ref 3.5–5.1)
Sodium: 137 mmol/L (ref 135–145)

## 2015-09-05 LAB — URINALYSIS COMPLETE WITH MICROSCOPIC (ARMC ONLY)
BILIRUBIN URINE: NEGATIVE
Bacteria, UA: NONE SEEN
GLUCOSE, UA: NEGATIVE mg/dL
KETONES UR: NEGATIVE mg/dL
Leukocytes, UA: NEGATIVE
NITRITE: NEGATIVE
Protein, ur: NEGATIVE mg/dL
SPECIFIC GRAVITY, URINE: 1.012 (ref 1.005–1.030)
pH: 5 (ref 5.0–8.0)

## 2015-09-05 LAB — CBC
HEMATOCRIT: 41.1 % (ref 35.0–47.0)
Hemoglobin: 13.9 g/dL (ref 12.0–16.0)
MCH: 29.8 pg (ref 26.0–34.0)
MCHC: 33.9 g/dL (ref 32.0–36.0)
MCV: 88 fL (ref 80.0–100.0)
Platelets: 179 10*3/uL (ref 150–440)
RBC: 4.67 MIL/uL (ref 3.80–5.20)
RDW: 13.6 % (ref 11.5–14.5)
WBC: 5.9 10*3/uL (ref 3.6–11.0)

## 2015-09-05 LAB — SURGICAL PCR SCREEN
MRSA, PCR: NEGATIVE
STAPHYLOCOCCUS AUREUS: NEGATIVE

## 2015-09-05 LAB — TYPE AND SCREEN
ABO/RH(D): A NEG
Antibody Screen: NEGATIVE

## 2015-09-05 LAB — ABO/RH: ABO/RH(D): A NEG

## 2015-09-05 LAB — APTT: aPTT: 33 seconds (ref 24–36)

## 2015-09-05 LAB — SEDIMENTATION RATE: Sed Rate: 5 mm/hr (ref 0–30)

## 2015-09-05 LAB — PROTIME-INR
INR: 0.92
Prothrombin Time: 12.6 seconds (ref 11.4–15.0)

## 2015-09-05 NOTE — Pre-Procedure Instructions (Signed)
Went to enter pre-op patent orders for ancef 2 grams IVPB and pharmacy alert came up that pt is allergic to Cephalosporins.  Called Dr. Clydell Hakim nurse Margaretha Sheffield, put Ancef on hold for now, she will speak with Dr. Marry Guan on what to do regarding pre-op antibiotics.

## 2015-09-05 NOTE — Pre-Procedure Instructions (Signed)
MEDICAL CLEARANCE REQUEST BY DR J ADAMS CALLED AND FAXED TO DR HOOTENS OFFICE. SPOKE WITH [name]

## 2015-09-05 NOTE — Patient Instructions (Signed)
  Your procedure is scheduled on: Monday September 17, 2015. Report to Same Day Surgery. To find out your arrival time please call [phone] between 1PM - 3PM on Friday September 14, 2015.  Remember: Instructions that are not followed completely may result in serious medical risk, up to and including death, or upon the discretion of your surgeon and anesthesiologist your surgery may need to be rescheduled.    _x___ 1. Do not eat food or drink liquids after midnight. No gum chewing or hard candies.     ____ 2. No Alcohol for 24 hours before or after surgery.   ____ 3. Bring all medications with you on the day of surgery if instructed.    __x__ 4. Notify your doctor if there is any change in your medical condition     (cold, fever, infections).     Do not wear jewelry, make-up, hairpins, clips or nail polish.  Do not wear lotions, powders, or perfumes. You may wear deodorant.  Do not shave 48 hours prior to surgery. Men may shave face and neck.  Do not bring valuables to the hospital.    [facility] is not responsible for any belongings or valuables.               Contacts, dentures or bridgework may not be worn into surgery.  Leave your suitcase in the car. After surgery it may be brought to your room.  For patients admitted to the hospital, discharge time is determined by your treatment team.   Patients discharged the day of surgery will not be allowed to drive home.    Please read over the following fact sheets that you were given:   [name] Preparing for Surgery  __x__ Take these medicines the morning of surgery with A SIP OF WATER:    1. gabapentin (NEURONTIN)  2. propranolol (INDERAL)  3. traZODone (DESYREL)  4. venlafaxine XR (EFFEXOR-XR)  5. Tylenol is optional  ____ Fleet Enema (as directed)   _x__ Use CHG Soap as directed  __x__ Use inhalers on the day of surgery and bring to hospital.  ____ Stop metformin 2 days prior to surgery    ____ Take 1/2 of usual  insulin dose the night before surgery and none on the morning of surgery.   ____ Stop Coumadin/Plavix/aspirin on does not apply.  ____ Stop Anti-inflammatories on does not apply.  OK to take Tylenol for pain.   ____ Stop supplements until after surgery.    ____ Bring C-Pap to the hospital.

## 2015-09-06 NOTE — Pre-Procedure Instructions (Signed)
CALLED AND FAXED CXR TO DR HOOTENS OFFICE TO ADD AS PART OF MEDICAL CLEARANCE REQUESTED 09/05/15. SPOKE WITH TIFFANY

## 2015-09-07 LAB — URINE CULTURE
Culture: NO GROWTH
Special Requests: NORMAL

## 2015-09-10 ENCOUNTER — Other Ambulatory Visit: Payer: Self-pay | Admitting: Family Medicine

## 2015-09-10 DIAGNOSIS — R911 Solitary pulmonary nodule: Secondary | ICD-10-CM

## 2015-09-11 NOTE — Pre-Procedure Instructions (Signed)
ELAINE REQUESTED INFO FOR CLEARANCE BE REFAXED AND SENT TO [facility]

## 2015-09-14 NOTE — Pre-Procedure Instructions (Signed)
Medical Clearance received from Dr. Lenor Coffin, patient at low risk for hip surgery.

## 2015-09-17 ENCOUNTER — Encounter: Payer: Self-pay | Admitting: Orthopedic Surgery

## 2015-09-17 ENCOUNTER — Encounter
Admission: RE | Disposition: A | Discharge status: Skilled Nursing Facility | Payer: Self-pay | Source: Ambulatory Visit | Attending: Orthopedic Surgery

## 2015-09-17 ENCOUNTER — Inpatient Hospital Stay
Admission: RE | Admit: 2015-09-17 | Discharge: 2015-09-20 | DRG: 470 | Disposition: A | Discharge status: Skilled Nursing Facility | Payer: Medicare Other | Source: Ambulatory Visit | Attending: Orthopedic Surgery | Admitting: Orthopedic Surgery

## 2015-09-17 ENCOUNTER — Inpatient Hospital Stay: Payer: Medicare Other | Admitting: Anesthesiology

## 2015-09-17 ENCOUNTER — Inpatient Hospital Stay: Payer: Medicare Other

## 2015-09-17 DIAGNOSIS — Z7951 Long term (current) use of inhaled steroids: Secondary | ICD-10-CM

## 2015-09-17 DIAGNOSIS — Z96643 Presence of artificial hip joint, bilateral: Secondary | ICD-10-CM

## 2015-09-17 DIAGNOSIS — M1612 Unilateral primary osteoarthritis, left hip: Secondary | ICD-10-CM | POA: Diagnosis present

## 2015-09-17 DIAGNOSIS — Z96641 Presence of right artificial hip joint: Secondary | ICD-10-CM | POA: Diagnosis present

## 2015-09-17 DIAGNOSIS — F17200 Nicotine dependence, unspecified, uncomplicated: Secondary | ICD-10-CM | POA: Diagnosis present

## 2015-09-17 DIAGNOSIS — F339 Major depressive disorder, recurrent, unspecified: Secondary | ICD-10-CM | POA: Diagnosis present

## 2015-09-17 DIAGNOSIS — Z885 Allergy status to narcotic agent status: Secondary | ICD-10-CM | POA: Diagnosis not present

## 2015-09-17 DIAGNOSIS — Z888 Allergy status to other drugs, medicaments and biological substances status: Secondary | ICD-10-CM | POA: Diagnosis not present

## 2015-09-17 DIAGNOSIS — Z89222 Acquired absence of left upper limb above elbow: Secondary | ICD-10-CM | POA: Diagnosis not present

## 2015-09-17 DIAGNOSIS — J449 Chronic obstructive pulmonary disease, unspecified: Secondary | ICD-10-CM | POA: Diagnosis present

## 2015-09-17 DIAGNOSIS — Z96649 Presence of unspecified artificial hip joint: Secondary | ICD-10-CM

## 2015-09-17 HISTORY — PX: TOTAL HIP ARTHROPLASTY: SHX124

## 2015-09-17 LAB — TYPE AND SCREEN
ABO/RH(D): A NEG
Antibody Screen: NEGATIVE

## 2015-09-17 LAB — ABO/RH: ABO/RH(D): A NEG

## 2015-09-17 SURGERY — ARTHROPLASTY, HIP, TOTAL,POSTERIOR APPROACH
Anesthesia: Spinal | Site: Hip | Laterality: Left | Wound class: Clean

## 2015-09-17 MED ORDER — BISACODYL 10 MG RE SUPP
10.0000 mg | Freq: Every day | RECTAL | Status: DC | PRN
  Administered 2015-09-19: 10 mg via RECTAL
  Filled 2015-09-17: qty 1

## 2015-09-17 MED ORDER — PROPOFOL 10 MG/ML IV BOLUS
INTRAVENOUS | Status: DC | PRN
  Administered 2015-09-17: 15 mg via INTRAVENOUS

## 2015-09-17 MED ORDER — BUPIVACAINE HCL (PF) 0.5 % IJ SOLN
INTRAMUSCULAR | Status: DC | PRN
  Administered 2015-09-17: 2 mL

## 2015-09-17 MED ORDER — VENLAFAXINE HCL ER 75 MG PO CP24
150.0000 mg | ORAL_CAPSULE | Freq: Every day | ORAL | Status: DC
  Administered 2015-09-18 – 2015-09-20 (×3): 150 mg via ORAL
  Filled 2015-09-17 (×3): qty 2

## 2015-09-17 MED ORDER — OXYCODONE HCL 5 MG PO TABS: 5.0000 mg | ORAL_TABLET | Freq: Once | ORAL | Status: DC | PRN

## 2015-09-17 MED ORDER — ALBUTEROL SULFATE (2.5 MG/3ML) 0.083% IN NEBU: 2.5000 mg | INHALATION_SOLUTION | Freq: Four times a day (QID) | RESPIRATORY_TRACT | Status: DC | PRN

## 2015-09-17 MED ORDER — OXYCODONE HCL 5 MG PO TABS
5.0000 mg | ORAL_TABLET | ORAL | Status: DC | PRN
  Administered 2015-09-17 (×3): 5 mg via ORAL
  Administered 2015-09-17 – 2015-09-19 (×8): 10 mg via ORAL
  Administered 2015-09-19 – 2015-09-20 (×3): 5 mg via ORAL
  Filled 2015-09-17: qty 2
  Filled 2015-09-17 (×2): qty 1
  Filled 2015-09-17 (×2): qty 2
  Filled 2015-09-17: qty 1
  Filled 2015-09-17 (×2): qty 2
  Filled 2015-09-17 (×2): qty 1
  Filled 2015-09-17 (×2): qty 2
  Filled 2015-09-17: qty 1
  Filled 2015-09-17: qty 2

## 2015-09-17 MED ORDER — SENNOSIDES-DOCUSATE SODIUM 8.6-50 MG PO TABS
1.0000 | ORAL_TABLET | Freq: Two times a day (BID) | ORAL | Status: DC
  Administered 2015-09-17 – 2015-09-20 (×6): 1 via ORAL
  Filled 2015-09-17 (×6): qty 1

## 2015-09-17 MED ORDER — FENTANYL CITRATE (PF) 100 MCG/2ML IJ SOLN
INTRAMUSCULAR | Status: DC | PRN
  Administered 2015-09-17: 50 ug via INTRAVENOUS

## 2015-09-17 MED ORDER — GUAIFENESIN 100 MG/5ML PO SOLN
20.0000 mL | Freq: Four times a day (QID) | ORAL | Status: DC | PRN
  Administered 2015-09-18: 400 mg via ORAL
  Filled 2015-09-17: qty 20

## 2015-09-17 MED ORDER — PROPRANOLOL HCL 20 MG PO TABS
10.0000 mg | ORAL_TABLET | Freq: Three times a day (TID) | ORAL | Status: DC
  Administered 2015-09-18 – 2015-09-19 (×4): 10 mg via ORAL
  Filled 2015-09-17 (×5): qty 1

## 2015-09-17 MED ORDER — MENTHOL 3 MG MT LOZG
1.0000 | LOZENGE | OROMUCOSAL | Status: DC | PRN
  Filled 2015-09-17: qty 9

## 2015-09-17 MED ORDER — SODIUM CHLORIDE 0.9 % IV SOLN
Freq: Once | INTRAVENOUS | Status: AC
  Administered 2015-09-17: 13:00:00 via INTRAVENOUS

## 2015-09-17 MED ORDER — FOLIC ACID 1 MG PO TABS
1.0000 mg | ORAL_TABLET | Freq: Every day | ORAL | Status: DC
  Administered 2015-09-17 – 2015-09-19 (×3): 1 mg via ORAL
  Filled 2015-09-17 (×3): qty 1

## 2015-09-17 MED ORDER — CHLORDIAZEPOXIDE HCL 25 MG PO CAPS: 25.0000 mg | ORAL_CAPSULE | Freq: Three times a day (TID) | ORAL | Status: DC

## 2015-09-17 MED ORDER — NICOTINE 21 MG/24HR TD PT24
21.0000 mg | MEDICATED_PATCH | Freq: Every day | TRANSDERMAL | Status: DC
  Administered 2015-09-17 – 2015-09-19 (×3): 21 mg via TRANSDERMAL
  Filled 2015-09-17 (×4): qty 1

## 2015-09-17 MED ORDER — SODIUM CHLORIDE 0.9 % IV SOLN
Freq: Once | INTRAVENOUS | Status: AC
  Administered 2015-09-17: 16:00:00 via INTRAVENOUS

## 2015-09-17 MED ORDER — SODIUM CHLORIDE 0.9 % IV SOLN
250.0000 mg | INTRAVENOUS | Status: DC | PRN
  Administered 2015-09-17: 4 ug/kg/min via INTRAVENOUS

## 2015-09-17 MED ORDER — ENOXAPARIN SODIUM 30 MG/0.3ML ~~LOC~~ SOLN
30.0000 mg | Freq: Two times a day (BID) | SUBCUTANEOUS | Status: DC
  Administered 2015-09-18 – 2015-09-20 (×5): 30 mg via SUBCUTANEOUS
  Filled 2015-09-17 (×5): qty 0.3

## 2015-09-17 MED ORDER — ACETAMINOPHEN 325 MG PO TABS
650.0000 mg | ORAL_TABLET | Freq: Four times a day (QID) | ORAL | Status: DC | PRN
  Administered 2015-09-18: 650 mg via ORAL
  Filled 2015-09-17: qty 2

## 2015-09-17 MED ORDER — ACETAMINOPHEN 650 MG RE SUPP: 650.0000 mg | Freq: Four times a day (QID) | RECTAL | Status: DC | PRN

## 2015-09-17 MED ORDER — ONDANSETRON HCL 4 MG PO TABS
4.0000 mg | ORAL_TABLET | Freq: Four times a day (QID) | ORAL | Status: DC | PRN
  Administered 2015-09-19: 4 mg via ORAL
  Filled 2015-09-17: qty 1

## 2015-09-17 MED ORDER — OXYCODONE HCL 5 MG/5ML PO SOLN: 5.0000 mg | Freq: Once | ORAL | Status: DC | PRN

## 2015-09-17 MED ORDER — CELECOXIB 200 MG PO CAPS
200.0000 mg | ORAL_CAPSULE | Freq: Two times a day (BID) | ORAL | Status: DC
  Administered 2015-09-17 – 2015-09-20 (×7): 200 mg via ORAL
  Filled 2015-09-17 (×7): qty 1

## 2015-09-17 MED ORDER — EPHEDRINE SULFATE 50 MG/ML IJ SOLN
INTRAMUSCULAR | Status: DC | PRN
  Administered 2015-09-17 (×2): 5 mg via INTRAVENOUS
  Administered 2015-09-17: 10 mg via INTRAVENOUS
  Administered 2015-09-17 (×2): 5 mg via INTRAVENOUS

## 2015-09-17 MED ORDER — FLEET ENEMA 7-19 GM/118ML RE ENEM: 1.0000 | ENEMA | Freq: Once | RECTAL | Status: DC | PRN

## 2015-09-17 MED ORDER — ALBUTEROL SULFATE HFA 108 (90 BASE) MCG/ACT IN AERS: 2.0000 | INHALATION_SPRAY | Freq: Four times a day (QID) | RESPIRATORY_TRACT | Status: DC | PRN

## 2015-09-17 MED ORDER — FENTANYL CITRATE (PF) 100 MCG/2ML IJ SOLN
INTRAMUSCULAR | Status: AC
  Administered 2015-09-17: 25 ug via INTRAVENOUS
  Filled 2015-09-17: qty 2

## 2015-09-17 MED ORDER — METOCLOPRAMIDE HCL 10 MG PO TABS
10.0000 mg | ORAL_TABLET | Freq: Three times a day (TID) | ORAL | Status: AC
  Administered 2015-09-17 – 2015-09-19 (×8): 10 mg via ORAL
  Filled 2015-09-17 (×8): qty 1

## 2015-09-17 MED ORDER — ACETAMINOPHEN 10 MG/ML IV SOLN
INTRAVENOUS | Status: AC
  Filled 2015-09-17: qty 100

## 2015-09-17 MED ORDER — TRAZODONE HCL 50 MG PO TABS
25.0000 mg | ORAL_TABLET | Freq: Three times a day (TID) | ORAL | Status: DC
  Administered 2015-09-17 – 2015-09-20 (×9): 25 mg via ORAL
  Filled 2015-09-17 (×9): qty 1

## 2015-09-17 MED ORDER — ACETAMINOPHEN 10 MG/ML IV SOLN
INTRAVENOUS | Status: DC | PRN
  Administered 2015-09-17: 1000 mg via INTRAVENOUS

## 2015-09-17 MED ORDER — TRANEXAMIC ACID 1000 MG/10ML IV SOLN
1000.0000 mg | Freq: Once | INTRAVENOUS | Status: AC
  Administered 2015-09-17: 1000 mg via INTRAVENOUS
  Filled 2015-09-17: qty 10

## 2015-09-17 MED ORDER — FERROUS SULFATE 325 (65 FE) MG PO TABS
325.0000 mg | ORAL_TABLET | Freq: Two times a day (BID) | ORAL | Status: DC
  Administered 2015-09-18 – 2015-09-20 (×5): 325 mg via ORAL
  Filled 2015-09-17 (×5): qty 1

## 2015-09-17 MED ORDER — CLINDAMYCIN PHOSPHATE 600 MG/50ML IV SOLN
600.0000 mg | Freq: Four times a day (QID) | INTRAVENOUS | Status: AC
  Administered 2015-09-17 – 2015-09-18 (×4): 600 mg via INTRAVENOUS
  Filled 2015-09-17 (×4): qty 50

## 2015-09-17 MED ORDER — NEOMYCIN-POLYMYXIN B GU 40-200000 IR SOLN
Status: AC
  Filled 2015-09-17: qty 1

## 2015-09-17 MED ORDER — PHENOL 1.4 % MT LIQD
1.0000 | OROMUCOSAL | Status: DC | PRN
  Filled 2015-09-17: qty 177

## 2015-09-17 MED ORDER — GABAPENTIN 300 MG PO CAPS
300.0000 mg | ORAL_CAPSULE | Freq: Three times a day (TID) | ORAL | Status: DC
  Administered 2015-09-17 – 2015-09-20 (×9): 300 mg via ORAL
  Filled 2015-09-17 (×9): qty 1

## 2015-09-17 MED ORDER — CLINDAMYCIN PHOSPHATE 600 MG/50ML IV SOLN
600.0000 mg | Freq: Once | INTRAVENOUS | Status: AC
  Administered 2015-09-17: 600 mg via INTRAVENOUS

## 2015-09-17 MED ORDER — TRANEXAMIC ACID 1000 MG/10ML IV SOLN
1000.0000 mg | INTRAVENOUS | Status: AC
  Administered 2015-09-17: 1000 mg via INTRAVENOUS
  Filled 2015-09-17: qty 10

## 2015-09-17 MED ORDER — KETAMINE HCL 50 MG/ML IJ SOLN
INTRAMUSCULAR | Status: DC | PRN
  Administered 2015-09-17: 1.5 mg via INTRAMUSCULAR

## 2015-09-17 MED ORDER — MAGNESIUM HYDROXIDE 400 MG/5ML PO SUSP
30.0000 mL | Freq: Every day | ORAL | Status: DC | PRN
  Administered 2015-09-18 – 2015-09-19 (×2): 30 mL via ORAL
  Filled 2015-09-17 (×2): qty 30

## 2015-09-17 MED ORDER — VITAMIN B-1 100 MG PO TABS
100.0000 mg | ORAL_TABLET | Freq: Every day | ORAL | Status: DC
  Administered 2015-09-17 – 2015-09-19 (×3): 100 mg via ORAL
  Filled 2015-09-17 (×3): qty 1

## 2015-09-17 MED ORDER — TRAZODONE HCL 100 MG PO TABS
300.0000 mg | ORAL_TABLET | Freq: Every day | ORAL | Status: DC
  Administered 2015-09-17 – 2015-09-19 (×3): 300 mg via ORAL
  Filled 2015-09-17 (×4): qty 3

## 2015-09-17 MED ORDER — TRAMADOL HCL 50 MG PO TABS
50.0000 mg | ORAL_TABLET | ORAL | Status: DC | PRN
  Administered 2015-09-17: 50 mg via ORAL
  Administered 2015-09-18 (×2): 100 mg via ORAL
  Administered 2015-09-19 (×2): 50 mg via ORAL
  Filled 2015-09-17 (×2): qty 2
  Filled 2015-09-17 (×3): qty 1

## 2015-09-17 MED ORDER — FLUTICASONE PROPIONATE 50 MCG/ACT NA SUSP
2.0000 | Freq: Every day | NASAL | Status: DC
  Administered 2015-09-17 – 2015-09-20 (×4): 2 via NASAL
  Filled 2015-09-17: qty 16

## 2015-09-17 MED ORDER — TETRACAINE HCL 1 % IJ SOLN
INTRAMUSCULAR | Status: DC | PRN
  Administered 2015-09-17: 10 mg via INTRASPINAL

## 2015-09-17 MED ORDER — FENTANYL CITRATE (PF) 100 MCG/2ML IJ SOLN
25.0000 ug | INTRAMUSCULAR | Status: DC | PRN
  Administered 2015-09-17 (×2): 25 ug via INTRAVENOUS

## 2015-09-17 MED ORDER — MORPHINE SULFATE (PF) 2 MG/ML IV SOLN
2.0000 mg | INTRAVENOUS | Status: DC | PRN
Start: 2015-09-17 — End: 2015-09-19
  Administered 2015-09-17 – 2015-09-18 (×2): 2 mg via INTRAVENOUS
  Filled 2015-09-17 (×2): qty 1

## 2015-09-17 MED ORDER — ALUM & MAG HYDROXIDE-SIMETH 200-200-20 MG/5ML PO SUSP: 30.0000 mL | ORAL | Status: DC | PRN

## 2015-09-17 MED ORDER — SODIUM CHLORIDE 0.9 % IV SOLN
INTRAVENOUS | Status: DC
  Administered 2015-09-17 (×2): via INTRAVENOUS

## 2015-09-17 MED ORDER — FAMOTIDINE 20 MG PO TABS
20.0000 mg | ORAL_TABLET | Freq: Once | ORAL | Status: AC
  Administered 2015-09-17: 20 mg via ORAL

## 2015-09-17 MED ORDER — PROPOFOL 500 MG/50ML IV EMUL
INTRAVENOUS | Status: DC | PRN
  Administered 2015-09-17: 40 ug/kg/min via INTRAVENOUS

## 2015-09-17 MED ORDER — ACETAMINOPHEN 10 MG/ML IV SOLN
1000.0000 mg | Freq: Four times a day (QID) | INTRAVENOUS | Status: AC
  Administered 2015-09-17 – 2015-09-18 (×3): 1000 mg via INTRAVENOUS
  Filled 2015-09-17 (×4): qty 100

## 2015-09-17 MED ORDER — MIDAZOLAM HCL 5 MG/5ML IJ SOLN
INTRAMUSCULAR | Status: DC | PRN
  Administered 2015-09-17: 2 mg via INTRAVENOUS

## 2015-09-17 MED ORDER — LACTATED RINGERS IV SOLN
INTRAVENOUS | Status: DC
  Administered 2015-09-17 (×3): via INTRAVENOUS

## 2015-09-17 MED ORDER — ONDANSETRON HCL 4 MG/2ML IJ SOLN: 4.0000 mg | Freq: Four times a day (QID) | INTRAMUSCULAR | Status: DC | PRN

## 2015-09-17 SURGICAL SUPPLY — 50 items
BLADE DRUM FLTD (BLADE) ×3 IMPLANT
BLADE SAW 1 (BLADE) ×3 IMPLANT
CANISTER SUCT 1200ML W/VALVE (MISCELLANEOUS) ×3 IMPLANT
CANISTER SUCT 3000ML (MISCELLANEOUS) ×6 IMPLANT
CAPT HIP TOTAL 2 ×3 IMPLANT
CARTRIDGE OIL MAESTRO DRILL (MISCELLANEOUS) ×1 IMPLANT
CATH FOL LEG HOLDER (MISCELLANEOUS) ×3 IMPLANT
CATH TRAY METER 16FR LF (MISCELLANEOUS) ×3 IMPLANT
DIFFUSER MAESTRO (MISCELLANEOUS) ×3 IMPLANT
DRAPE INCISE IOBAN 66X60 STRL (DRAPES) ×3 IMPLANT
DRAPE SHEET LG 3/4 BI-LAMINATE (DRAPES) ×3 IMPLANT
DRESSING ALLEVYN LIFE SACRUM (GAUZE/BANDAGES/DRESSINGS) ×3 IMPLANT
DRSG DERMACEA 8X12 NADH (GAUZE/BANDAGES/DRESSINGS) ×3 IMPLANT
DRSG OPSITE POSTOP 4X10 (GAUZE/BANDAGES/DRESSINGS) ×3 IMPLANT
DRSG OPSITE POSTOP 4X12 (GAUZE/BANDAGES/DRESSINGS) ×3 IMPLANT
DRSG OPSITE POSTOP 4X14 (GAUZE/BANDAGES/DRESSINGS) ×3 IMPLANT
DRSG TEGADERM 4X4.75 (GAUZE/BANDAGES/DRESSINGS) ×3 IMPLANT
DURAPREP 26ML APPLICATOR (WOUND CARE) ×3 IMPLANT
ELECT BLADE 6.5 EXT (BLADE) ×3 IMPLANT
ELECT CAUTERY BLADE 6.4 (BLADE) ×3 IMPLANT
GLOVE BIOGEL M STRL SZ7.5 (GLOVE) ×6 IMPLANT
GLOVE INDICATOR 8.0 STRL GRN (GLOVE) ×3 IMPLANT
GLOVE SURG 9.0 ORTHO LTXF (GLOVE) ×3 IMPLANT
GLOVE SURG ORTHO 9.0 STRL STRW (GLOVE) ×3 IMPLANT
GOWN STRL REUS W/ TWL LRG LVL3 (GOWN DISPOSABLE) ×2 IMPLANT
GOWN STRL REUS W/TWL 2XL LVL3 (GOWN DISPOSABLE) ×3 IMPLANT
GOWN STRL REUS W/TWL LRG LVL3 (GOWN DISPOSABLE) ×4
HANDPIECE SUCTION TUBG SURGILV (MISCELLANEOUS) ×3 IMPLANT
HEMOVAC 400CC 10FR (MISCELLANEOUS) ×3 IMPLANT
HOOD PEEL AWAY FLYTE STAYCOOL (MISCELLANEOUS) ×6 IMPLANT
KIT RM TURNOVER STRD PROC AR (KITS) ×3 IMPLANT
NDL SAFETY 18GX1.5 (NEEDLE) ×3 IMPLANT
NS IRRIG 500ML POUR BTL (IV SOLUTION) ×3 IMPLANT
OIL CARTRIDGE MAESTRO DRILL (MISCELLANEOUS) ×3
PACK HIP PROSTHESIS (MISCELLANEOUS) ×3 IMPLANT
SOL .9 NS 3000ML IRR  AL (IV SOLUTION) ×2
SOL .9 NS 3000ML IRR UROMATIC (IV SOLUTION) ×1 IMPLANT
SOL PREP PVP 2OZ (MISCELLANEOUS) ×3
SOLUTION PREP PVP 2OZ (MISCELLANEOUS) ×1 IMPLANT
SPONGE DRAIN TRACH 4X4 STRL 2S (GAUZE/BANDAGES/DRESSINGS) ×3 IMPLANT
STAPLER SKIN PROX 35W (STAPLE) ×3 IMPLANT
SUT ETHIBOND #5 BRAIDED 30INL (SUTURE) ×3 IMPLANT
SUT VIC AB 0 CT1 36 (SUTURE) ×3 IMPLANT
SUT VIC AB 1 CT1 36 (SUTURE) ×6 IMPLANT
SUT VIC AB 2-0 CT1 27 (SUTURE) ×2
SUT VIC AB 2-0 CT1 TAPERPNT 27 (SUTURE) ×1 IMPLANT
SYR 20CC LL (SYRINGE) ×3 IMPLANT
TAPE ADH 3 LX (MISCELLANEOUS) ×3 IMPLANT
TAPE TRANSPORE STRL 2 31045 (GAUZE/BANDAGES/DRESSINGS) ×3 IMPLANT
TOWEL OR 17X26 4PK STRL BLUE (TOWEL DISPOSABLE) ×3 IMPLANT

## 2015-09-17 NOTE — OR Nursing (Signed)
Sacral pad sent to OR 

## 2015-09-17 NOTE — Brief Op Note (Signed)
09/17/2015  10:04 AM  PATIENT:  [name]  60 y.o. female  PRE-OPERATIVE DIAGNOSIS:  PRIMARY OSTEOARTHRITIS of the left hip  POST-OPERATIVE DIAGNOSIS:  Same  PROCEDURE:  Procedure(s): TOTAL HIP ARTHROPLASTY (Left)  SURGEON:  Surgeon(s) and Role:    * Dereck Leep, MD - Primary  ASSISTANTS: Vance Peper, PA   ANESTHESIA:   spinal  EBL:  200 ml  BLOOD ADMINISTERED:none  DRAINS: 2 medium Hemovac drains   LOCAL MEDICATIONS USED:  NONE  SPECIMEN:  Source of Specimen:  Left femoral head  DISPOSITION OF SPECIMEN:  PATHOLOGY  COUNTS:  YES  TOURNIQUET:  * No tourniquets in log *  DICTATION: .Dragon Dictation  PLAN OF CARE: Admit to inpatient   PATIENT DISPOSITION:  PACU - hemodynamically stable.   Delay start of Pharmacological VTE agent (>24hrs) due to surgical blood loss or risk of bleeding: yes

## 2015-09-17 NOTE — Anesthesia Procedure Notes (Signed)
Spinal Patient location during procedure: OR Start time: 09/17/2015 7:22 AM End time: 09/17/2015 7:24 AM Staffing Anesthesiologist: Katy Fitch K Performed by: anesthesiologist  Preanesthetic Checklist Completed: patient identified, site marked, surgical consent, pre-op evaluation, timeout performed, IV checked, risks and benefits discussed and monitors and equipment checked Spinal Block Patient position: sitting Prep: Betadine Patient monitoring: heart rate, continuous pulse ox, blood pressure and cardiac monitor Approach: midline Location: L4-5 Injection technique: single-shot Needle Needle type: Whitacre and Introducer  Needle gauge: 24 G Needle length: 9 cm Assessment Sensory level: T10 Additional Notes Negative paresthesia. Negative blood return. Positive free-flowing CSF. Expiration date of kit checked and confirmed. Patient tolerated procedure well, without complications.

## 2015-09-17 NOTE — Progress Notes (Signed)
Pt said she no longer takes librium. Dr Marry Guan said to discontinue this

## 2015-09-17 NOTE — Op Note (Signed)
OPERATIVE NOTE  DATE OF SURGERY:  09/17/2015  PATIENT NAME:  [name]   DOB: 1956-04-22  MRN: [id number]  PRE-OPERATIVE DIAGNOSIS: Degenerative arthrosis of the left hip, primary  POST-OPERATIVE DIAGNOSIS:  Same  PROCEDURE:  Left total hip arthroplasty  SURGEON:  Marciano Sequin. M.D.  ASSISTANT:  Vance Peper, PA (present and scrubbed throughout the case, critical for assistance with exposure, retraction, instrumentation, and closure)  ANESTHESIA: spinal  ESTIMATED BLOOD LOSS: 200 mL  FLUIDS REPLACED: 1800 mL of crystalloid  DRAINS: 2 medium drains to a Hemovac reservoir  IMPLANTS UTILIZED: DePuy 12 mm small stature AML femoral stem, 50 mm OD Pinnacle 100 acetabular component, +4 mm neutral Pinnacle Marathon polyethylene insert, and a 32 mm CoCr +1 mm hip ball  INDICATIONS FOR SURGERY: [name] is a 60 y.o. year old female with a long history of progressive hip and groin  pain. X-rays demonstrated severe degenerative changes. The patient had not seen any significant improvement despite conservative nonsurgical intervention. After discussion of the risks and benefits of surgical intervention, the patient expressed understanding of the risks benefits and agree with plans for total hip arthroplasty.   The risks, benefits, and alternatives were discussed at length including but not limited to the risks of infection, bleeding, nerve injury, stiffness, blood clots, the need for revision surgery, limb length inequality, dislocation, cardiopulmonary complications, among others, and they were willing to proceed.  PROCEDURE IN DETAIL: The patient was brought into the operating room and, after adequate spinal anesthesia was achieved, the patient was placed in a right lateral decubitus position. Axillary roll was placed and all bony prominences were well-padded. The patient's left hip was cleaned and prepped with alcohol and DuraPrep and draped in the usual sterile fashion. A "timeout"  was performed as per usual protocol. A lateral curvilinear incision was made gently curving towards the posterior superior iliac spine. The IT band was incised in line with the skin incision and the fibers of the gluteus maximus were split in line. The piriformis tendon was identified, skeletonized, and incised at its insertion to the proximal femur and reflected posteriorly. A T type posterior capsulotomy was performed. Prior to dislocation of the femoral head, a threaded Steinmann pin was inserted through a separate stab incision into the pelvis superior to the acetabulum and bent in the form of a stylus so as to assess limb length and hip offset throughout the procedure. The femoral head was then dislocated posteriorly. Inspection of the femoral head demonstrated severe degenerative changes with full-thickness loss of articular cartilage. The femoral neck cut was performed using an oscillating saw. The anterior capsule was elevated off of the femoral neck using a periosteal elevator. Attention was then directed to the acetabulum. The remnant of the labrum was excised using electrocautery. Inspection of the acetabulum also demonstrated significant degenerative changes. The acetabulum was reamed in sequential fashion up to a 49 mm diameter. Good punctate bleeding bone was encountered. A 50 mm Pinnacle 100 acetabular component was positioned and impacted into place. Good scratch fit was appreciated. A +4 mm polyethylene trial was inserted.  Attention was then directed to the proximal femur. A hole for reaming of the proximal femoral canal was created using a high-speed burr. The femoral canal was reamed in sequential fashion up to a 11.5 mm diameter. This allowed for approximately 5 cm of scratch fit. Serial broaches were inserted up to a 12 mm small stature femoral broach. Calcar region was planed and a trial  reduction was performed using a 32 mm hip ball with a +1 mm neck length. Good equalization of limb  lengths and hip offset was appreciated and excellent stability was noted both anteriorly and posteriorly. Trial components were removed. The acetabular shell was irrigated with copious amounts of normal saline with antibiotic solution and suctioned dry. A +4 mm Pinnacle Marathon polyethylene insert was positioned and impacted into place. Next, a 12 mm small stature AML femoral stem was positioned and impacted into place. Excellent scratch fit was appreciated. A trial reduction was again performed with a 32 mm hip ball with a +1 mm neck length. Again, good equalization of limb lengths was appreciated and excellent stability appreciated both anteriorly and posteriorly. The hip was then dislocated and the trial hip ball was removed. The Morse taper was cleaned and dried. A 32 mm cobalt chrome hip ball with a +1 mm neck length was placed on the trunnion and impacted into place. The hip was then reduced and placed through range of motion. Excellent stability was appreciated both anteriorly and posteriorly.  The wound was irrigated with copious amounts of normal saline with antibiotic solution and suctioned dry. Good hemostasis was appreciated. The posterior capsulotomy was repaired using #5 Ethibond. Piriformis tendon was reapproximated to the undersurface of the gluteus medius tendon using #5 Ethibond. Two medium drains were placed in the wound bed and brought out through separate stab incisions to be attached to a Hemovac reservoir. The IT band was reapproximated using interrupted sutures of #1 Vicryl. Subcutaneous tissue was approximated using first #0 Vicryl followed by #2-0 Vicryl. The skin was closed with skin staples.  The patient tolerated the procedure well and was transported to the recovery room in stable condition.   Marciano Sequin., M.D.

## 2015-09-17 NOTE — H&P (Signed)
The patient has been re-examined, and the chart reviewed, and there have been no interval changes to the documented history and physical.    The risks, benefits, and alternatives have been discussed at length. The patient expressed understanding of the risks benefits and agreed with plans for surgical intervention.  James P. Hooten, Jr. M.D.    

## 2015-09-17 NOTE — Evaluation (Signed)
Physical Therapy Evaluation Patient Details Name: [name] MRN: [id number] DOB: 03-22-1956 Today's Date: 09/17/2015   History of Present Illness  Patient is a 60 y/o female that presents for L THR on 09/17/2015. She hasa PMH including: a L above elbow amputation, previous R hip sx, EtOH abuse, neuropathy, anxiety, and depression.   Clinical Impression  Patient reports she has largely been wheelchair bound at a SNF secondary to pain for some time now, she was beginning to ambulate with SPC, as she has a previous LUE amputation above the elbow. This significantly impacts her bed mobility, transfers, and gait as her RUE is unable to provide enough support currently to allow her to independently transfer. She demonstrates good tolerance for exercises, though L quad is noted to be weak/painful in this session. She responds well to cuing for increased LLE weightbearing, but is unable to sit in chair due to height, will likely need a pillow underneath her so that she does not have to descend so far to chair. She demonstrates significant limitations in bed mobility, transfers, and ambulation and would benefit from short term rehabilitation once she is appropriate for discharge to return to more independent mobility.     Follow Up Recommendations SNF    Equipment Recommendations       Recommendations for Other Services       Precautions / Restrictions Precautions Precautions: Posterior Hip;Fall Precaution Booklet Issued: Yes (comment) Restrictions Weight Bearing Restrictions: Yes LLE Weight Bearing: Weight bearing as tolerated      Mobility  Bed Mobility Overal bed mobility: Needs Assistance Bed Mobility: Supine to Sit;Sit to Supine     Supine to sit: Min assist;Mod assist Sit to supine: Min assist;Mod assist   General bed mobility comments: Patient required assistance managing LLE and torso in transferring supine to sit, in sit to supine patient instructed to come down on RLE, with  PT managing LEs for pain control.   Transfers Overall transfer level: Needs assistance Equipment used: Straight cane Transfers: Sit to/from Stand Sit to Stand: Mod assist         General transfer comment: Patient initially quite hesitant with transfer as she was experiencing pain with initial attempt, with PT assistance she was able to tolerate, though swaying noted secondary to pain.   Ambulation/Gait Ambulation/Gait assistance: Min assist Ambulation Distance (Feet): 5 Feet Assistive device: Straight cane Gait Pattern/deviations: Step-to pattern;Decreased step length - right;Decreased step length - left;Narrow base of support;Antalgic   Gait velocity interpretation: Below normal speed for age/gender General Gait Details: Patient initially struggles to bear weight on LLE, coming down hard on RLE with attempts at ambulation. With cuing she is able to accept weight better, though decreased step lengths and lateral balance deficits noted with step to pattern to transfer for chair.   Stairs            Wheelchair Mobility    Modified Rankin (Stroke Patients Only)       Balance Overall balance assessment: Needs assistance Sitting-balance support: Feet supported Sitting balance-Leahy Scale: Normal     Standing balance support: Single extremity supported Standing balance-Leahy Scale: Fair Standing balance comment: Patient noted initially to bear weight poorly through LLE, however with cuing for softer landings, decreased severity of landing, improving balance.                              Pertinent Vitals/Pain Pain Assessment: Faces Faces Pain Scale: Hurts even more  Pain Location: L hip Pain Descriptors / Indicators: Operative site guarding Pain Intervention(s): Limited activity within patient's tolerance;Repositioned;Patient requesting pain meds-RN notified;Premedicated before session;Monitored during session    Home Living Family/patient expects to be  discharged to:: Skilled nursing facility Living Arrangements: Alone                    Prior Function Level of Independence: Needs assistance         Comments: Patient has been largely wheelchair bound due to pain since January, reports she was beginning to ambulate with SPC at rehab prior to this admission.      Hand Dominance   Dominant Hand: Right    Extremity/Trunk Assessment   Upper Extremity Assessment: LUE deficits/detail       LUE Deficits / Details: Above elbow amputation   Lower Extremity Assessment: LLE deficits/detail   LLE Deficits / Details: Pain limiting observed strength, able to complete SLRs with very minimal assistance, LAQs also with minimal to no assistance.      Communication   Communication: No difficulties  Cognition Arousal/Alertness: Awake/alert Behavior During Therapy: WFL for tasks assessed/performed Overall Cognitive Status: Within Functional Limits for tasks assessed                      General Comments General comments (skin integrity, edema, etc.): Drains in place to conclude session.    Exercises Total Joint Exercises Ankle Circles/Pumps: AROM;Both;15 reps Gluteal Sets: AROM;Both;10 reps Heel Slides: AROM;10 reps;Both Hip ABduction/ADduction: AROM;10 reps;Both Straight Leg Raises: AROM;Right;AAROM;Left;10 reps Long Arc Quad: AROM;10 reps;Both      Assessment/Plan    PT Assessment Patient needs continued PT services  PT Diagnosis Difficulty walking;Generalized weakness;Abnormality of gait   PT Problem List Decreased strength;Decreased mobility;Decreased knowledge of precautions;Decreased activity tolerance;Decreased balance  PT Treatment Interventions DME instruction;Therapeutic activities;Therapeutic exercise;Gait training;Stair training;Balance training   PT Goals (Current goals can be found in the Care Plan section) Acute Rehab PT Goals Patient Stated Goal: To increase her tolerance for ambulation. PT Goal  Formulation: With patient Time For Goal Achievement: 10/01/15 Potential to Achieve Goals: Good    Frequency BID   Barriers to discharge Decreased caregiver support Patient has been largely w/c bound at SNF since January    Co-evaluation               End of Session Equipment Utilized During Treatment: Gait belt Activity Tolerance: Patient tolerated treatment well;Patient limited by pain Patient left: in bed;with bed alarm set;with call bell/phone within reach Nurse Communication: Mobility status (Asked RN to confirm appropriate bed positioning.)         Time: 1731-1759 PT Time Calculation (min) (ACUTE ONLY): 28 min   Charges:   PT Evaluation $PT Eval Moderate Complexity: 1 Procedure PT Treatments $Therapeutic Exercise: 8-22 mins   PT G Codes:       Kerman Passey, PT, DPT    09/17/2015, 6:17 PM

## 2015-09-17 NOTE — Progress Notes (Signed)
BP low.  Pt is a smoker.  Nicotine patch and 1063ml bolus ordered

## 2015-09-17 NOTE — Anesthesia Preprocedure Evaluation (Signed)
Anesthesia Evaluation  Patient identified by MRN, date of birth, ID band Patient awake    Reviewed: Allergy & Precautions, H&P , NPO status , Patient's Chart, lab work & pertinent test results  History of Anesthesia Complications Negative for: history of anesthetic complications  Airway Mallampati: III  TM Distance: >3 FB Neck ROM: limited    Dental no notable dental hx. (+) Poor Dentition, Chipped   Pulmonary neg shortness of breath, asthma , COPD, Current Smoker,    Pulmonary exam normal breath sounds clear to auscultation       Cardiovascular Exercise Tolerance: Good (-) angina(-) Past MI and (-) DOE negative cardio ROS Normal cardiovascular exam Rhythm:regular Rate:Normal     Neuro/Psych PSYCHIATRIC DISORDERS Anxiety Depression negative neurological ROS     GI/Hepatic negative GI ROS, Neg liver ROS, neg GERD  ,  Endo/Other  negative endocrine ROS  Renal/GU negative Renal ROS  negative genitourinary   Musculoskeletal  (+) Arthritis ,   Abdominal   Peds  Hematology negative hematology ROS (+)   Anesthesia Other Findings Past Medical History:   Anxiety                                                      Neuropathy (HCC)                                             Insomnia                                                     ETOH abuse                                                   Depression                                                   Anxiety                                                        Comment:panic anxiety disorder   Asthma                                                         Comment:chronic asthmatic bronchitis   Arthritis  Cancer [facility])                                                   Comment:Desmoid tumor left forearm  Past Surgical History:   CESAREAN SECTION                                              ARM AMPUTATION AT  SHOULDER                      Left              ABDOMINAL HYSTERECTOMY                                        CESAREAN SECTION                                 1985 & 19*     Comment:x 2   ABDOMINAL HYSTERECTOMY                           2007         ARM AMPUTATION                                  Left 1998           Comment: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   JOINT REPLACEMENT                               Right 2012           Comment:hip  BMI    Body Mass Index   26.24 kg/m 2      Reproductive/Obstetrics negative OB ROS                             Anesthesia Physical Anesthesia Plan  ASA: III  Anesthesia Plan: Spinal   Post-op Pain Management:    Induction:   Airway Management Planned:   Additional Equipment:   Intra-op Plan:   Post-operative Plan:   Informed Consent: I have reviewed the patients History and Physical, chart, labs and discussed the procedure including the risks, benefits and alternatives for the proposed anesthesia with the patient or authorized representative who has indicated his/her understanding and acceptance.   Dental Advisory Given  Plan Discussed with: Anesthesiologist, CRNA and Surgeon  Anesthesia Plan Comments:         Anesthesia Quick Evaluation

## 2015-09-17 NOTE — Transfer of Care (Signed)
Immediate Anesthesia Transfer of Care Note  Patient: [name]  Procedure(s) Performed: Procedure(s): TOTAL HIP ARTHROPLASTY (Left)  Patient Location: PACU  Anesthesia Type:Spinal  Level of Consciousness: awake and patient cooperative  Airway & Oxygen Therapy: Patient Spontanous Breathing and Patient connected to nasal cannula oxygen  Post-op Assessment: Report given to RN and Post -op Vital signs reviewed and stable  Post vital signs: Reviewed and stable  Last Vitals:  Filed Vitals:   09/17/15 0632  BP: 115/48  Pulse: 88  Temp: 36.9 C  Resp: 16    Complications: No apparent anesthesia complications

## 2015-09-18 ENCOUNTER — Ambulatory Visit: Payer: Medicare Other

## 2015-09-18 LAB — CBC
HEMATOCRIT: 31.9 % — AB (ref 35.0–47.0)
HEMOGLOBIN: 10.9 g/dL — AB (ref 12.0–16.0)
MCH: 30.2 pg (ref 26.0–34.0)
MCHC: 34.2 g/dL (ref 32.0–36.0)
MCV: 88.3 fL (ref 80.0–100.0)
Platelets: 220 10*3/uL (ref 150–440)
RBC: 3.61 MIL/uL — AB (ref 3.80–5.20)
RDW: 13.3 % (ref 11.5–14.5)
WBC: 7.6 10*3/uL (ref 3.6–11.0)

## 2015-09-18 LAB — BASIC METABOLIC PANEL
ANION GAP: 4 — AB (ref 5–15)
BUN: 6 mg/dL (ref 6–20)
CALCIUM: 8.2 mg/dL — AB (ref 8.9–10.3)
CHLORIDE: 111 mmol/L (ref 101–111)
CO2: 24 mmol/L (ref 22–32)
Creatinine, Ser: 0.59 mg/dL (ref 0.44–1.00)
GFR calc Af Amer: >60 mL/min (ref >60)
GFR calc non Af Amer: >60 mL/min (ref >60)
GLUCOSE: 100 mg/dL — AB (ref 65–99)
POTASSIUM: 3.7 mmol/L (ref 3.5–5.1)
Sodium: 139 mmol/L (ref 135–145)

## 2015-09-18 MED ORDER — OXYCODONE HCL 5 MG PO TABS: 5.0000 mg | ORAL_TABLET | ORAL | Status: DC | PRN

## 2015-09-18 MED ORDER — NYSTATIN 100000 UNIT/GM EX POWD
Freq: Two times a day (BID) | CUTANEOUS | Status: DC
  Administered 2015-09-19: 09:00:00 via TOPICAL
  Filled 2015-09-18: qty 15

## 2015-09-18 MED ORDER — ENOXAPARIN SODIUM 30 MG/0.3ML ~~LOC~~ SOLN: 30.0000 mg | Freq: Two times a day (BID) | SUBCUTANEOUS | Status: DC

## 2015-09-18 MED ORDER — TRAMADOL HCL 50 MG PO TABS: 50.0000 mg | ORAL_TABLET | ORAL | Status: DC | PRN

## 2015-09-18 NOTE — Discharge Instructions (Signed)

## 2015-09-18 NOTE — Care Management (Signed)
RNCM consult received. Patient is from [facility]. CSW will follow. RNCM will continue to follow.

## 2015-09-18 NOTE — Progress Notes (Signed)
Pts temp 101.8 , rechecked 100.8.  PRN Tynenol given Pt encouraged to use incentive spirometer. ON call doc paged. Waiting on page.

## 2015-09-18 NOTE — Progress Notes (Signed)
Physical Therapy Treatment Patient Details Name: [name] MRN: [id number] DOB: 1955-12-07 Today's Date: 09/18/2015    History of Present Illness Patient is a 60 y/o female that presents for L THR on 09/17/2015. She hasa PMH including: a L above elbow amputation, previous R hip sx, EtOH abuse, neuropathy, anxiety, and depression.     PT Comments    Pt continues to be lethargic, but awakens better this afternoon and agreeable to up/out of bed. Requires increased time and assist with bed mobility and transfers, but less assist overall. Pt with improved ability to clear feet minimally although continues difficult to take several steps bed to/from bedside commode. Pt participates in Left lower extremity stand exercises and weight shift as tolerated to the left with quad set and glut set. Continue PT for progression of active range of motion, balance and all functional mobility.   Follow Up Recommendations  SNF     Equipment Recommendations       Recommendations for Other Services       Precautions / Restrictions Precautions Precautions: Posterior Hip;Fall Restrictions Weight Bearing Restrictions: Yes LLE Weight Bearing: Weight bearing as tolerated    Mobility  Bed Mobility Overal bed mobility: Needs Assistance Bed Mobility: Supine to Sit;Sit to Supine     Supine to sit: Min assist Sit to supine: Min assist   General bed mobility comments: Increased time effort, cues to adhere to posterior hip precautions. Assist to lower LLE to ground with sit and return to bed with supine  Transfers Overall transfer level: Needs assistance Equipment used: Straight cane Transfers: Sit to/from Stand Sit to Stand: Min assist         General transfer comment: cues for adherence to posterior hip precaution. Slight elevated surface from bed. Min A required from bed and bedside commode.   Ambulation/Gait Ambulation/Gait assistance: Min assist Ambulation Distance (Feet): 3 Feet Assistive  device: Straight cane Gait Pattern/deviations: Step-to pattern;Decreased step length - right;Decreased step length - left;Decreased dorsiflexion - right;Decreased dorsiflexion - left Gait velocity: slow Gait velocity interpretation: <1.8 ft/sec, indicative of risk for recurrent falls General Gait Details: Difficulty weightbearing on LLE, but improved ability to take small steps bilaterally with some floot clearance. Unsteady. 1 LOB episode requiring increased assist to steady   Science writer    Modified Rankin (Stroke Patients Only)       Balance Overall balance assessment: Needs assistance Sitting-balance support: Single extremity supported;Feet supported Sitting balance-Leahy Scale: Good     Standing balance support: Single extremity supported Standing balance-Leahy Scale: Fair                      Cognition Arousal/Alertness: Awake/alert Behavior During Therapy: WFL for tasks assessed/performed Overall Cognitive Status: Within Functional Limits for tasks assessed       Memory: Decreased recall of precautions              Exercises Total Joint Exercises Ankle Circles/Pumps: AROM;Both;Supine;Other reps (comment) (30) Quad Sets: Left;20 reps;Standing;Strengthening (with weiight shift to L) Gluteal Sets: Strengthening;Left;20 reps;Standing (with weight shift to L) Towel Squeeze: Strengthening;Both;20 reps;Supine (5 second hold) Short Arc Quad: AROM;Both;20 reps;Supine (RROM R) Heel Slides: AAROM;Left;20 reps;Supine (RROM R) Hip ABduction/ADduction: AROM;Strengthening;Left;10 reps;Standing Straight Leg Raises: AROM;Strengthening;Left;10 reps;Standing Long Arc Quad: Strengthening;AROM;Left;10 reps;Seated (2 sets) Marching in Standing: AROM;Strengthening;Left;10 reps;Standing Other Exercises Other Exercises: Set up assist for toileting    General Comments  Pertinent Vitals/Pain Pain Assessment: 0-10 Pain Score: 6   (Increased to 7 post session; tolerable at rest again) Pain Location: L hip/thigh to knee Pain Descriptors / Indicators: Aching;Sharp;Pressure;Constant Pain Intervention(s): Limited activity within patient's tolerance;Monitored during session;Premedicated before session;Ice applied    Home Living Family/patient expects to be discharged to:: Skilled nursing facility Living Arrangements: Alone                  Prior Function Level of Independence: Needs assistance      Comments: Patient has been largely wheelchair bound due to pain since January, reports she was beginning to ambulate with SPC at rehab prior to this admission.    PT Goals (current goals can now be found in the care plan section) Acute Rehab PT Goals Patient Stated Goal: "to start doing more for myself again once this pain is better" Progress towards PT goals: Progressing toward goals    Frequency  BID    PT Plan Current plan remains appropriate    Co-evaluation             End of Session Equipment Utilized During Treatment: Gait belt Activity Tolerance: Patient limited by pain;Patient limited by fatigue Patient left: in bed;with call bell/phone within reach;with family/visitor present;with SCD's reapplied (cold pack; refused alarm)     Time: [id number] PT Time Calculation (min) (ACUTE ONLY): 36 min  Charges:  $Gait Training: 8-22 mins $Therapeutic Exercise: 8-22 mins                    G Codes:      Charlaine Dalton, PTA 09/18/2015, 2:15 PM

## 2015-09-18 NOTE — Anesthesia Postprocedure Evaluation (Signed)
Anesthesia Post Note  Patient: [name]  Procedure(s) Performed: Procedure(s) (LRB): TOTAL HIP ARTHROPLASTY (Left)  Patient location during evaluation: Other Anesthesia Type: Spinal Level of consciousness: awake and alert Pain management: pain level controlled Vital Signs Assessment: post-procedure vital signs reviewed and stable Respiratory status: spontaneous breathing and respiratory function stable Cardiovascular status: blood pressure returned to baseline and stable Postop Assessment: spinal receding Anesthetic complications: no    Last Vitals:  Filed Vitals:   09/18/15 0429 09/18/15 0755  BP: 109/51 107/48  Pulse: 119 108  Temp: 37.6 C 37.5 C  Resp: 16 18    Last Pain:  Filed Vitals:   09/18/15 0801  PainSc: 8                  Doreen Salvage A

## 2015-09-18 NOTE — Progress Notes (Signed)
   Subjective: 1 Day Post-Op Procedure(s) (LRB): TOTAL HIP ARTHROPLASTY (Left) Patient reports pain as 7 on 0-10 scale.   Patient is well, and has had no acute complaints or problems We will start therapy today.  Plan is to go Rehab after hospital stay. no nausea and no vomiting Patient denies any chest pains or shortness of breath. States that the pain medicine is not working but does not appear to be in a lot of pain. Sleeping in a fetal position this am. Objective: Vital signs in last 24 hours: Temp:  [94.4 F (34.7 C)-99.6 F (37.6 C)] 99.6 F (37.6 C) (03/28 0429) Pulse Rate:  [58-119] 119 (03/28 0429) Resp:  [7-18] 16 (03/28 0429) BP: (81-113)/(36-67) 109/51 mmHg (03/28 0429) SpO2:  [92 %-100 %] 95 % (03/28 0429) well approximated incision Heels are non tender and elevated off the bed using rolled towels Intake/Output from previous day: 03/27 0701 - 03/28 0700 In: 3773 [P.O.:120; I.V.:3203; IV Piggyback:360] Out: [id number] [Urine:3625; Drains:190; Blood:200] Intake/Output this shift: Total I/O In: 90 [Other:90] Out: 3150 [Urine:3150]   Recent Labs  09/18/15 0553  HGB 10.9*    Recent Labs  09/18/15 0553  WBC 7.6  RBC 3.61*  HCT 31.9*  PLT 220    Recent Labs  09/18/15 0553  NA 139  K 3.7  CL 111  CO2 24  BUN 6  CREATININE 0.59  GLUCOSE 100*  CALCIUM 8.2*   No results for input(s): LABPT, INR in the last 72 hours.  EXAM General - Patient is Alert, Appropriate and Oriented Extremity - Neurologically intact Neurovascular intact Sensation intact distally Intact pulses distally Dorsiflexion/Plantar flexion intact Dressing - dressing C/D/I Motor Function - intact, moving foot and toes well on exam.    Past Medical History  Diagnosis Date  . Anxiety   . Neuropathy ([place])   . Insomnia   . ETOH abuse   . Depression   . Anxiety     panic anxiety disorder  . Asthma     chronic asthmatic bronchitis  . Arthritis   . Cancer ([place])     Desmoid tumor  left forearm    Assessment/Plan: 1 Day Post-Op Procedure(s) (LRB): TOTAL HIP ARTHROPLASTY (Left) Active Problems:   S/P total hip arthroplasty  Estimated body mass index is 26.25 kg/(m^2) as calculated from the following:   Height as of this encounter: 5\' 4"  (1.626 m).   Weight as of this encounter: 69.4 kg (153 lb). Advance diet Up with therapy D/C IV fluids Plan for discharge tomorrow Discharge to SNF  Labs: reviewed DVT Prophylaxis - Lovenox, Foot Pumps and TED hose Weight-Bearing as tolerated to left leg D/C O2 and Pulse OX and try on Room Air Begin working on a bowel movement Labs in am  [employer]. [place] 09/18/2015, 6:48 AM

## 2015-09-18 NOTE — Progress Notes (Signed)
MD Poggi returned page and was notified of pts temp. Order received to obtain chest xray and urine sample if temp if greater than 101.5. Nursing order placed. MD also notified that per pt "she has a yeast infection that she always gets when she is in the hospital" located in her sternal area and under breasts order received for nystatin powder. Order placed.

## 2015-09-18 NOTE — Progress Notes (Signed)
Physical Therapy Treatment Patient Details Name: [name] MRN: [id number] DOB: Apr 21, 1956 Today's Date: 09/18/2015    History of Present Illness Patient is a 60 y/o female that presents for L THR on 09/17/2015. She hasa PMH including: a L above elbow amputation, previous R hip sx, EtOH abuse, neuropathy, anxiety, and depression.     PT Comments    Pt lethargic and notes difficult night and morning with pain, nausea and sleeplessness. Pt has received morphine decreasing pain to an 8/10, but reports it is just starting to calm down from "unbearable". Opted to limit session to supine bed exercises this morning. Pt performs well with active assisted range of motion on left and resistive range of motion on right with rest breaks as needed. Plan to see pt this afternoon to progress out of bed activities, range of motion and strength. Pt encouraged to continue ankle pumps, quad sets and glut sets throughout the day.   Follow Up Recommendations  SNF     Equipment Recommendations       Recommendations for Other Services       Precautions / Restrictions Precautions Precautions: Posterior Hip;Fall Restrictions Weight Bearing Restrictions: Yes LLE Weight Bearing: Weight bearing as tolerated    Mobility  Bed Mobility               General bed mobility comments: Unable to tolerate up in bed due to pain, nausea and lethargy  Transfers                    Ambulation/Gait                 Stairs            Wheelchair Mobility    Modified Rankin (Stroke Patients Only)       Balance                                    Cognition Arousal/Alertness: Lethargic Behavior During Therapy: WFL for tasks assessed/performed Overall Cognitive Status: Within Functional Limits for tasks assessed       Memory: Decreased recall of precautions (Remembers 2 of 3)              Exercises Total Joint Exercises Ankle Circles/Pumps:  AROM;Both;Supine;Other reps (comment) (30) Quad Sets: Strengthening;Both;Supine;20 reps (5 second hold) Gluteal Sets: Strengthening;Both;Supine;20 reps (5 second hold) Towel Squeeze: Strengthening;Both;20 reps;Supine (5 second hold) Short Arc Quad: AROM;Both;20 reps;Supine (RROM R) Heel Slides: AAROM;Left;20 reps;Supine (RROM R) Hip ABduction/ADduction: AAROM;Left;20 reps;Supine (RROM R) Straight Leg Raises: AAROM;Left;10 reps;Supine (AROM R 15x) Other Exercises Other Exercises: Resistive R ham curl    General Comments        Pertinent Vitals/Pain Pain Assessment: 0-10 Pain Score: 8  Pain Location: L hip/thigh to knee Pain Descriptors / Indicators: Aching;Constant;Sharp;Pressure Pain Intervention(s): Limited activity within patient's tolerance;Monitored during session;Premedicated before session;Ice applied    Home Living                      Prior Function            PT Goals (current goals can now be found in the care plan section) Progress towards PT goals: Progressing toward goals (slowly)    Frequency  BID    PT Plan Current plan remains appropriate    Co-evaluation             End of Session  Activity Tolerance: Patient limited by pain;Patient limited by lethargy Patient left: in bed;with call bell/phone within reach;with bed alarm set;with family/visitor present;with SCD's reapplied (ice pack applied)     Time: [id number] PT Time Calculation (min) (ACUTE ONLY): 35 min  Charges:  $Therapeutic Exercise: 23-37 mins                    G Codes:      Charlaine Dalton PTA 09/18/2015, 10:44 AM

## 2015-09-18 NOTE — Clinical Social Work Note (Signed)
Clinical Social Work Assessment  Patient Details  Name: [name] MRN: [id number] Date of Birth: December 13, 1955  Date of referral:  09/18/15               Reason for consult:  Facility Placement, Other (Comment Required) (From [place] )                Permission sought to share information with:  Chartered certified accountant granted to share information::  Yes, Verbal Permission Granted  Name::      Public affairs consultant::   [place]   Relationship::     Contact Information:     Housing/Transportation Living arrangements for the past 2 months:  [place] of Information:  Patient, Adult Children Patient Interpreter Needed:  None Criminal Activity/Legal Involvement Pertinent to Current Situation/Hospitalization:  No - Comment as needed Significant Relationships:  Adult Children Lives with:  Facility Resident Do you feel safe going back to the place where you live?  Yes Need for family participation in patient care:  Yes (Comment)  Care giving concerns:  Patient is a long term care resident at [facility].    Social Worker assessment / plan: Holiday representative (CSW) received SNF consult. Patient had a planned hip surgery with Dr. Marry Guan on 09/17/15. PT is recommending SNF. CSW met with patient and her daughter [name] was at bedside. CSW is familiar with patient from [facility]. Patient reported that she is a long term care resident at [facility] and has been there since August 2016. Patient reported that her plan is to return to [facility]. CSW made patient aware that if she has a 3 night inpatient qualifying stay at [facility] then her Medicare will pay for days 1-20 at 100%. If patient does not have a 3 night stay she can return to [place] under her Medicaid. Patient verbalized her understanding. CSW made patient aware that she may be ready for D/C Wednesday or Thursday. Patient will return to  SNF via EMS. Per [name] admissions coordinator at [facility] patient can return when stable for D/C.     Employment status:  Disabled (Comment on whether or not currently receiving Disability), Retired Forensic scientist:  Information systems manager, Medicaid In [place] PT Recommendations:  [place] / Referral to community resources:  [place]  Patient/Family's Response to care:  Patient is agreeable to return to [facility].   Patient/Family's Understanding of and Emotional Response to Diagnosis, Current Treatment, and Prognosis:  Patient was pleasant and thanked CSW for visit.   Emotional Assessment Appearance:  Appears stated age Attitude/Demeanor/Rapport:    Affect (typically observed):  Accepting, Adaptable, Pleasant Orientation:  Oriented to Self, Oriented to Place, Oriented to  Time, Oriented to Situation Alcohol / Substance use:  Not Applicable Psych involvement (Current and /or in the community):  No (Comment)  Discharge Needs  Concerns to be addressed:  Discharge Planning Concerns Readmission within the last 30 days:  No Current discharge risk:  Dependent with Mobility Barriers to Discharge:  Continued Medical Work up   Loralyn Freshwater, LCSW 09/18/2015, 10:42 AM

## 2015-09-18 NOTE — NC FL2 (Signed)
[place] LEVEL OF CARE SCREENING TOOL     IDENTIFICATION  Patient Name: [name] Birthdate: 1956/01/02 Sex: female Admission Date (Current Location): 09/17/2015  [place] and [place] Number:  Selena Lesser  ([id number] [facility]) Facility and Address:  [facility], [address], [place], [place] [address]      Provider Number: [id number]  Attending Physician Name and Address:  Dereck Leep, MD  Relative Name and Phone Number:       Current Level of Care: Hospital Recommended Level of Care: [place] Prior Approval Number:    Date Approved/Denied:   PASRR Number:  ( [id number] )  Discharge Plan: SNF    Current Diagnoses: Patient Active Problem List   Diagnosis Date Noted  . S/P total hip arthroplasty 09/17/2015  . Severe recurrent major depression without psychotic features ([place]) 07/03/2015  . Alcohol use disorder, severe, dependence ([place]) 01/26/2015  . Tobacco use disorder 01/26/2015  . Alcohol withdrawal ([place]) 01/08/2015  . 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 GAD (generalized anxiety disorder) 12/24/2013  . PTSD (post-traumatic stress disorder) 12/22/2013    Orientation RESPIRATION BLADDER Height & Weight     Self, Time, Situation, Place  Normal Continent Weight: 153 lb (69.4 kg) Height:  5\' 4"  (162.6 cm)  BEHAVIORAL SYMPTOMS/MOOD NEUROLOGICAL BOWEL NUTRITION STATUS   (none )  (none ) Continent Diet (Regular Diet )  AMBULATORY STATUS COMMUNICATION OF NEEDS Skin   Extensive Assist Verbally Surgical wounds (Incision: Left Hip. )                       Personal Care Assistance Level of Assistance  Bathing, Feeding, Dressing Bathing Assistance: Limited assistance Feeding assistance: Independent Dressing Assistance: Limited assistance     Functional Limitations Info  Sight, Hearing, Speech Sight Info: Adequate Hearing Info: Adequate Speech Info: Adequate    SPECIAL CARE  FACTORS FREQUENCY  PT (By licensed PT), OT (By licensed OT)     PT Frequency:  (5) OT Frequency:  (5)            Contractures      Additional Factors Info  Code Status, Allergies Code Status Info:  (Full Code. ) Allergies Info:  (Benadryl, Benadryl, Cephalosporins, Codeine, Codeine)           Current Medications (09/18/2015):  This is the current hospital active medication list Current Facility-Administered Medications  Medication Dose Route Frequency Provider Last Rate Last Dose  . 0.9 %  sodium chloride infusion   Intravenous Continuous Dereck Leep, MD 100 mL/hr at 09/17/15 1657    . acetaminophen (OFIRMEV) IV 1,000 mg  1,000 mg Intravenous 4 times per day Dereck Leep, MD   1,000 mg at 09/18/15 0100  . acetaminophen (TYLENOL) tablet 650 mg  650 mg Oral Q6H PRN Dereck Leep, MD       Or  . acetaminophen (TYLENOL) suppository 650 mg  650 mg Rectal Q6H PRN Dereck Leep, MD      . albuterol (PROVENTIL) (2.5 MG/3ML) 0.083% nebulizer solution 2.5 mg  2.5 mg Nebulization Q6H PRN Dereck Leep, MD      . alum & mag hydroxide-simeth (MAALOX/MYLANTA) 200-200-20 MG/5ML suspension 30 mL  30 mL Oral Q4H PRN Dereck Leep, MD      . bisacodyl (DULCOLAX) suppository 10 mg  10 mg Rectal Daily PRN Dereck Leep, MD      . celecoxib (CELEBREX) capsule 200  mg  200 mg Oral Q12H Dereck Leep, MD   200 mg at 09/18/15 [id number]  . enoxaparin (LOVENOX) injection 30 mg  30 mg Subcutaneous Q12H Dereck Leep, MD   30 mg at 09/18/15 0818  . ferrous sulfate tablet 325 mg  325 mg Oral BID WC Dereck Leep, MD   325 mg at 09/18/15 [id number]  . fluticasone (FLONASE) 50 MCG/ACT nasal spray 2 spray  2 spray Each Nare Daily Dereck Leep, MD   2 spray at 09/18/15 0819  . folic acid (FOLVITE) tablet 1 mg  1 mg Oral QHS Dereck Leep, MD   1 mg at 09/17/15 2146  . gabapentin (NEURONTIN) capsule 300 mg  300 mg Oral TID Dereck Leep, MD   300 mg at 09/18/15 0819  . guaiFENesin (ROBITUSSIN) 100 MG/5ML  solution 400 mg  20 mL Oral Q6H PRN Dereck Leep, MD      . magnesium hydroxide (MILK OF MAGNESIA) suspension 30 mL  30 mL Oral Daily PRN Dereck Leep, MD      . menthol-cetylpyridinium (CEPACOL) lozenge 3 mg  1 lozenge Oral PRN Dereck Leep, MD       Or  . phenol (CHLORASEPTIC) mouth spray 1 spray  1 spray Mouth/Throat PRN Dereck Leep, MD      . metoCLOPramide (REGLAN) tablet 10 mg  10 mg Oral TID AC & HS Dereck Leep, MD   10 mg at 09/18/15 0820  . morphine 2 MG/ML injection 2 mg  2 mg Intravenous Q2H PRN Dereck Leep, MD   2 mg at 09/17/15 1140  . nicotine (NICODERM CQ - dosed in mg/24 hours) patch 21 mg  21 mg Transdermal Daily Dereck Leep, MD   21 mg at 09/18/15 0824  . ondansetron (ZOFRAN) tablet 4 mg  4 mg Oral Q6H PRN Dereck Leep, MD       Or  . ondansetron (ZOFRAN) injection 4 mg  4 mg Intravenous Q6H PRN Dereck Leep, MD      . oxyCODONE (Oxy IR/ROXICODONE) immediate release tablet 5-10 mg  5-10 mg Oral Q4H PRN Dereck Leep, MD   10 mg at 09/18/15 0511  . propranolol (INDERAL) tablet 10 mg  10 mg Oral TID Dereck Leep, MD   10 mg at 09/17/15 1243  . senna-docusate (Senokot-S) tablet 1 tablet  1 tablet Oral BID Dereck Leep, MD   1 tablet at 09/18/15 0820  . sodium phosphate (FLEET) 7-19 GM/118ML enema 1 enema  1 enema Rectal Once PRN Dereck Leep, MD      . thiamine (VITAMIN B-1) tablet 100 mg  100 mg Oral QHS Dereck Leep, MD   100 mg at 09/17/15 2146  . traMADol (ULTRAM) tablet 50-100 mg  50-100 mg Oral Q4H PRN Dereck Leep, MD   100 mg at 09/18/15 0735  . traZODone (DESYREL) tablet 25 mg  25 mg Oral TID WC Dereck Leep, MD   25 mg at 09/18/15 0820  . traZODone (DESYREL) tablet 300 mg  300 mg Oral QHS Dereck Leep, MD   300 mg at 09/17/15 2146  . venlafaxine XR (EFFEXOR-XR) 24 hr capsule 150 mg  150 mg Oral Q breakfast Dereck Leep, MD   150 mg at 09/18/15 [id number]     Discharge Medications: Please see discharge summary for a list of discharge  medications.  Relevant Imaging Results:  Relevant Lab Results:  Additional Information  (SSN: [id number])  Loralyn Freshwater, LCSW

## 2015-09-18 NOTE — Evaluation (Signed)
Occupational Therapy Evaluation Patient Details Name: [name] MRN: [id number] DOB: Jan 29, 1956 Today's Date: 09/18/2015    History of Present Illness Patient is a 60 y/o female that presents for L THR on 09/17/2015. She hasa PMH including: a L above elbow amputation, previous R hip sx, EtOH abuse, neuropathy, anxiety, and depression.    Clinical Impression   Pt is 60 year old female s/p L THR(posterior) who lives at home alone. Pt was able to complete ADLs from wheelchair level prior to surgery and was starting to use a SPC for ambulation.  She has an above elbow amputation on LUE that has been present for 18 years.  Pt is currently limited in functional ADLs due to pain, decreased ROM, and nausea.  Pt requires maximum assist for LB dressing and bathing skills due to pain and decreased AROM of L LE and use of one UE due to above elbow amputation on L.  She would benefit from continued skilled OT services for education in assistive devices, functional mobility, and education in recommendations for home modifications to increase safety and prevent falls.  Pt is a good candidate for SNF to continue rehabilitation since she lives at home alone. Her son was present for evaluation but lives in [place], [place].      Follow Up Recommendations  SNF    Equipment Recommendations       Recommendations for Other Services       Precautions / Restrictions Precautions Precautions: Posterior Hip;Fall Restrictions Weight Bearing Restrictions: Yes LLE Weight Bearing: Weight bearing as tolerated      Mobility Bed Mobility               General bed mobility comments: Unable to tolerate up in bed due to pain, nausea and lethargy  Transfers                      Balance                                            ADL Overall ADL's : Needs assistance/impaired                                     Functional mobility during ADLs: Maximal  assistance General ADL Comments: Pt currently requires max assist for LB dressing skills using reacher and sock aid due to pain in L hip and amputation above elbow on L so all tasks are one handed.  Pt requested not to get out of bed since she was up earlier with PT and pain was terrible.  Demonstrated use of reacher and sock aid and pt able to use RUE to manipulate reacher and compensated with putting sock aid between her legs to hold to don sock.  Will practice further use of AD while stting up in chair in next 1-2 days.     Vision     Perception     Praxis      Pertinent Vitals/Pain Pain Assessment: 0-10 Pain Score: 8  Pain Location: L hip Pain Descriptors / Indicators: Aching;Constant;Pressure Pain Intervention(s): Limited activity within patient's tolerance;Monitored during session;Premedicated before session;Ice applied     Hand Dominance Right   Extremity/Trunk Assessment Upper Extremity Assessment Upper Extremity Assessment: LUE deficits/detail LUE Deficits / Details: Above elbow amputation 18  years ago           Communication Communication Communication: No difficulties   Cognition Arousal/Alertness: Awake/alert Behavior During Therapy: WFL for tasks assessed/performed Overall Cognitive Status: Within Functional Limits for tasks assessed       Memory: Decreased recall of precautions             General Comments       Exercises Exercises: Total Joint;Other exercises Other Exercises Other Exercises: Resistive R ham curl   Shoulder Instructions      Home Living Family/patient expects to be discharged to:: Skilled nursing facility Living Arrangements: Alone                                      Prior Functioning/Environment Level of Independence: Needs assistance        Comments: Patient has been largely wheelchair bound due to pain since January, reports she was beginning to ambulate with SPC at rehab prior to this admission.      OT Diagnosis: Acute pain;Other (comment) (LUE above elbow amputation X 18 years ago)   OT Problem List: Pain;Decreased knowledge of precautions;Decreased activity tolerance;Decreased knowledge of use of DME or AE   OT Treatment/Interventions: Self-care/ADL training;Therapeutic activities    OT Goals(Current goals can be found in the care plan section) Acute Rehab OT Goals Patient Stated Goal: "to start doing more for myself again once this pain is better" OT Goal Formulation: With patient Time For Goal Achievement: 10/02/15 Potential to Achieve Goals: Good ADL Goals Pt Will Perform Lower Body Dressing: with min assist;with adaptive equipment;sit to/from stand (using one handed tech) Pt Will Perform Toileting - Clothing Manipulation and hygiene: with min assist;sit to/from stand (using one handed tech)  OT Frequency: Min 1X/week   Barriers to D/C:            Co-evaluation              End of Session    Activity Tolerance: Patient tolerated treatment well Patient left: in bed;with call bell/phone within reach;with bed alarm set;with family/visitor present   Time: 1030-1103 OT Time Calculation (min): 33 min Charges:  OT General Charges $OT Visit: 1 Procedure OT Evaluation $OT Eval Moderate Complexity: 1 Procedure OT Treatments $Self Care/Home Management : 8-22 mins G-Codes:      Chrys Racer, OTR/L ascom [phone]  09/18/2015, 1:01 PM

## 2015-09-19 LAB — CBC
HCT: 31.5 % — ABNORMAL LOW (ref 35.0–47.0)
Hemoglobin: 10.8 g/dL — ABNORMAL LOW (ref 12.0–16.0)
MCH: 30.5 pg (ref 26.0–34.0)
MCHC: 34.4 g/dL (ref 32.0–36.0)
MCV: 88.9 fL (ref 80.0–100.0)
PLATELETS: 215 10*3/uL (ref 150–440)
RBC: 3.55 MIL/uL — ABNORMAL LOW (ref 3.80–5.20)
RDW: 13.2 % (ref 11.5–14.5)
WBC: 9.5 10*3/uL (ref 3.6–11.0)

## 2015-09-19 LAB — BASIC METABOLIC PANEL
Anion gap: 7 (ref 5–15)
BUN: 7 mg/dL (ref 6–20)
CHLORIDE: 102 mmol/L (ref 101–111)
CO2: 24 mmol/L (ref 22–32)
Calcium: 8 mg/dL — ABNORMAL LOW (ref 8.9–10.3)
Creatinine, Ser: 0.68 mg/dL (ref 0.44–1.00)
GFR calc Af Amer: >60 mL/min (ref >60)
GFR calc non Af Amer: >60 mL/min (ref >60)
GLUCOSE: 80 mg/dL (ref 65–99)
Potassium: 3.7 mmol/L (ref 3.5–5.1)
SODIUM: 133 mmol/L — AB (ref 135–145)

## 2015-09-19 LAB — SURGICAL PATHOLOGY

## 2015-09-19 NOTE — Care Management Important Message (Signed)
Important Message  Patient Details  Name: [name] MRN: [id number] Date of Birth: [birth year]   Medicare Important Message Given:  Yes    Marshell Garfinkel, RN 09/19/2015, 8:16 AM

## 2015-09-19 NOTE — Progress Notes (Signed)
   Subjective: 2 Days Post-Op Procedure(s) (LRB): TOTAL HIP ARTHROPLASTY (Left) Patient reports pain as 7 on 0-10 scale.  However it appears that the patient slept all night. Still does not appear to be in a lot of discomfort. Taking oxycodone on a regular basis. Had 1 dose of morphine yesterday. Not utilizing the tramadol as is supposed to be. Patient is well, and has had no acute complaints or problems Continue with therapy today.  Plan is to go Rehab after hospital stay. no nausea and no vomiting Patient denies any chest pains or shortness of breath. Objective: Vital signs in last 24 hours: Temp:  [99.5 F (37.5 C)-101.3 F (38.5 C)] 99.8 F (37.7 C) (03/29 0321) Pulse Rate:  [91-115] 91 (03/29 0321) Resp:  [17-19] 17 (03/29 0321) BP: (104-121)/(40-51) 115/40 mmHg (03/29 0321) SpO2:  [92 %-94 %] 94 % (03/29 0321) well approximated incision Heels are non tender and elevated off the bed using rolled towels Intake/Output from previous day: 03/28 0701 - 03/29 0700 In: 2573.3 [P.O.:360; I.V.:2213.3] Out: 3075 [Urine:2955; Drains:120] Intake/Output this shift:     Recent Labs  09/18/15 0553 09/19/15 0623  HGB 10.9* 10.8*    Recent Labs  09/18/15 0553 09/19/15 0623  WBC 7.6 9.5  RBC 3.61* 3.55*  HCT 31.9* 31.5*  PLT 220 215    Recent Labs  09/18/15 0553 09/19/15 0623  NA 139 133*  K 3.7 3.7  CL 111 102  CO2 24 24  BUN 6 7  CREATININE 0.59 0.68  GLUCOSE 100* 80  CALCIUM 8.2* 8.0*   No results for input(s): LABPT, INR in the last 72 hours.  EXAM General - Patient is Alert, Appropriate and Oriented Extremity - Neurologically intact Neurovascular intact Sensation intact distally Intact pulses distally Dorsiflexion/Plantar flexion intact Dressing - dressing C/D/I Motor Function - intact, moving foot and toes well on exam. Rolls over onto her right side independently and with no difficulty.  Past Medical History  Diagnosis Date  . Anxiety   .  Neuropathy ([place])   . Insomnia   . ETOH abuse   . Depression   . Anxiety     panic anxiety disorder  . Asthma     chronic asthmatic bronchitis  . Arthritis   . Cancer ([place])     Desmoid tumor left forearm    Assessment/Plan: 2 Days Post-Op Procedure(s) (LRB): TOTAL HIP ARTHROPLASTY (Left) Active Problems:   S/P total hip arthroplasty  Estimated body mass index is 26.25 kg/(m^2) as calculated from the following:   Height as of this encounter: 5\' 4"  (1.626 m).   Weight as of this encounter: 69.4 kg (153 lb). Up with therapy Plan for discharge tomorrow Discharge to SNF. At the current time patient is not safe to go to rehabilitation as of this dictation.  Labs: Pending DVT Prophylaxis - Lovenox, Foot Pumps and TED hose Weight-Bearing as tolerated to left leg Patient needs a bowel movement today. Hemovac was discontinued on today's visit. Need to be a little bit more aggressive with patient's ambulation. Patient accustomed to not doing a lot of ambulation. Morphine discontinued Please alternate tramadol with the oxycodone on help with her discomfort.  [name]. [name] 09/19/2015, 7:01 AM

## 2015-09-19 NOTE — Progress Notes (Signed)
Physical Therapy Treatment Patient Details Name: [name] MRN: [id number] DOB: 1956/05/11 Today's Date: 09/19/2015    History of Present Illness Patient is a 60 y/o female that presents for L THR on 09/17/2015. She hasa PMH including: a L above elbow amputation, previous R hip sx, EtOH abuse, neuropathy, anxiety, and depression.     PT Comments    Attempted treatment initially earlier this morning; pt soundly sleeping and unable to fully awaken. Second attempt, pt continues to sleep, but able to awaken and agreeable to PT. Pt has had some difficulty with fever, but does not currently. Discussed benefits of use of hemi-walker for increased support for ambulation versus single point cane. Pt agreeable. Pt demonstrating continued difficulty with Left lower extremity mobility to and over edge of bed, but with increased/time effort is able to do so with support given to left only as pt scoots hips to edge of bed. Pt able to stand today without elevating bed. Encouraged stand weight shift with quad/glut set in preparation for ambulation. Pt demonstrates improved ambulation quality and distance as well as balance with use of hemi-walker. Pt fatigues quickly due to general malaise and weakness. Pt received up in chair and encouraged to remain up in chair for improved strength, endurance and breathing. Reviewed and encouraged use of incentive spirometer and also encouraged ankle pumps and quad/glut sets every hour. Hemi-walker left in room for nursing staff to use with pt. Instructed pt to ambulate to bathroom with staff versus using the bedside commode; pt understands and is agreeable. Continue PT to progress strength, endurance, ambulation quality and distance and balance for improved functional mobility.   Follow Up Recommendations  SNF     Equipment Recommendations       Recommendations for Other Services       Precautions / Restrictions Precautions Precautions: Posterior  Hip;Fall Restrictions Weight Bearing Restrictions: Yes LLE Weight Bearing: Weight bearing as tolerated    Mobility  Bed Mobility Overal bed mobility: Needs Assistance Bed Mobility: Supine to Sit     Supine to sit: Min assist     General bed mobility comments: Increased time for trunk elevation, but able to with use of rail. Struggles to adduct LLE to edge of bed, but does so with increased time. Min A for LLE over the edge of the bed.   Transfers Overall transfer level: Needs assistance Equipment used: Hemi-walker Transfers: Sit to/from Stand Sit to Stand: Min guard         General transfer comment: Improved ability today with complaints of pain. Good adherence to posterior hip precaution without cueing today. Does not require bed elevation today.   Ambulation/Gait Ambulation/Gait assistance: Min guard Ambulation Distance (Feet): 35 Feet Assistive device: Hemi-walker Gait Pattern/deviations: Step-to pattern;Antalgic Gait velocity: slow Gait velocity interpretation: <1.8 ft/sec, indicative of risk for recurrent falls General Gait Details: Improved stability and ability to ambulate with hemiwalker today. Mild increased pain with ambulation, but subsides with seated rest. Fatigues   Stairs            Wheelchair Mobility    Modified Rankin (Stroke Patients Only)       Balance Overall balance assessment: Needs assistance Sitting-balance support: Single extremity supported;Feet supported Sitting balance-Leahy Scale: Good     Standing balance support: Single extremity supported Standing balance-Leahy Scale: Fair                      Cognition Arousal/Alertness: Lethargic (Able to awaken second attempt for PT)  Behavior During Therapy: WFL for tasks assessed/performed Overall Cognitive Status: Within Functional Limits for tasks assessed       Memory:  (Improved recall of hip precautions)              Exercises Total Joint Exercises Ankle  Circles/Pumps: AROM;Both;20 reps (long sit) Quad Sets: Strengthening;Left;10 reps;Standing (also 20 reps in long sit) Gluteal Sets: Strengthening;Left;10 reps;Standing (also 20 reps long sit) Other Exercises Other Exercises: reviewed/encouraged use of incentive spirometer    General Comments General comments (skin integrity, edema, etc.): Drain removed      Pertinent Vitals/Pain Pain Assessment: 0-10 Pain Score: 5  Pain Location: L hip/thigh Pain Descriptors / Indicators: Aching;Constant;Pressure;Sharp Pain Intervention(s): Monitored during session;Premedicated before session;Repositioned    Home Living                      Prior Function            PT Goals (current goals can now be found in the care plan section) Progress towards PT goals: Progressing toward goals    Frequency  BID    PT Plan Current plan remains appropriate    Co-evaluation             End of Session Equipment Utilized During Treatment: Gait belt Activity Tolerance: Patient tolerated treatment well;Patient limited by fatigue;Patient limited by pain (General malaise limiting, but improving ) Patient left: in chair;with call bell/phone within reach;with chair alarm set     Time: [id number] PT Time Calculation (min) (ACUTE ONLY): 23 min  Charges:  $Gait Training: 8-22 mins $Therapeutic Exercise: 8-22 mins                    G Codes:      Charlaine Dalton, PTA 09/19/2015, 12:21 PM

## 2015-09-19 NOTE — Progress Notes (Signed)
Pts BP 105/41, HR 93. PA Vance Peper notified. Per Wille Glaser hold BP meds.

## 2015-09-19 NOTE — Progress Notes (Signed)
Physical Therapy Treatment Patient Details Name: [name] MRN: [id number] DOB: Feb 08, 1956 Today's Date: 09/19/2015    History of Present Illness Patient is a 60 y/o female that presents for L THR on 09/17/2015. She hasa PMH including: a L above elbow amputation, previous R hip sx, EtOH abuse, neuropathy, anxiety, and depression.     PT Comments    Pt gradually progressing towards functional goals. Pain is well managed. Requires min A for bed mobility, primarily for L LE. Min guard needed for transfers and ambulation up to 70 ft with HW. Slow but steady gait with 1 LOB and self correction. Cues for increasing hip/knee flexion during swing phase of gait on L LE with heel to toe pattern, as pt tends to keep knee extended with very little hip flexion. Gait improved with practice and cues. Pt tended to hold her breath requiring cues for pursed lip breathing. HR elevated up to 130 briefly and SpO2 desat to 91% on RA, but quickly improved with pursed lip breathing and rest. RN notified. Pt tolerated treatment well and she will benefit from continued skilled PT to increase functional I and return to PLOF.  Follow Up Recommendations  SNF     Equipment Recommendations       Recommendations for Other Services       Precautions / Restrictions Precautions Precautions: Posterior Hip;Fall Restrictions Weight Bearing Restrictions: Yes LLE Weight Bearing: Weight bearing as tolerated    Mobility  Bed Mobility Overal bed mobility: Needs Assistance Bed Mobility: Supine to Sit;Sit to Supine     Supine to sit: Min assist Sit to supine: Min assist   General bed mobility comments: difficulty moving L LE, cues for maintaining hip precautions  Transfers Overall transfer level: Needs assistance Equipment used: Hemi-walker Transfers: Sit to/from [facility] Sit to Stand: Min guard Stand pivot transfers: Min guard       General transfer comment: cues for hand placement, hip  precautions, getting L foot position to be able to weight shift equally to stand  Ambulation/Gait Ambulation/Gait assistance: Min guard Ambulation Distance (Feet): 70 Feet Assistive device: Hemi-walker Gait Pattern/deviations: Step-to pattern;Decreased weight shift to left;Antalgic Gait velocity: slow Gait velocity interpretation: <1.8 ft/sec, indicative of risk for recurrent falls General Gait Details: Demonstrated decreased L hip and knee flexion, reduced weight shift on L LE, slow with no one LOB with self correction.   Stairs            Wheelchair Mobility    Modified Rankin (Stroke Patients Only)       Balance Overall balance assessment: Needs assistance Sitting-balance support: Single extremity supported;Feet supported Sitting balance-Leahy Scale: Good     Standing balance support: Single extremity supported Standing balance-Leahy Scale: Fair Standing balance comment: maintains static balance independently ; dynamic balance occasionally unsteady requiring cues for increasing BOS and correcting weight shifting                     Cognition Arousal/Alertness: Awake/alert Behavior During Therapy: WFL for tasks assessed/performed Overall Cognitive Status: Within Functional Limits for tasks assessed       Memory: Decreased recall of precautions              Exercises Total Joint Exercises Ankle Circles/Pumps: AROM;Both;20 reps (long sit) Quad Sets: Strengthening;Left;10 reps;Standing (also 20 reps in long sit) Gluteal Sets: Strengthening;Left;10 reps;Standing (also 20 reps long sit) Other Exercises Other Exercises: Reviewed posterior hip precautions with pt recalling 2/3. Understood importance of maintaining precautions to  protect hip. Other Exercises: L LE therex: supine: ankle pumps (B), QS (B), heel slides (within precautions), hip abd slides (within precautions); seated: LAQs x10 each with AAROM. Cues for proper technique and maintaining hip  precautions. No increase pain.    General Comments General comments (skin integrity, edema, etc.): Drain removed      Pertinent Vitals/Pain Pain Assessment: 0-10 Pain Score: 4  Pain Location: L hip Pain Descriptors / Indicators: Aching Pain Intervention(s): Limited activity within patient's tolerance;Monitored during session;Repositioned    Home Living                      Prior Function            PT Goals (current goals can now be found in the care plan section) Acute Rehab PT Goals PT Goal Formulation: With patient Time For Goal Achievement: 10/01/15 Potential to Achieve Goals: Good Progress towards PT goals: Progressing toward goals    Frequency  BID    PT Plan Current plan remains appropriate    Co-evaluation             End of Session Equipment Utilized During Treatment: Gait belt Activity Tolerance: Patient tolerated treatment well;Patient limited by fatigue;Patient limited by pain Patient left: in bed;with call bell/phone within reach;with bed alarm set;with SCD's reapplied (pillow placed to maintain hip precautions)     Time: [id number] PT Time Calculation (min) (ACUTE ONLY): 26 min  Charges:  $Gait Training: 8-22 mins $Therapeutic Exercise: 8-22 mins                    G Codes:      Neoma Laming, PT, DPT  09/19/2015, 3:36 PM [phone]

## 2015-09-19 NOTE — Progress Notes (Signed)
Per PA note plan is for patient to D/C to [facility] tomorrow 09/20/15 pending medical clearance. Patient will have a 3 night inpatient qualifying stay. [facility] admissions coordinator at [facility] is aware of above. Clinical Social Worker (CSW) will continue to follow and assist as needed.   Blima Rich, LCSW [phone]

## 2015-09-19 NOTE — Progress Notes (Signed)
OT Cancellation Note  Patient Details Name: [name] MRN: [id number] DOB: 05/12/1956   Cancelled Treatment:     Attempted to see patient this am however she reports having a fever, not feeling well and in increased pain.  Nursing to bring in more pain meds soon.  Will reattempt to see patient for treatment.  Caylan Chenard T Ariabella Brien, OTR/L, CLT  Nidia Grogan 09/19/2015, 9:03 AM

## 2015-09-20 NOTE — Progress Notes (Signed)
PTs BP 97/40 at discharge. Per PA Marylee Floras this am, pt "ok to d/c with BP in that range since she has been running on the lower side". EMS here to transport pt.

## 2015-09-20 NOTE — Progress Notes (Signed)
Patient is medically stable for D/C to [facility] today. Per [facility] admissions coordinator at [facility] patient will go back to room 27-A. RN will call report and arrange EMS for transport. Helene Kelp picked up patient's flowers because EMS cannot transport them. Clinical Education officer, museum (CSW) sent D/C Summary, FL2 and D/C Packet to [employer] via [employer]. Patient is aware of above. Per patient her family is aware of D/C today. Please reconsult if future social work needs arise. CSW signing off.   Blima Rich, LCSW [phone]

## 2015-09-20 NOTE — Discharge Summary (Signed)
Physician Discharge Summary  Patient ID: [name] MRN: [id number] DOB/AGE: September 10, 1955 60 y.o.  Admit date: 09/17/2015 Discharge date: 09/20/2015  Admission Diagnoses:  PRIMARY OSTEOARTHRITIS   Discharge Diagnoses: Patient Active Problem List   Diagnosis Date Noted  . S/P total hip arthroplasty 09/17/2015  . Severe recurrent major depression without psychotic features ([place]) 07/03/2015  . Alcohol use disorder, severe, dependence ([place]) 01/26/2015  . Tobacco use disorder 01/26/2015  . Alcohol withdrawal ([place]) 01/08/2015  . 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 GAD (generalized anxiety disorder) 12/24/2013  . PTSD (post-traumatic stress disorder) 12/22/2013    Past Medical History  Diagnosis Date  . Anxiety   . Neuropathy ([place])   . Insomnia   . ETOH abuse   . Depression   . Anxiety     panic anxiety disorder  . Asthma     chronic asthmatic bronchitis  . Arthritis   . Cancer [facility])     Desmoid tumor left forearm     Transfusion: No transfusions given doing this admission   Consultants (if any):   case management for placement assistance  Discharged Condition: Improved  Hospital Course: CALLISTO WENDEL is an 60 y.o. female who was admitted 09/17/2015 with a diagnosis of degenerative arthrosis left hip and went to the operating room on 09/17/2015 and underwent the above named procedures.    Surgeries:Procedure(s): TOTAL HIP ARTHROPLASTY on 09/17/2015  PRE-OPERATIVE DIAGNOSIS: Degenerative arthrosis of the left hip, primary  POST-OPERATIVE DIAGNOSIS: Same  PROCEDURE: Left total hip arthroplasty  SURGEON: Marciano Sequin. M.D.  ASSISTANT: Vance Peper, PA (present and scrubbed throughout the case, critical for assistance with exposure, retraction, instrumentation, and closure)  ANESTHESIA: spinal  ESTIMATED BLOOD LOSS: 200 mL  FLUIDS REPLACED: 1800 mL of crystalloid  DRAINS: 2 medium drains to a Hemovac reservoir  IMPLANTS  UTILIZED: DePuy 12 mm small stature AML femoral stem, 50 mm OD Pinnacle 100 acetabular component, +4 mm neutral Pinnacle Marathon polyethylene insert, and a 32 mm CoCr +1 mm hip ball  INDICATIONS FOR SURGERY: [name] is a 60 y.o. year old female with a long history of progressive hip and groin pain. X-rays demonstrated severe degenerative changes. The patient had not seen any significant improvement despite conservative nonsurgical intervention. After discussion of the risks and benefits of surgical intervention, the patient expressed understanding of the risks benefits and agree with plans for total hip arthroplasty.   The risks, benefits, and alternatives were discussed at length including but not limited to the risks of infection, bleeding, nerve injury, stiffness, blood clots, the need for revision surgery, limb length inequality, dislocation, cardiopulmonary complications, among others, and they were willing to proceed. Patient tolerated the surgery well. No complications .Patient was taken to PACU where she was stabilized and then transferred to the orthopedic floor.  Patient started on Lovenox 30 q 12 hrs. Foot pumps applied bilaterally at 80 mm hg. Heels elevated off bed with rolled towels. No evidence of DVT. Calves non tender. Negative Homan. Physical therapy started on day #1 for gait training and transfer with OT starting on  day #1 for ADL and assisted devices. Patient has done well with therapy. Ambulated 70 feet upon being discharged.  Patient's IV and Foley were discontinued on day #1 with the Hemovac being just continued on day #2. Dressing was changed on day #3.   She was given perioperative antibiotics:  Anti-infectives    Start     Dose/Rate Route Frequency  Ordered Stop   09/17/15 1300  clindamycin (CLEOCIN) IVPB 600 mg     600 mg 100 mL/hr over 30 Minutes Intravenous Every 6 hours 09/17/15 1119 09/18/15 0848   09/17/15 0630  clindamycin (CLEOCIN) IVPB 600 mg     600  mg 100 mL/hr over 30 Minutes Intravenous  Once 09/17/15 0628 09/17/15 0751    .  She was fitted with AV 1 compression foot pump devices bilaterally, instructed on heel pumps, early ambulation, and fitted with TED stockings bilaterally for DVT prophylaxis.  She benefited maximally from the hospital stay and there were no complications.    Recent vital signs:  Filed Vitals:   09/19/15 2016 09/20/15 0350  BP: 110/49 93/43  Pulse: 112 80  Temp: 98.5 F (36.9 C) 98.2 F (36.8 C)  Resp: 16 16    Recent laboratory studies:  Lab Results  Component Value Date   HGB 10.8* 09/19/2015   HGB 10.9* 09/18/2015   HGB 13.9 09/05/2015   Lab Results  Component Value Date   WBC 9.5 09/19/2015   PLT 215 09/19/2015   Lab Results  Component Value Date   INR 0.92 09/05/2015   Lab Results  Component Value Date   NA 133* 09/19/2015   K 3.7 09/19/2015   CL 102 09/19/2015   CO2 24 09/19/2015   BUN 7 09/19/2015   CREATININE 0.68 09/19/2015   GLUCOSE 80 09/19/2015    Discharge Medications:     Medication List    TAKE these medications        acetaminophen 500 MG tablet  Commonly known as:  TYLENOL  Take 1,000 mg by mouth every 8 (eight) hours.     albuterol 108 (90 Base) MCG/ACT inhaler  Commonly known as:  PROVENTIL HFA;VENTOLIN HFA  Inhale 2 puffs into the lungs every 6 (six) hours as needed for wheezing or shortness of breath.     amoxicillin-clavulanate 875-125 MG tablet  Commonly known as:  AUGMENTIN  Take 1 tablet by mouth 2 (two) times daily. Reported on 09/17/2015     azithromycin 250 MG tablet  Commonly known as:  ZITHROMAX  Take 1 tablet (250 mg total) by mouth daily. 250 mg po q day     celecoxib 200 MG capsule  Commonly known as:  CELEBREX  Take 200 mg by mouth daily. In the late afternoon.     diclofenac sodium 1 % Gel  Commonly known as:  VOLTAREN  Apply 1 application topically every 6 (six) hours as needed.     enoxaparin 30 MG/0.3ML injection  Commonly  known as:  LOVENOX  Inject 0.3 mLs (30 mg total) into the skin every 12 (twelve) hours.     fluticasone 50 MCG/ACT nasal spray  Commonly known as:  FLONASE  Place 2 sprays into both nostrils daily. At 3 pm.     folic acid 1 MG tablet  Commonly known as:  FOLVITE  Take 1 mg by mouth at bedtime.     gabapentin 300 MG capsule  Commonly known as:  NEURONTIN  Take 300 mg by mouth 3 (three) times daily.     gabapentin 300 MG capsule  Commonly known as:  NEURONTIN  Take 3 capsules (900 mg total) by mouth 3 (three) times daily with meals.     guaiFENesin 100 MG/5ML Soln  Commonly known as:  ROBITUSSIN  Take 20 mLs by mouth every 6 (six) hours as needed for cough or to loosen phlegm.     oxyCODONE 5  MG immediate release tablet  Commonly known as:  Oxy IR/ROXICODONE  Take 1-2 tablets (5-10 mg total) by mouth every 4 (four) hours as needed for severe pain.     propranolol 10 MG tablet  Commonly known as:  INDERAL  Take 10 mg by mouth 3 (three) times daily.     propranolol 10 MG tablet  Commonly known as:  INDERAL  Take 1 tablet (10 mg total) by mouth 3 (three) times daily with meals.     thiamine 100 MG tablet  Take 100 mg by mouth at bedtime.     traMADol 50 MG tablet  Commonly known as:  ULTRAM  Take 1-2 tablets (50-100 mg total) by mouth every 4 (four) hours as needed for moderate pain.     trazodone 300 MG tablet  Commonly known as:  DESYREL  Take 300 mg by mouth at bedtime.     traZODone 50 MG tablet  Commonly known as:  DESYREL  Take 50 mg by mouth as needed (for break through anxiety).     traZODone 100 MG tablet  Commonly known as:  DESYREL  Take 25 mg by mouth 3 (three) times daily.     traZODone 150 MG tablet  Commonly known as:  DESYREL  Take 1 tablet (150 mg total) by mouth at bedtime.     venlafaxine XR 150 MG 24 hr capsule  Commonly known as:  EFFEXOR-XR  Take 150 mg by mouth daily with breakfast. 2 tablets in am.     venlafaxine XR 150 MG 24 hr  capsule  Commonly known as:  EFFEXOR-XR  Take 2 capsules (300 mg total) by mouth daily with breakfast.        Diagnostic Studies: Dg Chest 2 View  09/05/2015  CLINICAL DATA:  Cough for 2 days, chest pain, smoking history EXAM: CHEST  2 VIEW COMPARISON:  None. FINDINGS: No active infiltrate is seen. There is some blunting of the posterior costophrenic angles which could indicate very tiny pleural effusions. On the frontal view there is a vague nodular opacity at the left lung base which probably simply represents overlapping bony and vascular structures. However either retention this area on followup chest x-ray or CT the chest is recommended. Mediastinal and hilar contours are unremarkable. The heart is within normal limits in size. No bony abnormality is seen. IMPRESSION: 1. No active infiltrate.  Cannot exclude tiny pleural effusions. 2. Questionable nodular opacity at the left lung base possibly due to superimposition of vascular and bony structures but consider followup chest x-ray or CT of the chest to assess further. Electronically Signed   By: Ivar Drape M.D.   On: 09/05/2015 15:04   Dg Hip Port Unilat With Pelvis 1v Left  09/17/2015  CLINICAL DATA:  S/P total hip arthroplasty; pacu EXAM: DG HIP (WITH OR WITHOUT PELVIS) 1V PORT LEFT COMPARISON:  None. FINDINGS: Femoral and acetabular components of left hip arthroplasty project in expected location. No fracture or dislocation. Surgical drains and skin staples project laterally. IMPRESSION: 1. Left hip arthroplasty without fracture or other apparent complication. Electronically Signed   By: Lucrezia Europe M.D.   On: 09/17/2015 12:17     Disposition: 03-Skilled Nursing Facility   Discharge instructions Patient may weight-bear as tolerated. Change dressing as needed Staples are to be removed 2 weeks post op. Applied benzoin and Steri-Strips Continue Lovenox 30 mg every 12 hours for 14 days. Follow-up in [place] in 6 weeks.  Patient should already have an appointment  made. Sooner if any problems. Do not take a shower until the staples are removed Regular diet         Discharge Instructions    Diet - low sodium heart healthy    Complete by:  As directed      Diet - low sodium heart healthy    Complete by:  As directed      Increase activity slowly    Complete by:  As directed      Increase activity slowly    Complete by:  As directed            Follow-up Information    Follow up with Dereck Leep, MD On 10/30/2015.   Specialty:  Orthopedic Surgery   Why:  at 2:00pm   Contact information:   [place] [address] [phone]       Follow up with [facility] .   Specialty:  Skilled Nursing Facility   Contact information:   [place] [phone]       Signed: Watt Climes 09/20/2015, 7:33 AM

## 2015-09-20 NOTE — Progress Notes (Addendum)
Pt rested comfortably during the night. No complaints

## 2015-09-20 NOTE — NC FL2 (Signed)
[place] LEVEL OF CARE SCREENING TOOL     IDENTIFICATION  Patient Name: [name] Birthdate: May 03, 1956 Sex: female Admission Date (Current Location): 09/17/2015  [place] and [place] Number:  Selena Lesser  ([id number] [facility]) Facility and Address:  [name], [address], [place], [place] [address]      Provider Number: [id number]  Attending Physician Name and Address:  Dereck Leep, MD  Relative Name and Phone Number:       Current Level of Care: Hospital Recommended Level of Care: [place] Prior Approval Number:    Date Approved/Denied:   PASRR Number:  ( [id number] )  Discharge Plan: SNF    Current Diagnoses: Patient Active Problem List   Diagnosis Date Noted  . S/P total hip arthroplasty 09/17/2015  . Severe recurrent major depression without psychotic features ([place]) 07/03/2015  . Alcohol use disorder, severe, dependence ([place]) 01/26/2015  . Tobacco use disorder 01/26/2015  . Alcohol withdrawal ([place]) 01/08/2015  . 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 GAD (generalized anxiety disorder) 12/24/2013  . PTSD (post-traumatic stress disorder) 12/22/2013    Orientation RESPIRATION BLADDER Height & Weight     Self, Time, Situation, Place  Normal Continent Weight: 153 lb (69.4 kg) Height:  5\' 4"  (162.6 cm)  BEHAVIORAL SYMPTOMS/MOOD NEUROLOGICAL BOWEL NUTRITION STATUS   (none )  (none ) Continent Diet (Regular Diet )  AMBULATORY STATUS COMMUNICATION OF NEEDS Skin   Extensive Assist Verbally Surgical wounds (Incision: Left Hip. )                       Personal Care Assistance Level of Assistance  Bathing, Feeding, Dressing Bathing Assistance: Limited assistance Feeding assistance: Independent Dressing Assistance: Limited assistance     Functional Limitations Info  Sight, Hearing, Speech Sight Info: Adequate Hearing Info: Adequate Speech Info: Adequate    SPECIAL CARE  FACTORS FREQUENCY  PT (By licensed PT), OT (By licensed OT)     PT Frequency:  (5) OT Frequency:  (5)            Contractures      Additional Factors Info  Code Status, Allergies Code Status Info:  (Full Code. ) Allergies Info:  (Benadryl, Benadryl, Cephalosporins, Codeine, Codeine)           Current Medications (09/20/2015):  This is the current hospital active medication list Current Facility-Administered Medications  Medication Dose Route Frequency Provider Last Rate Last Dose  . 0.9 %  sodium chloride infusion   Intravenous Continuous Dereck Leep, MD   Stopped at 09/18/15 1400  . acetaminophen (TYLENOL) tablet 650 mg  650 mg Oral Q6H PRN Dereck Leep, MD   650 mg at 09/18/15 [id number]   Or  . acetaminophen (TYLENOL) suppository 650 mg  650 mg Rectal Q6H PRN Dereck Leep, MD      . albuterol (PROVENTIL) (2.5 MG/3ML) 0.083% nebulizer solution 2.5 mg  2.5 mg Nebulization Q6H PRN Dereck Leep, MD      . alum & mag hydroxide-simeth (MAALOX/MYLANTA) 200-200-20 MG/5ML suspension 30 mL  30 mL Oral Q4H PRN Dereck Leep, MD      . bisacodyl (DULCOLAX) suppository 10 mg  10 mg Rectal Daily PRN Dereck Leep, MD   10 mg at 09/19/15 1631  . celecoxib (CELEBREX) capsule 200 mg  200 mg Oral Q12H Dereck Leep, MD   200 mg at 09/20/15 0857  . enoxaparin (LOVENOX)  injection 30 mg  30 mg Subcutaneous Q12H Dereck Leep, MD   30 mg at 09/20/15 0855  . ferrous sulfate tablet 325 mg  325 mg Oral BID WC Dereck Leep, MD   325 mg at 09/20/15 0858  . fluticasone (FLONASE) 50 MCG/ACT nasal spray 2 spray  2 spray Each Nare Daily Dereck Leep, MD   2 spray at 09/20/15 0902  . folic acid (FOLVITE) tablet 1 mg  1 mg Oral QHS Dereck Leep, MD   1 mg at 09/19/15 2142  . gabapentin (NEURONTIN) capsule 300 mg  300 mg Oral TID Dereck Leep, MD   300 mg at 09/20/15 0857  . guaiFENesin (ROBITUSSIN) 100 MG/5ML solution 400 mg  20 mL Oral Q6H PRN Dereck Leep, MD   400 mg at 09/18/15 1146  .  magnesium hydroxide (MILK OF MAGNESIA) suspension 30 mL  30 mL Oral Daily PRN Dereck Leep, MD   30 mL at 09/19/15 0826  . menthol-cetylpyridinium (CEPACOL) lozenge 3 mg  1 lozenge Oral PRN Dereck Leep, MD       Or  . phenol (CHLORASEPTIC) mouth spray 1 spray  1 spray Mouth/Throat PRN Dereck Leep, MD      . nicotine (NICODERM CQ - dosed in mg/24 hours) patch 21 mg  21 mg Transdermal Daily Dereck Leep, MD   21 mg at 09/19/15 [id number]  . nystatin (MYCOSTATIN/NYSTOP) topical powder   Topical BID Corky Mull, MD      . ondansetron [facility]) tablet 4 mg  4 mg Oral Q6H PRN Dereck Leep, MD   4 mg at 09/19/15 1256   Or  . ondansetron (ZOFRAN) injection 4 mg  4 mg Intravenous Q6H PRN Dereck Leep, MD      . oxyCODONE (Oxy IR/ROXICODONE) immediate release tablet 5-10 mg  5-10 mg Oral Q4H PRN Dereck Leep, MD   5 mg at 09/20/15 [id number]  . propranolol (INDERAL) tablet 10 mg  10 mg Oral TID Dereck Leep, MD   10 mg at 09/19/15 2143  . senna-docusate (Senokot-S) tablet 1 tablet  1 tablet Oral BID Dereck Leep, MD   1 tablet at 09/20/15 0858  . sodium phosphate (FLEET) 7-19 GM/118ML enema 1 enema  1 enema Rectal Once PRN Dereck Leep, MD      . thiamine (VITAMIN B-1) tablet 100 mg  100 mg Oral QHS Dereck Leep, MD   100 mg at 09/19/15 2142  . traMADol (ULTRAM) tablet 50-100 mg  50-100 mg Oral Q4H PRN Dereck Leep, MD   50 mg at 09/19/15 0825  . traZODone (DESYREL) tablet 25 mg  25 mg Oral TID WC Dereck Leep, MD   25 mg at 09/20/15 0859  . traZODone (DESYREL) tablet 300 mg  300 mg Oral QHS Dereck Leep, MD   300 mg at 09/19/15 2142  . venlafaxine XR (EFFEXOR-XR) 24 hr capsule 150 mg  150 mg Oral Q breakfast Dereck Leep, MD   150 mg at 09/20/15 [id number]     Discharge Medications: Please see discharge summary for a list of discharge medications.  Relevant Imaging Results:  Relevant Lab Results:   Additional Information  (SSN: [id number])  Loralyn Freshwater, LCSW

## 2015-09-20 NOTE — Progress Notes (Signed)
   Subjective: 3 Days Post-Op Procedure(s) (LRB): TOTAL HIP ARTHROPLASTY (Left) Patient reports pain as 4 on 0-10 scale.   Patient is well, and has had no acute complaints or problems Continue with physical therapy today until discharged  Plan is to go Rehab after hospital stay. no nausea and no vomiting Patient denies any chest pains or shortness of breath. Objective: Vital signs in last 24 hours: Temp:  [98.2 F (36.8 C)-100 F (37.8 C)] 98.2 F (36.8 C) (03/30 0350) Pulse Rate:  [80-112] 80 (03/30 0350) Resp:  [16-18] 16 (03/30 0350) BP: (93-111)/(37-49) 93/43 mmHg (03/30 0350) SpO2:  [91 %-96 %] 93 % (03/30 0350) well approximated incision Heels are non tender and elevated off the bed using rolled towels Intake/Output from previous day: 03/29 0701 - 03/30 0700 In: 360 [P.O.:360] Out: 650 [Urine:650] Intake/Output this shift:     Recent Labs  09/18/15 0553 09/19/15 0623  HGB 10.9* 10.8*    Recent Labs  09/18/15 0553 09/19/15 0623  WBC 7.6 9.5  RBC 3.61* 3.55*  HCT 31.9* 31.5*  PLT 220 215    Recent Labs  09/18/15 0553 09/19/15 0623  NA 139 133*  K 3.7 3.7  CL 111 102  CO2 24 24  BUN 6 7  CREATININE 0.59 0.68  GLUCOSE 100* 80  CALCIUM 8.2* 8.0*   No results for input(s): LABPT, INR in the last 72 hours.  EXAM General - Patient is Alert, Appropriate and Oriented Extremity - Neurologically intact Neurovascular intact Sensation intact distally Intact pulses distally Dorsiflexion/Plantar flexion intact Dressing - dressing C/D/I Motor Function - intact, moving foot and toes well on exam.    Past Medical History  Diagnosis Date  . Anxiety   . Neuropathy ([place])   . Insomnia   . ETOH abuse   . Depression   . Anxiety     panic anxiety disorder  . Asthma     chronic asthmatic bronchitis  . Arthritis   . Cancer ([place])     Desmoid tumor left forearm    Assessment/Plan: 3 Days Post-Op Procedure(s) (LRB): TOTAL HIP ARTHROPLASTY  (Left) Active Problems:   S/P total hip arthroplasty  Estimated body mass index is 26.25 kg/(m^2) as calculated from the following:   Height as of this encounter: 5\' 4"  (1.626 m).   Weight as of this encounter: 69.4 kg (153 lb). Up with therapy Discharge to SNF after patient does physical therapy.  Labs: Reviewed. DVT Prophylaxis - Lovenox, Foot Pumps and TED hose Weight-Bearing as tolerated to right leg Change dressing prior to being discharged  Moriah Shawley R. [place] Tijeras 09/20/2015, 7:24 AM

## 2015-09-20 NOTE — Progress Notes (Signed)
Physical Therapy Treatment Patient Details Name: [name] MRN: [id number] DOB: 1956/02/01 Today's Date: 09/20/2015    History of Present Illness Patient is a 60 y/o female that presents for L THR on 09/17/2015. She hasa PMH including: a L above elbow amputation, previous R hip sx, EtOH abuse, neuropathy, anxiety, and depression.     PT Comments    Pt notes feeling better to day with decreasing pain, increasing appetite and improved alertness. Pt progressing with all mobility with improved ease, speed. Continues to require Left lower extremity assist in/out of bed against gravity. Transfers with much less assist and improved ease. Compliant with posterior hip precautions and improved ability to accept weight through Left lower extremity with transfers and ambulation although limitations continue. Pt with current discharge orders to skilled nursing facility today for continued work on strength, endurance, balance, ambulation and stair climbing for improved functional mobility.   Follow Up Recommendations  SNF     Equipment Recommendations  Other (comment) (hemiwalker)    Recommendations for Other Services       Precautions / Restrictions Precautions Precautions: Posterior Hip;Fall Restrictions Weight Bearing Restrictions: Yes LLE Weight Bearing: Weight bearing as tolerated    Mobility  Bed Mobility Overal bed mobility: Needs Assistance Bed Mobility: Supine to Sit;Sit to Supine     Supine to sit: Min assist Sit to supine: Min assist   General bed mobility comments: Requires Min A for LLE lowering off edge of bed for sit and lift into bed for supine. Adjusts upward in bed with use of trapeze  Transfers Overall transfer level: Needs assistance Equipment used: Hemi-walker Transfers: Sit to/from Stand Sit to Stand: Supervision         General transfer comment: Improved ability and speed for STS transfer. Follows hip precautions.Improved ability to share weight through  LLE once in motion to stand  Ambulation/Gait Ambulation/Gait assistance: Min guard Ambulation Distance (Feet): 100 Feet Assistive device: Hemi-walker Gait Pattern/deviations: Step-through pattern;Decreased step length - right;Decreased weight shift to left Gait velocity: reduced Gait velocity interpretation: Below normal speed for age/gender General Gait Details: Improved overall quality with more reciprocal pattern, improving speed and fluidity. Continues to require heavy lean on R hemi walker. 1 episode of tripping on R hemiwalker while turning requiring Min A to correct.    Stairs            Wheelchair Mobility    Modified Rankin (Stroke Patients Only)       Balance Overall balance assessment: Needs assistance Sitting-balance support: Single extremity supported;Feet supported Sitting balance-Leahy Scale: Good     Standing balance support: Single extremity supported Standing balance-Leahy Scale: Fair                      Cognition Arousal/Alertness: Awake/alert Behavior During Therapy: WFL for tasks assessed/performed Overall Cognitive Status: Within Functional Limits for tasks assessed       Memory:  (Remembers posterior hip precautions and adheres well)              Exercises Total Joint Exercises Quad Sets: Strengthening;Left;10 reps;Standing (10 reps stand) Gluteal Sets: Strengthening;Left;10 reps;Standing (10 reps stand) Hip ABduction/ADduction: AROM;Strengthening;Left;10 reps;Standing Straight Leg Raises: AROM;Strengthening;Left;10 reps;Standing Long Arc Quad: Strengthening;AROM;Left;10 reps;Seated (2 sets) Marching in Standing: AROM;Strengthening;Left;10 reps;Standing Standing Hip Extension: AROM;Left;10 reps;Standing    General Comments        Pertinent Vitals/Pain Pain Assessment: 0-10 Pain Score: 4  Pain Location: L hip Pain Descriptors / Indicators: Aching;Constant Pain Intervention(s): Monitored during  session;Patient  requesting pain meds-RN notified    Home Living                      Prior Function            PT Goals (current goals can now be found in the care plan section) Progress towards PT goals: Progressing toward goals    Frequency  BID    PT Plan Current plan remains appropriate    Co-evaluation             End of Session Equipment Utilized During Treatment: Gait belt Activity Tolerance: Patient tolerated treatment well Patient left: in bed;with call bell/phone within reach (refused alarm; will call for assist)     Time: [id number] PT Time Calculation (min) (ACUTE ONLY): 32 min  Charges:  $Gait Training: 8-22 mins $Therapeutic Exercise: 8-22 mins                    G Codes:      Charlaine Dalton, PTA 09/20/2015, 10:34 AM

## 2015-09-20 NOTE — Progress Notes (Signed)
DISCHARGE NOTE:   Report called to [facility] @ [facility]. EMS called, pt ready and waiting on transportation.

## 2015-12-12 ENCOUNTER — Other Ambulatory Visit: Payer: Self-pay | Admitting: Orthopedic Surgery

## 2015-12-12 DIAGNOSIS — M5416 Radiculopathy, lumbar region: Secondary | ICD-10-CM

## 2016-01-05 ENCOUNTER — Ambulatory Visit
Admission: RE | Admit: 2016-01-05 | Discharge: 2016-01-05 | Disposition: A | Discharge status: Home / Self Care | Payer: Medicare Other | Source: Ambulatory Visit | Attending: Orthopedic Surgery | Admitting: Orthopedic Surgery

## 2016-01-05 DIAGNOSIS — M47896 Other spondylosis, lumbar region: Secondary | ICD-10-CM | POA: Diagnosis not present

## 2016-01-05 DIAGNOSIS — M438X6 Other specified deforming dorsopathies, lumbar region: Secondary | ICD-10-CM | POA: Insufficient documentation

## 2016-01-05 DIAGNOSIS — M5416 Radiculopathy, lumbar region: Secondary | ICD-10-CM

## 2016-01-05 DIAGNOSIS — M5386 Other specified dorsopathies, lumbar region: Secondary | ICD-10-CM | POA: Diagnosis not present

## 2016-02-20 ENCOUNTER — Telehealth: Payer: Self-pay | Admitting: *Deleted

## 2016-03-04 ENCOUNTER — Ambulatory Visit: Payer: Self-pay | Admitting: Pain Medicine

## 2016-04-03 ENCOUNTER — Encounter
Admission: RE | Admit: 2016-04-03 | Discharge: 2016-04-03 | Disposition: A | Discharge status: Home / Self Care | Payer: Medicare Other | Source: Ambulatory Visit | Attending: Neurosurgery | Admitting: Neurosurgery

## 2016-04-03 ENCOUNTER — Ambulatory Visit
Admission: RE | Admit: 2016-04-03 | Discharge: 2016-04-03 | Disposition: A | Discharge status: Home / Self Care | Payer: Medicare Other | Source: Ambulatory Visit | Attending: Neurosurgery | Admitting: Neurosurgery

## 2016-04-03 DIAGNOSIS — Z0183 Encounter for blood typing: Secondary | ICD-10-CM | POA: Diagnosis not present

## 2016-04-03 DIAGNOSIS — F17200 Nicotine dependence, unspecified, uncomplicated: Secondary | ICD-10-CM | POA: Diagnosis not present

## 2016-04-03 DIAGNOSIS — Z01818 Encounter for other preprocedural examination: Secondary | ICD-10-CM | POA: Insufficient documentation

## 2016-04-03 DIAGNOSIS — Z01812 Encounter for preprocedural laboratory examination: Secondary | ICD-10-CM | POA: Diagnosis not present

## 2016-04-03 DIAGNOSIS — I700 Atherosclerosis of aorta: Secondary | ICD-10-CM | POA: Diagnosis not present

## 2016-04-03 DIAGNOSIS — R9431 Abnormal electrocardiogram [ECG] [EKG]: Secondary | ICD-10-CM | POA: Insufficient documentation

## 2016-04-03 DIAGNOSIS — Z885 Allergy status to narcotic agent status: Secondary | ICD-10-CM | POA: Diagnosis not present

## 2016-04-03 DIAGNOSIS — Z888 Allergy status to other drugs, medicaments and biological substances status: Secondary | ICD-10-CM | POA: Insufficient documentation

## 2016-04-03 DIAGNOSIS — J449 Chronic obstructive pulmonary disease, unspecified: Secondary | ICD-10-CM | POA: Diagnosis not present

## 2016-04-03 DIAGNOSIS — M5416 Radiculopathy, lumbar region: Secondary | ICD-10-CM | POA: Insufficient documentation

## 2016-04-03 HISTORY — DX: Cardiac murmur, unspecified: R01.1

## 2016-04-03 LAB — BASIC METABOLIC PANEL
Anion gap: 7 (ref 5–15)
BUN: 15 mg/dL (ref 6–20)
CHLORIDE: 103 mmol/L (ref 101–111)
CO2: 25 mmol/L (ref 22–32)
CREATININE: 0.88 mg/dL (ref 0.44–1.00)
Calcium: 8.9 mg/dL (ref 8.9–10.3)
GFR calc Af Amer: >60 mL/min (ref >60)
GFR calc non Af Amer: >60 mL/min (ref >60)
Glucose, Bld: 76 mg/dL (ref 65–99)
POTASSIUM: 4 mmol/L (ref 3.5–5.1)
Sodium: 135 mmol/L (ref 135–145)

## 2016-04-03 LAB — URINALYSIS COMPLETE WITH MICROSCOPIC (ARMC ONLY)
BILIRUBIN URINE: NEGATIVE
GLUCOSE, UA: NEGATIVE mg/dL
Ketones, ur: NEGATIVE mg/dL
Leukocytes, UA: NEGATIVE
NITRITE: NEGATIVE
Protein, ur: NEGATIVE mg/dL
SPECIFIC GRAVITY, URINE: 1.008 (ref 1.005–1.030)
pH: 5 (ref 5.0–8.0)

## 2016-04-03 LAB — DIFFERENTIAL
Basophils Absolute: 0.1 10*3/uL (ref 0–0.1)
Basophils Relative: 1 %
EOS PCT: 1 %
Eosinophils Absolute: 0.1 10*3/uL (ref 0–0.7)
LYMPHS ABS: 2.4 10*3/uL (ref 1.0–3.6)
LYMPHS PCT: 27 %
MONO ABS: 0.7 10*3/uL (ref 0.2–0.9)
MONOS PCT: 8 %
NEUTROS ABS: 5.6 10*3/uL (ref 1.4–6.5)
Neutrophils Relative %: 63 %

## 2016-04-03 LAB — APTT: aPTT: 30 seconds (ref 24–36)

## 2016-04-03 LAB — SURGICAL PCR SCREEN
MRSA, PCR: NEGATIVE
Staphylococcus aureus: NEGATIVE

## 2016-04-03 LAB — TYPE AND SCREEN
ABO/RH(D): A NEG
ANTIBODY SCREEN: NEGATIVE

## 2016-04-03 LAB — CBC
HEMATOCRIT: 42.1 % (ref 35.0–47.0)
HEMOGLOBIN: 14.6 g/dL (ref 12.0–16.0)
MCH: 30.8 pg (ref 26.0–34.0)
MCHC: 34.5 g/dL (ref 32.0–36.0)
MCV: 89.2 fL (ref 80.0–100.0)
PLATELETS: 251 10*3/uL (ref 150–440)
RBC: 4.72 MIL/uL (ref 3.80–5.20)
RDW: 14.9 % — ABNORMAL HIGH (ref 11.5–14.5)
WBC: 9 10*3/uL (ref 3.6–11.0)

## 2016-04-03 LAB — PROTIME-INR
INR: 0.94
PROTHROMBIN TIME: 12.6 s (ref 11.4–15.2)

## 2016-04-03 NOTE — Pre-Procedure Instructions (Signed)
Pre-op EKG called to Dr. Kayleen Memos and requested PCP to review.

## 2016-04-03 NOTE — Patient Instructions (Signed)
  Your procedure is scheduled on: April 16, 2016 (Wednesday) Report to Same Day Surgery 2nd floor [facility] To find out your arrival time please call [phone] between 1PM - 3PM on April 15, 2016 (Tuesday)  Remember: Instructions that are not followed completely may result in serious medical risk, up to and including death, or upon the discretion of your surgeon and anesthesiologist your surgery may need to be rescheduled.    _x___ 1. Do not eat food or drink liquids after midnight. No gum chewing or hard candies.     _x_   __ 2. No Alcohol for 24 hours before or after surgery.   __x__3. No Smoking for 24 prior to surgery.   ____  4. Bring all medications with you on the day of surgery if instructed.    __x__ 5. Notify your doctor if there is any change in your medical condition     (cold, fever, infections).     Do not wear jewelry, make-up, hairpins, clips or nail polish.  Do not wear lotions, powders, or perfumes. You may wear deodorant.  Do not shave 48 hours prior to surgery. Men may shave face and neck.  Do not bring valuables to the hospital.    [facility] is not responsible for any belongings or valuables.               Contacts, dentures or bridgework may not be worn into surgery.  Leave your suitcase in the car. After surgery it may be brought to your room.  For patients admitted to the hospital, discharge time is determined by your treatment team.   Patients discharged the day of surgery will not be allowed to drive home.    Please read over the following fact sheets that you were given:   [facility] Preparing for Surgery and or MRSA Information   _x___ Take these medicines the morning of surgery with A SIP OF WATER:    1. Gabapentin  2. Inderal  3.Effexor  4.  5.  6.  ____Fleets enema or Magnesium Citrate as directed.   _x___ Use CHG Soap or sage wipes as directed on instruction sheet   ____ Use inhalers on the day of surgery and bring to  hospital day of surgery  ____ Stop metformin 2 days prior to surgery    ____ Take 1/2 of usual insulin dose the night before surgery and none on the morning of           surgery.   _x___ Stop aspirin or coumadin, or plavix (NO ASPIRIN)  x__ Stop Anti-inflammatories such as Advil, Aleve, Ibuprofen, Motrin, Naproxen,          Naprosyn, Goodies powders or aspirin products. Ok to take Tylenol.   _x___ Stop supplements until after surgery.  (STOP  B1  NOW)  ____ Bring C-Pap to the hospital.

## 2016-04-04 NOTE — Pre-Procedure Instructions (Signed)
Called patient regarding need for PCP.  "I have no PCP send request to Dr Izora Ribas."  Domingo Cocking and notified Dr Rhea Bleacher office concerning need for her EKG to be evaluated.  EKGs faxed to Dr Rhea Bleacher office.  "I will set up an appointment with cardiologist. " per Berdine Addison.

## 2016-04-10 NOTE — Pre-Procedure Instructions (Signed)
Urine results sent to Dr. Izora Ribas for review.

## 2016-04-14 NOTE — Pre-Procedure Instructions (Addendum)
HAVING STRESS TEST 04/14/16 CLEARED LOW RISK BY DR CALLWOOD 04/14/16

## 2016-04-16 ENCOUNTER — Encounter
Admission: RE | Disposition: A | Discharge status: Home / Self Care | Payer: Self-pay | Source: Ambulatory Visit | Attending: Neurosurgery

## 2016-04-16 ENCOUNTER — Encounter: Payer: Self-pay | Admitting: *Deleted

## 2016-04-16 ENCOUNTER — Ambulatory Visit
Admission: RE | Admit: 2016-04-16 | Discharge: 2016-04-16 | Disposition: A | Discharge status: Home / Self Care | Payer: Medicare Other | Source: Ambulatory Visit | Attending: Neurosurgery | Admitting: Neurosurgery

## 2016-04-16 ENCOUNTER — Ambulatory Visit: Payer: Medicare Other | Admitting: Anesthesiology

## 2016-04-16 ENCOUNTER — Ambulatory Visit: Payer: Medicare Other

## 2016-04-16 DIAGNOSIS — F419 Anxiety disorder, unspecified: Secondary | ICD-10-CM | POA: Insufficient documentation

## 2016-04-16 DIAGNOSIS — F4310 Post-traumatic stress disorder, unspecified: Secondary | ICD-10-CM | POA: Insufficient documentation

## 2016-04-16 DIAGNOSIS — F329 Major depressive disorder, single episode, unspecified: Secondary | ICD-10-CM | POA: Insufficient documentation

## 2016-04-16 DIAGNOSIS — R011 Cardiac murmur, unspecified: Secondary | ICD-10-CM | POA: Diagnosis not present

## 2016-04-16 DIAGNOSIS — Z885 Allergy status to narcotic agent status: Secondary | ICD-10-CM | POA: Insufficient documentation

## 2016-04-16 DIAGNOSIS — J45909 Unspecified asthma, uncomplicated: Secondary | ICD-10-CM | POA: Insufficient documentation

## 2016-04-16 DIAGNOSIS — M5126 Other intervertebral disc displacement, lumbar region: Secondary | ICD-10-CM | POA: Insufficient documentation

## 2016-04-16 DIAGNOSIS — Z888 Allergy status to other drugs, medicaments and biological substances status: Secondary | ICD-10-CM | POA: Diagnosis not present

## 2016-04-16 DIAGNOSIS — F17210 Nicotine dependence, cigarettes, uncomplicated: Secondary | ICD-10-CM | POA: Insufficient documentation

## 2016-04-16 DIAGNOSIS — K219 Gastro-esophageal reflux disease without esophagitis: Secondary | ICD-10-CM | POA: Diagnosis not present

## 2016-04-16 DIAGNOSIS — Z419 Encounter for procedure for purposes other than remedying health state, unspecified: Secondary | ICD-10-CM

## 2016-04-16 HISTORY — PX: LUMBAR LAMINECTOMY/DECOMPRESSION MICRODISCECTOMY: SHX5026

## 2016-04-16 SURGERY — LUMBAR LAMINECTOMY/DECOMPRESSION MICRODISCECTOMY 1 LEVEL
Anesthesia: General | Site: Back | Laterality: Left | Wound class: Clean

## 2016-04-16 MED ORDER — METHYLPREDNISOLONE ACETATE 40 MG/ML IJ SUSP
INTRAMUSCULAR | Status: DC | PRN
  Administered 2016-04-16: 40 mg

## 2016-04-16 MED ORDER — GELATIN ABSORBABLE 12-7 MM EX MISC
CUTANEOUS | Status: DC | PRN
  Administered 2016-04-16: 1

## 2016-04-16 MED ORDER — SCOPOLAMINE 1 MG/3DAYS TD PT72
1.0000 | MEDICATED_PATCH | Freq: Once | TRANSDERMAL | Status: DC
  Administered 2016-04-16: 1.5 mg via TRANSDERMAL

## 2016-04-16 MED ORDER — TIZANIDINE HCL 4 MG PO TABS: 4.0000 mg | ORAL_TABLET | Freq: Four times a day (QID) | ORAL | 0 refills | Status: DC | PRN

## 2016-04-16 MED ORDER — CEFAZOLIN IN D5W 1 GM/50ML IV SOLN
INTRAVENOUS | Status: DC | PRN
  Administered 2016-04-16: 2 g via INTRAVENOUS

## 2016-04-16 MED ORDER — PENTAFLUOROPROP-TETRAFLUOROETH EX AERO
INHALATION_SPRAY | CUTANEOUS | Status: AC
  Administered 2016-04-16: 30
  Filled 2016-04-16: qty 30

## 2016-04-16 MED ORDER — HYDROCODONE-ACETAMINOPHEN 5-325 MG PO TABS: 1.0000 | ORAL_TABLET | Freq: Four times a day (QID) | ORAL | 0 refills | Status: DC | PRN

## 2016-04-16 MED ORDER — SCOPOLAMINE 1 MG/3DAYS TD PT72
MEDICATED_PATCH | TRANSDERMAL | Status: AC
  Administered 2016-04-16: 1.5 mg via TRANSDERMAL
  Filled 2016-04-16: qty 1

## 2016-04-16 MED ORDER — LIDOCAINE HCL (CARDIAC) 20 MG/ML IV SOLN
INTRAVENOUS | Status: DC | PRN
  Administered 2016-04-16: 50 mg via INTRAVENOUS

## 2016-04-16 MED ORDER — PROMETHAZINE HCL 25 MG/ML IJ SOLN
6.2500 mg | INTRAMUSCULAR | Status: DC | PRN
  Administered 2016-04-16: 12.5 mg via INTRAVENOUS

## 2016-04-16 MED ORDER — SODIUM CHLORIDE 0.9 % IJ SOLN
INTRAMUSCULAR | Status: AC
  Filled 2016-04-16: qty 10

## 2016-04-16 MED ORDER — SUGAMMADEX SODIUM 200 MG/2ML IV SOLN
INTRAVENOUS | Status: DC | PRN
  Administered 2016-04-16: 150 mg via INTRAVENOUS

## 2016-04-16 MED ORDER — THROMBIN (RECOMBINANT) 5000 UNITS EX SOLR
CUTANEOUS | Status: DC | PRN
  Administered 2016-04-16: 5000 [IU]

## 2016-04-16 MED ORDER — PROMETHAZINE HCL 25 MG/ML IJ SOLN
INTRAMUSCULAR | Status: AC
  Filled 2016-04-16: qty 1

## 2016-04-16 MED ORDER — LACTATED RINGERS IV SOLN
INTRAVENOUS | Status: DC
  Administered 2016-04-16: 12:00:00 via INTRAVENOUS

## 2016-04-16 MED ORDER — BUPIVACAINE LIPOSOME 1.3 % IJ SUSP
INTRAMUSCULAR | Status: DC | PRN
  Administered 2016-04-16: 20 mL

## 2016-04-16 MED ORDER — FENTANYL CITRATE (PF) 100 MCG/2ML IJ SOLN: 25.0000 ug | INTRAMUSCULAR | Status: DC | PRN

## 2016-04-16 MED ORDER — DEXAMETHASONE SODIUM PHOSPHATE 10 MG/ML IJ SOLN
INTRAMUSCULAR | Status: DC | PRN
  Administered 2016-04-16: 10 mg via INTRAVENOUS

## 2016-04-16 MED ORDER — BACITRACIN 50000 UNITS IM SOLR
INTRAMUSCULAR | Status: DC | PRN
  Administered 2016-04-16: 50000 [IU]

## 2016-04-16 MED ORDER — FAMOTIDINE 20 MG PO TABS
20.0000 mg | ORAL_TABLET | Freq: Once | ORAL | Status: AC
  Administered 2016-04-16: 20 mg via ORAL

## 2016-04-16 MED ORDER — ONDANSETRON HCL 4 MG/2ML IJ SOLN
INTRAMUSCULAR | Status: DC | PRN
  Administered 2016-04-16: 4 mg via INTRAVENOUS

## 2016-04-16 MED ORDER — FENTANYL CITRATE (PF) 100 MCG/2ML IJ SOLN
INTRAMUSCULAR | Status: DC | PRN
  Administered 2016-04-16 (×4): 50 ug via INTRAVENOUS

## 2016-04-16 MED ORDER — PROPOFOL 10 MG/ML IV BOLUS
INTRAVENOUS | Status: DC | PRN
  Administered 2016-04-16: 40 mg via INTRAVENOUS
  Administered 2016-04-16: 160 mg via INTRAVENOUS

## 2016-04-16 MED ORDER — ROCURONIUM BROMIDE 100 MG/10ML IV SOLN
INTRAVENOUS | Status: DC | PRN
  Administered 2016-04-16: 20 mg via INTRAVENOUS
  Administered 2016-04-16: 40 mg via INTRAVENOUS
  Administered 2016-04-16 (×2): 10 mg via INTRAVENOUS

## 2016-04-16 MED ORDER — MIDAZOLAM HCL 2 MG/2ML IJ SOLN
INTRAMUSCULAR | Status: DC | PRN
  Administered 2016-04-16: 2 mg via INTRAVENOUS

## 2016-04-16 SURGICAL SUPPLY — 59 items
BAND RUBBER 3X1/6 TAN STRL (MISCELLANEOUS) ×3 IMPLANT
BLADE BOVIE TIP EXT 4 (BLADE) ×3 IMPLANT
BUR NEURO DRILL SOFT 3.0X3.8M (BURR) ×3 IMPLANT
CANISTER SUCT 1200ML W/VALVE (MISCELLANEOUS) ×3 IMPLANT
CHLORAPREP W/TINT 26ML (MISCELLANEOUS) ×3 IMPLANT
CLOSURE WOUND 1/2 X4 (GAUZE/BANDAGES/DRESSINGS)
CNTNR SPEC 2.5X3XGRAD LEK (MISCELLANEOUS) ×1
CONT SPEC 4OZ STER OR WHT (MISCELLANEOUS) ×2
CONTAINER SPEC 2.5X3XGRAD LEK (MISCELLANEOUS) ×1 IMPLANT
COUNTER NEEDLE 20/40 LG (NEEDLE) ×3 IMPLANT
COVER LIGHT HANDLE STERIS (MISCELLANEOUS) ×6 IMPLANT
CUP MEDICINE 2OZ PLAST GRAD ST (MISCELLANEOUS) ×6 IMPLANT
DERMABOND ADVANCED (GAUZE/BANDAGES/DRESSINGS) ×2
DERMABOND ADVANCED .7 DNX12 (GAUZE/BANDAGES/DRESSINGS) ×1 IMPLANT
DRAPE C-ARM XRAY 36X54 (DRAPES) ×6 IMPLANT
DRAPE C-ARMOR (DRAPES) ×3 IMPLANT
DRAPE INCISE IOBAN 66X45 STRL (DRAPES) IMPLANT
DRAPE LAPAROTOMY 100X77 ABD (DRAPES) ×3 IMPLANT
DRAPE MICROSCOPE LEICA (MISCELLANEOUS) ×3 IMPLANT
DRAPE POUCH INSTRU U-SHP 10X18 (DRAPES) ×3 IMPLANT
DRAPE SURG 17X11 SM STRL (DRAPES) ×12 IMPLANT
ELECT CAUTERY BLADE TIP 2.5 (TIP) ×3
ELECT EZSTD 165MM 6.5IN (MISCELLANEOUS)
ELECTRODE CAUTERY BLDE TIP 2.5 (TIP) ×1 IMPLANT
ELECTRODE EZSTD 165MM 6.5IN (MISCELLANEOUS) IMPLANT
GLOVE BIOGEL PI IND STRL 8.5 (GLOVE) ×1 IMPLANT
GLOVE BIOGEL PI INDICATOR 8.5 (GLOVE) ×2
GLOVE SURG SYN 8.5  E (GLOVE) ×4
GLOVE SURG SYN 8.5 E (GLOVE) ×2 IMPLANT
GOWN STRL REUS W/ TWL LRG LVL3 (GOWN DISPOSABLE) ×1 IMPLANT
GOWN STRL REUS W/ TWL XL LVL3 (GOWN DISPOSABLE) ×1 IMPLANT
GOWN STRL REUS W/TWL LRG LVL3 (GOWN DISPOSABLE) ×2
GOWN STRL REUS W/TWL XL LVL3 (GOWN DISPOSABLE) ×2
GRADUATE 1200CC STRL 31836 (MISCELLANEOUS) ×3 IMPLANT
KIT SPINAL PRONEVIEW (KITS) ×3 IMPLANT
KNIFE BAYONET SHORT DISCETOMY (MISCELLANEOUS) ×2 IMPLANT
MARKER SKIN DUAL TIP RULER LAB (MISCELLANEOUS) ×6 IMPLANT
NDL SAFETY ECLIPSE 18X1.5 (NEEDLE) ×1 IMPLANT
NEEDLE HYPO 18GX1.5 SHARP (NEEDLE) ×2
NS IRRIG 1000ML POUR BTL (IV SOLUTION) ×3 IMPLANT
PACK LAMINECTOMY NEURO (CUSTOM PROCEDURE TRAY) ×3 IMPLANT
PAD ARMBOARD 7.5X6 YLW CONV (MISCELLANEOUS) ×3 IMPLANT
PATTIES SURGICAL .5X1.5 (GAUZE/BANDAGES/DRESSINGS) IMPLANT
SPOGE SURGIFLO 8M (HEMOSTASIS) ×2
SPONGE SURGIFLO 8M (HEMOSTASIS) ×1 IMPLANT
STRIP CLOSURE SKIN 1/2X4 (GAUZE/BANDAGES/DRESSINGS) IMPLANT
SUT NURALON 4 0 TR CR/8 (SUTURE) IMPLANT
SUT VIC AB 2-0 CT1 18 (SUTURE) ×3 IMPLANT
SUT VIC AB 3-0 SH 27 (SUTURE) ×2
SUT VIC AB 3-0 SH 27X BRD (SUTURE) ×1 IMPLANT
SUT VICRYL 0 UR6 27IN ABS (SUTURE) ×3 IMPLANT
SWABSTK COMLB BENZOIN TINCTURE (MISCELLANEOUS) IMPLANT
SYR 20CC LL (SYRINGE) ×3 IMPLANT
SYRINGE 10CC LL (SYRINGE) ×3 IMPLANT
TOWEL OR 17X26 4PK STRL BLUE (TOWEL DISPOSABLE) ×6 IMPLANT
TUBE MATRX SPINL 18MM 6CM DISP (INSTRUMENTS) ×1
TUBE METRX SPINAL 18X6 DISP (INSTRUMENTS) ×1 IMPLANT
TUBING CONNECTING 10 (TUBING) ×2 IMPLANT
TUBING CONNECTING 10' (TUBING) ×1

## 2016-04-16 NOTE — H&P (Signed)
I have reviewed and confirmed my history and physical from 04/03/16 with no additions or changes. Plan for left L4-5 laminoforaminotomy and left L4-5 far lateral discectomy.  Risks and benefits reviewed.

## 2016-04-16 NOTE — Transfer of Care (Signed)
Immediate Anesthesia Transfer of Care Note  Patient: [name]  Procedure(s) Performed: Procedure(s) with comments: LUMBAR LAMINECTOMY/DECOMPRESSION MICRODISCECTOMY 1 LEVEL (Left) - Left L4-5 far lateral discectomy, left L4-5 laminoforaminotomy  Patient Location: PACU  Anesthesia Type:General  Level of Consciousness: sedated  Airway & Oxygen Therapy: Patient connected to face mask oxygen  Post-op Assessment: Post -op Vital signs reviewed and stable  Post vital signs: stable  Last Vitals:  Vitals:   04/16/16 1120 04/16/16 1551  BP: (!) 147/66 (!) 154/72  Pulse: 74 93  Resp: 18 18  Temp: 37.1 C 36.4 C    Last Pain:  Vitals:   04/16/16 1551  TempSrc: Temporal  PainSc:          Complications: No apparent anesthesia complications

## 2016-04-16 NOTE — Anesthesia Postprocedure Evaluation (Signed)
Anesthesia Post Note  Patient: [name]  Procedure(s) Performed: Procedure(s) (LRB): LUMBAR LAMINECTOMY/DECOMPRESSION MICRODISCECTOMY 1 LEVEL (Left)  Patient location during evaluation: PACU Anesthesia Type: General Level of consciousness: awake and alert Pain management: pain level controlled Vital Signs Assessment: post-procedure vital signs reviewed and stable Respiratory status: spontaneous breathing, nonlabored ventilation and respiratory function stable Cardiovascular status: blood pressure returned to baseline and stable Postop Assessment: no signs of nausea or vomiting Anesthetic complications: no    Last Vitals:  Vitals:   04/16/16 1621 04/16/16 1636  BP: 134/63 126/67  Pulse: 77 70  Resp: 13 12  Temp:  36.3 C    Last Pain:  Vitals:   04/16/16 1636  TempSrc:   PainSc: 0-No pain                 Jatavian Calica

## 2016-04-16 NOTE — Discharge Instructions (Addendum)
Instructions:       Your surgeon has performed an operation on your lumbar spine (low back) to relieve pressure on one or more nerves. Many times, patients feel better immediately after surgery and can overdo it. Even if you feel well, it is important that you follow these activity guidelines. If you do not let your back heal properly from the surgery, you can increase the chance of a disc herniation and return of your symptoms. The following are instructions to help in your recovery once you have been discharged from the hospital.  * Do not take anti-inflammatory medications for 5 days after surgery (naproxen [Aleve], ibuprofen [Advil, Motrin], celecoxib [Celebrex], etc.)  Activity    No bending, lifting, or twisting (BLT). Avoid lifting objects heavier than 10 pounds (gallon milk jug).  Where possible, avoid household activities that involve lifting, bending, pushing, or pulling such as laundry, vacuuming, grocery shopping, and childcare. Try to arrange for help from friends and family for these activities while your back heals.  Increase physical activity slowly as tolerated.  Taking short walks is encouraged, but avoid strenuous exercise. Do not jog, run, bicycle, lift weights, or participate in any other exercises unless specifically allowed by your doctor. Avoid prolonged sitting, including car rides.  Talk to your doctor before resuming sexual activity.  You should not drive until cleared by your doctor.  Until released by your doctor, you should not return to work or school.  You should rest at home and let your body heal.   You may shower three days after your surgery.  After showering, lightly dab your incision dry. Do not take a tub bath or go swimming until approved by your doctor at your follow-up appointment.  If you smoke, we strongly recommend that you quit.  Smoking has been proven to interfere with normal healing in your back and will dramatically reduce the success rate  of your surgery. Please contact [facility] (800-QUIT-NOW) and use the resources at www.[facility].com for assistance in stopping smoking.  Surgical Incision   If you have a dressing on your incision, you may remove it two days after your surgery. Keep your incision area clean and dry.  If you have staples or stitches on your incision, you should have a follow up scheduled for removal. If you do not have staples or stitches, you will have steri-strips (small pieces of surgical tape) or Dermabond glue. The steri-strips/glue should begin to peel away within about a week (it is fine if the steri-strips fall off before then). If the strips are still in place one week after your surgery, you may gently remove them.  Diet            You may return to your usual diet. Be sure to stay hydrated.  When to Contact us  Although your surgery and recovery will likely be uneventful, you may have some residual numbness, aches, and pains in your back and/or legs. This is normal and should improve in the next few weeks.  However, should you experience any of the following, contact us immediately:  New numbness or weakness  Pain that is progressively getting worse, and is not relieved by your pain medications or rest  Bleeding, redness, swelling, pain, or drainage from surgical incision  Chills or flu-like symptoms  Fever greater than 101.0 F (38.3 C)  Problems with bowel or bladder functions  Difficulty breathing or shortness of breath  Warmth, tenderness, or swelling in your calf  Contact Information  During  office hours (Monday-Friday 9 am to 5 pm), please call your physician at [phone]  After hours and weekends, please call the [place] Operator at [phone] and ask for the Neurosurgery Resident On Call   For a life-threatening emergency, call [place]   1) The drugs that you were given will stay in your system until tomorrow so for the next  24 hours you should not:  A) Drive an automobile B) Make any legal decisions C) Drink any alcoholic beverage   2) You may resume regular meals tomorrow.  Today it is better to start with liquids and gradually work up to solid foods.  You may eat anything you prefer, but it is better to start with liquids, then soup and crackers, and gradually work up to solid foods.   3) Please notify your doctor immediately if you have any unusual bleeding, trouble breathing, redness and pain at the surgery site, drainage, fever, or pain not relieved by medication.    4) Additional Instructions:        Please contact your physician with any problems or Same Day Surgery at [phone], Monday through Friday 6 am to 4 pm, or [place] at [facility] number at [phone].

## 2016-04-16 NOTE — Anesthesia Preprocedure Evaluation (Signed)
Anesthesia Evaluation  Patient identified by MRN, date of birth, ID band Patient awake    Reviewed: Allergy & Precautions, H&P , NPO status , Patient's Chart, lab work & pertinent test results, reviewed documented beta blocker date and time   History of Anesthesia Complications (+) PONV and history of anesthetic complications  Airway Mallampati: I  TM Distance: >3 FB Neck ROM: full    Dental  (+) Missing, Teeth Intact   Pulmonary neg shortness of breath, asthma , neg sleep apnea, neg COPD, neg recent URI, Current Smoker,           Cardiovascular Exercise Tolerance: Good (-) hypertension(-) angina(-) CAD, (-) Past MI, (-) Cardiac Stents and (-) CABG (-) dysrhythmias + Valvular Problems/Murmurs      Neuro/Psych PSYCHIATRIC DISORDERS (Anxiety, Depression, and PTSD) negative neurological ROS     GI/Hepatic Neg liver ROS, GERD  ,  Endo/Other  negative endocrine ROS  Renal/GU negative Renal ROS  negative genitourinary   Musculoskeletal   Abdominal   Peds  Hematology negative hematology ROS (+)   Anesthesia Other Findings Past Medical History: No date: Anxiety No date: Anxiety     Comment: panic anxiety disorder No date: Arthritis No date: Asthma     Comment: chronic asthmatic bronchitis No date: Cancer ([place])     Comment: Desmoid tumor left forearm No date: Depression No date: ETOH abuse No date: Heart murmur No date: Insomnia No date: Neuropathy ([employer])   Reproductive/Obstetrics negative OB ROS                             Anesthesia Physical Anesthesia Plan  ASA: II  Anesthesia Plan: General   Post-op Pain Management:    Induction:   Airway Management Planned:   Additional Equipment:   Intra-op Plan:   Post-operative Plan:   Informed Consent: I have reviewed the patients History and Physical, chart, labs and discussed the procedure including the risks, benefits and  alternatives for the proposed anesthesia with the patient or authorized representative who has indicated his/her understanding and acceptance.   Dental Advisory Given  Plan Discussed with: Anesthesiologist, CRNA and Surgeon  Anesthesia Plan Comments:         Anesthesia Quick Evaluation

## 2016-04-16 NOTE — Anesthesia Procedure Notes (Signed)
Procedure Name: Intubation Performed by: Rolla Plate Pre-anesthesia Checklist: Patient identified, Emergency Drugs available, Suction available and Patient being monitored Patient Re-evaluated:Patient Re-evaluated prior to inductionOxygen Delivery Method: Circle system utilized Preoxygenation: Pre-oxygenation with 100% oxygen Intubation Type: IV induction Ventilation: Mask ventilation without difficulty Laryngoscope Size: Miller and 2 Grade View: Grade I Tube type: Oral Tube size: 7.0 mm Number of attempts: 1 Airway Equipment and Method: Stylet Placement Confirmation: ETT inserted through vocal cords under direct vision,  positive ETCO2 and breath sounds checked- equal and bilateral Secured at: 21 cm Tube secured with: Tape Dental Injury: Teeth and Oropharynx as per pre-operative assessment

## 2016-04-16 NOTE — Op Note (Signed)
Indications: L4-5 disc herniation  [name] has L4 and L5 lumbar radiculopathies from a far lateral disc herniation at facet arthropathy at L4-5. After failing conservative management, she elected to proceed with surgery.   Findings: disc herniation at L4-5 extraforaminally  Preoperative Note:    Risks of surgery discussed include: infection, bleeding, stroke, coma, death, paralysis, CSF leak, nerve/spinal cord injury, numbness, tingling, weakness, complex regional pain syndrome, recurrent stenosis and/or disc herniation, vascular injury, development of instability, neck/back pain, need for further surgery, persistent symptoms, development of deformity, and the risks of anesthesia. They understood these risks and have agreed to proceed.  Operative Note:   The patient was then brought from the preoperative center with intravenous access established.  They underwent general anesthesia and endotracheal tube intubation.  They were then rotated on the [place] rail top where all pressure points were appropriately padded.  The skin was then thoroughly cleansed.  Perioperative antibiotic prophylaxis was administered.  Sterile prep and drapes were then applied and a timeout was then observed.  C-arm was brought into the field under sterile conditions and an incision marked.   Once this was complete a 2 cm incision was opened with the use of a #10 blade knife.  The metrx tubes were sequentially advanced under lateral fluoroscopy until a #18 tube by 60 mm depth was established the final tube was then locked in place to the bed side attachment.  The tubes were first set about the L4-5 facet for L4-5 foraminotomy.  The microscope was then sterilely brought into the field and muscle creep was hemostased with a bipolar and resected with a pituitary rongeur.  A Bovie extender was then used to expose the spinous process and lamina.  Careful attention was placed to not violate the facet capsule a 3 mm matchstick  drill bit was then used to make a hemi-laminotomy trough until the ligamentum flavum was exposed.  This was extended to the base of the spinous process.  Once this was complete and the underlying ligamentum flavum was visualized this was dissected with a 6  angle curette and resected with a #2 #3 biting [place].  The laminotomy opening was also expanded in similar fashion and hemostasis was obtained with Surgifoam and a patty as well as bone wax.  The rostral aspect of the caudal level of the lamina was also resected with a #2 biting [place]ison effort to further enhance exposure.  Once the underlying dura was visualized a Penfield 4 was then used to dissect and expose the traversing nerve root.  Once this was identified a nerve root retractor was used to mobilize this medially.  The traversing L5 nerve root was completely decompressed. Hemostasis was achieved, then the tubes were removed.   The metrx tubes were then moved to the L5 transverse process and confirmed with flouroscopy. The Bovie was used to identify the transverse process. The tubes were then sequentially moved until the lateral L4-5 facet was identified. The intertransverse membrane was identified and divided. The soft tissues were then carefully dissected until the lateral and superior margin of the left L5 pedicle were palpated. The exiting L4 nerve root and the left L4 pedicle were identified and palpated. The disc herniation was noted, coagulated, and opened. Using microsurgical instruments, a far lateral discectomy was performed until there was no compression of the exiting nerve root. A flouroscopy shot was taken to confirm the correct full decompression.  After decompression, a Depo-Medrol soaked Gelfoam pledget was placed along the nerve root.  Hemostasis was achieved. The tube system was then removed under microscopic visualization and hemostasis was obtained with a bipolar.  The wound was copiously irrigated. The fascial layer was  reapproximated with the use of a 0- Vicryl suture.  Subcutaneous tissue layer was reapproximated using 2-0 Vicryl suture.  The skin was then cleansed and Dermabond was used to close the skin opening.  Patient was then rotated back to the preoperative bed awakened from anesthesia and taken to recovery all counts are correct in this case.  I performed the entire case. Shelly Maw MD

## 2016-04-17 ENCOUNTER — Encounter: Payer: Self-pay | Admitting: Neurosurgery

## 2016-04-17 MED FILL — Thrombin For Soln 5000 Unit: CUTANEOUS | Qty: 5000 | Status: AC

## 2016-05-30 ENCOUNTER — Encounter: Payer: Self-pay | Admitting: Emergency Medicine

## 2016-05-30 ENCOUNTER — Emergency Department
Admission: EM | Admit: 2016-05-30 | Discharge: 2016-05-31 | Disposition: A | Discharge status: Home / Self Care | Payer: Medicare Other | Attending: Emergency Medicine | Admitting: Emergency Medicine

## 2016-05-30 DIAGNOSIS — F1010 Alcohol abuse, uncomplicated: Secondary | ICD-10-CM | POA: Diagnosis present

## 2016-05-30 DIAGNOSIS — F10930 Alcohol use, unspecified with withdrawal, uncomplicated: Secondary | ICD-10-CM

## 2016-05-30 DIAGNOSIS — Z79899 Other long term (current) drug therapy: Secondary | ICD-10-CM | POA: Insufficient documentation

## 2016-05-30 DIAGNOSIS — F10239 Alcohol dependence with withdrawal, unspecified: Secondary | ICD-10-CM | POA: Insufficient documentation

## 2016-05-30 DIAGNOSIS — J45909 Unspecified asthma, uncomplicated: Secondary | ICD-10-CM | POA: Diagnosis not present

## 2016-05-30 DIAGNOSIS — F17210 Nicotine dependence, cigarettes, uncomplicated: Secondary | ICD-10-CM | POA: Diagnosis not present

## 2016-05-30 DIAGNOSIS — F10230 Alcohol dependence with withdrawal, uncomplicated: Secondary | ICD-10-CM

## 2016-05-30 LAB — COMPREHENSIVE METABOLIC PANEL
ALBUMIN: 4.4 g/dL (ref 3.5–5.0)
ALT: 30 U/L (ref 14–54)
ANION GAP: 20 — AB (ref 5–15)
AST: 33 U/L (ref 15–41)
Alkaline Phosphatase: 91 U/L (ref 38–126)
BUN: 11 mg/dL (ref 6–20)
CHLORIDE: 96 mmol/L — AB (ref 101–111)
CO2: 16 mmol/L — ABNORMAL LOW (ref 22–32)
Calcium: 9.5 mg/dL (ref 8.9–10.3)
Creatinine, Ser: 0.98 mg/dL (ref 0.44–1.00)
GFR calc Af Amer: >60 mL/min (ref >60)
GFR calc non Af Amer: >60 mL/min (ref >60)
GLUCOSE: 76 mg/dL (ref 65–99)
POTASSIUM: 4.1 mmol/L (ref 3.5–5.1)
SODIUM: 132 mmol/L — AB (ref 135–145)
Total Bilirubin: 1.6 mg/dL — ABNORMAL HIGH (ref 0.3–1.2)
Total Protein: 8.2 g/dL — ABNORMAL HIGH (ref 6.5–8.1)

## 2016-05-30 LAB — CBC
HEMATOCRIT: 47.1 % — AB (ref 35.0–47.0)
HEMOGLOBIN: 16.2 g/dL — AB (ref 12.0–16.0)
MCH: 31.5 pg (ref 26.0–34.0)
MCHC: 34.3 g/dL (ref 32.0–36.0)
MCV: 91.8 fL (ref 80.0–100.0)
Platelets: 311 10*3/uL (ref 150–440)
RBC: 5.13 MIL/uL (ref 3.80–5.20)
RDW: 15.6 % — ABNORMAL HIGH (ref 11.5–14.5)
WBC: 12.3 10*3/uL — ABNORMAL HIGH (ref 3.6–11.0)

## 2016-05-30 LAB — ETHANOL: Alcohol, Ethyl (B): <5 mg/dL (ref <5)

## 2016-05-30 MED ORDER — ONDANSETRON 4 MG PO TBDP: 4.0000 mg | ORAL_TABLET | Freq: Three times a day (TID) | ORAL | 0 refills | Status: DC | PRN

## 2016-05-30 MED ORDER — LORAZEPAM 2 MG PO TABS
0.0000 mg | ORAL_TABLET | Freq: Four times a day (QID) | ORAL | Status: DC
Start: 2016-05-30 — End: 2016-05-31
  Administered 2016-05-30: 2 mg via ORAL
  Filled 2016-05-30: qty 1

## 2016-05-30 MED ORDER — THIAMINE HCL 100 MG/ML IJ SOLN: 100.0000 mg | Freq: Every day | INTRAMUSCULAR | Status: DC

## 2016-05-30 MED ORDER — VITAMIN B-1 100 MG PO TABS
100.0000 mg | ORAL_TABLET | Freq: Every day | ORAL | Status: DC
  Administered 2016-05-30: 100 mg via ORAL
  Filled 2016-05-30: qty 1

## 2016-05-30 MED ORDER — CHLORDIAZEPOXIDE HCL 25 MG PO CAPS
50.0000 mg | ORAL_CAPSULE | Freq: Once | ORAL | Status: AC
  Administered 2016-05-30: 50 mg via ORAL
  Filled 2016-05-30: qty 2

## 2016-05-30 MED ORDER — ONDANSETRON 4 MG PO TBDP
4.0000 mg | ORAL_TABLET | Freq: Once | ORAL | Status: AC
  Administered 2016-05-30: 4 mg via ORAL
  Filled 2016-05-30: qty 1

## 2016-05-30 MED ORDER — SODIUM CHLORIDE 0.9 % IV BOLUS (SEPSIS): 1000.0000 mL | Freq: Once | INTRAVENOUS | Status: DC

## 2016-05-30 MED ORDER — SODIUM CHLORIDE 0.9 % IV BOLUS (SEPSIS)
1000.0000 mL | Freq: Once | INTRAVENOUS | Status: AC
  Administered 2016-05-30: 1000 mL via INTRAVENOUS

## 2016-05-30 MED ORDER — ONDANSETRON HCL 4 MG PO TABS
4.0000 mg | ORAL_TABLET | Freq: Once | ORAL | Status: AC
  Administered 2016-05-30: 4 mg via ORAL
  Filled 2016-05-30: qty 1

## 2016-05-30 MED ORDER — CHLORDIAZEPOXIDE HCL 10 MG PO CAPS: ORAL_CAPSULE | ORAL | 0 refills | Status: DC

## 2016-05-30 MED ORDER — LORAZEPAM 2 MG PO TABS: 0.0000 mg | ORAL_TABLET | Freq: Two times a day (BID) | ORAL | Status: DC

## 2016-05-30 NOTE — Discharge Instructions (Signed)
Please follow-up with your doctor or call your insurance company for further detox recommendations. Please use her Librium as prescribed. As we discussed is important that you do not drink alcohol while taking this medication. Return to the emergency department for any personally concerning symptoms.

## 2016-05-30 NOTE — ED Provider Notes (Signed)
Patient now refusing to leave, we will place her on CIWA precautions and discussed with psychiatry. She now voices suicidal ideation.   Earleen Newport, MD 05/30/16 1726

## 2016-05-30 NOTE — ED Notes (Signed)
Patient reports unsuccessful attempt to contact a ride. States "no one wants to come out in the weather." States she will continue to try.

## 2016-05-30 NOTE — ED Notes (Signed)
Patient reporting that the ride she had lined up is not coming due to weather. When I explained to the patient that she was medically cleared and we had no reason to admit her so she would have to wait in the lobby until she could get a ride, patient stated "well then I'll become suicidal. If you send me to the lobby I'm going to hurt myself." MD made aware and currently at bedside.

## 2016-05-30 NOTE — ED Notes (Signed)
Patient reports a 12-pack a day drinking history. Reports her last drink was last night at 5pm. Patient complaining of nausea, tremors and anxiety. Denies any tactile, visual, or auditory hallucinations. Patient is A&O x4, calm and cooperative.

## 2016-05-30 NOTE — ED Notes (Signed)
Patient given dinner tray.

## 2016-05-30 NOTE — ED Notes (Addendum)
Patient reporting that her friend is coming to pick her up. Expressing concern that she will not have to time to get her prescription filled tonight. Spoke with Dr. Jimmye Norman regarding getting a dose of medicine here before she leaves.

## 2016-05-30 NOTE — ED Provider Notes (Signed)
[facility] Emergency Department Provider Note  Time seen: 11:38 AM  I have reviewed the triage vital signs and the nursing notes.   HISTORY  Chief Complaint Alcohol Intoxication    HPI [name] is a 60 y.o. female with a past medical history of anxiety, arthritis, depression, neuropathy, alcohol abuse who presents the emergency department hoping for alcohol detox. According to the patient she went through detox in January, has been sober since until 7 days ago when she relapsed. Denies any trigger for her re-labs. Has been drinking greater than 12 beers per day for the past 7 days. Last drink was 5 PM yesterday. Patient is hoping to detox off alcohol, but states she does not believe she has we will power to do it at home on her own. Patient has a history of competent which on the past which she states she has had a seizure before. Denies any seizure activity. States nausea but denies vomiting. Denies any medical complaints. Denies drug use.  Past Medical History:  Diagnosis Date  . Anxiety   . Anxiety    panic anxiety disorder  . Arthritis   . Asthma    chronic asthmatic bronchitis  . 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 Depression   . ETOH abuse   . Heart murmur   . Insomnia   . Neuropathy [facility])     Patient Active Problem List   Diagnosis Date Noted  . S/P total hip arthroplasty 09/17/2015  . Severe recurrent major depression without psychotic features ([place]) 07/03/2015  . Alcohol use disorder, severe, dependence ([place]) 01/26/2015  . Tobacco use disorder 01/26/2015  . Alcohol withdrawal ([place]) 01/08/2015  . 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 GAD (generalized anxiety disorder) 12/24/2013  . PTSD (post-traumatic stress disorder) 12/22/2013    Past Surgical History:  Procedure Laterality Date  . ABDOMINAL HYSTERECTOMY    . ABDOMINAL HYSTERECTOMY  2007  . ARM AMPUTATION Left 1998   Desmoid tumor in left  forearm  . ARM AMPUTATION AT SHOULDER Left   . CESAREAN SECTION    . [place]   x 2  . JOINT REPLACEMENT Right 2012   hip  . LUMBAR LAMINECTOMY/DECOMPRESSION MICRODISCECTOMY Left 04/16/2016   Procedure: LUMBAR LAMINECTOMY/DECOMPRESSION MICRODISCECTOMY 1 LEVEL;  Surgeon: Meade Maw, MD;  Location: [facility] ORS;  Service: Neurosurgery;  Laterality: Left;  Left L4-5 far lateral discectomy, left L4-5 laminoforaminotomy  . TOTAL HIP ARTHROPLASTY Left 09/17/2015   Procedure: TOTAL HIP ARTHROPLASTY;  Surgeon: Dereck Leep, MD;  Location: [facility] ORS;  Service: Orthopedics;  Laterality: Left;    Prior to Admission medications   Medication Sig Start Date End Date Taking? Authorizing Provider  celecoxib (CELEBREX) 200 MG capsule Take 200 mg by mouth daily. In the late afternoon.    Historical Provider, MD  fluticasone (FLONASE) 50 MCG/ACT nasal spray Place 2 sprays into both nostrils as needed. At 3 pm.     Historical Provider, MD  folic acid (FOLVITE) 1 MG tablet Take 1 mg by mouth at bedtime.    Historical Provider, MD  gabapentin (NEURONTIN) 300 MG capsule Take 3 capsules (900 mg total) by mouth 3 (three) times daily with meals. 07/08/15   Hildred Priest, MD  gabapentin (NEURONTIN) 300 MG capsule Take 300 mg by mouth 3 (three) times daily.    Historical Provider, MD  HYDROcodone-acetaminophen (NORCO) 5-325 MG tablet Take 1 tablet by mouth every  6 (six) hours as needed for moderate pain. 04/16/16   Meade Maw, MD  propranolol (INDERAL) 10 MG tablet Take 1 tablet (10 mg total) by mouth 3 (three) times daily with meals. 07/08/15   Hildred Priest, MD  propranolol (INDERAL) 10 MG tablet Take 10 mg by mouth 3 (three) times daily.    Historical Provider, MD  tiZANidine (ZANAFLEX) 4 MG tablet Take 1 tablet (4 mg total) by mouth every 6 (six) hours as needed for muscle spasms. 04/16/16   Meade Maw, MD  traZODone (DESYREL) 100 MG tablet Take 25 mg by  mouth 3 (three) times daily.    Historical Provider, MD  traZODone (DESYREL) 150 MG tablet Take 1 tablet (150 mg total) by mouth at bedtime. 07/08/15   Hildred Priest, MD  trazodone (DESYREL) 300 MG tablet Take 300 mg by mouth at bedtime.    Historical Provider, MD  traZODone (DESYREL) 50 MG tablet Take 50 mg by mouth as needed (for break through anxiety).    Historical Provider, MD  venlafaxine XR (EFFEXOR-XR) 150 MG 24 hr capsule Take 2 capsules (300 mg total) by mouth daily with breakfast. 07/08/15   Hildred Priest, MD  venlafaxine XR (EFFEXOR-XR) 150 MG 24 hr capsule Take 150 mg by mouth daily with breakfast. 2 tablets in am.    Historical Provider, MD    Allergies  Allergen Reactions  . Benadryl [Diphenhydramine] Hives and Other (See Comments)    Reaction:  Hyperactivity   . Benadryl [Diphenhydramine] Other (See Comments)    My jaw locked and I could not speak, like a stroke.  . Cephalosporins Hives  . Codeine Nausea And Vomiting  . Codeine Nausea Only    severe    Family History  Problem Relation Age of Onset  . COPD Mother   . Alcoholism Father   . COPD Father     Social History Social History  Substance Use Topics  . Smoking status: Current Every Day Smoker    Packs/day: 0.25    Types: Cigarettes  . Smokeless tobacco: Never Used  . Alcohol use Yes     Comment: 12 pack beer past 7 days    Review of Systems Constitutional: Negative for fever Cardiovascular: Negative for chest pain. Respiratory: Negative for shortness of breath. Gastrointestinal: Negative for abdominal pain Neurological: Negative for headache 10-point ROS otherwise negative.  ____________________________________________   PHYSICAL EXAM:  VITAL SIGNS: ED Triage Vitals  Enc Vitals Group     BP 05/30/16 1109 (!) 142/71     Pulse Rate 05/30/16 1109 (!) 124     Resp 05/30/16 1109 20     Temp 05/30/16 1109 98.4 F (36.9 C)     Temp Source 05/30/16 1109 Oral     SpO2  05/30/16 1109 97 %     Weight 05/30/16 1109 145 lb (65.8 kg)     Height 05/30/16 1109 5\' 5"  (1.651 m)     Head Circumference --      Peak Flow --      Pain Score 05/30/16 1110 5     Pain Loc --      Pain Edu? --      Excl. in [place]? --     Constitutional: Alert and oriented. Well appearing and in no distress. Eyes: Normal exam ENT   Head: Normocephalic and atraumatic.   Mouth/Throat: Mucous membranes are moist. Cardiovascular: Normal rate, regular rhythm. No murmur Respiratory: Normal respiratory effort without tachypnea nor retractions. Breath sounds are clear Gastrointestinal: Soft and  nontender. No distention. Musculoskeletal: Nontender with normal range of motion in all extremities. Left upper extremity amputation Neurologic:  Normal speech and language. No gross focal neurologic deficits Skin:  Skin is warm, dry and intact.  Psychiatric: Mood and affect are normal. Speech and behavior are normal.   ____________________________________________    INITIAL IMPRESSION / ASSESSMENT AND PLAN / ED COURSE  Pertinent labs & imaging results that were available during my care of the patient were reviewed by me and considered in my medical decision making (see chart for details).  Patient presents the emergency department hoping to detox from alcohol. Her last drink was 5 PM yesterday. We will check labs, dose Ativan and Zofran, dose thiamine and discuss with behavioral health for detox arrangements.  Patient has no complaints at this time.  Patient's labs show an anion gap of 20. Patient states she has not been drinking much over the past several days besides alcohol. We will IV hydrate. I discussed with behavioral health unfortunately she is not a candidate for inpatient detox at our facility, and has insurance so will not be accepted [facility]. I discussed possible Librium treatment with the patient, she is agreeable to this, states she has done this in the past with success. Patient  seems to legitimately want to stop drinking alcohol. We will IV hydrate in the emergency department with 2 L of normal saline. We will discharge with Librium taper. Patient agreeable to this plan.    ____________________________________________   FINAL CLINICAL IMPRESSION(S) / ED DIAGNOSES  Alcohol withdrawal    Harvest Dark, MD 05/30/16 1414

## 2016-05-30 NOTE — BH Assessment (Signed)
Per the request of ER MD (Dr. Hedwig Morton) writer spoke with patient about her options for alcohol detox. Patient expressed she wanted to be admitted to [facility]. Writer explained to her, the [facility] no longer solely detox. However, in the event withdrawal symptoms does not warrant admission to the medical floor, patients are referred to other facilities. Patient then stated she want to remain at [facility] for 72 hours and she was admitted in the past. Writer shared with her, the option of the [facility] in [place] and [facility] in [place], if they have an available bed. Patient then stated she wanted to remain in [place] and wanted to be near to her two cats. Writer again explained to her, the options and the possibility of her not been admitted to [facility]. He further reiterated the criteria for admission for [facility]. Patient cannot be admitted to [facility] due to Medicare being her primary funding source. [facility] does not accept Medicare.  Writer discussed patient with ER MD (Dr. Kerman Passey) and her requests could not be met by [facility].  Patient denies SI/HI and AV/H/

## 2016-05-30 NOTE — ED Notes (Signed)
[name] consult called at 2009.

## 2016-05-30 NOTE — ED Triage Notes (Signed)
Pt to ED c/o alcohol withdrawal.  Patient states withdrawal symptoms started last night, has hx of withdrawals.  Drinking 12 pack of beer every day for 7 days.  Pt reports n/v/d, anxious, headache, tingling sensation.  Pt A&Ox4, speaking in complete and coherent sentences.  Pt brought in by other member of [place] group.

## 2016-05-31 MED ORDER — ONDANSETRON 4 MG PO TBDP: 4.0000 mg | ORAL_TABLET | Freq: Three times a day (TID) | ORAL | 0 refills | Status: DC | PRN

## 2016-05-31 MED ORDER — CHLORDIAZEPOXIDE HCL 10 MG PO CAPS: 10.0000 mg | ORAL_CAPSULE | Freq: Three times a day (TID) | ORAL | 0 refills | Status: DC | PRN

## 2016-05-31 NOTE — ED Notes (Signed)
Pt not tremulous at waking or during discussion to DC, upon hearing that she needed to go home pt began to shake "nobody is gonna come pick me up, yall got to give me something now"

## 2016-05-31 NOTE — ED Provider Notes (Signed)
Case discussed with psychiatry specialist on call after his assessment of the patient. Feels the patient is not significantly withdrawing, reports that the patient has revealed that she had been previously sober, and only relapsed within the last week, is at low risk for significant withdrawal. We'll provide a short prescription of Librium as well as Zofran for symptom control and discharged home to follow up with primary care. Medically and psychiatrically stable. Not a danger to herself or others, reported suicidal ideation does not appear to be a safety risk at this time.     Carrie Mew, MD 05/31/16 [phone]

## 2016-05-31 NOTE — ED Notes (Signed)
Pt and SOC to room 24 telepysch

## 2016-05-31 NOTE — ED Notes (Signed)
Case discussed with Dr Joni Fears, pt to be discharged

## 2016-07-02 ENCOUNTER — Encounter: Payer: Self-pay | Admitting: *Deleted

## 2016-07-02 ENCOUNTER — Emergency Department: Payer: Medicare Other

## 2016-07-02 ENCOUNTER — Emergency Department
Admission: EM | Admit: 2016-07-02 | Discharge: 2016-07-05 | Disposition: A | Discharge status: Other Acute Inpatient Hospital | Payer: Medicare Other | Attending: Emergency Medicine | Admitting: Emergency Medicine

## 2016-07-02 DIAGNOSIS — Z79899 Other long term (current) drug therapy: Secondary | ICD-10-CM | POA: Insufficient documentation

## 2016-07-02 DIAGNOSIS — F1020 Alcohol dependence, uncomplicated: Secondary | ICD-10-CM | POA: Diagnosis present

## 2016-07-02 DIAGNOSIS — F17210 Nicotine dependence, cigarettes, uncomplicated: Secondary | ICD-10-CM | POA: Insufficient documentation

## 2016-07-02 DIAGNOSIS — F10239 Alcohol dependence with withdrawal, unspecified: Secondary | ICD-10-CM | POA: Diagnosis present

## 2016-07-02 DIAGNOSIS — T43211A Poisoning by selective serotonin and norepinephrine reuptake inhibitors, accidental (unintentional), initial encounter: Secondary | ICD-10-CM | POA: Insufficient documentation

## 2016-07-02 DIAGNOSIS — J45909 Unspecified asthma, uncomplicated: Secondary | ICD-10-CM | POA: Diagnosis not present

## 2016-07-02 DIAGNOSIS — F332 Major depressive disorder, recurrent severe without psychotic features: Secondary | ICD-10-CM | POA: Diagnosis not present

## 2016-07-02 DIAGNOSIS — T50901A Poisoning by unspecified drugs, medicaments and biological substances, accidental (unintentional), initial encounter: Secondary | ICD-10-CM

## 2016-07-02 DIAGNOSIS — F411 Generalized anxiety disorder: Secondary | ICD-10-CM | POA: Diagnosis present

## 2016-07-02 DIAGNOSIS — F3164 Bipolar disorder, current episode mixed, severe, with psychotic features: Secondary | ICD-10-CM

## 2016-07-02 DIAGNOSIS — F4310 Post-traumatic stress disorder, unspecified: Secondary | ICD-10-CM | POA: Diagnosis present

## 2016-07-02 DIAGNOSIS — S48112A Complete traumatic amputation at level between left shoulder and elbow, initial encounter: Secondary | ICD-10-CM

## 2016-07-02 DIAGNOSIS — F10939 Alcohol use, unspecified with withdrawal, unspecified: Secondary | ICD-10-CM | POA: Diagnosis present

## 2016-07-02 DIAGNOSIS — T43201A Poisoning by unspecified antidepressants, accidental (unintentional), initial encounter: Secondary | ICD-10-CM | POA: Diagnosis present

## 2016-07-02 LAB — URINE DRUG SCREEN, QUALITATIVE (ARMC ONLY)
AMPHETAMINES, UR SCREEN: NOT DETECTED
BARBITURATES, UR SCREEN: NOT DETECTED
BENZODIAZEPINE, UR SCRN: NOT DETECTED
CANNABINOID 50 NG, UR ~~LOC~~: NOT DETECTED
Cocaine Metabolite,Ur ~~LOC~~: NOT DETECTED
MDMA (Ecstasy)Ur Screen: NOT DETECTED
Methadone Scn, Ur: NOT DETECTED
OPIATE, UR SCREEN: NOT DETECTED
PHENCYCLIDINE (PCP) UR S: NOT DETECTED
Tricyclic, Ur Screen: NOT DETECTED

## 2016-07-02 LAB — COMPREHENSIVE METABOLIC PANEL
ALK PHOS: 79 U/L (ref 38–126)
ALT: 24 U/L (ref 14–54)
ANION GAP: 14 (ref 5–15)
AST: 22 U/L (ref 15–41)
Albumin: 4.2 g/dL (ref 3.5–5.0)
BUN: 18 mg/dL (ref 6–20)
CALCIUM: 9.4 mg/dL (ref 8.9–10.3)
CO2: 18 mmol/L — ABNORMAL LOW (ref 22–32)
Chloride: 100 mmol/L — ABNORMAL LOW (ref 101–111)
Creatinine, Ser: 1.14 mg/dL — ABNORMAL HIGH (ref 0.44–1.00)
GFR, EST AFRICAN AMERICAN: 58 mL/min — AB (ref >60)
GFR, EST NON AFRICAN AMERICAN: 50 mL/min — AB (ref >60)
Glucose, Bld: 89 mg/dL (ref 65–99)
Potassium: 3.9 mmol/L (ref 3.5–5.1)
Sodium: 132 mmol/L — ABNORMAL LOW (ref 135–145)
Total Bilirubin: 0.2 mg/dL — ABNORMAL LOW (ref 0.3–1.2)
Total Protein: 7.8 g/dL (ref 6.5–8.1)

## 2016-07-02 LAB — URINALYSIS, COMPLETE (UACMP) WITH MICROSCOPIC
Bilirubin Urine: NEGATIVE
Glucose, UA: NEGATIVE mg/dL
KETONES UR: 20 mg/dL — AB
Leukocytes, UA: NEGATIVE
NITRITE: NEGATIVE
PH: 6 (ref 5.0–8.0)
Protein, ur: NEGATIVE mg/dL
SPECIFIC GRAVITY, URINE: 1.004 — AB (ref 1.005–1.030)

## 2016-07-02 LAB — CBC
HCT: 43.5 % (ref 35.0–47.0)
Hemoglobin: 14.9 g/dL (ref 12.0–16.0)
MCH: 31.1 pg (ref 26.0–34.0)
MCHC: 34.2 g/dL (ref 32.0–36.0)
MCV: 91.1 fL (ref 80.0–100.0)
Platelets: 243 10*3/uL (ref 150–440)
RBC: 4.78 MIL/uL (ref 3.80–5.20)
RDW: 14.6 % — ABNORMAL HIGH (ref 11.5–14.5)
WBC: 18.1 10*3/uL — ABNORMAL HIGH (ref 3.6–11.0)

## 2016-07-02 LAB — ETHANOL: ALCOHOL ETHYL (B): 49 mg/dL — AB (ref <5)

## 2016-07-02 LAB — ACETAMINOPHEN LEVEL

## 2016-07-02 LAB — SALICYLATE LEVEL

## 2016-07-02 MED ORDER — SODIUM CHLORIDE 0.9 % IV BOLUS (SEPSIS)
1000.0000 mL | Freq: Once | INTRAVENOUS | Status: AC
  Administered 2016-07-02: 1000 mL via INTRAVENOUS

## 2016-07-02 MED ORDER — SODIUM CHLORIDE 0.9 % IV BOLUS (SEPSIS)
500.0000 mL | Freq: Once | INTRAVENOUS | Status: AC
  Administered 2016-07-02: 500 mL via INTRAVENOUS

## 2016-07-02 MED ORDER — THIAMINE HCL 100 MG/ML IJ SOLN
100.0000 mg | Freq: Once | INTRAMUSCULAR | Status: AC
Start: 2016-07-02 — End: 2016-07-02
  Administered 2016-07-02: 100 mg via INTRAVENOUS
  Filled 2016-07-02: qty 2

## 2016-07-02 MED ORDER — ONDANSETRON HCL 4 MG/2ML IJ SOLN
INTRAMUSCULAR | Status: AC
  Filled 2016-07-02: qty 2

## 2016-07-02 MED ORDER — ONDANSETRON HCL 4 MG/2ML IJ SOLN
4.0000 mg | Freq: Once | INTRAMUSCULAR | Status: AC
  Administered 2016-07-02: 4 mg via INTRAVENOUS

## 2016-07-02 MED ORDER — SODIUM CHLORIDE 0.9 % IV SOLN
1.0000 mg | Freq: Once | INTRAVENOUS | Status: AC
  Administered 2016-07-02: 1 mg via INTRAVENOUS
  Filled 2016-07-02: qty 0.2

## 2016-07-02 NOTE — BHH Counselor (Signed)
Patient assessed by T. Lavina Hamman, NP at [facility].  Patient recommended for inpatient hospitalization.  Ran patient be Dr. Weber Cooks for admission to [facility].  Confirmed with Abbi, BMU charge nurse that beds are available on the BMU.  Awaiting orders from Dr. Weber Cooks.

## 2016-07-02 NOTE — ED Notes (Signed)
X-ray is in the room taking X-rays of the Pt.

## 2016-07-02 NOTE — ED Notes (Signed)
Pt consulted with [facility].

## 2016-07-02 NOTE — ED Notes (Signed)
Patient alert and oriented.  States she no longer feels nauseous.

## 2016-07-02 NOTE — ED Notes (Signed)
Pt states that she took the 1,050mg  of Trazodone and alcohol at 15:00. ED MD and he wants to keep the Pt in the ED for one more hour for monitoring then the Pt can go to the [place] per poison control recommendations.

## 2016-07-02 NOTE — ED Notes (Signed)
CJ from poison control called to get an update on the Pt and he said that he was going to close the Pt's case.

## 2016-07-02 NOTE — BH Assessment (Signed)
Tele Assessment Note   [name] is an 61 y.o. female who presented voluntarily to [name] with complaint of suicidal ideation, suicide attempt, depressive symptoms, and alcohol use.  Pt provided history.  Pt has a history of depressive symptoms, alcohol dependence, and suicidal ideation.  She reported that over the last week, she has experienced the worsening of the following symptoms:  Persistent and unremitting despondency, insomnia, tearfulness, and feelings of hopelessness/worthlessness.  Pt also reported that over the last week, she has ingested about a 12 pack of beer per day.  Pt stated that earlier today, she was despondent and decided to attempt suicide by drinking a 12-pack and ingesting seven 150 mg Trazodone tabs.    Pt reported that she had attempted suicide on one previous occasion "about ten years ago."  She did not provide any details.  Pt indicated that she is treated for alcohol use and mood disorders at [facility] in [place].  She stated that she sees Dr. Leonides Schanz and is in therapy.  Pt was last assessed by TTS in January 2017.  At that time, Pt was assessed for suicidal ideation.  She was recommended inpatient.  During assessment, Pt presented as drowsy and oriented.  She had fair eye contact.  Demeanor was calm.  Pt was dressed in scrubs, and she appeared appropriately groomed.  Pt's mood was depressed, and affect was congruent.  Pt endorsed ongoing suicidal ideation with plan and intent (stating that ingestion of alcohol and pills today was a suicide attempt), as well as ongoing depressive symptoms (see above).  Pt endorsed ongoing use of alcohol, and that she recently began consuming a 12 pack per day.  Pt denied auditory/visual hallucination, self-injury, homicidal ideation.  Pt's speech was normal in rate, rhythm, and volume.  Pt's thought processes were within normal range.  Thought content was goal-oriented and logical.  There was no evidence of delusion.  Pt's memory and concentration  were grossly intact.  Insight, judgment, and impulse control were deemed poor.  Consulted with Berneta Levins, NP who recommended inpatient placement.   Diagnosis: Major Depressive Disorder, Severe, w/o psychotic features; Alcohol Use Disorder  Past Medical History:  Past Medical History:  Diagnosis Date  . Anxiety   . Anxiety    panic anxiety disorder  . Arthritis   . Asthma    chronic asthmatic bronchitis  . Cancer [facility])    Desmoid tumor left forearm  . Depression   . ETOH abuse   . Heart murmur   . Insomnia   . Neuropathy [facility])     Past Surgical History:  Procedure Laterality Date  . ABDOMINAL HYSTERECTOMY    . ABDOMINAL HYSTERECTOMY  2007  . 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 ARM AMPUTATION AT SHOULDER Left   . CESAREAN SECTION    . [place]   x 2  . JOINT REPLACEMENT Right 2012   hip  . LUMBAR LAMINECTOMY/DECOMPRESSION MICRODISCECTOMY Left 04/16/2016   Procedure: LUMBAR LAMINECTOMY/DECOMPRESSION MICRODISCECTOMY 1 LEVEL;  Surgeon: Meade Maw, MD;  Location: [facility] ORS;  Service: Neurosurgery;  Laterality: Left;  Left L4-5 far lateral discectomy, left L4-5 laminoforaminotomy  . TOTAL HIP ARTHROPLASTY Left 09/17/2015   Procedure: TOTAL HIP ARTHROPLASTY;  Surgeon: Dereck Leep, MD;  Location: [facility] ORS;  Service: Orthopedics;  Laterality: Left;    Family History:  Family History  Problem Relation Age of Onset  . COPD Mother   . Alcoholism Father   .  COPD Father     Social History:  reports that she has been smoking Cigarettes.  She has been smoking about 0.25 packs per day. She has never used smokeless tobacco. She reports that she drinks alcohol. She reports that she does not use drugs.  Additional Social History:  Alcohol / Drug Use Pain Medications: See PTA Prescriptions: See PTA Over the Counter: See PTA History of alcohol / drug use?: Yes Substance #1 Name of Substance 1: Alcohol 1 - Amount (size/oz): 12  pack 1 - Frequency: Daily 1 - Duration: Ongoing 1 - Last Use / Amount: 07/02/16  CIWA: CIWA-Ar BP: (!) 102/57 Pulse Rate: 72 COWS:    PATIENT STRENGTHS: (choose at least two) Average or above average intelligence Capable of independent living  Allergies:  Allergies  Allergen Reactions  . Benadryl [Diphenhydramine] Hives and Other (See Comments)    Reaction:  Hyperactivity   . Benadryl [Diphenhydramine] Other (See Comments)    My jaw locked and I could not speak, like a stroke.  . Cephalosporins Hives  . Codeine Nausea And Vomiting  . Codeine Nausea Only    severe    Home Medications:  (Not in a hospital admission)  OB/GYN Status:  No LMP recorded. Patient has had a hysterectomy.  General Assessment Data Location of Assessment: [name] TTS Assessment: In system Is this a Tele or Face-to-Face Assessment?: Tele Assessment Is this an Initial Assessment or a Re-assessment for this encounter?: Initial Assessment Marital status: Divorced Is patient pregnant?: No Pregnancy Status: No Living Arrangements: Alone Can pt return to current living arrangement?: Yes Admission Status: Voluntary Is patient capable of signing voluntary admission?: Yes Referral Source: Self/Family/Friend Insurance type: [place] MCD     Crisis Care Plan Living Arrangements: Alone Name of Psychiatrist: Dr. Leonides Schanz Name of Therapist: [facility]  Education Status Is patient currently in school?: No  Risk to self with the past 6 months Suicidal Ideation: Yes-Currently Present Has patient been a risk to self within the past 6 months prior to admission? : No Suicidal Intent: Yes-Currently Present Has patient had any suicidal intent within the past 6 months prior to admission? : No Is patient at risk for suicide?: Yes Suicidal Plan?: Yes-Currently Present Has patient had any suicidal plan within the past 6 months prior to admission? : No Specify Current Suicidal Plan: Pt ingested a 12 pack and 7 trazodone in  suicide attempt Access to Means: Yes What has been your use of drugs/alcohol within the last 12 months?: Alcohol Previous Attempts/Gestures: Yes How many times?: 1 Triggers for Past Attempts: Unknown Intentional Self Injurious Behavior: None Family Suicide History: Unknown Recent stressful life event(s): Other (Comment) ("I felt lonely") Persecutory voices/beliefs?: No Depression: Yes Depression Symptoms: Despondent, Insomnia, Tearfulness, Isolating, Fatigue, Loss of interest in usual pleasures, Feeling worthless/self pity Substance abuse history and/or treatment for substance abuse?: Yes Suicide prevention information given to non-admitted patients: Not applicable  Risk to Others within the past 6 months Homicidal Ideation: No Does patient have any lifetime risk of violence toward others beyond the six months prior to admission? : No Thoughts of Harm to Others: No Current Homicidal Intent: No Current Homicidal Plan: No Access to Homicidal Means: No History of harm to others?: No Assessment of Violence: None Noted Does patient have access to weapons?: No Criminal Charges Pending?: No Does patient have a court date: No Is patient on probation?: No  Psychosis Hallucinations: None noted Delusions: None noted  Mental Status Report Appearance/Hygiene: In scrubs, Unremarkable Eye Contact:  Fair Motor Activity: Unremarkable Speech: Logical/coherent Level of Consciousness: Drowsy Mood: Depressed Affect: Appropriate to circumstance Anxiety Level: None Thought Processes: Relevant, Coherent Judgement: Impaired Orientation: Person, Place, Time, Situation Obsessive Compulsive Thoughts/Behaviors: None  Cognitive Functioning Concentration: Fair Memory: Recent Intact, Remote Intact IQ: Average Insight: Poor Impulse Control: Poor Appetite: Fair Sleep: Decreased Vegetative Symptoms: None  ADLScreening [facility]) Patient's cognitive ability adequate to safely  complete daily activities?: Yes Patient able to express need for assistance with ADLs?: Yes Independently performs ADLs?: Yes (appropriate for developmental age)  Prior Inpatient Therapy Prior Inpatient Therapy: Yes Prior Therapy Dates: 2017 and other dates Prior Therapy Facilty/Provider(s): [facility] and others Reason for Treatment: SI, alcohol use  Prior Outpatient Therapy Prior Outpatient Therapy: Yes Prior Therapy Dates: Ongoing Prior Therapy Facilty/Provider(s): [facility] Reason for Treatment: Alcohol use, MDD Does patient have Intensive In-House Services?  : No Does patient have [facility] services? : No Does patient have P4CC services?: No  ADL Screening (condition at time of admission) Patient's cognitive ability adequate to safely complete daily activities?: Yes Is the patient deaf or have difficulty hearing?: No Does the patient have difficulty seeing, even when wearing glasses/contacts?: No Does the patient have difficulty concentrating, remembering, or making decisions?: No Patient able to express need for assistance with ADLs?: Yes Does the patient have difficulty dressing or bathing?: No Independently performs ADLs?: Yes (appropriate for developmental age) Does the patient have difficulty walking or climbing stairs?: No Weakness of Legs: None Weakness of Arms/Hands: None  Home Assistive Devices/Equipment Home Assistive Devices/Equipment: None  Therapy Consults (therapy consults require a physician order) PT Evaluation Needed: No OT Evalulation Needed: No SLP Evaluation Needed: No Abuse/Neglect Assessment (Assessment to be complete while patient is alone) Physical Abuse: Denies Verbal Abuse: Denies Sexual Abuse: Denies Exploitation of patient/patient's resources: Denies Self-Neglect: Denies Values / Beliefs Cultural Requests During Hospitalization: None Spiritual Requests During Hospitalization: None Consults Spiritual Care Consult Needed: No Social Work Consult  Needed: No Regulatory affairs officer (For Healthcare) Does Patient Have a Medical Advance Directive?: No    Additional Information 1:1 In Past 12 Months?: No CIRT Risk: No Elopement Risk: No Does patient have medical clearance?: Yes     Disposition:  Disposition Initial Assessment Completed for this Encounter: Yes Disposition of Patient: Inpatient treatment program Type of inpatient treatment program: Adult (Per T. Starkes, NP, Pt meets inpt criteria.)  Marlowe Aschoff 07/02/2016 6:02 PM

## 2016-07-02 NOTE — ED Provider Notes (Addendum)
[facility] Emergency Department Provider Note  ____________________________________________   I have reviewed the triage vital signs and the nursing notes.   HISTORY  Chief Complaint Drug Overdose    HPI [name] is a 61 y.o. female who presents today complaining of "I drank a bunch of alcohol and took some extra trazodone". Patient states she did this to kill herself. She wanted to "go to sleep and never wake up. She states she's been feeling depressed. Denies hearing voices or HI. According to EMS patient takes 7 150 mg trazodone tablets within the last 2 hours.     Past Medical History:  Diagnosis Date  . Anxiety   . Anxiety    panic anxiety disorder  . Arthritis   . Asthma    chronic asthmatic bronchitis  . Cancer [facility])    Desmoid tumor left forearm  . Depression   . ETOH abuse   . Heart murmur   . Insomnia   . Neuropathy [facility])     Patient Active Problem List   Diagnosis Date Noted  . S/P total hip arthroplasty 09/17/2015  . Severe recurrent major depression without psychotic features ([place]) 07/03/2015  . Alcohol use disorder, severe, dependence ([place]) 01/26/2015  . Tobacco use disorder 01/26/2015  . Alcohol withdrawal ([place]) 01/08/2015  . 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 GAD (generalized anxiety disorder) 12/24/2013  . PTSD (post-traumatic stress disorder) 12/22/2013    Past Surgical History:  Procedure Laterality Date  . ABDOMINAL HYSTERECTOMY    . ABDOMINAL HYSTERECTOMY  2007  . 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 ARM AMPUTATION AT SHOULDER Left   . CESAREAN SECTION    . [place]   x 2  . JOINT REPLACEMENT Right 2012   hip  . LUMBAR LAMINECTOMY/DECOMPRESSION MICRODISCECTOMY Left 04/16/2016   Procedure: LUMBAR LAMINECTOMY/DECOMPRESSION MICRODISCECTOMY 1 LEVEL;  Surgeon: Meade Maw, MD;  Location: [facility] ORS;  Service: Neurosurgery;   Laterality: Left;  Left L4-5 far lateral discectomy, left L4-5 laminoforaminotomy  . TOTAL HIP ARTHROPLASTY Left 09/17/2015   Procedure: TOTAL HIP ARTHROPLASTY;  Surgeon: Dereck Leep, MD;  Location: [facility] ORS;  Service: Orthopedics;  Laterality: Left;    Prior to Admission medications   Medication Sig Start Date End Date Taking? Authorizing Provider  chlordiazePOXIDE (LIBRIUM) 10 MG capsule Take 1 capsule (10 mg total) by mouth 3 (three) times daily as needed for anxiety or withdrawal. 05/31/16   Carrie Mew, MD  fluticasone [facility]) 50 MCG/ACT nasal spray Place 2 sprays into both nostrils as needed. At 3 pm.     Historical Provider, MD  folic acid (FOLVITE) 1 MG tablet Take 1 mg by mouth at bedtime.    Historical Provider, MD  gabapentin (NEURONTIN) 300 MG capsule Take 3 capsules (900 mg total) by mouth 3 (three) times daily with meals. 07/08/15   Hildred Priest, MD  HYDROcodone-acetaminophen (NORCO) 5-325 MG tablet Take 1 tablet by mouth every 6 (six) hours as needed for moderate pain. 04/16/16   Meade Maw, MD  ondansetron (ZOFRAN ODT) 4 MG disintegrating tablet Take 1 tablet (4 mg total) by mouth every 8 (eight) hours as needed for nausea or vomiting. 05/31/16   Carrie Mew, MD  propranolol (INDERAL) 10 MG tablet Take 1 tablet (10 mg total) by mouth 3 (three) times daily with meals. 07/08/15   Hildred Priest, MD  tiZANidine (ZANAFLEX) 4 MG tablet Take 1 tablet (  4 mg total) by mouth every 6 (six) hours as needed for muscle spasms. 04/16/16   Meade Maw, MD  traZODone (DESYREL) 150 MG tablet Take 1 tablet (150 mg total) by mouth at bedtime. 07/08/15   Hildred Priest, MD  venlafaxine XR (EFFEXOR-XR) 150 MG 24 hr capsule Take 2 capsules (300 mg total) by mouth daily with breakfast. 07/08/15   Hildred Priest, MD    Allergies Benadryl [diphenhydramine]; Benadryl [diphenhydramine]; Cephalosporins; Codeine; and Codeine  Family History   Problem Relation Age of Onset  . COPD Mother   . Alcoholism Father   . COPD Father     Social History Social History  Substance Use Topics  . Smoking status: Current Every Day Smoker    Packs/day: 0.25    Types: Cigarettes  . Smokeless tobacco: Never Used  . Alcohol use Yes     Comment: 12 pack beer past 7 days    Review of Systems Constitutional: No fever/chills Eyes: No visual changes. ENT: No sore throat. No stiff neck no neck pain Cardiovascular: Denies chest pain. Respiratory: Denies shortness of breath. Gastrointestinal:   Positive vomiting.  No diarrhea.  No constipation. Genitourinary: Negative for dysuria. Musculoskeletal: Negative lower extremity swelling Skin: Negative for rash. Neurological: Negative for severe headaches, focal weakness or numbness. 10-point ROS otherwise negative.  ____________________________________________   PHYSICAL EXAM:  VITAL SIGNS: ED Triage Vitals [07/02/16 1648]  Enc Vitals Group     BP (!) 136/116     Pulse Rate 83     Resp 20     Temp 98 F (36.7 C)     Temp Source Oral     SpO2 96 %     Weight 145 lb (65.8 kg)     Height 5\' 5"  (1.651 m)     Head Circumference      Peak Flow      Pain Score      Pain Loc      Pain Edu?      Excl. in [place]?     Constitutional: Alert and oriented.Appears depressed smells of alcohol but no acute medical distress Eyes: Conjunctivae are normal. PERRL. EOMI. Head: Atraumatic. Nose: No congestion/rhinnorhea. Mouth/Throat: Mucous membranes are moist.  Oropharynx non-erythematous. Neck: No stridor.   Nontender with no meningismus Cardiovascular: Normal rate, regular rhythm. Grossly normal heart sounds.  Good peripheral circulation. Respiratory: Normal respiratory effort.  No retractions. Lungs CTAB. Abdominal: Soft and nontender. No distention. No guarding no rebound Back:  There is no focal tenderness or step off.  there is no midline tenderness there are no lesions noted. there is no  CVA tenderness Musculoskeletal: No lower extremity tenderness, no upper extremity tenderness. No joint effusions, no DVT signs strong distal pulses no edema Neurologic:  Normal speech and language. No gross focal neurologic deficits are appreciated.  Skin:  Skin is warm, dry and intact. No rash noted. Psychiatric: Mood and affect are somewhat lachyrmose. Speech and behavior are normal.  ____________________________________________   LABS (all labs ordered are listed, but only abnormal results are displayed)  Labs Reviewed  COMPREHENSIVE METABOLIC PANEL  ETHANOL  SALICYLATE LEVEL  ACETAMINOPHEN LEVEL  CBC  URINE DRUG SCREEN, QUALITATIVE ([place])  CBG MONITORING, ED   ____________________________________________  EKG  I personally interpreted any EKGs ordered by me or triage  ____________________________________________  RADIOLOGY  I reviewed any imaging ordered by me or triage that were performed during my shift and, if possible, patient and/or family made aware of any abnormal findings. ____________________________________________  PROCEDURES  Procedure(s) performed: None  Procedures  Critical Care performed: None  ____________________________________________   INITIAL IMPRESSION / ASSESSMENT AND PLAN / ED COURSE  Pertinent labs & imaging results that were available during my care of the patient were reviewed by me and considered in my medical decision making (see chart for details).  Patient here after an intentional overdose with thoughts of hurting herself will IVC. Trazodone is usually well-tolerated, will get EKG checked for Co. ingestions and reassess.  ----------------------------------------- 6:26 PM on 07/02/2016 -----------------------------------------  We do get initially low blood pressure reading, I not certain that was real because when we rechecked it was concerned with higher, we have given her IV fluid nonetheless. Thiamine and folate as  well. Salicylate and acetaminophen levels are negative. I did talk to poison control. They feel supportive cares although stated for this kind of overdose. We will continue to observe her closely here. At this time she is in no acute distress.  ----------------------------------------- 9:04 PM on 07/02/2016 -----------------------------------------  She remains in no acute distress wakes up easily is oriented, she is tired but otherwise non-toxic.  ----------------------------------------- 11:39 PM on 07/02/2016 -----------------------------------------  continues well appearing no source of ongoing infection.  bp responding well to ivf. Psychiatry will admit.  I have asked Dr. Owens Shark to watch the pateint for a while longer to ensure vs stablized.   Clinical Course    ____________________________________________   FINAL CLINICAL IMPRESSION(S) / ED DIAGNOSES  Final diagnoses:  None      This chart was dictated using voice recognition software.  Despite best efforts to proofread,  errors can occur which can change meaning.      Schuyler Amor, MD 07/02/16 Leon, MD 07/02/16 [id number]    Schuyler Amor, MD 07/02/16 2105    Schuyler Amor, MD 07/02/16 [phone]

## 2016-07-02 NOTE — ED Triage Notes (Signed)
Pt took 7 150 mg Trazodone today and drank 12 pack of beer, pt states" I just didn't want to wake up", pt reports attempting to detox herself from alcohol,, pt alert and oriented , pale and diaphoretic

## 2016-07-02 NOTE — ED Notes (Signed)
PT IVC PENDING CONSULT  

## 2016-07-03 DIAGNOSIS — T43201A Poisoning by unspecified antidepressants, accidental (unintentional), initial encounter: Secondary | ICD-10-CM | POA: Diagnosis present

## 2016-07-03 DIAGNOSIS — T43211A Poisoning by selective serotonin and norepinephrine reuptake inhibitors, accidental (unintentional), initial encounter: Secondary | ICD-10-CM | POA: Diagnosis not present

## 2016-07-03 DIAGNOSIS — F332 Major depressive disorder, recurrent severe without psychotic features: Secondary | ICD-10-CM | POA: Diagnosis not present

## 2016-07-03 MED ORDER — LORAZEPAM 1 MG PO TABS
1.0000 mg | ORAL_TABLET | Freq: Four times a day (QID) | ORAL | Status: DC | PRN
  Administered 2016-07-03 – 2016-07-05 (×6): 1 mg via ORAL
  Filled 2016-07-03 (×6): qty 1

## 2016-07-03 MED ORDER — ADULT MULTIVITAMIN W/MINERALS CH
1.0000 | ORAL_TABLET | Freq: Every day | ORAL | Status: DC
  Administered 2016-07-03 – 2016-07-04 (×2): 1 via ORAL
  Filled 2016-07-03 (×2): qty 1

## 2016-07-03 MED ORDER — VENLAFAXINE HCL ER 75 MG PO CP24
150.0000 mg | ORAL_CAPSULE | Freq: Every day | ORAL | Status: DC
  Administered 2016-07-04: 150 mg via ORAL
  Filled 2016-07-03: qty 2

## 2016-07-03 MED ORDER — PROPRANOLOL HCL 10 MG PO TABS
10.0000 mg | ORAL_TABLET | Freq: Three times a day (TID) | ORAL | Status: DC
  Administered 2016-07-03 – 2016-07-04 (×3): 10 mg via ORAL
  Filled 2016-07-03 (×4): qty 1

## 2016-07-03 MED ORDER — CHLORDIAZEPOXIDE HCL 25 MG PO CAPS
50.0000 mg | ORAL_CAPSULE | Freq: Once | ORAL | Status: AC
  Administered 2016-07-03: 50 mg via ORAL
  Filled 2016-07-03: qty 2

## 2016-07-03 MED ORDER — CYCLOBENZAPRINE HCL 10 MG PO TABS: 5.0000 mg | ORAL_TABLET | Freq: Three times a day (TID) | ORAL | Status: DC | PRN

## 2016-07-03 MED ORDER — FOLIC ACID 1 MG PO TABS
1.0000 mg | ORAL_TABLET | Freq: Every day | ORAL | Status: DC
Start: 2016-07-03 — End: 2016-07-05
  Administered 2016-07-03 – 2016-07-04 (×2): 1 mg via ORAL
  Filled 2016-07-03 (×2): qty 1

## 2016-07-03 MED ORDER — LORAZEPAM 2 MG/ML IJ SOLN: 1.0000 mg | Freq: Four times a day (QID) | INTRAMUSCULAR | Status: DC | PRN

## 2016-07-03 MED ORDER — GABAPENTIN 300 MG PO CAPS
900.0000 mg | ORAL_CAPSULE | Freq: Three times a day (TID) | ORAL | Status: DC
  Administered 2016-07-03 – 2016-07-04 (×5): 900 mg via ORAL
  Filled 2016-07-03 (×5): qty 3

## 2016-07-03 MED ORDER — HALOPERIDOL LACTATE 5 MG/ML IJ SOLN
5.0000 mg | Freq: Once | INTRAMUSCULAR | Status: AC
  Administered 2016-07-03: 5 mg via INTRAMUSCULAR
  Filled 2016-07-03: qty 1

## 2016-07-03 MED ORDER — THIAMINE HCL 100 MG/ML IJ SOLN: 100.0000 mg | Freq: Every day | INTRAMUSCULAR | Status: DC

## 2016-07-03 MED ORDER — VITAMIN B-1 100 MG PO TABS
100.0000 mg | ORAL_TABLET | Freq: Every day | ORAL | Status: DC
  Administered 2016-07-03 – 2016-07-04 (×2): 100 mg via ORAL
  Filled 2016-07-03 (×2): qty 1

## 2016-07-03 NOTE — ED Notes (Signed)
Zerenity Bowron RN helped the Pt to the bathroom and helped her change her dipper.

## 2016-07-03 NOTE — ED Notes (Signed)
Pt is asleep this evening. She wakes up to take medications and is pleasant and cooperative with staff. Will continue to monitor for signs for withdrawal and administer mediations as prescribed. Pt is in no distress at this time.

## 2016-07-03 NOTE — ED Notes (Signed)
Pt resting in bed, pt ate breakfast, ginger ale given

## 2016-07-03 NOTE — ED Provider Notes (Signed)
-----------------------------------------   7:35 AM on 07/03/2016 -----------------------------------------   Blood pressure (!) 102/41, pulse 74, temperature 98.4 F (36.9 C), temperature source Oral, resp. rate 12, height 5\' 5"  (1.651 m), weight 145 lb (65.8 kg), SpO2 95 %.  The patient had no acute events since last update.  Calm and cooperative at this time.  Disposition is pending Psychiatry/Behavioral Medicine team recommendations.     Daymon Larsen, MD 07/03/16 [phone]

## 2016-07-03 NOTE — ED Notes (Signed)
Patient given meal tray.

## 2016-07-03 NOTE — Consult Note (Signed)
[facility] Consult   Reason for Consult:  Consult for 61 year old woman with a history of depression and alcohol abuse who came to the hospital after an intentional overdose Referring Physician:  Marcelene Butte Patient Identification: GRIFFIN DEWILDE MRN:  [id number] Principal Diagnosis: Severe recurrent major depression without psychotic features [facility]) Diagnosis:   Patient Active Problem List   Diagnosis Date Noted  . Overdose of antidepressant [T43.201A] 07/03/2016  . 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 Severe recurrent major depression without psychotic features ([place]) [F33.2] 07/03/2015  . Alcohol use disorder, severe, dependence ([place]) [F10.20] 01/26/2015  . Tobacco use disorder [F17.200] 01/26/2015  . Alcohol withdrawal ([place]) [F10.239] 01/08/2015  . 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 GAD (generalized anxiety disorder) [F41.1] 12/24/2013  . PTSD (post-traumatic stress disorder) [F43.10] 12/22/2013    Total Time spent with patient: 1 hour  Subjective:   [name] is a 61 y.o. female patient admitted with "I feel lousy".  HPI:  Patient interviewed. Chart reviewed. Patient known from previous encounters. 61 year old woman has a long-standing history of alcohol abuse. She took 8 of her 150 mg strength trazodone pills last night with suicidal intention. Her peer support caseworker from mental health called 911 to have her brought to the hospital. Patient had also been drinking heavily consuming about a 12 pack a day for the last 3 weeks. Her mood is been very depressed. Suicidal thoughts. Feelings of hopelessness. Physically feeling terrible and run down. Poor sleep and poor appetite. She has been compliant with her outpatient medications.  Social history: Patient lives by herself. She does have some supportive family in the area. When she is not drinking she has a reasonable social life.  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 Patient has also had a hip replacement and back surgery this year and has some chronic pain there.  Substance abuse history: Long-standing alcohol abuse. She claims she had been sober for about 9 months prior to this Christmas which is when she relapsed. She had been going to [employer] fairly steadily for a while. She does not abuse other drugs. Does not have a history of delirium tremens  Past Psychiatric History: Patient does have a past history of severe depression as well as alcohol abuse. Past history of suicidality. No history of psychotic symptoms. Has been compliant with outpatient treatment for the most part  Risk to Self: Suicidal Ideation: Yes-Currently Present Suicidal Intent: Yes-Currently Present Is patient at risk for suicide?: Yes Suicidal Plan?: Yes-Currently Present Specify Current Suicidal Plan: Pt ingested a 12 pack and 7 trazodone in suicide attempt Access to Means: Yes What has been your use of drugs/alcohol within the last 12 months?: Alcohol How many times?: 1 Triggers for Past Attempts: Unknown Intentional Self Injurious Behavior: None Risk to Others: Homicidal Ideation: No Thoughts of Harm to Others: No Current Homicidal Intent: No Current Homicidal Plan: No Access to Homicidal Means: No History of harm to others?: No Assessment of Violence: None Noted Does patient have access to weapons?: No Criminal Charges Pending?: No Does patient have a court date: No Prior Inpatient Therapy: Prior Inpatient Therapy: Yes Prior Therapy Dates: 2017 and other dates Prior Therapy Facilty/Provider(s): [facility] and others Reason for Treatment: SI, alcohol use Prior Outpatient Therapy: Prior Outpatient Therapy: Yes Prior Therapy Dates: Ongoing Prior Therapy Facilty/Provider(s): [facility] Reason for Treatment: Alcohol use, MDD Does  patient have Intensive In-House Services?  : No Does patient have  [facility] services? : No Does patient have P4CC services?: No  Past Medical History:  Past Medical History:  Diagnosis Date  . Anxiety   . Anxiety    panic anxiety disorder  . Arthritis   . Asthma    chronic asthmatic bronchitis  . Cancer [facility])    Desmoid tumor left forearm  . Depression   . ETOH abuse   . Heart murmur   . Insomnia   . Neuropathy [facility])     Past Surgical History:  Procedure Laterality Date  . ABDOMINAL HYSTERECTOMY    . ABDOMINAL HYSTERECTOMY  2007  . 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 ARM AMPUTATION AT SHOULDER Left   . CESAREAN SECTION    . [place]   x 2  . JOINT REPLACEMENT Right 2012   hip  . LUMBAR LAMINECTOMY/DECOMPRESSION MICRODISCECTOMY Left 04/16/2016   Procedure: LUMBAR LAMINECTOMY/DECOMPRESSION MICRODISCECTOMY 1 LEVEL;  Surgeon: Meade Maw, MD;  Location: [facility] ORS;  Service: Neurosurgery;  Laterality: Left;  Left L4-5 far lateral discectomy, left L4-5 laminoforaminotomy  . TOTAL HIP ARTHROPLASTY Left 09/17/2015   Procedure: TOTAL HIP ARTHROPLASTY;  Surgeon: Dereck Leep, MD;  Location: [facility] ORS;  Service: Orthopedics;  Laterality: Left;   Family History:  Family History  Problem Relation Age of Onset  . COPD Mother   . Alcoholism Father   . COPD Father    Family Psychiatric  History: Positive for alcohol abuse in her father and depression and multiple women in the family Social History:  History  Alcohol Use  . Yes    Comment: 12 pack beer per day for last week     History  Drug Use No    Comment: Patient denies     Social History   Social History  . Marital status: Divorced    Spouse name: N/A  . Number of children: N/A  . Years of education: N/A   Social History Main Topics  . Smoking status: Current Every Day Smoker    Packs/day: 0.25    Types: Cigarettes  . Smokeless tobacco: Never Used  . Alcohol use Yes     Comment: 12 pack beer per day for last week  . Drug use:  No     Comment: Patient denies   . Sexual activity: Yes   Other Topics Concern  . None   Social History Narrative   ** Merged History Encounter **       Additional Social History:    Allergies:   Allergies  Allergen Reactions  . Benadryl [Diphenhydramine] Hives and Other (See Comments)    Reaction:  Hyperactivity   . Benadryl [Diphenhydramine] Other (See Comments)    My jaw locked and I could not speak, like a stroke.  . Cephalosporins Hives  . Codeine Nausea And Vomiting  . Codeine Nausea Only    severe    Labs:  Results for orders placed or performed during the hospital encounter of 07/02/16 (from the past 48 hour(s))  Comprehensive metabolic panel     Status: Abnormal   Collection Time: 07/02/16  4:57 PM  Result Value Ref Range   Sodium 132 (L) 135 - 145 mmol/L   Potassium 3.9 3.5 - 5.1 mmol/L   Chloride 100 (L) 101 - 111 mmol/L   CO2 18 (L) 22 - 32 mmol/L   Glucose, Bld 89 65 - 99  mg/dL   BUN 18 6 - 20 mg/dL   Creatinine, Ser 1.14 (H) 0.44 - 1.00 mg/dL   Calcium 9.4 8.9 - 10.3 mg/dL   Total Protein 7.8 6.5 - 8.1 g/dL   Albumin 4.2 3.5 - 5.0 g/dL   AST 22 15 - 41 U/L   ALT 24 14 - 54 U/L   Alkaline Phosphatase 79 38 - 126 U/L   Total Bilirubin 0.2 (L) 0.3 - 1.2 mg/dL   GFR calc non Af Amer 50 (L) >60 mL/min   GFR calc Af Amer 58 (L) >60 mL/min    Comment: (NOTE) The eGFR has been calculated using the CKD EPI equation. This calculation has not been validated in all clinical situations. eGFR's persistently <60 mL/min signify possible Chronic Kidney Disease.    Anion gap 14 5 - 15  Ethanol     Status: Abnormal   Collection Time: 07/02/16  4:57 PM  Result Value Ref Range   Alcohol, Ethyl (B) 49 (H) <5 mg/dL    Comment:        LOWEST DETECTABLE LIMIT FOR SERUM ALCOHOL IS 5 mg/dL FOR MEDICAL PURPOSES ONLY   Salicylate level     Status: None   Collection Time: 07/02/16  4:57 PM  Result Value Ref Range   Salicylate Lvl <3.6 2.8 - 30.0 mg/dL   Acetaminophen level     Status: Abnormal   Collection Time: 07/02/16  4:57 PM  Result Value Ref Range   Acetaminophen (Tylenol), Serum <10 (L) 10 - 30 ug/mL    Comment:        THERAPEUTIC CONCENTRATIONS VARY SIGNIFICANTLY. A RANGE OF 10-30 ug/mL MAY BE AN EFFECTIVE CONCENTRATION FOR MANY PATIENTS. HOWEVER, SOME ARE BEST TREATED AT CONCENTRATIONS OUTSIDE THIS RANGE. ACETAMINOPHEN CONCENTRATIONS >150 ug/mL AT 4 HOURS AFTER INGESTION AND >50 ug/mL AT 12 HOURS AFTER INGESTION ARE OFTEN ASSOCIATED WITH TOXIC REACTIONS.   CBC     Status: Abnormal   Collection Time: 07/02/16  5:44 PM  Result Value Ref Range   WBC 18.1 (H) 3.6 - 11.0 K/uL   RBC 4.78 3.80 - 5.20 MIL/uL   Hemoglobin 14.9 12.0 - 16.0 g/dL   HCT 43.5 35.0 - 47.0 %   MCV 91.1 80.0 - 100.0 fL   MCH 31.1 26.0 - 34.0 pg   MCHC 34.2 32.0 - 36.0 g/dL   RDW 14.6 (H) 11.5 - 14.5 %   Platelets 243 150 - 440 K/uL  Urine Drug Screen, Qualitative     Status: None   Collection Time: 07/02/16  9:57 PM  Result Value Ref Range   Tricyclic, Ur Screen NONE DETECTED NONE DETECTED   Amphetamines, Ur Screen NONE DETECTED NONE DETECTED   MDMA (Ecstasy)Ur Screen NONE DETECTED NONE DETECTED   Cocaine Metabolite,Ur [place] NONE DETECTED NONE DETECTED   Opiate, Ur Screen NONE DETECTED NONE DETECTED   Phencyclidine (PCP) Ur S NONE DETECTED NONE DETECTED   Cannabinoid 50 Ng, Ur [place] NONE DETECTED NONE DETECTED   Barbiturates, Ur Screen NONE DETECTED NONE DETECTED   Benzodiazepine, Ur Scrn NONE DETECTED NONE DETECTED   Methadone Scn, Ur NONE DETECTED NONE DETECTED    Comment: (NOTE) 644  Tricyclics, urine               Cutoff 1000 ng/mL 200  Amphetamines, urine             Cutoff 1000 ng/mL 300  MDMA (Ecstasy), urine           Cutoff 500 ng/mL  400  Cocaine Metabolite, urine       Cutoff 300 ng/mL 500  Opiate, urine                   Cutoff 300 ng/mL 600  Phencyclidine (PCP), urine      Cutoff 25 ng/mL 700  Cannabinoid, urine              Cutoff  50 ng/mL 800  Barbiturates, urine             Cutoff 200 ng/mL 900  Benzodiazepine, urine           Cutoff 200 ng/mL 1000 Methadone, urine                Cutoff 300 ng/mL 1100 1200 The urine drug screen provides only a preliminary, unconfirmed 1300 analytical test result and should not be used for non-medical 1400 purposes. Clinical consideration and professional judgment should 1500 be applied to any positive drug screen result due to possible 1600 interfering substances. A more specific alternate chemical method 1700 must be used in order to obtain a confirmed analytical result.  1800 Gas chromato graphy / mass spectrometry (GC/MS) is the preferred 1900 confirmatory method.   Urinalysis, Complete w Microscopic     Status: Abnormal   Collection Time: 07/02/16  9:57 PM  Result Value Ref Range   Color, Urine STRAW (A) YELLOW   APPearance CLEAR (A) CLEAR   Specific Gravity, Urine 1.004 (L) 1.005 - 1.030   pH 6.0 5.0 - 8.0   Glucose, UA NEGATIVE NEGATIVE mg/dL   Hgb urine dipstick SMALL (A) NEGATIVE   Bilirubin Urine NEGATIVE NEGATIVE   Ketones, ur 20 (A) NEGATIVE mg/dL   Protein, ur NEGATIVE NEGATIVE mg/dL   Nitrite NEGATIVE NEGATIVE   Leukocytes, UA NEGATIVE NEGATIVE   RBC / HPF 0-5 0 - 5 RBC/hpf   WBC, UA 0-5 0 - 5 WBC/hpf   Bacteria, UA RARE (A) NONE SEEN   Squamous Epithelial / LPF 0-5 (A) NONE SEEN   Mucous PRESENT    Hyaline Casts, UA PRESENT     Current Facility-Administered Medications  Medication Dose Route Frequency Provider Last Rate Last Dose  . folic acid (FOLVITE) tablet 1 mg  1 mg Oral Daily Parry Po T Kordae Buonocore, MD      . LORazepam (ATIVAN) tablet 1 mg  1 mg Oral Q6H PRN Gonzella Lex, MD       Or  . LORazepam (ATIVAN) injection 1 mg  1 mg Intravenous Q6H PRN Gonzella Lex, MD      . multivitamin with minerals tablet 1 tablet  1 tablet Oral Daily Gonzella Lex, MD      . propranolol (INDERAL) tablet 10 mg  10 mg Oral TID Gonzella Lex, MD      . thiamine  (VITAMIN B-1) tablet 100 mg  100 mg Oral Daily Gonzella Lex, MD       Or  . thiamine (B-1) injection 100 mg  100 mg Intravenous Daily Gonzella Lex, MD      . Derrill Memo ON 07/04/2016] venlafaxine XR (EFFEXOR-XR) 24 hr capsule 150 mg  150 mg Oral Q breakfast Gonzella Lex, MD       Current Outpatient Prescriptions  Medication Sig Dispense Refill  . chlordiazePOXIDE (LIBRIUM) 10 MG capsule Take 1 capsule (10 mg total) by mouth 3 (three) times daily as needed for anxiety or withdrawal. 6 capsule 0  . fluticasone (FLONASE) 50 MCG/ACT nasal spray  Place 2 sprays into both nostrils as needed. At 3 pm.     . folic acid (FOLVITE) 1 MG tablet Take 1 mg by mouth at bedtime.    . gabapentin (NEURONTIN) 300 MG capsule Take 3 capsules (900 mg total) by mouth 3 (three) times daily with meals. 270 capsule 0  . HYDROcodone-acetaminophen (NORCO) 5-325 MG tablet Take 1 tablet by mouth every 6 (six) hours as needed for moderate pain. 30 tablet 0  . ondansetron (ZOFRAN ODT) 4 MG disintegrating tablet Take 1 tablet (4 mg total) by mouth every 8 (eight) hours as needed for nausea or vomiting. 20 tablet 0  . propranolol (INDERAL) 10 MG tablet Take 1 tablet (10 mg total) by mouth 3 (three) times daily with meals. 90 tablet 0  . tiZANidine (ZANAFLEX) 4 MG tablet Take 1 tablet (4 mg total) by mouth every 6 (six) hours as needed for muscle spasms. 30 tablet 0  . traZODone (DESYREL) 150 MG tablet Take 1 tablet (150 mg total) by mouth at bedtime. 30 tablet 0  . venlafaxine XR (EFFEXOR-XR) 150 MG 24 hr capsule Take 2 capsules (300 mg total) by mouth daily with breakfast. 60 capsule 0    Musculoskeletal: Strength & Muscle Tone: decreased Gait & Station: broad based Patient leans: N/A  Psychiatric Specialty Exam: Physical Exam  Nursing note and vitals reviewed. Constitutional: She appears well-developed. She appears toxic. She appears distressed.  HENT:  Head: Normocephalic and atraumatic.  Eyes: Conjunctivae are  normal. Pupils are equal, round, and reactive to light.  Neck: Normal range of motion.  Cardiovascular: Regular rhythm and normal heart sounds.   Respiratory: Effort normal. No respiratory distress.  GI: Soft.  Musculoskeletal: Normal range of motion.  Neurological: She is alert.  Skin: Skin is warm. She is diaphoretic.  Psychiatric: Her mood appears anxious. Her speech is delayed and slurred. She is slowed and withdrawn. Cognition and memory are impaired. She expresses impulsivity. She exhibits a depressed mood. She expresses suicidal ideation. She expresses suicidal plans. She exhibits abnormal recent memory.    Review of Systems  HENT: Negative.   Eyes: Negative.   Respiratory: Negative.   Cardiovascular: Negative.   Gastrointestinal: Negative.   Musculoskeletal: Negative.   Skin: Negative.   Neurological: Positive for dizziness, tremors, weakness and headaches.  Psychiatric/Behavioral: Positive for depression, substance abuse and suicidal ideas. Negative for hallucinations and memory loss. The patient is nervous/anxious and has insomnia.     Blood pressure 115/64, pulse 82, temperature 98.3 F (36.8 C), temperature source Oral, resp. rate 18, height $RemoveBe'5\' 5"'QvAMjnlXc$  (1.651 m), weight 65.8 kg (145 lb), SpO2 100 %.Body mass index is 24.13 kg/m.  General Appearance: Disheveled  Eye Contact:  Fair  Speech:  Slow and Slurred  Volume:  Decreased  Mood:  Depressed and Dysphoric  Affect:  Depressed and Tearful  Thought Process:  Disorganized  Orientation:  Full (Time, Place, and Person)  Thought Content:  Tangential  Suicidal Thoughts:  Yes.  with intent/plan  Homicidal Thoughts:  No  Memory:  Immediate;   Good Recent;   Fair Remote;   Fair  Judgement:  Impaired  Insight:  Fair  Psychomotor Activity:  Decreased and Tremor  Concentration:  Concentration: Fair  Recall:  [employer] of Knowledge:  Fair  Language:  Fair  Akathisia:  No  Handed:  Right  AIMS (if indicated):     Assets:   Communication Skills Desire for Improvement Housing Resilience  ADL's:  Impaired  Cognition:  Impaired,  Mild  Sleep:        Treatment Plan Summary: Daily contact with patient to assess and evaluate symptoms and progress in treatment, Medication management and Plan 61 year old woman with depression and alcohol abuse took an intentional overdose with suicidal intent. Still very depressed and tearful and withdrawn. Also having active alcohol withdrawal symptoms. Patient will be admitted to the psychiatric unit. Continue her Effexor as used for outpatient treatment. Alcohol withdrawal protocol in place. Supportive counseling. Case reviewed with ER physician TTS and nursing.  Disposition: Recommend psychiatric Inpatient admission when medically cleared. Supportive therapy provided about ongoing stressors.  Alethia Berthold, MD 07/03/2016 4:50 PM

## 2016-07-03 NOTE — ED Notes (Signed)
Pt resting in bed, eyes closed, resp even and unlabored

## 2016-07-03 NOTE — ED Notes (Addendum)
Pt dressed out in paper scrubs by this RN and Felicia NT, silver colored ring with blue stone placed in cup and stored in locker with pts belongings, TV turned on for pt, pt given ginger ale, pt asked for some medication for "withdrawls", EDP made aware, vital signs WNL, no orders obtained at this time

## 2016-07-03 NOTE — ED Notes (Signed)
Patient given ginger ale to drink 

## 2016-07-03 NOTE — ED Notes (Signed)
Pt pleasant and cooperative. Pt stated she has been "feeling real sad for a while now."  Pt endorses SI. Denies HI and AVH. Pt c/o pain 5/10 in back, as well as nerve pain. MD made aware, awaiting orders. Second mattress placed on bed, second pillow given. Pt made aware she will be transferred to BMU when bed available. Pt accepting. Maintained on 15 minute checks and observation by security camera for safety.

## 2016-07-03 NOTE — BHH Counselor (Signed)
Per Britt Bolognese, [facility] BMU Charge Nurse, there are no available beds this evening due to construction going on in some of the beds.  There are anticipated discharges tomorrow (07/04/16) and patient should be able to be admitted to the unit.

## 2016-07-03 NOTE — ED Notes (Signed)
Patient offered to take a shower. Patient states, "I can't stand by myself". Patient offered shower chair and assistance. Still refused.

## 2016-07-04 DIAGNOSIS — T43211A Poisoning by selective serotonin and norepinephrine reuptake inhibitors, accidental (unintentional), initial encounter: Secondary | ICD-10-CM | POA: Diagnosis not present

## 2016-07-04 DIAGNOSIS — F332 Major depressive disorder, recurrent severe without psychotic features: Secondary | ICD-10-CM | POA: Diagnosis not present

## 2016-07-04 NOTE — ED Notes (Signed)
Patient resting quietly in room. No noted distress or abnormal behaviors noted. Will continue 15 minute checks and observation by security camera for safety. 

## 2016-07-04 NOTE — ED Provider Notes (Signed)
-----------------------------------------   6:31 AM on 07/04/2016 -----------------------------------------   Blood pressure 121/66, pulse 84, temperature 98.3 F (36.8 C), temperature source Oral, resp. rate 18, height 5\' 5"  (1.651 m), weight 145 lb (65.8 kg), SpO2 98 %.  The patient had no acute events since last update.  Calm and cooperative at this time.  Disposition is pending Psychiatry/Behavioral Medicine team recommendations.     Paulette Blanch, MD 07/04/16 [phone]

## 2016-07-04 NOTE — ED Notes (Signed)
PT IVC/ PENDING PLACEMENT  

## 2016-07-04 NOTE — ED Notes (Signed)
Pt updated that she will be transferred to BMU when bed available. Pt  informed this transfer should be today, but there is no guarantee.  Pt accepting. Pt resting in room, watching TV. No concerns voiced. Maintained on 15 minute checks and observation by security camera for safety.

## 2016-07-04 NOTE — ED Notes (Signed)
Pt given dinner tray and soft drink. Pt ate 100% of dinner.  Pt offered shower again, still declines. Maintained on 15 minute checks and observation by security camera for safety.

## 2016-07-04 NOTE — ED Notes (Signed)
Pt only ate a couple bites of breakfast. Pt stated, "I'm really not that hungry. Ill wait until lunch."    Pt offered a shower. Pt declined.  Maintained on 15 minute checks and observation by security camera for safety.

## 2016-07-04 NOTE — ED Notes (Signed)
In bed, with eyes open on approach. Appears anxious and sweaty. NAD noted. States she continues to feel "rough" but states she understands that this part of the process. States she is hopeful to get to new unit tonight because she doesn't feel like she is getting better where she is currently. Support and encouragement provided. CIWA completed. Denies SI/HI/AVH and verbally contracts for safety. Encouraged pt to bathe and offered new linens and toiletries but pt declined. She denies pain. Will continue to monitor closely

## 2016-07-04 NOTE — Consult Note (Signed)
[place] [facility] Consult   Reason for Consult:  Consult for 61 year old woman with a history of depression and alcohol abuse who came to the hospital after an intentional overdose Referring Physician:  Marcelene Butte Patient Identification: [name] MRN:  [id number] Principal Diagnosis: Severe recurrent major depression without psychotic features [facility]) Diagnosis:   Patient Active Problem List   Diagnosis Date Noted  . Overdose of antidepressant [T43.201A] 07/03/2016  . 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 Severe recurrent major depression without psychotic features ([place]) [F33.2] 07/03/2015  . Alcohol use disorder, severe, dependence ([place]) [F10.20] 01/26/2015  . Tobacco use disorder [F17.200] 01/26/2015  . Alcohol withdrawal ([place]) [F10.239] 01/08/2015  . 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 GAD (generalized anxiety disorder) [F41.1] 12/24/2013  . PTSD (post-traumatic stress disorder) [F43.10] 12/22/2013    Total Time spent with patient: 20 minutes  Subjective:   [name] is a 61 y.o. female patient admitted with "I feel lousy".  Follow-up for 61 year old woman with depression and alcohol abuse. Patient is still in the emergency room today. Admission was delayed because of some of her medical needs. Patient has no change to her presentation. Still very depressed down flat passive suicidal thoughts. Vital signs are stable and she requires only a little bit of detox medicine. No sign of delirium. She has been ambulatory around the emergency room but mostly stays to herself.  HPI:  Patient interviewed. Chart reviewed. Patient known from previous encounters. 61 year old woman has a long-standing history of alcohol abuse. She took 8 of her 150 mg strength trazodone pills last night with suicidal intention. Her peer support caseworker from mental health called 911 to have her brought to the hospital. Patient had also been drinking  heavily consuming about a 12 pack a day for the last 3 weeks. Her mood is been very depressed. Suicidal thoughts. Feelings of hopelessness. Physically feeling terrible and run down. Poor sleep and poor appetite. She has been compliant with her outpatient medications.  Social history: Patient lives by herself. She does have some supportive family in the area. When she is not drinking she has a reasonable social life.  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 Patient has also had a hip replacement and back surgery this year and has some chronic pain there.  Substance abuse history: Long-standing alcohol abuse. She claims she had been sober for about 9 months prior to this Christmas which is when she relapsed. She had been going to [employer] fairly steadily for a while. She does not abuse other drugs. Does not have a history of delirium tremens  Past Psychiatric History: Patient does have a past history of severe depression as well as alcohol abuse. Past history of suicidality. No history of psychotic symptoms. Has been compliant with outpatient treatment for the most part  Risk to Self: Suicidal Ideation: Yes-Currently Present Suicidal Intent: Yes-Currently Present Is patient at risk for suicide?: Yes Suicidal Plan?: Yes-Currently Present Specify Current Suicidal Plan: Pt ingested a 12 pack and 7 trazodone in suicide attempt Access to Means: Yes What has been your use of drugs/alcohol within the last 12 months?: Alcohol How many times?: 1 Triggers for Past Attempts: Unknown Intentional Self Injurious Behavior: None Risk to Others: Homicidal Ideation: No Thoughts of Harm to Others: No Current Homicidal Intent: No Current Homicidal Plan: No Access to Homicidal Means: No History of harm  to others?: No Assessment of Violence: None Noted Does patient have access to weapons?: No Criminal Charges Pending?: No Does  patient have a court date: No Prior Inpatient Therapy: Prior Inpatient Therapy: Yes Prior Therapy Dates: 2017 and other dates Prior Therapy Facilty/Provider(s): [facility] and others Reason for Treatment: SI, alcohol use Prior Outpatient Therapy: Prior Outpatient Therapy: Yes Prior Therapy Dates: Ongoing Prior Therapy Facilty/Provider(s): [facility] Reason for Treatment: Alcohol use, MDD Does patient have Intensive In-House Services?  : No Does patient have [facility] services? : No Does patient have P4[facility] services?: No  Past Medical History:  Past Medical History:  Diagnosis Date  . Anxiety   . Anxiety    panic anxiety disorder  . Arthritis   . Asthma    chronic asthmatic bronchitis  . [facility])    Desmoid tumor left forearm  . Depression   . ETOH abuse   . Heart murmur   . Insomnia   . Neuropathy [facility])     Past Surgical History:  Procedure Laterality Date  . ABDOMINAL HYSTERECTOMY    . ABDOMINAL HYSTERECTOMY  2007  . 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 ARM AMPUTATION AT SHOULDER Left   . CESAREAN SECTION    . [place]   x 2  . JOINT REPLACEMENT Right 2012   hip  . LUMBAR LAMINECTOMY/DECOMPRESSION MICRODISCECTOMY Left 04/16/2016   Procedure: LUMBAR LAMINECTOMY/DECOMPRESSION MICRODISCECTOMY 1 LEVEL;  Surgeon: Meade Maw, MD;  Location: [facility] ORS;  Service: Neurosurgery;  Laterality: Left;  Left L4-5 far lateral discectomy, left L4-5 laminoforaminotomy  . TOTAL HIP ARTHROPLASTY Left 09/17/2015   Procedure: TOTAL HIP ARTHROPLASTY;  Surgeon: Dereck Leep, MD;  Location: [facility] ORS;  Service: Orthopedics;  Laterality: Left;   Family History:  Family History  Problem Relation Age of Onset  . COPD Mother   . Alcoholism Father   . COPD Father    Family Psychiatric  History: Positive for alcohol abuse in her father and depression and multiple women in the family Social History:  History  Alcohol Use  . Yes    Comment: 12 pack beer  per day for last week     History  Drug Use No    Comment: Patient denies     Social History   Social History  . Marital status: Divorced    Spouse name: N/A  . Number of children: N/A  . Years of education: N/A   Social History Main Topics  . Smoking status: Current Every Day Smoker    Packs/day: 0.25    Types: Cigarettes  . Smokeless tobacco: Never Used  . Alcohol use Yes     Comment: 12 pack beer per day for last week  . Drug use: No     Comment: Patient denies   . Sexual activity: Yes   Other Topics Concern  . None   Social History Narrative   ** Merged History Encounter **       Additional Social History:    Allergies:   Allergies  Allergen Reactions  . Benadryl [Diphenhydramine] Hives and Other (See Comments)    Reaction:  Hyperactivity   . Benadryl [Diphenhydramine] Other (See Comments)    My jaw locked and I could not speak, like a stroke.  . Cephalosporins Hives  . Codeine Nausea And Vomiting  . Codeine Nausea Only    severe    Labs:  Results for orders placed or performed during the hospital  encounter of 07/02/16 (from the past 48 hour(s))  Urine Drug Screen, Qualitative     Status: None   Collection Time: 07/02/16  9:57 PM  Result Value Ref Range   Tricyclic, Ur Screen NONE DETECTED NONE DETECTED   Amphetamines, Ur Screen NONE DETECTED NONE DETECTED   MDMA (Ecstasy)Ur Screen NONE DETECTED NONE DETECTED   Cocaine Metabolite,Ur [place] NONE DETECTED NONE DETECTED   Opiate, Ur Screen NONE DETECTED NONE DETECTED   Phencyclidine (PCP) Ur S NONE DETECTED NONE DETECTED   Cannabinoid 50 Ng, Ur [place] NONE DETECTED NONE DETECTED   Barbiturates, Ur Screen NONE DETECTED NONE DETECTED   Benzodiazepine, Ur Scrn NONE DETECTED NONE DETECTED   Methadone Scn, Ur NONE DETECTED NONE DETECTED    Comment: (NOTE) [id number]  Tricyclics, urine               Cutoff 1000 ng/mL 200  Amphetamines, urine             Cutoff 1000 ng/mL 300  MDMA (Ecstasy), urine           Cutoff 500  ng/mL 400  Cocaine Metabolite, urine       Cutoff 300 ng/mL 500  Opiate, urine                   Cutoff 300 ng/mL 600  Phencyclidine (PCP), urine      Cutoff 25 ng/mL 700  Cannabinoid, urine              Cutoff 50 ng/mL 800  Barbiturates, urine             Cutoff 200 ng/mL 900  Benzodiazepine, urine           Cutoff 200 ng/mL 1000 Methadone, urine                Cutoff 300 ng/mL 1100 1200 The urine drug screen provides only a preliminary, unconfirmed 1300 analytical test result and should not be used for non-medical 1400 purposes. Clinical consideration and professional judgment should 1500 be applied to any positive drug screen result due to possible 1600 interfering substances. A more specific alternate chemical method 1700 must be used in order to obtain a confirmed analytical result.  1800 Gas chromato graphy / mass spectrometry (GC/MS) is the preferred 1900 confirmatory method.   Urinalysis, Complete w Microscopic     Status: Abnormal   Collection Time: 07/02/16  9:57 PM  Result Value Ref Range   Color, Urine STRAW (A) YELLOW   APPearance CLEAR (A) CLEAR   Specific Gravity, Urine 1.004 (L) 1.005 - 1.030   pH 6.0 5.0 - 8.0   Glucose, UA NEGATIVE NEGATIVE mg/dL   Hgb urine dipstick SMALL (A) NEGATIVE   Bilirubin Urine NEGATIVE NEGATIVE   Ketones, ur 20 (A) NEGATIVE mg/dL   Protein, ur NEGATIVE NEGATIVE mg/dL   Nitrite NEGATIVE NEGATIVE   Leukocytes, UA NEGATIVE NEGATIVE   RBC / HPF 0-5 0 - 5 RBC/hpf   WBC, UA 0-5 0 - 5 WBC/hpf   Bacteria, UA RARE (A) NONE SEEN   Squamous Epithelial / LPF 0-5 (A) NONE SEEN   Mucous PRESENT    Hyaline Casts, UA PRESENT     Current Facility-Administered Medications  Medication Dose Route Frequency Provider Last Rate Last Dose  . cyclobenzaprine (FLEXERIL) tablet 5 mg  5 mg Oral TID PRN Gonzella Lex, MD      . folic acid (FOLVITE) tablet 1 mg  1 mg Oral Daily Selene Peltzer T Delyla Sandeen,  MD   1 mg at 07/04/16 0947  . gabapentin (NEURONTIN) capsule  900 mg  900 mg Oral TID Gonzella Lex, MD   900 mg at 07/04/16 1533  . LORazepam (ATIVAN) tablet 1 mg  1 mg Oral Q6H PRN Gonzella Lex, MD   1 mg at 07/04/16 1317   Or  . LORazepam (ATIVAN) injection 1 mg  1 mg Intravenous Q6H PRN Gonzella Lex, MD      . multivitamin with minerals tablet 1 tablet  1 tablet Oral Daily Gonzella Lex, MD   1 tablet at 07/04/16 0947  . propranolol (INDERAL) tablet 10 mg  10 mg Oral TID Gonzella Lex, MD   10 mg at 07/04/16 1533  . thiamine (VITAMIN B-1) tablet 100 mg  100 mg Oral Daily Gonzella Lex, MD   100 mg at 07/04/16 0947   Or  . thiamine (B-1) injection 100 mg  100 mg Intravenous Daily Gonzella Lex, MD      . venlafaxine XR (EFFEXOR-XR) 24 hr capsule 150 mg  150 mg Oral Q breakfast Gonzella Lex, MD   150 mg at 07/04/16 [id number]   Current Outpatient Prescriptions  Medication Sig Dispense Refill  . chlordiazePOXIDE (LIBRIUM) 10 MG capsule Take 1 capsule (10 mg total) by mouth 3 (three) times daily as needed for anxiety or withdrawal. 6 capsule 0  . gabapentin (NEURONTIN) 300 MG capsule Take 3 capsules (900 mg total) by mouth 3 (three) times daily with meals. (Patient taking differently: Take 600 mg by mouth 3 (three) times daily with meals. ) 270 capsule 0  . HYDROcodone-acetaminophen (NORCO) 5-325 MG tablet Take 1 tablet by mouth every 6 (six) hours as needed for moderate pain. 30 tablet 0  . ondansetron (ZOFRAN ODT) 4 MG disintegrating tablet Take 1 tablet (4 mg total) by mouth every 8 (eight) hours as needed for nausea or vomiting. 20 tablet 0  . propranolol (INDERAL) 10 MG tablet Take 1 tablet (10 mg total) by mouth 3 (three) times daily with meals. 90 tablet 0  . tiZANidine (ZANAFLEX) 4 MG tablet Take 1 tablet (4 mg total) by mouth every 6 (six) hours as needed for muscle spasms. 30 tablet 0  . traZODone (DESYREL) 150 MG tablet Take 1 tablet (150 mg total) by mouth at bedtime. (Patient taking differently: Take 150-300 mg by mouth at bedtime as  needed. ) 30 tablet 0  . venlafaxine XR (EFFEXOR-XR) 150 MG 24 hr capsule Take 2 capsules (300 mg total) by mouth daily with breakfast. 60 capsule 0    Musculoskeletal: Strength & Muscle Tone: decreased Gait & Station: broad based Patient leans: N/A  Psychiatric Specialty Exam: Physical Exam  Nursing note and vitals reviewed. Constitutional: She appears well-developed. She appears toxic. She appears distressed.  HENT:  Head: Normocephalic and atraumatic.  Eyes: Conjunctivae are normal. Pupils are equal, round, and reactive to light.  Neck: Normal range of motion.  Cardiovascular: Regular rhythm and normal heart sounds.   Respiratory: Effort normal. No respiratory distress.  GI: Soft.  Musculoskeletal: Normal range of motion.  Neurological: She is alert.  Skin: Skin is warm. She is diaphoretic.  Psychiatric: Her mood appears anxious. Her speech is delayed and slurred. She is slowed and withdrawn. Cognition and memory are impaired. She expresses impulsivity. She exhibits a depressed mood. She expresses suicidal ideation. She expresses suicidal plans. She exhibits abnormal recent memory.    Review of Systems  HENT: Negative.   Eyes: Negative.  Respiratory: Negative.   Cardiovascular: Negative.   Gastrointestinal: Negative.   Musculoskeletal: Negative.   Skin: Negative.   Neurological: Positive for dizziness, tremors and weakness.  Psychiatric/Behavioral: Positive for depression, substance abuse and suicidal ideas. Negative for hallucinations and memory loss. The patient is nervous/anxious and has insomnia.     Blood pressure (!) 114/47, pulse 81, temperature 98.3 F (36.8 C), temperature source Oral, resp. rate 18, height 5\' 5"  (1.651 m), weight 65.8 kg (145 lb), SpO2 97 %.Body mass index is 24.13 kg/m.  General Appearance: Disheveled  Eye Contact:  Fair  Speech:  Slow and Slurred  Volume:  Decreased  Mood:  Depressed and Dysphoric  Affect:  Depressed and Tearful  Thought  Process:  Disorganized  Orientation:  Full (Time, Place, and Person)  Thought Content:  Tangential  Suicidal Thoughts:  Yes.  with intent/plan  Homicidal Thoughts:  No  Memory:  Immediate;   Good Recent;   Fair Remote;   Fair  Judgement:  Impaired  Insight:  Fair  Psychomotor Activity:  Decreased and Tremor  Concentration:  Concentration: Fair  Recall:  [employer] of Knowledge:  Fair  Language:  Fair  Akathisia:  No  Handed:  Right  AIMS (if indicated):     Assets:  Communication Skills Desire for Improvement Housing Resilience  ADL's:  Impaired  Cognition:  Impaired,  Mild  Sleep:        Treatment Plan Summary: Daily contact with patient to assess and evaluate symptoms and progress in treatment, Medication management and Plan Patient has orders for admission to the psychiatry ward. I have been told that she is likely to be able to come onto the unit today. No change to medication or treatment plan at this point.  Disposition: Recommend psychiatric Inpatient admission when medically cleared. Supportive therapy provided about ongoing stressors.  Alethia Berthold, MD 07/04/2016 6:22 PM

## 2016-07-05 ENCOUNTER — Inpatient Hospital Stay
Admission: RE | Admit: 2016-07-05 | Discharge: 2016-07-11 | DRG: 885 | Disposition: A | Discharge status: Home / Self Care | Payer: Medicare Other | Attending: Psychiatry | Admitting: Psychiatry

## 2016-07-05 DIAGNOSIS — R011 Cardiac murmur, unspecified: Secondary | ICD-10-CM | POA: Diagnosis present

## 2016-07-05 DIAGNOSIS — J449 Chronic obstructive pulmonary disease, unspecified: Secondary | ICD-10-CM | POA: Diagnosis present

## 2016-07-05 DIAGNOSIS — F17210 Nicotine dependence, cigarettes, uncomplicated: Secondary | ICD-10-CM | POA: Diagnosis present

## 2016-07-05 DIAGNOSIS — R197 Diarrhea, unspecified: Secondary | ICD-10-CM | POA: Diagnosis not present

## 2016-07-05 DIAGNOSIS — F10239 Alcohol dependence with withdrawal, unspecified: Secondary | ICD-10-CM | POA: Diagnosis present

## 2016-07-05 DIAGNOSIS — Z881 Allergy status to other antibiotic agents status: Secondary | ICD-10-CM | POA: Diagnosis not present

## 2016-07-05 DIAGNOSIS — F4310 Post-traumatic stress disorder, unspecified: Secondary | ICD-10-CM | POA: Diagnosis present

## 2016-07-05 DIAGNOSIS — G8929 Other chronic pain: Secondary | ICD-10-CM | POA: Diagnosis present

## 2016-07-05 DIAGNOSIS — Z888 Allergy status to other drugs, medicaments and biological substances status: Secondary | ICD-10-CM | POA: Diagnosis not present

## 2016-07-05 DIAGNOSIS — Z825 Family history of asthma and other chronic lower respiratory diseases: Secondary | ICD-10-CM | POA: Diagnosis not present

## 2016-07-05 DIAGNOSIS — F411 Generalized anxiety disorder: Secondary | ICD-10-CM | POA: Diagnosis present

## 2016-07-05 DIAGNOSIS — Z811 Family history of alcohol abuse and dependence: Secondary | ICD-10-CM

## 2016-07-05 DIAGNOSIS — F332 Major depressive disorder, recurrent severe without psychotic features: Principal | ICD-10-CM | POA: Diagnosis present

## 2016-07-05 DIAGNOSIS — S48112A Complete traumatic amputation at level between left shoulder and elbow, initial encounter: Secondary | ICD-10-CM

## 2016-07-05 DIAGNOSIS — Z818 Family history of other mental and behavioral disorders: Secondary | ICD-10-CM | POA: Diagnosis not present

## 2016-07-05 DIAGNOSIS — F3164 Bipolar disorder, current episode mixed, severe, with psychotic features: Secondary | ICD-10-CM

## 2016-07-05 DIAGNOSIS — Z885 Allergy status to narcotic agent status: Secondary | ICD-10-CM | POA: Diagnosis not present

## 2016-07-05 DIAGNOSIS — Z96643 Presence of artificial hip joint, bilateral: Secondary | ICD-10-CM | POA: Diagnosis present

## 2016-07-05 DIAGNOSIS — T43201A Poisoning by unspecified antidepressants, accidental (unintentional), initial encounter: Secondary | ICD-10-CM | POA: Diagnosis present

## 2016-07-05 DIAGNOSIS — Z915 Personal history of self-harm: Secondary | ICD-10-CM

## 2016-07-05 DIAGNOSIS — Z23 Encounter for immunization: Secondary | ICD-10-CM | POA: Diagnosis not present

## 2016-07-05 DIAGNOSIS — F10939 Alcohol use, unspecified with withdrawal, unspecified: Secondary | ICD-10-CM | POA: Diagnosis present

## 2016-07-05 DIAGNOSIS — Z86018 Personal history of other benign neoplasm: Secondary | ICD-10-CM | POA: Diagnosis not present

## 2016-07-05 DIAGNOSIS — G4700 Insomnia, unspecified: Secondary | ICD-10-CM | POA: Diagnosis present

## 2016-07-05 DIAGNOSIS — F1020 Alcohol dependence, uncomplicated: Secondary | ICD-10-CM | POA: Diagnosis present

## 2016-07-05 DIAGNOSIS — T43211A Poisoning by selective serotonin and norepinephrine reuptake inhibitors, accidental (unintentional), initial encounter: Secondary | ICD-10-CM | POA: Diagnosis not present

## 2016-07-05 DIAGNOSIS — R45851 Suicidal ideations: Secondary | ICD-10-CM | POA: Diagnosis present

## 2016-07-05 DIAGNOSIS — G629 Polyneuropathy, unspecified: Secondary | ICD-10-CM | POA: Diagnosis present

## 2016-07-05 DIAGNOSIS — Z89222 Acquired absence of left upper limb above elbow: Secondary | ICD-10-CM | POA: Diagnosis not present

## 2016-07-05 LAB — LIPID PANEL
CHOL/HDL RATIO: 4.1 ratio
Cholesterol: 215 mg/dL — ABNORMAL HIGH (ref 0–200)
HDL: 53 mg/dL (ref >40)
LDL Cholesterol: 136 mg/dL — ABNORMAL HIGH (ref 0–99)
TRIGLYCERIDES: 131 mg/dL (ref <150)
VLDL: 26 mg/dL (ref 0–40)

## 2016-07-05 LAB — TSH: TSH: 0.939 u[IU]/mL (ref 0.350–4.500)

## 2016-07-05 MED ORDER — LORAZEPAM 2 MG/ML IJ SOLN: 1.0000 mg | Freq: Four times a day (QID) | INTRAMUSCULAR | Status: AC | PRN

## 2016-07-05 MED ORDER — ADULT MULTIVITAMIN W/MINERALS CH
1.0000 | ORAL_TABLET | Freq: Every day | ORAL | Status: DC
  Administered 2016-07-05 – 2016-07-11 (×7): 1 via ORAL
  Filled 2016-07-05 (×7): qty 1

## 2016-07-05 MED ORDER — TRAZODONE HCL 100 MG PO TABS
200.0000 mg | ORAL_TABLET | Freq: Every day | ORAL | Status: DC
  Administered 2016-07-05 – 2016-07-10 (×6): 200 mg via ORAL
  Filled 2016-07-05 (×7): qty 2

## 2016-07-05 MED ORDER — CHLORDIAZEPOXIDE HCL 25 MG PO CAPS
25.0000 mg | ORAL_CAPSULE | Freq: Three times a day (TID) | ORAL | Status: DC
Start: 2016-07-05 — End: 2016-07-05

## 2016-07-05 MED ORDER — THIAMINE HCL 100 MG/ML IJ SOLN: 100.0000 mg | Freq: Every day | INTRAMUSCULAR | Status: DC

## 2016-07-05 MED ORDER — FOLIC ACID 1 MG PO TABS
1.0000 mg | ORAL_TABLET | Freq: Every day | ORAL | Status: DC
  Administered 2016-07-05 – 2016-07-11 (×7): 1 mg via ORAL
  Filled 2016-07-05 (×7): qty 1

## 2016-07-05 MED ORDER — LOPERAMIDE HCL 2 MG PO CAPS: 2.0000 mg | ORAL_CAPSULE | ORAL | Status: DC | PRN

## 2016-07-05 MED ORDER — VITAMIN B-1 100 MG PO TABS
100.0000 mg | ORAL_TABLET | Freq: Every day | ORAL | Status: DC
  Administered 2016-07-05 – 2016-07-11 (×7): 100 mg via ORAL
  Filled 2016-07-05 (×7): qty 1

## 2016-07-05 MED ORDER — ACETAMINOPHEN 325 MG PO TABS
650.0000 mg | ORAL_TABLET | Freq: Four times a day (QID) | ORAL | Status: DC | PRN
Start: 2016-07-05 — End: 2016-07-11
  Administered 2016-07-05 – 2016-07-06 (×2): 650 mg via ORAL
  Filled 2016-07-05 (×2): qty 2

## 2016-07-05 MED ORDER — CHLORDIAZEPOXIDE HCL 25 MG PO CAPS
25.0000 mg | ORAL_CAPSULE | Freq: Three times a day (TID) | ORAL | Status: AC
  Administered 2016-07-05 – 2016-07-07 (×5): 25 mg via ORAL
  Filled 2016-07-05 (×5): qty 1

## 2016-07-05 MED ORDER — ALUM & MAG HYDROXIDE-SIMETH 200-200-20 MG/5ML PO SUSP: 30.0000 mL | ORAL | Status: DC | PRN

## 2016-07-05 MED ORDER — MAGNESIUM HYDROXIDE 400 MG/5ML PO SUSP
30.0000 mL | Freq: Every day | ORAL | Status: DC | PRN
  Administered 2016-07-09 – 2016-07-10 (×2): 30 mL via ORAL
  Filled 2016-07-05 (×2): qty 30

## 2016-07-05 MED ORDER — PROPRANOLOL HCL 10 MG PO TABS
10.0000 mg | ORAL_TABLET | Freq: Three times a day (TID) | ORAL | Status: DC
Start: 2016-07-05 — End: 2016-07-11
  Administered 2016-07-05 – 2016-07-11 (×19): 10 mg via ORAL
  Filled 2016-07-05 (×21): qty 1

## 2016-07-05 MED ORDER — CHLORDIAZEPOXIDE HCL 25 MG PO CAPS
50.0000 mg | ORAL_CAPSULE | ORAL | Status: AC
  Administered 2016-07-05: 50 mg via ORAL
  Filled 2016-07-05: qty 2

## 2016-07-05 MED ORDER — CYCLOBENZAPRINE HCL 10 MG PO TABS
5.0000 mg | ORAL_TABLET | Freq: Three times a day (TID) | ORAL | Status: DC | PRN
Start: 2016-07-05 — End: 2016-07-08
  Administered 2016-07-05 – 2016-07-08 (×6): 5 mg via ORAL
  Filled 2016-07-05 (×4): qty 1
  Filled 2016-07-05: qty 2
  Filled 2016-07-05 (×2): qty 1

## 2016-07-05 MED ORDER — LORAZEPAM 1 MG PO TABS
1.0000 mg | ORAL_TABLET | Freq: Four times a day (QID) | ORAL | Status: AC | PRN
Start: 2016-07-05 — End: 2016-07-08
  Administered 2016-07-05 – 2016-07-06 (×2): 1 mg via ORAL
  Filled 2016-07-05 (×2): qty 1

## 2016-07-05 MED ORDER — VENLAFAXINE HCL ER 75 MG PO CP24
150.0000 mg | ORAL_CAPSULE | Freq: Every day | ORAL | Status: DC
  Administered 2016-07-05 – 2016-07-11 (×7): 150 mg via ORAL
  Filled 2016-07-05 (×7): qty 2

## 2016-07-05 MED ORDER — GABAPENTIN 300 MG PO CAPS
900.0000 mg | ORAL_CAPSULE | Freq: Three times a day (TID) | ORAL | Status: DC
  Administered 2016-07-05 – 2016-07-11 (×20): 900 mg via ORAL
  Filled 2016-07-05 (×20): qty 3

## 2016-07-05 NOTE — Progress Notes (Signed)
CONCERNING: IV to Oral Route Change Policy  RECOMMENDATION: This patient is receiving thiamine by the intravenous route.  Based on criteria approved by the Pharmacy and Therapeutics Committee, the intravenous medication(s) is/are being converted to the equivalent oral dose form(s).   DESCRIPTION: These criteria include:  The patient is eating (either orally or via tube) and/or has been taking other orally administered medications for a least 24 hours  The patient has no evidence of active gastrointestinal bleeding or impaired GI absorption (gastrectomy, short bowel, patient on TNA or NPO).  If you have questions about this conversion, please contact the Pharmacy Department  []   [phone] )  [name] [x]   [phone] )  [name] []   [phone] )  [name] []   [phone] )  [name] []   [phone] )  [name], [name] 07/05/2016 2:16 PM

## 2016-07-05 NOTE — H&P (Signed)
Psychiatric Admission Assessment Adult  Patient Identification: [name] MRN:  [id number] Date of Evaluation:  07/05/2016 Chief Complaint:  major depression Principal Diagnosis: Severe recurrent major depression without psychotic features ([place]) Diagnosis:   Patient Active Problem List   Diagnosis Date Noted  . Overdose of antidepressant [T43.201A] 07/03/2016  . 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 Severe recurrent major depression without psychotic features ([place]) [F33.2] 07/03/2015  . Alcohol use disorder, severe, dependence ([place]) [F10.20] 01/26/2015  . Tobacco use disorder [F17.200] 01/26/2015  . Alcohol withdrawal ([place]) [F10.239] 01/08/2015  . 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 GAD (generalized anxiety disorder) [F41.1] 12/24/2013  . PTSD (post-traumatic stress disorder) [F43.10] 12/22/2013   History of Present Illness: 61 year old woman with a history of depression and alcohol abuse presented to the emergency room after taking an intentional overdose of trazodone. Also has been drinking heavily recently.. Mood has been more depressed. She has had suicidal thoughts and hopelessness. Currently the patient is still having tremulousness dizziness and nausea and is complaining of some diarrhea today. Mood is dysphoric and anxious without acute suicidal intent. Sleeping poorly. Associated Signs/Symptoms: Depression Symptoms:  depressed mood, anhedonia, insomnia, psychomotor retardation, feelings of worthlessness/guilt, recurrent thoughts of death, suicidal thoughts with specific plan, (Hypo) Manic Symptoms:  None Anxiety Symptoms:  Excessive Worry, Psychotic Symptoms:  None PTSD Symptoms: Had a traumatic exposure:  History of abuse in the past Re-experiencing:  Intrusive Thoughts Hypervigilance:  Yes Total Time spent with patient: 45 minutes  Past Psychiatric History: Patient has a long history of depression and alcohol  abuse and posttraumatic stress disorder. Has been able to maintain sobriety for extended periods of time recently relapsed. Holidays tend to be difficult for her. She has a history of some response to antidepressants but also recurrent suicide attempts. Patient has a history of alcohol withdrawal problems but not full delirium tremens. She has an outpatient provider she has followed up with.  Is the patient at risk to self? Yes.    Has the patient been a risk to self in the past 6 months? Yes.    Has the patient been a risk to self within the distant past? Yes.    Is the patient a risk to others? No.  Has the patient been a risk to others in the past 6 months? No.  Has the patient been a risk to others within the distant past? No.   Prior Inpatient Therapy:   Prior Outpatient Therapy:    Alcohol Screening: 1. How often do you have a drink containing alcohol?: 4 or more times a week 2. How many drinks containing alcohol do you have on a typical day when you are drinking?: 10 or more 3. How often do you have six or more drinks on one occasion?: Daily or almost daily Preliminary Score: 8 4. How often during the last year have you found that you were not able to stop drinking once you had started?: Daily or almost daily 5. How often during the last year have you failed to do what was normally expected from you becasue of drinking?: Weekly 6. How often during the last year have you needed a first drink in the morning to get yourself going after a heavy drinking session?: Daily or almost daily 7. How often during the last year have you had a feeling of guilt of remorse after drinking?: Daily or almost daily 8. How often during the last year have you been  unable to remember what happened the night before because you had been drinking?: Weekly 9. Have you or someone else been injured as a result of your drinking?: No 10. Has a relative or friend or a doctor or another health worker been concerned about  your drinking or suggested you cut down?: No Alcohol Use Disorder Identification Test Final Score (AUDIT): 30 Brief Intervention: Yes Substance Abuse History in the last 12 months:  Yes.   Consequences of Substance Abuse: Medical Consequences:  Patient is having weight loss memory impairment and progressive malnutrition and GI problems from alcohol abuse Withdrawal Symptoms:   Cramps Diarrhea Nausea Tremors Previous Psychotropic Medications: Yes  Psychological Evaluations: Yes  Past Medical History:  Past Medical History:  Diagnosis Date  . Anxiety   . Anxiety    panic anxiety disorder  . Arthritis   . Asthma    chronic asthmatic bronchitis  . Cancer [facility])    Desmoid tumor left forearm  . Depression   . ETOH abuse   . Heart murmur   . Insomnia   . Neuropathy [name])     Past Surgical History:  Procedure Laterality Date  . ABDOMINAL HYSTERECTOMY    . ABDOMINAL HYSTERECTOMY  2007  . 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 ARM AMPUTATION AT SHOULDER Left   . CESAREAN SECTION    . [place]   x 2  . JOINT REPLACEMENT Right 2012   hip  . LUMBAR LAMINECTOMY/DECOMPRESSION MICRODISCECTOMY Left 04/16/2016   Procedure: LUMBAR LAMINECTOMY/DECOMPRESSION MICRODISCECTOMY 1 LEVEL;  Surgeon: Meade Maw, MD;  Location: [facility] ORS;  Service: Neurosurgery;  Laterality: Left;  Left L4-5 far lateral discectomy, left L4-5 laminoforaminotomy  . TOTAL HIP ARTHROPLASTY Left 09/17/2015   Procedure: TOTAL HIP ARTHROPLASTY;  Surgeon: Dereck Leep, MD;  Location: [facility] ORS;  Service: Orthopedics;  Laterality: Left;   Family History:  Family History  Problem Relation Age of Onset  . COPD Mother   . Alcoholism Father   . COPD Father    Family Psychiatric  History: Patient has a family history of alcohol abuse as well as anxiety and depression Tobacco Screening: Have you used any form of tobacco in the last 30 days? (Cigarettes, Smokeless Tobacco,  Cigars, and/or Pipes): Yes Tobacco use, Select all that apply: 5 or more cigarettes per day Are you interested in Tobacco Cessation Medications?: No, patient refused Counseled patient on smoking cessation including recognizing danger situations, developing coping skills and basic information about quitting provided: Refused/Declined practical counseling Social History:  History  Alcohol Use  . Yes    Comment: 12 pack beer per day for last week     History  Drug Use No    Comment: Patient denies     Additional Social History:                           Allergies:   Allergies  Allergen Reactions  . Benadryl [Diphenhydramine] Hives and Other (See Comments)    Reaction:  Hyperactivity   . Benadryl [Diphenhydramine] Other (See Comments)    My jaw locked and I could not speak, like a stroke.  . Cephalosporins Hives  . Codeine Nausea And Vomiting  . Codeine Nausea Only    severe   Lab Results:  Results for orders placed or performed during the hospital encounter of 07/05/16 (from the past 48 hour(s))  Lipid panel  Status: Abnormal   Collection Time: 07/05/16  6:35 AM  Result Value Ref Range   Cholesterol 215 (H) 0 - 200 mg/dL   Triglycerides 131 <150 mg/dL   HDL 53 >40 mg/dL   Total CHOL/HDL Ratio 4.1 RATIO   VLDL 26 0 - 40 mg/dL   LDL Cholesterol 136 (H) 0 - 99 mg/dL    Comment:        Total Cholesterol/HDL:CHD Risk Coronary Heart Disease Risk Table                     Men   Women  1/2 Average Risk   3.4   3.3  Average Risk       5.0   4.4  2 X Average Risk   9.6   7.1  3 X Average Risk  23.4   11.0        Use the calculated Patient Ratio above and the CHD Risk Table to determine the patient's CHD Risk.        ATP III CLASSIFICATION (LDL):  <100     mg/dL   Optimal  100-129  mg/dL   Near or Above                    Optimal  130-159  mg/dL   Borderline  160-189  mg/dL   High  >190     mg/dL   Very High   TSH     Status: None   Collection Time:  07/05/16  6:35 AM  Result Value Ref Range   TSH 0.939 0.350 - 4.500 uIU/mL    Comment: Performed by a 3rd Generation assay with a functional sensitivity of <=0.01 uIU/mL.    Blood Alcohol level:  Lab Results  Component Value Date   ETH 49 (H) 07/02/2016   ETH <5 [id number]    Metabolic Disorder Labs:  Lab Results  Component Value Date   HGBA1C 5.2 06/30/2015   No results found for: PROLACTIN Lab Results  Component Value Date   CHOL 215 (H) 07/05/2016   TRIG 131 07/05/2016   HDL 53 07/05/2016   CHOLHDL 4.1 07/05/2016   VLDL 26 07/05/2016   LDLCALC 136 (H) 07/05/2016   LDLCALC 116 (H) 07/05/2015    Current Medications: Current Facility-Administered Medications  Medication Dose Route Frequency Provider Last Rate Last Dose  . acetaminophen (TYLENOL) tablet 650 mg  650 mg Oral Q6H PRN Gonzella Lex, MD      . alum & mag hydroxide-simeth (MAALOX/MYLANTA) 200-200-20 MG/5ML suspension 30 mL  30 mL Oral Q4H PRN Gonzella Lex, MD      . chlordiazePOXIDE (LIBRIUM) capsule 25 mg  25 mg Oral TID Gonzella Lex, MD      . cyclobenzaprine (FLEXERIL) tablet 5 mg  5 mg Oral TID PRN Gonzella Lex, MD      . folic acid (FOLVITE) tablet 1 mg  1 mg Oral Daily Gonzella Lex, MD   1 mg at 07/05/16 0813  . gabapentin (NEURONTIN) capsule 900 mg  900 mg Oral TID Gonzella Lex, MD   900 mg at 07/05/16 1205  . loperamide (IMODIUM) capsule 2 mg  2 mg Oral PRN Gonzella Lex, MD      . LORazepam (ATIVAN) tablet 1 mg  1 mg Oral Q6H PRN Gonzella Lex, MD   1 mg at 07/05/16 0910   Or  . LORazepam (ATIVAN) injection 1 mg  1 mg Intravenous Q6H  PRN Gonzella Lex, MD      . magnesium hydroxide (MILK OF MAGNESIA) suspension 30 mL  30 mL Oral Daily PRN Gonzella Lex, MD      . multivitamin with minerals tablet 1 tablet  1 tablet Oral Daily Gonzella Lex, MD   1 tablet at 07/05/16 [phone]  . propranolol (INDERAL) tablet 10 mg  10 mg Oral TID Gonzella Lex, MD   10 mg at 07/05/16 1315  . thiamine  (VITAMIN B-1) tablet 100 mg  100 mg Oral Daily Gonzella Lex, MD   100 mg at 07/05/16 0813   Or  . thiamine (B-1) injection 100 mg  100 mg Intravenous Daily Gonzella Lex, MD      . venlafaxine XR (EFFEXOR-XR) 24 hr capsule 150 mg  150 mg Oral Q breakfast Gonzella Lex, MD   150 mg at 07/05/16 0813   PTA Medications: Prescriptions Prior to Admission  Medication Sig Dispense Refill Last Dose  . chlordiazePOXIDE (LIBRIUM) 10 MG capsule Take 1 capsule (10 mg total) by mouth 3 (three) times daily as needed for anxiety or withdrawal. 6 capsule 0 unknown at unknown  . gabapentin (NEURONTIN) 300 MG capsule Take 3 capsules (900 mg total) by mouth 3 (three) times daily with meals. (Patient taking differently: Take 600 mg by mouth 3 (three) times daily with meals. ) 270 capsule 0 unknown at unknown  . HYDROcodone-acetaminophen (NORCO) 5-325 MG tablet Take 1 tablet by mouth every 6 (six) hours as needed for moderate pain. 30 tablet 0 unknown at unknown  . ondansetron (ZOFRAN ODT) 4 MG disintegrating tablet Take 1 tablet (4 mg total) by mouth every 8 (eight) hours as needed for nausea or vomiting. 20 tablet 0 unknown at unknown  . propranolol (INDERAL) 10 MG tablet Take 1 tablet (10 mg total) by mouth 3 (three) times daily with meals. 90 tablet 0 unknown at unknown  . tiZANidine (ZANAFLEX) 4 MG tablet Take 1 tablet (4 mg total) by mouth every 6 (six) hours as needed for muscle spasms. 30 tablet 0 unknown at unknown  . traZODone (DESYREL) 150 MG tablet Take 1 tablet (150 mg total) by mouth at bedtime. (Patient taking differently: Take 150-300 mg by mouth at bedtime as needed. ) 30 tablet 0 unknown at unknown  . venlafaxine XR (EFFEXOR-XR) 150 MG 24 hr capsule Take 2 capsules (300 mg total) by mouth daily with breakfast. 60 capsule 0 unknown at unknown    Musculoskeletal: Strength & Muscle Tone: decreased Gait & Station: broad based Patient leans: N/A  Psychiatric Specialty Exam: Physical Exam  Nursing  note and vitals reviewed. Constitutional: She appears well-developed and well-nourished. She appears distressed.  HENT:  Head: Normocephalic and atraumatic.  Eyes: Conjunctivae are normal. Pupils are equal, round, and reactive to light.  Neck: Normal range of motion.  Cardiovascular: Regular rhythm and normal heart sounds.   Respiratory: Effort normal. No respiratory distress.  GI: Soft.  Musculoskeletal: Normal range of motion.       Arms: Neurological: She is alert.  Skin: Skin is warm. She is diaphoretic.  Psychiatric: Judgment normal. Her affect is blunt. Her speech is delayed. She is slowed and withdrawn. Cognition and memory are normal. She expresses no suicidal ideation.    Review of Systems  Constitutional: Positive for malaise/fatigue.  HENT: Negative.   Eyes: Negative.   Respiratory: Negative.   Cardiovascular: Negative.   Gastrointestinal: Positive for abdominal pain and diarrhea.  Musculoskeletal: Negative.   Skin:  Negative.   Neurological: Positive for tremors and weakness.  Psychiatric/Behavioral: Positive for depression, memory loss and substance abuse. Negative for hallucinations and suicidal ideas. The patient is nervous/anxious and has insomnia.     Blood pressure (!) 151/71, pulse 85, temperature 98 F (36.7 C), temperature source Oral, resp. rate 18, height 5\' 5"  (1.651 m), weight 65.8 kg (145 lb), SpO2 99 %.Body mass index is 24.13 kg/m.  General Appearance: Disheveled  Eye Contact:  Fair  Speech:  Slow  Volume:  Decreased  Mood:  Dysphoric  Affect:  Congruent  Thought Process:  Goal Directed  Orientation:  Full (Time, Place, and Person)  Thought Content:  Logical  Suicidal Thoughts:  No  Homicidal Thoughts:  No  Memory:  Immediate;   Good Recent;   Fair Remote;   Fair  Judgement:  Fair  Insight:  Fair  Psychomotor Activity:  Decreased  Concentration:  Concentration: Good  Recall:  Good  Fund of Knowledge:  Good  Language:  Good  Akathisia:  No    Handed:  Right  AIMS (if indicated):     Assets:  Communication Skills Desire for Improvement Housing Resilience Social Support  ADL's:  Intact  Cognition:  WNL  Sleep:  Number of Hours: 0.45    Treatment Plan Summary: Daily contact with patient to assess and evaluate symptoms and progress in treatment, Medication management and Plan Patient admitted to the psychiatric ward. She is still having withdrawal. Reviewed with nursing the protocol and we agree that the current level of medication and the CIWA protocol is not adequate. I have added standing doses of Librium for the next day and a half. Patient is complaining of diarrhea. I have added Imodium but advised her to reserve it for worsening bowel movements in order to avoid constipation and oversedation. She is back on her antidepressant medicine and will be included in groups and activities on the unit.  Observation Level/Precautions:  15 minute checks  Laboratory:  Chemistry Profile  Psychotherapy:    Medications:    Consultations:    Discharge Concerns:    Estimated LOS:  Other:     Physician Treatment Plan for Primary Diagnosis: Severe recurrent major depression without psychotic features ([place]) Long Term Goal(s): Improvement in symptoms so as ready for discharge  Short Term Goals: Ability to verbalize feelings will improve and Ability to disclose and discuss suicidal ideas  Physician Treatment Plan for Secondary Diagnosis: Principal Problem:   Severe recurrent major depression without psychotic features ([place]) Active Problems:   PTSD (post-traumatic stress disorder)   GAD (generalized anxiety disorder)   Alcohol withdrawal ([place])   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    Alethia Berthold, MD 1/13/20181:51 PM

## 2016-07-05 NOTE — BHH Group Notes (Signed)
[facility] LCSW Group Therapy  07/05/2016 1pm  Type of Therapy:  Group Therapy  Participation Level:  Active  Participation Quality:  Appropriate, Sharing and Supportive  Affect:  Appropriate  Cognitive:  Oriented  Insight:  Developing/Improving  Engagement in Therapy:  Engaged  Modes of Intervention:  Clarification, Confrontation, Discussion, Exploration, Socialization and Support  Summary of Progress The topic for today was feelings related to overcoming obstacles. Patient were asked to introduce himself or herself and reflect an obstacle they face. In group peers supported  each other and shared ideas on how to resolve these obstacles. This patient was able to reflect that doctors not listening is a big obstacle for her. Peers suggested she could request new doctor and was empowered by the many people who supported her.  Pauletta Browns, Nazyia Gaugh M LCSW 07/05/2016, 4pm

## 2016-07-05 NOTE — BHH Suicide Risk Assessment (Signed)
[facility] Admission Suicide Risk Assessment   Nursing information obtained from:   discussion with nursing on the unit as well as review of chart Demographic factors:   female. Living alone. History of traumatic loss. Drinking. Current Mental Status:   blunted and withdrawn. Mildly depressed. Denies suicidal thoughts. No evidence of psychosis. She is returning to her cognitive functioning baseline Loss Factors:   history of traumatic events in the past as well as anniversary of losses in her life and financial problems Historical Factors:   past history of trauma Risk Reduction Factors:   patient has some friends for support and has a Risk manager with appropriate treatment options in the community  Total Time spent with patient: 1 hour Principal Problem: Severe recurrent major depression without psychotic features ([place]) Diagnosis:   Patient Active Problem List   Diagnosis Date Noted  . Overdose of antidepressant [T43.201A] 07/03/2016  . 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 Severe recurrent major depression without psychotic features ([place]) [F33.2] 07/03/2015  . Alcohol use disorder, severe, dependence ([place]) [F10.20] 01/26/2015  . Tobacco use disorder [F17.200] 01/26/2015  . Alcohol withdrawal ([place]) [F10.239] 01/08/2015  . 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 GAD (generalized anxiety disorder) [F41.1] 12/24/2013  . PTSD (post-traumatic stress disorder) [F43.10] 12/22/2013   Subjective Data: Patient is continuing to report mild depression but denies suicidal intent. Physical symptoms consistent with alcohol withdrawal including tremulousness nausea diarrhea fatigue difficulty sleeping  Continued Clinical Symptoms:  Alcohol Use Disorder Identification Test Final Score (AUDIT): 30 The "Alcohol Use Disorders Identification Test", Guidelines for Use in Primary Care, Second Edition.  World Pharmacologist [facility]). Score between 0-7:  no or low  risk or alcohol related problems. Score between 8-15:  moderate risk of alcohol related problems. Score between 16-19:  high risk of alcohol related problems. Score 20 or above:  warrants further diagnostic evaluation for alcohol dependence and treatment.   CLINICAL FACTORS:   Depression:   Anhedonia Comorbid alcohol abuse/dependence Hopelessness Alcohol/Substance Abuse/Dependencies   Musculoskeletal: Strength & Muscle Tone: decreased Gait & Station: broad based Patient leans: N/A  Psychiatric Specialty Exam: Physical Exam  ROS  Blood pressure (!) 151/71, pulse 85, temperature 98 F (36.7 C), temperature source Oral, resp. rate 18, height 5\' 5"  (1.651 m), weight 65.8 kg (145 lb), SpO2 99 %.Body mass index is 24.13 kg/m.  General Appearance: Disheveled  Eye Contact:  Good  Speech:  Slow  Volume:  Decreased  Mood:  Dysphoric  Affect:  Congruent  Thought Process:  Goal Directed  Orientation:  Full (Time, Place, and Person)  Thought Content:  Logical  Suicidal Thoughts:  No  Homicidal Thoughts:  No  Memory:  Immediate;   Good Recent;   Fair Remote;   Fair  Judgement:  Fair  Insight:  Fair  Psychomotor Activity:  Tremor  Concentration:  Concentration: Fair  Recall:  [employer] of Knowledge:  Fair  Language:  Fair  Akathisia:  No  Handed:  Right  AIMS (if indicated):     Assets:  Communication Skills Desire for Improvement Housing Resilience Social Support  ADL's:  Intact  Cognition:  WNL  Sleep:  Number of Hours: 0.45      COGNITIVE FEATURES THAT CONTRIBUTE TO RISK:  Loss of executive function    SUICIDE RISK:   Mild:  Suicidal ideation of limited frequency, intensity, duration, and specificity.  There are no identifiable plans, no associated intent, mild dysphoria and related symptoms, good  self-control (both objective and subjective assessment), few other risk factors, and identifiable protective factors, including available and accessible social  support.   PLAN OF CARE: Patient has been admitted to the psychiatric unit. She is being treated for alcohol withdrawal with appropriate detox medicine and monitoring. She is being treated with depression by continuing on her antidepressant medicine. I am going to restart some of her trazodone tonight as it has been several days since her presentation and she is still having trouble sleeping. Notes are all reviewed including her EKG which is not showing concerning significant changes. Labs reviewed. Patient will be included in groups on the unit.  I certify that inpatient services furnished can reasonably be expected to improve the patient's condition.  Alethia Berthold, MD 07/05/2016, 1:56 PM

## 2016-07-05 NOTE — Progress Notes (Signed)
D: Patient very anxious this am shift . Patient had loose stoods this am shift  Informed MD about her concern . Additional medication given towards her withdrawal.  Patient able  To  Shower this am shift .  Interaction with peers and staff. Attended [place] . Patient stated sleptpoor last night .Stated appetite ispoor and energy level  Ilow. Stated concentration is poor . Stated on Depression scale , hopeless and anxiety .( low 0-10 high) Denies suicidal  homicidal ideations  .  No auditory hallucinations  No pain concerns . Appropriate ADL'S. Interacting with peers and staff.  A: Encourage patient participation with unit programming . Instruction  Given on  Medication , verbalize understanding. R: Voice no other concerns. Staff continue to monitor

## 2016-07-05 NOTE — Tx Team (Signed)
Initial Treatment Plan 07/05/2016 5:06 AM [name] [id number]    PATIENT STRESSORS: Substance abuse   PATIENT STRENGTHS: Capable of independent living Communication skills Motivation for treatment/growth Supportive family/friends   PATIENT IDENTIFIED PROBLEMS:   Substance Abuse  SI  Depression    " Get my medications back on track"  " help with sadness "         DISCHARGE CRITERIA:  Improved stabilization in mood, thinking, and/or behavior Withdrawal symptoms are absent or subacute and managed without 24-hour nursing intervention  PRELIMINARY DISCHARGE PLAN: Outpatient therapy Return to previous living arrangement  PATIENT/FAMILY INVOLVEMENT: This treatment plan has been presented to and reviewed with the patient, [name], and/or family member, .  The patient and family have been given the opportunity to ask questions and make suggestions.  [name], RN 07/05/2016, 5:06 AM

## 2016-07-05 NOTE — Progress Notes (Signed)
Admission note:  Pt admitted without issue. Skin assessment completed and no contraband found or skin abnormalities noted.  Endorses Passive SI. Contracts for safety. Affect depressed.  Denies SI/HI/AVH Pt wants help getting back on track with medications and depression. Cooperative and appropriate during interaction. Noted to have Left arm amputation.  Pt states she has intermittent weakness of left leg. Pt oriented to room and educated to use call button for assistance. Verbalized understanding with teach back.

## 2016-07-06 LAB — HEMOGLOBIN A1C
HEMOGLOBIN A1C: 5 % (ref 4.8–5.6)
Mean Plasma Glucose: 97 mg/dL

## 2016-07-06 MED ORDER — TRAMADOL HCL 50 MG PO TABS
50.0000 mg | ORAL_TABLET | Freq: Four times a day (QID) | ORAL | Status: DC | PRN
  Administered 2016-07-06 – 2016-07-11 (×14): 50 mg via ORAL
  Filled 2016-07-06 (×15): qty 1

## 2016-07-06 NOTE — Progress Notes (Signed)
Pt denies SI/HI/AVH. Last CIWA score O as Pt is resting well with eyes closed. Attended evening group. Visible in milieu with appropriate behaviors among staff and peers. Medication compliant. Denies pain. No distress noted r/t withdrawal. Safety maintained. Voices no additional concerns at this time. Will continue to monitor.

## 2016-07-06 NOTE — BHH Suicide Risk Assessment (Signed)
[place] INPATIENT:  Family/Significant Other Suicide Prevention Education  Suicide Prevention Education:  Contact Attempts:Emily Rheingans (daugher [phone]) & Sirinity Petermann [phone]), has been identified by the patient as the family member/significant other with whom the patient will be residing, and identified as the person(s) who will aid the patient in the event of a mental health crisis.  With written consent from the patient, two attempts were made to provide suicide prevention education, prior to and/or following the patient's discharge.  We were unsuccessful in providing suicide prevention education.  A suicide education pamphlet was given to the patient to share with family/significant other.  Noelie Nunnery (daugher [phone])  Date and time of first attempt: 07/06/2016 / 2:14pm Date and time of second attempt: 07/03/2016 / 3:02pm  Pelagia Sitzman (son [phone])  Date and time of first attempt: 07/06/2016 / 2:16pm Date and time of second attempt: 07/06/2016 / 3:05pm  Editha Bridgeforth G. Bly[place], [place] 07/06/2016, 3:31 PM

## 2016-07-06 NOTE — BHH Group Notes (Signed)
[place] Group Notes:  (Nursing/MHT/Case Management/Adjunct)  Date:  07/06/2016  Time:  5:01 AM  Type of Therapy:  Psychoeducational Skills  Participation Level:  Did Not Attend  Summary of Progress/Problems:  Reece Agar 07/06/2016, 5:01 AM

## 2016-07-06 NOTE — Plan of Care (Signed)
Problem: Medication: Goal: Compliance with prescribed medication regimen will improve Outcome: Progressing Pt has been compliant with prescribed medications.

## 2016-07-06 NOTE — BHH Group Notes (Signed)
[place] LCSW Group Therapy  07/06/2016 1 pm  Type of Therapy:  Group Therapy  Participation Level:  Active  Participation Quality:  Appropriate, Attentive and Supportive  Affect:  Appropriate  Cognitive:  Oriented  Insight:  Developing/Improving  Engagement in Therapy:  Engaged  Modes of Intervention:  Discussion, Education, Problem-solving, Socialization and Support  Summary of Progress/Problems:Balance in Life This group will address the concept of balance and how it feels and looks when one is unbalanced. Patients will be encouraged to process areas in their lives that are out of balance, and identify reasons for remaining unbalanced. Facilitators will guide patients utilizing problem- solving interventions to address and correct the stressor making their life unbalanced. Understanding and applying boundaries will be explored and addressed for obtaining and maintaining a balanced life. Patients will be encouraged to explore ways to assertively make their unbalanced needs known to significant others in their lives, using other group members and facilitator for support and feedback. This patient was able to reflect and share things- several things contribute to imbalance.  Patient was able to follow group discussions and agreed that keeping follow up appointments and taking medications assist in remaining balanced. She provided good support to  peers.  [place] 07/06/2016, 2:23 PM

## 2016-07-06 NOTE — Progress Notes (Signed)
Patient reports sleeping good last night with sleep medication. Reports appetite good, energy level low, concentration good. Rates depression and hopelessness at 6, and anxiety at 8. Complaints of generalized pain and back pain, pt  requesting meds for anxiety. Withdrawal score =10. Pt goal is to stay focused. Encouragement and support offered. Medications given as prescribed. Prn given for pain and anxiety with some relief. Safety checks maintained. Pt receptive. Denies SI, Hi, AVH. Endorses depression. Reports not feeling well from withdrawing symtoms. Medication and group compliant. Appropriate with staff and peers. Pt remains safe on unit with q 15 min checks.
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
## 2016-07-06 NOTE — BHH Suicide Risk Assessment (Signed)
[place] INPATIENT:  Family/Significant Other Suicide Prevention Education  Suicide Prevention Education:  Family/Significant Other Refusal to Support Patient after Discharge:  Suicide Prevention Education Not Provided:  Patient has identified home of family/significant other as the place the patient will be residing after discharge.  With written consent of the patient, two attempts were made to provide Suicide Prevention Education to [name] [name](daughter [phone]). This person indicates he/she will not be responsible for the patient after discharge. Daughter stated she will be moving to [place] soon and will not be able to look after mother once she is discharged. Daughter currently lives about 2 hours from patient. Daughter states that due to her mothers alcoholism most family have distanced themselves from patient.   [name], [place] 07/06/2016,4:07 PM

## 2016-07-06 NOTE — Plan of Care (Signed)
Problem: Pain Managment: Goal: General experience of comfort will improve Outcome: Not Progressing Patient continues to report pain, requesting pain medications with minimal relief

## 2016-07-06 NOTE — Progress Notes (Signed)
[facility] MD Progress Note  07/06/2016 1:51 PM [name]  MRN:  [id number] Subjective:  "My back and legs hurt". Patient's chief complaint today is her pain. Mood is still a little bit down but is improving. Denies any suicidal thoughts. Patient is having some jitteriness but is much more stable on her feet and tolerating detox better. This is a 61 year old woman with a history of alcohol dependence depression chronic pain chronic medical problems who comes into the hospital after a suicide attempt. Principal Problem: Severe recurrent major depression without psychotic features ([place]) Diagnosis:   Patient Active Problem List   Diagnosis Date Noted  . Overdose of antidepressant [T43.201A] 07/03/2016  . 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 Severe recurrent major depression without psychotic features ([place]) [F33.2] 07/03/2015  . Alcohol use disorder, severe, dependence ([place]) [F10.20] 01/26/2015  . Tobacco use disorder [F17.200] 01/26/2015  . Alcohol withdrawal ([place]) [F10.239] 01/08/2015  . 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 GAD (generalized anxiety disorder) [F41.1] 12/24/2013  . PTSD (post-traumatic stress disorder) [F43.10] 12/22/2013   Total Time spent with patient: 30 minutes  Past Psychiatric History: Patient has a long history of depression and alcohol abuse. This is possibly her most serious suicide attempt.  Past Medical History:  Past Medical History:  Diagnosis Date  . Anxiety   . Anxiety    panic anxiety disorder  . Arthritis   . Asthma    chronic asthmatic bronchitis  . 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 Depression   . ETOH abuse   . Heart murmur   . Insomnia   . Neuropathy [facility])     Past Surgical History:  Procedure Laterality Date  . ABDOMINAL HYSTERECTOMY    . ABDOMINAL HYSTERECTOMY  2007  . 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 ARM AMPUTATION AT SHOULDER Left   . CESAREAN  SECTION    . [place]   x 2  . JOINT REPLACEMENT Right 2012   hip  . LUMBAR LAMINECTOMY/DECOMPRESSION MICRODISCECTOMY Left 04/16/2016   Procedure: LUMBAR LAMINECTOMY/DECOMPRESSION MICRODISCECTOMY 1 LEVEL;  Surgeon: Meade Maw, MD;  Location: [facility] ORS;  Service: Neurosurgery;  Laterality: Left;  Left L4-5 far lateral discectomy, left L4-5 laminoforaminotomy  . TOTAL HIP ARTHROPLASTY Left 09/17/2015   Procedure: TOTAL HIP ARTHROPLASTY;  Surgeon: Dereck Leep, MD;  Location: [facility] ORS;  Service: Orthopedics;  Laterality: Left;   Family History:  Family History  Problem Relation Age of Onset  . COPD Mother   . Alcoholism Father   . COPD Father    Family Psychiatric  History: Positive for alcohol abuse and depression Social History:  History  Alcohol Use  . Yes    Comment: 12 pack beer per day for last week     History  Drug Use No    Comment: Patient denies     Social History   Social History  . Marital status: Divorced    Spouse name: N/A  . Number of children: N/A  . Years of education: N/A   Social History Main Topics  . Smoking status: Current Every Day Smoker    Packs/day: 0.25    Types: Cigarettes  . Smokeless tobacco: Never Used  . Alcohol use Yes     Comment: 12 pack beer per day for last week  . Drug use: No     Comment: Patient denies   .  Sexual activity: Yes   Other Topics Concern  . None   Social History Narrative   ** Merged History Encounter **       Additional Social History:                         Sleep: Fair  Appetite:  Fair  Current Medications: Current Facility-Administered Medications  Medication Dose Route Frequency Provider Last Rate Last Dose  . acetaminophen (TYLENOL) tablet 650 mg  650 mg Oral Q6H PRN Gonzella Lex, MD   650 mg at 07/06/16 [id number]  . alum & mag hydroxide-simeth (MAALOX/MYLANTA) 200-200-20 MG/5ML suspension 30 mL  30 mL Oral Q4H PRN Gonzella Lex, MD      . chlordiazePOXIDE  (LIBRIUM) capsule 25 mg  25 mg Oral TID Gonzella Lex, MD   25 mg at 07/06/16 1213  . cyclobenzaprine (FLEXERIL) tablet 5 mg  5 mg Oral TID PRN Gonzella Lex, MD   5 mg at 07/06/16 1212  . folic acid (FOLVITE) tablet 1 mg  1 mg Oral Daily Gonzella Lex, MD   1 mg at 07/06/16 [id number]  . gabapentin (NEURONTIN) capsule 900 mg  900 mg Oral TID Gonzella Lex, MD   900 mg at 07/06/16 1211  . loperamide (IMODIUM) capsule 2 mg  2 mg Oral PRN Gonzella Lex, MD      . LORazepam (ATIVAN) tablet 1 mg  1 mg Oral Q6H PRN Gonzella Lex, MD   1 mg at 07/06/16 [id number]   Or  . LORazepam (ATIVAN) injection 1 mg  1 mg Intravenous Q6H PRN Gonzella Lex, MD      . magnesium hydroxide (MILK OF MAGNESIA) suspension 30 mL  30 mL Oral Daily PRN Gonzella Lex, MD      . multivitamin with minerals tablet 1 tablet  1 tablet Oral Daily Gonzella Lex, MD   1 tablet at 07/06/16 [id number]  . propranolol (INDERAL) tablet 10 mg  10 mg Oral TID Gonzella Lex, MD   10 mg at 07/06/16 1213  . thiamine (VITAMIN B-1) tablet 100 mg  100 mg Oral Daily Gonzella Lex, MD   100 mg at 07/06/16 [id number]  . traMADol (ULTRAM) tablet 50 mg  50 mg Oral Q6H PRN Gonzella Lex, MD      . traZODone (DESYREL) tablet 200 mg  200 mg Oral QHS Gonzella Lex, MD   200 mg at 07/05/16 2124  . venlafaxine XR (EFFEXOR-XR) 24 hr capsule 150 mg  150 mg Oral Q breakfast Gonzella Lex, MD   150 mg at 07/06/16 [id number]    Lab Results:  Results for orders placed or performed during the hospital encounter of 07/05/16 (from the past 48 hour(s))  Hemoglobin A1c     Status: None   Collection Time: 07/05/16  6:35 AM  Result Value Ref Range   Hgb A1c MFr Bld 5.0 4.8 - 5.6 %    Comment: (NOTE)         Pre-diabetes: 5.7 - 6.4         Diabetes: >6.4         Glycemic control for adults with diabetes: <7.0    Mean Plasma Glucose 97 mg/dL    Comment: (NOTE) Performed At: [facility] [place], [place] [id number] Lindon Romp MD [id number]     Lipid panel     Status: Abnormal   Collection Time:  07/05/16  6:35 AM  Result Value Ref Range   Cholesterol 215 (H) 0 - 200 mg/dL   Triglycerides 131 <150 mg/dL   HDL 53 >40 mg/dL   Total CHOL/HDL Ratio 4.1 RATIO   VLDL 26 0 - 40 mg/dL   LDL Cholesterol 136 (H) 0 - 99 mg/dL    Comment:        Total Cholesterol/HDL:CHD Risk Coronary Heart Disease Risk Table                     Men   Women  1/2 Average Risk   3.4   3.3  Average Risk       5.0   4.4  2 X Average Risk   9.6   7.1  3 X Average Risk  23.4   11.0        Use the calculated Patient Ratio above and the CHD Risk Table to determine the patient's CHD Risk.        ATP III CLASSIFICATION (LDL):  <100     mg/dL   Optimal  100-129  mg/dL   Near or Above                    Optimal  130-159  mg/dL   Borderline  160-189  mg/dL   High  >190     mg/dL   Very High   TSH     Status: None   Collection Time: 07/05/16  6:35 AM  Result Value Ref Range   TSH 0.939 0.350 - 4.500 uIU/mL    Comment: Performed by a 3rd Generation assay with a functional sensitivity of <=0.01 uIU/mL.    Blood Alcohol level:  Lab Results  Component Value Date   ETH 49 (H) 07/02/2016   ETH <5 [id number]    Metabolic Disorder Labs: Lab Results  Component Value Date   HGBA1C 5.0 07/05/2016   MPG 97 07/05/2016   No results found for: PROLACTIN Lab Results  Component Value Date   CHOL 215 (H) 07/05/2016   TRIG 131 07/05/2016   HDL 53 07/05/2016   CHOLHDL 4.1 07/05/2016   VLDL 26 07/05/2016   LDLCALC 136 (H) 07/05/2016   LDLCALC 116 (H) 07/05/2015    Physical Findings: AIMS:  , ,  ,  ,    CIWA:  CIWA-Ar Total: 8 COWS:     Musculoskeletal: Strength & Muscle Tone: within normal limits Gait & Station: broad based Patient leans: N/A  Psychiatric Specialty Exam: Physical Exam  Nursing note and vitals reviewed. Constitutional: She appears well-developed and well-nourished.  HENT:  Head: Normocephalic and atraumatic.  Eyes:  Conjunctivae are normal. Pupils are equal, round, and reactive to light.  Neck: Normal range of motion.  Cardiovascular: Regular rhythm and normal heart sounds.   Respiratory: Effort normal.  GI: Soft.  Musculoskeletal: Normal range of motion.       Arms: Neurological: She is alert.  Skin: Skin is warm and dry.  Psychiatric: Judgment normal. Her mood appears anxious. Her speech is delayed. She is slowed. Thought content is not paranoid. Cognition and memory are normal. She expresses no suicidal ideation.    Review of Systems  Constitutional: Negative.   HENT: Negative.   Eyes: Negative.   Respiratory: Negative.   Cardiovascular: Negative.   Gastrointestinal: Negative.   Musculoskeletal: Positive for back pain and joint pain.  Skin: Negative.   Neurological: Negative.   Psychiatric/Behavioral: Positive for depression and substance abuse. Negative for hallucinations  and suicidal ideas. The patient is nervous/anxious.     Blood pressure (!) 129/55, pulse 75, temperature 98.3 F (36.8 C), temperature source Oral, resp. rate 20, height 5\' 5"  (1.651 m), weight 65.8 kg (145 lb), SpO2 98 %.Body mass index is 24.13 kg/m.  General Appearance: Disheveled  Eye Contact:  Fair  Speech:  Slow  Volume:  Decreased  Mood:  Anxious  Affect:  Blunt  Thought Process:  Goal Directed  Orientation:  Full (Time, Place, and Person)  Thought Content:  Logical  Suicidal Thoughts:  No  Homicidal Thoughts:  No  Memory:  Immediate;   Good Recent;   Fair Remote;   Fair  Judgement:  Fair  Insight:  Fair  Psychomotor Activity:  Decreased  Concentration:  Concentration: Fair  Recall:  [employer] of Knowledge:  Fair  Language:  Fair  Akathisia:  No  Handed:  Right  AIMS (if indicated):     Assets:  Communication Skills Desire for Improvement Housing Resilience  ADL's:  Intact  Cognition:  WNL  Sleep:  Number of Hours: 7.3     Treatment Plan Summary: Daily contact with patient to assess and  evaluate symptoms and progress in treatment, Medication management and Plan 61 year old woman with depression and alcohol abuse. As far as detox she is much more stable than she was yesterday although she still presents as tremulousness and a little confused and sick to her stomach. Patient can be tapered off of standing Librium and should be able to complete detox without difficulty. She will need to establish plans to get back into serious substance abuse treatment after discharge. She denies any suicidal thoughts now on her depression is reported as improving. Patient's chief complaint today is her back pain. Patient had been in a plan with her pain doctor to come off of narcotics. Nevertheless she does not feel the gabapentin is adequate. I will give her tramadol 50 mg when necessary. Educated her that she really needs to stay away from high doses of acetaminophen. Continue daily observation and group involvement with possible discharge to to 3 days.  Shelly Berthold, MD 07/06/2016, 1:51 PM

## 2016-07-07 MED ORDER — HYDROXYZINE HCL 50 MG PO TABS: 50.0000 mg | ORAL_TABLET | Freq: Three times a day (TID) | ORAL | Status: DC | PRN

## 2016-07-07 MED ORDER — WHITE PETROLATUM GEL
Status: DC | PRN
  Filled 2016-07-07: qty 28.35

## 2016-07-07 MED ORDER — TERBINAFINE HCL 1 % EX CREA
TOPICAL_CREAM | Freq: Two times a day (BID) | CUTANEOUS | Status: DC
  Administered 2016-07-07 – 2016-07-11 (×6): via TOPICAL
  Filled 2016-07-07: qty 12

## 2016-07-07 NOTE — BHH Group Notes (Signed)
[place] Group Notes:  (Nursing/MHT/Case Management/Adjunct)  Date:  07/07/2016  Time:  5:24 PM  Type of Therapy:  Psychoeducational Skills  Participation Level:  Active  Participation Quality:  Appropriate, Attentive and Supportive  Affect:  Appropriate  Cognitive:  Appropriate  Insight:  Appropriate  Engagement in Group:  Engaged and Supportive  Modes of Intervention:  Discussion and Education  Summary of Progress/Problems:  Charise Killian 07/07/2016, 5:24 PM

## 2016-07-07 NOTE — Progress Notes (Signed)
[facility] MD Progress Note  07/07/2016 6:36 PM [name]  MRN:  [id number] Subjective:  "My back and legs hurt". Patient's chief complaint today is her pain. Mood is still a little bit down but is improving. Denies any suicidal thoughts. Patient is having some jitteriness but is much more stable on her feet and tolerating detox better. This is a 61 year old woman with a history of alcohol dependence depression chronic pain chronic medical problems who comes into the hospital after a suicide attempt.  Follow-up for January 52. 61 year old woman with a history of depression and alcohol abuse. Patient states that her mood is feeling better today. Still has anxiety attacks but is not having suicidal thoughts. She is still having some feelings of mild subjective alcohol withdrawal but her vital signs are stable and she is stable on her feet and eating and drinking well. Goes to groups. Working with treatment team on a appropriate Principal Problem: Severe recurrent major depression without psychotic features ([place]) Diagnosis:   Patient Active Problem List   Diagnosis Date Noted  . Overdose of antidepressant [T43.201A] 07/03/2016  . 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 Severe recurrent major depression without psychotic features ([place]) [F33.2] 07/03/2015  . Alcohol use disorder, severe, dependence ([place]) [F10.20] 01/26/2015  . Tobacco use disorder [F17.200] 01/26/2015  . Alcohol withdrawal ([place]) [F10.239] 01/08/2015  . 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 GAD (generalized anxiety disorder) [F41.1] 12/24/2013  . PTSD (post-traumatic stress disorder) [F43.10] 12/22/2013   Total Time spent with patient: 30 minutes  Past Psychiatric History: Patient has a long history of depression and alcohol abuse. This is possibly her most serious suicide attempt.  Past Medical History:  Past Medical History:  Diagnosis Date  . Anxiety   . Anxiety    panic  anxiety disorder  . Arthritis   . Asthma    chronic asthmatic bronchitis  . Cancer [facility])    Desmoid tumor left forearm  . Depression   . ETOH abuse   . Heart murmur   . Insomnia   . Neuropathy [name])     Past Surgical History:  Procedure Laterality Date  . ABDOMINAL HYSTERECTOMY    . ABDOMINAL HYSTERECTOMY  2007  . 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 ARM AMPUTATION AT SHOULDER Left   . CESAREAN SECTION    . [place]   x 2  . JOINT REPLACEMENT Right 2012   hip  . LUMBAR LAMINECTOMY/DECOMPRESSION MICRODISCECTOMY Left 04/16/2016   Procedure: LUMBAR LAMINECTOMY/DECOMPRESSION MICRODISCECTOMY 1 LEVEL;  Surgeon: Meade Maw, MD;  Location: [facility] ORS;  Service: Neurosurgery;  Laterality: Left;  Left L4-5 far lateral discectomy, left L4-5 laminoforaminotomy  . TOTAL HIP ARTHROPLASTY Left 09/17/2015   Procedure: TOTAL HIP ARTHROPLASTY;  Surgeon: Dereck Leep, MD;  Location: [facility] ORS;  Service: Orthopedics;  Laterality: Left;   Family History:  Family History  Problem Relation Age of Onset  . COPD Mother   . Alcoholism Father   . COPD Father    Family Psychiatric  History: Positive for alcohol abuse and depression Social History:  History  Alcohol Use  . Yes    Comment: 12 pack beer per day for last week     History  Drug Use No    Comment: Patient denies     Social History   Social History  . Marital status: Divorced    Spouse name: N/A  .  Number of children: N/A  . Years of education: N/A   Social History Main Topics  . Smoking status: Current Every Day Smoker    Packs/day: 0.25    Types: Cigarettes  . Smokeless tobacco: Never Used  . Alcohol use Yes     Comment: 12 pack beer per day for last week  . Drug use: No     Comment: Patient denies   . Sexual activity: Yes   Other Topics Concern  . None   Social History Narrative   ** Merged History Encounter **       Additional Social History:                          Sleep: Fair  Appetite:  Fair  Current Medications: Current Facility-Administered Medications  Medication Dose Route Frequency Provider Last Rate Last Dose  . acetaminophen (TYLENOL) tablet 650 mg  650 mg Oral Q6H PRN Gonzella Lex, MD   650 mg at 07/06/16 [id number]  . alum & mag hydroxide-simeth (MAALOX/MYLANTA) 200-200-20 MG/5ML suspension 30 mL  30 mL Oral Q4H PRN Gonzella Lex, MD      . cyclobenzaprine (FLEXERIL) tablet 5 mg  5 mg Oral TID PRN Gonzella Lex, MD   5 mg at 07/07/16 0819  . folic acid (FOLVITE) tablet 1 mg  1 mg Oral Daily Gonzella Lex, MD   1 mg at 07/07/16 0819  . gabapentin (NEURONTIN) capsule 900 mg  900 mg Oral TID Gonzella Lex, MD   900 mg at 07/07/16 1619  . loperamide (IMODIUM) capsule 2 mg  2 mg Oral PRN Gonzella Lex, MD      . LORazepam (ATIVAN) tablet 1 mg  1 mg Oral Q6H PRN Gonzella Lex, MD   1 mg at 07/06/16 [id number]   Or  . LORazepam (ATIVAN) injection 1 mg  1 mg Intravenous Q6H PRN Gonzella Lex, MD      . magnesium hydroxide (MILK OF MAGNESIA) suspension 30 mL  30 mL Oral Daily PRN Gonzella Lex, MD      . multivitamin with minerals tablet 1 tablet  1 tablet Oral Daily Gonzella Lex, MD   1 tablet at 07/07/16 0819  . propranolol (INDERAL) tablet 10 mg  10 mg Oral TID Gonzella Lex, MD   10 mg at 07/07/16 1619  . thiamine (VITAMIN B-1) tablet 100 mg  100 mg Oral Daily Gonzella Lex, MD   100 mg at 07/07/16 0819  . tolnaftate (TINACTIN) 1 % cream   Topical BID Gonzella Lex, MD      . traMADol Veatrice Bourbon) tablet 50 mg  50 mg Oral Q6H PRN Gonzella Lex, MD   50 mg at 07/07/16 1435  . traZODone (DESYREL) tablet 200 mg  200 mg Oral QHS Gonzella Lex, MD   200 mg at 07/06/16 2128  . venlafaxine XR (EFFEXOR-XR) 24 hr capsule 150 mg  150 mg Oral Q breakfast Gonzella Lex, MD   150 mg at 07/07/16 0819  . white petrolatum (VASELINE) gel   Topical PRN Gonzella Lex, MD        Lab Results:  No results found for this or any previous visit  (from the past 48 hour(s)).  Blood Alcohol level:  Lab Results  Component Value Date   ETH 49 (H) 07/02/2016   ETH <5 [id number]    Metabolic Disorder Labs: Lab Results  Component  Value Date   HGBA1C 5.0 07/05/2016   MPG 97 07/05/2016   No results found for: PROLACTIN Lab Results  Component Value Date   CHOL 215 (H) 07/05/2016   TRIG 131 07/05/2016   HDL 53 07/05/2016   CHOLHDL 4.1 07/05/2016   VLDL 26 07/05/2016   LDLCALC 136 (H) 07/05/2016   LDLCALC 116 (H) 07/05/2015    Physical Findings: AIMS:  , ,  ,  ,    CIWA:  CIWA-Ar Total: 4 COWS:     Musculoskeletal: Strength & Muscle Tone: within normal limits Gait & Station: broad based Patient leans: N/A  Psychiatric Specialty Exam: Physical Exam  Nursing note and vitals reviewed. Constitutional: She appears well-developed and well-nourished.  HENT:  Head: Normocephalic and atraumatic.  Eyes: Conjunctivae are normal. Pupils are equal, round, and reactive to light.  Neck: Normal range of motion.  Cardiovascular: Regular rhythm and normal heart sounds.   Respiratory: Effort normal.  GI: Soft.  Musculoskeletal: Normal range of motion.       Arms: Neurological: She is alert.  Skin: Skin is warm and dry.  Psychiatric: Judgment normal. Her mood appears anxious. Her speech is delayed. She is slowed. Thought content is not paranoid. Cognition and memory are normal. She expresses no suicidal ideation.    Review of Systems  Constitutional: Negative.   HENT: Negative.   Eyes: Negative.   Respiratory: Negative.   Cardiovascular: Negative.   Gastrointestinal: Negative.   Musculoskeletal: Positive for back pain and joint pain.  Skin: Negative.   Neurological: Negative.   Psychiatric/Behavioral: Positive for depression and substance abuse. Negative for hallucinations and suicidal ideas. The patient is nervous/anxious.     Blood pressure 118/62, pulse 68, temperature 98.6 F (37 C), temperature source Oral, resp. rate  20, height 5\' 5"  (1.651 m), weight 65.8 kg (145 lb), SpO2 98 %.Body mass index is 24.13 kg/m.  General Appearance: Disheveled  Eye Contact:  Fair  Speech:  Slow  Volume:  Decreased  Mood:  Anxious  Affect:  Blunt  Thought Process:  Goal Directed  Orientation:  Full (Time, Place, and Person)  Thought Content:  Logical  Suicidal Thoughts:  No  Homicidal Thoughts:  No  Memory:  Immediate;   Good Recent;   Fair Remote;   Fair  Judgement:  Fair  Insight:  Fair  Psychomotor Activity:  Decreased  Concentration:  Concentration: Fair  Recall:  [employer] of Knowledge:  Fair  Language:  Fair  Akathisia:  No  Handed:  Right  AIMS (if indicated):     Assets:  Communication Skills Desire for Improvement Housing Resilience  ADL's:  Intact  Cognition:  WNL  Sleep:  Number of Hours: 7.3     Treatment Plan Summary: Daily contact with patient to assess and evaluate symptoms and progress in treatment, Medication management and Plan Patient is interested and motivated in going to the alcohol and drug abuse treatment Center in [place]. She has been there before and is familiar with program. I think this would be an excellent idea and we are supportive of helping her achieve this. No change to medicine except add antifungal cream as requested for her feet and chap stick or Vaseline for her lips. Follow-up daily as needed.  Alethia Berthold, MD 07/07/2016, 6:36 PM

## 2016-07-07 NOTE — Plan of Care (Signed)
Problem: Coping: Goal: Ability to verbalize frustrations and anger appropriately will improve Outcome: Progressing Patient verbalizes frustrations with Probation officer

## 2016-07-07 NOTE — Progress Notes (Signed)
Recreation Therapy Notes  Date: 01.15.18 Time: 1:00 pm Location: Craft Room  Group Topic: Wellness  Goal Area(s) Addresses:  Patient will identify at least one item per dimension of health. Patient will examine areas they are deficient in.  Behavioral Response: Attentive, Interactive  Intervention: 6 Dimensions of Health  Activity: Patients were given a definition sheet with each dimension. Patients were given a worksheet with each dimension listed and were instructed to write at least one item they were currently doing in each dimension. LRT encouraged patient to write 2-3 items.  Education: LRT educated patients on ways to improve each dimension.  Education Outcome: Acknowledges education/In group clarification offered   Clinical Observations/Feedback: Patient wrote at least one item in each dimension. Patient contributed to group discussion by stating what areas she was giving enough attention to, what areas she was not giving enough attention to, what she could do to improve certain dimensions, how this activity relates to her admission, how this activity relates to her d/c, and what would change for her if she was more aware of her wellness.  Leonette Monarch, LRT/CTRS 07/07/2016 2:10 PM

## 2016-07-07 NOTE — Progress Notes (Signed)
Recreation Therapy Notes  INPATIENT RECREATION THERAPY ASSESSMENT  Patient Details Name: [name] MRN: [id number] DOB: 09-12-1955 Today's Date: 07/07/2016  Patient Stressors: Family (Son moved to [place], [place] and daughter moves to [place] for 2 years for a teaching job - pt will not have any family and will be completely alone )  Coping Skills:   Isolate, Substance Abuse, Avoidance, Art/Dance, Talking, Music, Sports, Other (Comment) ([facility] outpatient, meditation)  Personal Challenges: Concentration, Decision-Making, Problem-Solving, Relationships, Self-Esteem/Confidence, Social Interaction, Stress Management, Substance Abuse, Time Management, Trusting Others  Leisure Interests (2+):  Individual - Other (Comment) (Dancing, watching movies or sports)  Awareness of Community Resources:  No  Community Resources:     Current Use:    If no, Barriers?:    Patient Strengths:  Higher education careers adviser lover, compassionate  Patient Identified Areas of Improvement:  Everything  Current Recreation Participation:  Nothing  Patient Goal for Hospitalization:  To finish detox and get treatment  [place] of Residence:  [place] of Residence:  [place]   Current SI (including self-harm):  No  Current HI:  No  Consent to Intern Participation: N/A   Leonette Monarch, LRT/CTRS 07/07/2016, 2:54 PM

## 2016-07-07 NOTE — [facility] Group Notes (Signed)
[facility] LCSW Group Therapy Note  Date/Time: 07/07/16 0930  Type of Therapy and Topic:  Group Therapy:  Overcoming Obstacles  Participation Level:  active  Description of Group:    In this group patients will be encouraged to explore what they see as obstacles to their own wellness and recovery. They will be guided to discuss their thoughts, feelings, and behaviors related to these obstacles. The group will process together ways to cope with barriers, with attention given to specific choices patients can make. Each patient will be challenged to identify changes they are motivated to make in order to overcome their obstacles. This group will be process-oriented, with patients participating in exploration of their own experiences as well as giving and receiving support and challenge from other group members.  Therapeutic Goals: 1. Patient will identify personal and current obstacles as they relate to admission. 2. Patient will identify barriers that currently interfere with their wellness or overcoming obstacles.  3. Patient will identify feelings, thought process and behaviors related to these barriers. 4. Patient will identify two changes they are willing to make to overcome these obstacles:    Summary of Patient Progress: Pt shared that her husband left her in 2005 and she turned to alcohol, which became the biggest obstacle that she still has not overcome.  She also shared that she has two adult children.  Her son recently took a job in [place], [place] and her daughter has not accepted a teaching position in [place] and will be leaving this summer.  She reported that replacing their support in her life is important and she is thinking of getting more involved in [place] and with her church.  She also shared that volunteering and helping others has helped her to feel better about her own situation in the past.     Therapeutic Modalities:   Cognitive Behavioral Therapy Solution Focused  Therapy Motivational Interviewing Relapse Prevention Therapy  Lurline Idol, LCSW

## 2016-07-07 NOTE — Progress Notes (Signed)
Patient presents with sad, flat  affect but brightens on approach. Denies SI, HI, AVH. Endorses depression. CIWA =2. Patient medication and group compliant. Appropriate with staff and peers. Encouragement and support offered. Safety checks maintained. Medications given as prescribed. Pt receptive and remains safe on unit with q 15 min checks.

## 2016-07-08 MED ORDER — CYCLOBENZAPRINE HCL 10 MG PO TABS
10.0000 mg | ORAL_TABLET | Freq: Four times a day (QID) | ORAL | Status: DC | PRN
  Administered 2016-07-08 – 2016-07-11 (×8): 10 mg via ORAL
  Filled 2016-07-08 (×8): qty 1

## 2016-07-08 NOTE — Progress Notes (Signed)
[facility] MD Progress Note  07/08/2016 6:45 PM [name]  MRN:  [id number] Subjective:  "My back and legs hurt". Patient's chief complaint today is her pain. Mood is still a little bit down but is improving. Denies any suicidal thoughts. Patient is having some jitteriness but is much more stable on her feet and tolerating detox better. This is a 61 year old woman with a history of alcohol dependence depression chronic pain chronic medical problems who comes into the hospital after a suicide attempt.  Follow-up for January 32. 61 year old woman with a history of depression and alcohol abuse. Patient states that her mood is feeling better today. Still has anxiety attacks but is not having suicidal thoughts. She is still having some feelings of mild subjective alcohol withdrawal but her vital signs are stable and she is stable on her feet and eating and drinking well. Goes to groups. Working with treatment team on a appropriate  Follow-up for January 16. Patient seen and met with treatment team. Patient remains overly focused on her pain issues and her medications. Affect is brighter. Physically much more stable. Still agrees to go to the alcohol and drug abuse treatment program. Attending some groups. Responds reasonably well to redirection.  Principal Problem: Severe recurrent major depression without psychotic features ([place]) Diagnosis:   Patient Active Problem List   Diagnosis Date Noted  . Overdose of antidepressant [T43.201A] 07/03/2016  . 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 Severe recurrent major depression without psychotic features ([place]) [F33.2] 07/03/2015  . Alcohol use disorder, severe, dependence ([place]) [F10.20] 01/26/2015  . Tobacco use disorder [F17.200] 01/26/2015  . Alcohol withdrawal ([place]) [F10.239] 01/08/2015  . 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 GAD (generalized anxiety disorder) [F41.1] 12/24/2013  . PTSD (post-traumatic  stress disorder) [F43.10] 12/22/2013   Total Time spent with patient: 30 minutes  Past Psychiatric History: Patient has a long history of depression and alcohol abuse. This is possibly her most serious suicide attempt.  Past Medical History:  Past Medical History:  Diagnosis Date  . Anxiety   . Anxiety    panic anxiety disorder  . Arthritis   . Asthma    chronic asthmatic bronchitis  . Cancer [facility])    Desmoid tumor left forearm  . Depression   . ETOH abuse   . Heart murmur   . Insomnia   . Neuropathy [facility])     Past Surgical History:  Procedure Laterality Date  . ABDOMINAL HYSTERECTOMY    . ABDOMINAL HYSTERECTOMY  2007  . 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 ARM AMPUTATION AT SHOULDER Left   . CESAREAN SECTION    . [place]   x 2  . JOINT REPLACEMENT Right 2012   hip  . LUMBAR LAMINECTOMY/DECOMPRESSION MICRODISCECTOMY Left 04/16/2016   Procedure: LUMBAR LAMINECTOMY/DECOMPRESSION MICRODISCECTOMY 1 LEVEL;  Surgeon: Meade Maw, MD;  Location: [facility] ORS;  Service: Neurosurgery;  Laterality: Left;  Left L4-5 far lateral discectomy, left L4-5 laminoforaminotomy  . TOTAL HIP ARTHROPLASTY Left 09/17/2015   Procedure: TOTAL HIP ARTHROPLASTY;  Surgeon: Dereck Leep, MD;  Location: [facility] ORS;  Service: Orthopedics;  Laterality: Left;   Family History:  Family History  Problem Relation Age of Onset  . COPD Mother   . Alcoholism Father   . COPD Father    Family Psychiatric  History: Positive for alcohol abuse and depression Social History:  History  Alcohol Use  .  Yes    Comment: 12 pack beer per day for last week     History  Drug Use No    Comment: Patient denies     Social History   Social History  . Marital status: Divorced    Spouse name: N/A  . Number of children: N/A  . Years of education: N/A   Social History Main Topics  . Smoking status: Current Every Day Smoker    Packs/day: 0.25    Types: Cigarettes    . Smokeless tobacco: Never Used  . Alcohol use Yes     Comment: 12 pack beer per day for last week  . Drug use: No     Comment: Patient denies   . Sexual activity: Yes   Other Topics Concern  . None   Social History Narrative   ** Merged History Encounter **       Additional Social History:                         Sleep: Fair  Appetite:  Fair  Current Medications: Current Facility-Administered Medications  Medication Dose Route Frequency Provider Last Rate Last Dose  . acetaminophen (TYLENOL) tablet 650 mg  650 mg Oral Q6H PRN Gonzella Lex, MD   650 mg at 07/06/16 3382  . alum & mag hydroxide-simeth (MAALOX/MYLANTA) 200-200-20 MG/5ML suspension 30 mL  30 mL Oral Q4H PRN Gonzella Lex, MD      . cyclobenzaprine (FLEXERIL) tablet 5 mg  5 mg Oral TID PRN Gonzella Lex, MD   5 mg at 07/08/16 1513  . folic acid (FOLVITE) tablet 1 mg  1 mg Oral Daily Gonzella Lex, MD   1 mg at 07/08/16 0841  . gabapentin (NEURONTIN) capsule 900 mg  900 mg Oral TID Gonzella Lex, MD   900 mg at 07/08/16 1600  . hydrOXYzine (ATARAX/VISTARIL) tablet 50 mg  50 mg Oral TID PRN Gonzella Lex, MD      . loperamide (IMODIUM) capsule 2 mg  2 mg Oral PRN Gonzella Lex, MD      . magnesium hydroxide (MILK OF MAGNESIA) suspension 30 mL  30 mL Oral Daily PRN Gonzella Lex, MD      . multivitamin with minerals tablet 1 tablet  1 tablet Oral Daily Gonzella Lex, MD   1 tablet at 07/08/16 0841  . propranolol (INDERAL) tablet 10 mg  10 mg Oral TID Gonzella Lex, MD   10 mg at 07/08/16 1600  . terbinafine (LAMISIL) 1 % cream   Topical BID Gonzella Lex, MD      . thiamine (VITAMIN B-1) tablet 100 mg  100 mg Oral Daily Gonzella Lex, MD   100 mg at 07/08/16 0841  . traMADol (ULTRAM) tablet 50 mg  50 mg Oral Q6H PRN Gonzella Lex, MD   50 mg at 07/08/16 1513  . traZODone (DESYREL) tablet 200 mg  200 mg Oral QHS Gonzella Lex, MD   200 mg at 07/07/16 2104  . venlafaxine XR (EFFEXOR-XR) 24 hr  capsule 150 mg  150 mg Oral Q breakfast Gonzella Lex, MD   150 mg at 07/08/16 0842  . white petrolatum (VASELINE) gel   Topical PRN Gonzella Lex, MD        Lab Results:  No results found for this or any previous visit (from the past 48 hour(s)).  Blood Alcohol level:  Lab Results  Component Value Date   ETH 49 (H) 07/02/2016   ETH <5 79/15/0569    Metabolic Disorder Labs: Lab Results  Component Value Date   HGBA1C 5.0 07/05/2016   MPG 97 07/05/2016   No results found for: PROLACTIN Lab Results  Component Value Date   CHOL 215 (H) 07/05/2016   TRIG 131 07/05/2016   HDL 53 07/05/2016   CHOLHDL 4.1 07/05/2016   VLDL 26 07/05/2016   LDLCALC 136 (H) 07/05/2016   LDLCALC 116 (H) 07/05/2015    Physical Findings: AIMS:  , ,  ,  ,    CIWA:  CIWA-Ar Total: 4 COWS:     Musculoskeletal: Strength & Muscle Tone: within normal limits Gait & Station: broad based Patient leans: N/A  Psychiatric Specialty Exam: Physical Exam  Nursing note and vitals reviewed. Constitutional: She appears well-developed and well-nourished.  HENT:  Head: Normocephalic and atraumatic.  Eyes: Conjunctivae are normal. Pupils are equal, round, and reactive to light.  Neck: Normal range of motion.  Cardiovascular: Regular rhythm and normal heart sounds.   Respiratory: Effort normal.  GI: Soft.  Musculoskeletal: Normal range of motion.       Arms: Neurological: She is alert.  Skin: Skin is warm and dry.  Psychiatric: Judgment normal. Her mood appears anxious. Her speech is delayed. She is slowed. Thought content is not paranoid. Cognition and memory are normal. She expresses no suicidal ideation.    Review of Systems  Constitutional: Negative.   HENT: Negative.   Eyes: Negative.   Respiratory: Negative.   Cardiovascular: Negative.   Gastrointestinal: Negative.   Musculoskeletal: Positive for back pain and joint pain.  Skin: Negative.   Neurological: Negative.   Psychiatric/Behavioral:  Positive for depression and substance abuse. Negative for hallucinations and suicidal ideas. The patient is nervous/anxious.     Blood pressure (!) 115/57, pulse 88, temperature 98.4 F (36.9 C), temperature source Oral, resp. rate 20, height '5\' 5"'$  (1.651 m), weight 65.8 kg (145 lb), SpO2 98 %.Body mass index is 24.13 kg/m.  General Appearance: Disheveled  Eye Contact:  Fair  Speech:  Slow  Volume:  Decreased  Mood:  Anxious  Affect:  Blunt  Thought Process:  Goal Directed  Orientation:  Full (Time, Place, and Person)  Thought Content:  Logical  Suicidal Thoughts:  No  Homicidal Thoughts:  No  Memory:  Immediate;   Good Recent;   Fair Remote;   Fair  Judgement:  Fair  Insight:  Fair  Psychomotor Activity:  Decreased  Concentration:  Concentration: Fair  Recall:  [employer] of Knowledge:  Fair  Language:  Fair  Akathisia:  No  Handed:  Right  AIMS (if indicated):     Assets:  Communication Skills Desire for Improvement Housing Resilience  ADL's:  Intact  Cognition:  WNL  Sleep:  Number of Hours: 7.45     Treatment Plan Summary: Daily contact with patient to assess and evaluate symptoms and progress in treatment, Medication management and Plan Treatment team met and clarified plan to refer the patient to the alcohol and drug abuse treatment Center in [place]. I will allow her to have a full 10 mg of Flexeril at her request. Made it clear to her that we are not going to get into more opiates and benzodiazepines sedatives. Continue engagement in groups and appropriate individual daily therapy.  Alethia Berthold, MD 07/08/2016, 6:45 PM

## 2016-07-08 NOTE — Progress Notes (Signed)
[place] responded to the OR to visit this Pt who had request prayer. CH met with Pt. Pt was in good mood. Pt told Ch, she had suicidal thoughts before, but was striving to get better now. Pt expressed a desire to have spiritual support to get back on the right track mentally and spiritually. Pt also expressed grief over being divorce, and was concerned about her daughter moving to [place] for work. Pt mentioned repeatedly she was lonely and would be even lonilier when he daughter moves to [place] in July. CH provided presence and prayers for the concerns Pt expressed.    07/08/16 2000  Clinical Encounter Type  Visited With Patient  Visit Type Initial;Psychological support;Spiritual support  Referral From Nurse  Consult/Referral To Chaplain  Spiritual Encounters  Spiritual Needs Prayer;Other (Comment)

## 2016-07-08 NOTE — Progress Notes (Signed)
Patient with appropriate affect, cooperative behavior with meals, meds and plan of care. Talkative and appropriate with staff and peers. No SI/HI at this time. Therapy encouraged, safety maintained.

## 2016-07-08 NOTE — Progress Notes (Addendum)
Patient requested chaplain, phone call placed., no cal at office.

## 2016-07-08 NOTE — BHH Group Notes (Signed)
[facility] Group Notes:  (Nursing/MHT/Case Management/Adjunct)  Date:  07/08/2016  Time:  9:36 PM  Type of Therapy:  Psychoeducational Skills  Participation Level:  Active  Participation Quality:  Attentive  Affect:  Defensive  Cognitive:  Alert  Insight:  Good  Engagement in Group:  Defensive  Modes of Intervention:  Exploration  Summary of Progress/Problems:  [name] 07/08/2016, 9:36 PM

## 2016-07-08 NOTE — Progress Notes (Signed)
Patient ID: [name], female   DOB: 09-24-1955, 61 y.o.   MRN: [id number] A&Ox3, sociable, observed in the day room with others, c/o 8/10 chronic lower back pain/discomfort: Tramadol 50 mg and Flexeril 5 mg PO, PRN given for muscle spasm. Denied SI/HI, "I will be discharged to [facility] to continue with my treatments..."

## 2016-07-08 NOTE — Plan of Care (Signed)
Problem: Activity: Goal: Sleeping patterns will improve Outcome: Progressing Patient slept for Estimated Hours of 7.45; safety maintained, no injury or falls during this shift.

## 2016-07-08 NOTE — Progress Notes (Signed)
Recreation Therapy Notes  Date: 01.16.18 Time: 3:00 pm Location: Craft Room  Group Topic: Self-expression  Goal Area(s) Addresses:  Patient will be able to identify a color that represents each emotion. Patient will verbalize benefit of using art as a means of self-expression. Patient will verbalize one emotion experienced while participating in activity.  Behavioral Response: Attentive, Interactive  Intervention: The Colors Within Me  Activity: Patients were given a blank face worksheet and were instructed to pick a color for each emotion and show on the worksheet how much of that emotion they were feeling.  Education: LRT educated patients on different forms of self-expression.  Education Outcome: Acknowledges education/In group clarification offered   Clinical Observations/Feedback: Patient picked a color for each emotion she was feeling and showed on the worksheet how much of that emotion she was feeling. Patient contributed to group discussion by stating what emotions she was feeling, how her emotions affect her treatment in the hospital, that her emotions are dynamic, how her emotions will change one she is d/c, that it was helpful to see her emotions on paper and why, and what emotions she felt during group.  Leonette Monarch, LRT/CTRS 07/08/2016 4:20 PM

## 2016-07-08 NOTE — Tx Team (Signed)
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 acetaminophen (TYLENOL) tablet 650 mg  650 mg Oral Q6H PRN Gonzella Lex, MD   650 mg at 07/06/16 [id number]  . alum & mag hydroxide-simeth (MAALOX/MYLANTA) 200-200-20 MG/5ML suspension 30 mL  30 mL Oral Q4H PRN Gonzella Lex, MD      . cyclobenzaprine (FLEXERIL) tablet 5 mg  5 mg Oral TID PRN Gonzella Lex, MD   5 mg at 07/07/16 2105  . folic acid (FOLVITE) tablet 1 mg  1 mg Oral Daily Gonzella Lex, MD   1 mg at 07/08/16 0841  . gabapentin (NEURONTIN) capsule 900 mg  900 mg Oral TID Gonzella Lex, MD   900 mg at 07/08/16 1213  . hydrOXYzine (ATARAX/VISTARIL) tablet 50 mg  50 mg Oral TID PRN Gonzella Lex, MD      . loperamide (IMODIUM) capsule 2 mg  2 mg Oral PRN Gonzella Lex, MD      . magnesium hydroxide (MILK OF MAGNESIA) suspension 30 mL  30 mL Oral Daily PRN Gonzella Lex, MD      . multivitamin with minerals tablet 1 tablet  1 tablet Oral Daily Gonzella Lex, MD   1 tablet at 07/08/16 0841  . propranolol (INDERAL) tablet 10 mg  10 mg Oral TID Gonzella Lex, MD   10 mg at 07/08/16 1213  . terbinafine (LAMISIL) 1 % cream   Topical BID Gonzella Lex, MD      . thiamine (VITAMIN B-1) tablet 100 mg  100 mg Oral Daily Gonzella Lex, MD   100 mg at 07/08/16 0841  . traMADol  (ULTRAM) tablet 50 mg  50 mg Oral Q6H PRN Gonzella Lex, MD   50 mg at 07/07/16 2109  . traZODone (DESYREL) tablet 200 mg  200 mg Oral QHS Gonzella Lex, MD   200 mg at 07/07/16 2104  . venlafaxine XR (EFFEXOR-XR) 24 hr capsule 150 mg  150 mg Oral Q breakfast Gonzella Lex, MD   150 mg at 07/08/16 0842  . white petrolatum (VASELINE) gel   Topical PRN Gonzella Lex, MD       PTA Medications: Prescriptions Prior to Admission  Medication Sig Dispense Refill Last Dose  . chlordiazePOXIDE (LIBRIUM) 10 MG capsule Take 1 capsule (10 mg total) by mouth 3 (three) times daily as needed for anxiety or withdrawal. 6 capsule 0 unknown at unknown  . gabapentin (NEURONTIN) 300 MG capsule Take 3 capsules (900 mg total) by mouth 3 (three) times daily with meals. (Patient taking differently: Take 600 mg by mouth 3 (three) times daily with meals. ) 270 capsule 0 unknown at unknown  . HYDROcodone-acetaminophen (NORCO) 5-325 MG tablet Take 1 tablet by mouth every 6 (six) hours as needed for moderate pain. 30 tablet 0 unknown at unknown  . ondansetron (ZOFRAN ODT) 4 MG disintegrating tablet Take 1  tablet (4 mg total) by mouth every 8 (eight) hours as needed for nausea or vomiting. 20 tablet 0 unknown at unknown  . propranolol (INDERAL) 10 MG tablet Take 1 tablet (10 mg total) by mouth 3 (three) times daily with meals. 90 tablet 0 unknown at unknown  . tiZANidine (ZANAFLEX) 4 MG tablet Take 1 tablet (4 mg total) by mouth every 6 (six) hours as needed for muscle spasms. 30 tablet 0 unknown at unknown  . traZODone (DESYREL) 150 MG tablet Take 1 tablet (150 mg total) by mouth at bedtime. (Patient taking differently: Take 150-300 mg by mouth at bedtime as needed. ) 30 tablet 0 unknown at unknown  . venlafaxine XR (EFFEXOR-XR) 150 MG 24 hr capsule Take 2 capsules (300 mg total) by mouth daily with breakfast. 60 capsule 0 unknown at unknown    Patient Stressors: Substance abuse  Patient Strengths: Capable of independent  living Agricultural engineer for treatment/growth Supportive family/friends  Treatment Modalities: Medication Management, Group therapy, Case management,  1 to 1 session with clinician, Psychoeducation, Recreational therapy.   Physician Treatment Plan for Primary Diagnosis: Severe recurrent major depression without psychotic features ([place]) Long Term Goal(s): Improvement in symptoms so as ready for discharge Improvement in symptoms so as ready for discharge   Short Term Goals: Ability to verbalize feelings will improve Ability to disclose and discuss suicidal ideas Ability to identify and develop effective coping behaviors will improve Compliance with prescribed medications will improve  Medication Management: Evaluate patient's response, side effects, and tolerance of medication regimen.  Therapeutic Interventions: 1 to 1 sessions, Unit Group sessions and Medication administration.  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  Therapeutic Interventions: 1 to 1 sessions, Unit Group sessions and Medication administration.  Evaluation  of Outcomes: Progressing   RN Treatment Plan for Primary Diagnosis: Severe recurrent major depression without psychotic features ([place]) Long Term Goal(s): Knowledge of disease and therapeutic regimen to maintain health will improve  Short Term Goals: Ability to remain free from injury will improve, Ability to verbalize frustration and anger appropriately will improve, Ability to demonstrate self-control, Ability to disclose and discuss suicidal ideas and Compliance with prescribed medications will improve  Medication Management: RN will administer medications as ordered by provider, will assess and evaluate patient's response and provide education to patient for prescribed medication. RN will report any adverse and/or side effects to prescribing provider.  Therapeutic Interventions: 1 on 1 counseling sessions, Psychoeducation, Medication administration, Evaluate responses to treatment, Monitor vital signs and CBGs as ordered, Perform/monitor CIWA, COWS, AIMS and Fall Risk screenings as ordered, Perform wound care treatments as ordered.  Evaluation of Outcomes: Progressing   LCSW Treatment Plan for Primary Diagnosis: Severe recurrent major depression without psychotic features ([place]) Long Term Goal(s):  Safe transition to appropriate next level of care at discharge, Engage patient in therapeutic group addressing interpersonal concerns.  Short Term Goals: Engage patient in aftercare planning with referrals and resources, Increase social support, Increase emotional regulation, Identify triggers associated with mental health/substance abuse issues and Increase skills for wellness and recovery  Therapeutic Interventions: Assess for all discharge needs, 1 to 1 time with Social worker, Explore available resources and support systems, Assess for adequacy in community support network, Educate family and significant other(s) on suicide prevention, Complete Psychosocial Assessment, Interpersonal group  therapy.  Evaluation of Outcomes: Progressing   Progress in Treatment: Attending groups: Yes. Participating in groups: Yes. Taking medication as prescribed: Yes. Toleration medication: Yes. Family/Significant other contact made: Yes, individual(s) contacted:  daughter. Patient understands diagnosis: Yes. Discussing patient identified problems/goals with staff: Yes. Medical problems stabilized or resolved: Yes. Denies suicidal/homicidal ideation: Yes. Issues/concerns per patient self-inventory: No.  New problem(s) identified: No, Describe:  None identified.   Discharge Plan or Barriers: [facility] referral   Reason for Continuation of Hospitalization: Anxiety Depression Withdrawal symptoms  Estimated Length of Stay: 3-5 days   Attendees: Patient: [name]  07/08/2016 1:42 PM  Physician: Dr. Gonzella Lex, MD  07/08/2016 1:42 PM  Nursing: Carolynn Sayers, RN  07/08/2016 1:42 PM  RN Care Manager: 07/08/2016 1:42 PM  Social Worker: Glorious Peach, MSW, LCSW-A 07/08/2016 1:42 PM  Recreational Therapist:  07/08/2016 1:42 PM  Other: Dossie Arbour, MSW, LCSW 07/08/2016 1:42 PM  Other:  07/08/2016 1:42 PM  Other: 07/08/2016 1:42 PM    Scribe for Treatment Team: Emilie Rutter, [place] 07/08/2016 1:42 PM

## 2016-07-09 MED ORDER — INFLUENZA VAC SPLIT QUAD 0.5 ML IM SUSY
0.5000 mL | PREFILLED_SYRINGE | INTRAMUSCULAR | Status: AC
  Administered 2016-07-10: 0.5 mL via INTRAMUSCULAR
  Filled 2016-07-09 (×2): qty 0.5

## 2016-07-09 NOTE — Progress Notes (Signed)
Pt awake, alert, oriented and up on unit today. Spends most of her day out of her room, in the day room interacting with peers. After lunch, pt reported by MHT to be bothering a female peer, invading his personal space even when he asked her not to. Pt requested the flu vaccine this morning, order placed. Pt also complained of constipation this morning, MOM given as ordered, but pt later on came back to complain that "the milk of magnesium you gave me made me have gas, and I'm embarrassed." Pt also requested chaplain, order placed and he did visit with her. Pt did attend group today. No symptoms of withdrawal reported and observed. Pt reports being undecided today about going to [facility] because she does not want to spend the time away from her cats. Pt continues to request flexeril and tramadol as ordered PRN every 6 hours for leg pain.   Support and encouragement provided with use of therapeutic communication. Medications administered as ordered with education. Safety maintained with every 15 minute checks. Will continue to monitor.

## 2016-07-09 NOTE — BHH Group Notes (Signed)
  [facility] LCSW Group Therapy Note  Date/Time: 07/09/2016, 9:30am  Type of Therapy/Topic:  Group Therapy:  Emotion Regulation  Participation Level:  Active (remains to same)  Mood: reports Good  Description of Group:    The purpose of this group is to assist patients in learning to regulate negative emotions and experience positive emotions. Patients will be guided to discuss ways in which they have been vulnerable to their negative emotions. These vulnerabilities will be juxtaposed with experiences of positive emotions or situations, and patients challenged to use positive emotions to combat negative ones. Special emphasis will be placed on coping with negative emotions in conflict situations, and patients will process healthy conflict resolution skills.  Therapeutic Goals: 1. Patient will identify two positive emotions or experiences to reflect on in order to balance out negative emotions:  2. Patient will label two or more emotions that they find the most difficult to experience:  3. Patient will be able to demonstrate positive conflict resolution skills through discussion or role plays:   Summary of Patient Progress:  Pt able to achieve above therapeutic goals.  Gave group some suggestions about how emotion regulation and imagery, deep breathing can be helpful to her, but had minimal insight about how she doesn't always utilize the skills when they could be helpful.   Therapeutic Modalities:   Cognitive Behavioral Therapy Feelings Identification Dialectical Behavioral Therapy  Dossie Arbour, MSW, LCSW

## 2016-07-09 NOTE — BHH Group Notes (Signed)
[facility] Group Notes:  (Nursing/MHT/Case Management/Adjunct)  Date:  07/09/2016  Time:  4:38 PM  Type of Therapy:  Psychoeducational Skills  Participation Level:  Active  Participation Quality:  Monopolizing and Redirectable  Affect:  Appropriate  Cognitive:  Appropriate  Insight:  Improving  Engagement in Group:  Monopolizing  Modes of Intervention:  Discussion and Education  Summary of Progress/Problems:  Charise Killian 07/09/2016, 4:38 PM

## 2016-07-09 NOTE — Progress Notes (Signed)
Chaplain received a consult to visit with pt in room 304. Pt was in the hospital for drug overdose and alcohol addiction. Pt was in decent spirits, but need some assistance. Provided the ministry of prayer and read scripture to the pt.    07/09/16 1130  Clinical Encounter Type  Visited With Patient  Visit Type Initial;Spiritual support  Referral From Nurse  Consult/Referral To [facility] text;Prayer

## 2016-07-09 NOTE — Progress Notes (Signed)
Patient ID: [name], female   DOB: 1955-10-11, 61 y.o.   MRN: [id number]  CSW spoke with Juliann Pulse at [facility] she is offering bed to patient for Friday January 19th. CSW confirmed with Pt that she was still willing to go and participate in programing and left voicemail for Juliann Pulse notifying her that we do want to claim that bed for Friday 1/19.  CSW notified Dr. Weber Cooks of plan and he is agreeable to disposition.  Dossie Arbour, LCSW

## 2016-07-09 NOTE — Progress Notes (Signed)
Patient ID: [name], female   DOB: [birth year], 61 y.o.   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

## 2016-07-09 NOTE — Plan of Care (Signed)
Problem: Safety: Goal: Ability to remain free from injury will improve Outcome: Progressing Pt free of harm this shift. Pt is able to complete ADLs independently. No falls. Will continue every 15 minute checks for safety.

## 2016-07-09 NOTE — Progress Notes (Signed)
Pt requesting chaplain, chaplain made aware. He is states he is on his way now.

## 2016-07-09 NOTE — Plan of Care (Signed)
Problem: [facility] Participation in Recreation Therapeutic Interventions Goal: STG-Patient will demonstrate improved self esteem by identif STG: Self-Esteem - Within 4 treatment sessions, patient will verbalize at least 5 positive affirmation statements in each of 2 treatment sessions to increase self-esteem post d/c.  Outcome: Progressing Treatment Session 1; Completed 1 out of 2: At approximately 2:00 pm, LRT met with patient in community room. Patient verbalized 5 positive affirmation statements. Patient reported it felt "good". LRT encouraged patient to continue saying positive affirmation statements.  Leonette Monarch, LRT/CTRS 01.17.18 2:14 pm Goal: STG-Other Recreation Therapy Goal (Specify) STG: Stress Management - Within 4 treatment sessions, patient will verbalize understanding of the stress management techniques in each of 2 treatment sessions to increase stress management skills post d/c.  Outcome: Progressing Treatment Session 1; Completed 1 out of 2: At approximately 2:00 pm, LRT met with patient in community room. LRT educated and provided patient with handouts on stress management techniques. Patient verbalized understanding. LRT encouraged patient to read over and practice the stress management techniques.  Leonette Monarch, LRT/CTRS 01.17.18 2:15 pm

## 2016-07-09 NOTE — Plan of Care (Signed)
Problem: Activity: Goal: Sleeping patterns will improve Outcome: Progressing Patient slept for Estimated Hours of 7.15; 15 minutes safety round maintained, no injury or falls during this shift.

## 2016-07-10 LAB — BASIC METABOLIC PANEL
Anion gap: 5 (ref 5–15)
BUN: 15 mg/dL (ref 6–20)
CALCIUM: 9.5 mg/dL (ref 8.9–10.3)
CO2: 33 mmol/L — ABNORMAL HIGH (ref 22–32)
CREATININE: 0.98 mg/dL (ref 0.44–1.00)
Chloride: 100 mmol/L — ABNORMAL LOW (ref 101–111)
GFR calc Af Amer: >60 mL/min (ref >60)
GFR calc non Af Amer: >60 mL/min (ref >60)
GLUCOSE: 92 mg/dL (ref 65–99)
Potassium: 4.2 mmol/L (ref 3.5–5.1)
Sodium: 138 mmol/L (ref 135–145)

## 2016-07-10 MED ORDER — BIOTENE DRY MOUTH MT LIQD: 15.0000 mL | OROMUCOSAL | Status: DC | PRN

## 2016-07-10 MED ORDER — SENNOSIDES-DOCUSATE SODIUM 8.6-50 MG PO TABS: 2.0000 | ORAL_TABLET | Freq: Three times a day (TID) | ORAL | Status: DC | PRN

## 2016-07-10 MED ORDER — SIMETHICONE 40 MG/0.6ML PO SUSP
40.0000 mg | Freq: Four times a day (QID) | ORAL | Status: DC | PRN
  Administered 2016-07-10: 40 mg via ORAL
  Filled 2016-07-10 (×4): qty 0.6

## 2016-07-10 NOTE — BHH Group Notes (Signed)
[facility] Group Notes:  (Nursing/MHT/Case Management/Adjunct)  Date:  07/10/2016  Time:  6:13 PM  Type of Therapy:  Psychoeducational Skills  Participation Level:  Active  Participation Quality:  Appropriate  Affect:  Appropriate  Cognitive:  Appropriate  Insight:  Appropriate  Engagement in Group:  Engaged  Modes of Intervention:  Activity  Summary of Progress/Problems:  Drake Leach 07/10/2016, 6:13 PM

## 2016-07-10 NOTE — Plan of Care (Signed)
Problem: [name] Participation in Recreation Therapeutic Interventions Goal: STG-Patient will demonstrate improved self esteem by identif STG: Self-Esteem - Within 4 treatment sessions, patient will verbalize at least 5 positive affirmation statements in each of 2 treatment sessions to increase self-esteem post d/c.  Outcome: Completed/Met Date Met: 07/10/16 Treatment Session 2; Completed 2 out of 2: At approximately 11:35 am, LRT met with patient in craft room. Patient verbalized 5 positive affirmation statements. Patient reported it felt "good" and patient said it made her smile. LRT encouraged patient to continue saying positive affirmation statements.  Leonette Monarch, LRT/CTRS 01.18.18 11:50 am Goal: STG-Other Recreation Therapy Goal (Specify) STG: Stress Management - Within 4 treatment sessions, patient will verbalize understanding of the stress management techniques in each of 2 treatment sessions to increase stress management skills post d/c.  Outcome: Completed/Met Date Met: 07/10/16 Treatment Session 2; Completed 2 out of 2: At approximately 11:35 am, LRT met with patient in craft room. Patient reported she read over and practiced the stress management techniques. Patient verbalized understanding and reported the techniques were helpful. LRT encouraged patient to continue practicing the stress management techniques.  Leonette Monarch, LRT/CTRS 01.18.18 11:51 am

## 2016-07-10 NOTE — Progress Notes (Signed)
07/10/16 [place], daughter. She is not available to transport pt to [facility] today or tomorrow.  Her mother has a bag of clothing packed at her apartment that she is needing in order to be in the program and this needs to be picked up. Lurline Idol, LCSW

## 2016-07-10 NOTE — Plan of Care (Signed)
Problem: Coping: Goal: Ability to cope will improve Outcome: Progressing Encourage  To work on Radiographer, therapeutic

## 2016-07-10 NOTE — Progress Notes (Signed)
Patient ID: [name], female   DOB: 1956/05/05, 61 y.o.   MRN: [id number] Pleasant on approach, quiet, sad, but visible in the day area interacting well with peers; c/o 7/10 legs pain, flexril 10 mg and Tramadol 50 mg given with relief. Patient anticipating discharge to [facility] for treatment.

## 2016-07-10 NOTE — Progress Notes (Signed)
Recreation Therapy Notes  Date: 01.18.18 Time: 1:00 pm Location: Craft Room  Group Topic: Leisure Education  Goal Area(s) Addresses:  Patient will identify activities for each letter of the alphabet. Patient will verbalize ability to integrate positive leisure into life post d/c. Patient will verbalize ability to use leisure as a Technical sales engineer.  Behavioral Response: Attentive, Interactive  Intervention: Leisure Education  Activity: Patients were given a Leisure Air traffic controller. Patients were instructed to write leisure activities for each letter of the alphabet.  Education: LRT educated patients on leisure and why it is important.  Education Outcome: Acknowledges education/In group clarification offered   Clinical Observations/Feedback: Patient wrote healthy leisure activities. Patient contributed to group discussion by stating some of her healthy leisure activities, and how she can put leisure back in her schedule.  Leonette Monarch, LRT/CTRS 07/10/2016 2:03 PM

## 2016-07-10 NOTE — Plan of Care (Signed)
Problem: Activity: Goal: Sleeping patterns will improve Outcome: Progressing Patient slept for Estimated Hours of 7; every 15 minutes safety rounds maintained, no injury or falls during this shift.

## 2016-07-10 NOTE — Progress Notes (Signed)
[facility] MD Progress Note  07/10/2016 5:09 PM [name]  MRN:  [id number] Subjective:  "My back and legs hurt". Patient's chief complaint today is her pain. Mood is still a little bit down but is improving. Denies any suicidal thoughts. Patient is having some jitteriness but is much more stable on her feet and tolerating detox better. This is a 61 year old woman with a history of alcohol dependence depression chronic pain chronic medical problems who comes into the hospital after a suicide attempt.  Follow-up for January 80. 61 year old woman with a history of depression and alcohol abuse. Patient states that her mood is feeling better today. Still has anxiety attacks but is not having suicidal thoughts. She is still having some feelings of mild subjective alcohol withdrawal but her vital signs are stable and she is stable on her feet and eating and drinking well. Goes to groups. Working with treatment team on a appropriate  Follow-up for January 16. Patient seen and met with treatment team. Patient remains overly focused on her pain issues and her medications. Affect is brighter. Physically much more stable. Still agrees to go to the alcohol and drug abuse treatment program. Attending some groups. Responds reasonably well to redirection  Follow-up for Thursday the 18th. Patient has no new complaints. She had been accepted to go to the alcohol and drug abuse treatment center tomorrow. She now tells me that she does not want to go because her daughter is unable to come up and bring her any clothing or toiletries. Patient is adamant that without these things she will not agree to go to the alcohol and drug abuse treatment center. Her mood seems to of improved. As previously she is mostly focused on her somatic complaints including such relatively minor things as her dry mouth. Denies suicidal thoughts. Patient is neatly groomed and generally cooperative with treatment..  Principal Problem: Severe recurrent  major depression without psychotic features ([place]) Diagnosis:   Patient Active Problem List   Diagnosis Date Noted  . Overdose of antidepressant [T43.201A] 07/03/2016  . 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 Severe recurrent major depression without psychotic features ([place]) [F33.2] 07/03/2015  . Alcohol use disorder, severe, dependence ([place]) [F10.20] 01/26/2015  . Tobacco use disorder [F17.200] 01/26/2015  . Alcohol withdrawal ([place]) [F10.239] 01/08/2015  . 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 GAD (generalized anxiety disorder) [F41.1] 12/24/2013  . PTSD (post-traumatic stress disorder) [F43.10] 12/22/2013   Total Time spent with patient: 30 minutes  Past Psychiatric History: Patient has a long history of depression and alcohol abuse. This is possibly her most serious suicide attempt.  Past Medical History:  Past Medical History:  Diagnosis Date  . Anxiety   . Anxiety    panic anxiety disorder  . Arthritis   . Asthma    chronic asthmatic bronchitis  . Cancer [name])    Desmoid tumor left forearm  . Depression   . ETOH abuse   . Heart murmur   . Insomnia   . Neuropathy [facility])     Past Surgical History:  Procedure Laterality Date  . ABDOMINAL HYSTERECTOMY    . ABDOMINAL HYSTERECTOMY  2007  . 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 ARM AMPUTATION AT SHOULDER Left   . CESAREAN SECTION    . [place]   x 2  . JOINT REPLACEMENT Right 2012   hip  . LUMBAR LAMINECTOMY/DECOMPRESSION MICRODISCECTOMY Left  04/16/2016   Procedure: LUMBAR LAMINECTOMY/DECOMPRESSION MICRODISCECTOMY 1 LEVEL;  Surgeon: Venetia Night, MD;  Location: [facility] ORS;  Service: Neurosurgery;  Laterality: Left;  Left L4-5 far lateral discectomy, left L4-5 laminoforaminotomy  . TOTAL HIP ARTHROPLASTY Left 09/17/2015   Procedure: TOTAL HIP ARTHROPLASTY;  Surgeon: Donato Heinz, MD;  Location: [facility] ORS;  Service:  Orthopedics;  Laterality: Left;   Family History:  Family History  Problem Relation Age of Onset  . COPD Mother   . Alcoholism Father   . COPD Father    Family Psychiatric  History: Positive for alcohol abuse and depression Social History:  History  Alcohol Use  . Yes    Comment: 12 pack beer per day for last week     History  Drug Use No    Comment: Patient denies     Social History   Social History  . Marital status: Divorced    Spouse name: N/A  . Number of children: N/A  . Years of education: N/A   Social History Main Topics  . Smoking status: Current Every Day Smoker    Packs/day: 0.25    Types: Cigarettes  . Smokeless tobacco: Never Used  . Alcohol use Yes     Comment: 12 pack beer per day for last week  . Drug use: No     Comment: Patient denies   . Sexual activity: Yes   Other Topics Concern  . None   Social History Narrative   ** Merged History Encounter **       Additional Social History:                         Sleep: Fair  Appetite:  Fair  Current Medications: Current Facility-Administered Medications  Medication Dose Route Frequency Provider Last Rate Last Dose  . acetaminophen (TYLENOL) tablet 650 mg  650 mg Oral Q6H PRN Audery Amel, MD   650 mg at 07/06/16 4975  . alum & mag hydroxide-simeth (MAALOX/MYLANTA) 200-200-20 MG/5ML suspension 30 mL  30 mL Oral Q4H PRN Audery Amel, MD      . antiseptic oral rinse (BIOTENE) solution 15 mL  15 mL Mouth Rinse PRN Audery Amel, MD      . cyclobenzaprine (FLEXERIL) tablet 10 mg  10 mg Oral Q6H PRN Audery Amel, MD   10 mg at 07/10/16 0840  . folic acid (FOLVITE) tablet 1 mg  1 mg Oral Daily Audery Amel, MD   1 mg at 07/10/16 0834  . gabapentin (NEURONTIN) capsule 900 mg  900 mg Oral TID Audery Amel, MD   900 mg at 07/10/16 1230  . hydrOXYzine (ATARAX/VISTARIL) tablet 50 mg  50 mg Oral TID PRN Audery Amel, MD      . loperamide (IMODIUM) capsule 2 mg  2 mg Oral PRN Audery Amel, MD      . magnesium hydroxide (MILK OF MAGNESIA) suspension 30 mL  30 mL Oral Daily PRN Audery Amel, MD   30 mL at 07/09/16 0850  . multivitamin with minerals tablet 1 tablet  1 tablet Oral Daily Audery Amel, MD   1 tablet at 07/10/16 0834  . propranolol (INDERAL) tablet 10 mg  10 mg Oral TID Audery Amel, MD   10 mg at 07/10/16 1230  . senna-docusate (Senokot-S) tablet 2 tablet  2 tablet Oral Q8H PRN Audery Amel, MD      . simethicone (MYLICON) 40  MG/0.6ML suspension 40 mg  40 mg Oral Q6H PRN Gonzella Lex, MD      . terbinafine (LAMISIL) 1 % cream   Topical BID Gonzella Lex, MD      . thiamine (VITAMIN B-1) tablet 100 mg  100 mg Oral Daily Gonzella Lex, MD   100 mg at 07/10/16 0835  . traMADol (ULTRAM) tablet 50 mg  50 mg Oral Q6H PRN Gonzella Lex, MD   50 mg at 07/10/16 0841  . traZODone (DESYREL) tablet 200 mg  200 mg Oral QHS Gonzella Lex, MD   200 mg at 07/09/16 2128  . venlafaxine XR (EFFEXOR-XR) 24 hr capsule 150 mg  150 mg Oral Q breakfast Gonzella Lex, MD   150 mg at 07/10/16 0835  . white petrolatum (VASELINE) gel   Topical PRN Gonzella Lex, MD        Lab Results:  No results found for this or any previous visit (from the past 48 hour(s)).  Blood Alcohol level:  Lab Results  Component Value Date   ETH 49 (H) 07/02/2016   ETH <5 41/66/0630    Metabolic Disorder Labs: Lab Results  Component Value Date   HGBA1C 5.0 07/05/2016   MPG 97 07/05/2016   No results found for: PROLACTIN Lab Results  Component Value Date   CHOL 215 (H) 07/05/2016   TRIG 131 07/05/2016   HDL 53 07/05/2016   CHOLHDL 4.1 07/05/2016   VLDL 26 07/05/2016   LDLCALC 136 (H) 07/05/2016   LDLCALC 116 (H) 07/05/2015    Physical Findings: AIMS:  , ,  ,  ,    CIWA:  CIWA-Ar Total: 1 COWS:     Musculoskeletal: Strength & Muscle Tone: within normal limits Gait & Station: broad based Patient leans: N/A  Psychiatric Specialty Exam: Physical Exam  Nursing note and  vitals reviewed. Constitutional: She appears well-developed and well-nourished.  HENT:  Head: Normocephalic and atraumatic.  Eyes: Conjunctivae are normal. Pupils are equal, round, and reactive to light.  Neck: Normal range of motion.  Cardiovascular: Regular rhythm and normal heart sounds.   Respiratory: Effort normal.  GI: Soft.  Musculoskeletal: Normal range of motion.       Arms: Neurological: She is alert.  Skin: Skin is warm and dry.  Psychiatric: Her speech is normal and behavior is normal. Judgment normal. Her mood appears anxious. She is not slowed. Thought content is not paranoid. Cognition and memory are normal. She expresses no suicidal ideation.    Review of Systems  Constitutional: Negative.   HENT: Negative.   Eyes: Negative.   Respiratory: Negative.   Cardiovascular: Negative.   Gastrointestinal: Negative.   Musculoskeletal: Positive for back pain and joint pain.  Skin: Negative.   Neurological: Negative.   Psychiatric/Behavioral: Negative for depression, hallucinations, substance abuse and suicidal ideas. The patient is nervous/anxious.     Blood pressure (!) 130/55, pulse 61, temperature 98.2 F (36.8 C), resp. rate 18, height '5\' 5"'$  (1.651 m), weight 65.8 kg (145 lb), SpO2 99 %.Body mass index is 24.13 kg/m.  General Appearance: Disheveled  Eye Contact:  Fair  Speech:  Slow  Volume:  Decreased  Mood:  Anxious  Affect:  Blunt  Thought Process:  Goal Directed  Orientation:  Full (Time, Place, and Person)  Thought Content:  Logical  Suicidal Thoughts:  No  Homicidal Thoughts:  No  Memory:  Immediate;   Good Recent;   Fair Remote;   Fair  Judgement:  Fair  Insight:  Fair  Psychomotor Activity:  Decreased  Concentration:  Concentration: Fair  Recall:  [employer] of Knowledge:  Fair  Language:  Fair  Akathisia:  No  Handed:  Right  AIMS (if indicated):     Assets:  Communication Skills Desire for Improvement Housing Resilience  ADL's:  Intact    Cognition:  WNL  Sleep:  Number of Hours: 7     Treatment Plan Summary: Daily contact with patient to assess and evaluate symptoms and progress in treatment, Medication management and Plan Patient appears to of essentially returned to her baseline. No longer having any symptoms of alcohol withdrawal. Mood is baseline without any suicidal thought intent or plan. Patient is now stating she does not want to go to the alcohol and drug abuse treatment center until her daughter can bring her clothing. We have no idea when that would happen maybe not even next week. Under the circumstances it seems the most reasonable thing to do is to discharge her home with local follow-up. Patient is agreeable to this. Case reviewed with nursing. We will plan for likely discharge tomorrow.  Alethia Berthold, MD 07/10/2016, 5:09 PM

## 2016-07-10 NOTE — Progress Notes (Signed)
D: Patient stated slept good last night .Stated appetite is good and energy level  Is normal. Stated concentration is good . Stated on Depression scale3 , hopeless 3 and anxiety 5  .( low 0-10 high) Denies suicidal  homicidal ideations  .  No auditory hallucinations  No pain concerns . Appropriate ADL'S. Interacting with peers and staff.  Continue to vioice of withdrawal symptoms voice of getting organized for [facility] but later in day stated she didn't want to go. A: Encourage patient participation with unit programming . Instruction  Given on  Medication , verbalize understanding. R: Voice no other concerns. Staff continue to monitor

## 2016-07-10 NOTE — BHH Group Notes (Signed)
[place] LCSW Group Therapy Note  Date/Time: 07/10/16 0900  Type of Therapy/Topic:  Group Therapy:  Balance in Life  Participation Level:  active  Description of Group:    This group will address the concept of balance and how it feels and looks when one is unbalanced. Patients will be encouraged to process areas in their lives that are out of balance, and identify reasons for remaining unbalanced. Facilitators will guide patients utilizing problem- solving interventions to address and correct the stressor making their life unbalanced. Understanding and applying boundaries will be explored and addressed for obtaining  and maintaining a balanced life. Patients will be encouraged to explore ways to assertively make their unbalanced needs known to significant others in their lives, using other group members and facilitator for support and feedback.  Therapeutic Goals: 1. Patient will identify two or more emotions or situations they have that consume much of in their lives. 2. Patient will identify signs/triggers that life has become out of balance:  3. Patient will identify two ways to set boundaries in order to achieve balance in their lives:  4. Patient will demonstrate ability to communicate their needs through discussion and/or role plays  Summary of Patient Progress: Pt shared that she has allowed the spiritual side of her life to decrease to the point that it affected her recovery and she relapsed.  She also is losing her support system as her daughter is leaving this month to [place] in [place] and her son moved to [place] not long ago.  Pt also was active in giving feedback to other group members.    Therapeutic Modalities:   Cognitive Behavioral Therapy Solution-Focused Therapy Assertiveness Training  Lurline Idol, [place]

## 2016-07-11 MED ORDER — GABAPENTIN 300 MG PO CAPS: 900.0000 mg | ORAL_CAPSULE | Freq: Three times a day (TID) | ORAL | 0 refills | Status: DC

## 2016-07-11 MED ORDER — CYCLOBENZAPRINE HCL 10 MG PO TABS: 10.0000 mg | ORAL_TABLET | Freq: Four times a day (QID) | ORAL | 0 refills | Status: DC | PRN

## 2016-07-11 MED ORDER — VENLAFAXINE HCL ER 150 MG PO CP24: 150.0000 mg | ORAL_CAPSULE | Freq: Every day | ORAL | 0 refills | Status: DC

## 2016-07-11 MED ORDER — TRAZODONE HCL 100 MG PO TABS
200.0000 mg | ORAL_TABLET | Freq: Every day | ORAL | 0 refills | Status: DC
Start: 2016-07-11 — End: 2017-05-21

## 2016-07-11 MED ORDER — PROPRANOLOL HCL 10 MG PO TABS: 10.0000 mg | ORAL_TABLET | Freq: Three times a day (TID) | ORAL | 0 refills | Status: DC

## 2016-07-11 NOTE — Progress Notes (Signed)
Recreation Therapy Notes  Date: 01.19.18 Time: 1:00 pm Location: Craft Room  Group Topic: Social Skills  Goal Area(s) Addresses:  Patient will effectively work with peer towards shared goal. Patient will identify skills used to make activities successful. Patient will identify benefit of using group skills effectively post d/c.  Behavioral Response: Attentive, Interactive  Intervention: [employer]  Activity: Patients divided into group. Patients were given 15 pipe cleaners and were instructed to build a free standing tower. Patients were given 2 minutes to strategize. After about 5 minutes of building, patients were instructed to put their dominant hand behind their back. After about 5 more minutes, patients were instructed to stop talking to each other.  Education: LRT educated patients on healthy support systems.  Education Outcome: In group clarification offered   Clinical Observations/Feedback: Patient worked with peer to build tower. Patient initially used effective communication, problem solving, and teamwork. Patient was frustrated and quit several times. Patient laid her head on the table during group discussion. Patient was upset about leaving and stated she had not learned anything she did not already know.  Leonette Monarch, LRT/CTRS 07/11/2016 2:10 PM

## 2016-07-11 NOTE — Progress Notes (Signed)
[place] rounding unit visited with the Pt. CH met with Pt. Pt stated she was to be discharged today, but had not prepared to go to the Rehab. Pt is working on her mental and spiritual health that she believes would help her get back on track with her life. She has a plan that will help her get over the alcohol life. Pt requested for prayers, which the [name] provided.     07/11/16 1300  Clinical Encounter Type  Visited With Patient  Visit Type Follow-up;Psychological support;Spiritual support  Referral From Nurse  Consult/Referral To Chaplain  Spiritual Encounters  Spiritual Needs Prayer;Other (Comment)

## 2016-07-11 NOTE — BHH Group Notes (Signed)
[place] LCSW Group Therapy   07/11/2016 9:30 AM   Type of Therapy: Group Therapy   Participation Level: Active   Participation Quality: Attentive, Sharing and Supportive   Affect: Appropriate   Cognitive: Alert and Oriented   Insight: Developing/Improving and Engaged   Engagement in Therapy: Developing/Improving and Engaged   Modes of Intervention: Clarification, Confrontation, Discussion, Education, Exploration, Limit-setting, Orientation, Problem-solving, Rapport Building, Art therapist, Socialization and Support   Summary of Progress/Problems: The topic for today was feelings about relapse. Pt discussed what relapse prevention is to them and identified triggers that they are on the path to relapse. Pt processed their feeling towards relapse and was able to relate to peers. Pt discussed coping skills that can be used for relapse prevention. Pt defined relapse as "being unstable." She stated that symptoms such as isolation and anxiety becomes difficult to ignore during a relapse. Pt identified meditation and breathing techniques as ways to reduce relapse.   [name], MSW, LCSWA 07/11/2016, 3:22PM

## 2016-07-11 NOTE — Progress Notes (Signed)
D: Patient denies SI/HI/AVH.  Patient affect is appropriate and her mood is depressed.   Patient visible on the milieu. Patient reports having 1 bowel movement tonight.  No distress noted. A: Support and encouragement offered. Scheduled medications given to pt. Q 15 min checks continued for patient safety. R: Patient receptive. Patient remains safe on the unit.

## 2016-07-11 NOTE — Progress Notes (Signed)
D:Patient aware of discharge this shift . Patient returning home . Patient received all belonging locked up . Patient denies  Suicidal  And homicidal ideations  .  A: Writer instructed on discharge criteria  . Informed of Discharge Summary, Suicide Risk Assessment, Transitional Record   and prescriptions  given to patient . Aware  Of follow up appointment . R: Patient left unit with no questions  Or concerns  Left via taxi

## 2016-07-11 NOTE — Progress Notes (Signed)
Patient ID: [name], female   DOB: 1955-10-13, 61 y.o.   MRN: [id number]  [place] faxed additional labs needs by [place] on 07/10/2016 for admissions to further review patient's referral. CSW follow-up today 07/11/2016 with Juliann Pulse from Admissions, stated a decision would be made by the end of the day about patient's admission date.  Fabrizio Filip G. [place], [place] 07/11/2016 11:53 AM

## 2016-07-11 NOTE — BHH Group Notes (Signed)
Goals Group: late entry Date/Time: 07/10/17 9:00 AM Type of Therapy and Topic: Group Therapy: Goals Group: SMART Goals   Participation Level: Moderate  Description of Group:    The purpose of a daily goals group is to assist and guide patients in setting recovery/wellness-related goals. The objective is to set goals as they relate to the crisis in which they were admitted. Patients will be using SMART goal modalities to set measurable goals. Characteristics of realistic goals will be discussed and patients will be assisted in setting and processing how one will reach their goal. Facilitator will also assist patients in applying interventions and coping skills learned in psycho-education groups to the SMART goal and process how one will achieve defined goal.   Therapeutic Goals:   -Patients will develop and document one goal related to or their crisis in which brought them into treatment.  -Patients will be guided by LCSW using SMART goal setting modality in how to set a measurable, attainable, realistic and time sensitive goal.  -Patients will process barriers in reaching goal.  -Patients will process interventions in how to overcome and successful in reaching goal.   Patient's Goal: Pt reported "I'm taking a day off from goals.  I'm too frustrated with my current plan."  This was briefly processed with pt.   Therapeutic Modalities:  Motivational Interviewing  Art gallery manager  SMART goals setting  Lurline Idol, [place]

## 2016-07-11 NOTE — Progress Notes (Signed)
Recreation Therapy Notes  INPATIENT RECREATION TR PLAN  Patient Details Name: [name] MRN: [id number] DOB: 1956/04/21 Today's Date: 07/11/2016  Rec Therapy Plan Is patient appropriate for Therapeutic Recreation?: Yes Treatment times per week: At least once a week TR Treatment/Interventions: 1:1 session, Group participation (Comment) (Appropriate participation in daily recreational therapy tx)  Discharge Criteria Pt will be discharged from therapy if:: Treatment goals are met, Discharged Treatment plan/goals/alternatives discussed and agreed upon by:: Patient/family  Discharge Summary Short term goals set: See Care Plan Short term goals met: Complete Progress toward goals comments: One-to-one attended Which groups?: Wellness, Other (Comment), Leisure education, Social skills (Self-expression) One-to-one attended: Stress management, self-esteem Reason goals not met: N/A Therapeutic equipment acquired: None Reason patient discharged from therapy: Discharge from hospital Pt/family agrees with progress & goals achieved: Yes Date patient discharged from therapy: 07/11/16   Leonette Monarch, LRT/CTRS 07/11/2016, 4:29 PM

## 2016-07-11 NOTE — Tx Team (Signed)
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 acetaminophen (TYLENOL) tablet 650 mg  650 mg Oral Q6H PRN Gonzella Lex, MD   650 mg at 07/06/16 [id number]  . alum & mag hydroxide-simeth (MAALOX/MYLANTA) 200-200-20 MG/5ML suspension 30 mL  30 mL Oral Q4H PRN Gonzella Lex, MD      . antiseptic oral rinse (BIOTENE) solution 15 mL  15 mL Mouth Rinse PRN Gonzella Lex, MD      . cyclobenzaprine (FLEXERIL) tablet 10 mg  10 mg Oral Q6H PRN Gonzella Lex, MD   10 mg at 07/11/16 0849  . folic acid (FOLVITE) tablet 1 mg  1 mg Oral Daily Gonzella Lex, MD   1 mg at 07/11/16 0809  . gabapentin (NEURONTIN) capsule 900 mg  900 mg Oral TID Gonzella Lex, MD   900 mg at 07/11/16 1247  . hydrOXYzine (ATARAX/VISTARIL) tablet 50 mg  50 mg Oral TID PRN Gonzella Lex, MD      . loperamide (IMODIUM) capsule 2 mg  2 mg Oral PRN Gonzella Lex, MD      . magnesium hydroxide (MILK OF MAGNESIA) suspension 30 mL  30 mL Oral Daily PRN Gonzella Lex, MD   30 mL at 07/10/16 1819  . multivitamin with minerals tablet 1 tablet  1 tablet Oral Daily Gonzella Lex, MD   1 tablet at 07/11/16 [phone]  . propranolol (INDERAL) tablet 10 mg  10 mg Oral TID Gonzella Lex, MD   10 mg at 07/11/16 1246  . senna-docusate (Senokot-S) tablet 2 tablet  2 tablet Oral Q8H PRN Gonzella Lex, MD      . simethicone (MYLICON) 40 [id number] suspension 40 mg  40 mg Oral Q6H PRN Gonzella Lex, MD   40 mg at 07/10/16 2154  . terbinafine (LAMISIL) 1 % cream   Topical BID Gonzella Lex, MD      . thiamine (VITAMIN B-1) tablet 100 mg  100 mg Oral Daily Gonzella Lex, MD   100 mg at 07/11/16 [id number]  . traMADol (ULTRAM) tablet 50 mg  50 mg Oral Q6H PRN Gonzella Lex, MD   50 mg at 07/11/16 0849  . traZODone (DESYREL) tablet 200 mg  200 mg Oral QHS Gonzella Lex, MD   200 mg at 07/10/16 2142  . venlafaxine XR (EFFEXOR-XR) 24 hr capsule 150 mg  150 mg Oral Q breakfast Gonzella Lex, MD   150 mg at 07/11/16 [id number]  . white petrolatum (VASELINE) gel   Topical PRN Gonzella Lex, MD       PTA Medications: Prescriptions Prior to Admission  Medication Sig Dispense Refill Last Dose  . chlordiazePOXIDE (LIBRIUM) 10 MG capsule Take 1 capsule (10 mg total) by mouth 3 (three) times daily as needed for anxiety or withdrawal. 6 capsule 0 unknown at unknown  . gabapentin (NEURONTIN) 300 MG capsule Take 3  capsules (900 mg total) by mouth 3 (three) times daily with meals. (Patient taking differently: Take 600 mg by mouth 3 (three) times daily with meals. ) 270 capsule 0 unknown at unknown  . HYDROcodone-acetaminophen (NORCO) 5-325 MG tablet Take 1 tablet by mouth every 6 (six) hours as needed for moderate pain. 30 tablet 0 unknown at unknown  . ondansetron (ZOFRAN ODT) 4 MG disintegrating tablet Take 1 tablet (4 mg total) by mouth every 8 (eight) hours as needed for nausea or vomiting. 20 tablet 0 unknown at unknown  . propranolol (INDERAL) 10 MG tablet Take 1 tablet (10 mg total) by mouth 3 (three) times daily with meals. 90 tablet 0 unknown at unknown  . tiZANidine (ZANAFLEX) 4 MG tablet Take 1 tablet (4 mg total) by mouth every 6 (six) hours as needed for muscle spasms. 30 tablet 0 unknown at unknown  . traZODone (DESYREL) 150 MG tablet Take 1 tablet (150 mg total) by mouth at bedtime. (Patient taking  differently: Take 150-300 mg by mouth at bedtime as needed. ) 30 tablet 0 unknown at unknown  . venlafaxine XR (EFFEXOR-XR) 150 MG 24 hr capsule Take 2 capsules (300 mg total) by mouth daily with breakfast. 60 capsule 0 unknown at unknown    Patient Stressors: Substance abuse  Patient Strengths: Capable of independent living Agricultural engineer for treatment/growth Supportive family/friends  Treatment Modalities: Medication Management, Group therapy, Case management,  1 to 1 session with clinician, Psychoeducation, Recreational therapy.   Physician Treatment Plan for Primary Diagnosis: Severe recurrent major depression without psychotic features ([place]) Long Term Goal(s): Improvement in symptoms so as ready for discharge Improvement in symptoms so as ready for discharge   Short Term Goals: Ability to verbalize feelings will improve Ability to disclose and discuss suicidal ideas Ability to identify and develop effective coping behaviors will improve Compliance with prescribed medications will improve  Medication Management: Evaluate patient's response, side effects, and tolerance of medication regimen.  Therapeutic Interventions: 1 to 1 sessions, Unit Group sessions and Medication administration.  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  Therapeutic Interventions: 1 to 1 sessions, Unit Group sessions and Medication administration.  Evaluation of Outcomes: Adequate for Discharge   RN Treatment Plan for Primary Diagnosis: Severe recurrent major depression without psychotic features ([place]) Long Term Goal(s): Knowledge of disease and therapeutic regimen to maintain health will improve  Short Term Goals: Ability to remain free from injury will improve, Ability to verbalize frustration and anger appropriately will improve, Ability to demonstrate self-control, Ability to disclose and discuss suicidal ideas and Compliance with prescribed medications will improve  Medication Management: RN will administer medications as ordered by provider, will assess and evaluate patient's response and provide education to patient for  prescribed medication. RN will report any adverse and/or side effects to prescribing provider.  Therapeutic Interventions: 1 on 1 counseling sessions, Psychoeducation, Medication administration, Evaluate responses to treatment, Monitor vital signs and CBGs as ordered, Perform/monitor CIWA, COWS, AIMS and Fall Risk screenings as ordered, Perform wound care treatments as ordered.  Evaluation of Outcomes: Adequate for Discharge   LCSW Treatment Plan for Primary Diagnosis: Severe recurrent major depression without psychotic features ([place]) Long Term Goal(s): Safe transition to appropriate next level of care at discharge, Engage patient in therapeutic group addressing interpersonal concerns.  Short Term Goals: Engage patient in aftercare planning with referrals and resources, Increase social support, Increase emotional regulation, Identify triggers associated with mental health/substance abuse issues and  Increase skills for wellness and recovery  Therapeutic Interventions: Assess for all discharge needs, 1 to 1 time with Social worker, Explore available resources and support systems, Assess for adequacy in community support network, Educate family and significant other(s) on suicide prevention, Complete Psychosocial Assessment, Interpersonal group therapy.  Evaluation of Outcomes: Adequate for Discharge   Progress in Treatment: Attending groups: Yes. Participating in groups: Yes. Taking medication as prescribed: Yes. Toleration medication: Yes. Family/Significant other contact made: Yes, individual(s) contacted:  daughter Patient understands diagnosis: Yes. Discussing patient identified problems/goals with staff: Yes. Medical problems stabilized or resolved: Yes. Denies suicidal/homicidal ideation: Yes. Issues/concerns per patient self-inventory: No. Other: n/a  New problem(s) identified: None identified at this time.   New Short Term/Long Term Goal(s): None identified at this time.   Discharge Plan or Barriers: Patient will discharge home and follow-up with [facility] for outpatient services.   Reason for Continuation of Hospitalization: Anticipated discharge 07/11/2016  Estimated Length of Stay: Anticipated discharge 07/11/2016  Attendees: Patient: [name] 07/11/2016 1:39 PM  Physician: Alethia Berthold. MD 07/11/2016 1:39 PM  Nursing: Polly Cobia, RN 07/11/2016 1:39 PM  RN Care Manager: 07/11/2016 1:39 PM  Social Worker: Jen Mow. Satira Sark 07/11/2016 1:39 PM  Recreational Therapist: Leonette Monarch, LRT/CTRS 07/11/2016 1:39 PM  Other:  07/11/2016 1:39 PM  Other:  07/11/2016 1:39 PM  Other: 07/11/2016 1:39 PM    Scribe for Treatment Team: Jolaine Click, LCSWA 07/11/2016 1:46 PM

## 2016-07-11 NOTE — Progress Notes (Signed)
Patient ID: [name], female   DOB: August 02, 1955, 61 y.o.   MRN: [id number]  CSW received returned called from [place] in Admissions at [place]. Patient was accepted in the program for Monday 07/14/2016. However, patient is now stating she does not want to go to inpatient treatment. CSW scheduled follow-up with [facility] for outpatient services.  [name] G. Claybon Jabs MSW, [facility] 07/11/2016 12:28 PM

## 2016-07-11 NOTE — Discharge Summary (Signed)
Physician Discharge Summary Note  Patient:  [name] is an 61 y.o., female MRN:  [id number] DOB:  October 10, 1955 Patient phone:  [phone] (home)  Patient address:   [address] [place] [address],  Total Time spent with patient: 45 minutes  Date of Admission:  07/05/2016 Date of Discharge: 07/11/2016  Reason for Admission:  Patient was admitted through the emergency room because of alcohol abuse with detox and return of major depression with suicidal thinking as well as overdose of prescription medicine with suicidal intent  Principal Problem: Severe recurrent major depression without psychotic features [facility]) Discharge Diagnoses: Patient Active Problem List   Diagnosis Date Noted  . Overdose of antidepressant [T43.201A] 07/03/2016  . 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 Severe recurrent major depression without psychotic features ([place]) [F33.2] 07/03/2015  . Alcohol use disorder, severe, dependence ([place]) [F10.20] 01/26/2015  . Tobacco use disorder [F17.200] 01/26/2015  . Alcohol withdrawal ([place]) [F10.239] 01/08/2015  . 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 GAD (generalized anxiety disorder) [F41.1] 12/24/2013  . PTSD (post-traumatic stress disorder) [F43.10] 12/22/2013    Past Psychiatric History: Patient has a past history of recurrent depressions suicide attempts and alcohol abuse.  Past Medical History:  Past Medical History:  Diagnosis Date  . Anxiety   . Anxiety    panic anxiety disorder  . Arthritis   . Asthma    chronic asthmatic bronchitis  . Cancer [facility])    Desmoid tumor left forearm  . Depression   . ETOH abuse   . Heart murmur   . Insomnia   . Neuropathy [facility])     Past Surgical History:  Procedure Laterality Date  . ABDOMINAL HYSTERECTOMY    . ABDOMINAL HYSTERECTOMY  2007  . 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 ARM AMPUTATION AT SHOULDER Left   . CESAREAN SECTION     . [place]   x 2  . JOINT REPLACEMENT Right 2012   hip  . LUMBAR LAMINECTOMY/DECOMPRESSION MICRODISCECTOMY Left 04/16/2016   Procedure: LUMBAR LAMINECTOMY/DECOMPRESSION MICRODISCECTOMY 1 LEVEL;  Surgeon: Meade Maw, MD;  Location: [facility] ORS;  Service: Neurosurgery;  Laterality: Left;  Left L4-5 far lateral discectomy, left L4-5 laminoforaminotomy  . TOTAL HIP ARTHROPLASTY Left 09/17/2015   Procedure: TOTAL HIP ARTHROPLASTY;  Surgeon: Dereck Leep, MD;  Location: [facility] ORS;  Service: Orthopedics;  Laterality: Left;   Family History:  Family History  Problem Relation Age of Onset  . COPD Mother   . Alcoholism Father   . COPD Father    Family Psychiatric  History: Negative Social History:  History  Alcohol Use  . Yes    Comment: 12 pack beer per day for last week     History  Drug Use No    Comment: Patient denies     Social History   Social History  . Marital status: Divorced    Spouse name: N/A  . Number of children: N/A  . Years of education: N/A   Social History Main Topics  . Smoking status: Current Every Day Smoker    Packs/day: 0.25    Types: Cigarettes  . Smokeless tobacco: Never Used  . Alcohol use Yes     Comment: 12 pack beer per day for last week  . Drug use: No     Comment: Patient denies   . Sexual activity: Yes   Other Topics Concern  .  None   Social History Narrative   ** Merged History Encounter **        Hospital Course:  Patient was admitted after a day or so in the emergency room. She had a brief further period of alcohol withdrawal but completed it without any sign of delirium or seizures and is now off any medicine for withdrawal. Patient has been continued on antidepressant medicine and treated in groups and individual therapy. She denies any suicidal ideation and has expressed plans for the future. Arrangements were made for transfer to the alcohol and drug abuse treatment program in [place]. The patient however  refused to go because her daughter was not able to come and bring her clothes and toiletries. At this point she no longer requires inpatient level of treatment. She has outpatient treatment through her primary care doctor through referral to [facility] and through regular [place] meetings. Patient will be discharged and can follow-up locally in the community. She understands that she can continue to await a bed at the alcohol and drug abuse treatment center outside the hospital.  Physical Findings: AIMS:  , ,  ,  ,    CIWA:  CIWA-Ar Total: 0 COWS:     Musculoskeletal: Strength & Muscle Tone: within normal limits Gait & Station: normal Patient leans: N/A  Psychiatric Specialty Exam: Physical Exam  Nursing note and vitals reviewed. Constitutional: She appears well-developed and well-nourished.  HENT:  Head: Normocephalic and atraumatic.  Eyes: Conjunctivae are normal. Pupils are equal, round, and reactive to light.  Neck: Normal range of motion.  Cardiovascular: Regular rhythm and normal heart sounds.   Respiratory: Effort normal. No respiratory distress.  GI: Soft. She exhibits no distension.  Musculoskeletal: Normal range of motion.  Neurological: She is alert.  Skin: Skin is warm and dry.  Psychiatric: Her speech is normal. Her affect is angry and labile. She is agitated. Thought content is not paranoid and not delusional. Cognition and memory are normal. She expresses impulsivity. She expresses no homicidal and no suicidal ideation.    Review of Systems  Constitutional: Negative.   HENT: Negative.   Eyes: Negative.   Respiratory: Negative.   Cardiovascular: Negative.   Gastrointestinal: Negative.   Musculoskeletal: Positive for back pain, joint pain, myalgias and neck pain.  Skin: Negative.   Neurological: Negative.   Psychiatric/Behavioral: Negative for depression, hallucinations, substance abuse and suicidal ideas. The patient is not nervous/anxious and does not have insomnia.      Blood pressure (!) 101/45, pulse 92, temperature 98.2 F (36.8 C), temperature source Oral, resp. rate 20, height 5\' 5"  (1.651 m), weight 65.8 kg (145 lb), SpO2 100 %.Body mass index is 24.13 kg/m.  General Appearance: Fairly Groomed  Eye Contact:  Good  Speech:  Clear and Coherent  Volume:  Normal  Mood:  Irritable  Affect:  Congruent  Thought Process:  Goal Directed  Orientation:  Full (Time, Place, and Person)  Thought Content:  Logical  Suicidal Thoughts:  No  Homicidal Thoughts:  No  Memory:  Immediate;   Good Recent;   Fair Remote;   Fair  Judgement:  Fair  Insight:  Fair  Psychomotor Activity:  Normal  Concentration:  Concentration: Fair  Recall:  [employer] of Knowledge:  Fair  Language:  Fair  Akathisia:  No  Handed:  Right  AIMS (if indicated):     Assets:  Communication Skills Desire for Improvement Financial Resources/Insurance Housing Resilience Social Support  ADL's:  Intact  Cognition:  WNL  Sleep:  Number of Hours: 7.25     Have you used any form of tobacco in the last 30 days? (Cigarettes, Smokeless Tobacco, Cigars, and/or Pipes): Yes  Has this patient used any form of tobacco in the last 30 days? (Cigarettes, Smokeless Tobacco, Cigars, and/or Pipes) Yes, No  Blood Alcohol level:  Lab Results  Component Value Date   ETH 49 (H) 07/02/2016   ETH <5 [id number]    Metabolic Disorder Labs:  Lab Results  Component Value Date   HGBA1C 5.0 07/05/2016   MPG 97 07/05/2016   No results found for: PROLACTIN Lab Results  Component Value Date   CHOL 215 (H) 07/05/2016   TRIG 131 07/05/2016   HDL 53 07/05/2016   CHOLHDL 4.1 07/05/2016   VLDL 26 07/05/2016   LDLCALC 136 (H) 07/05/2016   LDLCALC 116 (H) 07/05/2015    See Psychiatric Specialty Exam and Suicide Risk Assessment completed by Attending Physician prior to discharge.  Discharge destination:  Home  Is patient on multiple antipsychotic therapies at discharge:  No   Has Patient had  three or more failed trials of antipsychotic monotherapy by history:  No  Recommended Plan for Multiple Antipsychotic Therapies: NA   Allergies as of 07/11/2016      Reactions   Benadryl [diphenhydramine] Hives, Other (See Comments)   Reaction:  Hyperactivity    Benadryl [diphenhydramine] Other (See Comments)   My jaw locked and I could not speak, like a stroke.   Cephalosporins Hives   Codeine Nausea And Vomiting   Codeine Nausea Only   severe         Follow-up Information    Tommy Medal, MD Follow up.   Specialty:  Family Medicine Contact information: [id number] [place] [address] [phone]           Follow-up recommendations:  Activity:  Activity as tolerated Diet:  Regular diet Other:  Patient is referred to primary care doctor, [facility], alcoholics anonymous. She is to continue her current medication and do everything she can to avoid resumption of drinking. She also understands that she can continue to try to go to the alcohol and drug abuse treatment center as an outpatient  Comments:  Patient understands treatment plan. Reviewed with nursing and social work. Discharge orders completed with prescriptions done.  Signed: Alethia Berthold, MD 07/11/2016, 12:05 PM

## 2016-07-11 NOTE — BHH Suicide Risk Assessment (Signed)
[facility] Discharge Suicide Risk Assessment   Principal Problem: Severe recurrent major depression without psychotic features [facility]) Discharge Diagnoses:  Patient Active Problem List   Diagnosis Date Noted  . Overdose of antidepressant [T43.201A] 07/03/2016  . 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 Severe recurrent major depression without psychotic features ([place]) [F33.2] 07/03/2015  . Alcohol use disorder, severe, dependence ([place]) [F10.20] 01/26/2015  . Tobacco use disorder [F17.200] 01/26/2015  . Alcohol withdrawal ([place]) [F10.239] 01/08/2015  . 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 GAD (generalized anxiety disorder) [F41.1] 12/24/2013  . PTSD (post-traumatic stress disorder) [F43.10] 12/22/2013    Total Time spent with patient: 45 minutes  Musculoskeletal: Strength & Muscle Tone: within normal limits Gait & Station: normal Patient leans: N/A  Psychiatric Specialty Exam: Review of Systems  Constitutional: Negative.   HENT: Negative.   Eyes: Negative.   Respiratory: Negative.   Cardiovascular: Negative.   Gastrointestinal: Negative.   Musculoskeletal: Positive for back pain, joint pain, myalgias and neck pain.  Skin: Negative.   Neurological: Negative.   Psychiatric/Behavioral: Negative for depression, hallucinations, memory loss, substance abuse and suicidal ideas. The patient is not nervous/anxious and does not have insomnia.     Blood pressure (!) 101/45, pulse 92, temperature 98.2 F (36.8 C), temperature source Oral, resp. rate 20, height 5\' 5"  (1.651 m), weight 65.8 kg (145 lb), SpO2 100 %.Body mass index is 24.13 kg/m.  General Appearance: Fairly Groomed  Engineer, water::  Good  Speech:  Clear and [id number]  Volume:  Normal  Mood:  Irritable  Affect:  Congruent  Thought Process:  Goal Directed  Orientation:  Full (Time, Place, and Person)  Thought Content:  Logical  Suicidal Thoughts:  No  Homicidal Thoughts:  No   Memory:  Immediate;   Good Recent;   Fair Remote;   Fair  Judgement:  Fair  Insight:  Fair  Psychomotor Activity:  Normal  Concentration:  Fair  Recall:  [employer] of [place]  Language: Fair  Akathisia:  No  Handed:  Right  AIMS (if indicated):     Assets:  Communication Skills Desire for Improvement Housing Physical Health Resilience Social Support  Sleep:  Number of Hours: 7.25  Cognition: WNL  ADL's:  Intact   Mental Status Per Nursing Assessment::   On Admission:     Demographic Factors:  Caucasian and Living alone  Loss Factors: Financial problems/change in socioeconomic status  Historical Factors: Prior suicide attempts and Impulsivity  Risk Reduction Factors:   Religious beliefs about death and Positive therapeutic relationship  Continued Clinical Symptoms:  Depression:   Comorbid alcohol abuse/dependence Alcohol/Substance Abuse/Dependencies  Cognitive Features That Contribute To Risk:  Closed-mindedness    Suicide Risk:  Mild:  Suicidal ideation of limited frequency, intensity, duration, and specificity.  There are no identifiable plans, no associated intent, mild dysphoria and related symptoms, good self-control (both objective and subjective assessment), few other risk factors, and identifiable protective factors, including available and accessible social support.  Follow-up Information    Tommy Medal, MD Follow up.   Specialty:  Family Medicine Contact information: [id number] [place] [address] [phone]           Plan Of Care/Follow-up recommendations:  Activity:  Activity as tolerated Diet:  Regular diet Other:  Patient's suicide risk is primarily dependent on her alcohol abuse. Patient needs to get back into regular treatment through AA and local mental health and follow-up also with her  primary care doctor while staying on her medicine and do everything she can to avoid getting back into drinking.  Alethia Berthold,  MD 07/11/2016, 12:01 PM

## 2016-07-11 NOTE — Progress Notes (Signed)
  [facility] Adult Case Management Discharge Plan :  Will you be returning to the same living situation after discharge:  Yes,  home At discharge, do you have transportation home?: Yes,  taxi voucher Do you have the ability to pay for your medications: Yes,  Patient has insurance.  Release of information consent forms completed and in the chart;  Patient's signature needed at discharge.  Patient to Follow up at: Follow-up Information    Tommy Medal, MD Follow up.   Specialty:  Family Medicine Contact information: [id number] [address] [place] Follow up on 07/14/2016.   Why:  Follow-up appointment on this date at 2:30pm for outpatient services. Bring photo I.D., discharge summary, and current medications.  Contact information: [place] [address] [phone]           Next level of care provider has access to [place] and Suicide Prevention discussed: Yes,  with patient  Have you used any form of tobacco in the last 30 days? (Cigarettes, Smokeless Tobacco, Cigars, and/or Pipes): Yes  Has patient been referred to the Quitline?: Patient refused referral  Patient has been referred for addiction treatment: Yes  Moise Friday G. Claybon Jabs MSW, [place] 07/11/2016, 2:05 PM

## 2016-10-09 DIAGNOSIS — G5692 Unspecified mononeuropathy of left upper limb: Secondary | ICD-10-CM | POA: Insufficient documentation

## 2016-10-09 DIAGNOSIS — N3946 Mixed incontinence: Secondary | ICD-10-CM | POA: Insufficient documentation

## 2016-10-09 DIAGNOSIS — F1011 Alcohol abuse, in remission: Secondary | ICD-10-CM | POA: Insufficient documentation

## 2017-05-08 ENCOUNTER — Encounter: Payer: Self-pay | Admitting: Emergency Medicine

## 2017-05-08 ENCOUNTER — Other Ambulatory Visit: Payer: Self-pay

## 2017-05-08 ENCOUNTER — Inpatient Hospital Stay
Admission: EM | Admit: 2017-05-08 | Discharge: 2017-05-11 | DRG: 897 | Disposition: A | Discharge status: Rehabilitation Facility | Payer: Medicare Other | Attending: Internal Medicine | Admitting: Internal Medicine

## 2017-05-08 DIAGNOSIS — M1990 Unspecified osteoarthritis, unspecified site: Secondary | ICD-10-CM | POA: Diagnosis present

## 2017-05-08 DIAGNOSIS — Z915 Personal history of self-harm: Secondary | ICD-10-CM

## 2017-05-08 DIAGNOSIS — R112 Nausea with vomiting, unspecified: Secondary | ICD-10-CM

## 2017-05-08 DIAGNOSIS — Z96642 Presence of left artificial hip joint: Secondary | ICD-10-CM | POA: Diagnosis present

## 2017-05-08 DIAGNOSIS — F17210 Nicotine dependence, cigarettes, uncomplicated: Secondary | ICD-10-CM | POA: Diagnosis present

## 2017-05-08 DIAGNOSIS — G4700 Insomnia, unspecified: Secondary | ICD-10-CM | POA: Diagnosis present

## 2017-05-08 DIAGNOSIS — E871 Hypo-osmolality and hyponatremia: Secondary | ICD-10-CM | POA: Diagnosis present

## 2017-05-08 DIAGNOSIS — Z888 Allergy status to other drugs, medicaments and biological substances status: Secondary | ICD-10-CM

## 2017-05-08 DIAGNOSIS — J449 Chronic obstructive pulmonary disease, unspecified: Secondary | ICD-10-CM | POA: Diagnosis present

## 2017-05-08 DIAGNOSIS — R197 Diarrhea, unspecified: Secondary | ICD-10-CM | POA: Diagnosis present

## 2017-05-08 DIAGNOSIS — F4310 Post-traumatic stress disorder, unspecified: Secondary | ICD-10-CM | POA: Diagnosis present

## 2017-05-08 DIAGNOSIS — R070 Pain in throat: Secondary | ICD-10-CM | POA: Diagnosis present

## 2017-05-08 DIAGNOSIS — E872 Acidosis: Secondary | ICD-10-CM | POA: Diagnosis present

## 2017-05-08 DIAGNOSIS — Z89222 Acquired absence of left upper limb above elbow: Secondary | ICD-10-CM | POA: Diagnosis not present

## 2017-05-08 DIAGNOSIS — F1020 Alcohol dependence, uncomplicated: Secondary | ICD-10-CM | POA: Diagnosis present

## 2017-05-08 DIAGNOSIS — E876 Hypokalemia: Secondary | ICD-10-CM | POA: Diagnosis present

## 2017-05-08 DIAGNOSIS — E8729 Other acidosis: Secondary | ICD-10-CM | POA: Diagnosis present

## 2017-05-08 DIAGNOSIS — F3164 Bipolar disorder, current episode mixed, severe, with psychotic features: Secondary | ICD-10-CM

## 2017-05-08 DIAGNOSIS — F419 Anxiety disorder, unspecified: Secondary | ICD-10-CM | POA: Diagnosis present

## 2017-05-08 DIAGNOSIS — D72829 Elevated white blood cell count, unspecified: Secondary | ICD-10-CM | POA: Diagnosis present

## 2017-05-08 DIAGNOSIS — G629 Polyneuropathy, unspecified: Secondary | ICD-10-CM | POA: Diagnosis present

## 2017-05-08 DIAGNOSIS — E860 Dehydration: Secondary | ICD-10-CM | POA: Diagnosis present

## 2017-05-08 DIAGNOSIS — F10239 Alcohol dependence with withdrawal, unspecified: Secondary | ICD-10-CM | POA: Diagnosis present

## 2017-05-08 DIAGNOSIS — R221 Localized swelling, mass and lump, neck: Secondary | ICD-10-CM

## 2017-05-08 DIAGNOSIS — R45851 Suicidal ideations: Secondary | ICD-10-CM | POA: Diagnosis present

## 2017-05-08 DIAGNOSIS — Z885 Allergy status to narcotic agent status: Secondary | ICD-10-CM | POA: Diagnosis not present

## 2017-05-08 DIAGNOSIS — F1010 Alcohol abuse, uncomplicated: Secondary | ICD-10-CM

## 2017-05-08 DIAGNOSIS — F332 Major depressive disorder, recurrent severe without psychotic features: Secondary | ICD-10-CM | POA: Diagnosis present

## 2017-05-08 LAB — CBC WITH DIFFERENTIAL/PLATELET
BASOS ABS: 0 10*3/uL (ref 0–0.1)
BASOS PCT: 0 %
EOS ABS: 0 10*3/uL (ref 0–0.7)
Eosinophils Relative: 0 %
HEMATOCRIT: 47.8 % — AB (ref 35.0–47.0)
HEMOGLOBIN: 16.2 g/dL — AB (ref 12.0–16.0)
Lymphocytes Relative: 18 %
Lymphs Abs: 2.7 10*3/uL (ref 1.0–3.6)
MCH: 31.4 pg (ref 26.0–34.0)
MCHC: 33.9 g/dL (ref 32.0–36.0)
MCV: 92.8 fL (ref 80.0–100.0)
MONOS PCT: 8 %
Monocytes Absolute: 1.2 10*3/uL — ABNORMAL HIGH (ref 0.2–0.9)
NEUTROS ABS: 10.9 10*3/uL — AB (ref 1.4–6.5)
NEUTROS PCT: 74 %
Platelets: 249 10*3/uL (ref 150–440)
RBC: 5.15 MIL/uL (ref 3.80–5.20)
RDW: 14.8 % — ABNORMAL HIGH (ref 11.5–14.5)
WBC: 14.9 10*3/uL — AB (ref 3.6–11.0)

## 2017-05-08 LAB — COMPREHENSIVE METABOLIC PANEL
ALBUMIN: 4.1 g/dL (ref 3.5–5.0)
ALK PHOS: 69 U/L (ref 38–126)
ALT: 21 U/L (ref 14–54)
AST: 31 U/L (ref 15–41)
Anion gap: 19 — ABNORMAL HIGH (ref 5–15)
BILIRUBIN TOTAL: 1.2 mg/dL (ref 0.3–1.2)
BUN: 7 mg/dL (ref 6–20)
CALCIUM: 8.5 mg/dL — AB (ref 8.9–10.3)
CO2: 18 mmol/L — AB (ref 22–32)
CREATININE: 0.59 mg/dL (ref 0.44–1.00)
Chloride: 85 mmol/L — ABNORMAL LOW (ref 101–111)
GFR calc Af Amer: >60 mL/min (ref >60)
GFR calc non Af Amer: >60 mL/min (ref >60)
GLUCOSE: 74 mg/dL (ref 65–99)
Potassium: 3.5 mmol/L (ref 3.5–5.1)
SODIUM: 122 mmol/L — AB (ref 135–145)
TOTAL PROTEIN: 7.3 g/dL (ref 6.5–8.1)

## 2017-05-08 LAB — LIPASE, BLOOD: Lipase: 24 U/L (ref 11–51)

## 2017-05-08 LAB — ETHANOL: ALCOHOL ETHYL (B): 189 mg/dL — AB (ref <10)

## 2017-05-08 MED ORDER — GABAPENTIN 300 MG PO CAPS
900.0000 mg | ORAL_CAPSULE | Freq: Three times a day (TID) | ORAL | Status: DC
  Administered 2017-05-08 – 2017-05-11 (×8): 900 mg via ORAL
  Filled 2017-05-08 (×10): qty 3

## 2017-05-08 MED ORDER — ACETAMINOPHEN 650 MG RE SUPP: 650.0000 mg | Freq: Four times a day (QID) | RECTAL | Status: DC | PRN

## 2017-05-08 MED ORDER — ADULT MULTIVITAMIN W/MINERALS CH
1.0000 | ORAL_TABLET | Freq: Every day | ORAL | Status: DC
Start: 2017-05-09 — End: 2017-05-11
  Administered 2017-05-09 – 2017-05-11 (×3): 1 via ORAL
  Filled 2017-05-08 (×3): qty 1

## 2017-05-08 MED ORDER — SODIUM CHLORIDE 0.9 % IV BOLUS (SEPSIS)
1000.0000 mL | Freq: Once | INTRAVENOUS | Status: AC
  Administered 2017-05-08: 1000 mL via INTRAVENOUS

## 2017-05-08 MED ORDER — ENOXAPARIN SODIUM 40 MG/0.4ML ~~LOC~~ SOLN
40.0000 mg | SUBCUTANEOUS | Status: DC
  Administered 2017-05-09 – 2017-05-10 (×2): 40 mg via SUBCUTANEOUS
  Filled 2017-05-08 (×2): qty 0.4

## 2017-05-08 MED ORDER — PROPRANOLOL HCL 10 MG PO TABS
10.0000 mg | ORAL_TABLET | Freq: Three times a day (TID) | ORAL | Status: DC
  Administered 2017-05-08 – 2017-05-11 (×7): 10 mg via ORAL
  Filled 2017-05-08 (×10): qty 1

## 2017-05-08 MED ORDER — LORAZEPAM 2 MG/ML IJ SOLN
2.0000 mg | INTRAMUSCULAR | Status: DC | PRN
  Administered 2017-05-08 – 2017-05-09 (×2): 2 mg via INTRAVENOUS
  Filled 2017-05-08 (×2): qty 1

## 2017-05-08 MED ORDER — VITAMIN B-1 100 MG PO TABS
100.0000 mg | ORAL_TABLET | Freq: Every day | ORAL | Status: DC
  Administered 2017-05-09 – 2017-05-11 (×3): 100 mg via ORAL
  Filled 2017-05-08 (×3): qty 1

## 2017-05-08 MED ORDER — MAGNESIUM SULFATE 2 GM/50ML IV SOLN
2.0000 g | Freq: Once | INTRAVENOUS | Status: DC
  Filled 2017-05-08: qty 50

## 2017-05-08 MED ORDER — LORAZEPAM 2 MG/ML IJ SOLN
2.0000 mg | Freq: Once | INTRAMUSCULAR | Status: AC
Start: 2017-05-08 — End: 2017-05-08
  Administered 2017-05-08: 2 mg via INTRAVENOUS
  Filled 2017-05-08: qty 1

## 2017-05-08 MED ORDER — THIAMINE HCL 100 MG/ML IJ SOLN
Freq: Once | INTRAVENOUS | Status: AC
  Administered 2017-05-08: 16:00:00 via INTRAVENOUS
  Filled 2017-05-08: qty 1000

## 2017-05-08 MED ORDER — LORAZEPAM 2 MG PO TABS
2.0000 mg | ORAL_TABLET | ORAL | Status: DC | PRN
  Administered 2017-05-10 – 2017-05-11 (×4): 2 mg via ORAL
  Filled 2017-05-08 (×4): qty 1

## 2017-05-08 MED ORDER — THIAMINE HCL 100 MG/ML IJ SOLN: 100.0000 mg | Freq: Every day | INTRAMUSCULAR | Status: DC

## 2017-05-08 MED ORDER — VENLAFAXINE HCL ER 75 MG PO CP24
150.0000 mg | ORAL_CAPSULE | Freq: Every day | ORAL | Status: DC
  Administered 2017-05-09 – 2017-05-11 (×3): 150 mg via ORAL
  Filled 2017-05-08 (×3): qty 2

## 2017-05-08 MED ORDER — FOLIC ACID 1 MG PO TABS
1.0000 mg | ORAL_TABLET | Freq: Every day | ORAL | Status: DC
  Administered 2017-05-09 – 2017-05-11 (×3): 1 mg via ORAL
  Filled 2017-05-08 (×3): qty 1

## 2017-05-08 MED ORDER — TRAZODONE HCL 100 MG PO TABS
300.0000 mg | ORAL_TABLET | Freq: Every day | ORAL | Status: DC
  Administered 2017-05-08 – 2017-05-10 (×3): 300 mg via ORAL
  Filled 2017-05-08 (×3): qty 3

## 2017-05-08 MED ORDER — ACETAMINOPHEN 325 MG PO TABS
650.0000 mg | ORAL_TABLET | Freq: Four times a day (QID) | ORAL | Status: DC | PRN
  Administered 2017-05-09 – 2017-05-10 (×2): 650 mg via ORAL
  Filled 2017-05-08 (×2): qty 2

## 2017-05-08 MED ORDER — POTASSIUM CHLORIDE IN NACL 20-0.9 MEQ/L-% IV SOLN
INTRAVENOUS | Status: DC
Start: 2017-05-08 — End: 2017-05-10
  Administered 2017-05-08 – 2017-05-10 (×3): via INTRAVENOUS
  Filled 2017-05-08 (×4): qty 1000

## 2017-05-08 MED ORDER — ONDANSETRON HCL 4 MG/2ML IJ SOLN
4.0000 mg | Freq: Four times a day (QID) | INTRAMUSCULAR | Status: DC | PRN
  Administered 2017-05-09 – 2017-05-11 (×5): 4 mg via INTRAVENOUS
  Filled 2017-05-08 (×5): qty 2

## 2017-05-08 NOTE — ED Provider Notes (Signed)
[facility] Emergency Department Provider Note  ____________________________________________  Time seen: Approximately 3:40 PM  I have reviewed the triage vital signs and the nursing notes.   HISTORY  Chief Complaint Nausea; Emesis; and Diarrhea   HPI [name] is a 61 y.o. female a history of alcohol abuse who presents for evaluation of nausea, vomiting, diarrhea. Patient reports that she went on a binge for 5 days drinking 3 x 40 ounce beers a day. 3 days ago she started having vomiting and diarrhea. She reports several daily episodes of nonbloody nonbilious emesis and watery diarrhea. She has had nausea and chills. She feels very dehydrated. She denies any history of complicated withdrawal. She denies abdominal pain, chest pain, melena, hematemesis, coffee ground emesis. She denies history of cirrhosis. She denies any other drug use.  Past Medical History:  Diagnosis Date  . Anxiety   . Anxiety    panic anxiety disorder  . Arthritis   . Asthma    chronic asthmatic bronchitis  . Cancer [facility])    Desmoid tumor left forearm  . Depression   . ETOH abuse   . Heart murmur   . Insomnia   . Neuropathy     Patient Active Problem List   Diagnosis Date Noted  . Overdose of antidepressant 07/03/2016  . S/P total hip arthroplasty 09/17/2015  . Severe recurrent major depression without psychotic features ([employer]) 07/03/2015  . Alcohol use disorder, severe, dependence ([place]) 01/26/2015  . Tobacco use disorder 01/26/2015  . Alcohol withdrawal ([place]) 01/08/2015  . 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 GAD (generalized anxiety disorder) 12/24/2013  . PTSD (post-traumatic stress disorder) 12/22/2013    Past Surgical History:  Procedure Laterality Date  . ABDOMINAL HYSTERECTOMY    . ABDOMINAL HYSTERECTOMY  2007  . 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 ARM AMPUTATION AT SHOULDER Left   . CESAREAN SECTION     . [place]   x 2  . JOINT REPLACEMENT Right 2012   hip  . LUMBAR LAMINECTOMY/DECOMPRESSION MICRODISCECTOMY 1 LEVEL Left 04/16/2016   Performed by Meade Maw, MD at [facility] ORS  . TOTAL HIP ARTHROPLASTY Left 09/17/2015   Performed by Dereck Leep, MD at [facility] ORS    Prior to Admission medications   Medication Sig Start Date End Date Taking? Authorizing Provider  cyclobenzaprine (FLEXERIL) 10 MG tablet Take 1 tablet (10 mg total) by mouth every 6 (six) hours as needed for muscle spasms. 07/11/16   Clapacs, [name], MD  gabapentin (NEURONTIN) 300 MG capsule Take 3 capsules (900 mg total) by mouth 3 (three) times daily. 07/11/16   Clapacs, [name], MD  propranolol (INDERAL) 10 MG tablet Take 1 tablet (10 mg total) by mouth 3 (three) times daily. 07/11/16   Clapacs, [name], MD  traZODone (DESYREL) 100 MG tablet Take 2 tablets (200 mg total) by mouth at bedtime. 07/11/16   Clapacs, [name], MD  venlafaxine XR (EFFEXOR-XR) 150 MG 24 hr capsule Take 1 capsule (150 mg total) by mouth daily with breakfast. 07/12/16   Clapacs, [name], MD    Allergies Benadryl [diphenhydramine]; Benadryl [diphenhydramine]; Cephalosporins; Codeine; and Codeine  Family History  Problem Relation Age of Onset  . COPD Mother   . Alcoholism Father   . COPD Father     Social History Social History   Tobacco Use  . Smoking status: Current Every Day  Smoker    Packs/day: 0.25    Types: Cigarettes  . Smokeless tobacco: Never Used  Substance Use Topics  . Alcohol use: Yes    Comment: 2 40oz beers daily  . Drug use: No    Comment: Patient denies     Review of Systems  Constitutional: Negative for fever. Eyes: Negative for visual changes. ENT: Negative for sore throat. Neck: No neck pain  Cardiovascular: Negative for chest pain. Respiratory: Negative for shortness of breath. Gastrointestinal: Negative for abdominal pain. + vomiting and diarrhea. Genitourinary: Negative for  dysuria. Musculoskeletal: Negative for back pain. Skin: Negative for rash. Neurological: Negative for headaches, weakness or numbness. Psych: No SI or HI  ____________________________________________   PHYSICAL EXAM:  VITAL SIGNS: ED Triage Vitals  Enc Vitals Group     BP 05/08/17 1419 (!) 163/67     Pulse Rate 05/08/17 1419 84     Resp 05/08/17 1419 20     Temp 05/08/17 1419 98.5 F (36.9 C)     Temp Source 05/08/17 1419 Oral     SpO2 05/08/17 1419 97 %     Weight 05/08/17 1419 142 lb (64.4 kg)     Height 05/08/17 1419 5\' 5"  (1.651 m)     Head Circumference --      Peak Flow --      Pain Score 05/08/17 1418 0     Pain Loc --      Pain Edu? --      Excl. in [place]? --     Constitutional: Alert and oriented. Well appearing and in no apparent distress. HEENT:      Head: Normocephalic and atraumatic.         Eyes: Conjunctivae are normal. Sclera is non-icteric.       Mouth/Throat: Mucous membranes are moist.       Neck: Supple with no signs of meningismus. Cardiovascular: Regular rate and rhythm. No murmurs, gallops, or rubs. 2+ symmetrical distal pulses are present in all extremities. No JVD. Respiratory: Normal respiratory effort. Lungs are clear to auscultation bilaterally. No wheezes, crackles, or rhonchi.  Gastrointestinal: Soft, non tender, and non distended with positive bowel sounds. No rebound or guarding. Musculoskeletal: Nontender with normal range of motion in all extremities. No edema, cyanosis, or erythema of extremities. Neurologic: Normal speech and language. Face is symmetric. Moving all extremities. No gross focal neurologic deficits are appreciated. Skin: Skin is warm, dry and intact. No rash noted. Psychiatric: Mood and affect are normal. Speech and behavior are normal.  ____________________________________________   LABS (all labs ordered are listed, but only abnormal results are displayed)  Labs Reviewed  CBC WITH DIFFERENTIAL/PLATELET - Abnormal;  Notable for the following components:      Result Value   WBC 14.9 (*)    Hemoglobin 16.2 (*)    HCT 47.8 (*)    RDW 14.8 (*)    Neutro Abs 10.9 (*)    Monocytes Absolute 1.2 (*)    All other components within normal limits  COMPREHENSIVE METABOLIC PANEL - Abnormal; Notable for the following components:   Sodium 122 (*)    Chloride 85 (*)    CO2 18 (*)    Calcium 8.5 (*)    Anion gap 19 (*)    All other components within normal limits  ETHANOL - Abnormal; Notable for the following components:   Alcohol, Ethyl (B) 189 (*)    All other components within normal limits  C DIFFICILE QUICK SCREEN W PCR REFLEX  LIPASE, BLOOD  ETHYLENE GLYCOL  VOLATILES,BLD-ACETONE,ETHANOL,ISOPROP,METHANOL   ____________________________________________  EKG  none  ____________________________________________  RADIOLOGY  none  ____________________________________________   PROCEDURES  Procedure(s) performed: None Procedures Critical Care performed:  None ____________________________________________   INITIAL IMPRESSION / ASSESSMENT AND PLAN / ED COURSE  61 y.o. female a history of alcohol abuse who presents for evaluation of nausea, vomiting, diarrhea after going on a binge for 5 days. Patient looks well with no signs of withdrawal, her vital signs are within normal limits, her abdomen is soft and no tenderness throughout. Labs concerning for alcoholic ketoacidosis and hyponatremia. Patient will be given fluids and zofram and is going to be admitted to the hospitalist service     As part of my medical decision making, I reviewed the following data within the [place] notes reviewed and incorporated, Labs reviewed , Discussed with admitting physician , Notes from prior ED visits and [place] Controlled Substance Database    Pertinent labs & imaging results that were available during my care of the patient were reviewed by me and considered in my medical decision  making (see chart for details).    ____________________________________________   FINAL CLINICAL IMPRESSION(S) / ED DIAGNOSES  Final diagnoses:  Alcoholic ketoacidosis  Hyponatremia  Alcohol abuse  Nausea vomiting and diarrhea      NEW MEDICATIONS STARTED DURING THIS VISIT:  This SmartLink is deprecated. Use AVSMEDLIST instead to display the medication list for a patient.   Note:  This document was prepared using Dragon voice recognition software and may include unintentional dictation errors.    Rudene Re, MD 05/08/17 [phone]

## 2017-05-08 NOTE — H&P (Signed)
[place] at [place] NAME: [name]    MR#:  [id number]  DATE OF BIRTH:  09/18/1955  DATE OF ADMISSION:  05/08/2017  PRIMARY CARE PHYSICIAN: Dr. Vilinda Flake  REQUESTING/REFERRING PHYSICIAN: Dr. Gonzella Lex  CHIEF COMPLAINT:   Chief Complaint  Patient presents with  . Nausea  . Emesis  . Diarrhea    HISTORY OF PRESENT ILLNESS:  [name]  is a 61 y.o. female with a known history of alcohol abuse.  She has been drinking a lot lately.  She states she is going through withdrawal.  She states her last drink was this morning at 10 AM now.  She normally drinks 340 ounce beers a day.  She has been having nausea vomiting diarrhea.  She has been sweating.  She has been dehydrated.  Not feeling well and cannot stay still.  In the ER she was found to have a high anion gap acidosis and a low sodium.  Hospitalist services contacted for further evaluation.  Patient denies drinking any methanol or antifreeze.  PAST MEDICAL HISTORY:   Past Medical History:  Diagnosis Date  . Anxiety   . Anxiety    panic anxiety disorder  . Arthritis   . Asthma    chronic asthmatic bronchitis  . Cancer [name])    Desmoid tumor left forearm  . Depression   . ETOH abuse   . Heart murmur   . Insomnia   . Neuropathy     PAST SURGICAL HISTORY:   Past Surgical History:  Procedure Laterality Date  . ABDOMINAL HYSTERECTOMY    . ABDOMINAL HYSTERECTOMY  2007  . 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 ARM AMPUTATION AT SHOULDER Left   . CESAREAN SECTION    . [place]   x 2  . JOINT REPLACEMENT Right 2012   hip  . LUMBAR LAMINECTOMY/DECOMPRESSION MICRODISCECTOMY 1 LEVEL Left 04/16/2016   Performed by Meade Maw, MD at [facility]  . TOTAL HIP ARTHROPLASTY Left 09/17/2015   Performed by Dereck Leep, MD at [facility]    SOCIAL HISTORY:   Social History   Tobacco Use  . Smoking status:  Current Every Day Smoker    Packs/day: 0.25    Types: Cigarettes  . Smokeless tobacco: Never Used  Substance Use Topics  . Alcohol use: Yes    Comment: 2 40oz beers daily    FAMILY HISTORY:   Family History  Problem Relation Age of Onset  . COPD Mother     DRUG ALLERGIES:   Allergies  Allergen Reactions  . Benadryl [Diphenhydramine] Hives and Other (See Comments)    Reaction:  Hyperactivity   . Benadryl [Diphenhydramine] Other (See Comments)    My jaw locked and I could not speak, like a stroke.  . Cephalosporins Hives  . Codeine Nausea And Vomiting  . Codeine Nausea Only    severe    REVIEW OF SYSTEMS:  CONSTITUTIONAL: No fever.  Positive for chills and sweats.  No fatigue or weakness.  EYES: No blurred or double vision.  Patient is nearsighted. EARS, NOSE, AND THROAT: No tinnitus or ear pain. No sore throat RESPIRATORY: No cough, shortness of breath, wheezing or hemoptysis.  CARDIOVASCULAR: No chest pain, orthopnea, edema.  GASTROINTESTINAL: Positive for nausea, vomiting, and diarrhea.  No abdominal pain. No blood in bowel movements GENITOURINARY: No dysuria, hematuria.  ENDOCRINE: No polyuria, nocturia,  HEMATOLOGY: No anemia,  easy bruising or bleeding SKIN: No rash or lesion. MUSCULOSKELETAL: No joint pain or arthritis.   NEUROLOGIC: No tingling, numbness, weakness.  PSYCHIATRY: History of anxiety and depression.  No thoughts of hurting herself or other people.  MEDICATIONS AT HOME:   Prior to Admission medications   Medication Sig Start Date End Date Taking? Authorizing Provider  gabapentin (NEURONTIN) 300 MG capsule Take 3 capsules (900 mg total) by mouth 3 (three) times daily. 07/11/16  Yes Clapacs, [name], MD  propranolol (INDERAL) 10 MG tablet Take 1 tablet (10 mg total) by mouth 3 (three) times daily. 07/11/16  Yes Clapacs, [name], MD  traZODone (DESYREL) 100 MG tablet Take 2 tablets (200 mg total) by mouth at bedtime. Patient taking differently: Take 300  mg at bedtime by mouth.  07/11/16  Yes Clapacs, [name], MD  venlafaxine XR (EFFEXOR-XR) 150 MG 24 hr capsule Take 1 capsule (150 mg total) by mouth daily with breakfast. 07/12/16  Yes Clapacs, [name], MD      VITAL SIGNS:  Blood pressure (!) 130/56, pulse 88, temperature 98.5 F (36.9 C), temperature source Oral, resp. rate 20, height 5\' 5"  (1.651 m), weight 64.4 kg (142 lb), SpO2 97 %.  PHYSICAL EXAMINATION:  GENERAL:  61 y.o.-year-old patient lying in the bed with no acute distress.  EYES: Pupils equal, round, reactive to light and accommodation. No scleral icterus. Extraocular muscles intact.  HEENT: Head atraumatic, normocephalic. Oropharynx and nasopharynx clear.  NECK:  Supple, no jugular venous distention. No thyroid enlargement, no tenderness.  LUNGS: Normal breath sounds bilaterally, no wheezing, rales,rhonchi or crepitation. No use of accessory muscles of respiration.  CARDIOVASCULAR: S1, S2 normal. No murmurs, rubs, or gallops.  ABDOMEN: Soft, nontender, nondistended. Bowel sounds present. No organomegaly or mass.  EXTREMITIES: No pedal edema, cyanosis, or clubbing.  NEUROLOGIC: Cranial nerves II through XII are intact. Muscle strength 5/5 in all extremities. Sensation intact. Gait not checked.  PSYCHIATRIC: The patient is alert and oriented x 3.  SKIN: No rash, lesion, or ulcer.   LABORATORY PANEL:   CBC Recent Labs  Lab 05/08/17 1452  WBC 14.9*  HGB 16.2*  HCT 47.8*  PLT 249   ------------------------------------------------------------------------------------------------------------------  Chemistries  Recent Labs  Lab 05/08/17 1452  NA 122*  K 3.5  CL 85*  CO2 18*  GLUCOSE 74  BUN 7  CREATININE 0.59  CALCIUM 8.5*  AST 31  ALT 21  ALKPHOS 69  BILITOT 1.2   ------------------------------------------------------------------------------------------------------------------    IMPRESSION AND PLAN:   1.  High anion gap acidosis.  Likely due to alcohol.   IV fluid hydration.  Send off viral tiles and ethylene glycol level. 2.  Alcohol withdrawal.  Start on CIWA protocol.  Ativan 2 mg IV x1 ordered.  ER physician ordered a banana bag.  I will continue oral thiamine multivitamin and folate tomorrow. 3.  Hyponatremia secondary to beer Potomania.  Should improve with IV fluid hydration. 4.  Hypokalemia.  Replace magnesium IV.  Replace potassium and IV fluids. 5.  Leukocytosis.  Likely secondary to nausea vomiting and diarrhea. 6.  Alcohol abuse patient was advised to stop drinking alcohol. 7.  Tobacco abuse.  Smoking cessation counseling done 4 minutes by me.  Refused nicotine patch.  All the records are reviewed and case discussed with ED provider. Management plans discussed with the patient, family and they are in agreement.  CODE STATUS: Full code  TOTAL TIME TAKING CARE OF THIS PATIENT: 50 minutes.    Saranya Harlin  Laverta Harnisch M.D on 05/08/2017 at 4:03 PM  Between 7am to 6pm - Pager - [phone]  After 6pm call admission pager [phone]  [facility]  [phone]  CC: Primary care physician; Dr Vilinda Flake

## 2017-05-08 NOTE — ED Triage Notes (Signed)
Pt to ER via [facility] to room 24. Pt is c/o N/V/D since yesterday. EMS did give her 8mg  of zofran. Pt reports that she does drink 2 40oz beers daily and has not been able to for the last two days because of the nausea. She reports that she is tired of vomiting.

## 2017-05-09 ENCOUNTER — Inpatient Hospital Stay: Payer: Medicare Other

## 2017-05-09 LAB — CBC
HEMATOCRIT: 41.8 % (ref 35.0–47.0)
Hemoglobin: 14.4 g/dL (ref 12.0–16.0)
MCH: 31.9 pg (ref 26.0–34.0)
MCHC: 34.4 g/dL (ref 32.0–36.0)
MCV: 92.6 fL (ref 80.0–100.0)
PLATELETS: 192 10*3/uL (ref 150–440)
RBC: 4.51 MIL/uL (ref 3.80–5.20)
RDW: 14.9 % — ABNORMAL HIGH (ref 11.5–14.5)
WBC: 8 10*3/uL (ref 3.6–11.0)

## 2017-05-09 LAB — BASIC METABOLIC PANEL
Anion gap: 10 (ref 5–15)
BUN: 14 mg/dL (ref 6–20)
CHLORIDE: 103 mmol/L (ref 101–111)
CO2: 21 mmol/L — ABNORMAL LOW (ref 22–32)
Calcium: 8.4 mg/dL — ABNORMAL LOW (ref 8.9–10.3)
Creatinine, Ser: 0.85 mg/dL (ref 0.44–1.00)
GFR calc Af Amer: >60 mL/min (ref >60)
GFR calc non Af Amer: >60 mL/min (ref >60)
Glucose, Bld: 97 mg/dL (ref 65–99)
POTASSIUM: 3.6 mmol/L (ref 3.5–5.1)
Sodium: 134 mmol/L — ABNORMAL LOW (ref 135–145)

## 2017-05-09 LAB — MAGNESIUM: Magnesium: 2 mg/dL (ref 1.7–2.4)

## 2017-05-09 MED ORDER — CEPASTAT 14.5 MG MT LOZG
1.0000 | LOZENGE | Freq: Four times a day (QID) | OROMUCOSAL | Status: DC | PRN
  Filled 2017-05-09: qty 9

## 2017-05-09 NOTE — Progress Notes (Signed)
[place] at [place] NAME: [name]    MR#:  [id number]  DATE OF BIRTH:  09-23-1955  SUBJECTIVE:  CHIEF COMPLAINT: Patient is resting comfortably.  Denies nausea vomiting or diarrhea.  Reports throat hurts, but able to tolerate breakfast today.  Last bowel movement yesterday  REVIEW OF SYSTEMS:  CONSTITUTIONAL: No fever, fatigue or weakness.  EYES: No blurred or double vision.  EARS, NOSE, AND THROAT: No tinnitus or ear pain.  Reporting throat pain RESPIRATORY: No cough, shortness of breath, wheezing or hemoptysis.  CARDIOVASCULAR: No chest pain, orthopnea, edema.  GASTROINTESTINAL: No nausea, vomiting, diarrhea or abdominal pain.  GENITOURINARY: No dysuria, hematuria.  ENDOCRINE: No polyuria, nocturia,  HEMATOLOGY: No anemia, easy bruising or bleeding SKIN: No rash or lesion. MUSCULOSKELETAL: No joint pain or arthritis.   NEUROLOGIC: No tingling, numbness, weakness.  PSYCHIATRY: No anxiety or depression.   DRUG ALLERGIES:   Allergies  Allergen Reactions  . Benadryl [Diphenhydramine] Hives and Other (See Comments)    Reaction:  Hyperactivity   . Benadryl [Diphenhydramine] Other (See Comments)    My jaw locked and I could not speak, like a stroke.  . Cephalosporins Hives  . Codeine Nausea And Vomiting  . Codeine Nausea Only    severe    VITALS:  Blood pressure (!) 124/50, pulse 98, temperature 98.5 F (36.9 C), temperature source Oral, resp. rate 16, height 5\' 5"  (1.651 m), weight 64.4 kg (142 lb), SpO2 94 %.  PHYSICAL EXAMINATION:  GENERAL:  61 y.o.-year-old patient lying in the bed with no acute distress.  EYES: Pupils equal, round, reactive to light and accommodation. No scleral icterus. Extraocular muscles intact.  HEENT: Head atraumatic, normocephalic. Oropharynx and nasopharynx clear.  NECK:  Supple, no jugular venous distention. No thyroid enlargement, no  Cervical lymphadenopathy LUNGS: Normal breath sounds  bilaterally, no wheezing, rales,rhonchi or crepitation. No use of accessory muscles of respiration.  CARDIOVASCULAR: S1, S2 normal. No murmurs, rubs, or gallops.  ABDOMEN: Soft, nontender, nondistended. Bowel sounds present. No organomegaly or mass.  EXTREMITIES: No pedal edema, cyanosis, or clubbing.  NEUROLOGIC: Cranial nerves II through XII are intact. Muscle strength 5/5 in all extremities. Sensation intact. Gait not checked.  PSYCHIATRIC: The patient is alert and oriented x 3.  SKIN: No obvious rash, lesion, or ulcer.    LABORATORY PANEL:   CBC Recent Labs  Lab 05/09/17 0301  WBC 8.0  HGB 14.4  HCT 41.8  PLT 192   ------------------------------------------------------------------------------------------------------------------  Chemistries  Recent Labs  Lab 05/08/17 1452 05/09/17 0301  NA 122* 134*  K 3.5 3.6  CL 85* 103  CO2 18* 21*  GLUCOSE 74 97  BUN 7 14  CREATININE 0.59 0.85  CALCIUM 8.5* 8.4*  MG  --  2.0  AST 31  --   ALT 21  --   ALKPHOS 69  --   BILITOT 1.2  --    ------------------------------------------------------------------------------------------------------------------  Cardiac Enzymes No results for input(s): TROPONINI in the last 168 hours. ------------------------------------------------------------------------------------------------------------------  RADIOLOGY:  No results found.  EKG:   Orders placed or performed during the hospital encounter of 04/03/16  . EKG 12-Lead  . EKG 12-Lead    ASSESSMENT AND PLAN:    1.  High anion gap acidosis.  Likely due to alcohol.   Clinically improving with IV fluids .tolerated breakfast today I Repeat alcohol level  2.  Alcohol withdrawal with history of alcohol abuse  on CIWA protocol.   continue oral thiamine  multivitamin and folate  ciwa score 0 Recommended outpatient follow-up with alcohol rehab center  3.  Hyponatremia secondary to beer Potomania.    Improved with IV fluids  4.   Hypokalemia.  Replace magnesium IV.  Replace potassium and IV fluids.  5.  Leukocytosis.  Likely secondary to nausea vomiting and diarrhea.  Resolved  6.    Throat pain ; check throat culture, ultrasound of the neck, Cepacol lozenges   7.  Tobacco abuse.  Smoking cessation counseling done 4 minutes by me.  Refused nicotine patch.   Disposition home; reports there is no transportation to go home.  Will consult social worker versus case management regarding home transportation   All the records are reviewed and case discussed with Care Management/Social Workerr. Management plans discussed with the patient, family and they are in agreement.  CODE STATUS: fc  TOTAL TIME TAKING CARE OF THIS PATIENT: 93minutes.   POSSIBLE D/C IN 1 DAYS, DEPENDING ON CLINICAL CONDITION.  Note: This dictation was prepared with Dragon dictation along with smaller phrase technology. Any transcriptional errors that result from this process are unintentional.   Nicholes Mango M.D on 05/09/2017 at 10:42 AM  Between 7am to 6pm - Pager - [phone] After 6pm go to [email or web address] - password [email or web address] [place] [facility]  Office  [phone]  CC: Primary care physician; Patient, No Pcp Per

## 2017-05-09 NOTE — Clinical Social Work Note (Signed)
Clinical Social Work Assessment  Patient Details  Name: [name] MRN: [id number] Date of Birth: 08/05/1955  Date of referral:  05/09/17               Reason for consult:  Substance Use/ETOH Abuse                Permission sought to share information with:    Permission granted to share information::     Name::        Agency::     Relationship::     Contact Information:     Housing/Transportation Living arrangements for the past 2 months:  Single Family Home Source of Information:  Patient Patient Interpreter Needed:  None Criminal Activity/Legal Involvement Pertinent to Current Situation/Hospitalization:  No - Comment as needed Significant Relationships:  Spouse Lives with:  Spouse Do you feel safe going back to the place where you live?  Yes Need for family participation in patient care:  Yes (Comment)  Care giving concerns:  Patient lives in [place] with her husband Laverna Peace.    Social Worker assessment / plan:  Holiday representative (CSW) reviewed chart and noted that patient came to [facility] for alcohol withdrawal. CSW met with patient alone at bedside to provide resources. Patient was alert and oriented X4 and was laying in the bed. CSW introduced self and explained role of CSW department. Patient reported that she lives in [place] with her husband Laverna Peace. Patient reported that she is in alcohol recovery and goes to [facility]. CSW provided patient with a list of outpatient substance abuse resources. Patient thanked CSW and stated that she is feeling much better today. Patient reported no other needs or concerns at this time. CSW will continue to follow and assist as needed.    Employment status:  Retired, Disabled (Comment on whether or not currently receiving Disability) Insurance information:  Medicare PT Recommendations:  Not assessed at this time Information / Referral to community resources:  Outpatient Substance Abuse Treatment Options  Patient/Family's Response to care:   Patient is already connected with [facility].   Patient/Family's Understanding of and Emotional Response to Diagnosis, Current Treatment, and Prognosis:  Patient was pleasant and thanked CSW for visit.   Emotional Assessment Appearance:  Appears stated age Attitude/Demeanor/Rapport:    Affect (typically observed):  Accepting, Adaptable, Pleasant Orientation:  Oriented to Self, Oriented to Place, Oriented to  Time, Oriented to Situation Alcohol / Substance use:  Alcohol Use Psych involvement (Current and /or in the community):  No (Comment)  Discharge Needs  Concerns to be addressed:  Discharge Planning Concerns Readmission within the last 30 days:  No Current discharge risk:  Substance Abuse Barriers to Discharge:  Continued Medical Work up   [employer], Veronia Beets, LCSW 05/09/2017, 3:13 PM

## 2017-05-10 LAB — BASIC METABOLIC PANEL
ANION GAP: 5 (ref 5–15)
BUN: 14 mg/dL (ref 6–20)
CALCIUM: 8.6 mg/dL — AB (ref 8.9–10.3)
CHLORIDE: 110 mmol/L (ref 101–111)
CO2: 25 mmol/L (ref 22–32)
Creatinine, Ser: 0.71 mg/dL (ref 0.44–1.00)
GFR calc non Af Amer: >60 mL/min (ref >60)
Glucose, Bld: 104 mg/dL — ABNORMAL HIGH (ref 65–99)
POTASSIUM: 4.1 mmol/L (ref 3.5–5.1)
Sodium: 140 mmol/L (ref 135–145)

## 2017-05-10 LAB — HIV ANTIBODY (ROUTINE TESTING W REFLEX): HIV Screen 4th Generation wRfx: NONREACTIVE

## 2017-05-10 LAB — ETHANOL: Alcohol, Ethyl (B): <10 mg/dL (ref <10)

## 2017-05-10 NOTE — Progress Notes (Signed)
[place] [facility] [place] NAME: [name]    MR#:  [id number]  DATE OF BIRTH:  [birth year]  SUBJECTIVE:  CHIEF COMPLAINT: Patient is resting comfortably.  Denies nausea vomiting or diarrhea.  Tolerating diet still complaining of throat pain.  Does not want to leave the hospital.  She is threatening that she is going to kill herself if I discharge her.  She wants to be transferred to inpatient psych for withdrawals but her CIWA score is 0  REVIEW OF SYSTEMS:  CONSTITUTIONAL: No fever, fatigue or weakness.  EYES: No blurred or double vision.  EARS, NOSE, AND THROAT: No tinnitus or ear pain.  Reporting throat pain RESPIRATORY: No cough, shortness of breath, wheezing or hemoptysis.  CARDIOVASCULAR: No chest pain, orthopnea, edema.  GASTROINTESTINAL: No nausea, vomiting, diarrhea or abdominal pain.  GENITOURINARY: No dysuria, hematuria.  ENDOCRINE: No polyuria, nocturia,  HEMATOLOGY: No anemia, easy bruising or bleeding SKIN: No rash or lesion. MUSCULOSKELETAL: No joint pain or arthritis.   NEUROLOGIC: No tingling, numbness, weakness.  PSYCHIATRY: Has h/o anxiety or depression.  States she wants to kill her if I discharge her  DRUG ALLERGIES:   Allergies  Allergen Reactions  . Benadryl [Diphenhydramine] Hives and Other (See Comments)    Reaction:  Hyperactivity   . Benadryl [Diphenhydramine] Other (See Comments)    My jaw locked and I could not speak, like a stroke.  . Cephalosporins Hives  . Codeine Nausea And Vomiting  . Codeine Nausea Only    severe    VITALS:  Blood pressure (!) 121/49, pulse 61, temperature 98.7 F (37.1 C), temperature source Oral, resp. rate 14, height 5\' 5"  (1.651 m), weight 64.4 kg (142 lb), SpO2 96 %.  PHYSICAL EXAMINATION:  GENERAL:  61 y.o.-year-old patient lying in the bed with no acute distress.  EYES: Pupils equal, round, reactive to light and accommodation. No scleral icterus. Extraocular muscles  intact.  HEENT: Head atraumatic, normocephalic. Oropharynx and nasopharynx clear.  NECK:  Supple, no jugular venous distention. No thyroid enlargement, no  Cervical lymphadenopathy LUNGS: Normal breath sounds bilaterally, no wheezing, rales,rhonchi or crepitation. No use of accessory muscles of respiration.  CARDIOVASCULAR: S1, S2 normal. No murmurs, rubs, or gallops.  ABDOMEN: Soft, nontender, nondistended. Bowel sounds present. No organomegaly or mass.  EXTREMITIES: No pedal edema, cyanosis, or clubbing.  NEUROLOGIC: Cranial nerves II through XII are intact. Muscle strength 5/5 in all extremities. Sensation intact. Gait not checked.  PSYCHIATRIC: The patient is alert and oriented x 3.  SKIN: No obvious rash, lesion, or ulcer.    LABORATORY PANEL:   CBC Recent Labs  Lab 05/09/17 0301  WBC 8.0  HGB 14.4  HCT 41.8  PLT 192   ------------------------------------------------------------------------------------------------------------------  Chemistries  Recent Labs  Lab 05/08/17 1452 05/09/17 0301 05/10/17 0325  NA 122* 134* 140  K 3.5 3.6 4.1  CL 85* 103 110  CO2 18* 21* 25  GLUCOSE 74 97 104*  BUN 7 14 14   CREATININE 0.59 0.85 0.71  CALCIUM 8.5* 8.4* 8.6*  MG  --  2.0  --   AST 31  --   --   ALT 21  --   --   ALKPHOS 69  --   --   BILITOT 1.2  --   --    ------------------------------------------------------------------------------------------------------------------  Cardiac Enzymes No results for input(s): TROPONINI in the last 168 hours. ------------------------------------------------------------------------------------------------------------------  RADIOLOGY:  US Soft Tissue Neck  Result Date: 05/09/2017 CLINICAL  DATA:  Other.  Neck swelling. EXAM: THYROID ULTRASOUND TECHNIQUE: Ultrasound examination of the thyroid gland and adjacent soft tissues was performed. COMPARISON:  None. FINDINGS: Parenchymal Echotexture: Mildly heterogenous Isthmus: 0.4 cm Right  lobe: 4.0 x 1.4 x 0.9 cm Left lobe: 2.7 x 1.0 x 0.8 cm _________________________________________________________ Estimated total number of nodules >/= 1 cm: 0 Number of spongiform nodules >/=  2 cm not described below (TR1): 0 Number of mixed cystic and solid nodules >/= 1.5 cm not described below (TR2): 0 _________________________________________________________ No discrete nodules are seen within the thyroid gland. No enlarged or abnormal appearing lymph nodes identified. IMPRESSION: Unremarkable thyroid ultrasound. The above is in keeping with the ACR TI-RADS recommendations - J Am Coll Radiol 2017;14:587-595. Electronically Signed   By: Aletta Edouard M.D.   On: 05/09/2017 12:42    EKG:   Orders placed or performed during the hospital encounter of 04/03/16  . EKG 12-Lead  . EKG 12-Lead    ASSESSMENT AND PLAN:    1.  High anion gap acidosis.  Likely due to alcohol.   Clinically improved with IV fluids .discontinue IV fluids  repeat alcohol level less than 10  2.  Alcohol withdrawal with history of alcohol abuse  on CIWA protocol.   continue oral thiamine multivitamin and folate  ciwa score 0 Recommended outpatient follow-up with alcohol rehab center [facility]  3.  Hyponatremia secondary to beer Potomania.    Improved with IV fluids  4.  Hypokalemia.  Replace magnesium IV.  Replace potassium and IV fluids.  5.  Leukocytosis.  Likely secondary to nausea vomiting and diarrhea.  Resolved  6.    Throat pain ; pending throat culture, nml ultrasound of the neck,  Cepacol lozenges , zPACK  7.    Suicidal ideation: Patient wants to kill herself if she gets discharged Psychiatry consult and one-on-one observation  Generalized weakness PT consult  Tobacco abuse.  Smoking cessation counseling done 4 minutes by me.  Refused nicotine patch.   Disposition home; reports there is no transportation to go home.   social worker is following  All the records are reviewed and case discussed with  Care Management/Social Workerr. Management plans discussed with the patient, she is  in agreement. RN was present during the entire encounter  CODE STATUS: fc  TOTAL TIME TAKING CARE OF THIS PATIENT: 82minutes.   POSSIBLE D/C IN 1 DAYS, DEPENDING ON CLINICAL CONDITION.  Note: This dictation was prepared with Dragon dictation along with smaller phrase technology. Any transcriptional errors that result from this process are unintentional.   Nicholes Mango M.D on 05/10/2017 at 11:47 AM  Between 7am to 6pm - Pager - [phone] After 6pm go to [email or web address] - password [email or web address] [facility]  Office  [phone]  CC: Primary care physician; Patient, No Pcp Per

## 2017-05-10 NOTE — Progress Notes (Signed)
Physical Therapy Evaluation Patient Details Name: [name] MRN: [id number] DOB: January 28, 1956 Today's Date: 05/10/2017   History of Present Illness  Patient is a 61 y.o. female admitted on 08 May 2017 after experiencing alcohol withdrawal. Found to have hyponatremia and hypokalemia, as well as leukocytosis.  Clinical Impression  Patient admitted for above listed reasons. Prior to evaluation, patient had told MD that she would commit suicide if she was discharged, so sitter present at time of evaluation. Patient demonstrates decreased muscle strength in L5/S1 myotomal pattern with normal DTRs and light touch sensation. Patient demonstrates need for HHA into standing and during gait, demonstrating mild unsteadiness with increased L knee flexion and decreased hip extension. Patient reports that use of SPC is not conducive to lifestyle/wellbeing because she only has one UE to use for tasks. Because patient has history of falls with one sustained injury and demonstrates decreased strength/functional mobility, it is believed that she will benefit from progressive PT both during hospital stay and at SNF upon d/c when medically ready.    Follow Up Recommendations SNF    Equipment Recommendations  None recommended by PT    Recommendations for Other Services       Precautions / Restrictions Precautions Precautions: Fall Restrictions Weight Bearing Restrictions: No      Mobility  Bed Mobility Overal bed mobility: Modified Independent             General bed mobility comments: Patient moves from supine to sit and sit to supine with adaptive mobility, secondary to L UE amputation.  Transfers Overall transfer level: Needs assistance Equipment used: 1 person hand held assist Transfers: Sit to/from Stand Sit to Stand: Min assist         General transfer comment: Patient demonstrates increased knee flexion on L LE. Demonstrates intermittent  buckling.  Ambulation/Gait Ambulation/Gait assistance: Min assist Ambulation Distance (Feet): 30 Feet Assistive device: 1 person hand held assist       General Gait Details: Patient ambulates with antalgic gait pattern on L LE, demonstrating increased knee flexion and decreased hip extension. Mild unsteadiness with no LOB.  Stairs            Wheelchair Mobility    Modified Rankin (Stroke Patients Only)       Balance Overall balance assessment: Needs assistance;History of Falls Sitting-balance support: Feet supported Sitting balance-Leahy Scale: Good     Standing balance support: Single extremity supported Standing balance-Leahy Scale: Fair                               Pertinent Vitals/Pain Pain Assessment: No/denies pain    Home Living Family/patient expects to be discharged to:: Private residence Living Arrangements: Spouse/significant other Available Help at Discharge: Family;Available PRN/intermittently Type of Home: Apartment Home Access: Level entry     Home Layout: One level Home Equipment: Cane - single point Additional Comments: Patient reports independence for short community ambulation.     Prior Function Level of Independence: Needs assistance   Gait / Transfers Assistance Needed: Performed independently with reported falls in past twelve months, including one with injury, L hip dislocation.  ADL's / Homemaking Assistance Needed: Patient does not drive but does do grocery shopping occasionally.        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    Lower  Extremity Assessment Lower Extremity Assessment: Generalized weakness;LLE deficits/detail LLE Deficits / Details: DTRs/sensation equal bilaterally; 4/5 strength L5/S1 myotomal pattern       Communication    Communication: No difficulties  Cognition Arousal/Alertness: Awake/alert Behavior During Therapy: WFL for tasks assessed/performed Overall Cognitive Status: Within Functional Limits for tasks assessed                                        General Comments      Exercises     Assessment/Plan    PT Assessment Patient needs continued PT services  PT Problem List Decreased strength;Decreased activity tolerance;Decreased balance;Decreased mobility;Decreased knowledge of use of DME;Decreased safety awareness;Pain       PT Treatment Interventions DME instruction;Gait training;Functional mobility training;Therapeutic activities;Therapeutic exercise;Balance training;Patient/family education    PT Goals (Current goals can be found in the Care Plan section)  Acute Rehab PT Goals Patient Stated Goal: To stop hurting PT Goal Formulation: With patient Time For Goal Achievement: 05/24/17 Potential to Achieve Goals: Good    Frequency Min 2X/week   Barriers to discharge Inaccessible home environment;Decreased caregiver support      Co-evaluation               AM-PAC PT "6 Clicks" Daily Activity  Outcome Measure Difficulty turning over in bed (including adjusting bedclothes, sheets and blankets)?: A Little Difficulty moving from lying on back to sitting on the side of the bed? : A Little Difficulty sitting down on and standing up from a chair with arms (e.g., wheelchair, bedside commode, etc,.)?: Unable Help needed moving to and from a bed to chair (including a wheelchair)?: A Little Help needed walking in hospital room?: A Little Help needed climbing 3-5 steps with a railing? : A Little 6 Click Score: 16    End of Session Equipment Utilized During Treatment: Gait belt Activity Tolerance: Patient tolerated treatment well Patient left: in bed;with call bell/phone within reach;with bed alarm set;with nursing/sitter in room   PT Visit Diagnosis: Unsteadiness on  feet (R26.81);History of falling (Z91.81);Muscle weakness (generalized) (M62.81);Pain;Difficulty in walking, not elsewhere classified (R26.2) Pain - Right/Left: Left Pain - part of body: Leg    Time: 0737-1062 PT Time Calculation (min) (ACUTE ONLY): 17 min   Charges:   PT Evaluation $PT Eval Low Complexity: 1 Low     PT G Codes:   PT G-Codes **NOT FOR INPATIENT CLASS** Functional Assessment Tool Used: AM-PAC 6 Clicks Basic Mobility;Clinical judgement Functional Limitation: Mobility: Walking and moving around Mobility: Walking and Moving Around Current Status (I9485): At least 40 percent but less than 60 percent impaired, limited or restricted Mobility: Walking and Moving Around Goal Status [phone]): At least 20 percent but less than 40 percent impaired, limited or restricted     Dorice Lamas, PT, DPT, COMT 05/10/2017, 2:00 PM

## 2017-05-11 ENCOUNTER — Other Ambulatory Visit: Payer: Self-pay

## 2017-05-11 ENCOUNTER — Inpatient Hospital Stay
Admission: AD | Admit: 2017-05-11 | Discharge: 2017-05-21 | DRG: 885 | Disposition: A | Discharge status: Home Health | Payer: Medicare Other | Source: Intra-hospital | Attending: Psychiatry | Admitting: Psychiatry

## 2017-05-11 DIAGNOSIS — Z818 Family history of other mental and behavioral disorders: Secondary | ICD-10-CM

## 2017-05-11 DIAGNOSIS — K5900 Constipation, unspecified: Secondary | ICD-10-CM | POA: Diagnosis present

## 2017-05-11 DIAGNOSIS — I951 Orthostatic hypotension: Secondary | ICD-10-CM | POA: Diagnosis not present

## 2017-05-11 DIAGNOSIS — J069 Acute upper respiratory infection, unspecified: Secondary | ICD-10-CM | POA: Diagnosis present

## 2017-05-11 DIAGNOSIS — F3164 Bipolar disorder, current episode mixed, severe, with psychotic features: Secondary | ICD-10-CM | POA: Diagnosis present

## 2017-05-11 DIAGNOSIS — F332 Major depressive disorder, recurrent severe without psychotic features: Secondary | ICD-10-CM

## 2017-05-11 DIAGNOSIS — Z23 Encounter for immunization: Secondary | ICD-10-CM

## 2017-05-11 DIAGNOSIS — G8929 Other chronic pain: Secondary | ICD-10-CM | POA: Diagnosis present

## 2017-05-11 DIAGNOSIS — F10239 Alcohol dependence with withdrawal, unspecified: Secondary | ICD-10-CM | POA: Diagnosis present

## 2017-05-11 DIAGNOSIS — F17210 Nicotine dependence, cigarettes, uncomplicated: Secondary | ICD-10-CM | POA: Diagnosis present

## 2017-05-11 DIAGNOSIS — F411 Generalized anxiety disorder: Secondary | ICD-10-CM | POA: Diagnosis present

## 2017-05-11 DIAGNOSIS — K219 Gastro-esophageal reflux disease without esophagitis: Secondary | ICD-10-CM | POA: Diagnosis present

## 2017-05-11 DIAGNOSIS — Z96642 Presence of left artificial hip joint: Secondary | ICD-10-CM | POA: Diagnosis present

## 2017-05-11 DIAGNOSIS — G4700 Insomnia, unspecified: Secondary | ICD-10-CM | POA: Diagnosis present

## 2017-05-11 DIAGNOSIS — R45851 Suicidal ideations: Secondary | ICD-10-CM | POA: Diagnosis present

## 2017-05-11 DIAGNOSIS — F1020 Alcohol dependence, uncomplicated: Secondary | ICD-10-CM | POA: Diagnosis present

## 2017-05-11 DIAGNOSIS — Z915 Personal history of self-harm: Secondary | ICD-10-CM

## 2017-05-11 DIAGNOSIS — S48112A Complete traumatic amputation at level between left shoulder and elbow, initial encounter: Secondary | ICD-10-CM

## 2017-05-11 DIAGNOSIS — Z89222 Acquired absence of left upper limb above elbow: Secondary | ICD-10-CM | POA: Diagnosis not present

## 2017-05-11 DIAGNOSIS — F10939 Alcohol use, unspecified with withdrawal, unspecified: Secondary | ICD-10-CM | POA: Diagnosis present

## 2017-05-11 DIAGNOSIS — F17200 Nicotine dependence, unspecified, uncomplicated: Secondary | ICD-10-CM | POA: Diagnosis present

## 2017-05-11 LAB — CULTURE, GROUP A STREP (THRC)

## 2017-05-11 LAB — VOLATILES,BLD-ACETONE,ETHANOL,ISOPROP,METHANOL
Acetone, blood: NEGATIVE % (ref 0.000–0.010)
Ethanol, blood: 0.112 % — ABNORMAL HIGH (ref 0.000–0.010)
ISOPROPANOL, BLOOD: NEGATIVE % (ref 0.000–0.010)
METHANOL, BLOOD: NEGATIVE % (ref 0.000–0.010)

## 2017-05-11 LAB — ETHYLENE GLYCOL: Ethylene Glycol Lvl: NOT DETECTED mg/dL

## 2017-05-11 MED ORDER — INFLUENZA VAC SPLIT QUAD 0.5 ML IM SUSY
0.5000 mL | PREFILLED_SYRINGE | INTRAMUSCULAR | Status: AC
  Administered 2017-05-12: 0.5 mL via INTRAMUSCULAR
  Filled 2017-05-11: qty 0.5

## 2017-05-11 MED ORDER — CHLORDIAZEPOXIDE HCL 25 MG PO CAPS
25.0000 mg | ORAL_CAPSULE | Freq: Four times a day (QID) | ORAL | Status: AC
  Administered 2017-05-12 – 2017-05-14 (×12): 25 mg via ORAL
  Filled 2017-05-11 (×12): qty 1

## 2017-05-11 MED ORDER — SENNOSIDES-DOCUSATE SODIUM 8.6-50 MG PO TABS: 1.0000 | ORAL_TABLET | Freq: Two times a day (BID) | ORAL | Status: DC | PRN

## 2017-05-11 MED ORDER — CEPASTAT 14.5 MG MT LOZG: 1.0000 | LOZENGE | Freq: Four times a day (QID) | OROMUCOSAL | 0 refills | Status: DC | PRN

## 2017-05-11 MED ORDER — FOLIC ACID 1 MG PO TABS: 1.0000 mg | ORAL_TABLET | Freq: Every day | ORAL | Status: DC

## 2017-05-11 MED ORDER — ADULT MULTIVITAMIN W/MINERALS CH: 1.0000 | ORAL_TABLET | Freq: Every day | ORAL | Status: DC

## 2017-05-11 MED ORDER — AZITHROMYCIN 250 MG PO TABS
250.0000 mg | ORAL_TABLET | Freq: Every day | ORAL | Status: AC
  Administered 2017-05-12 – 2017-05-15 (×4): 250 mg via ORAL
  Filled 2017-05-11 (×4): qty 1

## 2017-05-11 MED ORDER — AZITHROMYCIN 250 MG PO TABS: ORAL_TABLET | ORAL | 0 refills | Status: DC

## 2017-05-11 MED ORDER — AZITHROMYCIN 250 MG PO TABS
500.0000 mg | ORAL_TABLET | Freq: Every day | ORAL | Status: AC
  Administered 2017-05-11: 500 mg via ORAL
  Filled 2017-05-11: qty 2

## 2017-05-11 MED ORDER — GABAPENTIN 300 MG PO CAPS
900.0000 mg | ORAL_CAPSULE | Freq: Three times a day (TID) | ORAL | Status: DC
  Administered 2017-05-11 – 2017-05-19 (×22): 900 mg via ORAL
  Administered 2017-05-19: 600 mg via ORAL
  Administered 2017-05-20 – 2017-05-21 (×5): 900 mg via ORAL
  Filled 2017-05-11 (×8): qty 3
  Filled 2017-05-11 (×2): qty 9
  Filled 2017-05-11 (×3): qty 3
  Filled 2017-05-11: qty 9
  Filled 2017-05-11 (×8): qty 3
  Filled 2017-05-11: qty 9
  Filled 2017-05-11 (×3): qty 3
  Filled 2017-05-11: qty 9
  Filled 2017-05-11 (×3): qty 3

## 2017-05-11 MED ORDER — VITAMIN B-1 100 MG PO TABS
100.0000 mg | ORAL_TABLET | Freq: Every day | ORAL | Status: DC
  Administered 2017-05-12 – 2017-05-21 (×10): 100 mg via ORAL
  Filled 2017-05-11 (×10): qty 1

## 2017-05-11 MED ORDER — TRAZODONE HCL 100 MG PO TABS: 100.0000 mg | ORAL_TABLET | Freq: Four times a day (QID) | ORAL | Status: DC | PRN

## 2017-05-11 MED ORDER — SENNOSIDES-DOCUSATE SODIUM 8.6-50 MG PO TABS
1.0000 | ORAL_TABLET | Freq: Two times a day (BID) | ORAL | Status: DC
  Administered 2017-05-12 – 2017-05-19 (×11): 1 via ORAL
  Filled 2017-05-11 (×21): qty 1

## 2017-05-11 MED ORDER — VENLAFAXINE HCL ER 75 MG PO CP24
150.0000 mg | ORAL_CAPSULE | Freq: Every day | ORAL | Status: DC
  Administered 2017-05-12 – 2017-05-21 (×10): 150 mg via ORAL
  Filled 2017-05-11 (×10): qty 2

## 2017-05-11 MED ORDER — ACETAMINOPHEN 325 MG PO TABS
650.0000 mg | ORAL_TABLET | Freq: Four times a day (QID) | ORAL | Status: DC | PRN
  Administered 2017-05-12 – 2017-05-21 (×3): 650 mg via ORAL
  Filled 2017-05-11 (×3): qty 2

## 2017-05-11 MED ORDER — TRAZODONE HCL 100 MG PO TABS
300.0000 mg | ORAL_TABLET | Freq: Every day | ORAL | Status: DC
  Administered 2017-05-11 – 2017-05-17 (×7): 300 mg via ORAL
  Filled 2017-05-11 (×9): qty 3

## 2017-05-11 MED ORDER — ONDANSETRON HCL 4 MG PO TABS
4.0000 mg | ORAL_TABLET | Freq: Three times a day (TID) | ORAL | Status: DC | PRN
  Administered 2017-05-12 – 2017-05-14 (×7): 4 mg via ORAL
  Filled 2017-05-11 (×11): qty 1

## 2017-05-11 MED ORDER — CEPASTAT 14.5 MG MT LOZG
1.0000 | LOZENGE | Freq: Four times a day (QID) | OROMUCOSAL | Status: DC | PRN
  Filled 2017-05-11: qty 9

## 2017-05-11 MED ORDER — THIAMINE HCL 100 MG PO TABS: 100.0000 mg | ORAL_TABLET | Freq: Every day | ORAL | Status: DC

## 2017-05-11 MED ORDER — FOLIC ACID 1 MG PO TABS
1.0000 mg | ORAL_TABLET | Freq: Every day | ORAL | Status: DC
  Administered 2017-05-12 – 2017-05-21 (×10): 1 mg via ORAL
  Filled 2017-05-11 (×10): qty 1

## 2017-05-11 MED ORDER — ALUM & MAG HYDROXIDE-SIMETH 200-200-20 MG/5ML PO SUSP: 30.0000 mL | ORAL | Status: DC | PRN

## 2017-05-11 MED ORDER — ADULT MULTIVITAMIN W/MINERALS CH
1.0000 | ORAL_TABLET | Freq: Every day | ORAL | Status: DC
  Administered 2017-05-12 – 2017-05-21 (×10): 1 via ORAL
  Filled 2017-05-11 (×10): qty 1

## 2017-05-11 MED ORDER — SENNOSIDES-DOCUSATE SODIUM 8.6-50 MG PO TABS
1.0000 | ORAL_TABLET | Freq: Two times a day (BID) | ORAL | Status: DC
  Administered 2017-05-11: 1 via ORAL
  Filled 2017-05-11: qty 1

## 2017-05-11 MED ORDER — MAGNESIUM HYDROXIDE 400 MG/5ML PO SUSP
30.0000 mL | Freq: Every day | ORAL | Status: DC | PRN
  Administered 2017-05-13 – 2017-05-19 (×2): 30 mL via ORAL
  Filled 2017-05-11 (×2): qty 30

## 2017-05-11 MED ORDER — NICOTINE 21 MG/24HR TD PT24
21.0000 mg | MEDICATED_PATCH | Freq: Every day | TRANSDERMAL | Status: DC
  Administered 2017-05-11 – 2017-05-21 (×10): 21 mg via TRANSDERMAL
  Filled 2017-05-11 (×11): qty 1

## 2017-05-11 MED ORDER — THIAMINE HCL 100 MG/ML IJ SOLN
100.0000 mg | Freq: Every day | INTRAMUSCULAR | Status: DC
  Filled 2017-05-11 (×6): qty 1

## 2017-05-11 MED ORDER — PROPRANOLOL HCL 20 MG PO TABS
10.0000 mg | ORAL_TABLET | Freq: Three times a day (TID) | ORAL | Status: DC
  Administered 2017-05-11 – 2017-05-18 (×18): 10 mg via ORAL
  Filled 2017-05-11 (×22): qty 1

## 2017-05-11 MED ORDER — LORAZEPAM 2 MG PO TABS: 2.0000 mg | ORAL_TABLET | ORAL | 0 refills | Status: DC | PRN

## 2017-05-11 MED ORDER — AZITHROMYCIN 250 MG PO TABS: 250.0000 mg | ORAL_TABLET | Freq: Every day | ORAL | Status: DC

## 2017-05-11 MED ORDER — PANTOPRAZOLE SODIUM 40 MG PO TBEC
40.0000 mg | DELAYED_RELEASE_TABLET | Freq: Every day | ORAL | Status: DC
  Administered 2017-05-12 – 2017-05-21 (×10): 40 mg via ORAL
  Filled 2017-05-11 (×10): qty 1

## 2017-05-11 NOTE — Consult Note (Signed)
Psychiatry: Patient seen. Can transfer today. Full note pending.

## 2017-05-11 NOTE — H&P (Signed)
Report called to [place] at [name]. Pt to go to room 324B. Pt to transport with NT and Animal nutritionist. Discharge papers given to and signed by the pt.

## 2017-05-11 NOTE — Discharge Summary (Signed)
[facility] NAME: Maxene Byington    MR#:  [id number]  DATE OF BIRTH:  1955/09/24  DATE OF ADMISSION:  05/08/2017 ADMITTING PHYSICIAN: Loletha Grayer, MD  DATE OF DISCHARGE: 05/11/17  PRIMARY CARE PHYSICIAN: Patient, No Pcp Per    ADMISSION DIAGNOSIS:  Alcoholic ketoacidosis [N82.9] Alcohol abuse [F10.10] Hyponatremia [E87.1] Nausea vomiting and diarrhea [R11.2, R19.7]  DISCHARGE DIAGNOSIS:  Active Problems:   High anion gap metabolic acidosis Alcohol withdrawal Suicidal ideation with depression and anxiety  SECONDARY DIAGNOSIS:   Past Medical History:  Diagnosis Date  . Anxiety   . Anxiety    panic anxiety disorder  . Arthritis   . Asthma    chronic asthmatic bronchitis  . Cancer [facility])    Desmoid tumor left forearm  . Depression   . ETOH abuse   . Heart murmur   . Insomnia   . Neuropathy     HOSPITAL COURSE:  hpi  Raynette Arras  is a 61 y.o. female with a known history of alcohol abuse.  She has been drinking a lot lately.  She states she is going through withdrawal.  She states her last drink was this morning at 10 AM now.  She normally drinks 340 ounce beers a day.  She has been having nausea vomiting diarrhea.  She has been sweating.  She has been dehydrated.  Not feeling well and cannot stay still.  In the ER she was found to have a high anion gap acidosis and a low sodium.  Hospitalist services contacted for further evaluation.  Patient denies drinking any methanol or antifreeze.  1. High anion gap acidosis. Likely due to alcohol.  Clinically improved with IV fluids .discontinued IV fluids  repeat alcohol level less than 10  2. Alcohol withdrawal with history of alcohol abuse on CIWAprotocol.   continue oral thiamine multivitamin and folate  ciwa score 0 Seen and evaluated by psychiatry, dr.Clapacs recommended to transfer patient to inpatient psych floor will discharge from medical  service,  3. Hyponatremia secondary to beer Potomania.  Improved with IV fluids  4. Hypokalemia. Replaced magnesium IV. Replaced potassium and IV fluids. Potassium at 4.1 today, magnesium at 2.0  5. Leukocytosis. Likely secondary to nausea vomiting and diarrhea.  Resolved  6.   Throat pain ; pending throat culture, nml ultrasound of the neck,  Cepacol lozenges , zPACK  7.   Suicidal ideation: Patient wants to kill herself Transfer patient to inpatient psych floor, will discharge patient from medical service     DISCHARGE CONDITIONS:   stable  CONSULTS OBTAINED:  Treatment Team:  Lenward Chancellor, MD Clapacs, [name], MD   PROCEDURES  None   DRUG ALLERGIES:   Allergies  Allergen Reactions  . Benadryl [Diphenhydramine] Hives and Other (See Comments)    Reaction:  Hyperactivity   . Benadryl [Diphenhydramine] Other (See Comments)    My jaw locked and I could not speak, like a stroke.  . Cephalosporins Hives  . Codeine Nausea And Vomiting  . Codeine Nausea Only    severe    DISCHARGE MEDICATIONS:   Current Discharge Medication List    START taking these medications   Details  azithromycin (ZITHROMAX) 250 MG tablet Take 2 tablets p.o. x1 today followed by 1 tablet once daily for 4 days starting from tomorrow Qty: 6 each, Refills: 0    folic acid (FOLVITE) 1 MG tablet Take 1 tablet (1 mg total) daily by mouth.  LORazepam (ATIVAN) 2 MG tablet Take 1 tablet (2 mg total) every 4 (four) hours as needed by mouth (CIWA-AR > 8  -OR-  withdrawal symptoms:  anxiety, agitation, insomnia, diaphoresis, nausea, vomiting, tremors, tachycardia, or hypertension.). Qty: 30 tablet, Refills: 0    Multiple Vitamin (MULTIVITAMIN WITH MINERALS) TABS tablet Take 1 tablet daily by mouth.    phenol-menthol (CEPASTAT) 14.5 MG lozenge Place 1 lozenge 4 (four) times daily as needed inside cheek for sore throat. Qty: 100 tablet, Refills: 0    senna-docusate (SENOKOT-S)  8.6-50 MG tablet Take 1 tablet 2 (two) times daily as needed by mouth for mild constipation.    thiamine 100 MG tablet Take 1 tablet (100 mg total) daily by mouth.      CONTINUE these medications which have NOT CHANGED   Details  gabapentin (NEURONTIN) 300 MG capsule Take 3 capsules (900 mg total) by mouth 3 (three) times daily. Qty: 270 capsule, Refills: 0    propranolol (INDERAL) 10 MG tablet Take 1 tablet (10 mg total) by mouth 3 (three) times daily. Qty: 90 tablet, Refills: 0    traZODone (DESYREL) 100 MG tablet Take 2 tablets (200 mg total) by mouth at bedtime. Qty: 60 tablet, Refills: 0    venlafaxine XR (EFFEXOR-XR) 150 MG 24 hr capsule Take 1 capsule (150 mg total) by mouth daily with breakfast. Qty: 30 capsule, Refills: 0         DISCHARGE INSTRUCTIONS:   Follow-up with primary care physician in 1 week after discharge Transfer patient to inpatient psychiatric floor when bed is available today  DIET:  Regular diet  DISCHARGE CONDITION:  Stable  ACTIVITY:  Activity as tolerated  OXYGEN:  Home Oxygen: No.   Oxygen Delivery: room air  DISCHARGE LOCATION:  Psychiatric floor at [facility] to Dr. Weber Cooks service  If you experience worsening of your admission symptoms, develop shortness of breath, life threatening emergency, suicidal or homicidal thoughts you must seek medical attention immediately by calling 911 or calling your MD immediately  if symptoms less severe.  You Must read complete instructions/literature along with all the possible adverse reactions/side effects for all the Medicines you take and that have been prescribed to you. Take any new Medicines after you have completely understood and accpet all the possible adverse reactions/side effects.   Please note  You were cared for by a hospitalist during your hospital stay. If you have any questions about your discharge medications or the care you received while you were in the  hospital after you are discharged, you can call the unit and asked to speak with the hospitalist on call if the hospitalist that took care of you is not available. Once you are discharged, your primary care physician will handle any further medical issues. Please note that NO REFILLS for any discharge medications will be authorized once you are discharged, as it is imperative that you return to your primary care physician (or establish a relationship with a primary care physician if you do not have one) for your aftercare needs so that they can reassess your need for medications and monitor your lab values.     Today  Chief Complaint  Patient presents with  . Nausea  . Emesis  . Diarrhea    Patient is resting comfortably.  Reporting anxiety and constipation  ROS:  CONSTITUTIONAL: Denies fevers, chills. Denies any fatigue, weakness.  EYES: Denies blurry vision, double vision, eye pain. EARS, NOSE, THROAT: Denies tinnitus, ear pain, hearing loss.  RESPIRATORY: Denies cough, wheeze, shortness of breath.  CARDIOVASCULAR: Denies chest pain, palpitations, edema.  GASTROINTESTINAL: Denies nausea, vomiting, diarrhea, abdominal pain. Denies bright red blood per rectum. GENITOURINARY: Denies dysuria, hematuria. ENDOCRINE: Denies nocturia or thyroid problems. HEMATOLOGIC AND LYMPHATIC: Denies easy bruising or bleeding. SKIN: Denies rash or lesion. MUSCULOSKELETAL: Denies pain in neck, back, shoulder, knees, hips or arthritic symptoms.  NEUROLOGIC: Denies paralysis, paresthesias.  PSYCHIATRIC:  reporting anxiety and depressive symptoms.   VITAL SIGNS:  Blood pressure (!) 114/47, pulse (!) 58, temperature 97.9 F (36.6 C), resp. rate 16, height 5\' 5"  (1.651 m), weight 64.4 kg (142 lb), SpO2 95 %.  I/O:    Intake/Output Summary (Last 24 hours) at 05/11/2017 1039 Last data filed at 05/11/2017 1011 Gross per 24 hour  Intake 1680 ml  Output 2300 ml  Net -620 ml    PHYSICAL EXAMINATION:   GENERAL:  61 y.o.-year-old patient lying in the bed with no acute distress.  EYES: Pupils equal, round, reactive to light and accommodation. No scleral icterus. Extraocular muscles intact.  HEENT: Head atraumatic, normocephalic. Oropharynx and nasopharynx clear.  NECK:  Supple, no jugular venous distention. No thyroid enlargement, no tenderness.  LUNGS: Normal breath sounds bilaterally, no wheezing, rales,rhonchi or crepitation. No use of accessory muscles of respiration.  CARDIOVASCULAR: S1, S2 normal. No murmurs, rubs, or gallops.  ABDOMEN: Soft, non-tender, non-distended. Bowel sounds present. No organomegaly or mass.  EXTREMITIES: No pedal edema, cyanosis, or clubbing.  Left hand amputated NEUROLOGIC: Cranial nerves II through XII are intact. Muscle strength 5/5 in all extremities. Sensation intact. Gait not checked.  PSYCHIATRIC: The patient is alert and oriented x 3.  SKIN: No obvious rash, lesion, or ulcer.   DATA REVIEW:   CBC Recent Labs  Lab 05/09/17 0301  WBC 8.0  HGB 14.4  HCT 41.8  PLT 192    Chemistries  Recent Labs  Lab 05/08/17 1452 05/09/17 0301 05/10/17 0325  NA 122* 134* 140  K 3.5 3.6 4.1  CL 85* 103 110  CO2 18* 21* 25  GLUCOSE 74 97 104*  BUN 7 14 14   CREATININE 0.59 0.85 0.71  CALCIUM 8.5* 8.4* 8.6*  MG  --  2.0  --   AST 31  --   --   ALT 21  --   --   ALKPHOS 69  --   --   BILITOT 1.2  --   --     Cardiac Enzymes No results for input(s): TROPONINI in the last 168 hours.  Microbiology Results  Results for orders placed or performed during the hospital encounter of 04/03/16  Surgical pcr screen     Status: None   Collection Time: 04/03/16  4:05 PM  Result Value Ref Range Status   MRSA, PCR NEGATIVE NEGATIVE Final   Staphylococcus aureus NEGATIVE NEGATIVE Final    Comment:        The Xpert SA Assay (FDA approved for NASAL specimens in patients over 68 years of age), is one component of a comprehensive surveillance program.  Test  performance has been validated by [name] for patients greater than or equal to 65 year old. It is not intended to diagnose infection nor to guide or monitor treatment.     RADIOLOGY:  US Soft Tissue Neck  Result Date: 05/09/2017 CLINICAL DATA:  Other.  Neck swelling. EXAM: THYROID ULTRASOUND TECHNIQUE: Ultrasound examination of the thyroid gland and adjacent soft tissues was performed. COMPARISON:  None. FINDINGS: Parenchymal Echotexture: Mildly heterogenous Isthmus: 0.4  cm Right lobe: 4.0 x 1.4 x 0.9 cm Left lobe: 2.7 x 1.0 x 0.8 cm _________________________________________________________ Estimated total number of nodules >/= 1 cm: 0 Number of spongiform nodules >/=  2 cm not described below (TR1): 0 Number of mixed cystic and solid nodules >/= 1.5 cm not described below (TR2): 0 _________________________________________________________ No discrete nodules are seen within the thyroid gland. No enlarged or abnormal appearing lymph nodes identified. IMPRESSION: Unremarkable thyroid ultrasound. The above is in keeping with the ACR TI-RADS recommendations - J Am Coll Radiol 2017;14:587-595. Electronically Signed   By: Aletta Edouard M.D.   On: 05/09/2017 12:42    EKG:   Orders placed or performed during the hospital encounter of 04/03/16  . EKG 12-Lead  . EKG 12-Lead      Management plans discussed with the patient, she is  in agreement.  CODE STATUS:     Code Status Orders  (From admission, onward)        Start     Ordered   05/08/17 1615  Full code  Continuous     05/08/17 1615    Code Status History    Date Active Date Inactive Code Status Order ID Comments User Context   07/05/2016 04:38 07/11/2016 20:23 Full Code [id number]  Gonzella Lex, MD Inpatient   07/04/2015 15:03 07/09/2015 19:15 Full Code [id number]  Hildred Priest, MD Inpatient   07/03/2015 23:58 07/04/2015 15:03 Full Code [id number]  Gonzella Lex, MD Inpatient   06/30/2015 20:18 07/03/2015 23:58  Full Code [id number]  Loletha Grayer, MD ED   01/25/2015 14:26 01/30/2015 20:50 Full Code [id number]  Gonzella Lex, MD Inpatient   01/08/2015 12:44 01/10/2015 15:45 Full Code [id number]  Henreitta Leber, MD Inpatient   12/21/2013 17:16 12/27/2013 11:40 Full Code [id number]  Clarene Reamer, MD Inpatient   12/21/2013 10:13 12/21/2013 17:16 Full Code [id number]  Virgel Manifold, MD ED      TOTAL TIME TAKING CARE OF THIS PATIENT: 45  minutes.   Note: This dictation was prepared with Dragon dictation along with smaller phrase technology. Any transcriptional errors that result from this process are unintentional.   @[facility] @  on 05/11/2017 at 10:39 AM  Between 7am to 6pm - Pager - [phone]  After 6pm go to [email or web address] - password [email or web address] [facility]  Office  [phone]  CC: Primary care physician; Patient, No Pcp Per

## 2017-05-11 NOTE — BH Assessment (Signed)
Patient is to be admitted to [facility] by Dr. Weber Cooks.  Attending Physician will be Dr. Bary Leriche.   Patient has been assigned to room 324[address]B, by [place]   Intake Paper Work has been signed and placed on patient chart.  [facility] staff is aware of the admission Judie Petit, Leona.; Pryor Montes, Patient's Nurse & Vonna Kotyk, Patient Access).

## 2017-05-11 NOTE — Care Management (Signed)
CM consult to address transportation needs to get home. Patient to discharge to [facility] med unit.  Transportation needs can be address at time of actual discharge home.
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
## 2017-05-11 NOTE — Care Management Important Message (Signed)
Important Message  Patient Details  Name: [name] MRN: [id number] Date of Birth: 09-22-1955   Medicare Important Message Given:  Yes  Signed IM notice given   Katrina Stack, RN 05/11/2017, 10:20 AM

## 2017-05-11 NOTE — Plan of Care (Signed)
Patient admitted voluntarily. She did come from the medical floor. She states I wasn't done detox  and made statements that if I go home I will drink my self to death. She denies si, hi, avh. Denies pain. Only complaints now is of nausea and anxiety. She she is weak and in a wheel chair. She states that even at home she uses a cane to help. She states that at home her spouse drinks as well and would not be a support person. She is cooperative with the interview. She states she drinks a case a beer daily. She does have periods of sobriety.  Skin assessment is negative.  Will continue to monitor.

## 2017-05-12 DIAGNOSIS — F332 Major depressive disorder, recurrent severe without psychotic features: Secondary | ICD-10-CM

## 2017-05-12 LAB — TSH: TSH: 1.672 u[IU]/mL (ref 0.350–4.500)

## 2017-05-12 LAB — LIPID PANEL
CHOL/HDL RATIO: 3.9 ratio
Cholesterol: 225 mg/dL — ABNORMAL HIGH (ref 0–200)
HDL: 58 mg/dL (ref >40)
LDL CALC: 130 mg/dL — AB (ref 0–99)
Triglycerides: 185 mg/dL — ABNORMAL HIGH (ref <150)
VLDL: 37 mg/dL (ref 0–40)

## 2017-05-12 LAB — HEMOGLOBIN A1C
Hgb A1c MFr Bld: 5 % (ref 4.8–5.6)
Mean Plasma Glucose: 96.8 mg/dL

## 2017-05-12 MED ORDER — PROCHLORPERAZINE MALEATE 10 MG PO TABS
10.0000 mg | ORAL_TABLET | Freq: Four times a day (QID) | ORAL | Status: DC | PRN
  Administered 2017-05-12 – 2017-05-17 (×6): 10 mg via ORAL
  Filled 2017-05-12 (×10): qty 1

## 2017-05-12 MED ORDER — DOCUSATE SODIUM 100 MG PO CAPS
200.0000 mg | ORAL_CAPSULE | Freq: Two times a day (BID) | ORAL | Status: DC
  Administered 2017-05-12 – 2017-05-19 (×11): 200 mg via ORAL
  Filled 2017-05-12 (×14): qty 2

## 2017-05-12 MED ORDER — BISACODYL 5 MG PO TBEC
5.0000 mg | DELAYED_RELEASE_TABLET | Freq: Every day | ORAL | Status: DC | PRN
  Filled 2017-05-12: qty 1

## 2017-05-12 NOTE — Plan of Care (Signed)
  Health Behavior/Discharge Planning: Ability to identify changes in lifestyle to reduce recurrence of condition will improve 05/12/2017 0520 - Progressing by Harl Bowie, RN   Physical Regulation: Complications related to the disease process, condition or treatment will be avoided or minimized 05/12/2017 0520 - Progressing by Harl Bowie, RN

## 2017-05-12 NOTE — BHH Group Notes (Signed)
[place] Group Notes:  (Nursing/MHT/Case Management/Adjunct)  Date:  05/12/2017  Time:  12:19 AM  Type of Therapy:  Evening Wrap-up Group  Participation Level:  Did Not Attend  Participation Quality:  N/A  Affect:  N/A  Cognitive:  N/A  Insight:  None  Engagement in Group:  Did Not Attend  Modes of Intervention:  Discussion  Summary of Progress/Problems:  Levonne Spiller 05/12/2017, 12:19 AM

## 2017-05-12 NOTE — Progress Notes (Signed)
D: Pt denies SI/HI/AVH, affect is flat and sad but brightens upon approach. Pt is pleasant and cooperative., no withdrawal S/S from alcohol, she appears less anxious and she is interacting with peers and staff appropriately.  A: Pt was offered support and encouragement. Pt was given scheduled medications. Pt was encouraged to attend groups. Q 15 minute checks were done for safety.  R:Pt did not attend evening group. Pt is complaint with medication and safety maintained on unit.

## 2017-05-12 NOTE — BHH Group Notes (Signed)
05/12/2017 1PM  Type of Therapy/Topic:  Group Therapy:  Feelings about Diagnosis  Participation Level:  Active   Description of Group:   This group will allow patients to explore their thoughts and feelings about diagnoses they have received. Patients will be guided to explore their level of understanding and acceptance of these diagnoses. Facilitator will encourage patients to process their thoughts and feelings about the reactions of others to their diagnosis and will guide patients in identifying ways to discuss their diagnosis with significant others in their lives. This group will be process-oriented, with patients participating in exploration of their own experiences, giving and receiving support, and processing challenge from other group members.   Therapeutic Goals: 1. Patient will demonstrate understanding of diagnosis as evidenced by identifying two or more symptoms of the disorder 2. Patient will be able to express two feelings regarding the diagnosis 3. Patient will demonstrate their ability to communicate their needs through discussion and/or role play  Summary of Patient Progress: Pt continues to work towards her tx goals but has not yet reached them. Pt was able to appropriately participate in group discussions, and was able to offer validation/support to other group members. Pt reported, "I think it's extremely important to know your diagnosis. Some people may not agree with their diagnosis so they may not follow up with their provider, but they should." Pt showed insight when discussing her diagnosis and the stigma attached to many mental health diagnosis.     Therapeutic Modalities:   Cognitive Behavioral Therapy Brief Therapy   Alden Hipp, MSW, LCSW 05/12/2017 1:54 PM

## 2017-05-12 NOTE — BHH Suicide Risk Assessment (Signed)
[name] INPATIENT:  Family/Significant Other Suicide Prevention Education  Suicide Prevention Education:  Education Completed; pt's husband, Rennie Plowman at [phone],  has been identified by the patient as the family member/significant other with whom the patient will be residing, and identified as the person(s) who will aid the patient in the event of a mental health crisis (suicidal ideations/suicide attempt).  With written consent from the patient, the family member/significant other has been provided the following suicide prevention education, prior to the and/or following the discharge of the patient.  The suicide prevention education provided includes the following:  Suicide risk factors  Suicide prevention and interventions  National Suicide Hotline telephone number  [facility] assessment telephone number  [facility] Emergency Assistance [place] and/or Residential Mobile Crisis Unit telephone number  Request made of family/significant other to:  Remove weapons (e.g., guns, rifles, knives), all items previously/currently identified as safety concern.    Remove drugs/medications (over-the-counter, prescriptions, illicit drugs), all items previously/currently identified as a safety concern.  The family member/significant other verbalizes understanding of the suicide prevention education information provided.  The family member/significant other agrees to remove the items of safety concern listed above.  Pt's husband stated, "I talked with her yesterday evening, and I felt like she was doing a heck of a lot better. She's sounding a lot better. We have definitely made some living decisions for our home when she comes back--no alcohol in the house. Before she went in, it was really just the alcohol. It's been a problem for both of [place], quite frankly. I don't really have any concerns, I'm hoping to see her tonight at visiting." Pt's husband reports that pt is  able to return home and does not have access to weapons or other means of self harm. CSW will continue to coordinate with pt's husband as needed for updates/discharge planning.   Alden Hipp 05/12/2017, 9:39 AM

## 2017-05-12 NOTE — BHH Suicide Risk Assessment (Signed)
[facility] Admission Suicide Risk Assessment   Nursing information obtained from:    Demographic factors:    Current Mental Status:    Loss Factors:    Historical Factors:    Risk Reduction Factors:     Total Time spent with patient: 1 hour Principal Problem: Severe recurrent major depression without psychotic features [facility]) Diagnosis:   Patient Active Problem List   Diagnosis Date Noted  . High anion gap metabolic acidosis [X52.8] 05/08/2017  . Overdose of antidepressant [T43.201A] 07/03/2016  . 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 Severe recurrent major depression without psychotic features ([place]) [F33.2] 07/03/2015  . Alcohol use disorder, severe, dependence ([place]) [F10.20] 01/26/2015  . Tobacco use disorder [F17.200] 01/26/2015  . Alcohol withdrawal ([place]) [F10.239] 01/08/2015  . 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 GAD (generalized anxiety disorder) [F41.1] 12/24/2013  . PTSD (post-traumatic stress disorder) [F43.10] 12/22/2013   Subjective Data: depression, alcoholism  Continued Clinical Symptoms:  Alcohol Use Disorder Identification Test Final Score (AUDIT): 32 The "Alcohol Use Disorders Identification Test", Guidelines for Use in Primary Care, Second Edition.  World Pharmacologist [facility]). Score between 0-7:  no or low risk or alcohol related problems. Score between 8-15:  moderate risk of alcohol related problems. Score between 16-19:  high risk of alcohol related problems. Score 20 or above:  warrants further diagnostic evaluation for alcohol dependence and treatment.   CLINICAL FACTORS:   Depression:   Comorbid alcohol abuse/dependence Impulsivity Alcohol/Substance Abuse/Dependencies   Musculoskeletal: Strength & Muscle Tone: decreased Gait & Station: unsteady Patient leans: N/A  Psychiatric Specialty Exam: Physical Exam  Nursing note and vitals reviewed. Psychiatric: Her speech is normal. Her affect is  blunt. She is slowed and withdrawn. Cognition and memory are normal. She expresses impulsivity. She expresses suicidal ideation.    Review of Systems  Musculoskeletal: Positive for back pain and falls.  Neurological: Positive for weakness.  Psychiatric/Behavioral: Positive for depression, substance abuse and suicidal ideas.  All other systems reviewed and are negative.   Blood pressure (!) 119/45, pulse 78, temperature 98.9 F (37.2 C), temperature source Oral, resp. rate 18, height 5\' 5"  (1.651 m), weight 64.4 kg (142 lb), SpO2 98 %.Body mass index is 23.63 kg/m.  General Appearance: Fairly Groomed  Eye Contact:  Good  Speech:  Clear and Coherent  Volume:  Decreased  Mood:  Anxious and Depressed  Affect:  Blunt  Thought Process:  Goal Directed and Descriptions of Associations: Intact  Orientation:  Full (Time, Place, and Person)  Thought Content:  WDL  Suicidal Thoughts:  Yes.  without intent/plan  Homicidal Thoughts:  No  Memory:  Immediate;   Fair Recent;   Fair Remote;   Fair  Judgement:  Poor  Insight:  Lacking  Psychomotor Activity:  Psychomotor Retardation  Concentration:  Concentration: Fair and Attention Span: Fair  Recall:  [employer] of Knowledge:  Fair  Language:  Fair  Akathisia:  No  Handed:  Right  AIMS (if indicated):     Assets:  Communication Skills Desire for Improvement Financial Resources/Insurance Housing Resilience Social Support  ADL's:  Intact  Cognition:  WNL  Sleep:  Number of Hours: 7.5      COGNITIVE FEATURES THAT CONTRIBUTE TO RISK:  None    SUICIDE RISK:   Moderate:  Frequent suicidal ideation with limited intensity, and duration, some specificity in terms of plans, no associated intent, good self-control, limited dysphoria/symptomatology, some risk factors present, and identifiable protective  factors, including available and accessible social support.  PLAN OF CARE: hospital admission, medication management, substance abuse  counseling, discharge planning.  Ms. Docter is a 61 year old female with a history of depression and alcoholism transferred from medical floor where she was briefly hospitalized for alcohol detox.  #Suicidal ideation -patient able to contract for safety in the hospital  #Depression -continue Effexor 150 mg daily  #Chronic pain -Continue Neurontin 900 mg TID  #Insomnia -Continue Trazodone 300 mg nightly  #Alcohol withdrawal -Librium 25 mg QID for 3 days -refuses residential treatment of pharmacotherapy for alcoholism -open to [facility] IOP -Compazine 10 mg PRN nausea  #GERD -Protonix 40 mg daily  # URI -Continue Z-pack  #Smoking -Nicotine patch is available  #Constipation -Colace 200 mg BID -Bisacodyl  #Falls -PT consult  #Disposition -discharged to home -follow up with [facility] for medication management and [facility]    I certify that inpatient services furnished can reasonably be expected to improve the patient's condition.   Orson Slick, MD 05/12/2017, 10:36 AM

## 2017-05-12 NOTE — Progress Notes (Signed)
Recreation Therapy Notes   Date: 11.20.18  Time: 9:30 am   Location: Craft Room  Behavioral response: Appropriate  Intervention Topic: Time Management  Discussion/Intervention: Group content today was focused on time management. The group defined time management and identified healthy ways to manage time. Individuals expressed how much of the 24 hours they use in a day. Patients expressed how much time they use just for themselves personally. The group expressed how they have managed their time in the past. Individuals participated in the intervention "Managing Life" where they had a chance to see how much of the 24 hours they use and where it goes. Clinical Observations/Feedback:  Patient came to group and defined time management as allocating appropriate time to be productive. She explained that she normally uses 13 hours of her day. Individual stated that when managing time you have to be flexible because anything can happen and mess up your plans. 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  Veronnica Hennings LRT/CTRS         Danyia Borunda 05/12/2017 11:31 AM

## 2017-05-12 NOTE — Evaluation (Signed)
Physical Therapy Evaluation Patient Details Name: [name] MRN: [id number] DOB: 12-15-1955 Today's Date: 05/12/2017   History of Present Illness  Pt is a 61 y/o F who was admitted to [place] [facility] from main [facility] due to depression and alcoholism.  Pt with low sodium upon initial admission and was treated for this on the floor before transferred to [facility].  Pt on CIWA protocol.  Pt's PMH includes cancer, insomnia, neuropathy, L arm amputation above elbow, lumbar laminectomy, Bil THA.    Clinical Impression  Pt admitted with above diagnosis. Pt currently with functional limitations due to the deficits listed below (see PT Problem List). New PT order placed as pt admitted to [place] and PT complete Evaluation this date.  Noted LLE weakness with formal MMT and functionally with partial L knee buckle x2 while ambulating.  Pt able to remain steady using R lofstrand crutch but relies on min guard assist and limited to ambulating 30 ft due to fatigue and BLE weakness.  She will not have consistent assist/supervision from her husband at d/c.  Given pt's current mobility status, recommending SNF at d/c. However, if the pt refuses SNF then recommend HHPT at d/c.  Pt will benefit from skilled PT to increase their independence and safety with mobility to allow discharge to the venue listed below.      Follow Up Recommendations SNF    Equipment Recommendations  Other (comment)(R loftstrand crutch)    Recommendations for Other Services       Precautions / Restrictions Precautions Precautions: Fall Restrictions Weight Bearing Restrictions: No      Mobility  Bed Mobility               General bed mobility comments: Pt sitting in WC at start and end of session  Transfers Overall transfer level: Needs assistance Equipment used: Lofstrands Transfers: Sit to/from Stand Sit to Stand: Min guard         General transfer comment: Close min guard and verbal  cues for proper technique for sit<>stand transfers with R loftstrand.    Ambulation/Gait Ambulation/Gait assistance: Min guard Ambulation Distance (Feet): 30 Feet Assistive device: Lofstrands Gait Pattern/deviations: Step-through pattern;Decreased stride length Gait velocity: decreased Gait velocity interpretation: Below normal speed for age/gender General Gait Details: Pt with decreased gait speed and partial R knee buckle x2 with reports of BLE fatigue.  Cues for proper sequencing using loftstrand.   Stairs            Wheelchair Mobility    Modified Rankin (Stroke Patients Only)       Balance Overall balance assessment: Needs assistance;History of Falls Sitting-balance support: No upper extremity supported;Feet supported Sitting balance-Leahy Scale: Good     Standing balance support: No upper extremity supported;During functional activity Standing balance-Leahy Scale: Fair Standing balance comment: Pt able to stand statically without UE support but relies on UE support for dynamic activities                             Pertinent Vitals/Pain Pain Assessment: No/denies pain    Home Living Family/patient expects to be discharged to:: Private residence Living Arrangements: Spouse/significant other Available Help at Discharge: Family;Available PRN/intermittently Type of Home: Apartment Home Access: Stairs to enter Entrance Stairs-Rails: None Entrance Stairs-Number of Steps: 3 Home Layout: One level Home Equipment: Cane - single point      Prior Function Level of Independence: Independent with assistive device(s)  Gait / Transfers Assistance Needed: Performed independently with reported falls in past twelve months, including one with injury, L hip dislocation.  Pt admits that she knows she needs to use cane but doesn't as she is unable to carry anything if she uses the cane.   ADL's / Homemaking Assistance Needed: Patient does not drive but does do  grocery shopping occasionally.        Hand Dominance        Extremity/Trunk Assessment   Upper Extremity Assessment Upper Extremity Assessment: LUE deficits/detail;RUE deficits/detail RUE Deficits / Details: RUE strength grossly 4/5 LUE Deficits / Details: above elbow amputation    Lower Extremity Assessment Lower Extremity Assessment: Generalized weakness;RLE deficits/detail;LLE deficits/detail RLE Deficits / Details: RLE strength grossly 4-/5; although question effort on pt's part at times LLE Deficits / Details: RLE strength grossly 3+/5, R knee extension 3-/5; although question effort on pt's part at times       Communication   Communication: No difficulties  Cognition Arousal/Alertness: Awake/alert Behavior During Therapy: WFL for tasks assessed/performed Overall Cognitive Status: Within Functional Limits for tasks assessed                                        General Comments General comments (skin integrity, edema, etc.): All PT equipment removed from room at end of session    Exercises General Exercises - Lower Extremity Long Arc Quad: Both;10 reps;Seated Hip ABduction/ADduction: Other (comment);20 reps;Strengthening;Both(isometric pillow squeezes) Other Exercises Other Exercises: Encouraged pt to continue using BLEs to self propel WC in hallway moving both forward and back to challenge different muscle groups.     Assessment/Plan    PT Assessment Patient needs continued PT services  PT Problem List Decreased strength;Decreased activity tolerance;Decreased balance;Decreased knowledge of use of DME;Decreased safety awareness;Pain       PT Treatment Interventions DME instruction;Gait training;Stair training;Functional mobility training;Therapeutic activities;Therapeutic exercise;Balance training;Neuromuscular re-education;Patient/family education;Wheelchair mobility training    PT Goals (Current goals can be found in the Care Plan section)   Acute Rehab PT Goals Patient Stated Goal: to get stronger and more independent PT Goal Formulation: With patient Time For Goal Achievement: 05/26/17 Potential to Achieve Goals: Good    Frequency Min 2X/week   Barriers to discharge Inaccessible home environment;Decreased caregiver support Steps to enter home and husband unable to provide 24/7 assist    Co-evaluation               AM-PAC PT "6 Clicks" Daily Activity  Outcome Measure Difficulty turning over in bed (including adjusting bedclothes, sheets and blankets)?: A Little Difficulty moving from lying on back to sitting on the side of the bed? : A Little Difficulty sitting down on and standing up from a chair with arms (e.g., wheelchair, bedside commode, etc,.)?: A Lot Help needed moving to and from a bed to chair (including a wheelchair)?: A Little Help needed walking in hospital room?: A Little Help needed climbing 3-5 steps with a railing? : A Little 6 Click Score: 17    End of Session Equipment Utilized During Treatment: Gait belt Activity Tolerance: Patient tolerated treatment well;Patient limited by fatigue Patient left: in chair;with call bell/phone within reach Nurse Communication: Mobility status PT Visit Diagnosis: Muscle weakness (generalized) (M62.81);Unsteadiness on feet (R26.81);Other abnormalities of gait and mobility (R26.89);History of falling (Z91.81)    Time: 0865-7846 PT Time Calculation (min) (ACUTE ONLY): 21 min   Charges:  PT Evaluation $PT Eval Low Complexity: 1 Low     PT G Codes:   PT G-Codes **NOT FOR INPATIENT CLASS** Functional Assessment Tool Used: AM-PAC 6 Clicks Basic Mobility;Clinical judgement Functional Limitation: Mobility: Walking and moving around Mobility: Walking and Moving Around Current Status (J1216): At least 40 percent but less than 60 percent impaired, limited or restricted Mobility: Walking and Moving Around Goal Status [phone]): At least 1 percent but less than 20  percent impaired, limited or restricted    Collie Siad PT, DPT 05/12/2017, 3:11 PM

## 2017-05-12 NOTE — Progress Notes (Signed)
Recreation Therapy Notes  INPATIENT RECREATION THERAPY ASSESSMENT  Patient Details Name: ATIYANA WELTE MRN: [id number] DOB: 1955/09/20 Today's Date: 05/12/2017  Patient Stressors: Relationship, Other (Comment)(Pain, Finances, Substance Use)  Coping Skills:   Substance Abuse, Talking, Other (Comment)(Pray, Breathing)  Personal Challenges: Communication, Concentration, Decision-Making, Expressing Yourself, Relationships, Self-Esteem/Confidence, Stress Management, Substance Abuse  Leisure Interests (2+):  Individual - Reading, Psychiatric nurse, Social - Friends, Sports - Charity fundraiser, Music - Listen  Awareness of Community Resources:  Yes  Community Resources:  Church, Other (Comment)([facility])  Current Use: Yes  If no, Barriers?:    Patient Strengths:  Good listener, Loyal person  Patient Identified Areas of Improvement:  My co-dependence  Current Recreation Participation:  Dancing and reading  Patient Goal for Hospitalization:   Get sober, clear my head and find some coping skills.  City of Residence:  [place] of Residence:  [place]    Current SI (including self-harm):  No  Current HI:  No  Consent to Intern Participation: N/A   Vicci Reder 05/12/2017, 12:44 PM
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
## 2017-05-12 NOTE — Progress Notes (Signed)
Chaplain responded to a consult for pt in [facility] Rm 324 who wanted to see [facility]. [place] met with pt in the consult Rm. Pt talked about her struggles with withdraws from alcohol, pt talked about her husband who is struggling with alcohol and whom she stated had gotten clean for a week. Pt was calm and reflective. She regreted that her daughter was not pleased with her actions. Pt said she wants to have long term help to deal with alcohol withdraw. [place] listen to pt, validated her and provided prayer support as pt requested. [place] to follow up pt as needed.   05/12/17 1600  Clinical Encounter Type  Visited With Patient;Health care provider  Visit Type Initial;Spiritual support  Referral From Nurse  Consult/Referral To Chaplain  Spiritual Encounters  Spiritual Needs Prayer;Other (Comment)

## 2017-05-13 MED ORDER — TUBERCULIN PPD 5 UNIT/0.1ML ID SOLN
5.0000 [IU] | Freq: Once | INTRADERMAL | Status: AC
  Administered 2017-05-13: 5 [IU] via INTRADERMAL
  Filled 2017-05-13: qty 0.1

## 2017-05-13 NOTE — Tx Team (Addendum)
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 acetaminophen (TYLENOL) tablet 650 mg  650 mg Oral Q6H PRN Clapacs, [name], MD   650 mg at 05/12/17 0453  . alum & mag hydroxide-simeth (MAALOX/MYLANTA) 200-200-20 MG/5ML suspension 30 mL  30 mL Oral Q4H PRN Clapacs, [name], MD      . azithromycin (ZITHROMAX) tablet 250 mg  250 mg Oral Daily Clapacs, [name], MD   250 mg at 05/13/17 0826  . bisacodyl (DULCOLAX) EC tablet 5 mg  5 mg Oral Daily PRN Pucilowska, [name], MD      . chlordiazePOXIDE (LIBRIUM) capsule 25 mg  25 mg Oral QID Pucilowska, [name], MD   25 mg at 05/13/17 1204  . docusate sodium (COLACE) capsule 200 mg  200 mg Oral BID Pucilowska, [name], MD   200 mg at 05/13/17 0825  . folic acid (FOLVITE) tablet 1 mg  1 mg Oral Daily Clapacs, [name], MD   1 mg at 05/13/17 0825  . gabapentin (NEURONTIN) capsule 900 mg  900 mg Oral TID Clapacs, [name], MD   900 mg at 05/13/17 1204  . magnesium hydroxide (MILK OF MAGNESIA) suspension 30 mL  30 mL Oral Daily PRN Clapacs, [name], MD   30 mL at 05/13/17 0825  . multivitamin with minerals tablet 1 tablet  1 tablet Oral Daily Clapacs, [name], MD   1 tablet at 05/13/17 0825  . nicotine (NICODERM CQ - dosed in mg/24 hours) patch 21 mg  21 mg Transdermal Daily McNew, [name], MD   21 mg at 05/13/17 0828  . ondansetron (ZOFRAN) tablet 4 mg  4 mg Oral Q8H PRN Pucilowska, Jolanta  B, MD   4 mg at 05/13/17 0612  . pantoprazole (PROTONIX) EC tablet 40 mg  40 mg Oral Daily Pucilowska, [name], MD   40 mg at 05/13/17 0825  . phenol-menthol (CEPASTAT) lozenge 1 lozenge  1 lozenge Buccal QID PRN Clapacs, [name], MD      . prochlorperazine (COMPAZINE) tablet 10 mg  10 mg Oral Q6H PRN Pucilowska, [name], MD   10 mg at 05/12/17 1341  . propranolol (INDERAL) tablet 10 mg  10 mg Oral TID Clapacs, [name], MD   10 mg at 05/13/17 1204  . senna-docusate (Senokot-S) tablet 1 tablet  1 tablet Oral BID Clapacs, [name], MD   1 tablet at 05/13/17 [phone]  . thiamine (VITAMIN Gray-1) tablet 100 mg  100 mg Oral Daily Clapacs, [name], MD   100 mg at 05/13/17 0825   Or  . thiamine (Gray-1) injection 100 mg  100 mg Intravenous Daily Clapacs, [name], MD      . traZODone (DESYREL) tablet 300 mg  300 mg Oral QHS Clapacs, [name], MD   300 mg at 05/12/17 2143  . venlafaxine XR (EFFEXOR-XR) 24 hr capsule 150 mg  150 mg Oral Q breakfast Clapacs, [name], MD   150 mg at 05/13/17 0825   PTA  Medications: Medications Prior to Admission  Medication Sig Dispense Refill Last Dose  . azithromycin (ZITHROMAX) 250 MG tablet Take 2 tablets p.o. x1 today followed by 1 tablet once daily for 4 days starting from tomorrow 6 each 0   . folic acid (FOLVITE) 1 MG tablet Take 1 tablet (1 mg total) daily by mouth.     . gabapentin (NEURONTIN) 300 MG capsule Take 3 capsules (900 mg total) by mouth 3 (three) times daily. 270 capsule 0 05/08/2017 at 0800  . LORazepam (ATIVAN) 2 MG tablet Take 1 tablet (2 mg total) every 4 (four) hours as needed by mouth (CIWA-AR > 8  -OR-  withdrawal symptoms:  anxiety, agitation, insomnia, diaphoresis, nausea, vomiting, tremors, tachycardia, or hypertension.). 30 tablet 0   . Multiple Vitamin (MULTIVITAMIN WITH MINERALS) TABS tablet Take 1 tablet daily by mouth.     . phenol-menthol (CEPASTAT) 14.5 MG lozenge Place 1 lozenge 4 (four) times daily as needed inside cheek for sore throat. 100 tablet 0    . propranolol (INDERAL) 10 MG tablet Take 1 tablet (10 mg total) by mouth 3 (three) times daily. 90 tablet 0 05/08/2017 at 0700  . senna-docusate (SENOKOT-S) 8.6-50 MG tablet Take 1 tablet 2 (two) times daily as needed by mouth for mild constipation.     . thiamine 100 MG tablet Take 1 tablet (100 mg total) daily by mouth.     . traZODone (DESYREL) 100 MG tablet Take 2 tablets (200 mg total) by mouth at bedtime. (Patient taking differently: Take 300 mg at bedtime by mouth. ) 60 tablet 0 05/07/2017 at 2000  . venlafaxine XR (EFFEXOR-XR) 150 MG 24 hr capsule Take 1 capsule (150 mg total) by mouth daily with breakfast. 30 capsule 0 05/08/2017 at 0700    Patient Stressors:    Patient Strengths:    Treatment Modalities: Medication Management, Group therapy, Case management,  1 to 1 session with clinician, Psychoeducation, Recreational therapy.   Physician Treatment Plan for Primary Diagnosis: Severe recurrent major depression without psychotic features ([place]) Long Term Goal(s): Improvement in symptoms so as ready for discharge Improvement in symptoms so as ready for discharge   Short Term Goals: Ability to identify changes in lifestyle to reduce recurrence of condition will improve Ability to verbalize feelings will improve Ability to disclose and discuss suicidal ideas Ability to demonstrate self-control will improve Ability to identify and develop effective coping behaviors will improve Ability to identify triggers associated with substance abuse/mental health issues will improve Ability to identify changes in lifestyle to reduce recurrence of condition will improve Ability to demonstrate self-control will improve Ability to identify triggers associated with substance abuse/mental health issues will improve  Medication Management: Evaluate patient's response, side effects, and tolerance of medication regimen.  Therapeutic Interventions: 1 to 1 sessions, Unit Group sessions and  Medication administration.  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)   Alcohol use disorder, severe, dependence ([place])   Tobacco use disorder  Long Term Goal(s): Improvement in symptoms so as ready for discharge Improvement in symptoms so as ready for discharge   Short Term Goals: Ability to identify changes in lifestyle to reduce recurrence of condition will improve Ability to verbalize feelings will improve Ability to disclose and discuss suicidal ideas Ability to demonstrate self-control will improve Ability to identify and  develop effective coping behaviors will improve Ability to identify triggers associated with substance abuse/mental health issues will improve Ability to identify changes in lifestyle to reduce recurrence of condition will improve Ability to demonstrate self-control will improve Ability to identify triggers associated with substance abuse/mental health issues will improve     Medication Management: Evaluate patient's response, side effects, and tolerance of medication regimen.  Therapeutic Interventions: 1 to 1 sessions, Unit Group sessions and Medication administration.  Evaluation of Outcomes: Progressing   RN Treatment Plan for Primary Diagnosis: Severe recurrent major depression without psychotic features ([place]) Long Term Goal(s): Knowledge of disease and therapeutic regimen to maintain health will improve  Short Term Goals: Ability to participate in decision making will improve, Ability to identify and develop effective coping behaviors will improve and Compliance with prescribed medications will improve  Medication Management: RN will administer medications as ordered by provider, will assess and evaluate patient's response  and provide education to patient for prescribed medication. RN will report any adverse and/or side effects to prescribing provider.  Therapeutic Interventions: 1 on 1 counseling sessions, Psychoeducation, Medication administration, Evaluate responses to treatment, Monitor vital signs and CBGs as ordered, Perform/monitor CIWA, COWS, AIMS and Fall Risk screenings as ordered, Perform wound care treatments as ordered.  Evaluation of Outcomes: Progressing   LCSW Treatment Plan for Primary Diagnosis: Severe recurrent major depression without psychotic features ([place]) Long Term Goal(s): Safe transition to appropriate next level of care at discharge, Engage patient in therapeutic group addressing interpersonal concerns.  Short Term Goals: Engage patient in aftercare planning with referrals and resources, Increase social support and Increase skills for wellness and recovery  Therapeutic Interventions: Assess for all discharge needs, 1 to 1 time with Social worker, Explore available resources and support systems, Assess for adequacy in community support network, Educate family and significant other(s) on suicide prevention, Complete Psychosocial Assessment, Interpersonal group therapy.  Evaluation of Outcomes: Progressing   Progress in Treatment: Attending groups: Yes. Participating in groups: Yes. Taking medication as prescribed: Yes. Toleration medication: Yes. Family/Significant other contact made: Yes, individual(s) contacted:  husband Patient understands diagnosis: Yes. Discussing patient identified problems/goals with staff: Yes. Medical problems stabilized or resolved: Yes. Denies suicidal/homicidal ideation: Yes. Issues/concerns per patient self-inventory: No. Other: none  New problem(s) identified: No, Describe:  none  New Short Term/Long Term Goal(s):Pt goal: to get well and refocus on her recovery.  Discharge Plan or Barriers: Pt will continue outpt follow up with [facility].  Reason for  Continuation of Hospitalization: Depression Medication stabilization  Estimated Length of Stay: 3-5 days. Recreational Therapy: Patient Stressors: Relationship, Other (Comment)(Pain, Finances, Substance Use) Patient Goal: Patient will identify 3 positive coping strategies to use post discharge x5 days.    Attendees: Patient: Harlow Basley 05/13/2017   Physician: Dr. Bary Leriche, MD 05/13/2017   Nursing: Polly Cobia, RN 05/13/2017   RN Care Manager: 05/13/2017   Social Worker: Lurline Idol, LCSW 05/13/2017   Recreational Therapist: Roanna Epley, LRT/CTRS 05/13/2017  Other:  05/13/2017   Other:  05/13/2017   Other: 05/13/2017     Scribe for Treatment Team: Joanne Chars, [place] 05/13/2017 12:53 PM

## 2017-05-13 NOTE — Progress Notes (Signed)
Recreation Therapy Notes   Date: 11.21.18  Time: 1:00pm  Location: Craft Room  Behavioral response: Appropriate  Intervention Topic: Leisure  Discussion/Intervention: Group content today was focused on leisure. The group defined what leisure is and some positive leisure activities they participate in. Individuals identified the difference between good and bad leisure. Participants expressed how they feel after participating in the leisure of their choice. The group discussed how they go about picking a leisure activity and if others are involved in their leisure activities. The patient stated how many leisure activities they too choose from and reasons why it is important to have leisure time. Individuals participated in the intervention "Leisure Jeopardy" where they had a chance to identify new leisure activities as well as benefits of leisure. Clinical Observations/Feedback:  Patient came to group and defined leisure as doing activities you enjoy as well as using coping skills. She stated that after participating in leisure activities she feels relaxed and refocused. Individual expressed she normally does some of her hobbies or reads when she participates in leisure. Patient participated in the intervention during group and was social with peers and staff. Haiden Rawlinson LRT/CTRS         Rielynn Trulson 05/13/2017 2:12 PM

## 2017-05-13 NOTE — Plan of Care (Signed)
  Safety: Ability to remain free from injury will improve 05/13/2017 0541 - Progressing by Harl Bowie, RN   Education: Knowledge of disease or condition will improve 05/13/2017 0541 - Progressing by Harl Bowie, RN

## 2017-05-13 NOTE — [facility] Group Notes (Signed)
  [facility] LCSW Group Therapy Note  Date/Time: 05/13/17, 0930  Type of Therapy/Topic:  Group Therapy:  Emotion Regulation  Participation Level:  Active   Mood:pleasant  Description of Group:    The purpose of this group is to assist patients in learning to regulate negative emotions and experience positive emotions. Patients will be guided to discuss ways in which they have been vulnerable to their negative emotions. These vulnerabilities will be juxtaposed with experiences of positive emotions or situations, and patients challenged to use positive emotions to combat negative ones. Special emphasis will be placed on coping with negative emotions in conflict situations, and patients will process healthy conflict resolution skills.  Therapeutic Goals: 1. Patient will identify two positive emotions or experiences to reflect on in order to balance out negative emotions:  2. Patient will label two or more emotions that they find the most difficult to experience:  3. Patient will be able to demonstrate positive conflict resolution skills through discussion or role plays:   Summary of Patient Progress:Pt came in late but was active in group discussion.  Pt shared several ways that she has used to deal with emotions, such as prayer, and was active participant in group discussion.       Therapeutic Modalities:   Cognitive Behavioral Therapy Feelings Identification Dialectical Behavioral Therapy  Lurline Idol, LCSW

## 2017-05-13 NOTE — Progress Notes (Signed)
Recreation Therapy Notes   Date: 11.21.2018  Time: 3:00pm  Location: Craft room  Behavioral response: Appropriate  Group Type: Craft  Participation level: Active  Communication: Patient was social with peers and staff.  Comments: N/A  Marsheila Alejo LRT/CTRS        Weslynn Ke 05/13/2017 4:23 PM

## 2017-05-13 NOTE — Plan of Care (Signed)
Patient pleasant and cooperative. Complains of nausea. Zofran given with good relief. Denies SI, HI, AVH. Reports feeling better, complains of being weak, uses wheelchair for ambulation. Pt medication and group compliant. Appropriate with staff and peers. Encouragement and support offered. Safety checks maintained. Pt receptive and remains safe on unit with q 15 min checks.

## 2017-05-13 NOTE — Progress Notes (Signed)
[facility] MD Progress Note  05/13/2017 2:58 PM [name]  MRN:  [id number]  Subjective:  [name] is a 61 year old female with amputated left arm and a history of alcoholism and mood instability admitted for worsening depression and alcohol detox.  [name] met with treatment team this morning. Complins of severe depression and tremor from withdrawal. Slept better last night. No side effects from medications. She still feels unsteady and uses a wheel chair.  Treatment plan. We will continue her outpatient regimen of Effexor, Neurontin and Trazodone. Continue Librium for 3 days. Vital signs are stable.  Social/disposition. She will return home. Follow up with [facility].  Principal Problem: Severe recurrent major depression without psychotic features ([place]) Diagnosis:   Patient Active Problem List   Diagnosis Date Noted  . High anion gap metabolic acidosis [T70.1] 05/08/2017  . Overdose of antidepressant [T43.201A] 07/03/2016  . 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 Severe recurrent major depression without psychotic features ([place]) [F33.2] 07/03/2015  . Alcohol use disorder, severe, dependence ([place]) [F10.20] 01/26/2015  . Tobacco use disorder [F17.200] 01/26/2015  . Alcohol withdrawal ([place]) [F10.239] 01/08/2015  . 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 GAD (generalized anxiety disorder) [F41.1] 12/24/2013  . PTSD (post-traumatic stress disorder) [F43.10] 12/22/2013   Total Time spent with patient: 20 minutes  Past Psychiatric History: alcoholism.  Past Medical History:  Past Medical History:  Diagnosis Date  . Anxiety   . Anxiety    panic anxiety disorder  . Arthritis   . Asthma    chronic asthmatic bronchitis  . Cancer [name])    Desmoid tumor left forearm  . Depression   . ETOH abuse   . Heart murmur   . Insomnia   . Neuropathy     Past Surgical History:  Procedure Laterality Date  . ABDOMINAL HYSTERECTOMY    .  ABDOMINAL HYSTERECTOMY  2007  . 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 ARM AMPUTATION AT SHOULDER Left   . CESAREAN SECTION    . [place]   x 2  . JOINT REPLACEMENT Right 2012   hip  . LUMBAR LAMINECTOMY/DECOMPRESSION MICRODISCECTOMY Left 04/16/2016   Procedure: LUMBAR LAMINECTOMY/DECOMPRESSION MICRODISCECTOMY 1 LEVEL;  Surgeon: Meade Maw, MD;  Location: [facility] ORS;  Service: Neurosurgery;  Laterality: Left;  Left L4-5 far lateral discectomy, left L4-5 laminoforaminotomy  . TOTAL HIP ARTHROPLASTY Left 09/17/2015   Procedure: TOTAL HIP ARTHROPLASTY;  Surgeon: Dereck Leep, MD;  Location: [facility] ORS;  Service: Orthopedics;  Laterality: Left;   Family History:  Family History  Problem Relation Age of Onset  . COPD Mother    Family Psychiatric  History: depression. Social History:  Social History   Substance and Sexual Activity  Alcohol Use Yes   Comment: 2 40oz beers daily     Social History   Substance and Sexual Activity  Drug Use No   Comment: Patient denies     Social History   Socioeconomic History  . Marital status: Divorced    Spouse name: None  . Number of children: None  . Years of education: None  . Highest education level: None  Social Needs  . Financial resource strain: None  . Food insecurity - worry: None  . Food insecurity - inability: None  . Transportation needs - medical: None  . Transportation needs - non-medical: None  Occupational History  . None  Tobacco Use  .  Smoking status: Current Every Day Smoker    Packs/day: 0.25    Types: Cigarettes  . Smokeless tobacco: Never Used  Substance and Sexual Activity  . Alcohol use: Yes    Comment: 2 40oz beers daily  . Drug use: No    Comment: Patient denies   . Sexual activity: Yes  Other Topics Concern  . None  Social History Narrative   ** Merged History Encounter **       Additional Social History:    History of alcohol / drug use?: Yes                     Sleep: Fair  Appetite:  Fair  Current Medications: Current Facility-Administered Medications  Medication Dose Route Frequency Provider Last Rate Last Dose  . acetaminophen (TYLENOL) tablet 650 mg  650 mg Oral Q6H PRN Clapacs, [name], MD   650 mg at 05/12/17 0453  . alum & mag hydroxide-simeth (MAALOX/MYLANTA) 200-200-20 MG/5ML suspension 30 mL  30 mL Oral Q4H PRN Clapacs, [name], MD      . azithromycin (ZITHROMAX) tablet 250 mg  250 mg Oral Daily Clapacs, [name], MD   250 mg at 05/13/17 0826  . bisacodyl (DULCOLAX) EC tablet 5 mg  5 mg Oral Daily PRN Khyre Germond B, MD      . chlordiazePOXIDE (LIBRIUM) capsule 25 mg  25 mg Oral QID Jalaina Salyers B, MD   25 mg at 05/13/17 1204  . docusate sodium (COLACE) capsule 200 mg  200 mg Oral BID Trecia Maring B, MD   200 mg at 05/13/17 0825  . folic acid (FOLVITE) tablet 1 mg  1 mg Oral Daily Clapacs, [name], MD   1 mg at 05/13/17 0825  . gabapentin (NEURONTIN) capsule 900 mg  900 mg Oral TID Clapacs, [name], MD   900 mg at 05/13/17 1204  . magnesium hydroxide (MILK OF MAGNESIA) suspension 30 mL  30 mL Oral Daily PRN Clapacs, [name], MD   30 mL at 05/13/17 0825  . multivitamin with minerals tablet 1 tablet  1 tablet Oral Daily Clapacs, [name], MD   1 tablet at 05/13/17 0825  . nicotine (NICODERM CQ - dosed in mg/24 hours) patch 21 mg  21 mg Transdermal Daily McNew, [name], MD   21 mg at 05/13/17 0828  . ondansetron (ZOFRAN) tablet 4 mg  4 mg Oral Q8H PRN Tyrina Hines B, MD   4 mg at 05/13/17 1450  . pantoprazole (PROTONIX) EC tablet 40 mg  40 mg Oral Daily Jackqueline Aquilar B, MD   40 mg at 05/13/17 0825  . phenol-menthol (CEPASTAT) lozenge 1 lozenge  1 lozenge Buccal QID PRN Clapacs, [name], MD      . prochlorperazine (COMPAZINE) tablet 10 mg  10 mg Oral Q6H PRN Brennah Quraishi B, MD   10 mg at 05/12/17 1341  . propranolol (INDERAL) tablet 10 mg  10 mg Oral TID Clapacs, [name], MD   10 mg at  05/13/17 1204  . senna-docusate (Senokot-S) tablet 1 tablet  1 tablet Oral BID Clapacs, [name], MD   1 tablet at 05/13/17 [phone]  . thiamine (VITAMIN B-1) tablet 100 mg  100 mg Oral Daily Clapacs, [name], MD   100 mg at 05/13/17 0825   Or  . thiamine (B-1) injection 100 mg  100 mg Intravenous Daily Clapacs, [name], MD      . traZODone (DESYREL) tablet 300 mg  300 mg Oral QHS Clapacs, [name], MD   300 mg at 05/12/17 2143  . venlafaxine XR (EFFEXOR-XR) 24 hr capsule 150 mg  150 mg Oral Q breakfast Clapacs, [name], MD   150 mg at 05/13/17 0825    Lab Results:  Results for orders placed or performed during the hospital encounter of 05/11/17 (from the past 48 hour(s))  Hemoglobin A1c     Status: None   Collection Time: 05/12/17  7:06 AM  Result Value Ref Range   Hgb A1c MFr Bld 5.0 4.8 - 5.6 %    Comment: (NOTE) Pre diabetes:          5.7%-6.4% Diabetes:              >6.4% Glycemic control for   <7.0% adults with diabetes    Mean Plasma Glucose 96.8 mg/dL    Comment: Performed at [facility], [place] [address] [place], [place] [address]  Lipid panel     Status: Abnormal   Collection Time: 05/12/17  7:06 AM  Result Value Ref Range   Cholesterol 225 (H) 0 - 200 mg/dL   Triglycerides 185 (H) <150 mg/dL   HDL 58 >40 mg/dL   Total CHOL/HDL Ratio 3.9 RATIO   VLDL 37 0 - 40 mg/dL   LDL Cholesterol 130 (H) 0 - 99 mg/dL    Comment:        Total Cholesterol/HDL:CHD Risk Coronary Heart Disease Risk Table                     Men   Women  1/2 Average Risk   3.4   3.3  Average Risk       5.0   4.4  2 X Average Risk   9.6   7.1  3 X Average Risk  23.4   11.0        Use the calculated Patient Ratio above and the CHD Risk Table to determine the patient's CHD Risk.        ATP III CLASSIFICATION (LDL):  <100     mg/dL   Optimal  100-129  mg/dL   Near or Above                    Optimal  130-159  mg/dL   Borderline  160-189  mg/dL   High  >190     mg/dL   Very High   TSH     Status:  None   Collection Time: 05/12/17  7:06 AM  Result Value Ref Range   TSH 1.672 0.350 - 4.500 uIU/mL    Comment: Performed by a 3rd Generation assay with a functional sensitivity of <=0.01 uIU/mL.    Blood Alcohol level:  Lab Results  Component Value Date   ETH <10 05/10/2017   ETH 189 (H) 67/20/9470    Metabolic Disorder Labs: Lab Results  Component Value Date   HGBA1C 5.0 05/12/2017   MPG 96.8 05/12/2017   MPG 97 07/05/2016   No results found for: PROLACTIN Lab Results  Component Value Date   CHOL 225 (H) 05/12/2017   TRIG 185 (H) 05/12/2017   HDL 58 05/12/2017   CHOLHDL 3.9 05/12/2017   VLDL 37 05/12/2017   LDLCALC 130 (H) 05/12/2017   LDLCALC 136 (H) 07/05/2016    Physical Findings: AIMS: Facial and Oral Movements Muscles of Facial Expression: None, normal Lips and Perioral Area: None, normal Jaw: None, normal Tongue: None, normal,Extremity Movements Upper (  arms, wrists, hands, fingers): None, normal Lower (legs, knees, ankles, toes): None, normal, Trunk Movements Neck, shoulders, hips: None, normal, Overall Severity Severity of abnormal movements (highest score from questions above): None, normal Incapacitation due to abnormal movements: None, normal Patient's awareness of abnormal movements (rate only patient's report): No Awareness, Dental Status Current problems with teeth and/or dentures?: No Does patient usually wear dentures?: No  CIWA:  CIWA-Ar Total: 1 COWS:  COWS Total Score: 3  Musculoskeletal: Strength & Muscle Tone: within normal limits and decreased Gait & Station: unsteady Patient leans: N/A  Psychiatric Specialty Exam: Physical Exam  Nursing note and vitals reviewed. Psychiatric: Her speech is normal. Thought content normal. Her affect is blunt. She is slowed and withdrawn. Cognition and memory are normal. She expresses impulsivity. She exhibits a depressed mood.    Review of Systems  Neurological: Positive for tremors.   Psychiatric/Behavioral: Positive for depression and substance abuse.  All other systems reviewed and are negative.   Blood pressure (!) 119/45, pulse 78, temperature 98.9 F (37.2 C), temperature source Oral, resp. rate 18, height '5\' 5"'$  (1.651 m), weight 64.4 kg (142 lb), SpO2 98 %.Body mass index is 23.63 kg/m.  General Appearance: Casual  Eye Contact:  Good  Speech:  Clear and Coherent  Volume:  Normal  Mood:  Anxious and Depressed  Affect:  Blunt  Thought Process:  Goal Directed and Descriptions of Associations: Intact  Orientation:  Full (Time, Place, and Person)  Thought Content:  WDL  Suicidal Thoughts:  Yes.  without intent/plan  Homicidal Thoughts:  No  Memory:  Immediate;   Fair Recent;   Fair Remote;   Fair  Judgement:  Poor  Insight:  Lacking  Psychomotor Activity:  Psychomotor Retardation  Concentration:  Concentration: Fair and Attention Span: Fair  Recall:  [employer] of Knowledge:  Fair  Language:  Fair  Akathisia:  No  Handed:  Right  AIMS (if indicated):     Assets:  Communication Skills Desire for Improvement Financial Resources/Insurance Housing Resilience Social Support  ADL's:  Intact  Cognition:  WNL  Sleep:  Number of Hours: 6.15     Treatment Plan Summary: Daily contact with patient to assess and evaluate symptoms and progress in treatment and Medication management   [name] is a 61 year old female with a history of depression and alcoholism transferred from medical floor where she was briefly hospitalized for alcohol detox.  #Suicidal ideation -patient able to contract for safety in the hospital  #Depression -continue Effexor 150 mg daily  #Chronic pain -Continue Neurontin 900 mg TID  #Insomnia -Continue Trazodone 300 mg nightly  #Alcohol withdrawal -Librium 25 mg QID for 3 days -refuses residential treatment of pharmacotherapy for alcoholism -open to [facility] IOP -Compazine 10 mg PRN nausea  #GERD -Protonix 40 mg  daily  # URI -Continue Z-pack  #Smoking -Nicotine patch is available  #Constipation -Colace 200 mg BID -Bisacodyl  #Falls -PT consult is appreciated -SNF recommended after discharge  #Disposition -discharge to SNF or home with home health -follow up with [facility] for medication management and Lasandra Beech, MD 05/13/2017, 2:58 PM

## 2017-05-13 NOTE — Progress Notes (Signed)
D: Patient is alert and oriented x 4, denies SI/HI/AVH, mood is pleasant and cooperative, thoughts are organized no withdrawal S/S from alcohol. Pt appears less anxious and she is interacting with peers and staff appropriately.  A: Pt was offered support and encouragement, and encouraged to attend groups. 15 minute checks were done for safety.  R:Pt attends groups and interacts well with peers and staff, complaint with medication, safety maintained on unit.

## 2017-05-14 DIAGNOSIS — F332 Major depressive disorder, recurrent severe without psychotic features: Secondary | ICD-10-CM

## 2017-05-14 MED ORDER — MAGNESIUM CITRATE PO SOLN
1.0000 | Freq: Once | ORAL | Status: AC
Start: 2017-05-14 — End: 2017-05-14
  Administered 2017-05-14: 1 via ORAL
  Filled 2017-05-14: qty 296

## 2017-05-14 MED ORDER — ONDANSETRON 4 MG PO TBDP
4.0000 mg | ORAL_TABLET | Freq: Three times a day (TID) | ORAL | Status: DC | PRN
  Administered 2017-05-14 – 2017-05-17 (×4): 4 mg via ORAL
  Filled 2017-05-14 (×4): qty 1

## 2017-05-14 MED ORDER — BISACODYL 10 MG RE SUPP
10.0000 mg | Freq: Every day | RECTAL | Status: DC | PRN
  Filled 2017-05-14: qty 1

## 2017-05-14 MED ORDER — CHLORDIAZEPOXIDE HCL 25 MG PO CAPS
25.0000 mg | ORAL_CAPSULE | Freq: Once | ORAL | Status: AC
  Administered 2017-05-14: 25 mg via ORAL
  Filled 2017-05-14: qty 1

## 2017-05-14 MED ORDER — MAGNESIUM HYDROXIDE 400 MG/5ML PO SUSP
960.0000 mL | Freq: Once | ORAL | Status: DC
  Filled 2017-05-14: qty 473

## 2017-05-14 NOTE — Progress Notes (Addendum)
[facility] MD Progress Note  05/14/2017 2:49 PM [name]  MRN:  [id number]  Subjective:  [name] is a 61 year old female with amputated left arm and a history of alcoholism and mood instability admitted for worsening depression and alcohol detox.  And endorses that she continues to be sad, have low energy and feel hopeless about the future.  However she feels that she is in brighter spirits today because it is Thanksgiving.  Feels thankful for the fact that she is in a safe place despite all the opacities in life.  That has been inconsistent touch with the husband.  Says he has been trying to balance drinking a little bit less is not too much noted to avoid troublesome withdrawal symptoms.  The plan is that he will go in for treatment once she gets home.  They have 2 kittens that need taking care of and hence both of them cannot seek treatment at the same time.  Says if he does not go in for treatment then she would not hesitate to exit the marriage.  Is motivated to quit alcohol, says she was sober for many years and relapsed after her husband started drinking following the loss of his job.  Feels that her medications are doing okay.  Says she feels a little better compared to the time when she came into the hospital.  Treatment plan. We will continue her outpatient regimen of Effexor, Neurontin and Trazodone. Continue Librium for 3 days. Vital signs are stable.  Social/disposition. She will return home. Follow up with [facility].  Principal Problem: Severe recurrent major depression without psychotic features ([place]) Diagnosis:   Patient Active Problem List   Diagnosis Date Noted  . High anion gap metabolic acidosis [N46.2] 05/08/2017  . Overdose of antidepressant [T43.201A] 07/03/2016  . 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 Severe recurrent major depression without psychotic features ([place]) [F33.2] 07/03/2015  . Alcohol use disorder, severe, dependence ([place]) [F10.20] 01/26/2015  .  Tobacco use disorder [F17.200] 01/26/2015  . Alcohol withdrawal ([place]) [F10.239] 01/08/2015  . 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 GAD (generalized anxiety disorder) [F41.1] 12/24/2013  . PTSD (post-traumatic stress disorder) [F43.10] 12/22/2013   Total Time spent with patient: 30 minutes  Past Psychiatric History: alcoholism.  Past Medical History:  Past Medical History:  Diagnosis Date  . Anxiety   . Anxiety    panic anxiety disorder  . Arthritis   . Asthma    chronic asthmatic bronchitis  . Cancer [facility])    Desmoid tumor left forearm  . Depression   . ETOH abuse   . Heart murmur   . Insomnia   . Neuropathy     Past Surgical History:  Procedure Laterality Date  . ABDOMINAL HYSTERECTOMY    . ABDOMINAL HYSTERECTOMY  2007  . 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 ARM AMPUTATION AT SHOULDER Left   . CESAREAN SECTION    . [place]   x 2  . JOINT REPLACEMENT Right 2012   hip  . LUMBAR LAMINECTOMY/DECOMPRESSION MICRODISCECTOMY Left 04/16/2016   Procedure: LUMBAR LAMINECTOMY/DECOMPRESSION MICRODISCECTOMY 1 LEVEL;  Surgeon: Meade Maw, MD;  Location: [facility] ORS;  Service: Neurosurgery;  Laterality: Left;  Left L4-5 far lateral discectomy, left L4-5 laminoforaminotomy  . TOTAL HIP ARTHROPLASTY Left 09/17/2015   Procedure: TOTAL HIP ARTHROPLASTY;  Surgeon: Dereck Leep, MD;  Location: [facility] ORS;  Service: Orthopedics;  Laterality:  Left;   Family History:  Family History  Problem Relation Age of Onset  . COPD Mother    Family Psychiatric  History: depression. Social History:  Social History   Substance and Sexual Activity  Alcohol Use Yes   Comment: 2 40oz beers daily     Social History   Substance and Sexual Activity  Drug Use No   Comment: Patient denies     Social History   Socioeconomic History  . Marital status: Divorced    Spouse name: None  . Number of children: None   . Years of education: None  . Highest education level: None  Social Needs  . Financial resource strain: None  . Food insecurity - worry: None  . Food insecurity - inability: None  . Transportation needs - medical: None  . Transportation needs - non-medical: None  Occupational History  . None  Tobacco Use  . Smoking status: Current Every Day Smoker    Packs/day: 0.25    Types: Cigarettes  . Smokeless tobacco: Never Used  Substance and Sexual Activity  . Alcohol use: Yes    Comment: 2 40oz beers daily  . Drug use: No    Comment: Patient denies   . Sexual activity: Yes  Other Topics Concern  . None  Social History Narrative   ** Merged History Encounter **       Additional Social History:    History of alcohol / drug use?: Yes                    Sleep: Fair  Appetite:  Fair  Current Medications: Current Facility-Administered Medications  Medication Dose Route Frequency Provider Last Rate Last Dose  . acetaminophen (TYLENOL) tablet 650 mg  650 mg Oral Q6H PRN Clapacs, [name], MD   650 mg at 05/12/17 0453  . alum & mag hydroxide-simeth (MAALOX/MYLANTA) 200-200-20 MG/5ML suspension 30 mL  30 mL Oral Q4H PRN Clapacs, [name], MD      . azithromycin (ZITHROMAX) tablet 250 mg  250 mg Oral Daily Clapacs, [name], MD   250 mg at 05/14/17 2836  . bisacodyl (DULCOLAX) EC tablet 5 mg  5 mg Oral Daily PRN Pucilowska, [name], MD      . chlordiazePOXIDE (LIBRIUM) capsule 25 mg  25 mg Oral QID Pucilowska, [name], MD   25 mg at 05/14/17 1207  . docusate sodium (COLACE) capsule 200 mg  200 mg Oral BID Pucilowska, [name], MD   200 mg at 05/14/17 0838  . folic acid (FOLVITE) tablet 1 mg  1 mg Oral Daily Clapacs, [name], MD   1 mg at 05/14/17 [phone]  . gabapentin (NEURONTIN) capsule 900 mg  900 mg Oral TID Clapacs, [name], MD   900 mg at 05/14/17 1207  . magnesium citrate solution 1 Bottle  1 Bottle Oral Once Ramond Dial, MD      . magnesium hydroxide (MILK OF MAGNESIA)  suspension 30 mL  30 mL Oral Daily PRN Clapacs, [name], MD   30 mL at 05/13/17 0825  . multivitamin with minerals tablet 1 tablet  1 tablet Oral Daily Clapacs, [name], MD   1 tablet at 05/14/17 [phone]  . nicotine (NICODERM CQ - dosed in mg/24 hours) patch 21 mg  21 mg Transdermal Daily McNew, [name], MD   21 mg at 05/14/17 6503  . ondansetron (ZOFRAN-ODT) disintegrating tablet 4 mg  4 mg Oral Q8H PRN Pucilowska, [name], MD      .  pantoprazole (PROTONIX) EC tablet 40 mg  40 mg Oral Daily Pucilowska, [name], MD   40 mg at 05/14/17 0837  . phenol-menthol (CEPASTAT) lozenge 1 lozenge  1 lozenge Buccal QID PRN Clapacs, [name], MD      . prochlorperazine (COMPAZINE) tablet 10 mg  10 mg Oral Q6H PRN Pucilowska, [name], MD   10 mg at 05/14/17 0840  . propranolol (INDERAL) tablet 10 mg  10 mg Oral TID Clapacs, [name], MD   10 mg at 05/14/17 1207  . senna-docusate (Senokot-S) tablet 1 tablet  1 tablet Oral BID Clapacs, [name], MD   1 tablet at 05/14/17 0855  . thiamine (VITAMIN B-1) tablet 100 mg  100 mg Oral Daily Clapacs, [name], MD   100 mg at 05/14/17 8469   Or  . thiamine (B-1) injection 100 mg  100 mg Intravenous Daily Clapacs, [name], MD      . traZODone (DESYREL) tablet 300 mg  300 mg Oral QHS Clapacs, [name], MD   300 mg at 05/13/17 2200  . tuberculin injection 5 Units  5 Units Intradermal Once Pucilowska, [name], MD   5 Units at 05/13/17 1806  . venlafaxine XR (EFFEXOR-XR) 24 hr capsule 150 mg  150 mg Oral Q breakfast Clapacs, [name], MD   150 mg at 05/14/17 6295    Lab Results:  No results found for this or any previous visit (from the past 48 hour(s)).  Blood Alcohol level:  Lab Results  Component Value Date   ETH <10 05/10/2017   ETH 189 (H) 28/41/3244    Metabolic Disorder Labs: Lab Results  Component Value Date   HGBA1C 5.0 05/12/2017   MPG 96.8 05/12/2017   MPG 97 07/05/2016   No results found for: PROLACTIN Lab Results  Component Value Date   CHOL 225 (H)  05/12/2017   TRIG 185 (H) 05/12/2017   HDL 58 05/12/2017   CHOLHDL 3.9 05/12/2017   VLDL 37 05/12/2017   LDLCALC 130 (H) 05/12/2017   LDLCALC 136 (H) 07/05/2016    Physical Findings: AIMS: Facial and Oral Movements Muscles of Facial Expression: None, normal Lips and Perioral Area: None, normal Jaw: None, normal Tongue: None, normal,Extremity Movements Upper (arms, wrists, hands, fingers): None, normal Lower (legs, knees, ankles, toes): None, normal, Trunk Movements Neck, shoulders, hips: None, normal, Overall Severity Severity of abnormal movements (highest score from questions above): None, normal Incapacitation due to abnormal movements: None, normal Patient's awareness of abnormal movements (rate only patient's report): No Awareness, Dental Status Current problems with teeth and/or dentures?: No Does patient usually wear dentures?: No  CIWA:  CIWA-Ar Total: 1 COWS:  COWS Total Score: 3  Musculoskeletal: Strength & Muscle Tone: within normal limits and decreased Gait & Station: unsteady Patient leans: N/A  Psychiatric Specialty Exam: Physical Exam  Nursing note and vitals reviewed. Psychiatric: Her speech is normal. Thought content normal. She is slowed. Cognition and memory are normal. She exhibits a depressed mood.    Review of Systems  Neurological: Positive for tremors.  Psychiatric/Behavioral: Positive for depression and substance abuse.  All other systems reviewed and are negative.   Blood pressure (!) 95/51, pulse 74, temperature 97.9 F (36.6 C), temperature source Oral, resp. rate 18, height 5\' 5"  (1.651 m), weight 142 lb (64.4 kg), SpO2 99 %.Body mass index is 23.63 kg/m.  General Appearance: Casual  Eye Contact:  Good  Speech:  Clear and Coherent  Volume:  Normal  Mood:  Anxious  and Depressed  Affect: A little more reactive than yesterday  Thought Process:  Goal Directed and Descriptions of Associations: Intact  Orientation:  Full (Time, Place, and  Person)  Thought Content:  WDL  Suicidal Thoughts:  Yes.  without intent/plan  Homicidal Thoughts:  No  Memory:  Immediate;   Fair Recent;   Fair Remote;   Fair  Judgement:  Poor  Insight:  Lacking  Psychomotor Activity:  Psychomotor Retardation  Concentration:  Concentration: Fair and Attention Span: Fair  Recall:  [employer] of Knowledge:  Fair  Language:  Fair  Akathisia:  No  Handed:  Right  AIMS (if indicated):     Assets:  Communication Skills Desire for Improvement Financial Resources/Insurance Housing Resilience Social Support  ADL's:  Intact  Cognition:  WNL  Sleep:  Number of Hours: 6.3     Treatment Plan Summary: Daily contact with patient to assess and evaluate symptoms and progress in treatment and Medication management   [name] is a 61 year old female with a history of depression and alcoholism transferred from medical floor where she was briefly hospitalized for alcohol detox.   #Suicidal ideation -patient able to contract for safety in the hospital  #Depression -continue Effexor 150 mg daily  #Chronic pain -Continue Neurontin 900 mg TID  #Insomnia -Continue Trazodone 300 mg nightly  #Alcohol withdrawal -Librium 25 mg QID for 3 days -refuses residential treatment of pharmacotherapy for alcoholism -open to [facility] IOP -Compazine 10 mg PRN nausea  #GERD -Protonix 40 mg daily  # URI -Continue Z-pack  #Smoking -Nicotine patch is available  #Constipation -Colace 200 mg BID -Bisacodyl -Added a one-time dose of magnesium citrate for constipation  #Falls -PT consult is appreciated -SNF recommended after discharge  #Disposition -discharge to SNF or home with home health -follow up with [facility] for medication management and Nonda Lou, MD 05/14/2017, 2:49 PM

## 2017-05-14 NOTE — Progress Notes (Signed)
Patient ID: [name], female   DOB: April 24, 1956, 61 y.o.   MRN: [id number]  Patient complains of constipation and has not had a bowel movement since last Thursday which makes it approximately 5-6 days.  Has used Colace 200 mg twice today with no productive results.  She has bowel sounds on exam.  Will use Dulcolax suppository 10 mg- rectal administration before we try enema.

## 2017-05-14 NOTE — Plan of Care (Signed)
  Education: Knowledge of disease or condition will improve 05/14/2017 0744 - Progressing by Harl Bowie, RN

## 2017-05-14 NOTE — Progress Notes (Signed)
Rates depression as 5/10 as well as anxiety. Denies SI/HI/AVH.  Visible in the milieu.  Interacting with peers and staff appropriately.  Complaining of constipation.  Dr. Angelique Holm made aware and orders received.  Support and encouragement offered.  Safety maintained.

## 2017-05-14 NOTE — [facility] Group Notes (Signed)
[place] Group Notes:  (Nursing/MHT/Case Management/Adjunct)  Date:  05/14/2017  Time:  1:40 AM  Type of Therapy:  Group Therapy  Participation Level:  Active  Participation Quality:  Sharing  Affect:  Appropriate  Cognitive:  Alert  Insight:  Good  Engagement in Group:  Engaged  Modes of Intervention:  Discussion  Summary of Progress/Problems:  [name] 05/14/2017, 1:40 AM

## 2017-05-14 NOTE — Progress Notes (Signed)
D: Patient is alert and oriented x 4, denies SI/HI/AVH, mood is pleasant and cooperative, thoughts are organized no withdrawal S/S from alcohol. Patient appears medication seeking always asking writer " give me everything possible" she appears less anxious this evening and she is interacting with peers and staff appropriately.  A: Pt was offered support and encouragement, and encouraged to attend groups.15 minute checks were done for safety.  R:Pt attends groups and interacts well with peers and staff, complaint with medication, safety maintained on unit.

## 2017-05-15 DIAGNOSIS — F332 Major depressive disorder, recurrent severe without psychotic features: Secondary | ICD-10-CM

## 2017-05-15 MED ORDER — AZITHROMYCIN 500 MG PO TABS
500.0000 mg | ORAL_TABLET | Freq: Every day | ORAL | Status: AC
  Administered 2017-05-15 – 2017-05-17 (×3): 500 mg via ORAL
  Filled 2017-05-15 (×3): qty 1

## 2017-05-15 NOTE — [facility] Group Notes (Signed)
[facility] Group Notes:  (Nursing/MHT/Case Management/Adjunct)  Date:  05/15/2017  Time:  9:43 PM  Type of Therapy:  Group Therapy  Participation Level:  Active  Participation Quality:  Appropriate  Affect:  Appropriate  Cognitive:  Alert  Insight:  Good  Engagement in Group:  Engaged  Modes of Intervention:  Support  Summary of Progress/Problems:  [name] 05/15/2017, 9:43 PM

## 2017-05-15 NOTE — Progress Notes (Signed)
CSW spoke with pt about SNF recommendation from PT.  Pt reports that she does not want to go to SNF as she has too many animals and other responsibilities at home.  She is willing to do home health.  CSW emailed Evalee Jefferson, RN, to initiate home health referral. Winferd Humphrey, MSW, LCSW Clinical Social Worker 05/15/2017 11:26 AM

## 2017-05-15 NOTE — Progress Notes (Signed)
Physical Therapy Treatment Patient Details Name: [name] MRN: [id number] DOB: 05/05/1956 Today's Date: 05/15/2017    History of Present Illness Pt is a 61 y/o F who was admitted to [place] [facility] from main [facility] due to depression and alcoholism.  Pt with low sodium upon initial admission and was treated for this on the floor before transferred to [facility].  Pt on CIWA protocol.  Pt's PMH includes cancer, insomnia, neuropathy, L arm amputation above elbow, lumbar laminectomy, Bil THA.    PT Comments    [name] was very eager and motivated to work with PT today.  She requires very close min guard assist while ambulating due to instability and requires several standing rest breaks due to fatigue.  Her 5xSTS time was 1 minute 19 seconds indicating the pt is at higher risk of falling with significant BLE weakness.  Thus, recommendation for SNF remains appropriate.     Follow Up Recommendations  SNF     Equipment Recommendations  Other (comment)(R loftstrand crutch)    Recommendations for Other Services       Precautions / Restrictions Precautions Precautions: Fall Restrictions Weight Bearing Restrictions: No    Mobility  Bed Mobility               General bed mobility comments: Pt sitting in WC at start and end of session  Transfers Overall transfer level: Needs assistance Equipment used: Lofstrands Transfers: Sit to/from Stand Sit to Stand: Min guard         General transfer comment: Close min guard and verbal cues for proper technique for sit<>stand transfers with R loftstrand.    Ambulation/Gait Ambulation/Gait assistance: Min guard Ambulation Distance (Feet): 80 Feet Assistive device: Lofstrands Gait Pattern/deviations: Step-through pattern;Decreased stride length Gait velocity: decreased Gait velocity interpretation: Below normal speed for age/gender General Gait Details: Pt with decreased gait speed and close min guard as pt  with mild instability.  Pt requires two standing rest breaks due to fatigue.    Stairs            Wheelchair Mobility    Modified Rankin (Stroke Patients Only)       Balance Overall balance assessment: Needs assistance;History of Falls Sitting-balance support: No upper extremity supported;Feet supported Sitting balance-Leahy Scale: Good     Standing balance support: No upper extremity supported;During functional activity Standing balance-Leahy Scale: Fair Standing balance comment: Pt able to stand statically without UE support but relies on UE support for dynamic activities                            Cognition Arousal/Alertness: Awake/alert Behavior During Therapy: WFL for tasks assessed/performed Overall Cognitive Status: Within Functional Limits for tasks assessed                                        Exercises General Exercises - Lower Extremity Long Arc Quad: Both;Seated;5 reps(with mod manual resistance on R, min on L) Other Exercises Other Exercises: Encouraged pt to continue using BLEs to self propel WC in hallway moving both forward and back to challenge different muscle groups.      General Comments General comments (skin integrity, edema, etc.): 5xSTS: 1 minute 18 seconds.  Indicating BLE weakness and impaired balance.       Pertinent Vitals/Pain Pain Assessment: Faces Faces Pain Scale: Hurts little  more Pain Location: LLE Pain Descriptors / Indicators: Aching;Discomfort Pain Intervention(s): Limited activity within patient's tolerance;Monitored during session    Home Living                      Prior Function            PT Goals (current goals can now be found in the care plan section) Acute Rehab PT Goals Patient Stated Goal: to get stronger and more independent PT Goal Formulation: With patient Time For Goal Achievement: 05/26/17 Potential to Achieve Goals: Good Progress towards PT goals: Progressing  toward goals    Frequency    Min 2X/week      PT Plan Current plan remains appropriate    Co-evaluation              AM-PAC PT "6 Clicks" Daily Activity  Outcome Measure  Difficulty turning over in bed (including adjusting bedclothes, sheets and blankets)?: A Little Difficulty moving from lying on back to sitting on the side of the bed? : A Little Difficulty sitting down on and standing up from a chair with arms (e.g., wheelchair, bedside commode, etc,.)?: A Little Help needed moving to and from a bed to chair (including a wheelchair)?: A Little Help needed walking in hospital room?: A Little Help needed climbing 3-5 steps with a railing? : A Little 6 Click Score: 18    End of Session Equipment Utilized During Treatment: Gait belt Activity Tolerance: Patient tolerated treatment well;Patient limited by fatigue Patient left: in chair;Other (comment)(in WC with MD in room) Nurse Communication: Mobility status PT Visit Diagnosis: Muscle weakness (generalized) (M62.81);Unsteadiness on feet (R26.81);Other abnormalities of gait and mobility (R26.89);History of falling (Z91.81)     Time: 1412-1430 PT Time Calculation (min) (ACUTE ONLY): 18 min  Charges:  $Gait Training: 8-22 mins                    G Codes:       [name] PT, DPT 05/15/2017, 4:21 PM

## 2017-05-15 NOTE — Progress Notes (Signed)
[facility] MD Progress Note  05/15/2017 3:34 PM [name]  MRN:  [id number]  Subjective:  [name] is a 61 year old female with amputated left arm and a history of alcoholism and mood instability admitted for worsening depression and alcohol detox.  Patient was constipated yesterday afternoon, but had 2 episodes of bowel incontinence last night.  Both these instances were loose stools.  Patient had one more episode of diarrhea this afternoon, has not had any more instances of bowel incontinence.  Continues to have sadness of mood but is more preoccupied with the diarrhea now.  Endorses that she feels all congested in her throat and nose and was on a regimen of azithromycin a week ago.  Would like the same regimen to be given now.  Treatment plan. We will continue her outpatient regimen of Effexor, Neurontin and Trazodone. Continue Librium for 3 days. Vital signs are stable.  Social/disposition. She will return home. Follow up with [facility].  Principal Problem: Severe recurrent major depression without psychotic features ([place]) Diagnosis:   Patient Active Problem List   Diagnosis Date Noted  . High anion gap metabolic acidosis [B52.0] 05/08/2017  . Overdose of antidepressant [T43.201A] 07/03/2016  . 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 Severe recurrent major depression without psychotic features ([place]) [F33.2] 07/03/2015  . Alcohol use disorder, severe, dependence ([place]) [F10.20] 01/26/2015  . Tobacco use disorder [F17.200] 01/26/2015  . Alcohol withdrawal ([place]) [F10.239] 01/08/2015  . 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 GAD (generalized anxiety disorder) [F41.1] 12/24/2013  . PTSD (post-traumatic stress disorder) [F43.10] 12/22/2013   Total Time spent with patient: 30 minutes  Past Psychiatric History: alcoholism.  Past Medical History:  Past Medical History:  Diagnosis Date  . Anxiety   . Anxiety    panic anxiety disorder  .  Arthritis   . Asthma    chronic asthmatic bronchitis  . Cancer [facility])    Desmoid tumor left forearm  . Depression   . ETOH abuse   . Heart murmur   . Insomnia   . Neuropathy     Past Surgical History:  Procedure Laterality Date  . ABDOMINAL HYSTERECTOMY    . ABDOMINAL HYSTERECTOMY  2007  . 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 ARM AMPUTATION AT SHOULDER Left   . CESAREAN SECTION    . [place]   x 2  . JOINT REPLACEMENT Right 2012   hip  . LUMBAR LAMINECTOMY/DECOMPRESSION MICRODISCECTOMY Left 04/16/2016   Procedure: LUMBAR LAMINECTOMY/DECOMPRESSION MICRODISCECTOMY 1 LEVEL;  Surgeon: Meade Maw, MD;  Location: [facility] ORS;  Service: Neurosurgery;  Laterality: Left;  Left L4-5 far lateral discectomy, left L4-5 laminoforaminotomy  . TOTAL HIP ARTHROPLASTY Left 09/17/2015   Procedure: TOTAL HIP ARTHROPLASTY;  Surgeon: Dereck Leep, MD;  Location: [facility] ORS;  Service: Orthopedics;  Laterality: Left;   Family History:  Family History  Problem Relation Age of Onset  . COPD Mother    Family Psychiatric  History: depression. Social History:  Social History   Substance and Sexual Activity  Alcohol Use Yes   Comment: 2 40oz beers daily     Social History   Substance and Sexual Activity  Drug Use No   Comment: Patient denies     Social History   Socioeconomic History  . Marital status: Divorced    Spouse name: None  . Number of children: None  . Years of education: None  .  Highest education level: None  Social Needs  . Financial resource strain: None  . Food insecurity - worry: None  . Food insecurity - inability: None  . Transportation needs - medical: None  . Transportation needs - non-medical: None  Occupational History  . None  Tobacco Use  . Smoking status: Current Every Day Smoker    Packs/day: 0.25    Types: Cigarettes  . Smokeless tobacco: Never Used  Substance and Sexual Activity  . Alcohol use: Yes     Comment: 2 40oz beers daily  . Drug use: No    Comment: Patient denies   . Sexual activity: Yes  Other Topics Concern  . None  Social History Narrative   ** Merged History Encounter **       Additional Social History:    History of alcohol / drug use?: Yes                    Sleep: Fair  Appetite:  Fair  Current Medications: Current Facility-Administered Medications  Medication Dose Route Frequency Provider Last Rate Last Dose  . acetaminophen (TYLENOL) tablet 650 mg  650 mg Oral Q6H PRN Clapacs, [name], MD   650 mg at 05/12/17 0453  . alum & mag hydroxide-simeth (MAALOX/MYLANTA) 200-200-20 MG/5ML suspension 30 mL  30 mL Oral Q4H PRN Clapacs, [name], MD      . azithromycin (ZITHROMAX) tablet 500 mg  500 mg Oral Daily Ramond Dial, MD      . bisacodyl (DULCOLAX) suppository 10 mg  10 mg Rectal Daily PRN Ramond Dial, MD      . docusate sodium (COLACE) capsule 200 mg  200 mg Oral BID Pucilowska, [name], MD   200 mg at 05/14/17 1715  . folic acid (FOLVITE) tablet 1 mg  1 mg Oral Daily Clapacs, [name], MD   1 mg at 05/15/17 0818  . gabapentin (NEURONTIN) capsule 900 mg  900 mg Oral TID Clapacs, [name], MD   900 mg at 05/15/17 1135  . magnesium hydroxide (MILK OF MAGNESIA) suspension 30 mL  30 mL Oral Daily PRN Clapacs, [name], MD   30 mL at 05/13/17 0825  . multivitamin with minerals tablet 1 tablet  1 tablet Oral Daily Clapacs, [name], MD   1 tablet at 05/15/17 0818  . nicotine (NICODERM CQ - dosed in mg/24 hours) patch 21 mg  21 mg Transdermal Daily McNew, [name], MD   21 mg at 05/15/17 0823  . ondansetron (ZOFRAN-ODT) disintegrating tablet 4 mg  4 mg Oral Q8H PRN Pucilowska, [name], MD   4 mg at 05/14/17 2119  . pantoprazole (PROTONIX) EC tablet 40 mg  40 mg Oral Daily Pucilowska, [name], MD   40 mg at 05/15/17 0818  . phenol-menthol (CEPASTAT) lozenge 1 lozenge  1 lozenge Buccal QID PRN Clapacs, [name], MD      . prochlorperazine (COMPAZINE) tablet 10  mg  10 mg Oral Q6H PRN Pucilowska, [name], MD   10 mg at 05/15/17 0818  . propranolol (INDERAL) tablet 10 mg  10 mg Oral TID Clapacs, [name], MD   10 mg at 05/15/17 1134  . senna-docusate (Senokot-S) tablet 1 tablet  1 tablet Oral BID Clapacs, [name], MD   1 tablet at 05/14/17 0855  . thiamine (VITAMIN B-1) tablet 100 mg  100 mg Oral Daily Clapacs, [name], MD   100 mg at 05/15/17 0818   Or  . thiamine (B-1)  injection 100 mg  100 mg Intravenous Daily Clapacs, [name], MD      . traZODone (DESYREL) tablet 300 mg  300 mg Oral QHS Clapacs, [name], MD   300 mg at 05/14/17 2113  . tuberculin injection 5 Units  5 Units Intradermal Once Pucilowska, [name], MD   5 Units at 05/13/17 1806  . venlafaxine XR (EFFEXOR-XR) 24 hr capsule 150 mg  150 mg Oral Q breakfast Clapacs, [name], MD   150 mg at 05/15/17 0818    Lab Results:  No results found for this or any previous visit (from the past 48 hour(s)).  Blood Alcohol level:  Lab Results  Component Value Date   ETH <10 05/10/2017   ETH 189 (H) 60/03/9322    Metabolic Disorder Labs: Lab Results  Component Value Date   HGBA1C 5.0 05/12/2017   MPG 96.8 05/12/2017   MPG 97 07/05/2016   No results found for: PROLACTIN Lab Results  Component Value Date   CHOL 225 (H) 05/12/2017   TRIG 185 (H) 05/12/2017   HDL 58 05/12/2017   CHOLHDL 3.9 05/12/2017   VLDL 37 05/12/2017   LDLCALC 130 (H) 05/12/2017   LDLCALC 136 (H) 07/05/2016    Physical Findings: AIMS: Facial and Oral Movements Muscles of Facial Expression: None, normal Lips and Perioral Area: None, normal Jaw: None, normal Tongue: None, normal,Extremity Movements Upper (arms, wrists, hands, fingers): None, normal Lower (legs, knees, ankles, toes): None, normal, Trunk Movements Neck, shoulders, hips: None, normal, Overall Severity Severity of abnormal movements (highest score from questions above): None, normal Incapacitation due to abnormal movements: None, normal Patient's  awareness of abnormal movements (rate only patient's report): No Awareness, Dental Status Current problems with teeth and/or dentures?: No Does patient usually wear dentures?: No  CIWA:  CIWA-Ar Total: 1 COWS:  COWS Total Score: 3  Musculoskeletal: Strength & Muscle Tone: within normal limits and decreased Gait & Station: unsteady Patient leans: N/A  Psychiatric Specialty Exam: Physical Exam  Nursing note and vitals reviewed. Psychiatric: Her speech is normal. Thought content normal. She is slowed. Cognition and memory are normal. She exhibits a depressed mood.    Review of Systems  Neurological: Positive for tremors.  Psychiatric/Behavioral: Positive for depression and substance abuse.  All other systems reviewed and are negative.   Blood pressure (!) 90/51, pulse 70, temperature 97.9 F (36.6 C), temperature source Oral, resp. rate 18, height 5\' 5"  (1.651 m), weight 142 lb (64.4 kg), SpO2 99 %.Body mass index is 23.63 kg/m.  General Appearance: Casual  Eye Contact:  Good  Speech:  Clear and Coherent  Volume:  Normal  Mood:  Anxious and Depressed  Affect: A little more reactive than yesterday  Thought Process:  Goal Directed and Descriptions of Associations: Intact  Orientation:  Full (Time, Place, and Person)  Thought Content:  WDL  Suicidal Thoughts:  Yes.  without intent/plan  Homicidal Thoughts:  No  Memory:  Immediate;   Fair Recent;   Fair Remote;   Fair  Judgement:  Poor  Insight:  Lacking  Psychomotor Activity:  Psychomotor Retardation  Concentration:  Concentration: Fair and Attention Span: Fair  Recall:  [employer] of Knowledge:  Fair  Language:  Fair  Akathisia:  No  Handed:  Right  AIMS (if indicated):     Assets:  Communication Skills Desire for Improvement Financial Resources/Insurance Housing Resilience Social Support  ADL's:  Intact  Cognition:  WNL  Sleep:  Number of Hours: 4.45  Treatment Plan Summary: Daily contact with patient to  assess and evaluate symptoms and progress in treatment and Medication management   [name] is a 61 year old female with a history of depression and alcoholism transferred from medical floor where she was briefly hospitalized for alcohol detox.   #Suicidal ideation -patient able to contract for safety in the hospital  #Depression -continue Effexor 150 mg daily  #Chronic pain -Continue Neurontin 900 mg TID  #Insomnia -Continue Trazodone 300 mg nightly  #Alcohol withdrawal -Librium taper is complete. -refuses residential treatment of pharmacotherapy for alcoholism -open to [facility] IOP -Compazine 10 mg PRN nausea  #GERD -Protonix 40 mg daily  # URI -Continue Z-pack  #Smoking -Nicotine patch is available  #Constipation Colace and Dulcolax are withheld for now due to diarrhea -Added a one-time dose of magnesium citrate for constipation  #Falls -PT consult is appreciated -SNF recommended after discharge  #Disposition -discharge to SNF or home with home health -follow up with [facility] for medication management and Nonda Lou, MD 05/15/2017, 3:34 PM

## 2017-05-15 NOTE — [facility] Group Notes (Signed)
[facility] Group Notes:  (Nursing/MHT/Case Management/Adjunct)  Date:  05/15/2017  Time:  6:49 PM  Type of Therapy:  Psychoeducational Skills  Participation Level:  Active  Participation Quality:  Appropriate, Attentive and Sharing  Affect:  Appropriate  Cognitive:  Appropriate  Insight:  Appropriate, Good and Improving  Engagement in Group:  Engaged and Improving  Modes of Intervention:  Discussion and Education  Summary of Progress/Problems:  Alois Cliche 05/15/2017, 6:49 PM

## 2017-05-15 NOTE — Progress Notes (Signed)
Patient ID: [name], female   DOB: 12/16/1955, 61 y.o.   MRN: [id number] Needy, demanding, seeking medications and attention, accusatory, verbally abusive, verbally aggressive, called me an "A--h---" for denying her medications that were not prescribed (enema, additional 100 mg of Trazodone); she was given Zofran and Compazine for N&V, medications discussed, explanations provided, refused to accept that some of her medications were either discontinued or completed, "I received them last night, you are the first person to not giving me medications..." BM incontinent in bed, new bed linen and scrubs provided; assistance provided to make bed.

## 2017-05-15 NOTE — Plan of Care (Signed)
Patient had another incontinent of Bowel, requested for imodium was dissuaded that imodium will be counter-productive of a positive and expected outcomes. Laxative was given to mitigate constipation.  Patient slept for Estimated Hours of 4.45; Precautionary checks every 15 minutes for safety maintained, room free of safety hazards, patient sustains no injury or falls during this shift.

## 2017-05-15 NOTE — Progress Notes (Signed)
While rounding, Pt asked for a consult. [place] agreed and asked the staff for a consult room. Pt expressed her desire to rebuild her relationship with God. Pt stated that she has a problem with alcohol abuse and is mad at God. She states that God has not met her needs and feels he has abandoned her. Drinking is her "medication". She feels ashamed for asking her local church for help. The [employer] has for two months, paid her rent and her bills. [place] asked if God could be meeting her needs through the church. Pt had not considered this. Pt asked for prayer which was provided.    05/15/17 1900  Clinical Encounter Type  Visited With Patient;Health care provider  Visit Type Initial;Spiritual support  Consult/Referral To Chaplain  Spiritual Encounters  Spiritual Needs Prayer

## 2017-05-15 NOTE — Progress Notes (Signed)
Recreation Therapy Notes  Date: 11.23.18  Time: 1:00 pm  Location: Craft Room  Behavioral response: Appropriate  Intervention Topic: Communication   Discussion/Intervention: Group content today was focused on communication. The group defined communication and ways to communicate with others. Individuals stated reason why communication is important and some reasons to communicate with others. Patients expressed if they thought they were good at communicating with others and ways they could improve their communication skills. The group identified important parts of communication and some experiences they have had in the past with communication. The group participated in the intervention "Back to Back", where they had a chance to test out their communication skills and identify ways to improve their communication techniques.  Clinical Observations/Feedback:  Patient came to group and identified touching as a form of communicating. Individual express that she does not like to communicate with people who are defensive or who are very negative. Individual stated she makes I statements to communicate well with others and to not put blame on others.She participated in the intervention and was social with peers and staff during group.  Jasimine Simms LRT/CTRS         Anabell Swint 05/15/2017 2:10 PM

## 2017-05-15 NOTE — [facility] Group Notes (Signed)
[facility] LCSW Group Therapy Note  Date/Time: 05/15/17, 0930  Type of Therapy and Topic:  Group Therapy:  Feelings around Relapse and Recovery  Participation Level:  Active   Mood:pleasant  Description of Group:    Patients in this group will discuss emotions they experience before and after a relapse. They will process how experiencing these feelings, or avoidance of experiencing them, relates to having a relapse. Facilitator will guide patients to explore emotions they have related to recovery. Patients will be encouraged to process which emotions are more powerful. They will be guided to discuss the emotional reaction significant others in their lives may have to patients' relapse or recovery. Patients will be assisted in exploring ways to respond to the emotions of others without this contributing to a relapse.  Therapeutic Goals: 1. Patient will identify two or more emotions that lead to relapse for them:  2. Patient will identify two emotions that result when they relapse:  3. Patient will identify two emotions related to recovery:  4. Patient will demonstrate ability to communicate their needs through discussion and/or role plays.   Summary of Patient Progress: Pt was very involved in group discussion and disclosed that relapse has been an issue for her a number of times. Pt shared that anger is a difficult emotion for her that can lead to relapse.  Pt talked about recovery and her involvement with AA and made a number of appropriate comments throughout.     Therapeutic Modalities:   Cognitive Behavioral Therapy Solution-Focused Therapy Assertiveness Training Relapse Prevention Therapy  Lurline Idol, LCSW

## 2017-05-16 DIAGNOSIS — F332 Major depressive disorder, recurrent severe without psychotic features: Secondary | ICD-10-CM

## 2017-05-16 NOTE — Plan of Care (Signed)
Pt safe on the unit at this time

## 2017-05-16 NOTE — Plan of Care (Signed)
  Health Behavior/Discharge Planning: Ability to identify changes in lifestyle to reduce recurrence of condition will improve 05/16/2017 0714 - Progressing by Harl Bowie, RN

## 2017-05-16 NOTE — [facility] Group Notes (Signed)
[facility] Group Notes:  (Nursing/MHT/Case Management/Adjunct)  Date:  05/16/2017  Time:  10:32 PM  Type of Therapy:  Psychoeducational Skills  Participation Level:  Active  Participation Quality:  Appropriate  Affect:  Appropriate  Cognitive:  Appropriate  Insight:  Appropriate  Engagement in Group:  Engaged  Modes of Intervention:  Discussion, Socialization and Support  Summary of Progress/Problems:  Joretta Bachelor 05/16/2017, 10:32 PM

## 2017-05-16 NOTE — Plan of Care (Signed)
  Education: Knowledge of disease or condition will improve 05/16/2017 0714 - Progressing by Harl Bowie, RN 05/16/2017 [id number] - Progressing by Harl Bowie, RN

## 2017-05-16 NOTE — Progress Notes (Signed)
D: Pt denies SI/HI/AVH. Pt is pleasant and cooperative. Pt stated she was feeling better due to being out of withdrawals. Pt said her mind felt clear. Pt visible on the milieu, pt had bright affect this evening, pt concern was about her low Bp. Pt encouraged to drink gatorade.   A: Pt was offered support and encouragement. Pt was given scheduled medications. Pt was encourage to attend groups. Q 15 minute checks were done for safety.   R:Pt attends groups and interacts well with peers and staff. Pt is taking medication. Pt has no complaints.Pt receptive to treatment and safety maintained on unit. Pt stated she will try Gatorade

## 2017-05-16 NOTE — Plan of Care (Signed)
Patient states that she slept well last night and presents in a pleasant mood on the unit at this time. Patient understands her condition and is telling this Probation officer about what she's been through and that she is feeling better than she has in a long time. Patient states that she is not experiencing any withdrawal symptoms at this time. Patient is cooperative and going to all groups on the unit. Patient has remained free from injury and is safe on the unit at this time.

## 2017-05-16 NOTE — Progress Notes (Signed)
[name] Progress Note  05/16/2017 4:27 PM [name]  MRN:  [id number]  Subjective:  [name] is a 61 year old female with amputated left arm and a history of alcoholism and mood instability admitted for worsening depression and alcohol detox.  Patient has not had any episodes of loose stools since yesterday.  Has had instances of low blood pressure today.  Has had to skip her propranolol secondary to this.  Endorses that she cannot take hydroxyzine.  Is allergic to the medication.  Is concerned about her low blood pressure.  Her throat has been feeling better after she took her first dose of azithromycin yesterday.  Treatment plan. We will continue her outpatient regimen of Effexor, Neurontin and Trazodone. Continue Librium for 3 days. Vital signs are stable.  Social/disposition. She will return home. Follow up with [facility].  Principal Problem: Severe recurrent major depression without psychotic features ([place]) Diagnosis:   Patient Active Problem List   Diagnosis Date Noted  . High anion gap metabolic acidosis [Y65.0] 05/08/2017  . Overdose of antidepressant [T43.201A] 07/03/2016  . 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 Severe recurrent major depression without psychotic features ([place]) [F33.2] 07/03/2015  . Alcohol use disorder, severe, dependence ([place]) [F10.20] 01/26/2015  . Tobacco use disorder [F17.200] 01/26/2015  . Alcohol withdrawal ([place]) [F10.239] 01/08/2015  . 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 GAD (generalized anxiety disorder) [F41.1] 12/24/2013  . PTSD (post-traumatic stress disorder) [F43.10] 12/22/2013   Total Time spent with patient: 30 minutes  Past Psychiatric History: alcoholism.  Past Medical History:  Past Medical History:  Diagnosis Date  . Anxiety   . Anxiety    panic anxiety disorder  . Arthritis   . Asthma    chronic asthmatic bronchitis  . Cancer [facility])    Desmoid tumor left forearm  .  Depression   . ETOH abuse   . Heart murmur   . Insomnia   . Neuropathy     Past Surgical History:  Procedure Laterality Date  . ABDOMINAL HYSTERECTOMY    . ABDOMINAL HYSTERECTOMY  2007  . 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 ARM AMPUTATION AT SHOULDER Left   . CESAREAN SECTION    . [place]   x 2  . JOINT REPLACEMENT Right 2012   hip  . LUMBAR LAMINECTOMY/DECOMPRESSION MICRODISCECTOMY Left 04/16/2016   Procedure: LUMBAR LAMINECTOMY/DECOMPRESSION MICRODISCECTOMY 1 LEVEL;  Surgeon: Meade Maw, MD;  Location: [facility] ORS;  Service: Neurosurgery;  Laterality: Left;  Left L4-5 far lateral discectomy, left L4-5 laminoforaminotomy  . TOTAL HIP ARTHROPLASTY Left 09/17/2015   Procedure: TOTAL HIP ARTHROPLASTY;  Surgeon: Dereck Leep, MD;  Location: [facility] ORS;  Service: Orthopedics;  Laterality: Left;   Family History:  Family History  Problem Relation Age of Onset  . COPD Mother    Family Psychiatric  History: depression. Social History:  Social History   Substance and Sexual Activity  Alcohol Use Yes   Comment: 2 40oz beers daily     Social History   Substance and Sexual Activity  Drug Use No   Comment: Patient denies     Social History   Socioeconomic History  . Marital status: Divorced    Spouse name: None  . Number of children: None  . Years of education: None  . Highest education level: None  Social Needs  . Financial resource strain: None  . Food insecurity - worry:  None  . Food insecurity - inability: None  . Transportation needs - medical: None  . Transportation needs - non-medical: None  Occupational History  . None  Tobacco Use  . Smoking status: Current Every Day Smoker    Packs/day: 0.25    Types: Cigarettes  . Smokeless tobacco: Never Used  Substance and Sexual Activity  . Alcohol use: Yes    Comment: 2 40oz beers daily  . Drug use: No    Comment: Patient denies   . Sexual activity: Yes  Other  Topics Concern  . None  Social History Narrative   ** Merged History Encounter **       Additional Social History:    History of alcohol / drug use?: Yes                    Sleep: Fair  Appetite:  Fair  Current Medications: Current Facility-Administered Medications  Medication Dose Route Frequency Provider Last Rate Last Dose  . acetaminophen (TYLENOL) tablet 650 mg  650 mg Oral Q6H PRN Clapacs, [name], MD   650 mg at 05/12/17 0453  . alum & mag hydroxide-simeth (MAALOX/MYLANTA) 200-200-20 MG/5ML suspension 30 mL  30 mL Oral Q4H PRN Clapacs, [name], MD      . azithromycin (ZITHROMAX) tablet 500 mg  500 mg Oral Daily Ramond Dial, MD   500 mg at 05/16/17 0830  . bisacodyl (DULCOLAX) suppository 10 mg  10 mg Rectal Daily PRN Ramond Dial, MD      . docusate sodium (COLACE) capsule 200 mg  200 mg Oral BID Pucilowska, [name], MD   200 mg at 05/14/17 1715  . folic acid (FOLVITE) tablet 1 mg  1 mg Oral Daily Clapacs, [name], MD   1 mg at 05/16/17 0830  . gabapentin (NEURONTIN) capsule 900 mg  900 mg Oral TID Clapacs, [name], MD   900 mg at 05/16/17 1256  . magnesium hydroxide (MILK OF MAGNESIA) suspension 30 mL  30 mL Oral Daily PRN Clapacs, [name], MD   30 mL at 05/13/17 0825  . multivitamin with minerals tablet 1 tablet  1 tablet Oral Daily Clapacs, [name], MD   1 tablet at 05/16/17 0830  . nicotine (NICODERM CQ - dosed in mg/24 hours) patch 21 mg  21 mg Transdermal Daily McNew, [name], MD   21 mg at 05/16/17 3244  . ondansetron (ZOFRAN-ODT) disintegrating tablet 4 mg  4 mg Oral Q8H PRN Pucilowska, [name], MD   4 mg at 05/16/17 0830  . pantoprazole (PROTONIX) EC tablet 40 mg  40 mg Oral Daily Pucilowska, [name], MD   40 mg at 05/16/17 0830  . phenol-menthol (CEPASTAT) lozenge 1 lozenge  1 lozenge Buccal QID PRN Clapacs, [name], MD      . prochlorperazine (COMPAZINE) tablet 10 mg  10 mg Oral Q6H PRN Pucilowska, [name], MD   10 mg at 05/15/17 0818  .  propranolol (INDERAL) tablet 10 mg  10 mg Oral TID Clapacs, [name], MD   10 mg at 05/16/17 0830  . senna-docusate (Senokot-S) tablet 1 tablet  1 tablet Oral BID Clapacs, [name], MD   1 tablet at 05/14/17 0855  . thiamine (VITAMIN B-1) tablet 100 mg  100 mg Oral Daily Clapacs, [name], MD   100 mg at 05/16/17 0830   Or  . thiamine (B-1) injection 100 mg  100 mg Intravenous Daily Clapacs, [name], MD      .  traZODone (DESYREL) tablet 300 mg  300 mg Oral QHS Clapacs, [name], MD   300 mg at 05/15/17 2124  . venlafaxine XR (EFFEXOR-XR) 24 hr capsule 150 mg  150 mg Oral Q breakfast Clapacs, [name], MD   150 mg at 05/16/17 0830    Lab Results:  No results found for this or any previous visit (from the past 48 hour(s)).  Blood Alcohol level:  Lab Results  Component Value Date   ETH <10 05/10/2017   ETH 189 (H) 65/46/5035    Metabolic Disorder Labs: Lab Results  Component Value Date   HGBA1C 5.0 05/12/2017   MPG 96.8 05/12/2017   MPG 97 07/05/2016   No results found for: PROLACTIN Lab Results  Component Value Date   CHOL 225 (H) 05/12/2017   TRIG 185 (H) 05/12/2017   HDL 58 05/12/2017   CHOLHDL 3.9 05/12/2017   VLDL 37 05/12/2017   LDLCALC 130 (H) 05/12/2017   LDLCALC 136 (H) 07/05/2016    Physical Findings: AIMS: Facial and Oral Movements Muscles of Facial Expression: None, normal Lips and Perioral Area: None, normal Jaw: None, normal Tongue: None, normal,Extremity Movements Upper (arms, wrists, hands, fingers): None, normal Lower (legs, knees, ankles, toes): None, normal, Trunk Movements Neck, shoulders, hips: None, normal, Overall Severity Severity of abnormal movements (highest score from questions above): None, normal Incapacitation due to abnormal movements: None, normal Patient's awareness of abnormal movements (rate only patient's report): No Awareness, Dental Status Current problems with teeth and/or dentures?: No Does patient usually wear dentures?: No  CIWA:   CIWA-Ar Total: 1 COWS:  COWS Total Score: 3  Musculoskeletal: Strength & Muscle Tone: Normal Gait & Station: unsteady Patient leans: N/A  Psychiatric Specialty Exam: Physical Exam  Nursing note and vitals reviewed. Psychiatric: Her speech is normal. Thought content normal. She is slowed. Cognition and memory are normal. She exhibits a depressed mood.    Review of Systems  Psychiatric/Behavioral: Positive for depression and substance abuse.  All other systems reviewed and are negative.   Blood pressure (!) 102/47, pulse 70, temperature 97.7 F (36.5 C), temperature source Oral, resp. rate 18, height 5\' 5"  (1.651 m), weight 142 lb (64.4 kg), SpO2 99 %.Body mass index is 23.63 kg/m.  General Appearance: Casual  Eye Contact:  Good  Speech:  Clear and Coherent  Volume:  Normal  Mood:  Anxious and Depressed  Affect: A little more reactive than yesterday  Thought Process:  Goal Directed and Descriptions of Associations: Intact  Orientation:  Full (Time, Place, and Person)  Thought Content:  WDL  Suicidal Thoughts:  Yes.  without intent/plan  Homicidal Thoughts:  No  Memory:  Immediate;   Fair Recent;   Fair Remote;   Fair  Judgement:  Poor  Insight:  Lacking  Psychomotor Activity:  Psychomotor Retardation  Concentration:  Concentration: Fair and Attention Span: Fair  Recall:  [employer] of Knowledge:  Fair  Language:  Fair  Akathisia:  No  Handed:  Right  AIMS (if indicated):     Assets:  Communication Skills Desire for Improvement Financial Resources/Insurance Housing Resilience Social Support  ADL's:  Intact  Cognition:  WNL  Sleep:  Number of Hours: 6.45     Treatment Plan Summary: Daily contact with patient to assess and evaluate symptoms and progress in treatment and Medication management   Ms. Witherell is a 61 year old female with a history of depression and alcoholism transferred from medical floor where she was briefly hospitalized for alcohol detox.  She  continues to do well from a mood disorder standpoint and has successfully detoxed from alcohol.  What seems to be concerning no other medical issues mainly hypotension.  We will continue to monitor for this.  #Suicidal ideation -patient able to contract for safety in the hospital  #Depression -continue Effexor 150 mg daily  #Chronic pain -Continue Neurontin 900 mg TID  #Insomnia -Continue Trazodone 300 mg nightly  #Alcohol withdrawal -Librium taper is complete. -refuses residential treatment of pharmacotherapy for alcoholism -open to [facility] IOP -Compazine 10 mg PRN nausea  #GERD -Protonix 40 mg daily  # URI -Continue Z-pack  #Smoking -Nicotine patch is available  #Hypotension -Monitor blood pressures -Advised adequate hydration  #Falls -PT consult is appreciated -SNF recommended after discharge  #Disposition -discharge to SNF or home with home health -follow up with [facility] for medication management and Nonda Lou, MD 05/16/2017, 4:27 PM

## 2017-05-16 NOTE — Progress Notes (Signed)
D:Patient is alert and oriented x 4,denies SI/HI/AVH, mood is pleasant and cooperative, her  thoughts are organized no withdrawal S/S from alcohol, thought process are organized rated depression a 5/10 on a scale of ( 0-10). Patient appears less anxious this evening and she is interacting with peers and staff appropriately.  A:Pt was offered support and encouragement, andencouraged to attend groups.15 minute checks were done for safety.  R:Pt attends groups and interacts well with peers and staff, complaint withmedication,safety maintained on unit.

## 2017-05-16 NOTE — [facility] Group Notes (Signed)
05/16/2017 1:15pm  Type of Therapy and Topic: Group Therapy: Feelings Around Returning Home & Establishing a Supportive Framework and Supporting Oneself When Supports Not Available  Participation Level: Active  Description of Group:  Patients first processed thoughts and feelings about upcoming discharge. These included fears of upcoming changes, lack of change, new living environments, judgements and expectations from others and overall stigma of mental health issues. The group then discussed the definition of a supportive framework, what that looks and feels like, and how do to discern it from an unhealthy non-supportive network. The group identified different types of supports as well as what to do when your family/friends are less than helpful or unavailable  Therapeutic Goals  1. Patient will identify one healthy supportive network that they can use at discharge. 2. Patient will identify one factor of a supportive framework and how to tell it from an unhealthy network. 3. Patient able to identify one coping skill to use when they do not have positive supports from others. 4. Patient will demonstrate ability to communicate their needs through discussion and/or role plays.  Summary of Patient Progress:  Pt engaged during group session. As patients processed their anxiety about discharge and described healthy supports patient shared her fears about her upcoming discharge. Patients identified at least one self-care tool they were willing to use after discharge.   Therapeutic Modalities Cognitive Behavioral Therapy Motivational Interviewing   EMBRY MANRIQUE, [place] 05/16/2017 12:54 PM

## 2017-05-16 NOTE — Progress Notes (Signed)
D- Patient alert and oriented. Patient presents in a pleasant mood. Patient states that she is feeling much better than she has been in the past. When questioned by this Patient denies SI, HI, AVH, and pain at this time, patient replies "not yet" to everything this writer asks in a jokingly manner.   A- Scheduled medications administered to patient, per MD orders. Support and encouragement provided.  Routine safety checks conducted every 15 minutes.  Patient informed to notify staff with problems or concerns.  R- No adverse drug reactions noted. Patient contracts for safety at this time. Patient compliant with medications and treatment plan. Patient receptive, calm, and cooperative. Patient interacts well with others on the unit.  Patient remains safe at this time.

## 2017-05-17 DIAGNOSIS — F332 Major depressive disorder, recurrent severe without psychotic features: Secondary | ICD-10-CM

## 2017-05-17 NOTE — [facility] Group Notes (Signed)
05/17/2017 1:15pm  Type of Therapy and Topic: Group Therapy: Holding on to Grudges   Participation Level: Active   Description of Group:  In this group patients will be asked to explore and define a grudge. Patients will be guided to discuss their thoughts, feelings, and reasons as to why people have grudges. Patients will process the impact grudges have on daily life and identify thoughts and feelings related to holding grudges. Facilitator will challenge patients to identify ways to let go of grudges and the benefits this provides. Patients will be confronted to address why one struggles letting go of grudges. Lastly, patients will identify feelings and thoughts related to what life would look like without grudges. This group will be process-oriented, with patients participating in exploration of their own experiences, giving and receiving support, and processing challenge from other group members.  Therapeutic Goals:  1. Patient will identify specific grudges related to their personal life.  2. Patient will identify feelings, thoughts, and beliefs around grudges.  3. Patient will identify how one releases grudges appropriately.  4. Patient will identify situations where they could have let go of the grudge, but instead chose to hold on.   Summary of Patient Progress:Pt identified specific grudges that are affecting her life. She was able to process her feelings, thoughts and beliefs around grudges. Pt shared that she has learned to deal with her previous grudges through therapy and AA meetings. Pt reported that holding on to negative feelings was an excuse for her to drink alcohol. Pt identified talking about the problem and letting go as ways to release grudges appropriately. Pt fully engaged in group discussion.   Therapeutic Modalities:  Cognitive Behavioral Therapy  Solution Focused Therapy  Motivational Interviewing  Brief Therapy   Markeria Goetsch  CUEBAS-COLON, [place] 05/17/2017 11:50  AM

## 2017-05-17 NOTE — Progress Notes (Signed)
Pt Bp may need to be taken manually

## 2017-05-17 NOTE — Progress Notes (Addendum)
[facility] MD Progress Note  05/17/2017 12:19 PM [name]  MRN:  [id number]  Subjective:  [name] is a 61 year old female with amputated left arm and a history of alcoholism and mood instability admitted for worsening depression and alcohol detox.  Patient has been feeling lightheaded with lower than usual blood pressures.  Has some degree of postural hypotension.  Has been drinking fluids regularly.  Endorses that she feels tired as well.  Has not had any more loose stools after 05/15/2017.  Treatment plan. We will continue her outpatient regimen of Effexor, Neurontin and Trazodone. Continue Librium for 3 days. Vital signs are stable.  Social/disposition. She will return home. Follow up with [facility].  Principal Problem: Severe recurrent major depression without psychotic features ([place]) Diagnosis:   Patient Active Problem List   Diagnosis Date Noted  . High anion gap metabolic acidosis [J03.0] 05/08/2017  . Overdose of antidepressant [T43.201A] 07/03/2016  . 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 Severe recurrent major depression without psychotic features ([place]) [F33.2] 07/03/2015  . Alcohol use disorder, severe, dependence ([place]) [F10.20] 01/26/2015  . Tobacco use disorder [F17.200] 01/26/2015  . Alcohol withdrawal ([place]) [F10.239] 01/08/2015  . 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 GAD (generalized anxiety disorder) [F41.1] 12/24/2013  . PTSD (post-traumatic stress disorder) [F43.10] 12/22/2013   Total Time spent with patient: 30 minutes  Past Psychiatric History: alcoholism.  Past Medical History:  Past Medical History:  Diagnosis Date  . Anxiety   . Anxiety    panic anxiety disorder  . Arthritis   . Asthma    chronic asthmatic bronchitis  . Cancer [name])    Desmoid tumor left forearm  . Depression   . ETOH abuse   . Heart murmur   . Insomnia   . Neuropathy     Past Surgical History:  Procedure Laterality Date  .  ABDOMINAL HYSTERECTOMY    . ABDOMINAL HYSTERECTOMY  2007  . 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 ARM AMPUTATION AT SHOULDER Left   . CESAREAN SECTION    . [place]   x 2  . JOINT REPLACEMENT Right 2012   hip  . LUMBAR LAMINECTOMY/DECOMPRESSION MICRODISCECTOMY Left 04/16/2016   Procedure: LUMBAR LAMINECTOMY/DECOMPRESSION MICRODISCECTOMY 1 LEVEL;  Surgeon: Meade Maw, MD;  Location: [facility] ORS;  Service: Neurosurgery;  Laterality: Left;  Left L4-5 far lateral discectomy, left L4-5 laminoforaminotomy  . TOTAL HIP ARTHROPLASTY Left 09/17/2015   Procedure: TOTAL HIP ARTHROPLASTY;  Surgeon: Dereck Leep, MD;  Location: [facility] ORS;  Service: Orthopedics;  Laterality: Left;   Family History:  Family History  Problem Relation Age of Onset  . COPD Mother    Family Psychiatric  History: depression. Social History:  Social History   Substance and Sexual Activity  Alcohol Use Yes   Comment: 2 40oz beers daily     Social History   Substance and Sexual Activity  Drug Use No   Comment: Patient denies     Social History   Socioeconomic History  . Marital status: Divorced    Spouse name: None  . Number of children: None  . Years of education: None  . Highest education level: None  Social Needs  . Financial resource strain: None  . Food insecurity - worry: None  . Food insecurity - inability: None  . Transportation needs - medical: None  . Transportation needs - non-medical: None  Occupational History  .  None  Tobacco Use  . Smoking status: Current Every Day Smoker    Packs/day: 0.25    Types: Cigarettes  . Smokeless tobacco: Never Used  Substance and Sexual Activity  . Alcohol use: Yes    Comment: 2 40oz beers daily  . Drug use: No    Comment: Patient denies   . Sexual activity: Yes  Other Topics Concern  . None  Social History Narrative   ** Merged History Encounter **       Additional Social History:    History of  alcohol / drug use?: Yes                    Sleep: Fair  Appetite:  Fair  Current Medications: Current Facility-Administered Medications  Medication Dose Route Frequency Provider Last Rate Last Dose  . acetaminophen (TYLENOL) tablet 650 mg  650 mg Oral Q6H PRN Clapacs, [name], MD   650 mg at 05/12/17 0453  . alum & mag hydroxide-simeth (MAALOX/MYLANTA) 200-200-20 MG/5ML suspension 30 mL  30 mL Oral Q4H PRN Clapacs, [name], MD      . bisacodyl (DULCOLAX) suppository 10 mg  10 mg Rectal Daily PRN Ramond Dial, MD      . docusate sodium (COLACE) capsule 200 mg  200 mg Oral BID Pucilowska, [name], MD   200 mg at 05/17/17 0834  . folic acid (FOLVITE) tablet 1 mg  1 mg Oral Daily Clapacs, [name], MD   1 mg at 05/17/17 0834  . gabapentin (NEURONTIN) capsule 900 mg  900 mg Oral TID Clapacs, [name], MD   900 mg at 05/17/17 0834  . magnesium hydroxide (MILK OF MAGNESIA) suspension 30 mL  30 mL Oral Daily PRN Clapacs, [name], MD   30 mL at 05/13/17 0825  . multivitamin with minerals tablet 1 tablet  1 tablet Oral Daily Clapacs, [name], MD   1 tablet at 05/17/17 [phone]  . nicotine (NICODERM CQ - dosed in mg/24 hours) patch 21 mg  21 mg Transdermal Daily McNew, [name], MD   21 mg at 05/17/17 0836  . ondansetron (ZOFRAN-ODT) disintegrating tablet 4 mg  4 mg Oral Q8H PRN Pucilowska, [name], MD   4 mg at 05/16/17 0830  . pantoprazole (PROTONIX) EC tablet 40 mg  40 mg Oral Daily Pucilowska, [name], MD   40 mg at 05/17/17 0835  . phenol-menthol (CEPASTAT) lozenge 1 lozenge  1 lozenge Buccal QID PRN Clapacs, [name], MD      . prochlorperazine (COMPAZINE) tablet 10 mg  10 mg Oral Q6H PRN Pucilowska, [name], MD   10 mg at 05/15/17 0818  . propranolol (INDERAL) tablet 10 mg  10 mg Oral TID Clapacs, [name], MD   10 mg at 05/16/17 1713  . senna-docusate (Senokot-S) tablet 1 tablet  1 tablet Oral BID Clapacs, [name], MD   1 tablet at 05/17/17 [phone]  . thiamine (VITAMIN B-1) tablet 100 mg  100  mg Oral Daily Clapacs, [name], MD   100 mg at 05/17/17 0835  . traZODone (DESYREL) tablet 300 mg  300 mg Oral QHS Clapacs, [name], MD   300 mg at 05/16/17 2119  . venlafaxine XR (EFFEXOR-XR) 24 hr capsule 150 mg  150 mg Oral Q breakfast Clapacs, [name], MD   150 mg at 05/17/17 9528    Lab Results:  No results found for this or any previous visit (from the past 48 hour(s)).  Blood  Alcohol level:  Lab Results  Component Value Date   ETH <10 05/10/2017   ETH 189 (H) 61/60/7371    Metabolic Disorder Labs: Lab Results  Component Value Date   HGBA1C 5.0 05/12/2017   MPG 96.8 05/12/2017   MPG 97 07/05/2016   No results found for: PROLACTIN Lab Results  Component Value Date   CHOL 225 (H) 05/12/2017   TRIG 185 (H) 05/12/2017   HDL 58 05/12/2017   CHOLHDL 3.9 05/12/2017   VLDL 37 05/12/2017   LDLCALC 130 (H) 05/12/2017   LDLCALC 136 (H) 07/05/2016    Physical Findings: AIMS: Facial and Oral Movements Muscles of Facial Expression: None, normal Lips and Perioral Area: None, normal Jaw: None, normal Tongue: None, normal,Extremity Movements Upper (arms, wrists, hands, fingers): None, normal Lower (legs, knees, ankles, toes): None, normal, Trunk Movements Neck, shoulders, hips: None, normal, Overall Severity Severity of abnormal movements (highest score from questions above): None, normal Incapacitation due to abnormal movements: None, normal Patient's awareness of abnormal movements (rate only patient's report): No Awareness, Dental Status Current problems with teeth and/or dentures?: No Does patient usually wear dentures?: No  CIWA:  CIWA-Ar Total: 1 COWS:  COWS Total Score: 3  Musculoskeletal: Strength & Muscle Tone: Normal Gait & Station: unsteady Patient leans: N/A  Psychiatric Specialty Exam: Physical Exam  Nursing note and vitals reviewed. Psychiatric: Her speech is normal. Thought content normal. She is slowed. Cognition and memory are normal. She exhibits a  depressed mood.    Review of Systems  Psychiatric/Behavioral: Positive for depression and substance abuse.  All other systems reviewed and are negative.   Blood pressure 122/71, pulse 68, temperature 98.6 F (37 C), temperature source Oral, resp. rate 16, height 5\' 5"  (1.651 m), weight 142 lb (64.4 kg), SpO2 99 %.Body mass index is 23.63 kg/m.  General Appearance: Casual  Eye Contact:  Good  Speech:  Clear and Coherent  Volume:  Normal  Mood:  Anxious and Depressed  Affect: Reactive  Thought Process:  Goal Directed and Descriptions of Associations: Intact  Orientation:  Full (Time, Place, and Person)  Thought Content:  WDL  Suicidal Thoughts:  Yes.  without intent/plan  Homicidal Thoughts:  No  Memory:  Immediate;   Fair Recent;   Fair Remote;   Fair  Judgement:  Poor  Insight:  Lacking  Psychomotor Activity:  Psychomotor Retardation  Concentration:  Concentration: Fair and Attention Span: Fair  Recall:  [employer] of Knowledge:  Fair  Language:  Fair  Akathisia:  No  Handed:  Right  AIMS (if indicated):     Assets:  Communication Skills Desire for Improvement Financial Resources/Insurance Housing Resilience Social Support  ADL's:  Intact  Cognition:  WNL  Sleep:  Number of Hours: 7.45     Treatment Plan Summary: Daily contact with patient to assess and evaluate symptoms and progress in treatment and Medication management   [name] is a 61 year old female with a history of depression and alcoholism transferred from medical floor where she was briefly hospitalized for alcohol detox.  She continues to do well from a mood disorder standpoint and has successfully detoxed from alcohol.  Following the loose stools she has had low blood pressures including some degree of postural hypotension.  We will continue to monitor for this.  Denies suicidal ideation or thoughts about dying.  #Depression -continue Effexor 150 mg daily -Takes propranolol 10 mg 3 times daily for  anxiety symptoms.  Endorses that hydroxyzine does not work for  her.  Has not been able to take the propranolol because of low blood pressures.  #Chronic pain -Continue Neurontin 900 mg TID  #Insomnia -Continue Trazodone 300 mg nightly  #Alcohol withdrawal -Librium taper is complete. -refuses residential treatment of pharmacotherapy for alcoholism -open to [facility] IOP -Compazine 10 mg PRN nausea  #GERD -Protonix 40 mg daily  # URI -Continue Z-pack  #Smoking -Nicotine patch is available  #Hypotension possibly associated with a viral infection. -Monitor blood pressures -Advised adequate hydration  #Falls -PT consult is appreciated -SNF recommended after discharge  #Disposition -discharge to SNF or home with home health -follow up with [facility] for medication management and Nonda Lou, MD 05/17/2017, 12:19 PM

## 2017-05-17 NOTE — Progress Notes (Signed)
Patient ID: [name], female   DOB: 1955-12-03, 61 y.o.   MRN: [id number]    D: Pt has been appropriate today on the unit. Pt remained in the dayroom most of the day interacting with staff and peers. Pt attended all groups and engaged in treatment. Pt did complain of nausea today and was given medication. Pt reported that the medication helped. Pt reported being negative SI/HI, no AH/VH noted. A: 15 min checks continued for patient safety. R: Pt safety maintained.

## 2017-05-18 MED ORDER — ASENAPINE MALEATE 5 MG SL SUBL
10.0000 mg | SUBLINGUAL_TABLET | Freq: Two times a day (BID) | SUBLINGUAL | Status: DC | PRN
  Administered 2017-05-18: 10 mg via SUBLINGUAL
  Filled 2017-05-18: qty 2

## 2017-05-18 MED ORDER — TEMAZEPAM 15 MG PO CAPS: 15.0000 mg | ORAL_CAPSULE | Freq: Every evening | ORAL | Status: DC | PRN

## 2017-05-18 MED ORDER — DIVALPROEX SODIUM 500 MG PO DR TAB
500.0000 mg | DELAYED_RELEASE_TABLET | Freq: Three times a day (TID) | ORAL | Status: DC
  Administered 2017-05-18 – 2017-05-21 (×9): 500 mg via ORAL
  Filled 2017-05-18 (×9): qty 1

## 2017-05-18 MED ORDER — PROPRANOLOL HCL 20 MG PO TABS
10.0000 mg | ORAL_TABLET | Freq: Three times a day (TID) | ORAL | Status: DC
  Administered 2017-05-18 – 2017-05-21 (×9): 10 mg via ORAL
  Filled 2017-05-18 (×9): qty 1

## 2017-05-18 NOTE — Plan of Care (Signed)
Denies SI/HI/AVH.    Labile. Upset because doctor informed her that she was going to be discharged.  Patient became belligerent and started yelling that she is not going home and started demanding to see a medical doctor because of her BP.  Refused am medications.  Attending groups.

## 2017-05-18 NOTE — [facility] Group Notes (Signed)
[facility] Group Notes:  (Nursing/MHT/Case Management/Adjunct)  Date:  05/18/2017  Time:  3:17 PM  Type of Therapy:  Psychoeducational Skills  Participation Level:  Minimal  Participation Quality:  Appropriate and left group for 20-30 min and came back  Affect:  Appropriate  Cognitive:  Appropriate  Insight:  Appropriate  Engagement in Group:  Engaged  Modes of Intervention:  Discussion, Education and Support  Summary of Progress/Problems:  [name] 05/18/2017, 3:17 PM

## 2017-05-18 NOTE — [facility] Group Notes (Signed)
LCSW Group Therapy Note   05/18/2017 9:30am   Type of Therapy and Topic:  Group Therapy:  Overcoming Obstacles   Participation Level:  Minimal   Description of Group:    In this group patients will be encouraged to explore what they see as obstacles to their own wellness and recovery. They will be guided to discuss their thoughts, feelings, and behaviors related to these obstacles. The group will process together ways to cope with barriers, with attention given to specific choices patients can make. Each patient will be challenged to identify changes they are motivated to make in order to overcome their obstacles. This group will be process-oriented, with patients participating in exploration of their own experiences as well as giving and receiving support and challenge from other group members.   Therapeutic Goals: 1. Patient will identify personal and current obstacles as they relate to admission. 2. Patient will identify barriers that currently interfere with their wellness or overcoming obstacles.  3. Patient will identify feelings, thought process and behaviors related to these barriers. 4. Patient will identify two changes they are willing to make to overcome these obstacles:      Summary of Patient Progress Pt came in middle of group.  She shares that she has had a rough start to her morning and wants a drink really bad. Group discussed craving with her.  CSW processed emotions with her and asked her to consider her addictions role in her behavior and recent behavior.  Talked about how this behavior produced additional stress and led her to feel like she now had an "reason" to drink.     Therapeutic Modalities:   Cognitive Behavioral Therapy Solution Focused Therapy Motivational Interviewing Relapse Prevention Therapy  August Saucer, LCSW 05/18/2017 4:45 PM

## 2017-05-18 NOTE — Progress Notes (Signed)
Referral for Home Health sent to [facility] with [facility].

## 2017-05-18 NOTE — Progress Notes (Signed)
Recreation Therapy Notes   Date: 11.26.18    Time: 1:00pm    Location: Craft Room    Behavioral response: Not Engaged   Intervention Topic: Values    Discussion/Intervention:  Group content today was focused on values. The group identified what values are and where they come from. Individuals expressed some values and how many they have. Patients described how they go about add or removing values. The group described the importance of having values and how they go about using them in daily life. Patient participated in the intervention "My Values" where they were able to pick out values that were important to them and make a visual aide.    Clinical Observations/Feedback:    Patient came to group and was focused on the topic at hand. She was pulled from group to talk to other disciplines. Individual returned to group and express she did not want to participated in the intervention. Patient watched her peers participate in group.   Lamia Mariner LRT/CTRS          Arleene Settle 05/18/2017 1:53 PM

## 2017-05-18 NOTE — Progress Notes (Signed)
Physical Therapy Treatment Patient Details Name: [name] MRN: [id number] DOB: 1955-11-21 Today's Date: 05/18/2017    History of Present Illness Pt is a 61 y/o F who was admitted to [place] [facility] from main [name] due to depression and alcoholism.  Pt with low sodium upon initial admission and was treated for this on the floor before transferred to [facility].  Pt on CIWA protocol.  Pt's PMH includes cancer, insomnia, neuropathy, L arm amputation above elbow, lumbar laminectomy, Bil THA.    PT Comments    [name] was limited by LLE pain and fatigue today; although agreeable and motivated to work with PT.  She ambulated in hallway with R lofstrand crutch and demonstrated instability with high level balance exercises while ambulating.  Pt completed therapeutic exercises in sitting.  SNF remains most appropriate d/c plan at this time.  If pt returns home instead of SNF, recommend HHPT and R lofstrand crutch.  Pt will benefit from continued skilled PT services to increase functional independence and safety.   Follow Up Recommendations  SNF     Equipment Recommendations  Other (comment)(R loftstrand crutch)    Recommendations for Other Services       Precautions / Restrictions Precautions Precautions: Fall Restrictions Weight Bearing Restrictions: No    Mobility  Bed Mobility               General bed mobility comments: Pt sitting in WC at start and end of session  Transfers Overall transfer level: Needs assistance Equipment used: Lofstrands Transfers: Sit to/from Stand Sit to Stand: Min guard         General transfer comment: Close min guard as pt demonstrates mild instability with transfer.   Ambulation/Gait Ambulation/Gait assistance: Min guard Ambulation Distance (Feet): 70 Feet Assistive device: Lofstrands Gait Pattern/deviations: Step-through pattern;Decreased stride length Gait velocity: decreased Gait velocity interpretation: Below  normal speed for age/gender General Gait Details: Pt with decreased gait speed and guarded posture as pt anticipating LLE pain.  Pt demonstrated instability with higher level balance activities as documented below.  Pt required one standing rest break due to LLE pain.    Stairs            Wheelchair Mobility    Modified Rankin (Stroke Patients Only)       Balance Overall balance assessment: Needs assistance;History of Falls Sitting-balance support: No upper extremity supported;Feet supported Sitting balance-Leahy Scale: Good     Standing balance support: No upper extremity supported;During functional activity Standing balance-Leahy Scale: Fair Standing balance comment: Pt able to stand statically without UE support but relies on UE support for dynamic activities             High level balance activites: Backward walking;Direction changes;Turns;Sudden stops;Head turns High Level Balance Comments: Pt demonstrates instability but no LOB with all high level balance activities documented above            Cognition Arousal/Alertness: Awake/alert Behavior During Therapy: WFL for tasks assessed/performed Overall Cognitive Status: Within Functional Limits for tasks assessed                                        Exercises General Exercises - Lower Extremity Long Arc Quad: Both;Seated;10 reps;Strengthening(with mod manual resistance on RLE) Hip ABduction/ADduction: Other (comment);Strengthening;Both;10 reps;Seated(isometric hip abd using belt) Other Exercises Other Exercises: Encouraged pt to continue using BLEs to self propel WC  in hallway moving both forward and back to challenge different muscle groups.   Other Exercises: Bil hip adductor pillow squeezes x15 in sitting    General Comments        Pertinent Vitals/Pain Pain Assessment: Faces Faces Pain Scale: Hurts even more Pain Location: LLE Pain Descriptors / Indicators:  Aching;Discomfort;Grimacing Pain Intervention(s): Limited activity within patient's tolerance;Monitored during session    Home Living                      Prior Function            PT Goals (current goals can now be found in the care plan section) Acute Rehab PT Goals Patient Stated Goal: to get stronger and more independent PT Goal Formulation: With patient Time For Goal Achievement: 05/26/17 Potential to Achieve Goals: Good Progress towards PT goals: Progressing toward goals    Frequency    Min 2X/week      PT Plan Current plan remains appropriate    Co-evaluation              AM-PAC PT "6 Clicks" Daily Activity  Outcome Measure  Difficulty turning over in bed (including adjusting bedclothes, sheets and blankets)?: A Little Difficulty moving from lying on back to sitting on the side of the bed? : A Little Difficulty sitting down on and standing up from a chair with arms (e.g., wheelchair, bedside commode, etc,.)?: A Little Help needed moving to and from a bed to chair (including a wheelchair)?: A Little Help needed walking in hospital room?: A Little Help needed climbing 3-5 steps with a railing? : A Little 6 Click Score: 18    End of Session Equipment Utilized During Treatment: Gait belt Activity Tolerance: Patient tolerated treatment well;Patient limited by fatigue;Patient limited by pain Patient left: in chair;Other (comment)(in WC) Nurse Communication: Mobility status PT Visit Diagnosis: Muscle weakness (generalized) (M62.81);Unsteadiness on feet (R26.81);Other abnormalities of gait and mobility (R26.89);History of falling (Z91.81)     Time: 8416-6063 PT Time Calculation (min) (ACUTE ONLY): 21 min  Charges:  $Therapeutic Exercise: 8-22 mins                    G Codes:       Collie Siad PT, DPT 05/18/2017, 2:56 PM

## 2017-05-18 NOTE — Progress Notes (Signed)
Patient becomes upset because she thinks that other patient's are talking about her.  She looks at one patient and states that she does not f--- with him and then looks at another patient and and calls her a B----.  Patient taken to community and door clothes.  Dr. Bary Leriche aware and orders given.  When this writer talk to patient, patient upset and crying and apologetic.  Medication given for headache and anxiety and asked patient to return to her room and relax and allow to take affect.  Patient complied.

## 2017-05-18 NOTE — Tx Team (Signed)
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 acetaminophen (TYLENOL) tablet 650 mg  650 mg Oral Q6H PRN Clapacs, [name], MD   650 mg at 05/12/17 0453  . alum & mag hydroxide-simeth (MAALOX/MYLANTA) 200-200-20 MG/5ML suspension 30 mL  30 mL Oral Q4H PRN Clapacs, [name], MD      . bisacodyl (DULCOLAX) suppository 10 mg  10 mg Rectal Daily PRN Ramond Dial, MD      . docusate sodium (COLACE) capsule 200 mg  200 mg Oral BID Pucilowska, [name], MD   200 mg at 05/17/17 1655  . folic acid (FOLVITE) tablet 1 mg  1 mg Oral Daily Clapacs, [name], MD   1 mg at 05/17/17 0834  . gabapentin (NEURONTIN) capsule 900 mg  900 mg Oral TID Clapacs, [name], MD   900 mg at 05/17/17 1655  . magnesium hydroxide (MILK OF MAGNESIA) suspension 30 mL  30 mL Oral Daily PRN Clapacs, [name], MD   30 mL at 05/13/17 0825  . multivitamin with minerals tablet 1 tablet  1 tablet Oral Daily Clapacs, [name], MD   1 tablet at 05/17/17 [phone]  . nicotine (NICODERM CQ - dosed in mg/24 hours) patch 21 mg  21 mg Transdermal Daily McNew, [name], MD   21 mg at 05/17/17 0836  . pantoprazole (PROTONIX) EC tablet 40 mg  40 mg Oral Daily Pucilowska, [name], MD   40 mg at 05/17/17 0835  . senna-docusate (Senokot-S) tablet 1 tablet  1 tablet Oral BID Clapacs, [name], MD   1 tablet at 05/17/17 1655  . thiamine (VITAMIN B-1) tablet 100 mg  100 mg Oral Daily  Clapacs, [name], MD   100 mg at 05/17/17 0835  . venlafaxine XR (EFFEXOR-XR) 24 hr capsule 150 mg  150 mg Oral Q breakfast Clapacs, [name], MD   150 mg at 05/17/17 9563    PTA Medications: Medications Prior to Admission  Medication Sig Dispense Refill Last Dose  . azithromycin (ZITHROMAX) 250 MG tablet Take 2 tablets p.o. x1 today followed by 1 tablet once daily for 4 days starting from tomorrow 6 each 0   . folic acid (FOLVITE) 1 MG tablet Take 1 tablet (1 mg total) daily by mouth.     . gabapentin (NEURONTIN) 300 MG capsule Take 3 capsules (900 mg total) by mouth 3 (three) times daily. 270 capsule 0 05/08/2017 at 0800  . LORazepam (ATIVAN) 2 MG tablet Take 1 tablet (2 mg total) every 4 (four) hours as needed by mouth (CIWA-AR > 8  -OR-  withdrawal symptoms:  anxiety, agitation, insomnia, diaphoresis, nausea, vomiting, tremors, tachycardia, or hypertension.). 30 tablet 0   . Multiple Vitamin (MULTIVITAMIN WITH MINERALS) TABS tablet Take 1 tablet daily by mouth.     . phenol-menthol (CEPASTAT) 14.5 MG lozenge Place 1 lozenge 4 (four) times daily as needed inside cheek for sore throat. 100 tablet 0   . propranolol (INDERAL) 10 MG tablet Take 1 tablet (  10 mg total) by mouth 3 (three) times daily. 90 tablet 0 05/08/2017 at 0700  . senna-docusate (SENOKOT-S) 8.6-50 MG tablet Take 1 tablet 2 (two) times daily as needed by mouth for mild constipation.     . thiamine 100 MG tablet Take 1 tablet (100 mg total) daily by mouth.     . traZODone (DESYREL) 100 MG tablet Take 2 tablets (200 mg total) by mouth at bedtime. (Patient taking differently: Take 300 mg at bedtime by mouth. ) 60 tablet 0 05/07/2017 at 2000  . venlafaxine XR (EFFEXOR-XR) 150 MG 24 hr capsule Take 1 capsule (150 mg total) by mouth daily with breakfast. 30 capsule 0 05/08/2017 at 0700    Patient Stressors:    Patient Strengths:    Treatment Modalities: Medication Management, Group therapy, Case management,  1 to 1 session with  clinician, Psychoeducation, Recreational therapy.   Physician Treatment Plan for Primary Diagnosis: Severe recurrent major depression without psychotic features ([place]) Long Term Goal(s): Improvement in symptoms so as ready for discharge  Short Term Goals: Ability to identify changes in lifestyle to reduce recurrence of condition will improve Ability to verbalize feelings will improve Ability to disclose and discuss suicidal ideas Ability to demonstrate self-control will improve Ability to identify and develop effective coping behaviors will improve Ability to identify triggers associated with substance abuse/mental health issues will improve Ability to identify changes in lifestyle to reduce recurrence of condition will improve Ability to demonstrate self-control will improve Ability to identify triggers associated with substance abuse/mental health issues will improve  Medication Management: Evaluate patient's response, side effects, and tolerance of medication regimen.  Therapeutic Interventions: 1 to 1 sessions, Unit Group sessions and Medication administration.  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)   Alcohol use disorder, severe, dependence ([place])   Tobacco use disorder   Long Term Goal(s): Improvement in symptoms so as ready for discharge  Short Term Goals: Ability to identify changes in lifestyle to reduce recurrence of condition will improve Ability to verbalize feelings will improve Ability to disclose and discuss suicidal ideas Ability to demonstrate self-control will improve Ability to identify and develop effective coping behaviors will improve Ability to identify triggers associated with substance abuse/mental health issues will  improve Ability to identify changes in lifestyle to reduce recurrence of condition will improve Ability to demonstrate self-control will improve Ability to identify triggers associated with substance abuse/mental health issues will improve  Medication Management: Evaluate patient's response, side effects, and tolerance of medication regimen.  Therapeutic Interventions: 1 to 1 sessions, Unit Group sessions and Medication administration.  Evaluation of Outcomes: Progressing   RN Treatment Plan for Primary Diagnosis: Severe recurrent major depression without psychotic features ([place]) Long Term Goal(s): Knowledge of disease and therapeutic regimen to maintain health will improve  Short Term Goals: Ability to participate in decision making will improve, Ability to identify and develop effective coping behaviors will improve and Compliance with prescribed medications will improve  Medication Management: RN will administer medications as ordered by provider, will assess and evaluate patient's response and provide education to patient for prescribed medication. RN will report any adverse and/or side effects to prescribing provider.  Therapeutic Interventions: 1 on 1 counseling sessions, Psychoeducation, Medication administration, Evaluate responses to treatment, Monitor vital signs and CBGs as ordered, Perform/monitor CIWA, COWS,  AIMS and Fall Risk screenings as ordered, Perform wound care treatments as ordered.  Evaluation of Outcomes: Progressing   LCSW Treatment Plan for Primary Diagnosis: Severe recurrent major depression without psychotic features ([place]) Long Term Goal(s): Safe transition to appropriate next level of care at discharge, Engage patient in therapeutic group addressing interpersonal concerns.  Short Term Goals: Engage patient in aftercare planning with referrals and resources  Therapeutic Interventions: Assess for all discharge needs, 1 to 1 time with Social worker, Explore  available resources and support systems, Assess for adequacy in community support network, Educate family and significant other(s) on suicide prevention, Complete Psychosocial Assessment, Interpersonal group therapy.  Evaluation of Outcomes: Progressing   Progress in Treatment: Attending groups: Yes Participating in groups: Yes Taking medication as prescribed: Yes Toleration medication: Yes, no side effects reported at this time Family/Significant other contact made: Husband Patient understands diagnosis: Yes AEB need for medication to manage symptoms Discussing patient identified problems/goals with staff: Yes Medical problems stabilized or resolved: Yes Denies suicidal/homicidal ideation: Yes Issues/concerns per patient self-inventory: None Other: N/A  New problem(s) identified: None identified at this time.   New Short Term/Long Term Goal(s): "I need to stay here to get my medications together".   Discharge Plan or Barriers: Return home with husband and attend [facility] on Friday.  Reason for Continuation of Hospitalization: Medication stabilization   Estimated Length of Stay: 1-3 days  Attendees: Patient:[name] 05/18/2017  11:00 AM Physician:Jolanta Pucilowaska, MD         05/18/2017  11:00 AM Nursing: Elige Radon, RN            05/18/2017  11:00 AM RN Care Manager:     05/18/2017  11:00 AM Social Worker: Darin Engels LCSWA           05/18/2017  11:00 AM Recreational Therapist:        05/18/2017  11:00 AM Other:      05/18/2017  11:00 AM Other:   05/18/2017  11:00 AM   Scribe for Treatment Team: Darin Engels LCSWA 05/18/2017 11:00 AM

## 2017-05-18 NOTE — Progress Notes (Signed)
[facility] MD Progress Note  05/18/2017 3:42 PM JAQUILLA WOODROOF  MRN:  [id number]  Subjective:   Ms. Patella is extremely labile and easily agitated today. She has been worried about her low blood pressure and has been refusing medications. She has not gotten Propranolol recently and feels that her anxiety is out of control.  I met with the patient and the Unit Director to address her complaints. Patient rather disorganized and very touchy.   Treatment plan. We did not make any medication adjustments since admission and continued all her medications as in the community. She now demands Propranolol. Nurses will recheck her BP before we restart it.  Social/disposition. She will be discharged to home. She refuses SNF. She will follow up with [facility].  Principal Problem: Severe recurrent major depression without psychotic features ([place]) Diagnosis:   Patient Active Problem List   Diagnosis Date Noted  . High anion gap metabolic acidosis [G25.4] 05/08/2017  . Overdose of antidepressant [T43.201A] 07/03/2016  . 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 Severe recurrent major depression without psychotic features ([place]) [F33.2] 07/03/2015  . Alcohol use disorder, severe, dependence ([place]) [F10.20] 01/26/2015  . Tobacco use disorder [F17.200] 01/26/2015  . Alcohol withdrawal ([place]) [F10.239] 01/08/2015  . 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 GAD (generalized anxiety disorder) [F41.1] 12/24/2013  . PTSD (post-traumatic stress disorder) [F43.10] 12/22/2013   Total Time spent with patient: 30 minutes  Past Psychiatric History: depression, alcoholism.  Past Medical History:  Past Medical History:  Diagnosis Date  . Anxiety   . Anxiety    panic anxiety disorder  . Arthritis   . Asthma    chronic asthmatic bronchitis  . Cancer [facility])    Desmoid tumor left forearm  . Depression   . ETOH abuse   . Heart murmur   . Insomnia   . Neuropathy     Past  Surgical History:  Procedure Laterality Date  . ABDOMINAL HYSTERECTOMY    . ABDOMINAL HYSTERECTOMY  2007  . 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 ARM AMPUTATION AT SHOULDER Left   . CESAREAN SECTION    . [place]   x 2  . JOINT REPLACEMENT Right 2012   hip  . LUMBAR LAMINECTOMY/DECOMPRESSION MICRODISCECTOMY Left 04/16/2016   Procedure: LUMBAR LAMINECTOMY/DECOMPRESSION MICRODISCECTOMY 1 LEVEL;  Surgeon: Meade Maw, MD;  Location: [facility] ORS;  Service: Neurosurgery;  Laterality: Left;  Left L4-5 far lateral discectomy, left L4-5 laminoforaminotomy  . TOTAL HIP ARTHROPLASTY Left 09/17/2015   Procedure: TOTAL HIP ARTHROPLASTY;  Surgeon: Dereck Leep, MD;  Location: [facility] ORS;  Service: Orthopedics;  Laterality: Left;   Family History:  Family History  Problem Relation Age of Onset  . COPD Mother    Family Psychiatric  History: depression Social History:  Social History   Substance and Sexual Activity  Alcohol Use Yes   Comment: 2 40oz beers daily     Social History   Substance and Sexual Activity  Drug Use No   Comment: Patient denies     Social History   Socioeconomic History  . Marital status: Divorced    Spouse name: None  . Number of children: None  . Years of education: None  . Highest education level: None  Social Needs  . Financial resource strain: None  . Food insecurity - worry: None  . Food insecurity - inability: None  . Transportation needs - medical:  None  . Transportation needs - non-medical: None  Occupational History  . None  Tobacco Use  . Smoking status: Current Every Day Smoker    Packs/day: 0.25    Types: Cigarettes  . Smokeless tobacco: Never Used  Substance and Sexual Activity  . Alcohol use: Yes    Comment: 2 40oz beers daily  . Drug use: No    Comment: Patient denies   . Sexual activity: Yes  Other Topics Concern  . None  Social History Narrative   ** Merged History Encounter **        Additional Social History:    History of alcohol / drug use?: Yes                    Sleep: Fair  Appetite:  Fair  Current Medications: Current Facility-Administered Medications  Medication Dose Route Frequency Provider Last Rate Last Dose  . acetaminophen (TYLENOL) tablet 650 mg  650 mg Oral Q6H PRN Clapacs, [name], MD   650 mg at 05/12/17 0453  . alum & mag hydroxide-simeth (MAALOX/MYLANTA) 200-200-20 MG/5ML suspension 30 mL  30 mL Oral Q4H PRN Clapacs, [name], MD      . bisacodyl (DULCOLAX) suppository 10 mg  10 mg Rectal Daily PRN Ramond Dial, MD      . docusate sodium (COLACE) capsule 200 mg  200 mg Oral BID Mikyle Sox B, MD   200 mg at 05/18/17 1401  . folic acid (FOLVITE) tablet 1 mg  1 mg Oral Daily Clapacs, [name], MD   1 mg at 05/18/17 1400  . gabapentin (NEURONTIN) capsule 900 mg  900 mg Oral TID Clapacs, [name], MD   900 mg at 05/18/17 1400  . magnesium hydroxide (MILK OF MAGNESIA) suspension 30 mL  30 mL Oral Daily PRN Clapacs, [name], MD   30 mL at 05/13/17 0825  . multivitamin with minerals tablet 1 tablet  1 tablet Oral Daily Clapacs, [name], MD   1 tablet at 05/18/17 1359  . nicotine (NICODERM CQ - dosed in mg/24 hours) patch 21 mg  21 mg Transdermal Daily McNew, [name], MD   21 mg at 05/17/17 0836  . pantoprazole (PROTONIX) EC tablet 40 mg  40 mg Oral Daily Brennan Karam B, MD   40 mg at 05/18/17 1401  . senna-docusate (Senokot-S) tablet 1 tablet  1 tablet Oral BID Clapacs, [name], MD   1 tablet at 05/18/17 1401  . thiamine (VITAMIN B-1) tablet 100 mg  100 mg Oral Daily Clapacs, [name], MD   100 mg at 05/18/17 1400  . venlafaxine XR (EFFEXOR-XR) 24 hr capsule 150 mg  150 mg Oral Q breakfast Clapacs, [name], MD   150 mg at 05/18/17 1402    Lab Results: No results found for this or any previous visit (from the past 48 hour(s)).  Blood Alcohol level:  Lab Results  Component Value Date   ETH <10 05/10/2017   ETH 189 (H) 18/29/9371     Metabolic Disorder Labs: Lab Results  Component Value Date   HGBA1C 5.0 05/12/2017   MPG 96.8 05/12/2017   MPG 97 07/05/2016   No results found for: PROLACTIN Lab Results  Component Value Date   CHOL 225 (H) 05/12/2017   TRIG 185 (H) 05/12/2017   HDL 58 05/12/2017   CHOLHDL 3.9 05/12/2017   VLDL 37 05/12/2017   LDLCALC 130 (H) 05/12/2017   LDLCALC 136 (H) 07/05/2016    Physical Findings:  AIMS: Facial and Oral Movements Muscles of Facial Expression: None, normal Lips and Perioral Area: None, normal Jaw: None, normal Tongue: None, normal,Extremity Movements Upper (arms, wrists, hands, fingers): None, normal Lower (legs, knees, ankles, toes): None, normal, Trunk Movements Neck, shoulders, hips: None, normal, Overall Severity Severity of abnormal movements (highest score from questions above): None, normal Incapacitation due to abnormal movements: None, normal Patient's awareness of abnormal movements (rate only patient's report): No Awareness, Dental Status Current problems with teeth and/or dentures?: No Does patient usually wear dentures?: No  CIWA:  CIWA-Ar Total: 1 COWS:  COWS Total Score: 3  Musculoskeletal: Strength & Muscle Tone: decreased Gait & Station: unsteady Patient leans: N/A  Psychiatric Specialty Exam: Physical Exam  Nursing note and vitals reviewed. Psychiatric: Her mood appears anxious. Her affect is angry, labile and inappropriate. Her speech is rapid and/or pressured. She is hyperactive. Thought content is paranoid. Cognition and memory are normal. She expresses impulsivity.    Review of Systems  Neurological: Positive for dizziness.  Psychiatric/Behavioral: Positive for substance abuse. The patient is nervous/anxious.   All other systems reviewed and are negative.   Blood pressure 129/63, pulse 72, temperature 98.4 F (36.9 C), temperature source Oral, resp. rate 16, height '5\' 5"'$  (1.651 m), weight 64.4 kg (142 lb), SpO2 99 %.Body mass index  is 23.63 kg/m.  General Appearance: Casual  Eye Contact:  Good  Speech:  Pressured  Volume:  Increased  Mood:  Angry and Irritable  Affect:  Congruent  Thought Process:  Goal Directed and Descriptions of Associations: Intact  Orientation:  Full (Time, Place, and Person)  Thought Content:  WDL  Suicidal Thoughts:  No  Homicidal Thoughts:  No  Memory:  Immediate;   Fair Recent;   Fair Remote;   Fair  Judgement:  Poor  Insight:  Lacking  Psychomotor Activity:  Increased  Concentration:  Concentration: Fair and Attention Span: Fair  Recall:  [employer] of Knowledge:  Fair  Language:  Fair  Akathisia:  No  Handed:  Right  AIMS (if indicated):     Assets:  Communication Skills Desire for Improvement Financial Resources/Insurance Housing Resilience Social Support  ADL's:  Intact  Cognition:  WNL  Sleep:  Number of Hours: 7     Treatment Plan Summary: Daily contact with patient to assess and evaluate symptoms and progress in treatment and Medication management   Ms. Branford is a 61 year old female with a history of depression and alcoholism transferred from medical floor where she was briefly hospitalized for alcohol detox.  She continues to do well from a mood disorder standpoint and has successfully detoxed from alcohol.  Following the loose stools she has had low blood pressures including some degree of postural hypotension.  We will continue to monitor for this.  Denies suicidal ideation or thoughts about dying.  #Depression -continue Effexor 150 mg daily -Takes propranolol 10 mg 3 times daily for anxiety symptoms.  Endorses that hydroxyzine does not work for her.  Has not been able to take the propranolol because of low blood pressures.  #Chronic pain -Continue Neurontin 900 mg TID  #Insomnia -discontinue Trazodone due to low BP  #Alcohol withdrawal -Librium taper is complete. -refuses residential treatment of pharmacotherapy for alcoholism -open to SA  IOP  #GERD -Protonix 40 mg daily  # URI -completed a course of azithromycin  #Smoking -Nicotine patch is available  #Hypotension possibly associated with a viral infection. -Monitor blood pressures -Advised adequate hydration  #Falls -PT consult is appreciated -  SNF recommended after discharge but the patient refuses  #Disposition -discharge to home with home health -follow up with [facility] for medication management and Lasandra Beech, MD 05/18/2017, 3:42 PM

## 2017-05-18 NOTE — Progress Notes (Signed)
Vital signs taken.  Once completed.  Asked patient if she was ready for her medications.  Patient did not respond just returned to her room.

## 2017-05-18 NOTE — Progress Notes (Signed)
Patient ID: [name], female   DOB: 1955-12-09, 61 y.o.   MRN: [id number]  DAR Note  Pt observed in the dayroom interacting with peers. Pt at the time of assessment moderate anxiety and irritability; "the lower part of my b/p has been low and I'm worried; I need some answers." Pt denied depression, SI, HI, pain or AVH. Support, encouragement, and safe environment provided.  15-minute safety checks continue. All Pt's questions and concerns addressed. Pt was med compliant. Remained calm and cooperative. Pt was med compliant.

## 2017-05-18 NOTE — Progress Notes (Signed)
Dr. Informed patient that she was going to be discharged today. Patient became upset and starting yelling and demanding to see a medical doctor to address her BP.  Patient then started yelling that she was not leaving today and starting yelling and demanding her medication.  This Probation officer asked patient not to yell and patient states that she could do and say what she wanted to.  Then stated that she refused her medications because she had the right to.

## 2017-05-18 NOTE — Plan of Care (Signed)
  Progressing Education: Knowledge of disease or condition will improve 05/18/2017 1105 - Progressing by Rise Mu, RN Understanding of discharge needs will improve 05/18/2017 1105 - Progressing by Rise Mu, RN Health Behavior/Discharge Planning: Ability to identify changes in lifestyle to reduce recurrence of condition will improve 05/18/2017 1105 - Progressing by Rise Mu, RN Identification of resources available to assist in meeting health care needs will improve 05/18/2017 1105 - Progressing by Rise Mu, RN Safety: Ability to remain free from injury will improve 05/18/2017 1105 - Progressing by Rise Mu, RN [facility] Participation in Recreation Therapeutic Interventions [facility]-Other Recreation Therapy Goal (Specify) Description Patient will identify 3 positive coping strategies to use post discharge x5 days.  05/18/2017 1105 - Progressing by Rise Mu, RN Safety: Ability to remain free from injury will improve 05/18/2017 1105 - Progressing by Rise Mu, RN Activity: Sleeping patterns will improve 05/18/2017 1105 - Progressing by Rise Mu, RN

## 2017-05-19 MED ORDER — PROCHLORPERAZINE MALEATE 10 MG PO TABS
10.0000 mg | ORAL_TABLET | Freq: Four times a day (QID) | ORAL | Status: DC | PRN
  Administered 2017-05-19 – 2017-05-21 (×4): 10 mg via ORAL
  Filled 2017-05-19 (×6): qty 1

## 2017-05-19 MED ORDER — ASENAPINE MALEATE 5 MG SL SUBL
10.0000 mg | SUBLINGUAL_TABLET | Freq: Every day | SUBLINGUAL | Status: DC
  Administered 2017-05-19 – 2017-05-20 (×2): 10 mg via SUBLINGUAL
  Filled 2017-05-19 (×2): qty 2

## 2017-05-19 NOTE — Progress Notes (Signed)
Physical Therapy Treatment Patient Details Name: [name] MRN: [id number] DOB: [birth year] Today's Date: 05/19/2017    History of Present Illness Pt is a 61 y/o F who was admitted to [place] [facility] from main [facility] due to depression and alcoholism.  Pt with low sodium upon initial admission and was treated for this on the floor before transferred to [facility].  Pt on CIWA protocol.  Pt's PMH includes cancer, insomnia, neuropathy, L arm amputation above elbow, lumbar laminectomy, Bil THA.    PT Comments    Pt ambulated 200' with right forearm crutch and min guard.  No LOB or buckling noted.  Pt stated she feels comfortable with crutch and thinks it will serve her well at home.  Overall increased mobility skills but she reports she self limits due to back pain and pending lumbar surgery early next year.  Pt will benefit from right forearm crutch upon discharge.   Follow Up Recommendations  Home health PT     Equipment Recommendations  Other (comment) see above   Recommendations for Other Services       Precautions / Restrictions Precautions Precautions: Fall Restrictions Weight Bearing Restrictions: No    Mobility  Bed Mobility                  Transfers       Sit to Stand: Min guard            Ambulation/Gait Ambulation/Gait assistance: Min guard Ambulation Distance (Feet): 200 Feet Assistive device: Lofstrands Gait Pattern/deviations: Step-through pattern         Stairs            Wheelchair Mobility    Modified Rankin (Stroke Patients Only)       Balance Overall balance assessment: Needs assistance;History of Falls Sitting-balance support: No upper extremity supported;Feet supported Sitting balance-Leahy Scale: Good     Standing balance support: Single extremity supported Standing balance-Leahy Scale: Fair                              Cognition Arousal/Alertness: Awake/alert Behavior During  Therapy: WFL for tasks assessed/performed Overall Cognitive Status: Within Functional Limits for tasks assessed                                        Exercises      General Comments        Pertinent Vitals/Pain Pain Assessment: 0-10 Pain Score: 4  Pain Location: back Pain Descriptors / Indicators: Aching Pain Intervention(s): Limited activity within patient's tolerance    Home Living                      Prior Function            PT Goals (current goals can now be found in the care plan section) Progress towards PT goals: Progressing toward goals    Frequency    Min 2X/week      PT Plan Current plan remains appropriate    Co-evaluation              AM-PAC PT "6 Clicks" Daily Activity  Outcome Measure  Difficulty turning over in bed (including adjusting bedclothes, sheets and blankets)?: A Little Difficulty moving from lying on back to sitting on the side of the bed? : A Little Difficulty  sitting down on and standing up from a chair with arms (e.g., wheelchair, bedside commode, etc,.)?: None Help needed moving to and from a bed to chair (including a wheelchair)?: None Help needed walking in hospital room?: A Little Help needed climbing 3-5 steps with a railing? : A Little 6 Click Score: 20    End of Session Equipment Utilized During Treatment: Gait belt Activity Tolerance: Patient tolerated treatment well Patient left: in chair   Pain - Right/Left: Left Pain - part of body: Leg     Time: 6415-8309 PT Time Calculation (min) (ACUTE ONLY): 9 min  Charges:  $Gait Training: 8-22 mins                    G Codes:       Chesley Noon, PTA 05/19/17, 3:48 PM

## 2017-05-19 NOTE — BHH Group Notes (Signed)
[place] Group Notes:  (Nursing/MHT/Case Management/Adjunct)  Date:  05/19/2017  Time:  9:26 PM  Type of Therapy:  Psychoeducational Skills  Participation Level:  Active  Participation Quality:  Appropriate and Attentive  Affect:  Appropriate  Cognitive:  Alert and Appropriate  Insight:  Appropriate  Engagement in Group:  Engaged  Modes of Intervention:  Discussion and Education  Summary of Progress/Problems:  Alois Cliche 05/19/2017, 9:26 PM

## 2017-05-19 NOTE — BHH Group Notes (Signed)
05/19/2017 9:30am  Type of Therapy/Topic:  Group Therapy:  Feelings about Diagnosis  Participation Level:  Active   Description of Group:   This group will allow patients to explore their thoughts and feelings about diagnoses they have received. Patients will be guided to explore their level of understanding and acceptance of these diagnoses. Facilitator will encourage patients to process their thoughts and feelings about the reactions of others to their diagnosis and will guide patients in identifying ways to discuss their diagnosis with significant others in their lives. This group will be process-oriented, with patients participating in exploration of their own experiences, giving and receiving support, and processing challenge from other group members.   Therapeutic Goals: 1. Patient will demonstrate understanding of diagnosis as evidenced by identifying two or more symptoms of the disorder 2. Patient will be able to express two feelings regarding the diagnosis 3. Patient will demonstrate their ability to communicate their needs through discussion and/or role play  Summary of Patient Progress:  Stayed the entire time, actively participated. [name] checked in feeling "much better because of the new medication they put me on". She was able to identify her symptoms and feelings in relation to her diagnosis. She states that she is angry but has an acceptance that she has this diagnosis and understands that she will need to take medication to help her manage the symptoms for the rest of her life. She also had great insight into how life stressors can lead her into her depression which results in her alcohol abuse as a means to block out the feelings. She reports having several tools that can assist her in managing her depression such as attending meetings, meeting with her sponsor, using her support system, meeting with her providers and taking her medications as prescribed. Mood was good.       Therapeutic Modalities:   Cognitive Behavioral Therapy Brief Therapy Feelings Identification    Darin Engels, LCSW 05/19/2017 10:42 AM

## 2017-05-19 NOTE — Progress Notes (Signed)
Recreation Therapy Notes          Devyne Hauger 05/19/2017 4:47 PM

## 2017-05-19 NOTE — Progress Notes (Signed)
[facility] MD Progress Note  05/19/2017 6:50 PM [name]  MRN:  [id number]  Subjective:   [name] is cool and collected today. She is apologetic for yesterday's behavior. She accepts medications and tolerates them well. BP is slightly elevated. She slept well last night.  Treatment plan. We will continue Effexor, Depakote and Saphris for depression and mood stabilization.  Social/disposition. She will be discharged to home with her husband. She will follow up with [facility].   Principal Problem: Severe recurrent major depression without psychotic features ([place]) Diagnosis:   Patient Active Problem List   Diagnosis Date Noted  . High anion gap metabolic acidosis [N27.7] 05/08/2017  . Overdose of antidepressant [T43.201A] 07/03/2016  . 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 Severe recurrent major depression without psychotic features ([place]) [F33.2] 07/03/2015  . Alcohol use disorder, severe, dependence ([place]) [F10.20] 01/26/2015  . Tobacco use disorder [F17.200] 01/26/2015  . Alcohol withdrawal ([place]) [F10.239] 01/08/2015  . 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 GAD (generalized anxiety disorder) [F41.1] 12/24/2013  . PTSD (post-traumatic stress disorder) [F43.10] 12/22/2013   Total Time spent with patient: 20 minutes  Past Psychiatric History: depression, mood instability, alcoholism  Past Medical History:  Past Medical History:  Diagnosis Date  . Anxiety   . Anxiety    panic anxiety disorder  . Arthritis   . Asthma    chronic asthmatic bronchitis  . Cancer [facility])    Desmoid tumor left forearm  . Depression   . ETOH abuse   . Heart murmur   . Insomnia   . Neuropathy     Past Surgical History:  Procedure Laterality Date  . ABDOMINAL HYSTERECTOMY    . ABDOMINAL HYSTERECTOMY  2007  . 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 ARM AMPUTATION AT SHOULDER Left   . CESAREAN SECTION    . [place]   x 2  . JOINT REPLACEMENT Right 2012   hip  . LUMBAR LAMINECTOMY/DECOMPRESSION MICRODISCECTOMY Left 04/16/2016   Procedure: LUMBAR LAMINECTOMY/DECOMPRESSION MICRODISCECTOMY 1 LEVEL;  Surgeon: Meade Maw, MD;  Location: [facility] ORS;  Service: Neurosurgery;  Laterality: Left;  Left L4-5 far lateral discectomy, left L4-5 laminoforaminotomy  . TOTAL HIP ARTHROPLASTY Left 09/17/2015   Procedure: TOTAL HIP ARTHROPLASTY;  Surgeon: Dereck Leep, MD;  Location: [facility] ORS;  Service: Orthopedics;  Laterality: Left;   Family History:  Family History  Problem Relation Age of Onset  . COPD Mother    Family Psychiatric  History: depression Social History:  Social History   Substance and Sexual Activity  Alcohol Use Yes   Comment: 2 40oz beers daily     Social History   Substance and Sexual Activity  Drug Use No   Comment: Patient denies     Social History   Socioeconomic History  . Marital status: Divorced    Spouse name: None  . Number of children: None  . Years of education: None  . Highest education level: None  Social Needs  . Financial resource strain: None  . Food insecurity - worry: None  . Food insecurity - inability: None  . Transportation needs - medical: None  . Transportation needs - non-medical: None  Occupational History  . None  Tobacco Use  . Smoking status: Current Every Day Smoker    Packs/day: 0.25    Types: Cigarettes  . Smokeless tobacco: Never Used  Substance and Sexual Activity  .  Alcohol use: Yes    Comment: 2 40oz beers daily  . Drug use: No    Comment: Patient denies   . Sexual activity: Yes  Other Topics Concern  . None  Social History Narrative   ** Merged History Encounter **       Additional Social History:    History of alcohol / drug use?: Yes                    Sleep: Fair  Appetite:  Fair  Current Medications: Current Facility-Administered Medications  Medication Dose Route Frequency Provider Last Rate Last  Dose  . acetaminophen (TYLENOL) tablet 650 mg  650 mg Oral Q6H PRN Clapacs, [name], MD   650 mg at 05/18/17 1733  . alum & mag hydroxide-simeth (MAALOX/MYLANTA) 200-200-20 MG/5ML suspension 30 mL  30 mL Oral Q4H PRN Clapacs, [name], MD      . asenapine (SAPHRIS) sublingual tablet 10 mg  10 mg Sublingual QHS Braxon Suder B, MD      . bisacodyl (DULCOLAX) suppository 10 mg  10 mg Rectal Daily PRN Ramond Dial, MD      . divalproex (DEPAKOTE) DR tablet 500 mg  500 mg Oral Q8H Alvah Gilder B, MD   500 mg at 05/19/17 1554  . docusate sodium (COLACE) capsule 200 mg  200 mg Oral BID Elzora Cullins B, MD   200 mg at 05/19/17 1810  . folic acid (FOLVITE) tablet 1 mg  1 mg Oral Daily Clapacs, [name], MD   1 mg at 05/19/17 0814  . gabapentin (NEURONTIN) capsule 900 mg  900 mg Oral TID Clapacs, [name], MD   900 mg at 05/19/17 1809  . magnesium hydroxide (MILK OF MAGNESIA) suspension 30 mL  30 mL Oral Daily PRN Clapacs, [name], MD   30 mL at 05/19/17 0816  . multivitamin with minerals tablet 1 tablet  1 tablet Oral Daily Clapacs, [name], MD   1 tablet at 05/19/17 0814  . nicotine (NICODERM CQ - dosed in mg/24 hours) patch 21 mg  21 mg Transdermal Daily McNew, [name], MD   21 mg at 05/19/17 0815  . pantoprazole (PROTONIX) EC tablet 40 mg  40 mg Oral Daily Darbi Chandran B, MD   40 mg at 05/19/17 0814  . prochlorperazine (COMPAZINE) tablet 10 mg  10 mg Oral Q6H PRN Katrin Grabel B, MD   10 mg at 05/19/17 1811  . propranolol (INDERAL) tablet 10 mg  10 mg Oral TID Regnia Mathwig B, MD   10 mg at 05/19/17 1810  . senna-docusate (Senokot-S) tablet 1 tablet  1 tablet Oral BID Clapacs, [name], MD   1 tablet at 05/19/17 1809  . temazepam (RESTORIL) capsule 15 mg  15 mg Oral QHS PRN Neeva Trew B, MD      . thiamine (VITAMIN B-1) tablet 100 mg  100 mg Oral Daily Clapacs, [name], MD   100 mg at 05/19/17 0814  . venlafaxine XR (EFFEXOR-XR) 24 hr capsule 150 mg  150 mg Oral  Q breakfast Clapacs, [name], MD   150 mg at 05/19/17 8502    Lab Results: No results found for this or any previous visit (from the past 48 hour(s)).  Blood Alcohol level:  Lab Results  Component Value Date   ETH <10 05/10/2017   ETH 189 (H) 77/41/2878    Metabolic Disorder Labs: Lab Results  Component Value Date   HGBA1C 5.0 05/12/2017  MPG 96.8 05/12/2017   MPG 97 07/05/2016   No results found for: PROLACTIN Lab Results  Component Value Date   CHOL 225 (H) 05/12/2017   TRIG 185 (H) 05/12/2017   HDL 58 05/12/2017   CHOLHDL 3.9 05/12/2017   VLDL 37 05/12/2017   LDLCALC 130 (H) 05/12/2017   LDLCALC 136 (H) 07/05/2016    Physical Findings: AIMS: Facial and Oral Movements Muscles of Facial Expression: None, normal Lips and Perioral Area: None, normal Jaw: None, normal Tongue: None, normal,Extremity Movements Upper (arms, wrists, hands, fingers): None, normal Lower (legs, knees, ankles, toes): None, normal, Trunk Movements Neck, shoulders, hips: None, normal, Overall Severity Severity of abnormal movements (highest score from questions above): None, normal Incapacitation due to abnormal movements: None, normal Patient's awareness of abnormal movements (rate only patient's report): No Awareness, Dental Status Current problems with teeth and/or dentures?: No Does patient usually wear dentures?: No  CIWA:  CIWA-Ar Total: 1 COWS:  COWS Total Score: 3  Musculoskeletal: Strength & Muscle Tone: decreased Gait & Station: unsteady Patient leans: N/A  Psychiatric Specialty Exam: Physical Exam  Nursing note and vitals reviewed. Psychiatric: Her speech is normal and behavior is normal. Thought content normal. Her affect is blunt. Cognition and memory are normal. She expresses impulsivity.    Review of Systems  Gastrointestinal: Positive for nausea.  Neurological: Negative.   Psychiatric/Behavioral: Negative.   All other systems reviewed and are negative.   Blood  pressure (!) 154/70, pulse 73, temperature 98 F (36.7 C), temperature source Oral, resp. rate 18, height 5\' 5"  (1.651 m), weight 64.4 kg (142 lb), SpO2 100 %.Body mass index is 23.63 kg/m.  General Appearance: Casual  Eye Contact:  Good  Speech:  Clear and Coherent  Volume:  Normal  Mood:  Euthymic  Affect:  Appropriate  Thought Process:  Goal Directed and Descriptions of Associations: Intact  Orientation:  Full (Time, Place, and Person)  Thought Content:  WDL  Suicidal Thoughts:  No  Homicidal Thoughts:  No  Memory:  Immediate;   Fair Recent;   Fair Remote;   Fair  Judgement:  Impaired  Insight:  Shallow  Psychomotor Activity:  Normal  Concentration:  Concentration: Fair and Attention Span: Fair  Recall:  [employer] of Knowledge:  Fair  Language:  Fair  Akathisia:  No  Handed:  Right  AIMS (if indicated):     Assets:  Communication Skills Desire for Improvement Financial Resources/Insurance Housing Resilience Social Support  ADL's:  Intact  Cognition:  WNL  Sleep:  Number of Hours: 8.15     Treatment Plan Summary: Daily contact with patient to assess and evaluate symptoms and progress in treatment and Medication management   Ms. Eble is a 61 year old female with a history of depression and alcoholism transferred from medical floor where she was briefly hospitalized for alcohol detox. She has had severe anxiety over the weekend that culminated in a "meltdown" yesterday. Symptoms are improving on Depakote and Saphris.  #Mood -continue Effexor 150 mg daily -continue Depakote 500 mg TID -continue Saphris 10 mg nightly  #Anxiety -continue Propranolol 10 mg TID, BP is normal today  #Chronic pain -Continue Neurontin 900 mg TID  #Insomnia -Trazodone discontinued due to low BP -Saphris 10 mg nightly  #Alcohol withdrawal -Librium taper is complete. -refuses residential treatment of pharmacotherapy for alcoholism -open to [facility] IOP  #GERD -Protonix 40 mg  daily -start Compazine for nausea  # URI -completed a course of azithromycin  #Smoking -Nicotine patch is  available  #Hypotension, resolved  #Falls -PT consult is appreciated -SNF recommended after discharge but the patient refuses  #Disposition -discharge to home with home health -follow up with [facility] for medication management and SAIOP    Orson Slick, MD 05/19/2017, 6:50 PM

## 2017-05-19 NOTE — Plan of Care (Signed)
Patient is alert and oriented X4. Patient denies SI, HI and AVH. Patient attends groups. Patient states, "I am just ready to go home." Patient is compliant with medications. Safety checks will continue Q 15 minutes. Patient is in a pleasant mood this morning. Nurse will continue to encourage group meeting attendance. Patient concerned about nausea due to new medication, Depakote, medication education provided to patient.

## 2017-05-19 NOTE — Progress Notes (Signed)
patient is calm and in her room stable with eyes closed respiration unlabored, 15 minute checks in progress no complain of pain at this time

## 2017-05-19 NOTE — Progress Notes (Signed)
Recreation Therapy Notes   Date: 11.27.18  Time: 1:00 pm  Location: Craft Room  Behavioral response: Appropriate  Intervention Topic: Self- esteem  Discussion/Intervention: Group content was focused on self-esteem. The group explained what self-esteem is and where it comes from. Individual described the importance of self-esteem and ways self-esteem can be impacted. Patients expressed positive ways to improve self- esteem. The group participated in the intervention "Collage of you", where they were able to test their self-esteem and make a visual aid of their representation.  Clinical Observations/Feedback:  Patient came to group and stated that self-esteem is how you feel about your self. She stated that self esteem comes from within. Individual expressed that positive self-esteem come promote self love. Patient was social with peers and staff while completing the intervention.   Esmay Amspacher LRT/CTRS         Endiya Klahr 05/19/2017 2:35 PM

## 2017-05-19 NOTE — BHH Group Notes (Signed)
[place] Group Notes:  (Nursing/MHT/Case Management/Adjunct)  Date:  05/19/2017  Time:  4:15 AM  Type of Therapy:  Psychoeducational Skills  Participation Level:  Did Not Attend   Joretta Bachelor 05/19/2017, 4:15 AM

## 2017-05-20 MED ORDER — TEMAZEPAM 15 MG PO CAPS
15.0000 mg | ORAL_CAPSULE | Freq: Once | ORAL | Status: AC
  Administered 2017-05-20: 15 mg via ORAL
  Filled 2017-05-20: qty 1

## 2017-05-20 MED ORDER — TRAZODONE HCL 100 MG PO TABS
100.0000 mg | ORAL_TABLET | Freq: Every day | ORAL | Status: DC
  Administered 2017-05-20: 100 mg via ORAL
  Filled 2017-05-20: qty 1

## 2017-05-20 MED ORDER — LOPERAMIDE HCL 2 MG PO CAPS
2.0000 mg | ORAL_CAPSULE | ORAL | Status: DC | PRN
  Administered 2017-05-20: 2 mg via ORAL
  Filled 2017-05-20: qty 1

## 2017-05-20 MED ORDER — DOCUSATE SODIUM 100 MG PO CAPS: 100.0000 mg | ORAL_CAPSULE | Freq: Two times a day (BID) | ORAL | Status: DC

## 2017-05-20 NOTE — BHH Group Notes (Signed)
05/20/2017 9:30am  Type of Therapy/Topic:  Group Therapy:  Emotion Regulation  Participation Level:  Active   Description of Group:   The purpose of this group is to assist patients in learning to regulate negative emotions and experience positive emotions. Patients will be guided to discuss ways in which they have been vulnerable to their negative emotions. These vulnerabilities will be juxtaposed with experiences of positive emotions or situations, and patients will be challenged to use positive emotions to combat negative ones. Special emphasis will be placed on coping with negative emotions in conflict situations, and patients will process healthy conflict resolution skills.  Therapeutic Goals: 1. Patient will identify two positive emotions or experiences to reflect on in order to balance out negative emotions 2. Patient will label two or more emotions that they find the most difficult to experience 3. Patient will demonstrate positive conflict resolution skills through discussion and/or role plays  Summary of Patient Progress:  Stayed entire time, actively engaged. [name] states that she is "feeling good since now she has the correct diagnoses and medications that work for her". She was able to identify negative emotions that he experiences such as anger, sadness and fear. Mood was good.  Therapeutic Modalities:   Cognitive Behavioral Therapy Feelings Identification Dialectical Behavioral Therapy   Darin Engels, [place] 05/20/2017 11:33 AM

## 2017-05-20 NOTE — Progress Notes (Signed)
Patient is stable respiration is normal, slept through the night with out any incidents, endorses for safety of self and others denies any thoughts of SI/HI and denies A/V/H at this time.

## 2017-05-20 NOTE — Progress Notes (Signed)
Request made to [facility] for a R loftstrand crutch. Patient to be discharged on 05/21/17.

## 2017-05-20 NOTE — Plan of Care (Signed)
  Safety: Ability to remain free from injury will improve 05/20/2017 2152 - Progressing by Harl Bowie, RN

## 2017-05-20 NOTE — Plan of Care (Signed)
Patient is alert and oriented x 4. Continues to ambulate via wheelchair. Compliant with medications and meals. Attends groups and actively participates. No complaints of pain this shift, denies SI/HI/AVH. Milieu remains safe with q 15 minute safety checks.

## 2017-05-20 NOTE — Progress Notes (Signed)
Patient is alert and oriented x 4. Continues to ambulate via wheelchair. Compliant with medications and meals. Attends groups and actively participates. No complaints of pain this shift, denies SI/HI/AVH. Milieu remains safe with q 15 minute safety checks.

## 2017-05-20 NOTE — BHH Group Notes (Signed)
[place] Group Notes:  (Nursing/MHT/Case Management/Adjunct)  Date:  05/20/2017  Time:  9:39 PM  Type of Therapy:  Group Therapy  Participation Level:  Active  Participation Quality:  Sharing  Affect:  Appropriate  Cognitive:  Alert  Insight:  Appropriate  Engagement in Group:  Engaged  Modes of Intervention:  Discussion  Summary of Progress/Problems:  [name] 05/20/2017, 9:39 PM

## 2017-05-20 NOTE — Progress Notes (Signed)
Recreation Therapy Notes   Date: 11.28.18  Time: 1:00pm  Location: Craft Room  Behavioral response: Appropriate  Intervention Topic: Stress  Discussion/Intervention: Group content on today was focused on stress. The group defined stress and way to cope with stress. Participants expressed how they know when they are stressed out. Individuals described the different ways they have available to cope with stress. The group stated reasons why it is important to cope with stress. Patient explained what good stress is and some examples. The group participated in the intervention "Stress Management". Individuals were able assess their stress level and learn new ways to improve stress level.   Clinical Observations/Feedback:  Patient came to group and agreed with her peers statements about stress. She identified meditation and reading as ways she deals with stress. Draeden Kellman LRT/CTRS         [name] 05/20/2017 2:33 PM

## 2017-05-20 NOTE — Progress Notes (Signed)
[facility] MD Progress Note  05/20/2017 1:23 PM [name]  MRN:  [id number]  Subjective:   [name] continues to improve. She is in control of her behavior, denies thoughts of hurting herself, mood is good, affect brighter. She complains od dizziness and nausea that she believes are from Depakote. She wants to continue on medication anyway. Sleep was interrupted.   Treatment plan. We will continue current regimen with Depakote, Effexor and Saphris. I will add low dose of Trazodone. Trazodone 300 mg was discontinued due to low BP. She is on Restoril here which I will not be able to prescribe for alcoholic patient.  Principal Problem: Severe recurrent major depression without psychotic features ([place]) Diagnosis:   Patient Active Problem List   Diagnosis Date Noted  . High anion gap metabolic acidosis [W09.8] 05/08/2017  . Overdose of antidepressant [T43.201A] 07/03/2016  . 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 Severe recurrent major depression without psychotic features ([place]) [F33.2] 07/03/2015  . Alcohol use disorder, severe, dependence ([place]) [F10.20] 01/26/2015  . Tobacco use disorder [F17.200] 01/26/2015  . Alcohol withdrawal ([place]) [F10.239] 01/08/2015  . 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 GAD (generalized anxiety disorder) [F41.1] 12/24/2013  . PTSD (post-traumatic stress disorder) [F43.10] 12/22/2013   Total Time spent with patient: 30 minutes  Past Psychiatric History: depression, alcoholism  Past Medical History:  Past Medical History:  Diagnosis Date  . Anxiety   . Anxiety    panic anxiety disorder  . Arthritis   . Asthma    chronic asthmatic bronchitis  . Cancer [facility])    Desmoid tumor left forearm  . Depression   . ETOH abuse   . Heart murmur   . Insomnia   . Neuropathy     Past Surgical History:  Procedure Laterality Date  . ABDOMINAL HYSTERECTOMY    . ABDOMINAL HYSTERECTOMY  2007  . 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 ARM AMPUTATION AT SHOULDER Left   . CESAREAN SECTION    . [place]   x 2  . JOINT REPLACEMENT Right 2012   hip  . LUMBAR LAMINECTOMY/DECOMPRESSION MICRODISCECTOMY Left 04/16/2016   Procedure: LUMBAR LAMINECTOMY/DECOMPRESSION MICRODISCECTOMY 1 LEVEL;  Surgeon: Meade Maw, MD;  Location: [facility] ORS;  Service: Neurosurgery;  Laterality: Left;  Left L4-5 far lateral discectomy, left L4-5 laminoforaminotomy  . TOTAL HIP ARTHROPLASTY Left 09/17/2015   Procedure: TOTAL HIP ARTHROPLASTY;  Surgeon: Dereck Leep, MD;  Location: [facility] ORS;  Service: Orthopedics;  Laterality: Left;   Family History:  Family History  Problem Relation Age of Onset  . COPD Mother    Family Psychiatric  History: depression Social History:  Social History   Substance and Sexual Activity  Alcohol Use Yes   Comment: 2 40oz beers daily     Social History   Substance and Sexual Activity  Drug Use No   Comment: Patient denies     Social History   Socioeconomic History  . Marital status: Divorced    Spouse name: None  . Number of children: None  . Years of education: None  . Highest education level: None  Social Needs  . Financial resource strain: None  . Food insecurity - worry: None  . Food insecurity - inability: None  . Transportation needs - medical: None  . Transportation needs - non-medical: None  Occupational History  . None  Tobacco Use  . Smoking  status: Current Every Day Smoker    Packs/day: 0.25    Types: Cigarettes  . Smokeless tobacco: Never Used  Substance and Sexual Activity  . Alcohol use: Yes    Comment: 2 40oz beers daily  . Drug use: No    Comment: Patient denies   . Sexual activity: Yes  Other Topics Concern  . None  Social History Narrative   ** Merged History Encounter **       Additional Social History:    History of alcohol / drug use?: Yes                    Sleep: Poor  Appetite:   Fair  Current Medications: Current Facility-Administered Medications  Medication Dose Route Frequency Provider Last Rate Last Dose  . acetaminophen (TYLENOL) tablet 650 mg  650 mg Oral Q6H PRN Clapacs, [name], MD   650 mg at 05/18/17 1733  . alum & mag hydroxide-simeth (MAALOX/MYLANTA) 200-200-20 MG/5ML suspension 30 mL  30 mL Oral Q4H PRN Clapacs, [name], MD      . asenapine (SAPHRIS) sublingual tablet 10 mg  10 mg Sublingual QHS Pucilowska, [name], MD   10 mg at 05/19/17 2135  . bisacodyl (DULCOLAX) suppository 10 mg  10 mg Rectal Daily PRN Ramond Dial, MD      . divalproex (DEPAKOTE) DR tablet 500 mg  500 mg Oral Q8H Pucilowska, [name], MD   500 mg at 05/20/17 0620  . docusate sodium (COLACE) capsule 200 mg  200 mg Oral BID Pucilowska, [name], MD   200 mg at 05/19/17 1810  . folic acid (FOLVITE) tablet 1 mg  1 mg Oral Daily Clapacs, [name], MD   1 mg at 05/20/17 0850  . gabapentin (NEURONTIN) capsule 900 mg  900 mg Oral TID Clapacs, [name], MD   900 mg at 05/20/17 1222  . magnesium hydroxide (MILK OF MAGNESIA) suspension 30 mL  30 mL Oral Daily PRN Clapacs, [name], MD   30 mL at 05/19/17 0816  . multivitamin with minerals tablet 1 tablet  1 tablet Oral Daily Clapacs, [name], MD   1 tablet at 05/20/17 0850  . nicotine (NICODERM CQ - dosed in mg/24 hours) patch 21 mg  21 mg Transdermal Daily McNew, [name], MD   21 mg at 05/20/17 0848  . pantoprazole (PROTONIX) EC tablet 40 mg  40 mg Oral Daily Pucilowska, [name], MD   40 mg at 05/20/17 0850  . prochlorperazine (COMPAZINE) tablet 10 mg  10 mg Oral Q6H PRN Pucilowska, [name], MD   10 mg at 05/20/17 0623  . propranolol (INDERAL) tablet 10 mg  10 mg Oral TID Pucilowska, [name], MD   10 mg at 05/20/17 1222  . senna-docusate (Senokot-S) tablet 1 tablet  1 tablet Oral BID Clapacs, [name], MD   1 tablet at 05/19/17 1809  . temazepam (RESTORIL) capsule 15 mg  15 mg Oral QHS PRN Pucilowska, [name], MD      . thiamine (VITAMIN  B-1) tablet 100 mg  100 mg Oral Daily Clapacs, [name], MD   100 mg at 05/20/17 0850  . venlafaxine XR (EFFEXOR-XR) 24 hr capsule 150 mg  150 mg Oral Q breakfast Clapacs, [name], MD   150 mg at 05/20/17 6073    Lab Results: No results found for this or any previous visit (from the past 48 hour(s)).  Blood Alcohol level:  Lab Results  Component Value  Date   ETH <10 05/10/2017   ETH 189 (H) 70/78/6754    Metabolic Disorder Labs: Lab Results  Component Value Date   HGBA1C 5.0 05/12/2017   MPG 96.8 05/12/2017   MPG 97 07/05/2016   No results found for: PROLACTIN Lab Results  Component Value Date   CHOL 225 (H) 05/12/2017   TRIG 185 (H) 05/12/2017   HDL 58 05/12/2017   CHOLHDL 3.9 05/12/2017   VLDL 37 05/12/2017   LDLCALC 130 (H) 05/12/2017   LDLCALC 136 (H) 07/05/2016    Physical Findings: AIMS: Facial and Oral Movements Muscles of Facial Expression: None, normal Lips and Perioral Area: None, normal Jaw: None, normal Tongue: None, normal,Extremity Movements Upper (arms, wrists, hands, fingers): None, normal Lower (legs, knees, ankles, toes): None, normal, Trunk Movements Neck, shoulders, hips: None, normal, Overall Severity Severity of abnormal movements (highest score from questions above): None, normal Incapacitation due to abnormal movements: None, normal Patient's awareness of abnormal movements (rate only patient's report): No Awareness, Dental Status Current problems with teeth and/or dentures?: No Does patient usually wear dentures?: No  CIWA:  CIWA-Ar Total: 1 COWS:  COWS Total Score: 3  Musculoskeletal: Strength & Muscle Tone: decreased Gait & Station: unsteady Patient leans: N/A  Psychiatric Specialty Exam: Physical Exam  Nursing note and vitals reviewed. Psychiatric: She has a normal mood and affect. Her speech is normal and behavior is normal. Thought content normal. Cognition and memory are normal. She expresses impulsivity.    Review of Systems   Gastrointestinal: Positive for nausea.  Musculoskeletal: Positive for back pain.  Neurological: Positive for dizziness and weakness.  Psychiatric/Behavioral: Positive for substance abuse.  All other systems reviewed and are negative.   Blood pressure (!) 148/72, pulse 85, temperature 97.8 F (36.6 C), temperature source Oral, resp. rate 17, height 5\' 5"  (1.651 m), weight 64.4 kg (142 lb), SpO2 100 %.Body mass index is 23.63 kg/m.  General Appearance: Casual  Eye Contact:  Good  Speech:  Clear and Coherent  Volume:  Normal  Mood:  Euthymic  Affect:  Appropriate  Thought Process:  Goal Directed and Descriptions of Associations: Intact  Orientation:  Full (Time, Place, and Person)  Thought Content:  WDL  Suicidal Thoughts:  No  Homicidal Thoughts:  No  Memory:  Immediate;   Fair Recent;   Fair Remote;   Fair  Judgement:  Impaired  Insight:  Shallow  Psychomotor Activity:  Normal  Concentration:  Concentration: Fair and Attention Span: Fair  Recall:  [employer] of Knowledge:  Fair  Language:  Fair  Akathisia:  No  Handed:  Right  AIMS (if indicated):     Assets:  Communication Skills Desire for Improvement Financial Resources/Insurance Housing Intimacy Physical Health Resilience Social Support  ADL's:  Intact  Cognition:  WNL  Sleep:  Number of Hours: 8.15     Treatment Plan Summary: Daily contact with patient to assess and evaluate symptoms and progress in treatment and Medication management   Ms. Edell is a 61 year old female with a history of depression and alcoholism transferred from medical floor where she was briefly hospitalized for alcohol detox. She has had severe anxiety over the weekend that culminated in a "meltdown" yesterday. Symptoms are improving on Depakote and Saphris.  #Mood -continue Effexor 150 mg daily -continue Depakote 500 mg TID, level in am -continue Saphris 10 mg nightly  #Anxiety -continue Propranolol 10 mg TID, BP is elevated  today  #Chronic pain -Continue Neurontin 900 mg TID  #Insomnia -  restart Trazodone 100 mg nightly  -discontinue Restoril -Saphris 10 mg nightly  #Alcohol withdrawal -Librium taper is complete. -refuses residential treatment of pharmacotherapy for alcoholism -open to [facility]  #GERD -Protonix 40 mg daily -start Compazine for nausea  # URI -completed a course of azithromycin  #Smoking -Nicotine patch is available  #Hypotension, resolved  #Falls -PT consult is appreciated -SNF recommended after dischargebut the patient refuses -needs [employer] for home  #Disposition -discharge to home with home health -follow up with [facility] for medication management and [facility]    Orson Slick, MD 05/20/2017, 1:23 PM

## 2017-05-21 LAB — COMPREHENSIVE METABOLIC PANEL
ALBUMIN: 3.7 g/dL (ref 3.5–5.0)
ALK PHOS: 60 U/L (ref 38–126)
ALT: 14 U/L (ref 14–54)
ANION GAP: 9 (ref 5–15)
AST: 22 U/L (ref 15–41)
BUN: 14 mg/dL (ref 6–20)
CHLORIDE: 98 mmol/L — AB (ref 101–111)
CO2: 24 mmol/L (ref 22–32)
Calcium: 9.4 mg/dL (ref 8.9–10.3)
Creatinine, Ser: 0.68 mg/dL (ref 0.44–1.00)
Glucose, Bld: 124 mg/dL — ABNORMAL HIGH (ref 65–99)
POTASSIUM: 3.8 mmol/L (ref 3.5–5.1)
Sodium: 131 mmol/L — ABNORMAL LOW (ref 135–145)
TOTAL PROTEIN: 6.7 g/dL (ref 6.5–8.1)
Total Bilirubin: 0.6 mg/dL (ref 0.3–1.2)

## 2017-05-21 LAB — VALPROIC ACID LEVEL: Valproic Acid Lvl: 79 ug/mL (ref 50.0–100.0)

## 2017-05-21 MED ORDER — DIVALPROEX SODIUM 500 MG PO DR TAB: 500.0000 mg | DELAYED_RELEASE_TABLET | Freq: Two times a day (BID) | ORAL | Status: DC

## 2017-05-21 MED ORDER — PROPRANOLOL HCL 10 MG PO TABS: 10.0000 mg | ORAL_TABLET | Freq: Three times a day (TID) | ORAL | 1 refills | Status: DC

## 2017-05-21 MED ORDER — SENNOSIDES-DOCUSATE SODIUM 8.6-50 MG PO TABS: 1.0000 | ORAL_TABLET | Freq: Two times a day (BID) | ORAL | 1 refills | Status: DC | PRN

## 2017-05-21 MED ORDER — ASENAPINE MALEATE 5 MG SL SUBL: 10.0000 mg | SUBLINGUAL_TABLET | Freq: Every day | SUBLINGUAL | 1 refills | Status: DC

## 2017-05-21 MED ORDER — PANTOPRAZOLE SODIUM 40 MG PO TBEC: 40.0000 mg | DELAYED_RELEASE_TABLET | Freq: Every day | ORAL | 1 refills | Status: DC

## 2017-05-21 MED ORDER — PROCHLORPERAZINE MALEATE 10 MG PO TABS: 10.0000 mg | ORAL_TABLET | Freq: Four times a day (QID) | ORAL | 1 refills | Status: DC | PRN

## 2017-05-21 MED ORDER — THIAMINE HCL 100 MG PO TABS: 100.0000 mg | ORAL_TABLET | Freq: Every day | ORAL | 1 refills | Status: DC

## 2017-05-21 MED ORDER — TRAZODONE HCL 100 MG PO TABS: 200.0000 mg | ORAL_TABLET | Freq: Every day | ORAL | 0 refills | Status: DC

## 2017-05-21 MED ORDER — ADULT MULTIVITAMIN W/MINERALS CH: 1.0000 | ORAL_TABLET | Freq: Every day | ORAL | 1 refills | Status: DC

## 2017-05-21 MED ORDER — DIVALPROEX SODIUM 500 MG PO DR TAB: 500.0000 mg | DELAYED_RELEASE_TABLET | Freq: Two times a day (BID) | ORAL | 1 refills | Status: DC

## 2017-05-21 MED ORDER — FOLIC ACID 1 MG PO TABS: 1.0000 mg | ORAL_TABLET | Freq: Every day | ORAL | 1 refills | Status: DC

## 2017-05-21 MED ORDER — GABAPENTIN 300 MG PO CAPS: 900.0000 mg | ORAL_CAPSULE | Freq: Three times a day (TID) | ORAL | 1 refills | Status: DC

## 2017-05-21 MED ORDER — VENLAFAXINE HCL ER 150 MG PO CP24: 150.0000 mg | ORAL_CAPSULE | Freq: Every day | ORAL | 1 refills | Status: DC

## 2017-05-21 NOTE — Progress Notes (Signed)
Patient is scheduled to be discharged on the above date. Patient will be transported home via a taxi. Patient will be leaving with her personal items and prescriptions. No complaints of voiced. Will be leaving with a cane provided by PT.

## 2017-05-21 NOTE — Progress Notes (Signed)
Recreation Therapy Notes  INPATIENT RECREATION TR PLAN  Patient Details Name: [name] MRN: [id number] DOB: Jan 15, 1956 Today's Date: 05/21/2017  Rec Therapy Plan Is patient appropriate for Therapeutic Recreation?: Yes Treatment times per week: at least 3 Estimated Length of Stay: 5-7 days TR Treatment/Interventions: Group participation (Comment)(Appropriate participation in recreation therapy tx.)  Discharge Criteria Pt will be discharged from therapy if:: Discharged Treatment plan/goals/alternatives discussed and agreed upon by:: Patient/family  Discharge Summary Short term goals set: Patient will identify 3 positive coping strategies to use post discharge x5 days.  Short term goals met: Complete Progress toward goals comments: Groups attended Which groups?: Self-esteem, Communication, Leisure education, Stress management, Other (Comment)(Values,Time management) Reason goals not met: N/A Therapeutic equipment acquired: N/A Reason patient discharged from therapy: Discharge from hospital Pt/family agrees with progress & goals achieved: Yes Date patient discharged from therapy: 05/21/17   Draylon Mercadel 05/21/2017, 12:55 PM

## 2017-05-21 NOTE — Progress Notes (Signed)
D:Patient is alert and oriented x 4,denies SI/HI/AVH, no withdrawals S/S noted, mood is pleasant and cooperative, her thoughts are organized, rated 5/10 on a scale of  (0 -10). Patient appears less anxious this eveningand she is interacting with peers and staff appropriately.  A:Pt was offered support andencouraged to attend groups.15 minute checks were done for safety.  R:Pt attends groups and interacts appropriatelyl with peers and staff, complaint withmedication,safety maintained on unit.

## 2017-05-21 NOTE — Progress Notes (Signed)
CSW spoke to pt regarding transportation home.  Pt reports she has no options at all and her husband does not drive, no other friends or relatives.  CSW discussed with pt if she is able to ride home on a taxi and pt reports she can.  She does not use a wheelchair at home, only a cane, and will not have problems getting in and out of taxi.  Pt has been in wheelchair throughout stay on [facility] and reports the [employer] is almost one mile from her home--bus transportation not an option.  CSW texted with Nathaniel Man, Asst director, regarding use of taxi and received approval. Winferd Humphrey, MSW, LCSW Clinical Social Worker 05/21/2017 11:19 AM

## 2017-05-21 NOTE — BHH Suicide Risk Assessment (Addendum)
[facility] Discharge Suicide Risk Assessment   Principal Problem: Bipolar I disorder, most recent episode mixed, severe with psychotic features [facility]) Discharge Diagnoses:  Patient Active Problem List   Diagnosis Date Noted  . High anion gap metabolic acidosis [Z02.5] 05/08/2017  . Overdose of antidepressant [T43.201A] 07/03/2016  . 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 Bipolar I disorder, most recent episode mixed, severe with psychotic features ([place]) [F31.64] 07/03/2015  . Alcohol use disorder, severe, dependence ([place]) [F10.20] 01/26/2015  . Tobacco use disorder [F17.200] 01/26/2015  . Alcohol withdrawal ([place]) [F10.239] 01/08/2015  . 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 GAD (generalized anxiety disorder) [F41.1] 12/24/2013  . PTSD (post-traumatic stress disorder) [F43.10] 12/22/2013    Total Time spent with patient: 30 minutes  Musculoskeletal: Strength & Muscle Tone: decreased Gait & Station: unsteady Patient leans: N/A  Psychiatric Specialty Exam: Review of Systems  Musculoskeletal: Positive for falls.  Neurological: Positive for weakness.  Psychiatric/Behavioral: Negative.   All other systems reviewed and are negative.   Blood pressure 118/74, pulse 70, temperature 97.8 F (36.6 C), temperature source Oral, resp. rate 18, height 5\' 5"  (1.651 m), weight 64.4 kg (142 lb), SpO2 100 %.Body mass index is 23.63 kg/m.  General Appearance: Casual  Eye Contact::  Good  Speech:  Clear and Coherent409  Volume:  Normal  Mood:  Euthymic  Affect:  Appropriate  Thought Process:  Goal Directed and Descriptions of Associations: Intact  Orientation:  Full (Time, Place, and Person)  Thought Content:  WDL  Suicidal Thoughts:  No  Homicidal Thoughts:  No  Memory:  Immediate;   Fair Recent;   Fair Remote;   Fair  Judgement:  Impaired  Insight:  Shallow  Psychomotor Activity:  Normal  Concentration:  Fair  Recall:  [place]  Language: Fair  Akathisia:  No  Handed:  Right  AIMS (if indicated):     Assets:  Communication Skills Desire for Improvement Financial Resources/Insurance Housing Intimacy Resilience Social Support  Sleep:  Number of Hours: 6.15  Cognition: WNL  ADL's:  Intact   Mental Status Per Nursing Assessment::   On Admission:  NA  Demographic Factors:  Caucasian and Low socioeconomic status  Loss Factors: Financial problems/change in socioeconomic status  Historical Factors: Prior suicide attempts, Family history of mental illness or substance abuse and Impulsivity  Risk Reduction Factors:   Sense of responsibility to family, Living with another person, especially a relative, Positive social support and Positive therapeutic relationship  Continued Clinical Symptoms:  Depression:   Comorbid alcohol abuse/dependence Impulsivity Alcohol/Substance Abuse/Dependencies  Cognitive Features That Contribute To Risk:  None    Suicide Risk:  Minimal: No identifiable suicidal ideation.  Patients presenting with no risk factors but with morbid ruminations; may be classified as minimal risk based on the severity of the depressive symptoms  Follow-up Information    [facility] Follow up on 05/22/2017.   Why:  Lanae Boast (Peer support specialist) will pick you up on Friday, 05/22/17 at [facility] for your first appointment.  Contact information: [place] [address] [phone]           Plan Of Care/Follow-up recommendations:  Activity:  as tolerated Diet:  low sodium heart healthy Other:  keep follow up appointments  Orson Slick, MD 05/21/2017, 4:57 PM

## 2017-05-21 NOTE — Progress Notes (Signed)
  [facility] Adult Case Management Discharge Plan :  Will you be returning to the same living situation after discharge:  Yes,  Home with husband At discharge, do you have transportation home?: Yes,  Cab Do you have the ability to pay for your medications: Yes,  Insurance  Release of information consent forms completed and in the chart;  Patient's signature needed at discharge.  Patient to Follow up at: Follow-up Information    [facility] Follow up on 05/25/2017.   Why:  Lanae Boast (Peer support specialist) will pick you up on Monday, 05/25/17 for your first appointment.  Contact information: [place] [phone]           Next level of care provider has access to [place] and Suicide Prevention discussed: Yes,  Husband Rennie Plowman  Have you used any form of tobacco in the last 30 days? (Cigarettes, Smokeless Tobacco, Cigars, and/or Pipes): Yes  Has patient been referred to the Quitline?: Patient refused referral  Patient has been referred for addiction treatment: Yes  Darin Engels, LCSW 05/21/2017, 9:17 AM

## 2017-05-21 NOTE — BHH Group Notes (Signed)
05/21/2017  Time: 0930  Type of Therapy/Topic:  Group Therapy:  Balance in Life  Participation Level:  Active  Description of Group:   This group will address the concept of balance and how it feels and looks when one is unbalanced. Patients will be encouraged to process areas in their lives that are out of balance and identify reasons for remaining unbalanced. Facilitators will guide patients in utilizing problem-solving interventions to address and correct the stressor making their life unbalanced. Understanding and applying boundaries will be explored and addressed for obtaining and maintaining a balanced life. Patients will be encouraged to explore ways to assertively make their unbalanced needs known to significant others in their lives, using other group members and facilitator for support and feedback.  Therapeutic Goals: 1. Patient will identify two or more emotions or situations they have that consume much of in their lives. 2. Patient will identify signs/triggers that life has become out of balance:  3. Patient will identify two ways to set boundaries in order to achieve balance in their lives:  4. Patient will demonstrate ability to communicate their needs through discussion and/or role plays  Summary of Patient Progress: Pt continues to work towards her tx goals. Pt was able to appropriately participate in group discussions, and was able to offer support and validation to other group members. [name] reported that two areas that she feels consumes too much of her life are, "financial and mental health. If those two things are out of whack, then everything else is too. But, I think if I keep those balanced, then I'm able to make sure everything else is okay too." Pt reported one area of her life she'd like to devote more attention to is, "physical health. I need to make sure I'm doing everything I can to stay healthy in a physical way--including staying away from alcohol."       Therapeutic Modalities:   Cognitive Behavioral Therapy Solution-Focused Therapy Assertiveness Training   Alden Hipp, MSW, LCSW 05/21/2017 10:22 AM

## 2017-05-21 NOTE — Progress Notes (Signed)
Patient discharged on above date and time. Rx's in hand, transported to her home via [facility] [facility]. No complaints of voiced. Verbalized understanding of the discharge instructions provided to her upon discharge.

## 2017-05-21 NOTE — Discharge Summary (Signed)
Physician Discharge Summary Note  Patient:  [name] is an 61 y.o., female MRN:  [id number] DOB:  1956/06/15 Patient phone:  [phone] (home)  Patient address:   [address] [place] [address],  Total Time spent with patient: 30 minutes  Date of Admission:  05/11/2017 Date of Discharge: 05/21/2017  Reason for Admission:  Mood instability, alcohol detox  Identifying data. Ms. [name] is a 61 year old female with a history of depression and alcoholism.  Chief complaint. "I needed out."  History of present illness. Information was obtained from the patient and the chart. The patient came to the ER asking for alcohol detox and complaining of worsening of depression in spite of good treatment compliance. She got married in February but since then, her husband lost his job and started drinking. After a while, she joined in and has been drinking a case of beer a day. She was briefly admitted to medical floor. She reports many symptoms of depression with poor sleep, decreased appetite, anhedonia, feeling of guilt, hopelessness, helplessness, poor energy and concentration, social isolation and crying spells. She denies psychotic symptoms. Anxiety has been controlled with alcohol. She denies illicit drug or prescription pill abuse.  During the interview, she is tearful, hopeless and reports passive suicidal thoughts.  Past psychiatric history. Multiple hospitalizations and medication trials for depression. There were three suicide attempts by cutting and overdose 15 years ago. She is in the care of Dr. Leonides Schanz at [facility] and is very satisfied with her current regimen of Effexor, Neurontin and Trazodone. There is a long history of drinking but she refuses residential treatment.  Family psychiatric history. Multiple females in the family with depression and anxiety.  Social history. She lives with her husband. There are financial difficulties and she is not able to pay her rent. Her  church has been helping.  Principal Problem: Bipolar I disorder, most recent episode mixed, severe with psychotic features [facility]) Discharge Diagnoses: Patient Active Problem List   Diagnosis Date Noted  . High anion gap metabolic acidosis [V40.0] 05/08/2017  . Overdose of antidepressant [T43.201A] 07/03/2016  . 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 Bipolar I disorder, most recent episode mixed, severe with psychotic features ([place]) [F31.64] 07/03/2015  . Alcohol use disorder, severe, dependence ([place]) [F10.20] 01/26/2015  . Tobacco use disorder [F17.200] 01/26/2015  . Alcohol withdrawal ([place]) [F10.239] 01/08/2015  . 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 GAD (generalized anxiety disorder) [F41.1] 12/24/2013  . PTSD (post-traumatic stress disorder) [F43.10] 12/22/2013    Past Medical History:  Past Medical History:  Diagnosis Date  . Anxiety   . Anxiety    panic anxiety disorder  . Arthritis   . Asthma    chronic asthmatic bronchitis  . Cancer [facility])    Desmoid tumor left forearm  . Depression   . ETOH abuse   . Heart murmur   . Insomnia   . Neuropathy     Past Surgical History:  Procedure Laterality Date  . ABDOMINAL HYSTERECTOMY    . ABDOMINAL HYSTERECTOMY  2007  . 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 ARM AMPUTATION AT SHOULDER Left   . CESAREAN SECTION    . [place]   x 2  . JOINT REPLACEMENT Right 2012   hip  . LUMBAR LAMINECTOMY/DECOMPRESSION MICRODISCECTOMY Left 04/16/2016   Procedure: LUMBAR LAMINECTOMY/DECOMPRESSION MICRODISCECTOMY 1 LEVEL;  Surgeon: Meade Maw, MD;  Location: [facility]  ORS;  Service: Neurosurgery;  Laterality: Left;  Left L4-5 far lateral discectomy, left L4-5 laminoforaminotomy  . TOTAL HIP ARTHROPLASTY Left 09/17/2015   Procedure: TOTAL HIP ARTHROPLASTY;  Surgeon: Dereck Leep, MD;  Location: [facility] ORS;  Service: Orthopedics;  Laterality: Left;    Family History:  Family History  Problem Relation Age of Onset  . COPD Mother    Social History:  Social History   Substance and Sexual Activity  Alcohol Use Yes   Comment: 2 40oz beers daily     Social History   Substance and Sexual Activity  Drug Use No   Comment: Patient denies     Social History   Socioeconomic History  . Marital status: Divorced    Spouse name: None  . Number of children: None  . Years of education: None  . Highest education level: None  Social Needs  . Financial resource strain: None  . Food insecurity - worry: None  . Food insecurity - inability: None  . Transportation needs - medical: None  . Transportation needs - non-medical: None  Occupational History  . None  Tobacco Use  . Smoking status: Current Every Day Smoker    Packs/day: 0.25    Types: Cigarettes  . Smokeless tobacco: Never Used  Substance and Sexual Activity  . Alcohol use: Yes    Comment: 2 40oz beers daily  . Drug use: No    Comment: Patient denies   . Sexual activity: Yes  Other Topics Concern  . None  Social History Narrative   ** Merged History Encounter **        Hospital Course:   Ms. [name] is a 61 year old female with a history of depression and alcoholism transferred from medical floor where she was briefly hospitalized for alcohol detox. She started displaying symptoms of mixed bipolar episode and was stabilized on a combination od Depakote and Saphris.   #Mood -continue Effexor 150 mg daily -lower Depakote to 500 mg BID, level on 1500 was 78 but sodium 131 -continue Saphris 10 mg nightly  #Anxiety -continue Propranolol 10 mg TID  #Chronic pain -Continue Neurontin 900 mg TID  #Insomnia -continue Trazodone 200 mg nightly  -Saphris 10 mg nightly  #Alcohol withdrawal -Librium taper is complete. -refuses residential treatment or pharmacotherapy for alcoholism -open to [facility]  #GERD -Protonix 40 mg daily -continue Compazine for  nausea  #Respiratory infection -completed a course of azithromycin  #Smoking -Nicotine patch was available  #Hypotension, resolved  #Falls -PT consult is appreciated -patient discharged with [facility] and home PT  #Disposition -discharge to home -follow up with [facility] for medication management and [facility]  Physical Findings: AIMS: Facial and Oral Movements Muscles of Facial Expression: None, normal Lips and Perioral Area: None, normal Jaw: None, normal Tongue: None, normal,Extremity Movements Upper (arms, wrists, hands, fingers): None, normal Lower (legs, knees, ankles, toes): None, normal, Trunk Movements Neck, shoulders, hips: None, normal, Overall Severity Severity of abnormal movements (highest score from questions above): None, normal Incapacitation due to abnormal movements: None, normal Patient's awareness of abnormal movements (rate only patient's report): No Awareness, Dental Status Current problems with teeth and/or dentures?: No Does patient usually wear dentures?: No  CIWA:  CIWA-Ar Total: 1 COWS:  COWS Total Score: 3  Musculoskeletal: Strength & Muscle Tone: decreased Gait & Station: unsteady Patient leans: N/A  Psychiatric Specialty Exam: Physical Exam  Nursing note and vitals reviewed. Psychiatric: She has a normal mood and affect. Her speech is normal and behavior is  normal. Thought content normal. Cognition and memory are normal. She expresses impulsivity.    Review of Systems  Musculoskeletal: Positive for back pain.  Neurological: Positive for weakness.  Psychiatric/Behavioral: Positive for substance abuse.  All other systems reviewed and are negative.   Blood pressure 118/74, pulse 70, temperature 97.8 F (36.6 C), temperature source Oral, resp. rate 18, height 5\' 5"  (1.651 m), weight 64.4 kg (142 lb), SpO2 100 %.Body mass index is 23.63 kg/m.  General Appearance: Casual  Eye Contact:  Good  Speech:  Clear and Coherent  Volume:   Normal  Mood:  Euthymic  Affect:  Appropriate  Thought Process:  Goal Directed and Descriptions of Associations: Intact  Orientation:  Full (Time, Place, and Person)  Thought Content:  WDL  Suicidal Thoughts:  No  Homicidal Thoughts:  No  Memory:  Immediate;   Fair Recent;   Fair Remote;   Fair  Judgement:  Impaired  Insight:  Shallow  Psychomotor Activity:  Normal  Concentration:  Concentration: Fair and Attention Span: Fair  Recall:  [employer] of Knowledge:  Fair  Language:  Fair  Akathisia:  No  Handed:  Right  AIMS (if indicated):     Assets:  Communication Skills Desire for Improvement Financial Resources/Insurance Housing Intimacy Physical Health Resilience Social Support  ADL's:  Intact  Cognition:  WNL  Sleep:  Number of Hours: 6.15     Have you used any form of tobacco in the last 30 days? (Cigarettes, Smokeless Tobacco, Cigars, and/or Pipes): Yes  Has this patient used any form of tobacco in the last 30 days? (Cigarettes, Smokeless Tobacco, Cigars, and/or Pipes) Yes, Yes, A prescription for an FDA-approved tobacco cessation medication was offered at discharge and the patient refused  Blood Alcohol level:  Lab Results  Component Value Date   ETH <10 05/10/2017   ETH 189 (H) 07/01/3233    Metabolic Disorder Labs:  Lab Results  Component Value Date   HGBA1C 5.0 05/12/2017   MPG 96.8 05/12/2017   MPG 97 07/05/2016   No results found for: PROLACTIN Lab Results  Component Value Date   CHOL 225 (H) 05/12/2017   TRIG 185 (H) 05/12/2017   HDL 58 05/12/2017   CHOLHDL 3.9 05/12/2017   VLDL 37 05/12/2017   LDLCALC 130 (H) 05/12/2017   LDLCALC 136 (H) 07/05/2016    See Psychiatric Specialty Exam and Suicide Risk Assessment completed by Attending Physician prior to discharge.  Discharge destination:  Home  Is patient on multiple antipsychotic therapies at discharge:  No   Has Patient had three or more failed trials of antipsychotic monotherapy by  history:  No  Recommended Plan for Multiple Antipsychotic Therapies: NA  Discharge Instructions    Diet - low sodium heart healthy   Complete by:  As directed    Increase activity slowly   Complete by:  As directed      Allergies as of 05/21/2017      Reactions   Benadryl [diphenhydramine] Hives, Other (See Comments)   Reaction:  Hyperactivity    Benadryl [diphenhydramine] Other (See Comments)   My jaw locked and I could not speak, like a stroke.   Cephalosporins Hives   Codeine Nausea And Vomiting   Codeine Nausea Only   severe      Medication List    STOP taking these medications   azithromycin 250 MG tablet Commonly known as:  ZITHROMAX   LORazepam 2 MG tablet Commonly known as:  ATIVAN   phenol-menthol  14.5 MG lozenge     TAKE these medications     Indication  asenapine 5 MG Subl 24 hr tablet Commonly known as:  SAPHRIS Place 2 tablets (10 mg total) under the tongue at bedtime.  Indication:  Manic-Depression   divalproex 500 MG DR tablet Commonly known as:  DEPAKOTE Take 1 tablet (500 mg total) by mouth every 12 (twelve) hours.  Indication:  Manic Phase of Manic-Depression   folic acid 1 MG tablet Commonly known as:  FOLVITE Take 1 tablet (1 mg total) by mouth daily.  Indication:  Anemia From Inadequate Folic Acid   gabapentin 300 MG capsule Commonly known as:  NEURONTIN Take 3 capsules (900 mg total) by mouth 3 (three) times daily.  Indication:  Neuropathic Pain   multivitamin with minerals Tabs tablet Take 1 tablet by mouth daily.  Indication:  general health   pantoprazole 40 MG tablet Commonly known as:  PROTONIX Take 1 tablet (40 mg total) by mouth daily. Start taking on:  05/22/2017  Indication:  Gastroesophageal Reflux Disease   prochlorperazine 10 MG tablet Commonly known as:  COMPAZINE Take 1 tablet (10 mg total) by mouth every 6 (six) hours as needed for nausea or vomiting.  Indication:  Nausea and Vomiting   propranolol 10 MG  tablet Commonly known as:  INDERAL Take 1 tablet (10 mg total) by mouth 3 (three) times daily.  Indication:  Fine to Coarse Slow Tremor affecting Head, Hands & Voice   senna-docusate 8.6-50 MG tablet Commonly known as:  Senokot-S Take 1 tablet by mouth 2 (two) times daily as needed for mild constipation.  Indication:  Constipation   thiamine 100 MG tablet Take 1 tablet (100 mg total) by mouth daily.  Indication:  Deficiency in Thiamine or Vitamin B1   traZODone 100 MG tablet Commonly known as:  DESYREL Take 2 tablets (200 mg total) by mouth at bedtime. What changed:  how much to take  Indication:  Trouble Sleeping   venlafaxine XR 150 MG 24 hr capsule Commonly known as:  EFFEXOR-XR Take 1 capsule (150 mg total) by mouth daily with breakfast.  Indication:  Major Depressive Disorder      Follow-up Information    [facility] Follow up on 05/22/2017.   Why:  Lanae Boast (Peer support specialist) will pick you up on Friday, 05/22/17 at [facility] for your first appointment.  Contact information: [place] [address] [phone]           Follow-up recommendations:  Activity:  as tolerated Diet:  low sodium heart healthy Other:  keep follow up appointments  Comments:    Signed: Orson Slick, MD 05/21/2017, 4:58 PM

## 2018-02-26 ENCOUNTER — Other Ambulatory Visit: Payer: Self-pay | Admitting: Family Medicine

## 2018-02-26 DIAGNOSIS — R1310 Dysphagia, unspecified: Secondary | ICD-10-CM

## 2018-03-05 ENCOUNTER — Ambulatory Visit
Admission: RE | Admit: 2018-03-05 | Discharge: 2018-03-05 | Disposition: A | Discharge status: Home / Self Care | Payer: Medicare Other | Source: Ambulatory Visit | Attending: Family Medicine | Admitting: Family Medicine

## 2018-03-05 DIAGNOSIS — R1312 Dysphagia, oropharyngeal phase: Secondary | ICD-10-CM | POA: Diagnosis not present

## 2018-03-05 DIAGNOSIS — R1310 Dysphagia, unspecified: Secondary | ICD-10-CM | POA: Diagnosis present

## 2018-03-05 NOTE — Therapy (Signed)
[place], [place], [address] Phone: [phone]   Fax:     Speech Language Pathology Treatment  Patient Details  Name: [name] MRN: [id number] Date of Birth: [birth year] No data recorded  Encounter Date: 03/05/2018  End of Session - 03/05/18 1318    Visit Number  1    Number of Visits  1    Date for SLP Re-Evaluation  03/05/18    SLP Start Time  54    SLP Stop Time   1318    SLP Time Calculation (min)  48 min    Activity Tolerance  Patient tolerated treatment well       Past Medical History:  Diagnosis Date  . Anxiety   . Anxiety    panic anxiety disorder  . Arthritis   . Asthma    chronic asthmatic bronchitis  . Cancer [facility])    Desmoid tumor left forearm  . Depression   . ETOH abuse   . Heart murmur   . Insomnia   . Neuropathy     Past Surgical History:  Procedure Laterality Date  . ABDOMINAL HYSTERECTOMY    . ABDOMINAL HYSTERECTOMY  2007  . 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 ARM AMPUTATION AT SHOULDER Left   . CESAREAN SECTION    . [place]   x 2  . JOINT REPLACEMENT Right 2012   hip  . LUMBAR LAMINECTOMY/DECOMPRESSION MICRODISCECTOMY Left 04/16/2016   Procedure: LUMBAR LAMINECTOMY/DECOMPRESSION MICRODISCECTOMY 1 LEVEL;  Surgeon: Meade Maw, MD;  Location: [facility] ORS;  Service: Neurosurgery;  Laterality: Left;  Left L4-5 far lateral discectomy, left L4-5 laminoforaminotomy  . TOTAL HIP ARTHROPLASTY Left 09/17/2015   Procedure: TOTAL HIP ARTHROPLASTY;  Surgeon: Dereck Leep, MD;  Location: [facility] ORS;  Service: Orthopedics;  Laterality: Left;    There were no vitals filed for this visit.   Subjective:  Patient behavior: (alertness, ability to follow instructions, etc.):  The patient is alert, able to verbalize swallowing complaints, and follow directions.  Chief complaint: "I get full really fast"; chokes with  solids and feels as if foods get stuck   Objective:  Radiological Procedure: A videoflouroscopic evaluation of oral-preparatory, reflex initiation, and pharyngeal phases of the swallow was performed; as well as a screening of the upper esophageal phase.  I. POSTURE: Upright in MBS chair  II. VIEW: Lateral  III. COMPENSATORY STRATEGIES: N/A  IV. BOLUSES ADMINISTERED:   Thin Liquid: 1 small, 4 rapid consecutive   Nectar-thick Liquid: 1 moderate   Honey-thick Liquid: DNT   Puree: 2 teaspoon presentations   Mechanical Soft: 1/4 graham cracker in applesauce V. RESULTS OF EVALUATION: A. ORAL PREPARATORY PHASE: (The lips, tongue, and velum are observed for strength and coordination)       **Overall Severity Rating: Within normal limits  B. SWALLOW INITIATION/REFLEX: (The reflex is normal if "triggered" by the time the bolus reached the base of the tongue)  **Overall Severity Rating: Mild; triggers at the valleculae  C. PHARYNGEAL PHASE: (Pharyngeal function is normal if the bolus shows rapid, smooth, and continuous transit through the pharynx and there is no pharyngeal residue after the swallow)  **Overall Severity Rating: Within normal limits  D. LARYNGEAL PENETRATION: (Material entering into the laryngeal inlet/vestibule but not aspirated) Transient- 3 of 5 thin liquid boluses  E. ASPIRATION: None  F. ESOPHAGEAL PHASE: (Screening of the upper esophagus): No abnormality within  the (limited) viewable cervical esophagus  ASSESSMENT: This 62 year old woman; recently diagnosed with Parkinson's disease and with complaint of difficulty swallowing solids and early satiety; is presenting with minimal oropharyngeal dysphagia characterized by delayed pharyngeal swallow initiation and transient laryngeal penetration.  Oral control of the bolus including oral hold, rotary mastication, and anterior to posterior transfer is within normal limits.    Aspects of the pharyngeal stage of swallowing  including tongue base retraction, hyolaryngeal excursion, epiglottic inversion, and duration/amplitude of UES opening are within normal limits.  There is no pharyngeal residue or tracheal aspiration.  The patient's complaints do not appear to be due to oropharyngeal swallow function.  The patient may benefit from referral to GI.  PLAN/RECOMMENDATIONS:   A. Diet: Regular   B. Swallowing Precautions: Continue with swallow strategies recommended by the home health SLP   C. Recommended consultation to: GI   D. Therapy recommendations: further speech therapy is not indicated at this time   E. Results and recommendations were discussed with the patient immediately following the study and the final report routed to the referring MD   Oropharyngeal dysphagia - Plan: DG OP Swallowing Func-Medicare/Speech Path, DG OP Swallowing Func-Medicare/Speech Path    Problem List Patient Active Problem List   Diagnosis Date Noted  . High anion gap metabolic acidosis 62/08/5595  . Overdose of antidepressant 07/03/2016  . S/P total hip arthroplasty 09/17/2015  . Bipolar I disorder, most recent episode mixed, severe with psychotic features ([place]) 07/03/2015  . Alcohol use disorder, severe, dependence ([place]) 01/26/2015  . Tobacco use disorder 01/26/2015  . Alcohol withdrawal ([place]) 01/08/2015  . 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 GAD (generalized anxiety disorder) 12/24/2013  . PTSD (post-traumatic stress disorder) 12/22/2013   [name], MS/CCC- SLP  [name] 03/05/2018, 1:19 PM  [name], [place], [address] Phone: [phone]   Fax:      Name: [name] MRN: [id number] Date of Birth: January 29, 1956

## 2018-04-07 ENCOUNTER — Emergency Department: Payer: Medicare Other

## 2018-04-07 ENCOUNTER — Other Ambulatory Visit: Payer: Self-pay

## 2018-04-07 ENCOUNTER — Emergency Department
Admission: EM | Admit: 2018-04-07 | Discharge: 2018-04-07 | Disposition: A | Discharge status: Home / Self Care | Payer: Medicare Other | Attending: Emergency Medicine | Admitting: Emergency Medicine

## 2018-04-07 ENCOUNTER — Encounter: Payer: Self-pay | Admitting: Emergency Medicine

## 2018-04-07 DIAGNOSIS — Z96643 Presence of artificial hip joint, bilateral: Secondary | ICD-10-CM | POA: Insufficient documentation

## 2018-04-07 DIAGNOSIS — J45909 Unspecified asthma, uncomplicated: Secondary | ICD-10-CM | POA: Insufficient documentation

## 2018-04-07 DIAGNOSIS — G8929 Other chronic pain: Secondary | ICD-10-CM | POA: Diagnosis not present

## 2018-04-07 DIAGNOSIS — G20 Parkinson's disease: Secondary | ICD-10-CM | POA: Insufficient documentation

## 2018-04-07 DIAGNOSIS — M545 Low back pain: Secondary | ICD-10-CM | POA: Diagnosis present

## 2018-04-07 DIAGNOSIS — F17210 Nicotine dependence, cigarettes, uncomplicated: Secondary | ICD-10-CM | POA: Diagnosis not present

## 2018-04-07 DIAGNOSIS — S39012A Strain of muscle, fascia and tendon of lower back, initial encounter: Secondary | ICD-10-CM

## 2018-04-07 DIAGNOSIS — Z79899 Other long term (current) drug therapy: Secondary | ICD-10-CM | POA: Diagnosis not present

## 2018-04-07 DIAGNOSIS — M542 Cervicalgia: Secondary | ICD-10-CM | POA: Insufficient documentation

## 2018-04-07 LAB — CBC
HCT: 43.2 % (ref 36.0–46.0)
Hemoglobin: 14.7 g/dL (ref 12.0–15.0)
MCH: 32.8 pg (ref 26.0–34.0)
MCHC: 34 g/dL (ref 30.0–36.0)
MCV: 96.4 fL (ref 80.0–100.0)
PLATELETS: 245 10*3/uL (ref 150–400)
RBC: 4.48 MIL/uL (ref 3.87–5.11)
RDW: 14.6 % (ref 11.5–15.5)
WBC: 10.8 10*3/uL — ABNORMAL HIGH (ref 4.0–10.5)
nRBC: 0 % (ref 0.0–0.2)

## 2018-04-07 LAB — URINALYSIS, COMPLETE (UACMP) WITH MICROSCOPIC
Bacteria, UA: NONE SEEN
Bilirubin Urine: NEGATIVE
GLUCOSE, UA: NEGATIVE mg/dL
Ketones, ur: 5 mg/dL — AB
Leukocytes, UA: NEGATIVE
NITRITE: NEGATIVE
PH: 7 (ref 5.0–8.0)
PROTEIN: 30 mg/dL — AB
SPECIFIC GRAVITY, URINE: 1.02 (ref 1.005–1.030)

## 2018-04-07 LAB — BASIC METABOLIC PANEL
ANION GAP: 9 (ref 5–15)
BUN: 9 mg/dL (ref 8–23)
CALCIUM: 9.1 mg/dL (ref 8.9–10.3)
CO2: 25 mmol/L (ref 22–32)
CREATININE: 0.66 mg/dL (ref 0.44–1.00)
Chloride: 106 mmol/L (ref 98–111)
GFR calc Af Amer: >60 mL/min (ref >60)
GFR calc non Af Amer: >60 mL/min (ref >60)
GLUCOSE: 99 mg/dL (ref 70–99)
Potassium: 3.7 mmol/L (ref 3.5–5.1)
Sodium: 140 mmol/L (ref 135–145)

## 2018-04-07 LAB — HEPATIC FUNCTION PANEL
ALBUMIN: 3.9 g/dL (ref 3.5–5.0)
ALK PHOS: 51 U/L (ref 38–126)
ALT: 5 U/L (ref 0–44)
AST: 16 U/L (ref 15–41)
Bilirubin, Direct: 0.1 mg/dL (ref 0.0–0.2)
Indirect Bilirubin: 0.6 mg/dL (ref 0.3–0.9)
TOTAL PROTEIN: 6.7 g/dL (ref 6.5–8.1)
Total Bilirubin: 0.7 mg/dL (ref 0.3–1.2)

## 2018-04-07 LAB — VALPROIC ACID LEVEL: Valproic Acid Lvl: <10 ug/mL — ABNORMAL LOW (ref 50.0–100.0)

## 2018-04-07 LAB — ETHANOL: Alcohol, Ethyl (B): <10 mg/dL (ref <10)

## 2018-04-07 LAB — TROPONIN I: Troponin I: <0.03 ng/mL (ref <0.03)

## 2018-04-07 MED ORDER — SODIUM CHLORIDE 0.9 % IV BOLUS
1000.0000 mL | Freq: Once | INTRAVENOUS | Status: AC
  Administered 2018-04-07: 1000 mL via INTRAVENOUS

## 2018-04-07 MED ORDER — HYDROCODONE-ACETAMINOPHEN 5-325 MG PO TABS
1.0000 | ORAL_TABLET | Freq: Once | ORAL | Status: AC
  Administered 2018-04-07: 1 via ORAL
  Filled 2018-04-07: qty 1

## 2018-04-07 MED ORDER — HYDROCODONE-ACETAMINOPHEN 5-300 MG PO TABS: 1.0000 | ORAL_TABLET | Freq: Three times a day (TID) | ORAL | 0 refills | Status: AC | PRN

## 2018-04-07 MED ORDER — FENTANYL CITRATE (PF) 100 MCG/2ML IJ SOLN
50.0000 ug | Freq: Once | INTRAMUSCULAR | Status: AC
  Administered 2018-04-07: 50 ug via INTRAVENOUS
  Filled 2018-04-07: qty 2

## 2018-04-07 NOTE — ED Notes (Signed)
Pt ambulatory with assistance, MD aware

## 2018-04-07 NOTE — Discharge Instructions (Signed)
Use your cane at all times, return to the emergency room for new or worrisome symptoms including increased pain, weakness, or other concerns.  Follow-up with your doctor tomorrow.  Do not drink alcohol.

## 2018-04-07 NOTE — ED Triage Notes (Signed)
Pt arrived via EMS from home with reports of 3 falls in the past 24 hours, pt c/o low back pain 10/10 and neck pain.   Pt also reports feeling dizzy prior to falling.   Pt recently diagnosed with Parkinson's Disease in August and is being followed at [facility].  Pt alert and oriented on arrival to ED.

## 2018-04-07 NOTE — ED Provider Notes (Addendum)
[facility] Emergency Department Provider Note  ____________________________________________   I have reviewed the triage vital signs and the nursing notes. Where available I have reviewed prior notes and, if possible and indicated, outside hospital notes.    HISTORY  Chief Complaint Fall; Back Pain; and Neck Pain    HPI [name] is a 62 y.o. female who has a history of EtOH abuse Parkinson's, status post remote amputation of the left arm, history of frequent falls, states that she is supposed to be using a cane but does not want to, she states she fell couple times yesterday.  Her whole body hurts she states.  Including, her neck, and her back.  Mostly lower back.  She has chronic lower back pain, but states it is worse since she fell.  She did not have any syncopal symptoms but she states she felt lightheaded.  She states she is does still drink alcohol but is been a couple days since she last drank anything.  She denies history of alcohol withdrawal.  She denies severe headache.  She denies any focal numbness weakness incontinence of bowel or bladder, or abdominal pain.  She states her entire body hurts from falling however.     Past Medical History:  Diagnosis Date  . Anxiety   . Anxiety    panic anxiety disorder  . Arthritis   . Asthma    chronic asthmatic bronchitis  . Cancer [facility])    Desmoid tumor left forearm  . Depression   . ETOH abuse   . Heart murmur   . Insomnia   . Neuropathy     Patient Active Problem List   Diagnosis Date Noted  . High anion gap metabolic acidosis 34/19/3790  . Overdose of antidepressant 07/03/2016  . S/P total hip arthroplasty 09/17/2015  . Bipolar I disorder, most recent episode mixed, severe with psychotic features ([place]) 07/03/2015  . Alcohol use disorder, severe, dependence ([place]) 01/26/2015  . Tobacco use disorder 01/26/2015  . Alcohol withdrawal ([place]) 01/08/2015  . 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 GAD (generalized anxiety disorder) 12/24/2013  . PTSD (post-traumatic stress disorder) 12/22/2013    Past Surgical History:  Procedure Laterality Date  . ABDOMINAL HYSTERECTOMY    . ABDOMINAL HYSTERECTOMY  2007  . 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 ARM AMPUTATION AT SHOULDER Left   . CESAREAN SECTION    . [place]   x 2  . JOINT REPLACEMENT Right 2012   hip  . LUMBAR LAMINECTOMY/DECOMPRESSION MICRODISCECTOMY Left 04/16/2016   Procedure: LUMBAR LAMINECTOMY/DECOMPRESSION MICRODISCECTOMY 1 LEVEL;  Surgeon: Meade Maw, MD;  Location: [facility] ORS;  Service: Neurosurgery;  Laterality: Left;  Left L4-5 far lateral discectomy, left L4-5 laminoforaminotomy  . TOTAL HIP ARTHROPLASTY Left 09/17/2015   Procedure: TOTAL HIP ARTHROPLASTY;  Surgeon: Dereck Leep, MD;  Location: [facility] ORS;  Service: Orthopedics;  Laterality: Left;    Prior to Admission medications   Medication Sig Start Date End Date Taking? Authorizing Provider  asenapine (SAPHRIS) 5 MG SUBL 24 hr tablet Place 2 tablets (10 mg total) under the tongue at bedtime. 05/21/17   Pucilowska, Herma Ard B, MD  divalproex (DEPAKOTE) 500 MG DR tablet Take 1 tablet (500 mg total) by mouth every 12 (twelve) hours. 05/21/17   Pucilowska, Herma Ard B, MD  folic acid (FOLVITE) 1 MG tablet Take 1 tablet (1 mg total) by mouth daily. 05/21/17  Pucilowska, [name], MD  gabapentin (NEURONTIN) 300 MG capsule Take 3 capsules (900 mg total) by mouth 3 (three) times daily. 05/21/17   Pucilowska, Wardell Honour, MD  Multiple Vitamin (MULTIVITAMIN WITH MINERALS) TABS tablet Take 1 tablet by mouth daily. 05/21/17   Pucilowska, [name], MD  pantoprazole (PROTONIX) 40 MG tablet Take 1 tablet (40 mg total) by mouth daily. 05/22/17   Pucilowska, Herma Ard B, MD  prochlorperazine (COMPAZINE) 10 MG tablet Take 1 tablet (10 mg total) by mouth every 6 (six) hours as needed for nausea or vomiting.  05/21/17   Pucilowska, Herma Ard B, MD  propranolol (INDERAL) 10 MG tablet Take 1 tablet (10 mg total) by mouth 3 (three) times daily. 05/21/17   Pucilowska, [name], MD  senna-docusate (SENOKOT-S) 8.6-50 MG tablet Take 1 tablet by mouth 2 (two) times daily as needed for mild constipation. 05/21/17   Pucilowska, Herma Ard B, MD  thiamine 100 MG tablet Take 1 tablet (100 mg total) by mouth daily. 05/21/17   Pucilowska, Herma Ard B, MD  traZODone (DESYREL) 100 MG tablet Take 2 tablets (200 mg total) by mouth at bedtime. 05/21/17   Pucilowska, Herma Ard B, MD  venlafaxine XR (EFFEXOR-XR) 150 MG 24 hr capsule Take 1 capsule (150 mg total) by mouth daily with breakfast. 05/21/17   Pucilowska, [name], MD    Allergies Benadryl [diphenhydramine]; Benadryl [diphenhydramine]; Cephalosporins; Codeine; and Codeine  Family History  Problem Relation Age of Onset  . COPD Mother     Social History Social History   Tobacco Use  . Smoking status: Current Every Day Smoker    Packs/day: 0.25    Types: Cigarettes  . Smokeless tobacco: Never Used  Substance Use Topics  . Alcohol use: Yes    Comment: 2 40oz beers daily  . Drug use: No    Comment: Patient denies     Review of Systems Constitutional: No fever/chills Eyes: No visual changes. ENT: No sore throat. No stiff neck no neck pain Cardiovascular: Denies chest pain. Respiratory: Denies shortness of breath. Gastrointestinal:   no vomiting.  No diarrhea.  No constipation. Genitourinary: Negative for dysuria. Musculoskeletal: Negative lower extremity swelling Skin: Negative for rash. Neurological: Negative for severe headaches, focal weakness or numbness.   ____________________________________________   PHYSICAL EXAM:  VITAL SIGNS: ED Triage Vitals [04/07/18 1151]  Enc Vitals Group     BP 138/64     Pulse Rate 65     Resp 16     Temp 98.1 F (36.7 C)     Temp Source Oral     SpO2 91 %     Weight 142 lb (64.4 kg)     Height 5\' 5"   (1.651 m)     Head Circumference      Peak Flow      Pain Score 10     Pain Loc      Pain Edu?      Excl. in [place]?     Constitutional: Alert and oriented. Well appearing and in no acute distress.  Chatting and pleasant with me. Eyes: Conjunctivae are normal Head: Atraumatic HEENT: No congestion/rhinnorhea. Mucous membranes are moist.  Oropharynx non-erythematous Neck:   Seem to range her neck with absolutely no evidence of discomfort looking up and down and talking to me after take off her c-collar, she has even light touch to the skin of her neck anywhere in the trapezius muscles causes her to yell out.  This includes crossing the midline although she does not have  specific midline tenderness meningismus, no masses, no stridor Cardiovascular: Normal rate, regular Shelly. Grossly normal heart sounds.  Good peripheral circulation. Respiratory: Normal respiratory effort.  No retractions. Lungs CTAB. Abdominal: Soft and nontender. No distention. No guarding no rebound Back: Patient states that her entire low back hurts, and is tender to palpation everywhere I touch there is no midline tenderness there are no lesions noted. there is no CVA tenderness  Musculoskeletal: No lower extremity tenderness, no upper extremity tenderness. No joint effusions, no DVT signs strong distal pulses no edema Neurologic:  Normal speech and language. No gross focal neurologic deficits are appreciated.  Skin:  Skin is warm, dry and intact. No rash noted. Psychiatric: Mood and affect are normal. Speech and behavior are normal.  ____________________________________________   LABS (all labs ordered are listed, but only abnormal results are displayed)  Labs Reviewed  CBC - Abnormal; Notable for the following components:      Result Value   WBC 10.8 (*)    All other components within normal limits  BASIC METABOLIC PANEL  URINALYSIS, COMPLETE (UACMP) WITH MICROSCOPIC  ETHANOL  TROPONIN I  VALPROIC ACID LEVEL     Pertinent labs  results that were available during my care of the patient were reviewed by me and considered in my medical decision making (see chart for details). ____________________________________________  EKG  I personally interpreted any EKGs ordered by me or triage Sinus Shelly rate 53 bpm no acute ST elevation or depression normal axis ____________________________________________  RADIOLOGY  Pertinent labs & imaging results that were available during my care of the patient were reviewed by me and considered in my medical decision making (see chart for details). If possible, patient and/or family made aware of any abnormal findings.  No results found. ____________________________________________    PROCEDURES  Procedure(s) performed: None  Procedures  Critical Care performed: None  ____________________________________________   INITIAL IMPRESSION / ASSESSMENT AND PLAN / ED COURSE  Pertinent labs & imaging results that were available during my care of the patient were reviewed by me and considered in my medical decision making (see chart for details).  Patient with diffuse aches and pains after falling at home.  She did never passed out she states.  Patient does have a history of EtOH poor gait and Parkinson's disease, and supposed to be using a cane.  She is quite well-appearing actually.  There is some element of I think exaggeration to her symptoms.  For example, even light touch of the skin causes her to yell.  However, we will obtain imaging as a precaution because I cannot clinically rule things out in this context.  We will give her some pain medications check for alcohol.  Patient has had alcohol ketoacidosis in the past but there is no evidence of this clinically at this time but we will check EtOH level, and basic blood work etc.  She is on valproic acid we will check a valproic acid level.  ----------------------------------------- 3:02 PM on  04/07/2018 -----------------------------------------  And is in no acute distress she has no evidence of discomfort and thus we asked her to move.  She states she does not want to try to walk.  She states she does not want to go home because she wants to stay here and get IV pain medications.  X-rays are negative including CT of the head.  Able to ambulate.  We did discuss admission to the hospital she and her husband prefer to go home.  He was sent  home with pain medications return precaution follow-up given understood.   ____________________________________________   FINAL CLINICAL IMPRESSION(S) / ED DIAGNOSES  Final diagnoses:  None      This chart was dictated using voice recognition software.  Despite best efforts to proofread,  errors can occur which can change meaning.      Schuyler Amor, MD 04/07/18 1228    Schuyler Amor, MD 04/07/18 [phone]

## 2018-04-26 ENCOUNTER — Encounter: Payer: Self-pay | Admitting: *Deleted

## 2018-04-27 ENCOUNTER — Ambulatory Visit: Payer: Medicare Other | Admitting: Certified Registered Nurse Anesthetist

## 2018-04-27 ENCOUNTER — Ambulatory Visit
Admission: RE | Admit: 2018-04-27 | Discharge: 2018-04-27 | Disposition: A | Discharge status: Home / Self Care | Payer: Medicare Other | Source: Ambulatory Visit | Attending: Internal Medicine | Admitting: Internal Medicine

## 2018-04-27 ENCOUNTER — Encounter
Admission: RE | Disposition: A | Discharge status: Home / Self Care | Payer: Self-pay | Source: Ambulatory Visit | Attending: Internal Medicine

## 2018-04-27 ENCOUNTER — Encounter: Payer: Self-pay | Admitting: Certified Registered Nurse Anesthetist

## 2018-04-27 DIAGNOSIS — Z7982 Long term (current) use of aspirin: Secondary | ICD-10-CM | POA: Insufficient documentation

## 2018-04-27 DIAGNOSIS — F419 Anxiety disorder, unspecified: Secondary | ICD-10-CM | POA: Insufficient documentation

## 2018-04-27 DIAGNOSIS — F319 Bipolar disorder, unspecified: Secondary | ICD-10-CM | POA: Diagnosis not present

## 2018-04-27 DIAGNOSIS — R1312 Dysphagia, oropharyngeal phase: Secondary | ICD-10-CM | POA: Insufficient documentation

## 2018-04-27 DIAGNOSIS — Z96643 Presence of artificial hip joint, bilateral: Secondary | ICD-10-CM | POA: Insufficient documentation

## 2018-04-27 DIAGNOSIS — M1990 Unspecified osteoarthritis, unspecified site: Secondary | ICD-10-CM | POA: Insufficient documentation

## 2018-04-27 DIAGNOSIS — J449 Chronic obstructive pulmonary disease, unspecified: Secondary | ICD-10-CM | POA: Insufficient documentation

## 2018-04-27 DIAGNOSIS — R933 Abnormal findings on diagnostic imaging of other parts of digestive tract: Secondary | ICD-10-CM | POA: Diagnosis not present

## 2018-04-27 DIAGNOSIS — G20 Parkinson's disease: Secondary | ICD-10-CM | POA: Insufficient documentation

## 2018-04-27 DIAGNOSIS — G629 Polyneuropathy, unspecified: Secondary | ICD-10-CM | POA: Insufficient documentation

## 2018-04-27 DIAGNOSIS — Z79899 Other long term (current) drug therapy: Secondary | ICD-10-CM | POA: Diagnosis not present

## 2018-04-27 HISTORY — PX: ESOPHAGOGASTRODUODENOSCOPY (EGD) WITH PROPOFOL: SHX5813

## 2018-04-27 HISTORY — DX: Bipolar disorder, unspecified: F31.9

## 2018-04-27 SURGERY — ESOPHAGOGASTRODUODENOSCOPY (EGD) WITH PROPOFOL
Anesthesia: General

## 2018-04-27 MED ORDER — PROPOFOL 10 MG/ML IV BOLUS
INTRAVENOUS | Status: AC
  Filled 2018-04-27: qty 20

## 2018-04-27 MED ORDER — LIDOCAINE HCL (CARDIAC) PF 100 MG/5ML IV SOSY
PREFILLED_SYRINGE | INTRAVENOUS | Status: DC | PRN
  Administered 2018-04-27: 50 mg via INTRAVENOUS

## 2018-04-27 MED ORDER — SODIUM CHLORIDE 0.9 % IV SOLN
INTRAVENOUS | Status: DC
  Administered 2018-04-27: 1000 mL via INTRAVENOUS

## 2018-04-27 MED ORDER — LIDOCAINE HCL (PF) 2 % IJ SOLN
INTRAMUSCULAR | Status: AC
  Filled 2018-04-27: qty 10

## 2018-04-27 MED ORDER — PROPOFOL 500 MG/50ML IV EMUL
INTRAVENOUS | Status: DC | PRN
  Administered 2018-04-27: 140 ug/kg/min via INTRAVENOUS

## 2018-04-27 MED ORDER — PROPOFOL 10 MG/ML IV BOLUS
INTRAVENOUS | Status: DC | PRN
  Administered 2018-04-27: 90 mg via INTRAVENOUS

## 2018-04-27 NOTE — Interval H&P Note (Signed)
History and Physical Interval Note:  04/27/2018 2:31 PM  [name]  has presented today for surgery, with the diagnosis of PHARYNGOESOPHAGEAL DYSPHAGIA  The various methods of treatment have been discussed with the patient and family. After consideration of risks, benefits and other options for treatment, the patient has consented to  Procedure(s): ESOPHAGOGASTRODUODENOSCOPY (EGD) WITH PROPOFOL (N/A) as a surgical intervention .  The patient's history has been reviewed, patient examined, no change in status, stable for surgery.  I have reviewed the patient's chart and labs.  Questions were answered to the patient's satisfaction.     [place], [place]

## 2018-04-27 NOTE — Anesthesia Post-op Follow-up Note (Signed)
Anesthesia QCDR form completed.        

## 2018-04-27 NOTE — H&P (Signed)
Outpatient short stay form Pre-procedure 04/27/2018 2:26 PM Teodoro K. Alice Reichert, M.D.  Primary Physician: Isaiah Serge, M.D.  Reason for visit: Pharyngoesophageal dysphagia  History of present illness: 62 year old female with a history of Parkinson's disease presents with increased dysphagia for the past 3 months.  Patient has solid food dysphagia to the level of the cricoid thyroid cartilage.  Patient has regurgitation of solid food at times when they will not pass beyond the posterior pharynx.  No known history of significant gastroesophageal reflux disease.  No significant weight loss or hematemesis.    Current Facility-Administered Medications:  .  0.9 %  sodium chloride infusion, , Intravenous, Continuous, [place], Benay Pike, MD, Last Rate: 20 mL/hr at 04/27/18 1334  Facility-Administered Medications Ordered in Other Encounters:  .  lidocaine (cardiac) 100 mg/86mL (XYLOCAINE) injection 2%, , , Anesthesia Intra-op, Eben Burow, CRNA, 50 mg at 04/27/18 1426  Medications Prior to Admission  Medication Sig Dispense Refill Last Dose  . aspirin EC 81 MG tablet Take 81 mg by mouth daily.   04/27/2018 at Unknown time  . carbidopa-levodopa (SINEMET IR) 25-100 MG tablet Take 1 tablet by mouth 3 (three) times daily.   04/26/2018 at Unknown time  . clonazePAM (KLONOPIN) 0.5 MG tablet Take 0.5 mg by mouth 2 (two) times daily as needed for anxiety.   04/26/2018 at Unknown time  . famotidine (PEPCID) 20 MG tablet Take 20 mg by mouth 2 (two) times daily.   Past Week at Unknown time  . folic acid (FOLVITE) 1 MG tablet Take 1 tablet (1 mg total) by mouth daily. 30 tablet 1 Past Week at Unknown time  . gabapentin (NEURONTIN) 300 MG capsule Take 3 capsules (900 mg total) by mouth 3 (three) times daily. 270 capsule 1 04/27/2018 at Unknown time  . ibuprofen (ADVIL,MOTRIN) 200 MG tablet Take 600 mg by mouth every 8 (eight) hours as needed.   Past Week at Unknown time  . Multiple Vitamin (MULTIVITAMIN WITH  MINERALS) TABS tablet Take 1 tablet by mouth daily. 30 tablet 1 Past Week at Unknown time  . propranolol (INDERAL) 10 MG tablet Take 1 tablet (10 mg total) by mouth 3 (three) times daily. 90 tablet 1 04/26/2018 at Unknown time  . senna-docusate (SENOKOT-S) 8.6-50 MG tablet Take 1 tablet by mouth 2 (two) times daily as needed for mild constipation. 60 tablet 1 Past Week at Unknown time  . thiamine 100 MG tablet Take 1 tablet (100 mg total) by mouth daily. 30 tablet 1 Past Week at Unknown time  . traZODone (DESYREL) 150 MG tablet Take by mouth at bedtime.   04/26/2018 at Unknown time  . venlafaxine XR (EFFEXOR-XR) 150 MG 24 hr capsule Take 1 capsule (150 mg total) by mouth daily with breakfast. 30 capsule 1 04/27/2018 at Unknown time  . ergocalciferol (VITAMIN D2) 50000 units capsule Take 50,000 Units by mouth once a week.   Completed Course at Unknown time  . VITAMIN B1-B12 IM Inject 1,000 mcg into the muscle every 30 (thirty) days.   Completed Course at Unknown time     Allergies  Allergen Reactions  . Benadryl [Diphenhydramine] Hives and Other (See Comments)    Reaction:  Hyperactivity   . Benadryl [Diphenhydramine] Other (See Comments)    My jaw locked and I could not speak, like a stroke.  . Cephalosporins Hives  . Codeine Nausea And Vomiting  . Codeine Nausea Only    severe     Past Medical History:  Diagnosis Date  .  Anxiety   . Anxiety    panic anxiety disorder  . Arthritis   . Asthma    chronic asthmatic bronchitis  . Bipolar disorder ([place])   . Cancer [facility])    Desmoid tumor left forearm  . Depression   . ETOH abuse   . Heart murmur   . Insomnia   . Neuropathy     Review of systems:  Otherwise negative.    Physical Exam  Gen: Alert, oriented. Appears stated age.  HEENT: [place]/AT. PERRLA. Lungs: CTA, no wheezes. CV: RR nl S1, S2. Abd: soft, benign, no masses. BS+ Ext: No edema. Pulses 2+    Planned procedures: Proceed with EGD with possible biopsy and/or  esophageal dilatation. The patient understands the nature of the planned procedure, indications, risks, alternatives and potential complications including but not limited to bleeding, infection, perforation, damage to internal organs and possible oversedation/side effects from anesthesia. The patient agrees and gives consent to proceed.  Please refer to procedure notes for findings, recommendations and patient disposition/instructions.     Teodoro K. Alice Reichert, M.D. Gastroenterology 04/27/2018  2:26 PM

## 2018-04-27 NOTE — Op Note (Signed)
[facility] Gastroenterology Patient Name: [name] Procedure Date: 04/27/2018 2:16 PM MRN: [id number] Account #: [id number] Date of Birth: [birth year] Admit Type: Outpatient Age: 62 Room: [facility] ENDO ROOM 2 Gender: Female Note Status: Finalized Procedure:            Upper GI endoscopy Indications:          Oropharyngeal phase dysphagia, Abnormal cine-esophagram Providers:            Benay Pike. Alice Reichert MD, MD Referring MD:         Dion Body (Referring MD) Medicines:            Propofol per Anesthesia Complications:        No immediate complications. Procedure:            Pre-Anesthesia Assessment:                       - The risks and benefits of the procedure and the                        sedation options and risks were discussed with the                        patient. All questions were answered and informed                        consent was obtained.                       - Patient identification and proposed procedure were                        verified prior to the procedure by the nurse. The                        procedure was verified in the procedure room.                       - ASA Grade Assessment: III - A patient with severe                        systemic disease.                       - After reviewing the risks and benefits, the patient                        was deemed in satisfactory condition to undergo the                        procedure.                       After obtaining informed consent, the endoscope was                        passed under direct vision. Throughout the procedure,                        the patient's blood pressure, pulse, and oxygen  saturations were monitored continuously. The Endoscope                        was introduced through the mouth, and advanced to the                        third part of duodenum. The upper GI endoscopy was                        accomplished without  difficulty. The patient tolerated                        the procedure well. Findings:      No endoscopic abnormality was evident in the esophagus to explain the       patient's complaint of dysphagia.      The entire examined stomach was normal.      The examined duodenum was normal.      The exam was otherwise without abnormality. Impression:           - No endoscopic esophageal abnormality to explain                        patient's dysphagia.                       - Normal stomach.                       - Normal examined duodenum.                       - The examination was otherwise normal.                       - No specimens collected. Recommendation:       - Patient has a contact number available for                        emergencies. The signs and symptoms of potential                        delayed complications were discussed with the patient.                        Return to normal activities tomorrow. Written discharge                        instructions were provided to the patient.                       - Resume previous diet.                       - Continue present medications.                       - Return to physician assistant in 3 months.                       - Continue Speech therapy for swallowing function  improvement                       - The findings and recommendations were discussed with                        the patient and their family. Procedure Code(s):    --- Professional ---                       [phone], Esophagogastroduodenoscopy, flexible, transoral;                        diagnostic, including collection of specimen(s) by                        brushing or washing, when performed (separate procedure) Diagnosis Code(s):    --- Professional ---                       R93.3, Abnormal findings on diagnostic imaging of other                        parts of digestive tract                       R13.12, Dysphagia, oropharyngeal  phase CPT copyright 2018 [employer] Association. All rights reserved. The codes documented in this report are preliminary and upon coder review may  be revised to meet current compliance requirements. Efrain Sella MD, MD 04/27/2018 2:45:14 PM This report has been signed electronically. Number of Addenda: 0 Note Initiated On: 04/27/2018 2:16 PM      [facility]

## 2018-04-27 NOTE — Transfer of Care (Signed)
Immediate Anesthesia Transfer of Care Note  Patient: [name]  Procedure(s) Performed: ESOPHAGOGASTRODUODENOSCOPY (EGD) WITH PROPOFOL (N/A )  Patient Location: PACU and Endoscopy Unit  Anesthesia Type:General  Level of Consciousness: drowsy  Airway & Oxygen Therapy: Patient Spontanous Breathing and Patient connected to nasal cannula oxygen  Post-op Assessment: Report given to RN and Post -op Vital signs reviewed and stable  Post vital signs: Reviewed and stable  Last Vitals:  Vitals Value Taken Time  BP 85/36 04/27/2018  2:47 PM  Temp    Pulse 71 04/27/2018  2:48 PM  Resp 11 04/27/2018  2:48 PM  SpO2 90 % 04/27/2018  2:48 PM  Vitals shown include unvalidated device data.  Last Pain:  Vitals:   04/27/18 1311  TempSrc: Tympanic  PainSc: 0-No pain      Patients Stated Pain Goal: 0 (58/48/35 0757)  Complications: No apparent anesthesia complications

## 2018-04-27 NOTE — Anesthesia Preprocedure Evaluation (Addendum)
Anesthesia Evaluation  Patient identified by MRN, date of birth, ID band Patient awake    Reviewed: Allergy & Precautions, H&P , NPO status , Patient's Chart, lab work & pertinent test results  History of Anesthesia Complications (+) AWARENESS UNDER ANESTHESIA and history of anesthetic complications (pt remembers ETT coming out, felt like she couldn't breathe, very upsetting)  Airway Mallampati: III  TM Distance: >3 FB Neck ROM: full    Dental  (+) Poor Dentition, Chipped   Pulmonary asthma , Current Smoker,           Cardiovascular + Valvular Problems/Murmurs   Echo 04/11/16: NORMAL LEFT VENTRICULAR SYSTOLIC FUNCTION NORMAL RIGHT VENTRICULAR SYSTOLIC FUNCTION MILD MR, TR NO VALVULAR STENOSIS EF=60%   Neuro/Psych PSYCHIATRIC DISORDERS Anxiety Depression Bipolar Disorder negative neurological ROS  negative psych ROS   GI/Hepatic negative GI ROS, Neg liver ROS, (+)     substance abuse  alcohol use,   Endo/Other  negative endocrine ROS  Renal/GU negative Renal ROS  negative genitourinary   Musculoskeletal  (+) Arthritis ,   Abdominal   Peds  Hematology negative hematology ROS (+)   Anesthesia Other Findings Past Medical History: No date: Anxiety No date: Anxiety     Comment:  panic anxiety disorder No date: Arthritis No date: Asthma     Comment:  chronic asthmatic bronchitis No date: Bipolar disorder ([place]) No date: Cancer ([place])     Comment:  Desmoid tumor left forearm No date: Depression No date: ETOH abuse No date: Heart murmur No date: Insomnia No date: Neuropathy  Past Surgical History: No date: ABDOMINAL HYSTERECTOMY 2007: ABDOMINAL HYSTERECTOMY 1998: ARM AMPUTATION; Left     Comment:  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 No date: ARM AMPUTATION AT SHOULDER; Left No date: [place]: CESAREAN SECTION     Comment:  x 2 2012: JOINT REPLACEMENT; Right     Comment:  hip 04/16/2016:  LUMBAR LAMINECTOMY/DECOMPRESSION MICRODISCECTOMY; Left     Comment:  Procedure: LUMBAR LAMINECTOMY/DECOMPRESSION               MICRODISCECTOMY 1 LEVEL;  Surgeon: Meade Maw, MD;              Location: [facility] ORS;  Service: Neurosurgery;  Laterality:               Left;  Left L4-5 far lateral discectomy, left L4-5               laminoforaminotomy 09/17/2015: TOTAL HIP ARTHROPLASTY; Left     Comment:  Procedure: TOTAL HIP ARTHROPLASTY;  Surgeon: Dereck Leep, MD;  Location: [facility] ORS;  Service: Orthopedics;                Laterality: Left;  BMI    Body Mass Index:  23.63 kg/m      Reproductive/Obstetrics negative OB ROS                           Anesthesia Physical Anesthesia Plan  ASA: III  Anesthesia Plan: General   Post-op Pain Management:    Induction:   PONV Risk Score and Plan: Propofol infusion and TIVA  Airway Management Planned: Natural Airway and Nasal Cannula  Additional Equipment:   Intra-op Plan:   Post-operative Plan:   Informed Consent: I have reviewed the patients History and Physical, chart, labs and discussed the procedure including the risks,  benefits and alternatives for the proposed anesthesia with the patient or authorized representative who has indicated his/her understanding and acceptance.   Dental Advisory Given  Plan Discussed with: Anesthesiologist, CRNA and Surgeon  Anesthesia Plan Comments:         Anesthesia Quick Evaluation

## 2018-04-27 NOTE — Progress Notes (Signed)
Patient states "I haven't had a bowel movement in I don't know when, what am I suppose to do about that?" Denies talking with Dr. Alice Reichert about this issue today. Office notified to let Dr. Alice Reichert know, since procedure complete and he had already talked with patient. Office will call her back and she is aware of this as well as her family member that is present.

## 2018-04-27 NOTE — Interval H&P Note (Signed)
History and Physical Interval Note:  04/27/2018 2:30 PM  [name]  has presented today for surgery, with the diagnosis of PHARYNGOESOPHAGEAL DYSPHAGIA  The various methods of treatment have been discussed with the patient and family. After consideration of risks, benefits and other options for treatment, the patient has consented to  Procedure(s): ESOPHAGOGASTRODUODENOSCOPY (EGD) WITH PROPOFOL (N/A) as a surgical intervention .  The patient's history has been reviewed, patient examined, no change in status, stable for surgery.  I have reviewed the patient's chart and labs.  Questions were answered to the patient's satisfaction.     [place], [place]

## 2018-04-28 NOTE — Anesthesia Postprocedure Evaluation (Signed)
Anesthesia Post Note  Patient: [name]  Procedure(s) Performed: ESOPHAGOGASTRODUODENOSCOPY (EGD) WITH PROPOFOL (N/A )  Patient location during evaluation: PACU Anesthesia Type: General Level of consciousness: awake and alert Pain management: pain level controlled Vital Signs Assessment: post-procedure vital signs reviewed and stable Respiratory status: spontaneous breathing, nonlabored ventilation and respiratory function stable Cardiovascular status: blood pressure returned to baseline and stable Postop Assessment: no apparent nausea or vomiting Anesthetic complications: no     Last Vitals:  Vitals:   04/27/18 1517 04/27/18 1527  BP: (!) 99/55 100/61  Pulse: 78 72  Resp: 16 (!) 22  Temp:    SpO2: 95% 94%    Last Pain:  Vitals:   04/28/18 0758  TempSrc:   PainSc: 0-No pain                 Durenda Hurt

## 2018-04-29 ENCOUNTER — Encounter: Payer: Self-pay | Admitting: Internal Medicine

## 2018-05-04 ENCOUNTER — Other Ambulatory Visit: Payer: Self-pay | Admitting: Student

## 2018-05-04 DIAGNOSIS — M5441 Lumbago with sciatica, right side: Secondary | ICD-10-CM

## 2018-05-04 DIAGNOSIS — M5442 Lumbago with sciatica, left side: Principal | ICD-10-CM

## 2018-05-10 ENCOUNTER — Other Ambulatory Visit: Payer: Self-pay | Admitting: Neurology

## 2018-05-10 DIAGNOSIS — R296 Repeated falls: Secondary | ICD-10-CM

## 2018-05-16 ENCOUNTER — Ambulatory Visit
Admission: RE | Admit: 2018-05-16 | Discharge: 2018-05-16 | Disposition: A | Discharge status: Home / Self Care | Payer: Medicare Other | Source: Ambulatory Visit | Attending: Neurology | Admitting: Neurology

## 2018-05-16 DIAGNOSIS — R296 Repeated falls: Secondary | ICD-10-CM | POA: Diagnosis present

## 2018-05-22 ENCOUNTER — Ambulatory Visit
Admission: RE | Admit: 2018-05-22 | Discharge: 2018-05-22 | Disposition: A | Discharge status: Home / Self Care | Payer: Medicare Other | Source: Ambulatory Visit | Attending: Student | Admitting: Student

## 2018-05-22 DIAGNOSIS — M5441 Lumbago with sciatica, right side: Secondary | ICD-10-CM

## 2018-05-22 DIAGNOSIS — M5442 Lumbago with sciatica, left side: Principal | ICD-10-CM

## 2018-06-09 ENCOUNTER — Ambulatory Visit: Payer: Medicare Other

## 2018-07-26 ENCOUNTER — Other Ambulatory Visit: Payer: Self-pay

## 2018-07-26 ENCOUNTER — Emergency Department
Admission: EM | Admit: 2018-07-26 | Discharge: 2018-07-26 | Disposition: A | Discharge status: Home / Self Care | Payer: Medicare Other | Attending: Emergency Medicine | Admitting: Emergency Medicine

## 2018-07-26 DIAGNOSIS — J45909 Unspecified asthma, uncomplicated: Secondary | ICD-10-CM | POA: Diagnosis not present

## 2018-07-26 DIAGNOSIS — Z79899 Other long term (current) drug therapy: Secondary | ICD-10-CM | POA: Insufficient documentation

## 2018-07-26 DIAGNOSIS — Z7982 Long term (current) use of aspirin: Secondary | ICD-10-CM | POA: Diagnosis not present

## 2018-07-26 DIAGNOSIS — F319 Bipolar disorder, unspecified: Secondary | ICD-10-CM | POA: Diagnosis present

## 2018-07-26 DIAGNOSIS — Z86011 Personal history of benign neoplasm of the brain: Secondary | ICD-10-CM | POA: Diagnosis not present

## 2018-07-26 DIAGNOSIS — F3132 Bipolar disorder, current episode depressed, moderate: Secondary | ICD-10-CM | POA: Insufficient documentation

## 2018-07-26 DIAGNOSIS — F17210 Nicotine dependence, cigarettes, uncomplicated: Secondary | ICD-10-CM | POA: Insufficient documentation

## 2018-07-26 DIAGNOSIS — Z96642 Presence of left artificial hip joint: Secondary | ICD-10-CM | POA: Diagnosis not present

## 2018-07-26 LAB — COMPREHENSIVE METABOLIC PANEL
ALK PHOS: 70 U/L (ref 38–126)
ALT: <5 U/L (ref 0–44)
ANION GAP: 8 (ref 5–15)
AST: 13 U/L — ABNORMAL LOW (ref 15–41)
Albumin: 4.2 g/dL (ref 3.5–5.0)
BUN: 16 mg/dL (ref 8–23)
CALCIUM: 9.2 mg/dL (ref 8.9–10.3)
CHLORIDE: 104 mmol/L (ref 98–111)
CO2: 24 mmol/L (ref 22–32)
Creatinine, Ser: 0.74 mg/dL (ref 0.44–1.00)
GFR calc non Af Amer: >60 mL/min (ref >60)
Glucose, Bld: 99 mg/dL (ref 70–99)
POTASSIUM: 3.4 mmol/L — AB (ref 3.5–5.1)
SODIUM: 136 mmol/L (ref 135–145)
Total Bilirubin: 0.7 mg/dL (ref 0.3–1.2)
Total Protein: 7.3 g/dL (ref 6.5–8.1)

## 2018-07-26 LAB — CBC
HEMATOCRIT: 43.4 % (ref 36.0–46.0)
Hemoglobin: 14.6 g/dL (ref 12.0–15.0)
MCH: 31.7 pg (ref 26.0–34.0)
MCHC: 33.6 g/dL (ref 30.0–36.0)
MCV: 94.3 fL (ref 80.0–100.0)
NRBC: 0 % (ref 0.0–0.2)
Platelets: 314 10*3/uL (ref 150–400)
RBC: 4.6 MIL/uL (ref 3.87–5.11)
RDW: 14.3 % (ref 11.5–15.5)
WBC: 10 10*3/uL (ref 4.0–10.5)

## 2018-07-26 LAB — ETHANOL: Alcohol, Ethyl (B): <10 mg/dL (ref <10)

## 2018-07-26 LAB — SALICYLATE LEVEL

## 2018-07-26 LAB — ACETAMINOPHEN LEVEL: Acetaminophen (Tylenol), Serum: <10 ug/mL — ABNORMAL LOW (ref 10–30)

## 2018-07-26 NOTE — ED Notes (Signed)
SOC  DONE  REPORT ON  CHART
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
## 2018-07-26 NOTE — ED Notes (Signed)
Patient signed a printable discharge sheet placed in black box for medical records
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
## 2018-07-26 NOTE — ED Notes (Signed)
Hourly rounding reveals patient sleeping in room. No complaints, stable, in no acute distress. Q15 minute rounds and monitoring via Security to continue. 

## 2018-07-26 NOTE — ED Notes (Signed)
Pt awakened for consult with SOC.

## 2018-07-26 NOTE — ED Notes (Signed)
Report to SOC MD 

## 2018-07-26 NOTE — ED Notes (Signed)
Patient eating breakfast. °

## 2018-07-26 NOTE — ED Notes (Signed)
Patient discharged home to wife, patient received discharge papers. Patient received belongings and verbalized she has received all of her belongings. Patient appropriate and cooperative, Denies SI/HI AVH. Vital signs taken. NAD noted.

## 2018-07-26 NOTE — ED Provider Notes (Signed)
The patient has been evaluated by [name] psychiatry.  Patient is clinically stable.  Not felt to be a danger to self or others.  No SI or Hi.  No indication for inpatient psychiatric admission at this time.  Appropriate for continued outpatient therapy.    Merlyn Lot, MD 07/26/18 [phone]
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
## 2018-08-06 ENCOUNTER — Emergency Department: Payer: Medicare Other

## 2018-08-06 ENCOUNTER — Observation Stay
Admission: EM | Admit: 2018-08-06 | Discharge: 2018-08-09 | Disposition: A | Discharge status: Home / Self Care | Payer: Medicare Other | Attending: Internal Medicine | Admitting: Internal Medicine

## 2018-08-06 ENCOUNTER — Encounter: Payer: Self-pay | Admitting: Emergency Medicine

## 2018-08-06 DIAGNOSIS — Z96643 Presence of artificial hip joint, bilateral: Secondary | ICD-10-CM | POA: Insufficient documentation

## 2018-08-06 DIAGNOSIS — G4700 Insomnia, unspecified: Secondary | ICD-10-CM | POA: Diagnosis not present

## 2018-08-06 DIAGNOSIS — F4310 Post-traumatic stress disorder, unspecified: Secondary | ICD-10-CM | POA: Diagnosis not present

## 2018-08-06 DIAGNOSIS — F1010 Alcohol abuse, uncomplicated: Secondary | ICD-10-CM | POA: Diagnosis not present

## 2018-08-06 DIAGNOSIS — Y792 Prosthetic and other implants, materials and accessory orthopedic devices associated with adverse incidents: Secondary | ICD-10-CM | POA: Insufficient documentation

## 2018-08-06 DIAGNOSIS — T84021A Dislocation of internal left hip prosthesis, initial encounter: Secondary | ICD-10-CM | POA: Diagnosis not present

## 2018-08-06 DIAGNOSIS — S73005A Unspecified dislocation of left hip, initial encounter: Secondary | ICD-10-CM

## 2018-08-06 DIAGNOSIS — F410 Panic disorder [episodic paroxysmal anxiety] without agoraphobia: Secondary | ICD-10-CM | POA: Diagnosis not present

## 2018-08-06 DIAGNOSIS — F17210 Nicotine dependence, cigarettes, uncomplicated: Secondary | ICD-10-CM | POA: Diagnosis not present

## 2018-08-06 DIAGNOSIS — W19XXXA Unspecified fall, initial encounter: Secondary | ICD-10-CM | POA: Insufficient documentation

## 2018-08-06 DIAGNOSIS — F319 Bipolar disorder, unspecified: Secondary | ICD-10-CM | POA: Diagnosis not present

## 2018-08-06 DIAGNOSIS — E871 Hypo-osmolality and hyponatremia: Secondary | ICD-10-CM | POA: Insufficient documentation

## 2018-08-06 DIAGNOSIS — Z79899 Other long term (current) drug therapy: Secondary | ICD-10-CM | POA: Diagnosis not present

## 2018-08-06 DIAGNOSIS — G629 Polyneuropathy, unspecified: Secondary | ICD-10-CM | POA: Insufficient documentation

## 2018-08-06 DIAGNOSIS — M1990 Unspecified osteoarthritis, unspecified site: Secondary | ICD-10-CM | POA: Diagnosis not present

## 2018-08-06 DIAGNOSIS — J449 Chronic obstructive pulmonary disease, unspecified: Secondary | ICD-10-CM | POA: Diagnosis not present

## 2018-08-06 DIAGNOSIS — Z7982 Long term (current) use of aspirin: Secondary | ICD-10-CM | POA: Diagnosis not present

## 2018-08-06 DIAGNOSIS — Z8603 Personal history of neoplasm of uncertain behavior: Secondary | ICD-10-CM | POA: Diagnosis not present

## 2018-08-06 DIAGNOSIS — S73005S Unspecified dislocation of left hip, sequela: Secondary | ICD-10-CM

## 2018-08-06 DIAGNOSIS — R011 Cardiac murmur, unspecified: Secondary | ICD-10-CM | POA: Insufficient documentation

## 2018-08-06 DIAGNOSIS — E878 Other disorders of electrolyte and fluid balance, not elsewhere classified: Secondary | ICD-10-CM | POA: Diagnosis not present

## 2018-08-06 LAB — CBC WITH DIFFERENTIAL/PLATELET
Abs Immature Granulocytes: 0.12 10*3/uL — ABNORMAL HIGH (ref 0.00–0.07)
BASOS PCT: 1 %
Basophils Absolute: 0.1 10*3/uL (ref 0.0–0.1)
EOS ABS: 0.2 10*3/uL (ref 0.0–0.5)
EOS PCT: 2 %
HEMATOCRIT: 41.1 % (ref 36.0–46.0)
Hemoglobin: 14.2 g/dL (ref 12.0–15.0)
IMMATURE GRANULOCYTES: 1 %
Lymphocytes Relative: 29 %
Lymphs Abs: 2.6 10*3/uL (ref 0.7–4.0)
MCH: 32 pg (ref 26.0–34.0)
MCHC: 34.5 g/dL (ref 30.0–36.0)
MCV: 92.6 fL (ref 80.0–100.0)
MONO ABS: 0.7 10*3/uL (ref 0.1–1.0)
MONOS PCT: 8 %
NEUTROS PCT: 59 %
Neutro Abs: 5.3 10*3/uL (ref 1.7–7.7)
PLATELETS: 208 10*3/uL (ref 150–400)
RBC: 4.44 MIL/uL (ref 3.87–5.11)
RDW: 13.8 % (ref 11.5–15.5)
WBC: 9 10*3/uL (ref 4.0–10.5)
nRBC: 0 % (ref 0.0–0.2)

## 2018-08-06 LAB — COMPREHENSIVE METABOLIC PANEL
ALT: 6 U/L (ref 0–44)
ANION GAP: 10 (ref 5–15)
AST: 16 U/L (ref 15–41)
Albumin: 4 g/dL (ref 3.5–5.0)
Alkaline Phosphatase: 64 U/L (ref 38–126)
BILIRUBIN TOTAL: 0.7 mg/dL (ref 0.3–1.2)
BUN: 15 mg/dL (ref 8–23)
CHLORIDE: 95 mmol/L — AB (ref 98–111)
CO2: 23 mmol/L (ref 22–32)
Calcium: 8.8 mg/dL — ABNORMAL LOW (ref 8.9–10.3)
Creatinine, Ser: 0.92 mg/dL (ref 0.44–1.00)
Glucose, Bld: 100 mg/dL — ABNORMAL HIGH (ref 70–99)
POTASSIUM: 3.8 mmol/L (ref 3.5–5.1)
Sodium: 128 mmol/L — ABNORMAL LOW (ref 135–145)
TOTAL PROTEIN: 6.9 g/dL (ref 6.5–8.1)

## 2018-08-06 LAB — ETHANOL

## 2018-08-06 MED ORDER — FAMOTIDINE 20 MG PO TABS
20.0000 mg | ORAL_TABLET | Freq: Two times a day (BID) | ORAL | Status: DC
  Administered 2018-08-06 – 2018-08-09 (×5): 20 mg via ORAL
  Filled 2018-08-06 (×5): qty 1

## 2018-08-06 MED ORDER — ADULT MULTIVITAMIN W/MINERALS CH
1.0000 | ORAL_TABLET | Freq: Every day | ORAL | Status: DC
  Administered 2018-08-07 – 2018-08-09 (×3): 1 via ORAL
  Filled 2018-08-06 (×3): qty 1

## 2018-08-06 MED ORDER — GABAPENTIN 100 MG PO CAPS
200.0000 mg | ORAL_CAPSULE | Freq: Three times a day (TID) | ORAL | Status: DC
  Administered 2018-08-06 – 2018-08-07 (×3): 200 mg via ORAL
  Filled 2018-08-06 (×3): qty 2

## 2018-08-06 MED ORDER — FENTANYL CITRATE (PF) 100 MCG/2ML IJ SOLN
50.0000 ug | Freq: Once | INTRAMUSCULAR | Status: AC
  Administered 2018-08-06: 50 ug via INTRAVENOUS
  Filled 2018-08-06: qty 2

## 2018-08-06 MED ORDER — PROPRANOLOL HCL 20 MG PO TABS
10.0000 mg | ORAL_TABLET | Freq: Three times a day (TID) | ORAL | Status: DC
  Administered 2018-08-06 – 2018-08-09 (×8): 10 mg via ORAL
  Filled 2018-08-06 (×8): qty 1

## 2018-08-06 MED ORDER — LORAZEPAM 2 MG/ML IJ SOLN: 1.0000 mg | Freq: Four times a day (QID) | INTRAMUSCULAR | Status: DC | PRN

## 2018-08-06 MED ORDER — VITAMIN B-1 100 MG PO TABS
100.0000 mg | ORAL_TABLET | Freq: Every day | ORAL | Status: DC
  Administered 2018-08-06 – 2018-08-09 (×4): 100 mg via ORAL
  Filled 2018-08-06 (×4): qty 1

## 2018-08-06 MED ORDER — CARBIDOPA-LEVODOPA 25-100 MG PO TABS
1.0000 | ORAL_TABLET | Freq: Three times a day (TID) | ORAL | Status: DC
  Administered 2018-08-07 – 2018-08-09 (×6): 1 via ORAL
  Filled 2018-08-06 (×10): qty 1

## 2018-08-06 MED ORDER — ACETAMINOPHEN 650 MG RE SUPP: 650.0000 mg | Freq: Four times a day (QID) | RECTAL | Status: DC | PRN

## 2018-08-06 MED ORDER — VENLAFAXINE HCL ER 150 MG PO CP24
150.0000 mg | ORAL_CAPSULE | Freq: Every day | ORAL | Status: DC
  Administered 2018-08-07 – 2018-08-09 (×3): 150 mg via ORAL
  Filled 2018-08-06 (×4): qty 1

## 2018-08-06 MED ORDER — DOCUSATE SODIUM 100 MG PO CAPS
100.0000 mg | ORAL_CAPSULE | Freq: Two times a day (BID) | ORAL | Status: DC
  Administered 2018-08-06 – 2018-08-09 (×6): 100 mg via ORAL
  Filled 2018-08-06 (×6): qty 1

## 2018-08-06 MED ORDER — PROPOFOL 10 MG/ML IV BOLUS
1.0000 mg/kg | Freq: Once | INTRAVENOUS | Status: DC
  Filled 2018-08-06: qty 20

## 2018-08-06 MED ORDER — POLYETHYLENE GLYCOL 3350 17 G PO PACK: 17.0000 g | PACK | Freq: Every day | ORAL | Status: DC | PRN

## 2018-08-06 MED ORDER — LORAZEPAM 1 MG PO TABS: 1.0000 mg | ORAL_TABLET | Freq: Four times a day (QID) | ORAL | Status: DC | PRN

## 2018-08-06 MED ORDER — PROPOFOL 10 MG/ML IV BOLUS
INTRAVENOUS | Status: AC | PRN
  Administered 2018-08-06: 60 mg via INTRAVENOUS

## 2018-08-06 MED ORDER — CLONAZEPAM 0.5 MG PO TABS
0.5000 mg | ORAL_TABLET | Freq: Two times a day (BID) | ORAL | Status: DC | PRN
  Administered 2018-08-08 – 2018-08-09 (×2): 0.5 mg via ORAL
  Filled 2018-08-06 (×2): qty 1

## 2018-08-06 MED ORDER — HYDROCODONE-ACETAMINOPHEN 5-325 MG PO TABS
1.0000 | ORAL_TABLET | ORAL | Status: DC | PRN
  Administered 2018-08-06 – 2018-08-09 (×7): 2 via ORAL
  Filled 2018-08-06 (×7): qty 2

## 2018-08-06 MED ORDER — SODIUM CHLORIDE 0.9 % IV BOLUS
1000.0000 mL | Freq: Once | INTRAVENOUS | Status: AC
  Administered 2018-08-06: 1000 mL via INTRAVENOUS

## 2018-08-06 MED ORDER — SODIUM CHLORIDE 0.9 % IV SOLN
INTRAVENOUS | Status: DC
  Administered 2018-08-06 – 2018-08-07 (×2): via INTRAVENOUS

## 2018-08-06 MED ORDER — BISACODYL 10 MG RE SUPP: 10.0000 mg | Freq: Every day | RECTAL | Status: DC | PRN

## 2018-08-06 MED ORDER — ACETAMINOPHEN 325 MG PO TABS: 650.0000 mg | ORAL_TABLET | Freq: Four times a day (QID) | ORAL | Status: DC | PRN

## 2018-08-06 MED ORDER — FOLIC ACID 1 MG PO TABS
1.0000 mg | ORAL_TABLET | Freq: Every day | ORAL | Status: DC
  Administered 2018-08-07 – 2018-08-09 (×3): 1 mg via ORAL
  Filled 2018-08-06 (×3): qty 1

## 2018-08-06 MED ORDER — TRAZODONE HCL 50 MG PO TABS
150.0000 mg | ORAL_TABLET | Freq: Every day | ORAL | Status: DC
  Administered 2018-08-06 – 2018-08-08 (×3): 150 mg via ORAL
  Filled 2018-08-06 (×3): qty 1

## 2018-08-06 MED ORDER — ALBUTEROL SULFATE (2.5 MG/3ML) 0.083% IN NEBU
2.5000 mg | INHALATION_SOLUTION | Freq: Four times a day (QID) | RESPIRATORY_TRACT | Status: DC
  Administered 2018-08-07 (×2): 2.5 mg via RESPIRATORY_TRACT
  Filled 2018-08-06 (×3): qty 3

## 2018-08-06 MED ORDER — THIAMINE HCL 100 MG/ML IJ SOLN: 100.0000 mg | Freq: Every day | INTRAMUSCULAR | Status: DC

## 2018-08-06 MED ORDER — MORPHINE SULFATE (PF) 2 MG/ML IV SOLN
2.0000 mg | INTRAVENOUS | Status: DC | PRN
  Filled 2018-08-06: qty 1

## 2018-08-06 MED ORDER — ENOXAPARIN SODIUM 40 MG/0.4ML ~~LOC~~ SOLN
40.0000 mg | SUBCUTANEOUS | Status: DC
  Administered 2018-08-06 – 2018-08-08 (×3): 40 mg via SUBCUTANEOUS
  Filled 2018-08-06 (×3): qty 0.4

## 2018-08-06 NOTE — ED Notes (Signed)
Pt urinated all over bed. Peri care provided and bed sheets changed.

## 2018-08-06 NOTE — Clinical Social Work Note (Signed)
CSW consulted for facility placement. CSW found through chart review that patient has Medicare and is unable to be placed at this time due to no inpatient stay. CSW notified RNCM Jeanna of above. Patient will need to go home with home health unless she has medical necessity for admission. CSW signing off. Please re consult if further needs arise.   [place], [place]

## 2018-08-06 NOTE — ED Notes (Signed)
ED TO INPATIENT HANDOFF REPORT  ED Nurse Name and Phone #: Metta Clines [phone]  S Name/Age/Gender [name] 63 y.o. female Room/Bed: ED08A/ED08A  Code Status   Code Status: Prior  Home/SNF/Other Rehab Patient oriented to: self, place, time and situation Is this baseline? Yes   Triage Complete: Triage complete  Chief Complaint fall  Triage Note Pt to ED by EMS with c/o of lower back and left leg pain. Pt with hx of bilateral hip replacement.    Allergies Allergies  Allergen Reactions  . Benadryl [Diphenhydramine] Hives and Other (See Comments)    Reaction:  Hyperactivity   . Benadryl [Diphenhydramine] Other (See Comments)    My jaw locked and I could not speak, like a stroke.  . Cephalosporins Hives  . Codeine Nausea And Vomiting  . Codeine Nausea Only    severe    Level of Care/Admitting Diagnosis ED Disposition    ED Disposition Condition [facility] Area: [place] [100120]  Level of Care: Med-Surg [16]  Diagnosis: Hip dislocation, left, sequela [673419]  Admitting Physician: Gorden Harms [[id number]]  Attending Physician: Gorden Harms [[id number]]  PT Class (Do Not Modify): Observation [104]  PT Acc Code (Do Not Modify): Observation [10022]       B Medical/Surgery History Past Medical History:  Diagnosis Date  . Anxiety   . Anxiety    panic anxiety disorder  . Arthritis   . Asthma    chronic asthmatic bronchitis  . Bipolar disorder ([place])   . Cancer [facility])    Desmoid tumor left forearm  . Depression   . ETOH abuse   . Heart murmur   . Insomnia   . Neuropathy    Past Surgical History:  Procedure Laterality Date  . ABDOMINAL HYSTERECTOMY    . ABDOMINAL HYSTERECTOMY  2007  . 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 ARM AMPUTATION AT SHOULDER Left   . CESAREAN SECTION    . [place]   x 2  . ESOPHAGOGASTRODUODENOSCOPY (EGD) WITH PROPOFOL N/A 04/27/2018   Procedure: ESOPHAGOGASTRODUODENOSCOPY (EGD) WITH PROPOFOL;  Surgeon: Toledo, Benay Pike, MD;  Location: [facility] ENDOSCOPY;  Service: Gastroenterology;  Laterality: N/A;  . JOINT REPLACEMENT Right 2012   hip  . LUMBAR LAMINECTOMY/DECOMPRESSION MICRODISCECTOMY Left 04/16/2016   Procedure: LUMBAR LAMINECTOMY/DECOMPRESSION MICRODISCECTOMY 1 LEVEL;  Surgeon: Meade Maw, MD;  Location: [facility] ORS;  Service: Neurosurgery;  Laterality: Left;  Left L4-5 far lateral discectomy, left L4-5 laminoforaminotomy  . TOTAL HIP ARTHROPLASTY Left 09/17/2015   Procedure: TOTAL HIP ARTHROPLASTY;  Surgeon: Dereck Leep, MD;  Location: [facility] ORS;  Service: Orthopedics;  Laterality: Left;     A IV Location/Drains/Wounds Patient Lines/Drains/Airways Status   Active Line/Drains/Airways    Name:   Placement date:   Placement time:   Site:   Days:   Peripheral IV 08/06/18 Right Antecubital   08/06/18    1239    Antecubital   less than 1   Airway   04/27/18    1426     101          Intake/Output Last 24 hours No intake or output data in the 24 hours ending 08/06/18 1832  Labs/Imaging Results for orders placed or performed during the hospital encounter of 08/06/18 (from the past 48 hour(s))  CBC with Differential     Status: Abnormal   Collection Time: 08/06/18 12:38 PM  Result Value Ref  Range   WBC 9.0 4.0 - 10.5 K/uL   RBC 4.44 3.87 - 5.11 MIL/uL   Hemoglobin 14.2 12.0 - 15.0 g/dL   HCT 41.1 36.0 - 46.0 %   MCV 92.6 80.0 - 100.0 fL   MCH 32.0 26.0 - 34.0 pg   MCHC 34.5 30.0 - 36.0 g/dL   RDW 13.8 11.5 - 15.5 %   Platelets 208 150 - 400 K/uL   nRBC 0.0 0.0 - 0.2 %   Neutrophils Relative % 59 %   Neutro Abs 5.3 1.7 - 7.7 K/uL   Lymphocytes Relative 29 %   Lymphs Abs 2.6 0.7 - 4.0 K/uL   Monocytes Relative 8 %   Monocytes Absolute 0.7 0.1 - 1.0 K/uL   Eosinophils Relative 2 %   Eosinophils Absolute 0.2 0.0 - 0.5 K/uL   Basophils Relative 1 %   Basophils Absolute 0.1 0.0 - 0.1 K/uL   Immature  Granulocytes 1 %   Abs Immature Granulocytes 0.12 (H) 0.00 - 0.07 K/uL    Comment: Performed at [facility], [address] [place], [place] [address]  Comprehensive metabolic panel     Status: Abnormal   Collection Time: 08/06/18 12:38 PM  Result Value Ref Range   Sodium 128 (L) 135 - 145 mmol/L   Potassium 3.8 3.5 - 5.1 mmol/L   Chloride 95 (L) 98 - 111 mmol/L   CO2 23 22 - 32 mmol/L   Glucose, Bld 100 (H) 70 - 99 mg/dL   BUN 15 8 - 23 mg/dL   Creatinine, Ser 0.92 0.44 - 1.00 mg/dL   Calcium 8.8 (L) 8.9 - 10.3 mg/dL   Total Protein 6.9 6.5 - 8.1 g/dL   Albumin 4.0 3.5 - 5.0 g/dL   AST 16 15 - 41 U/L   ALT 6 0 - 44 U/L   Alkaline Phosphatase 64 38 - 126 U/L   Total Bilirubin 0.7 0.3 - 1.2 mg/dL   GFR calc non Af Amer >60 >60 mL/min   GFR calc Af Amer >60 >60 mL/min   Anion gap 10 5 - 15    Comment: Performed at [facility], [address] [place], [place] [address]  Ethanol     Status: None   Collection Time: 08/06/18 12:38 PM  Result Value Ref Range   Alcohol, Ethyl (B) <10 <10 mg/dL    Comment: (NOTE) Lowest detectable limit for serum alcohol is 10 mg/dL. For medical purposes only. Performed at [facility], [address], [place], [place] [address]    Dg Hip Malvin Johns Or Wo Pelvis 2-3 Views Left  Result Date: 08/06/2018 CLINICAL DATA:  Reduction of dislocated left hip prosthesis. EXAM: DG HIP (WITH OR WITHOUT PELVIS) 2-3V LEFT 3:57 p.m. COMPARISON:  Radiographs dated 08/06/2018 at 1:13 p.m. FINDINGS: There has been complete reduction of the dislocation of the femoral component of the left total hip prosthesis. No fracture.  Right hip prosthesis appears in good position. IMPRESSION: Complete reduction of the dislocation of the femoral component of the left hip prosthesis. Electronically Signed   By: Lorriane Shire M.D.   On: 08/06/2018 16:56   Dg Hip Unilat W Or Wo Pelvis 2-3 Views Left  Result Date: 08/06/2018 CLINICAL DATA:  [place] today.   Left hip pain. EXAM: DG HIP (WITH OR WITHOUT PELVIS) 2-3V LEFT COMPARISON:  Radiographs 09/17/2015 FINDINGS: The left femoral prosthesis is dislocated superiorly and posteriorly. No periprosthetic fractures identified. The right hip prosthesis is intact. The pubic symphysis  and SI joints are intact. The bony pelvis is intact. IMPRESSION: Superior posterior dislocation of the left femoral prosthesis. No acute fractures. Electronically Signed   By: Marijo Sanes M.D.   On: 08/06/2018 13:54    Pending Labs [employer] (From admission, onward)    Start     Ordered   Signed and Held  HIV antibody (Routine Testing)  Once,   R     Signed and Held   Signed and Held  CBC  (enoxaparin (LOVENOX)    CrCl >/= 30 ml/min)  Once,   R    Comments:  Baseline for enoxaparin therapy IF NOT ALREADY DRAWN.  Notify MD if PLT < 100 K.    Signed and Held   Signed and Held  Creatinine, serum  (enoxaparin (LOVENOX)    CrCl >/= 30 ml/min)  Once,   R    Comments:  Baseline for enoxaparin therapy IF NOT ALREADY DRAWN.    Signed and Held   Signed and Held  Creatinine, serum  (enoxaparin (LOVENOX)    CrCl >/= 30 ml/min)  Weekly,   R    Comments:  while on enoxaparin therapy    Signed and Held   Signed and Held  Basic metabolic panel  Tomorrow morning,   R     Signed and Held          Vitals/Pain Today's Vitals   08/06/18 1800 08/06/18 1815 08/06/18 1821 08/06/18 1830  BP: 125/68 112/65  118/82  Pulse: 76 75  77  Resp: 10 11  15   Temp:      TempSrc:      SpO2: 99% 97%  99%  Weight:      Height:      PainSc:   0-No pain     Isolation Precautions No active isolations  Medications Medications  propofol (DIPRIVAN) 10 mg/mL bolus/IV push 62.6 mg (62.6 mg Intravenous Not Given 08/06/18 1614)  HYDROcodone-acetaminophen (NORCO/VICODIN) 5-325 MG per tablet 1-2 tablet (has no administration in time range)  sodium chloride 0.9 % bolus 1,000 mL (0 mLs Intravenous Stopped 08/06/18 1718)  fentaNYL (SUBLIMAZE)  injection 50 mcg (50 mcg Intravenous Given 08/06/18 1416)  propofol (DIPRIVAN) 10 mg/mL bolus/IV push (60 mg Intravenous Given 08/06/18 1548)    Mobility walks with person assist High fall risk   Focused Assessments L Hip      R Recommendations: See Admitting Provider Note  Report given to:   Additional Notes:  L hip back in place per EDP provider. R ac 20g IV.

## 2018-08-06 NOTE — Care Management Note (Signed)
Case Management Note  Patient Details  Name: [name] MRN: [id number] Date of Birth: July 02, 1955  Subjective/Objective:    Patient is being seen in the ED after a fall today and subsequent hip pain.  Patient had a dislocation of the left hip which the ED MD was able to manipulate back into place.  Patient is having a lot of pain.  RNCM attempted to contact family unsuccessfully.  Patient report's that daughter is a Pharmacist, hospital and is currently working in [place] and that her son lives in [place].  Patient gave [facility] permission to call her husband even though she states they are separated and she is not allowed to speak with him.  No one answered the phone no messages left.  RNCM will see if patient is interested in home health services and offer choice.  Patient has traditional Medicare so to qualify for SNF she needs medically necessary 3 night inpatient stay.  Doran Clay RN BSN [phone]                  Action/Plan:   Expected Discharge Date:                  Expected Discharge Plan:     In-House Referral:     Discharge planning Services     Post Acute Care Choice:    Choice offered to:     DME Arranged:    DME Agency:     HH Arranged:    HH Agency:     Status of Service:     If discussed at [place] of Stay Meetings, dates discussed:    Additional Comments:  Shelly QUESADA, RN 08/06/2018, 4:03 PM

## 2018-08-06 NOTE — ED Triage Notes (Signed)
Pt to ED by EMS with c/o of lower back and left leg pain. Pt with hx of bilateral hip replacement.

## 2018-08-06 NOTE — ED Notes (Signed)
Pt signed consent forms for sedation and left hip reduction. In paper chart.

## 2018-08-06 NOTE — Progress Notes (Signed)
Family Meeting Note  Advance Directive:yes  Today a meeting took place with the Patient.  Patient is able to participate  The following clinical team members were present during this meeting:MD  The following were discussed:Patient's diagnosis: Recurrent hip dislocation, Patient's progosis: Unable to determine and Goals for treatment: Full Code  Additional follow-up to be provided: prn  Time spent during discussion:20 minutes  Gorden Harms, MD

## 2018-08-06 NOTE — ED Notes (Signed)
Pt urinated into purewick but wetted bed as well. Linens changed. Peri care provided.

## 2018-08-06 NOTE — ED Provider Notes (Addendum)
[facility] Emergency Department Provider Note  ____________________________________________   First MD Initiated Contact with Patient 08/06/18 1234     (approximate)  I have reviewed the triage vital signs and the nursing notes.   HISTORY  Chief Complaint Fall   HPI [name] is a 63 y.o. female with a history of bilateral hip replacements and alcohol abuse who states that she was walking to the convenient store today when she said she started experiencing low back pain, was bent over and then fell onto her left hip.  Says that she has not been able to walk since falling on her left hip and that she has a history of hip dislocation.  Denies any numbness or tingling to the hip or lower extremity.  Says that she is also had "a half a beer" today.   Past Medical History:  Diagnosis Date  . Anxiety   . Anxiety    panic anxiety disorder  . Arthritis   . Asthma    chronic asthmatic bronchitis  . Bipolar disorder ([place])   . Cancer [facility])    Desmoid tumor left forearm  . Depression   . ETOH abuse   . Heart murmur   . Insomnia   . Neuropathy     Patient Active Problem List   Diagnosis Date Noted  . High anion gap metabolic acidosis 40/97/3532  . Overdose of antidepressant 07/03/2016  . S/P total hip arthroplasty 09/17/2015  . Bipolar I disorder, most recent episode mixed, severe with psychotic features ([place]) 07/03/2015  . Alcohol use disorder, severe, dependence ([place]) 01/26/2015  . Tobacco use disorder 01/26/2015  . Alcohol withdrawal ([place]) 01/08/2015  . 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 GAD (generalized anxiety disorder) 12/24/2013  . PTSD (post-traumatic stress disorder) 12/22/2013    Past Surgical History:  Procedure Laterality Date  . ABDOMINAL HYSTERECTOMY    . ABDOMINAL HYSTERECTOMY  2007  . 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 ARM AMPUTATION AT SHOULDER Left   . CESAREAN  SECTION    . [place]   x 2  . ESOPHAGOGASTRODUODENOSCOPY (EGD) WITH PROPOFOL N/A 04/27/2018   Procedure: ESOPHAGOGASTRODUODENOSCOPY (EGD) WITH PROPOFOL;  Surgeon: Toledo, Benay Pike, MD;  Location: [facility] ENDOSCOPY;  Service: Gastroenterology;  Laterality: N/A;  . JOINT REPLACEMENT Right 2012   hip  . LUMBAR LAMINECTOMY/DECOMPRESSION MICRODISCECTOMY Left 04/16/2016   Procedure: LUMBAR LAMINECTOMY/DECOMPRESSION MICRODISCECTOMY 1 LEVEL;  Surgeon: Meade Maw, MD;  Location: [facility] ORS;  Service: Neurosurgery;  Laterality: Left;  Left L4-5 far lateral discectomy, left L4-5 laminoforaminotomy  . TOTAL HIP ARTHROPLASTY Left 09/17/2015   Procedure: TOTAL HIP ARTHROPLASTY;  Surgeon: Dereck Leep, MD;  Location: [facility] ORS;  Service: Orthopedics;  Laterality: Left;    Prior to Admission medications   Medication Sig Start Date End Date Taking? Authorizing Provider  aspirin EC 81 MG tablet Take 81 mg by mouth daily.    [provider]  carbidopa-levodopa (SINEMET IR) 25-100 MG tablet Take 1 tablet by mouth 3 (three) times daily.    [provider]  clonazePAM (KLONOPIN) 0.5 MG tablet Take 0.5 mg by mouth 2 (two) times daily as needed for anxiety.    [provider]  ergocalciferol (VITAMIN D2) 50000 units capsule Take 50,000 Units by mouth once a week.    [provider]  famotidine (PEPCID) 20 MG tablet Take 20 mg by mouth 2 (two) times  daily.    [provider]  folic acid (FOLVITE) 1 MG tablet Take 1 tablet (1 mg total) by mouth daily. 05/21/17   Pucilowska, Herma Ard B, MD  gabapentin (NEURONTIN) 300 MG capsule Take 3 capsules (900 mg total) by mouth 3 (three) times daily. 05/21/17   Pucilowska, Herma Ard B, MD  ibuprofen (ADVIL,MOTRIN) 200 MG tablet Take 600 mg by mouth every 8 (eight) hours as needed.    [provider]  Multiple Vitamin (MULTIVITAMIN WITH MINERALS) TABS tablet Take 1 tablet by mouth daily. 05/21/17    Pucilowska, Herma Ard B, MD  propranolol (INDERAL) 10 MG tablet Take 1 tablet (10 mg total) by mouth 3 (three) times daily. 05/21/17   Pucilowska, [name], MD  senna-docusate (SENOKOT-S) 8.6-50 MG tablet Take 1 tablet by mouth 2 (two) times daily as needed for mild constipation. Patient not taking: Reported on 04/27/2018 05/21/17   Pucilowska, Herma Ard B, MD  thiamine 100 MG tablet Take 1 tablet (100 mg total) by mouth daily. 05/21/17   Pucilowska, Herma Ard B, MD  traZODone (DESYREL) 150 MG tablet Take by mouth at bedtime.    [provider]  venlafaxine XR (EFFEXOR-XR) 150 MG 24 hr capsule Take 1 capsule (150 mg total) by mouth daily with breakfast. 05/21/17   Pucilowska, [name], MD  VITAMIN B1-B12 IM Inject 1,000 mcg into the muscle every 30 (thirty) days.    [provider]    Allergies Benadryl [diphenhydramine]; Benadryl [diphenhydramine]; Cephalosporins; Codeine; and Codeine  Family History  Problem Relation Age of Onset  . COPD Mother     Social History Social History   Tobacco Use  . Smoking status: Current Every Day Smoker    Packs/day: 0.25    Types: Cigarettes  . Smokeless tobacco: Never Used  Substance Use Topics  . Alcohol use: Yes    Comment: 2 40oz beers daily  . Drug use: No    Comment: Patient denies     Review of Systems  Constitutional: No fever/chills Eyes: No visual changes. ENT: No sore throat. Cardiovascular: Denies chest pain. Respiratory: Denies shortness of breath. Gastrointestinal: No abdominal pain.  No nausea, no vomiting.  No diarrhea.  No constipation. Genitourinary: Negative for dysuria. Musculoskeletal: As above Skin: Negative for rash. Neurological: Negative for headaches, focal weakness or numbness.   ____________________________________________   PHYSICAL EXAM:  VITAL SIGNS: ED Triage Vitals  Enc Vitals Group     BP 08/06/18 1240 (!) 133/104     Pulse Rate 08/06/18 1240 86     Resp 08/06/18 1240 13      Temp 08/06/18 1240 98.9 F (37.2 C)     Temp Source 08/06/18 1240 Oral     SpO2 08/06/18 1232 96 %     Weight 08/06/18 1235 138 lb (62.6 kg)     Height 08/06/18 1235 5\' 5"  (1.651 m)     Head Circumference --      Peak Flow --      Pain Score 08/06/18 1235 10     Pain Loc --      Pain Edu? --      Excl. in [place]? --     Constitutional: Alert and oriented.  In no acute distress.  Appears mildly intoxicated. Eyes: Conjunctivae are normal.  Head: Atraumatic. Nose: No congestion/rhinnorhea. Mouth/Throat: Mucous membranes are moist.  Neck: No stridor.   Cardiovascular: Normal rate, regular rhythm. Grossly normal heart sounds.  Good peripheral circulation with equal and bilateral dorsalis pedis pulses. Respiratory: Normal respiratory effort.  No retractions. Lungs CTAB. Gastrointestinal: Soft and nontender. No distention.  Musculoskeletal: Left lower extremity internally rotated and shortened.  Tenderness to palpation anteriorly and laterally to the left hip.  Neurovascular intact with sensation that is intact to light touch.  Lumbar spine across the lumbar region as well as midline with very minimal tenderness to palpation.  No deformity or step-off.  Neurologic:  Normal speech and language. No gross focal neurologic deficits are appreciated. Skin:  Skin is warm, dry and intact. No rash noted. Psychiatric: Mood and affect are normal. Speech and behavior are normal.  ____________________________________________   LABS (all labs ordered are listed, but only abnormal results are displayed)  Labs Reviewed  CBC WITH DIFFERENTIAL/PLATELET - Abnormal; Notable for the following components:      Result Value   Abs Immature Granulocytes 0.12 (*)    All other components within normal limits  COMPREHENSIVE METABOLIC PANEL - Abnormal; Notable for the following components:   Sodium 128 (*)    Chloride 95 (*)    Glucose, Bld 100 (*)    Calcium 8.8 (*)    All other components within normal  limits  ETHANOL   ____________________________________________  EKG  ED ECG REPORT I, Doran Stabler, the attending physician, personally viewed and interpreted this ECG.   Date: 08/06/2018  EKG Time: 1234  Rate: 87  Rhythm: normal sinus rhythm  Axis: Normal  Intervals:left bundle branch block  ST&T Change: No ST segment elevation or depression.  Single T wave inversion in aVL. No significant change from previous EKGs. ____________________________________________  RADIOLOGY  Left hip sup/post dislocation  Postreduction film appears to be a successful reduction. ____________________________________________   PROCEDURES  Procedure(s) performed:   .Sedation Date/Time: 08/06/2018 3:34 PM Performed by: Orbie Pyo, MD Authorized by: Orbie Pyo, MD   Consent:    Consent obtained:  Written (electronic informed consent)   Risks discussed:  Allergic reaction, dysrhythmia, inadequate sedation, nausea, vomiting, respiratory compromise necessitating ventilatory assistance and intubation, prolonged sedation necessitating reversal and prolonged hypoxia resulting in organ damage Universal protocol:    Procedure explained and questions answered to patient or proxy's satisfaction: yes     Relevant documents present and verified: yes     Test results available and properly labeled: yes     Imaging studies available: yes     Required blood products, implants, devices, and special equipment available: yes     Immediately prior to procedure a time out was called: yes     Patient identity confirmation method:  Arm band Indications:    Procedure performed:  Dislocation reduction   Procedure necessitating sedation performed by:  Physician performing sedation Pre-sedation assessment:    Time since last food or drink:  5.5 hrs   ASA classification: class 2 - patient with mild systemic disease     Neck mobility: normal     Mouth opening:  3 or more finger  widths   Thyromental distance:  2 finger widths   Mallampati score:  I - soft palate, uvula, fauces, pillars visible   Pre-sedation assessments completed and reviewed: airway patency, cardiovascular function, hydration status, mental status, nausea/vomiting, pain level, respiratory function and temperature     Pre-sedation assessment completed:  08/06/2018 3:36 PM Immediate pre-procedure details:    Reassessment: Patient reassessed immediately prior to procedure     Reviewed: vital signs, relevant labs/tests and NPO status     Verified: bag valve mask available, emergency equipment available, intubation equipment available, IV patency confirmed,  oxygen available, reversal medications available and suction available   Procedure details (see MAR for exact dosages):    Preoxygenation:  Nasal cannula   Sedation:  Propofol   Intra-procedure monitoring:  Blood pressure monitoring, continuous pulse oximetry, cardiac monitor, frequent vital sign checks and frequent LOC assessments   Total Provider sedation time (minutes):  5 Post-procedure details:    Post-sedation assessment completed:  08/06/2018 4:10 PM   Attendance: Constant attendance by certified staff until patient recovered     Recovery: Patient returned to pre-procedure baseline     Post-sedation assessments completed and reviewed: airway patency, cardiovascular function, hydration status, mental status and respiratory function     Patient is stable for discharge or admission: yes     Patient tolerance:  Tolerated well, no immediate complications Comments:     Reduction achieved with traction countertraction as well as internal rotation at the hip.  I felt a clunk.  Patient neurovascularly intact after waking and placed in a left-sided knee immobilizer.  She has neurovascularly intact.  Reviewed the films and appear to be successfully reduced.    Critical Care performed:   ____________________________________________   INITIAL  IMPRESSION / ASSESSMENT AND PLAN / ED COURSE  Pertinent labs & imaging results that were available during my care of the patient were reviewed by me and considered in my medical decision making (see chart for details).  DDX: Hip fracture, hip dislocation, chronic low back pain, compression fracture, alcohol intoxication, hip contusion As part of my medical decision making, I reviewed the following data within the [place] Notes from prior ED visits  ----------------------------------------- 4:11 PM on 08/06/2018 -----------------------------------------  Patient back to baseline mental status at this time.  Left hip with successful reduction and placed in knee immobilizer.  Neurovascularly intact.  Smooth passive range of motion of the left hip.  Patient with left upper extremity amputation.  Lives alone at home.  Social work contacted for possible placement or home health.  Signed out to Dr. Joni Fears.  Formed by x-ray tech that we would not be able to obtain a lumbar spine film secondary to patient in the immobilizer.  Fall likely related to exacerbation of chronic back pain.  Doubt acute injury to the low lumbar region.  Trauma appears to be the left hip.  Patient with minimal back pain at this time.  No distress. ____________________________________________   FINAL CLINICAL IMPRESSION(S) / ED DIAGNOSES  Fall.  Left-sided hip dislocation.  NEW MEDICATIONS STARTED DURING THIS VISIT:  New Prescriptions   No medications on file     Note:  This document was prepared using Dragon voice recognition software and may include unintentional dictation errors.     Orbie Pyo, MD 08/06/18 1611    Orbie Pyo, MD 08/06/18 1613    Clearnce Hasten Randall An, MD 08/06/18 [id number]

## 2018-08-06 NOTE — H&P (Signed)
[place] [facility] [place] NAME: [name]    MR#:  [id number]  DATE OF BIRTH:  [birth year]  DATE OF ADMISSION:  08/06/2018  PRIMARY CARE PHYSICIAN: Dion Body, MD   REQUESTING/REFERRING PHYSICIAN:   CHIEF COMPLAINT:   Chief Complaint  Patient presents with  . Fall    HISTORY OF PRESENT ILLNESS: [name]  is a 63 y.o. female with a known history of a bilateral hip replacements as well as hip dislocation, sustained a fall after patient bent over after some acute back pain, resulted in acute leg pain/inability to stand/ambulate, brought to the emergency room for further evaluation/care, in the emergency room patient was found to have acute dislocated hip prosthesis which was reduced in the emergency room, sodium was 128, chloride 95, hospitalist asked to admit, patient evaluated at the bedside, no apparent distress, resting comfortably in bed, patient is daily admitted for acute left dislocated hip prosthesis status post reduction in the ER, acute hyponatremia/hypochloremia.  PAST MEDICAL HISTORY:   Past Medical History:  Diagnosis Date  . Anxiety   . Anxiety    panic anxiety disorder  . Arthritis   . Asthma    chronic asthmatic bronchitis  . Bipolar disorder ([place])   . Cancer [name])    Desmoid tumor left forearm  . Depression   . ETOH abuse   . Heart murmur   . Insomnia   . Neuropathy     PAST SURGICAL HISTORY:  Past Surgical History:  Procedure Laterality Date  . ABDOMINAL HYSTERECTOMY    . ABDOMINAL HYSTERECTOMY  2007  . 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 ARM AMPUTATION AT SHOULDER Left   . CESAREAN SECTION    . [place]   x 2  . ESOPHAGOGASTRODUODENOSCOPY (EGD) WITH PROPOFOL N/A 04/27/2018   Procedure: ESOPHAGOGASTRODUODENOSCOPY (EGD) WITH PROPOFOL;  Surgeon: Toledo, Benay Pike, MD;  Location: [facility] ENDOSCOPY;  Service: Gastroenterology;  Laterality: N/A;  . JOINT REPLACEMENT  Right 2012   hip  . LUMBAR LAMINECTOMY/DECOMPRESSION MICRODISCECTOMY Left 04/16/2016   Procedure: LUMBAR LAMINECTOMY/DECOMPRESSION MICRODISCECTOMY 1 LEVEL;  Surgeon: Meade Maw, MD;  Location: [facility] ORS;  Service: Neurosurgery;  Laterality: Left;  Left L4-5 far lateral discectomy, left L4-5 laminoforaminotomy  . TOTAL HIP ARTHROPLASTY Left 09/17/2015   Procedure: TOTAL HIP ARTHROPLASTY;  Surgeon: Dereck Leep, MD;  Location: [facility] ORS;  Service: Orthopedics;  Laterality: Left;    SOCIAL HISTORY:  Social History   Tobacco Use  . Smoking status: Current Every Day Smoker    Packs/day: 0.25    Types: Cigarettes  . Smokeless tobacco: Never Used  Substance Use Topics  . Alcohol use: Yes    Comment: 2 40oz beers daily    FAMILY HISTORY:  Family History  Problem Relation Age of Onset  . COPD Mother     DRUG ALLERGIES:  Allergies  Allergen Reactions  . Benadryl [Diphenhydramine] Hives and Other (See Comments)    Reaction:  Hyperactivity   . Benadryl [Diphenhydramine] Other (See Comments)    My jaw locked and I could not speak, like a stroke.  . Cephalosporins Hives  . Codeine Nausea And Vomiting  . Codeine Nausea Only    severe    REVIEW OF SYSTEMS:   CONSTITUTIONAL: No fever, fatigue or weakness.  EYES: No blurred or double vision.  EARS, NOSE, AND THROAT: No tinnitus or ear pain.  RESPIRATORY: No cough, shortness of breath,  wheezing or hemoptysis.  CARDIOVASCULAR: No chest pain, orthopnea, edema.  GASTROINTESTINAL: No nausea, vomiting, diarrhea or abdominal pain.  GENITOURINARY: No dysuria, hematuria.  ENDOCRINE: No polyuria, nocturia,  HEMATOLOGY: No anemia, easy bruising or bleeding SKIN: No rash or lesion. MUSCULOSKELETAL: Back pain, left hip pain  nEUROLOGIC: No tingling, numbness, weakness.  PSYCHIATRY: No anxiety or depression.   MEDICATIONS AT HOME:  Prior to Admission medications   Medication Sig Start Date End Date Taking? Authorizing Provider   carbidopa-levodopa (SINEMET IR) 25-100 MG tablet Take 1 tablet by mouth 3 (three) times daily.   Yes [provider]  clonazePAM (KLONOPIN) 0.5 MG tablet Take 0.5 mg by mouth 2 (two) times daily as needed for anxiety.   Yes [provider]  famotidine (PEPCID) 20 MG tablet Take 20 mg by mouth 2 (two) times daily.   Yes [provider]  gabapentin (NEURONTIN) 300 MG capsule Take 3 capsules (900 mg total) by mouth 3 (three) times daily. 05/21/17  Yes Pucilowska, [name], MD  propranolol (INDERAL) 10 MG tablet Take 1 tablet (10 mg total) by mouth 3 (three) times daily. 05/21/17  Yes Pucilowska, [name], MD  traZODone (DESYREL) 150 MG tablet Take 150 mg by mouth at bedtime.    Yes [provider]  venlafaxine XR (EFFEXOR-XR) 150 MG 24 hr capsule Take 1 capsule (150 mg total) by mouth daily with breakfast. 05/21/17  Yes Pucilowska, [name], MD      PHYSICAL EXAMINATION:   VITAL SIGNS: Blood pressure 130/72, pulse 79, temperature (!) 97.4 F (36.3 C), temperature source Oral, resp. rate 10, height 5\' 5"  (1.651 m), weight 62.6 kg, SpO2 100 %.  GENERAL:  63 y.o.-year-old patient lying in the bed with no acute distress.  EYES: Pupils equal, round, reactive to light and accommodation. No scleral icterus. Extraocular muscles intact.  HEENT: Head atraumatic, normocephalic. Oropharynx and nasopharynx clear.  NECK:  Supple, no jugular venous distention. No thyroid enlargement, no tenderness.  LUNGS: Normal breath sounds bilaterally, no wheezing, rales,rhonchi or crepitation. No use of accessory muscles of respiration.  CARDIOVASCULAR: S1, S2 normal. No murmurs, rubs, or gallops.  ABDOMEN: Soft, nontender, nondistended. Bowel sounds present. No organomegaly or mass.  EXTREMITIES: No pedal edema, cyanosis, or clubbing.  Decreased range of motion left lower extremity due to pain NEUROLOGIC: Cranial nerves II through XII are intact. MAES.  PSYCHIATRIC: The patient is  alert and oriented x 3.  SKIN: No obvious rash, lesion, or ulcer.   LABORATORY PANEL:   CBC Recent Labs  Lab 08/06/18 1238  WBC 9.0  HGB 14.2  HCT 41.1  PLT 208  MCV 92.6  MCH 32.0  MCHC 34.5  RDW 13.8  LYMPHSABS 2.6  MONOABS 0.7  EOSABS 0.2  BASOSABS 0.1   ------------------------------------------------------------------------------------------------------------------  Chemistries  Recent Labs  Lab 08/06/18 1238  NA 128*  K 3.8  CL 95*  CO2 23  GLUCOSE 100*  BUN 15  CREATININE 0.92  CALCIUM 8.8*  AST 16  ALT 6  ALKPHOS 64  BILITOT 0.7   ------------------------------------------------------------------------------------------------------------------ estimated creatinine clearance is 55.6 mL/min (by C-G formula based on SCr of 0.92 mg/dL). ------------------------------------------------------------------------------------------------------------------ No results for input(s): TSH, T4TOTAL, T3FREE, THYROIDAB in the last 72 hours.  Invalid input(s): FREET3   Coagulation profile No results for input(s): INR, PROTIME in the last 168 hours. ------------------------------------------------------------------------------------------------------------------- No results for input(s): DDIMER in the last 72 hours. -------------------------------------------------------------------------------------------------------------------  Cardiac Enzymes No results for input(s): CKMB, TROPONINI, MYOGLOBIN in the last 168 hours.  Invalid input(s): CK ------------------------------------------------------------------------------------------------------------------ Invalid input(s): POCBNP  ---------------------------------------------------------------------------------------------------------------  Urinalysis    Component Value Date/Time   COLORURINE YELLOW (A) 04/07/2018 1155   APPEARANCEUR CLEAR (A) 04/07/2018 1155   APPEARANCEUR Clear 10/11/2014 1516   LABSPEC  1.020 04/07/2018 1155   LABSPEC 1.002 10/11/2014 1516   PHURINE 7.0 04/07/2018 1155   GLUCOSEU NEGATIVE 04/07/2018 1155   GLUCOSEU Negative 10/11/2014 1516   HGBUR SMALL (A) 04/07/2018 1155   BILIRUBINUR NEGATIVE 04/07/2018 1155   BILIRUBINUR Negative 10/11/2014 1516   KETONESUR 5 (A) 04/07/2018 1155   PROTEINUR 30 (A) 04/07/2018 1155   UROBILINOGEN 0.2 12/21/2013 1024   NITRITE NEGATIVE 04/07/2018 1155   LEUKOCYTESUR NEGATIVE 04/07/2018 1155   LEUKOCYTESUR Negative 10/11/2014 1516     RADIOLOGY: Dg Hip Unilat W Or Wo Pelvis 2-3 Views Left  Result Date: 08/06/2018 CLINICAL DATA:  Reduction of dislocated left hip prosthesis. EXAM: DG HIP (WITH OR WITHOUT PELVIS) 2-3V LEFT 3:57 p.m. COMPARISON:  Radiographs dated 08/06/2018 at 1:13 p.m. FINDINGS: There has been complete reduction of the dislocation of the femoral component of the left total hip prosthesis. No fracture.  Right hip prosthesis appears in good position. IMPRESSION: Complete reduction of the dislocation of the femoral component of the left hip prosthesis. Electronically Signed   By: Lorriane Shire M.D.   On: 08/06/2018 16:56   Dg Hip Unilat W Or Wo Pelvis 2-3 Views Left  Result Date: 08/06/2018 CLINICAL DATA:  [place] today.  Left hip pain. EXAM: DG HIP (WITH OR WITHOUT PELVIS) 2-3V LEFT COMPARISON:  Radiographs 09/17/2015 FINDINGS: The left femoral prosthesis is dislocated superiorly and posteriorly. No periprosthetic fractures identified. The right hip prosthesis is intact. The pubic symphysis and SI joints are intact. The bony pelvis is intact. IMPRESSION: Superior posterior dislocation of the left femoral prosthesis. No acute fractures. Electronically Signed   By: Marijo Sanes M.D.   On: 08/06/2018 13:54    EKG: Orders placed or performed during the hospital encounter of 04/07/18  . EKG 12-Lead  . EKG 12-Lead  . ED EKG  . ED EKG    IMPRESSION AND PLAN: *Acute left dislocated hip prosthesis Noted history of dislocation  in the past, status post reduction in the emergency room Referred to the observation unit, adult pain protocol, orthopedic surgery to see, PT/OT to evaluate/treat  *Acute hyponatremia/hypochloremia Replete with IV fluids rehydration  *History of alcohol abuse Alcohol withdrawal protocol while in house  *History of asthma without exacerbation Stable Breathing treatments PRN  *Chronic bipolar illness, anxiety, panic disorder Stable Continue home psychotropic regiment  *History of arm amputation for tumor Stable Conservative management    All the records are reviewed and case discussed with ED provider. Management plans discussed with the patient, family and they are in agreement.  CODE STATUS:full Code Status History    Date Active Date Inactive Code Status Order ID Comments User Context   05/11/2017 1604 05/21/2017 1521 Full Code [id number]  Gonzella Lex, MD Inpatient   05/11/2017 1604 05/11/2017 1604 Full Code [id number]  Gonzella Lex, MD Inpatient   05/08/2017 1615 05/11/2017 1532 Full Code [id number]  Loletha Grayer, MD ED   07/05/2016 0438 07/11/2016 2023 Full Code [id number]  Clapacs, [name], MD Inpatient   07/04/2015 1503 07/09/2015 1915 Full Code [id number]  Shelly Priest, MD Inpatient   07/03/2015 2358 07/04/2015 1503 Full Code [id number]  Gonzella Lex, MD Inpatient   06/30/2015 2018 07/03/2015 2358 Full Code [id number]  Loletha Grayer, MD ED   01/25/2015  1426 01/30/2015 2050 Full Code [id number]  Gonzella Lex, MD Inpatient   01/08/2015 1244 01/10/2015 1545 Full Code [id number]  Henreitta Leber, MD Inpatient   12/21/2013 1716 12/27/2013 1140 Full Code [id number]  Clarene Reamer, MD Inpatient   12/21/2013 1013 12/21/2013 1716 Full Code [id number]  Virgel Manifold, MD ED       TOTAL TIME TAKING CARE OF THIS PATIENT: 40 minutes.    Avel Peace Salary M.D on 08/06/2018   Between 7am to 6pm - Pager - [phone]  After 6pm go to [email or web address] - password [email or web address]  [facility]  Office  [phone]  CC: Primary care physician; Dion Body, MD   Note: This dictation was prepared with Dragon dictation along with smaller phrase technology. Any transcriptional errors that result from this process are unintentional.

## 2018-08-06 NOTE — ED Notes (Signed)
Pt denies pain. Given food tray and drink. Repositioned in bed.

## 2018-08-06 NOTE — Care Management Note (Signed)
Case Management Note  Patient Details  Name: [name] MRN: [id number] Date of Birth: [birth year]  Subjective/Objective:    RNCM was able to contact patient husband and get the number for a good friend of the patient's [name] [phone].  RNCM was then able to contact [name] on behalf of the patient to inform her of patient's visit to the ED.  [name] reports that she will be able to go Gray after the patient's cats and get her key and make sure the apartment is locked up.  [name] requests that the patient be kept over night because it would not be safe for her to be at home alone.  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  Patient reports she has a cane at home and grab bars and an elevated toilet seat.  Patient does not want to go to SNF would prefer to go home with home health.  Patient does not want to go home tonight, she wants to stay in the hospital.  ED MD is going to refer to the hospitalist for admission.  Home Health Choice offered and patient would like to go with [facility] she has had them in the past.  [name] with [facility] notified of impending referral by voicemail.  Patient will need all services RN, PT, OT, aide, SW.   Shelly Clay RN BSN [phone]                Action/Plan:   Expected Discharge Date:                  Expected Discharge Plan:  [place]  In-House Referral:  Clinical Social Work  Discharge planning Services  CM Consult  Post Acute Care Choice:  Home Health Choice offered to:  Patient  DME Arranged:    DME Agency:     HH Arranged:  RN, PT, OT, Nurse's Aide, Social Work [employer] Agency:  [facility]  Status of Service:  In process, will continue to follow  If discussed at Long Length of Stay Meetings, dates discussed:    Additional Comments:  Shelly GONZALO,  RN 08/06/2018, 4:47 PM

## 2018-08-06 NOTE — ED Notes (Addendum)
Portion of propofol not given (38mL) wasted with Jeanette Caprice, RN as witnesses.

## 2018-08-06 NOTE — ED Provider Notes (Signed)
-----------------------------------------   4:53 PM on 08/06/2018 ----------------------------------------- Post reduction x-ray confirms relocation of the left hip prosthesis.  No apparent fracture or other complication.  Patient is back to preprocedural baseline.  Labs do show a sodium of 128 which appears to be acute compared to a level of 136 just 11 days ago and previously 140.  This may be also causing some balance and strength issues that predisposed her to falling.  Social work is involved to help facilitate a safe discharge home (lives alone, now in LLE knee immob, has remote LUE amputation), but I have contacted hospitalist for further evaluation and management of her acute pain needs and hyponatremia.   Carrie Mew, MD 08/06/18 1655

## 2018-08-06 NOTE — ED Notes (Signed)
Knee immobilizer placed by EDP Schaevitz.

## 2018-08-06 NOTE — ED Notes (Signed)
Pt is very agitated with care and demanding that people be called that I do not have numbers for - she is demanding that other patients charts be opened to obtain numbers - she is extremely upset and tearful because I refused to open the other charts

## 2018-08-06 NOTE — ED Notes (Signed)
Admitting MD speaking with pt at bedside.

## 2018-08-07 ENCOUNTER — Other Ambulatory Visit: Payer: Self-pay

## 2018-08-07 DIAGNOSIS — T84021A Dislocation of internal left hip prosthesis, initial encounter: Secondary | ICD-10-CM | POA: Diagnosis not present

## 2018-08-07 LAB — BASIC METABOLIC PANEL
Anion gap: 4 — ABNORMAL LOW (ref 5–15)
BUN: 20 mg/dL (ref 8–23)
CO2: 24 mmol/L (ref 22–32)
CREATININE: 1.18 mg/dL — AB (ref 0.44–1.00)
Calcium: 8 mg/dL — ABNORMAL LOW (ref 8.9–10.3)
Chloride: 110 mmol/L (ref 98–111)
GFR calc Af Amer: 56 mL/min — ABNORMAL LOW (ref >60)
GFR calc non Af Amer: 49 mL/min — ABNORMAL LOW (ref >60)
Glucose, Bld: 98 mg/dL (ref 70–99)
Potassium: 3.8 mmol/L (ref 3.5–5.1)
Sodium: 138 mmol/L (ref 135–145)

## 2018-08-07 MED ORDER — MORPHINE SULFATE (PF) 2 MG/ML IV SOLN: 2.0000 mg | Freq: Four times a day (QID) | INTRAVENOUS | Status: DC | PRN

## 2018-08-07 MED ORDER — MORPHINE SULFATE (PF) 2 MG/ML IV SOLN
1.0000 mg | Freq: Four times a day (QID) | INTRAVENOUS | Status: DC | PRN
  Administered 2018-08-08: 1 mg via INTRAVENOUS
  Filled 2018-08-07: qty 1

## 2018-08-07 NOTE — Clinical Social Work Note (Signed)
The CSW met with the patient at bedside to discuss discharge planning. The CSW explained the PT/OT recommendations for SNF and the barrier that the patient has not had a qualifying 3-night inpatient medical admission in the past 30 days, has traditional Medicare as her payor, and is currently under observation status. The CSW provided the other options for SNF placement: 1. The patient can pay privately or 2. The patient can use her secondary insurance (Medicaid) for payment; however, she would have a 30-day stay as well as needing to relinquish any disability payments to the facility to cover room/board. The patient declined both options; she did agree with HHPT/OT/SW/RN/aide. The CSW updated the attending MD and is signing off. Please consult should needs arise.  Santiago Bumpers, MSW, Latanya Presser [phone]

## 2018-08-07 NOTE — Evaluation (Signed)
Occupational Therapy Evaluation Patient Details Name: [name] MRN: [id number] DOB: Jun 25, 1955 Today's Date: 08/07/2018    History of Present Illness Patient is a 63 year old female who presented for L hip pain secondary to falling after bending over from back pain. Resulted in L hip dislocation which was reduced while in the ER.  PMH includes cancer, insomnia, neuropathy, L arm amputation above elbow, lumbar laminectomy, Bil THA.    Clinical Impression   Pt seen for OT evaluation this date. Prior to hospital admission, pt was independent with ADL and IADL including pet care for her cats. Per pt, spouse recently separated. Pt uses [facility] if she needs to for transfers and in the home. Currently pt demonstrates impairments in pain, balance, activity tolerance, strength, and ROM with hx of falls (approx 1x/mo) requiring max assist for LB ADL from bed level or seated EOB positioning. Mobility deferred per pt 2/2 significant pain and pain medication not kicked in yet. Pt instructed in AE/DME for ADL tasks, pet care considerations, and home/routines modifications to maximize safety/indep with ADL and IADL. Pt would benefit from skilled OT to address noted impairments and functional limitations (see below for any additional details) in order to maximize safety and independence while minimizing falls risk and caregiver burden.  Upon hospital discharge, recommend pt discharge to [facility].    Follow Up Recommendations  SNF    Equipment Recommendations  3 in 1 bedside commode    Recommendations for Other Services       Precautions / Restrictions Precautions Precautions: Fall Precaution Comments: Patient has single UE, utilizes cane at home., history of dislocation of replacement hips.  Required Braces or Orthoses: Knee Immobilizer - Left Knee Immobilizer - Left: On at all times Restrictions Weight Bearing Restrictions: Yes LLE Weight Bearing: Weight bearing as tolerated      Mobility Bed  Mobility   General bed mobility comments: pt adamantly declining bed mobility this date 2/2 pain  Transfers    Balance Overall balance assessment: Needs assistance;History of Falls                           ADL either performed or assessed with clinical judgement   ADL Overall ADL's : Needs assistance/impaired Eating/Feeding: Sitting;Modified independent   Grooming: Sitting;Modified independent   Upper Body Bathing: Sitting;Minimal assistance   Lower Body Bathing: Sitting/lateral leans;Maximal assistance   Upper Body Dressing : Sitting;Minimal assistance   Lower Body Dressing: Sitting/lateral leans;Maximal assistance                       Vision Baseline Vision/History: No visual deficits(pt reports she "should" wear glasses but cannot afford an eye exam) Patient Visual Report: No change from baseline       Perception     Praxis      Pertinent Vitals/Pain Pain Assessment: 0-10 Pain Score: 10-Worst pain ever Pain Location: low back down to left leg Pain Descriptors / Indicators: Aching;Radiating;Throbbing Pain Intervention(s): Limited activity within patient's tolerance;Monitored during session;Premedicated before session;Repositioned     Hand Dominance Right   Extremity/Trunk Assessment Upper Extremity Assessment Upper Extremity Assessment: Overall WFL for tasks assessed(hx LUE amputation; otherwise strength/ROM grossly WFL)   Lower Extremity Assessment Lower Extremity Assessment: Defer to PT evaluation;RLE deficits/detail;LLE deficits/detail RLE Deficits / Details: 4-/5 gross strength RLE Sensation: history of peripheral neuropathy LLE Deficits / Details: unable to fully assess due to L knee immobilizer. hip: 2/5, ankle 3+/5  LLE:  Unable to fully assess due to pain;Unable to fully assess due to immobilization       Communication Communication Communication: No difficulties   Cognition Arousal/Alertness: Awake/alert Behavior During  Therapy: WFL for tasks assessed/performed Overall Cognitive Status: Within Functional Limits for tasks assessed                                 General Comments: Patient responds to simple and complex requests.    General Comments      Exercises Other Exercises: pt instructed in AE/DME to minimize back pain and hip pain and maximize independence and safety with ADL tasks Other Exercises: pt educated in home/routines modifications include pet care considerations/modifications to maximize safety/indep with ADL and IADL tasks   Shoulder Instructions      Home Living Family/patient expects to be discharged to:: Private residence Living Arrangements: Alone(has a few cats) Available Help at Discharge: Friend(s);Available PRN/intermittently Type of Home: Apartment Home Access: Stairs to enter Entrance Stairs-Number of Steps: 3 Entrance Stairs-Rails: None Home Layout: One level     Bathroom Shower/Tub: Teacher, early years/pre: Handicapped height     Home Equipment: Cane - single point;Grab bars - tub/shower;Shower seat   Additional Comments: Patient reports she was independent in basic ADLs and iADLS. Able to do short ambulation within home with cane but has been having multiple falls.       Prior Functioning/Environment Level of Independence: Independent with assistive device(s)        Comments: Patient reports she uses the cane around the house occasionally. Does not go out of her house often. Husband and her just seperated and she lives alone. Friends come by and help with the cleaning and shopping.         OT Problem List: Decreased strength;Decreased range of motion;Decreased knowledge of use of DME or AE;Impaired UE functional use;Pain;Impaired balance (sitting and/or standing)      OT Treatment/Interventions: Self-care/ADL training;Balance training;Therapeutic exercise;Therapeutic activities;DME and/or AE instruction;Patient/family education     OT Goals(Current goals can be found in the care plan section) Acute Rehab OT Goals Patient Stated Goal: be able to walk, take care of my cats, and have less pain OT Goal Formulation: With patient Time For Goal Achievement: 08/21/18 Potential to Achieve Goals: Good ADL Goals Pt Will Perform Lower Body Dressing: with mod assist;sitting/lateral leans;with adaptive equipment;with min assist Pt Will Transfer to Toilet: with mod assist;stand pivot transfer;bedside commode Additional ADL Goal #1: Pt will perform bed mobility in preparation for seated ADL tasks with CGA.  OT Frequency: Min 2X/week   Barriers to D/C: Decreased caregiver support          Co-evaluation              AM-PAC OT "6 Clicks" Daily Activity     Outcome Measure Help from another person eating meals?: None Help from another person taking care of personal grooming?: None Help from another person toileting, which includes using toliet, bedpan, or urinal?: A Lot Help from another person bathing (including washing, rinsing, drying)?: A Lot Help from another person to put on and taking off regular upper body clothing?: A Little Help from another person to put on and taking off regular lower body clothing?: A Lot 6 Click Score: 17   End of Session    Activity Tolerance: Patient limited by pain Patient left: in bed;with call bell/phone within reach;with bed alarm set  OT  Visit Diagnosis: Other abnormalities of gait and mobility (R26.89);Pain;Muscle weakness (generalized) (M62.81) Pain - Right/Left: Left Pain - part of body: Leg                Time: 8257-4935 OT Time Calculation (min): 14 min Charges:  OT General Charges $OT Visit: 1 Visit OT Evaluation $OT Eval Low Complexity: 1 Low OT Treatments $Self Care/Home Management : 8-22 mins  Jeni Salles, MPH, MS, OTR/L ascom [phone] 08/07/18, 2:30 PM

## 2018-08-07 NOTE — Progress Notes (Signed)
PT Cancellation Note  Patient Details Name: [name] MRN: [id number] DOB: 1956-03-11   Cancelled Treatment:    Reason Eval/Treat Not Completed: Active bedrest order Patient on active best rest orders. Wait until ortho clearance per physician.   Janna Arch, PT, DPT   08/07/2018, 8:27 AM

## 2018-08-07 NOTE — Care Management Obs Status (Signed)
[place] NOTIFICATION   Patient Details  Name: [name] MRN: [id number] Date of Birth: Feb 01, 1956   Medicare Observation Status Notification Given:  Yes    Lavonte Palos A Liddie Chichester, RN 08/07/2018, 9:08 AM

## 2018-08-07 NOTE — Progress Notes (Signed)
OT Cancellation Note  Patient Details Name: [name] MRN: [id number] DOB: 09-27-1955   Cancelled Treatment:    Reason Eval/Treat Not Completed: Active bedrest order. Consult received, chart reviewed. Patient on active best rest. Pending ortho consult. Will continue to follow and re-attempt at later date/time as pt is medically appropriate.   [name], MPH, MS, OTR/L ascom [phone] 08/07/18, 8:50 AM

## 2018-08-07 NOTE — Clinical Social Work Note (Signed)
CSW had consult for possible need for placement. The patient is currently under observation status with traditional Medicare; however, she does have Medicaid. The CSW is waiting for PT evaluation prior to assessment and will follow once PT has seen the patient. PT is currently waiting for ortho consult.  Santiago Bumpers, MSW, Latanya Presser [phone]

## 2018-08-07 NOTE — Consult Note (Signed)
ORTHOPAEDIC CONSULTATION  REQUESTING PHYSICIAN: Fritzi Mandes, MD  Chief Complaint: Left hip pain  HPI: [name] is a 63 y.o. female who complains of left hip pain due to a posterior dislocation of her left total hip.  Patient was walking outside yesterday and started having back pain.  She bent over and then fell on her left hip which subsequently dislocated.  Since her index operation was in 2017 by Dr. Marry Guan.  She says she had another dislocation 2 years ago.  The dislocation was reduced by Dr. Clearnce Hasten in the emergency room.  Patient has multiple medical problems and also only has 1 arm so she was admitted for stabilization and possible skilled nursing placement.  She does live alone and suffers from alcohol abuse.  Today she is feels better and is resting comfortably.  Past Medical History:  Diagnosis Date  . Anxiety   . Anxiety    panic anxiety disorder  . Arthritis   . Asthma    chronic asthmatic bronchitis  . Bipolar disorder ([place])   . Cancer [facility])    Desmoid tumor left forearm  . Depression   . ETOH abuse   . Heart murmur   . Insomnia   . Neuropathy    Past Surgical History:  Procedure Laterality Date  . ABDOMINAL HYSTERECTOMY    . ABDOMINAL HYSTERECTOMY  2007  . 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 ARM AMPUTATION AT SHOULDER Left   . CESAREAN SECTION    . [place]   x 2  . ESOPHAGOGASTRODUODENOSCOPY (EGD) WITH PROPOFOL N/A 04/27/2018   Procedure: ESOPHAGOGASTRODUODENOSCOPY (EGD) WITH PROPOFOL;  Surgeon: Toledo, Benay Pike, MD;  Location: [facility] ENDOSCOPY;  Service: Gastroenterology;  Laterality: N/A;  . JOINT REPLACEMENT Right 2012   hip  . LUMBAR LAMINECTOMY/DECOMPRESSION MICRODISCECTOMY Left 04/16/2016   Procedure: LUMBAR LAMINECTOMY/DECOMPRESSION MICRODISCECTOMY 1 LEVEL;  Surgeon: Meade Maw, MD;  Location: [facility] ORS;  Service: Neurosurgery;  Laterality: Left;  Left L4-5 far lateral discectomy, left L4-5  laminoforaminotomy  . TOTAL HIP ARTHROPLASTY Left 09/17/2015   Procedure: TOTAL HIP ARTHROPLASTY;  Surgeon: Dereck Leep, MD;  Location: [facility] ORS;  Service: Orthopedics;  Laterality: Left;   Social History   Socioeconomic History  . Marital status: Married    Spouse name: Not on file  . Number of children: Not on file  . Years of education: Not on file  . Highest education level: Not on file  Occupational History  . Not on file  Social Needs  . Financial resource strain: Not on file  . Food insecurity:    Worry: Not on file    Inability: Not on file  . Transportation needs:    Medical: Not on file    Non-medical: Not on file  Tobacco Use  . Smoking status: Current Every Day Smoker    Packs/day: 0.25    Types: Cigarettes  . Smokeless tobacco: Never Used  Substance and Sexual Activity  . Alcohol use: Yes    Comment: 2 40oz beers daily  . Drug use: No    Comment: Patient denies   . Sexual activity: Yes  Lifestyle  . Physical activity:    Days per week: Not on file    Minutes per session: Not on file  . Stress: Not on file  Relationships  . Social connections:    Talks on phone: Not on file    Gets together: Not on file  Attends religious service: Not on file    Active member of club or organization: Not on file    Attends meetings of clubs or organizations: Not on file    Relationship status: Not on file  Other Topics Concern  . Not on file  Social History Narrative   ** Merged History Encounter **       Family History  Problem Relation Age of Onset  . COPD Mother    Allergies  Allergen Reactions  . Benadryl [Diphenhydramine] Hives and Other (See Comments)    Reaction:  Hyperactivity   . Benadryl [Diphenhydramine] Other (See Comments)    My jaw locked and I could not speak, like a stroke.  . Cephalosporins Hives  . Codeine Nausea And Vomiting  . Codeine Nausea Only    severe   Prior to Admission medications   Medication Sig Start Date End Date  Taking? Authorizing Provider  carbidopa-levodopa (SINEMET IR) 25-100 MG tablet Take 1 tablet by mouth 3 (three) times daily.   Yes [provider]  clonazePAM (KLONOPIN) 0.5 MG tablet Take 0.5 mg by mouth 2 (two) times daily as needed for anxiety.   Yes [provider]  famotidine (PEPCID) 20 MG tablet Take 20 mg by mouth 2 (two) times daily.   Yes [provider]  gabapentin (NEURONTIN) 300 MG capsule Take 3 capsules (900 mg total) by mouth 3 (three) times daily. 05/21/17  Yes Pucilowska, [name], MD  propranolol (INDERAL) 10 MG tablet Take 1 tablet (10 mg total) by mouth 3 (three) times daily. 05/21/17  Yes Pucilowska, [name], MD  traZODone (DESYREL) 150 MG tablet Take 150 mg by mouth at bedtime.    Yes [provider]  venlafaxine XR (EFFEXOR-XR) 150 MG 24 hr capsule Take 1 capsule (150 mg total) by mouth daily with breakfast. 05/21/17  Yes Pucilowska, Wardell Honour, MD   Dg Hip Unilat W Or Wo Pelvis 2-3 Views Left  Result Date: 08/06/2018 CLINICAL DATA:  Reduction of dislocated left hip prosthesis. EXAM: DG HIP (WITH OR WITHOUT PELVIS) 2-3V LEFT 3:57 p.m. COMPARISON:  Radiographs dated 08/06/2018 at 1:13 p.m. FINDINGS: There has been complete reduction of the dislocation of the femoral component of the left total hip prosthesis. No fracture.  Right hip prosthesis appears in good position. IMPRESSION: Complete reduction of the dislocation of the femoral component of the left hip prosthesis. Electronically Signed   By: Lorriane Shire M.D.   On: 08/06/2018 16:56   Dg Hip Unilat W Or Wo Pelvis 2-3 Views Left  Result Date: 08/06/2018 CLINICAL DATA:  [place] today.  Left hip pain. EXAM: DG HIP (WITH OR WITHOUT PELVIS) 2-3V LEFT COMPARISON:  Radiographs 09/17/2015 FINDINGS: The left femoral prosthesis is dislocated superiorly and posteriorly. No periprosthetic fractures identified. The right hip prosthesis is intact. The pubic symphysis and SI joints are intact. The bony  pelvis is intact. IMPRESSION: Superior posterior dislocation of the left femoral prosthesis. No acute fractures. Electronically Signed   By: Marijo Sanes M.D.   On: 08/06/2018 13:54    Positive ROS: All other systems have been reviewed and were otherwise negative with the exception of those mentioned in the HPI and as above.  Physical Exam: General: Alert, no acute distress Cardiovascular: No pedal edema Respiratory: No cyanosis, no use of accessory musculature GI: No organomegaly, abdomen is soft and non-tender Skin: No lesions in the area of chief complaint Neurologic: Sensation intact distally Psychiatric: Patient is competent for consent with normal mood and  affect Lymphatic: No axillary or cervical lymphadenopathy  MUSCULOSKELETAL: The left hip is stable.  There is mild pain with range of motion.  Lengths are good.  Neurovascular status is good.  No other injuries are noted.  Assessment: Recurrent dislocation left total hip replacement  Plan: Knee immobilizer to prevent hyperflexion. PT to mobilize patient. May need skilled nursing placement for short time. Will notify Dr. Marry Guan of her recent dislocation.    Park Breed, MD [phone]   08/07/2018 12:37 PM

## 2018-08-07 NOTE — Evaluation (Signed)
Physical Therapy Evaluation Patient Details Name: [name] MRN: [id number] DOB: 12-29-1955 Today's Date: 08/07/2018   History of Present Illness  Patient is a 63 year old female who presented for L hip pain secondary to falling after bending over from back pain. Resulted in L hip dislocation which was reduced while in the ER.  PMH includes cancer, insomnia, neuropathy, L arm amputation above elbow, lumbar laminectomy, Bil THA.   Clinical Impression  Patient is a pleasant 63 year old female who presents s/p reduction of L dislocated hip. Patient has bilateral THA's and a LUE amputation. Patient falls approximately 1x/month per her report and utilizes a cane at home if she has to stand. Patient's RLE is [facility] however LLE required repositioning of knee immobilizer with education to patient on placement of immobilizer. Multiple rest breaks required due to pain increase to patient. Patient requires Min-Mod A for LLE movement for supine to sit and Mod A for LLE placement for sit to supine. Sit to stand and stand pivot transfer to chair was attempted, however patient unable to tolerate standing, yelling upon attempt to place minimal weight onto LLE requiring Max A to return to sitting safely on EOB. Due to patient's limited tolerance/capabilities for mobility as well as limited ability to transfer/perform bed mobility patient will require placement in SNF upon d/c from hospital.  While patient is in the hospital she will benefit from skilled physical therapy to increase LE strength, improve safe transfers and bed mobility, and increase static and dynamic stability for reduction of fall risk.     Follow Up Recommendations SNF    Equipment Recommendations  Other (comment)(hemiwalker)    Recommendations for Other Services       Precautions / Restrictions Precautions Precautions: Fall Precaution Comments: Patient has single UE, utilizes cane at home., history of dislocation of replacement hips.   Required Braces or Orthoses: Knee Immobilizer - Left Knee Immobilizer - Left: On at all times Restrictions Weight Bearing Restrictions: Yes LLE Weight Bearing: Weight bearing as tolerated      Mobility  Bed Mobility Overal bed mobility: Needs Assistance Bed Mobility: Supine to Sit;Sit to Supine     Supine to sit: Min guard;Min assist;HOB elevated Sit to supine: Mod assist;HOB elevated   General bed mobility comments: Patient required Min A for LLE placement when performing supine to EOB, required Mod A for LLE to return to supine position.   Transfers Overall transfer level: Needs assistance Equipment used: Hemi-walker Transfers: Sit to/from Stand Sit to Stand: Mod assist;From elevated surface         General transfer comment: Attempted sit to stand with patient. Patient initially able to perform with Mod A however required Max to maintain due to exacerbation of pain upon attempt to place weight on LLE.  Unable to attempt stand pivot transfer to chair.   Ambulation/Gait             General Gait Details: unable to attempt due to patient's pain and ability to stand.   Stairs            Wheelchair Mobility    Modified Rankin (Stroke Patients Only)       Balance Overall balance assessment: Needs assistance;History of Falls Sitting-balance support: Feet supported Sitting balance-Leahy Scale: Fair Sitting balance - Comments: Able to sit with feet supported. Initially dizzy upon transition to sit requiring SUE support, however upon prolonged sitting was able to maintain without UE support.  Postural control: Right lateral lean Standing balance support:  Single extremity supported Standing balance-Leahy Scale: Poor Standing balance comment: Patient required Mod A for standing with hemiwalker.                              Pertinent Vitals/Pain Pain Assessment: 0-10 Pain Score: 7  Pain Location: low back down to left leg Pain Descriptors /  Indicators: Aching;Radiating;Throbbing Pain Intervention(s): Patient requesting pain meds-RN notified;Monitored during session    Home Living Family/patient expects to be discharged to:: Private residence Living Arrangements: Alone Available Help at Discharge: Friend(s) Type of Home: Apartment Home Access: Stairs to enter Entrance Stairs-Rails: None Entrance Stairs-Number of Steps: 3 Home Layout: One level Home Equipment: Cane - single point;Grab bars - tub/shower;Shower seat Additional Comments: Patient reports she was independent in basic ADLs and iADLS. Able to do short ambulation within home with cane but has been having multiple falls.     Prior Function Level of Independence: Independent with assistive device(s)         Comments: Patient reports she uses the cane around the house occasionally. Does not go out of her house often. Husband and her just seperated and she lives alone. Friends come by and help with the cleaning and shopping.      Hand Dominance   Dominant Hand: Right    Extremity/Trunk Assessment   Upper Extremity Assessment Upper Extremity Assessment: Defer to OT evaluation    Lower Extremity Assessment Lower Extremity Assessment: LLE deficits/detail;RLE deficits/detail RLE Deficits / Details: 4-/5 gross strength RLE Sensation: history of peripheral neuropathy LLE Deficits / Details: unable to fully assess due to L knee immobilizer. hip: 2/5, ankle 3+/5  LLE: Unable to fully assess due to pain;Unable to fully assess due to immobilization       Communication   Communication: No difficulties  Cognition Arousal/Alertness: Awake/alert Behavior During Therapy: WFL for tasks assessed/performed Overall Cognitive Status: Within Functional Limits for tasks assessed                                 General Comments: Patient responds to simple and complex requests.       General Comments General comments (skin integrity, edema, etc.): L knee  immobilizer had to be repositioned due to being too low.    Exercises General Exercises - Lower Extremity Ankle Circles/Pumps: AROM;Strengthening;Both;15 reps;Supine Hip ABduction/ADduction: AAROM;Strengthening;Left;5 reps;Supine Other Exercises Other Exercises: sitting EOB 5 minutes  Other Exercises: attempt sit to stands  Other Exercises: reposition knee immobilizer on L leg: multiple breaks required due to patient's pain.    Assessment/Plan    PT Assessment Patient needs continued PT services  PT Problem List Decreased strength;Decreased range of motion;Decreased activity tolerance;Decreased mobility;Decreased balance;Decreased knowledge of use of DME;Pain;Decreased knowledge of precautions       PT Treatment Interventions DME instruction;Gait training;Stair training;Therapeutic exercise;Therapeutic activities;Functional mobility training;Balance training;Neuromuscular re-education;Manual techniques;Patient/family education    PT Goals (Current goals can be found in the Care Plan section)  Acute Rehab PT Goals Patient Stated Goal: to return to standing and have less pain PT Goal Formulation: With patient Time For Goal Achievement: 08/21/18 Potential to Achieve Goals: Fair    Frequency 7X/week   Barriers to discharge Decreased caregiver support Patient lives alone.     Co-evaluation               AM-PAC PT "6 Clicks" Mobility  Outcome Measure Help needed turning from  your back to your side while in a flat bed without using bedrails?: A Little Help needed moving from lying on your back to sitting on the side of a flat bed without using bedrails?: A Lot Help needed moving to and from a bed to a chair (including a wheelchair)?: Total Help needed standing up from a chair using your arms (e.g., wheelchair or bedside chair)?: A Lot Help needed to walk in hospital room?: Total Help needed climbing 3-5 steps with a railing? : Total 6 Click Score: 10    End of Session  Equipment Utilized During Treatment: Gait belt;Left knee immobilizer Activity Tolerance: Patient limited by pain Patient left: in bed;with call bell/phone within reach;with bed alarm set Nurse Communication: Mobility status;Patient requests pain meds PT Visit Diagnosis: Other abnormalities of gait and mobility (R26.89);Muscle weakness (generalized) (M62.81);Unsteadiness on feet (R26.81);History of falling (Z91.81);Pain Pain - Right/Left: Left Pain - part of body: Leg;Hip    Time: 9833-8250 PT Time Calculation (min) (ACUTE ONLY): 43 min   Charges:   PT Evaluation $PT Eval Low Complexity: 1 Low PT Treatments $Therapeutic Exercise: 23-37 mins        Janna Arch, PT, DPT   Janna Arch 08/07/2018, 2:02 PM

## 2018-08-07 NOTE — Progress Notes (Signed)
[place] [facility] [place] NAME: [name]    MR#:  [id number]  DATE OF BIRTH:  23-May-1956  SUBJECTIVE:  complains of hip pain. Wants to make sure she gets her Klonopin REVIEW OF SYSTEMS:   Review of Systems  Constitutional: Negative for chills, fever and weight loss.  HENT: Negative for ear discharge, ear pain and nosebleeds.   Eyes: Negative for blurred vision, pain and discharge.  Respiratory: Negative for sputum production, shortness of breath, wheezing and stridor.   Cardiovascular: Negative for chest pain, palpitations, orthopnea and PND.  Gastrointestinal: Negative for abdominal pain, diarrhea, nausea and vomiting.  Genitourinary: Negative for frequency and urgency.  Musculoskeletal: Positive for joint pain. Negative for back pain.  Neurological: Negative for sensory change, speech change, focal weakness and weakness.  Psychiatric/Behavioral: Negative for depression and hallucinations. The patient is not nervous/anxious.    Tolerating Diet:yes Tolerating PT: SNF  DRUG ALLERGIES:   Allergies  Allergen Reactions  . Benadryl [Diphenhydramine] Hives and Other (See Comments)    Reaction:  Hyperactivity   . Benadryl [Diphenhydramine] Other (See Comments)    My jaw locked and I could not speak, like a stroke.  . Cephalosporins Hives  . Codeine Nausea And Vomiting  . Codeine Nausea Only    severe    VITALS:  Blood pressure (!) 106/53, pulse 68, temperature 98 F (36.7 C), temperature source Oral, resp. rate 18, height 5\' 5"  (1.651 m), weight 62.6 kg, SpO2 97 %.  PHYSICAL EXAMINATION:   Physical Exam  GENERAL:  63 y.o.-year-old patient lying in the bed with no acute distress.  EYES: Pupils equal, round, reactive to light and accommodation. No scleral icterus. Extraocular muscles intact.  HEENT: Head atraumatic, normocephalic. Oropharynx and nasopharynx clear.  NECK:  Supple, no jugular venous distention. No thyroid  enlargement, no tenderness.  LUNGS: Normal breath sounds bilaterally, no wheezing, rales, rhonchi. No use of accessory muscles of respiration.  CARDIOVASCULAR: S1, S2 normal. No murmurs, rubs, or gallops.  ABDOMEN: Soft, nontender, nondistended. Bowel sounds present. No organomegaly or mass.  EXTREMITIES: No cyanosis, clubbing or edema b/l.    NEUROLOGIC: Cranial nerves II through XII are intact. No focal Motor or sensory deficits b/l.   PSYCHIATRIC:  patient is alert and oriented x 3.  SKIN: No obvious rash, lesion, or ulcer.   LABORATORY PANEL:  CBC Recent Labs  Lab 08/06/18 1238  WBC 9.0  HGB 14.2  HCT 41.1  PLT 208    Chemistries  Recent Labs  Lab 08/06/18 1238 08/07/18 0354  NA 128* 138  K 3.8 3.8  CL 95* 110  CO2 23 24  GLUCOSE 100* 98  BUN 15 20  CREATININE 0.92 1.18*  CALCIUM 8.8* 8.0*  AST 16  --   ALT 6  --   ALKPHOS 64  --   BILITOT 0.7  --    Cardiac Enzymes No results for input(s): TROPONINI in the last 168 hours. RADIOLOGY:  Dg Hip Unilat W Or Wo Pelvis 2-3 Views Left  Result Date: 08/06/2018 CLINICAL DATA:  Reduction of dislocated left hip prosthesis. EXAM: DG HIP (WITH OR WITHOUT PELVIS) 2-3V LEFT 3:57 p.m. COMPARISON:  Radiographs dated 08/06/2018 at 1:13 p.m. FINDINGS: There has been complete reduction of the dislocation of the femoral component of the left total hip prosthesis. No fracture.  Right hip prosthesis appears in good position. IMPRESSION: Complete reduction of the dislocation of the femoral component of the left hip prosthesis. Electronically Signed  By: Lorriane Shire M.D.   On: 08/06/2018 16:56   Dg Hip Unilat W Or Wo Pelvis 2-3 Views Left  Result Date: 08/06/2018 CLINICAL DATA:  [place] today.  Left hip pain. EXAM: DG HIP (WITH OR WITHOUT PELVIS) 2-3V LEFT COMPARISON:  Radiographs 09/17/2015 FINDINGS: The left femoral prosthesis is dislocated superiorly and posteriorly. No periprosthetic fractures identified. The right hip prosthesis is  intact. The pubic symphysis and SI joints are intact. The bony pelvis is intact. IMPRESSION: Superior posterior dislocation of the left femoral prosthesis. No acute fractures. Electronically Signed   By: Marijo Sanes M.D.   On: 08/06/2018 13:54   ASSESSMENT AND PLAN:    [name]  is a 63 y.o. female with a known history of a bilateral hip replacements as well as hip dislocation, sustained a fall after patient bent over after some acute back pain, resulted in acute leg pain/inability to stand/ambulate, brought to the emergency room for further evaluation/care, in the emergency room patient was found to have acute dislocated hip prosthesis . *Acute left dislocated hip prosthesis Noted history of dislocation in the past, status post reduction in the emergency room Referred to the observation unit, adult pain protocol, orthopedic surgery to see, PT/OT to evaluate/treat  *Acute hyponatremia/hypochloremia Replete with IV fluids rehydration -resolved with IVF -tolerating po  *History of alcohol abuse Alcohol withdrawal protocol while in house no symptoms of withdrawal. I will DC Ativan. Will continue BID Klonopin PRN  *History of asthma without exacerbation Stable Breathing treatments PRN  *Chronic bipolar illness, anxiety, panic disorder Stable Continue home psychotropic regiment   Social worker for discharge planning. Physical therapy recommendations noted. Patient also wants to try go to rehab. Lives at home by herself. She has friends who help her.  Case discussed with Care Management/Social Worker.   CODE STATUS: full DVT Prophylaxis: Lovenox TOTAL TIME TAKING CARE OF THIS PATIENT: **30* minutes.  >50% time spent on counselling and coordination of care  POSSIBLE D/C IN 1 to 2 DAYS, DEPENDING ON CLINICAL CONDITION.  Note: This dictation was prepared with Dragon dictation along with smaller phrase technology. Any transcriptional errors that result from this process are  unintentional.  Fritzi Mandes M.D on 08/07/2018 at 2:12 PM  Between 7am to 6pm - Pager - [phone]  After 6pm go to [email or web address] - password [email or web address] [facility]  Office  [phone]  CC: Primary care physician; Dion Body, MDPatient ID: [name], female   DOB: 1955-12-22, 63 y.o.   MRN: [id number]

## 2018-08-08 ENCOUNTER — Observation Stay: Payer: Medicare Other

## 2018-08-08 DIAGNOSIS — T84021A Dislocation of internal left hip prosthesis, initial encounter: Secondary | ICD-10-CM | POA: Diagnosis not present

## 2018-08-08 LAB — HIV ANTIBODY (ROUTINE TESTING W REFLEX): HIV SCREEN 4TH GENERATION: NONREACTIVE

## 2018-08-08 MED ORDER — ADULT MULTIVITAMIN W/MINERALS CH: 1.0000 | ORAL_TABLET | Freq: Every day | ORAL | 0 refills | Status: DC

## 2018-08-08 MED ORDER — GABAPENTIN 300 MG PO CAPS
300.0000 mg | ORAL_CAPSULE | Freq: Three times a day (TID) | ORAL | Status: DC
  Administered 2018-08-08 – 2018-08-09 (×5): 300 mg via ORAL
  Filled 2018-08-08 (×5): qty 1

## 2018-08-08 MED ORDER — HYDROCODONE-ACETAMINOPHEN 5-325 MG PO TABS: 1.0000 | ORAL_TABLET | ORAL | 0 refills | Status: DC | PRN

## 2018-08-08 MED ORDER — ALBUTEROL SULFATE (2.5 MG/3ML) 0.083% IN NEBU: 2.5000 mg | INHALATION_SOLUTION | Freq: Four times a day (QID) | RESPIRATORY_TRACT | Status: DC | PRN

## 2018-08-08 NOTE — Discharge Summary (Signed)
[facility] NAME: [name]    MR#:  [id number]  DATE OF BIRTH:  11-04-1955  DATE OF ADMISSION:  08/06/2018 ADMITTING PHYSICIAN: Gorden Harms, MD  DATE OF DISCHARGE: 08/08/2018  PRIMARY CARE PHYSICIAN: Dion Body, MD    ADMISSION DIAGNOSIS:  Hyponatremia [E87.1] Dislocation of left hip, initial encounter ([place]) [S73.005A] Fall, initial encounter [W19.XXXA]  DISCHARGE DIAGNOSIS:  left hip dislocation recurrent status post reduction left knee swelling-- no acute fracture noted on x-ray SECONDARY DIAGNOSIS:   Past Medical History:  Diagnosis Date  . Anxiety   . Anxiety    panic anxiety disorder  . Arthritis   . Asthma    chronic asthmatic bronchitis  . Bipolar disorder ([place])   . Cancer [facility])    Desmoid tumor left forearm  . Depression   . ETOH abuse   . Heart murmur   . Insomnia   . Neuropathy     HOSPITAL COURSE:   [name] a62 y.o.femalewith a known history ofa bilateral hip replacements as well as hip dislocation, sustained a fall after patient bent over after some acute back pain, resulted in acute leg pain/inability to stand/ambulate, brought to the emergency room for further evaluation/care, in the emergency room patient was found to have acute dislocated hip prosthesis .  *Acute left dislocated hip prosthesis Noted history of dislocation in the past, status post reduction in the emergency room Prn pain protocol, orthopedic surgery input by Dr Sabra Heck appreciated. -PT/OT evaluation noted.-- Patient prefers to go home with home health PT  *Left knee swelling status post fall. X-ray left knee complete no acute fracture noted. Some effusion noted locally. X-ray was reviewed by Dr. Sabra Heck. -Patient will follow-up with Dr. Bess Harvest in 10 days and further evaluation of left hip and left knee per outpatient follow-up.  *Acute hyponatremia/hypochloremia--improved Repleted with IV fluids  rehydration -tolerating po  *History of alcohol abuse Alcohol withdrawal protocol while in house no symptoms of withdrawal. I will DC Ativan. Will continue BID Klonopin PRN  *History of asthma without exacerbation Stable Breathing treatments PRN  *Chronic bipolar illness, anxiety, panic disorder Stable Continue home psychotropic regiment   Social worker for discharge planning input noted. Patient wants to go home with home health PT and aid. She has friends who can help her out according to her.  Patient is stable from orthopedic standpoint for discharge.  Discharge home today. Patient informed. CONSULTS OBTAINED:  Treatment Team:  Earnestine Leys, MD  DRUG ALLERGIES:   Allergies  Allergen Reactions  . Benadryl [Diphenhydramine] Hives and Other (See Comments)    Reaction:  Hyperactivity   . Benadryl [Diphenhydramine] Other (See Comments)    My jaw locked and I could not speak, like a stroke.  . Cephalosporins Hives  . Codeine Nausea And Vomiting  . Codeine Nausea Only    severe    DISCHARGE MEDICATIONS:   Allergies as of 08/08/2018      Reactions   Benadryl [diphenhydramine] Hives, Other (See Comments)   Reaction:  Hyperactivity    Benadryl [diphenhydramine] Other (See Comments)   My jaw locked and I could not speak, like a stroke.   Cephalosporins Hives   Codeine Nausea And Vomiting   Codeine Nausea Only   severe      Medication List    TAKE these medications   carbidopa-levodopa 25-100 MG tablet Commonly known as:  SINEMET IR Take 1 tablet by mouth 3 (three) times daily.   clonazePAM  0.5 MG tablet Commonly known as:  KLONOPIN Take 0.5 mg by mouth 2 (two) times daily as needed for anxiety.   famotidine 20 MG tablet Commonly known as:  PEPCID Take 20 mg by mouth 2 (two) times daily.   gabapentin 300 MG capsule Commonly known as:  NEURONTIN Take 3 capsules (900 mg total) by mouth 3 (three) times daily.   HYDROcodone-acetaminophen 5-325 MG  tablet Commonly known as:  NORCO/VICODIN Take 1-2 tablets by mouth every 4 (four) hours as needed for moderate pain.   multivitamin with minerals Tabs tablet Take 1 tablet by mouth daily. Start taking on:  August 09, 2018   propranolol 10 MG tablet Commonly known as:  INDERAL Take 1 tablet (10 mg total) by mouth 3 (three) times daily.   traZODone 150 MG tablet Commonly known as:  DESYREL Take 150 mg by mouth at bedtime.   venlafaxine XR 150 MG 24 hr capsule Commonly known as:  EFFEXOR-XR Take 1 capsule (150 mg total) by mouth daily with breakfast.       If you experience worsening of your admission symptoms, develop shortness of breath, life threatening emergency, suicidal or homicidal thoughts you must seek medical attention immediately by calling 911 or calling your MD immediately  if symptoms less severe.  You Must read complete instructions/literature along with all the possible adverse reactions/side effects for all the Medicines you take and that have been prescribed to you. Take any new Medicines after you have completely understood and accept all the possible adverse reactions/side effects.   Please note  You were cared for by a hospitalist during your hospital stay. If you have any questions about your discharge medications or the care you received while you were in the hospital after you are discharged, you can call the unit and asked to speak with the hospitalist on call if the hospitalist that took care of you is not available. Once you are discharged, your primary care physician will handle any further medical issues. Please note that NO REFILLS for any discharge medications will be authorized once you are discharged, as it is imperative that you return to your primary care physician (or establish a relationship with a primary care physician if you do not have one) for your aftercare needs so that they can reassess your need for medications and monitor your lab  values. Today   SUBJECTIVE   Left knee pain  VITAL SIGNS:  Blood pressure 104/65, pulse 65, temperature 98.7 F (37.1 C), temperature source Oral, resp. rate 18, height 5\' 5"  (1.651 m), weight 62.6 kg, SpO2 97 %.  I/O:    Intake/Output Summary (Last 24 hours) at 08/08/2018 1106 Last data filed at 08/08/2018 0458 Gross per 24 hour  Intake -  Output 1100 ml  Net -1100 ml    PHYSICAL EXAMINATION:  GENERAL:  63 y.o.-year-old patient lying in the bed with no acute distress.  EYES: Pupils equal, round, reactive to light and accommodation. No scleral icterus. Extraocular muscles intact.  HEENT: Head atraumatic, normocephalic. Oropharynx and nasopharynx clear.  NECK:  Supple, no jugular venous distention. No thyroid enlargement, no tenderness.  LUNGS: Normal breath sounds bilaterally, no wheezing, rales,rhonchi or crepitation. No use of accessory muscles of respiration.  CARDIOVASCULAR: S1, S2 normal. No murmurs, rubs, or gallops.  ABDOMEN: Soft, non-tender, non-distended. Bowel sounds present. No organomegaly or mass.  EXTREMITIES: No pedal edema, cyanosis, or clubbing. Left leg brace+ (green) NEUROLOGIC: Cranial nerves II through XII are intact. Muscle strength 5/5 in  all extremities. Sensation intact. Gait not checked.  PSYCHIATRIC: The patient is alert and oriented x 3.  SKIN: No obvious rash, lesion, or ulcer.   DATA REVIEW:   CBC  Recent Labs  Lab 08/06/18 1238  WBC 9.0  HGB 14.2  HCT 41.1  PLT 208    Chemistries  Recent Labs  Lab 08/06/18 1238 08/07/18 0354  NA 128* 138  K 3.8 3.8  CL 95* 110  CO2 23 24  GLUCOSE 100* 98  BUN 15 20  CREATININE 0.92 1.18*  CALCIUM 8.8* 8.0*  AST 16  --   ALT 6  --   ALKPHOS 64  --   BILITOT 0.7  --     Microbiology Results   No results found for this or any previous visit (from the past 240 hour(s)).  RADIOLOGY:  Dg Knee Complete 4 Views Left  Result Date: 08/08/2018 CLINICAL DATA:  Acute onset of LEFT knee pain  that began yesterday after the patient's dislocated LEFT hip prosthesis was reduced. EXAM: LEFT KNEE - COMPLETE 4+ VIEW COMPARISON:  None. FINDINGS: Osseous demineralization. No visible fracture, though there is a fat fluid level in the suprapatellar bursa. Moderate MEDIAL and LATERAL compartment joint space narrowing. Patellofemoral joint space well-preserved. IMPRESSION: 1. No visible acute fractures. However, a fat fluid level is present in the joint space in the suprapatellar bursa which can be present with an occult fracture. MRI of the LEFT knee with contrast may be helpful in further evaluation to exclude an occult fracture. 2. Moderate MEDIAL and LATERAL compartment osteoarthritis. 3. Osseous demineralization. Electronically Signed   By: Evangeline Dakin M.D.   On: 08/08/2018 10:00   Dg Hip Unilat W Or Wo Pelvis 2-3 Views Left  Result Date: 08/06/2018 CLINICAL DATA:  Reduction of dislocated left hip prosthesis. EXAM: DG HIP (WITH OR WITHOUT PELVIS) 2-3V LEFT 3:57 p.m. COMPARISON:  Radiographs dated 08/06/2018 at 1:13 p.m. FINDINGS: There has been complete reduction of the dislocation of the femoral component of the left total hip prosthesis. No fracture.  Right hip prosthesis appears in good position. IMPRESSION: Complete reduction of the dislocation of the femoral component of the left hip prosthesis. Electronically Signed   By: Lorriane Shire M.D.   On: 08/06/2018 16:56   Dg Hip Unilat W Or Wo Pelvis 2-3 Views Left  Result Date: 08/06/2018 CLINICAL DATA:  [place] today.  Left hip pain. EXAM: DG HIP (WITH OR WITHOUT PELVIS) 2-3V LEFT COMPARISON:  Radiographs 09/17/2015 FINDINGS: The left femoral prosthesis is dislocated superiorly and posteriorly. No periprosthetic fractures identified. The right hip prosthesis is intact. The pubic symphysis and SI joints are intact. The bony pelvis is intact. IMPRESSION: Superior posterior dislocation of the left femoral prosthesis. No acute fractures. Electronically  Signed   By: Marijo Sanes M.D.   On: 08/06/2018 13:54     CODE STATUS:     Code Status Orders  (From admission, onward)         Start     Ordered   08/06/18 1931  Full code  Continuous     08/06/18 1930        Code Status History    Date Active Date Inactive Code Status Order ID Comments User Context   05/11/2017 1604 05/21/2017 1521 Full Code [id number]  Gonzella Lex, MD Inpatient   05/11/2017 1604 05/11/2017 1604 Full Code [id number]  Gonzella Lex, MD Inpatient   05/08/2017 1615 05/11/2017 1532 Full Code [id number]  Loletha Grayer, MD ED  07/05/2016 0438 07/11/2016 2023 Full Code [id number]  Gonzella Lex, MD Inpatient   07/04/2015 1503 07/09/2015 1915 Full Code [id number]  Hildred Priest, MD Inpatient   07/03/2015 2358 07/04/2015 1503 Full Code [id number]  Gonzella Lex, MD Inpatient   06/30/2015 2018 07/03/2015 2358 Full Code [id number]  Loletha Grayer, MD ED   01/25/2015 1426 01/30/2015 2050 Full Code [id number]  Gonzella Lex, MD Inpatient   01/08/2015 1244 01/10/2015 1545 Full Code [id number]  Henreitta Leber, MD Inpatient   12/21/2013 1716 12/27/2013 1140 Full Code [id number]  Clarene Reamer, MD Inpatient   12/21/2013 1013 12/21/2013 1716 Full Code [id number]  Virgel Manifold, MD ED      TOTAL TIME TAKING CARE OF THIS PATIENT: *40* minutes.    Fritzi Mandes M.D on 08/08/2018 at 11:06 AM  Between 7am to 6pm - Pager - [phone] After 6pm go to [email or web address] - password [email or web address] [place] [facility]  Office  [phone]  CC: Primary care physician; Dion Body, MD

## 2018-08-08 NOTE — Progress Notes (Signed)
Physical Therapy Treatment Patient Details Name: [name] MRN: [id number] DOB: 09-23-1955 Today's Date: 08/08/2018    History of Present Illness Patient is a 63 year old female who presented for L hip pain secondary to falling after bending over from back pain. Resulted in L hip dislocation which was reduced while in the ER.  PMH includes cancer, insomnia, neuropathy, L arm amputation above elbow, lumbar laminectomy, Bil THA.     PT Comments    Upon arrival this morning pt reporting L knee pain. Knee immobilizer removed to reveal swollen and tender L knee. Dr. Posey Pronto ordered radiographs which showed no acute fracture but fluid in the joint space of the suprapatellar bursa. Pt seen by orthopedist who recommended follow-up in clinic. Pt returned to work with patient and she remains in knee immobilizer for the entire visit secondary to L hip dislocation. Pt educated about posterior hip precautions. She requires minA+1 to go from sit to stand due to imbalance. Pt uses hemiwalker for transfer and ambulation. Limited weight shifting to LLE secondary to knee pain during transfer. Once standing she is stable with RUE support on hemiwalker. Pt is able to ambulate approximately 6' with hemiwalker in [place]. Unable to utilize rolling walker due to LUE amputation. Step-to pattern utilized. Pt is very unsteady during ambulation with minimal weight acceptance to LLE secondary to knee pain. Ambulation also complicated by need for L KI for all mobility secondary to L hip dislocation and well as RUE support only on assistive device. Pt almost falls twice requiring therapist assist to prevent her from falling. Unable to safely ambulate farther at this time. PT recommending SNF placement at this time however pt refusing. Pt reports she feels like she is being discharged too soon so message forwarded to RN and MD. Pt will benefit from PT services to address deficits in strength, balance, and mobility in order to return to  full function at home.    Follow Up Recommendations  SNF     Equipment Recommendations  Other (comment)(Hemiwalker)    Recommendations for Other Services       Precautions / Restrictions Precautions Precautions: Fall Precaution Comments: Patient has single UE (LUE amputation), utilizes cane at home., history of dislocation of L hip replacement previously Required Braces or Orthoses: Knee Immobilizer - Left Knee Immobilizer - Left: On at all times Restrictions Weight Bearing Restrictions: No LLE Weight Bearing: Weight bearing as tolerated Other Position/Activity Restrictions: Reviewed posterior hip precautions with patient including L hip flexion <90 degrees, no L hip IR, and no L hip adduction past neutral    Mobility  Bed Mobility Overal bed mobility: Needs Assistance Bed Mobility: Supine to Sit;Sit to Supine     Supine to sit: Min guard Sit to supine: Min guard   General bed mobility comments: Pt is able to perform bed mobility with only cues from therapist to avoid violating hip precautions. She moves slowly but is able to perform without assistance  Transfers Overall transfer level: Needs assistance Equipment used: Hemi-walker Transfers: Sit to/from Stand Sit to Stand: Min assist;From elevated surface         General transfer comment: Pt requires minA+1 to go from sit to stand due to imbalance. She uses hemiwalker for transfer. Limited weight shifting to LLE secondary to knee pain. Once standing she is stable with RUE support on hemiwalker  Ambulation/Gait Ambulation/Gait assistance: Mod assist Gait Distance (Feet): 6 Feet Assistive device: Hemi-walker Gait Pattern/deviations: Step-to pattern     General Gait Details:  Pt is able to ambulate approximately 6' with hemiwalker in [place]. Step-to pattern utilized. Pt is very unsteady with minimal weight acceptance to LLE secondary to knee pain. Ambulation also complicated by need for L KI for all mobility secondary to  L hip dislocation. Pt almost falls twice requiring therapist assist to prevent her from falling. Unable to safely ambulate farther at this time   Stairs             Wheelchair Mobility    Modified Rankin (Stroke Patients Only)       Balance Overall balance assessment: Needs assistance Sitting-balance support: No upper extremity supported Sitting balance-Leahy Scale: Good     Standing balance support: Single extremity supported Standing balance-Leahy Scale: Fair Standing balance comment: Pt able to remain standing with RUE support on hemiwalker                            Cognition Arousal/Alertness: Awake/alert Behavior During Therapy: WFL for tasks assessed/performed Overall Cognitive Status: Within Functional Limits for tasks assessed                                        Exercises      General Comments        Pertinent Vitals/Pain Pain Assessment: 0-10 Pain Score: 7  Pain Location: L knee Pain Descriptors / Indicators: Aching Pain Intervention(s): Monitored during session;Other (comment)(MD notified of L knee swelling)    Home Living                      Prior Function            PT Goals (current goals can now be found in the care plan section) Acute Rehab PT Goals Patient Stated Goal: be able to walk, take care of my cats, and have less pain PT Goal Formulation: With patient Time For Goal Achievement: 08/21/18 Potential to Achieve Goals: Fair Progress towards PT goals: Progressing toward goals    Frequency    7X/week      PT Plan Current plan remains appropriate    Co-evaluation              AM-PAC PT "6 Clicks" Mobility   Outcome Measure  Help needed turning from your back to your side while in a flat bed without using bedrails?: None Help needed moving from lying on your back to sitting on the side of a flat bed without using bedrails?: None Help needed moving to and from a bed to a chair  (including a wheelchair)?: A Lot Help needed standing up from a chair using your arms (e.g., wheelchair or bedside chair)?: A Little Help needed to walk in hospital room?: A Lot Help needed climbing 3-5 steps with a railing? : Total 6 Click Score: 16    End of Session Equipment Utilized During Treatment: Gait belt Activity Tolerance: Patient limited by pain Patient left: in bed;with call bell/phone within reach;with bed alarm set Nurse Communication: Mobility status;Other (comment)(Pt has concerns discharging home) PT Visit Diagnosis: Other abnormalities of gait and mobility (R26.89);Muscle weakness (generalized) (M62.81);Unsteadiness on feet (R26.81);History of falling (Z91.81);Pain Pain - Right/Left: Left Pain - part of body: Knee     Time: 1125-1145 PT Time Calculation (min) (ACUTE ONLY): 20 min  Charges:  $Gait Training: 8-22 mins  Lyndel Safe Huprich PT, DPT, GCS    Huprich,Jason 08/08/2018, 1:00 PM

## 2018-08-08 NOTE — Progress Notes (Signed)
Patient is being discharged to home with home health PT and aid. DC & Rx instructions given and patient acknowledged understanding. IV removed. Belongings packed.

## 2018-08-08 NOTE — Progress Notes (Signed)
Subjective:    Left hip pain is mild to moderate today.  She complains of swelling of the left knee overnight.  X-rays have been done which show effusion in the knee but no obvious fracture to my exam.  There appears that rehabilitation be quite slow.  She will need skilled nursing placement.  Patient reports pain as moderate.  Objective:   VITALS:   Vitals:   08/07/18 2337 08/08/18 0744  BP: (!) 115/55 104/65  Pulse: 72 65  Resp: 18   Temp: 97.9 F (36.6 C) 98.7 F (37.1 C)  SpO2: 95% 97%    Neurologically intact ABD soft Neurovascular intact Sensation intact distally Intact pulses distally Dorsiflexion/Plantar flexion intact  LABS Recent Labs    08/06/18 1238  HGB 14.2  HCT 41.1  WBC 9.0  PLT 208    Recent Labs    08/06/18 1238 08/07/18 0354  NA 128* 138  K 3.8 3.8  BUN 15 20  CREATININE 0.92 1.18*  GLUCOSE 100* 98    No results for input(s): LABPT, INR in the last 72 hours.   Assessment/Plan:      Up with therapy Discharge to SNF   Enteric-coated aspirin 325 mg twice daily on discharge  Follow-up with Dr. Marry Guan in 10 days after discharge

## 2018-08-08 NOTE — Care Management Note (Signed)
Case Management Note  Patient Details  Name: [name] MRN: [id number] Date of Birth: May 04, 1956  Subjective/Objective:                  Patient to be discharged per MD order. Orders in place for home health services. Patient has already been set up for home health via [facility]. Notified Cassie of patients discharge  Action/Plan:   Expected Discharge Date:  08/08/18               Expected Discharge Plan:  [place]  In-House Referral:  Clinical Social Work  Discharge planning Services  CM Consult  Post Acute Care Choice:  Home Health Choice offered to:  Patient  DME Arranged:    DME Agency:     HH Arranged:  RN, PT, OT, Nurse's Aide, Social Work [employer] Agency:  [facility]  Status of Service:  Completed, signed off  If discussed at [employer], dates discussed:    Additional Comments:  Latanya Maudlin, RN 08/08/2018, 11:35 AM

## 2018-08-08 NOTE — Progress Notes (Signed)
PT Hold Note  Patient Details Name: LIVI MCGANN MRN: [id number] DOB: 04-Oct-1955   Hold Treatment:    Reason Eval/Treat Not Completed: Patient not medically ready. Chart reviewed. Attempted to see patient. Upon arrival pt complaining of significant knee pain rated as 7/10. L knee immobilizer removed to inspect L knee. Significant swelling noted around L knee. Pt is exquisitely painful to palpation of L patella and around L knee joint. No bruising or redness noted. Pt reports MOI for L hip dislocation was fall onto L side where she struck her L knee, hip, and head. No further imaging in chart found of L knee since injury. MD Posey Pronto notified of L knee pain and she ordered plain films of L knee. Will follow for results and attempt to see patient later today once imaging is clear.  Lyndel Safe Cattaleya Wien PT, DPT, GCS  Dilia Alemany 08/08/2018, 9:09 AM

## 2018-08-08 NOTE — Care Management (Addendum)
Patient has been medically cleared and ordered for discharge. Patient is refusing to leave at this time because she feels unsafe with her discharge planning. CSW discussed with patient that "The patient can use her secondary insurance (Medicaid) for payment; however, she would have a 30-day stay as well as needing to relinquish any disability payments to the facility to cover room/board." After learning of this the patient no longer wanted to pursue rehab. Our team also informed her it could not be billed through Medicare as she is in observation status and she would require a three night inpatient stay to meet Medicare guidelines. Unfortunately, the patient does not meet inpatient criteria therefore the patient must stay in observation status. Patient had previously agreed to home health via [facility] and RNCM was able to coordinate a start of care date for tomorrow. Patient is currently agreeable to home health but wants to stay "another day or so".  Since she has been medically cleared I informed her that there is a risk of non coverage with her insurance. This RNCM thoroughly explained an ABN letter to her. We discussed why she did not meet inpatient criteria, the fact that she has been cleared for discharge by two physicians and that per Epic, she could be charged approximately $2600 per night stay, per the [place] letter. Patient acknowledged her understanding of the document and signed the document. I provided the patient with a copy of it and placed the other in the chart. Patients current desire is to work with PT in the am, "see how she feels" and go home via EMS. Patients caregiver/firned Leslie Dales [phone] was at the bedside for our conversation and in agreement with the plan. She plans to meet with  [facility] tomorrow to potentially meet with someone to discuss private care services. She also requests an in home social worker which has been added to the home health orders. RNCM has update  the bedside RN, CSW, MD and nursing supervisor of the situation. RNCM will follow up tomorrow. Cassie with [facility] is aware of the situation and would like to be notified before the patient discharges so services can be established as soon as possible.

## 2018-08-09 DIAGNOSIS — T84021A Dislocation of internal left hip prosthesis, initial encounter: Secondary | ICD-10-CM | POA: Diagnosis not present

## 2018-08-09 NOTE — Progress Notes (Signed)
Physical Therapy Treatment Patient Details Name: [name] MRN: [id number] DOB: [birth year] Today's Date: 08/09/2018    History of Present Illness Patient is a 63 year old female who presented for L hip pain secondary to falling after bending over from back pain. Resulted in L hip dislocation which was reduced while in the ER.  PMH includes cancer, insomnia, neuropathy, L arm amputation above elbow, lumbar laminectomy, Bil THA.     PT Comments    Pt agreeable to PT; reports soreness in L hip and 7/10 pain in L knee. Knee immobilizer donned throughout session. Pt demonstrates Mod I with bed mobility. Pt reports fear of standing on LLE due to knee pain that becomes more intense/sharp with weight bearing. Pt also subjectively reports recent (Aug 2019 diagnosis of Parkinsonism/disease) noting this is causing her difficulty with balance as well. +2 assist utilized throughout transfers/ambulation segments of treatment. Pt does display some difficulty with initiating stand with increased forward flexion and increased time to come to full upright stance. Education on static stand weight shift to self assess tolerable weight acceptance before ambulating. Also educated on appropriate step lengths, 3 point sequence gait pattern and appropriate placement of hemi walker for successful ambulation and limited pain. Pt demonstrates improvement throughout 2 walks (14 ft and 22 ft) with seated rest period in between. Pt educated on several exercises; pt well known to exercises due to extensive rehab in the past. Pt received up in chair comfortably. Pt does have 4 STE home, which will pose a challenge at this time. If pt unable to discharge to a rehab facility, pt will best be served with EMS services into the home and will be safest with 24 hour care. Continue PT to progress strength, balance and safety to improve all functional mobility.    Follow Up Recommendations  SNF;Supervision/Assistance - 24 hour      Equipment Recommendations       Recommendations for Other Services       Precautions / Restrictions Precautions Precautions: Fall Precaution Comments: Patient has single UE (LUE amputation), utilizes cane at home., history of dislocation of L hip replacement previously Required Braces or Orthoses: Knee Immobilizer - Left Knee Immobilizer - Left: On at all times Restrictions Weight Bearing Restrictions: Yes LLE Weight Bearing: Weight bearing as tolerated    Mobility  Bed Mobility Overal bed mobility: Modified Independent Bed Mobility: Supine to Sit     Supine to sit: Modified independent (Device/Increase time);HOB elevated     General bed mobility comments: Demonstrates good ability to SLR LLE and mobilize out of bed to the R. Use of rail to self assist trunk  Transfers Overall transfer level: Needs assistance Equipment used: Hemi-walker Transfers: Sit to/from Stand Sit to Stand: Min assist;+2 physical assistance(would be Mod of 1)         General transfer comment: mild difficulty to initiate stand due to LLE immobilizer (as well as other co morbidities/general weaknesses). Pt stands with extreme fwd flexion initially to rise decreasing overall balance. Mild increased time to come to full upright stance.   Ambulation/Gait Ambulation/Gait assistance: Min assist;+2 physical assistance;+2 safety/equipment Gait Distance (Feet): 14 Feet(22 ft second walk) Assistive device: Hemi-walker Gait Pattern/deviations: Step-to pattern Gait velocity: slow Gait velocity interpretation: <1.31 ft/sec, indicative of household ambulator General Gait Details: initial stand with weight shift to educate pt on self assessment of "how much weight" pt can tolerate on LLE safely. Education/verbal cues on appropriate 3 point sequence, appropriate step lengths and placement of  hemiwalker. Pt continues to show improvement in quality and ability to accept weight through LLE with decreased heaviness of  R step.    Stairs Stairs: (unable at this time. 4 STE apartment)           Wheelchair Mobility    Modified Rankin (Stroke Patients Only)       Balance Overall balance assessment: Needs assistance Sitting-balance support: No upper extremity supported Sitting balance-Leahy Scale: Good     Standing balance support: Single extremity supported Standing balance-Leahy Scale: Fair                              Cognition Arousal/Alertness: Awake/alert Behavior During Therapy: WFL for tasks assessed/performed Overall Cognitive Status: Within Functional Limits for tasks assessed                                        Exercises General Exercises - Lower Extremity Ankle Circles/Pumps: AROM;Both;20 reps Quad Sets: Strengthening;Both;10 reps;Standing;Other (comment)(with weight shift; also 10 in long sit) Gluteal Sets: Strengthening;Both;20 reps Straight Leg Raises: Strengthening;Left;10 reps;Supine;Seated(10x each position) Other Exercises Other Exercises: educated on household management regarding use of BSC, short household ambulation distances Neurosurgeon). Daily HEP (pt very familiar with exercises due to much rehab in the past0    General Comments        Pertinent Vitals/Pain Pain Assessment: 0-10 Pain Score: 7  Pain Location: L knee(Sore L hip) Pain Descriptors / Indicators: Aching;Constant;Sore;Sharp Pain Intervention(s): Premedicated before session;Repositioned;Ice applied;Monitored during session    Home Living                      Prior Function            PT Goals (current goals can now be found in the care plan section) Progress towards PT goals: Progressing toward goals    Frequency    7X/week      PT Plan Current plan remains appropriate    Co-evaluation              AM-PAC PT "6 Clicks" Mobility   Outcome Measure  Help needed turning from your back to your side while in a flat bed without  using bedrails?: None Help needed moving from lying on your back to sitting on the side of a flat bed without using bedrails?: None Help needed moving to and from a bed to a chair (including a wheelchair)?: A Little Help needed standing up from a chair using your arms (e.g., wheelchair or bedside chair)?: A Lot Help needed to walk in hospital room?: A Lot Help needed climbing 3-5 steps with a railing? : Total 6 Click Score: 16    End of Session Equipment Utilized During Treatment: Gait belt Activity Tolerance: Patient tolerated treatment well;Patient limited by pain Patient left: in chair;with call bell/phone within reach;with chair alarm set Nurse Communication: Mobility status;Other (comment)(STE home) PT Visit Diagnosis: Other abnormalities of gait and mobility (R26.89);Muscle weakness (generalized) (M62.81);Unsteadiness on feet (R26.81);History of falling (Z91.81);Pain Pain - Right/Left: Left Pain - part of body: Knee     Time: 1003-1051 PT Time Calculation (min) (ACUTE ONLY): 48 min  Charges:  $Gait Training: 8-22 mins $Therapeutic Exercise: 8-22 mins $Therapeutic Activity: 8-22 mins  Larae Grooms, PTA 08/09/2018, 11:13 AM

## 2018-08-09 NOTE — Care Management (Signed)
Met with patient she is explaining she has 4 stairs to get into her home and needs assistance getting up the stairs, she is stating that she will need an ambulance to get into her home, I explained to her that her insurance may not cover the ambulance as she is not bed bound and can transport in a regular vehicle,  I asked if she has a friend that can help her get inside.  She stated that her friend may or may not be available and for safety she needs and ambulance she stated that she understands that insurance will most likely not pay for it but she wants it anyway and will be responsible for the cost.

## 2018-08-09 NOTE — Discharge Planning (Addendum)
Patient IV removed.  RN assessment and VS revealed stability for DC to home with [facility].  BSC  (3:1) delivered to room before discharging.  Discharge papers given, explained and educated. Informed of suggested FU appts Appt set up with Dr. Rogers Blocker and patient's PCP agreed to contact patient to set up FU appt. Multi-vit script printed and given. Pain script E-scribed to [facility].  PT and OT rounded and assessed today, prior to DC. Once ready, EMS will be called to transport home. Waiting on arrival.

## 2018-08-09 NOTE — Progress Notes (Signed)
Subjective:    Alert and oriented.  Less pain today.  X-rays left knee negative yesterday except for effusion.  Scheduled to go to skilled nursing today.  Patient reports pain as mild.  Objective:   VITALS:   Vitals:   08/09/18 0000 08/09/18 0743  BP: 131/63 (!) 124/59  Pulse: 75 70  Resp:  18  Temp:  98.7 F (37.1 C)  SpO2:  96%    Neurologically intact ABD soft Neurovascular intact Sensation intact distally Intact pulses distally Dorsiflexion/Plantar flexion intact  LABS Recent Labs    08/06/18 1238  HGB 14.2  HCT 41.1  WBC 9.0  PLT 208    Recent Labs    08/06/18 1238 08/07/18 0354  NA 128* 138  K 3.8 3.8  BUN 15 20  CREATININE 0.92 1.18*  GLUCOSE 100* 98    No results for input(s): LABPT, INR in the last 72 hours.   Assessment/Plan:    Up with therapy Discharge to SNF   Enteric-coated aspirin 1 p.o. twice daily Follow-up with Dr. Marry Guan in 10 days.

## 2018-08-09 NOTE — Care Management (Signed)
Met with patient and friend in the room at patient's request.  I explained that the patient is set up with [facility].  The patient confirmed that she has had a long history with [facility].  I explained that the patient will be called by [facility] to set up a time to come to the home to complete assessment.  I explained that they will not come the day of discharge.  The friend asked if they could call her to let her know when they plan to come.  I explained that the patient would have to request for them to do that as she is alert and oriented and in the good frame of mind.  The friend stated that she used to be a [employer] scientist.  I explained that due to the patient being alert and oriented she can make her own decisions and ask that the home health agency call someone else if she chooses to.  The friend asked if I would set up a shower chair.  I explained that the patient was brought a 3 in 1 shower chair into the room that would go home with her.  I explained the 3 in one acts as a BSC and a toilet seat over the toilet and a shower chair.  She said she guessed it would be ok.   I asked if there were any other questions, the patient and the friend both said they thought that may be all.

## 2018-08-09 NOTE — Discharge Summary (Signed)
[place] [facility] [place] NAME: [name]    MR#:  [id number]  DATE OF BIRTH:  Mar 31, 1956  DATE OF ADMISSION:  08/06/2018 ADMITTING PHYSICIAN: Gorden Harms, MD  DATE OF DISCHARGE: 08/09/2018  PRIMARY CARE PHYSICIAN: Dion Body, MD    ADMISSION DIAGNOSIS:  Hyponatremia [E87.1] Dislocation of left hip, initial encounter ([place]) [S73.005A] Fall, initial encounter [W19.XXXA]  DISCHARGE DIAGNOSIS:  left hip dislocation recurrent status post reduction left knee swelling-- no acute fracture noted on x-ray SECONDARY DIAGNOSIS:   Past Medical History:  Diagnosis Date  . Anxiety   . Anxiety    panic anxiety disorder  . Arthritis   . Asthma    chronic asthmatic bronchitis  . Bipolar disorder ([place])   . Cancer [facility])    Desmoid tumor left forearm  . Depression   . ETOH abuse   . Heart murmur   . Insomnia   . Neuropathy     HOSPITAL COURSE:   [name] 63 y.o.femalewith a known history ofa bilateral hip replacements as well as hip dislocation, sustained a fall after patient bent over after some acute back pain, resulted in acute leg pain/inability to stand/ambulate, brought to the emergency room for further evaluation/care, in the emergency room patient was found to have acute dislocated hip prosthesis .  *Acute left dislocated hip prosthesis Noted history of dislocation in the past, status post reduction in the emergency room Prn pain protocol, orthopedic surgery input by Dr Sabra Heck appreciated. -PT/OT evaluation noted.-- Patient prefers to go home with home health PT  *Left knee swelling status post fall. X-ray left knee complete no acute fracture noted. Some effusion noted locally. X-ray was reviewed by Dr. Sabra Heck. -Patient will follow-up with Dr. Marry Guan in 10 days and further evaluation of left hip and left knee per outpatient follow-up.  *Acute hyponatremia/hypochloremia--improved Repleted with IV fluids  rehydration -tolerating po  *History of alcohol abuse Alcohol withdrawal protocol while in house no symptoms of withdrawal. I will DC Ativan. Will continue BID Klonopin PRN  *History of asthma without exacerbation Stable Breathing treatments PRN  *Chronic bipolar illness, anxiety, panic disorder Stable Continue home psychotropic regiment   Social worker for discharge planning input noted. Patient wants to go home with home health PT and aid. She has friends who can help her out according to her.  Patient is stable from orthopedic and medical standpoint for discharge.  Discharge home today. Patient informed. CONSULTS OBTAINED:  Treatment Team:  Earnestine Leys, MD  DRUG ALLERGIES:   Allergies  Allergen Reactions  . Benadryl [Diphenhydramine] Hives and Other (See Comments)    Reaction:  Hyperactivity   . Benadryl [Diphenhydramine] Other (See Comments)    My jaw locked and I could not speak, like a stroke.  . Cephalosporins Hives  . Codeine Nausea And Vomiting  . Codeine Nausea Only    severe    DISCHARGE MEDICATIONS:   Allergies as of 08/09/2018      Reactions   Benadryl [diphenhydramine] Hives, Other (See Comments)   Reaction:  Hyperactivity    Benadryl [diphenhydramine] Other (See Comments)   My jaw locked and I could not speak, like a stroke.   Cephalosporins Hives   Codeine Nausea And Vomiting   Codeine Nausea Only   severe      Medication List    TAKE these medications   carbidopa-levodopa 25-100 MG tablet Commonly known as:  SINEMET IR Take 1 tablet by mouth 3 (three) times daily.  clonazePAM 0.5 MG tablet Commonly known as:  KLONOPIN Take 0.5 mg by mouth 2 (two) times daily as needed for anxiety.   famotidine 20 MG tablet Commonly known as:  PEPCID Take 20 mg by mouth 2 (two) times daily.   gabapentin 300 MG capsule Commonly known as:  NEURONTIN Take 3 capsules (900 mg total) by mouth 3 (three) times daily.   HYDROcodone-acetaminophen  5-325 MG tablet Commonly known as:  NORCO/VICODIN Take 1-2 tablets by mouth every 4 (four) hours as needed for moderate pain.   multivitamin with minerals Tabs tablet Take 1 tablet by mouth daily.   propranolol 10 MG tablet Commonly known as:  INDERAL Take 1 tablet (10 mg total) by mouth 3 (three) times daily.   traZODone 150 MG tablet Commonly known as:  DESYREL Take 150 mg by mouth at bedtime.   venlafaxine XR 150 MG 24 hr capsule Commonly known as:  EFFEXOR-XR Take 1 capsule (150 mg total) by mouth daily with breakfast.       If you experience worsening of your admission symptoms, develop shortness of breath, life threatening emergency, suicidal or homicidal thoughts you must seek medical attention immediately by calling 911 or calling your MD immediately  if symptoms less severe.  You Must read complete instructions/literature along with all the possible adverse reactions/side effects for all the Medicines you take and that have been prescribed to you. Take any new Medicines after you have completely understood and accept all the possible adverse reactions/side effects.   Please note  You were cared for by a hospitalist during your hospital stay. If you have any questions about your discharge medications or the care you received while you were in the hospital after you are discharged, you can call the unit and asked to speak with the hospitalist on call if the hospitalist that took care of you is not available. Once you are discharged, your primary care physician will handle any further medical issues. Please note that NO REFILLS for any discharge medications will be authorized once you are discharged, as it is imperative that you return to your primary care physician (or establish a relationship with a primary care physician if you do not have one) for your aftercare needs so that they can reassess your need for medications and monitor your lab values. Today   SUBJECTIVE   Some  Left knee pain. I am just waking up  VITAL SIGNS:  Blood pressure 131/63, pulse 75, temperature 99.4 F (37.4 C), temperature source Oral, resp. rate 18, height 5\' 5"  (1.651 m), weight 62.6 kg, SpO2 93 %.  I/O:   No intake or output data in the 24 hours ending 08/09/18 0734  PHYSICAL EXAMINATION:  GENERAL:  63 y.o.-year-old patient lying in the bed with no acute distress.  EYES: Pupils equal, round, reactive to light and accommodation. No scleral icterus. Extraocular muscles intact.  HEENT: Head atraumatic, normocephalic. Oropharynx and nasopharynx clear.  NECK:  Supple, no jugular venous distention. No thyroid enlargement, no tenderness.  LUNGS: Normal breath sounds bilaterally, no wheezing, rales,rhonchi or crepitation. No use of accessory muscles of respiration.  CARDIOVASCULAR: S1, S2 normal. No murmurs, rubs, or gallops.  ABDOMEN: Soft, non-tender, non-distended. Bowel sounds present. No organomegaly or mass.  EXTREMITIES: No pedal edema, cyanosis, or clubbing. Left leg brace+ (green) NEUROLOGIC: Cranial nerves II through XII are intact. Muscle strength 5/5 in all extremities. Sensation intact. Gait not checked.  PSYCHIATRIC: The patient is alert and oriented x 3.  SKIN: No  obvious rash, lesion, or ulcer.   DATA REVIEW:   CBC  Recent Labs  Lab 08/06/18 1238  WBC 9.0  HGB 14.2  HCT 41.1  PLT 208    Chemistries  Recent Labs  Lab 08/06/18 1238 08/07/18 0354  NA 128* 138  K 3.8 3.8  CL 95* 110  CO2 23 24  GLUCOSE 100* 98  BUN 15 20  CREATININE 0.92 1.18*  CALCIUM 8.8* 8.0*  AST 16  --   ALT 6  --   ALKPHOS 64  --   BILITOT 0.7  --     Microbiology Results   No results found for this or any previous visit (from the past 240 hour(s)).  RADIOLOGY:  Dg Knee Complete 4 Views Left  Result Date: 08/08/2018 CLINICAL DATA:  Acute onset of LEFT knee pain that began yesterday after the patient's dislocated LEFT hip prosthesis was reduced. EXAM: LEFT KNEE - COMPLETE  4+ VIEW COMPARISON:  None. FINDINGS: Osseous demineralization. No visible fracture, though there is a fat fluid level in the suprapatellar bursa. Moderate MEDIAL and LATERAL compartment joint space narrowing. Patellofemoral joint space well-preserved. IMPRESSION: 1. No visible acute fractures. However, a fat fluid level is present in the joint space in the suprapatellar bursa which can be present with an occult fracture. MRI of the LEFT knee with contrast may be helpful in further evaluation to exclude an occult fracture. 2. Moderate MEDIAL and LATERAL compartment osteoarthritis. 3. Osseous demineralization. Electronically Signed   By: Evangeline Dakin M.D.   On: 08/08/2018 10:00     CODE STATUS:     Code Status Orders  (From admission, onward)         Start     Ordered   08/06/18 1931  Full code  Continuous     08/06/18 1930        Code Status History    Date Active Date Inactive Code Status Order ID Comments User Context   05/11/2017 1604 05/21/2017 1521 Full Code [id number]  Gonzella Lex, MD Inpatient   05/11/2017 1604 05/11/2017 1604 Full Code [id number]  Gonzella Lex, MD Inpatient   05/08/2017 1615 05/11/2017 1532 Full Code [id number]  Loletha Grayer, MD ED   07/05/2016 0438 07/11/2016 2023 Full Code [id number]  Clapacs, [name], MD Inpatient   07/04/2015 1503 07/09/2015 1915 Full Code [id number]  Hildred Priest, MD Inpatient   07/03/2015 2358 07/04/2015 1503 Full Code [id number]  Clapacs, [name], MD Inpatient   06/30/2015 2018 07/03/2015 2358 Full Code [id number]  Loletha Grayer, MD ED   01/25/2015 1426 01/30/2015 2050 Full Code [id number]  Clapacs, [name], MD Inpatient   01/08/2015 1244 01/10/2015 1545 Full Code [id number]  Henreitta Leber, MD Inpatient   12/21/2013 1716 12/27/2013 1140 Full Code [id number]  Clarene Reamer, MD Inpatient   12/21/2013 1013 12/21/2013 1716 Full Code [id number]  Virgel Manifold, MD ED      TOTAL TIME TAKING CARE OF THIS PATIENT: *40* minutes.    Fritzi Mandes M.D on 08/09/2018 at 7:34 AM  Between 7am to 6pm - Pager - [phone] After 6pm go to [email or web address] - password [email or web address] [place] [facility]  Office  [phone]  CC: Primary care physician; Dion Body, MD

## 2018-08-09 NOTE — Progress Notes (Signed)
Occupational Therapy Treatment Patient Details Name: [name] MRN: [id number] DOB: 14-Apr-1956 Today's Date: 08/09/2018    History of present illness Patient is a 63 year old female who presented for L hip pain secondary to falling after bending over from back pain. Resulted in L hip dislocation which was reduced while in the ER.  PMH includes cancer, insomnia, neuropathy, L arm amputation above elbow, lumbar laminectomy, Bil THA.    OT comments  Pt seen for OT treatment on this date. Upon arrival to room pt awake and requesting to use the [name]. Pt seated upright in room recliner with a friend arriving at start of OT session. Pt A&O x 4 reporting 6/10 pain. Pt agreeable to tx. Pt offered choice of ambulating to [name] placed over room toilet or having BSC brought closer. Pt chose to ambulate using HW to [facility] over room toilet. Pt able to complete functional mobility/toilet transfer with CGA and VC for safety. Pt required min assist for clothing mgmt during toileting. Pt endorsed feeling more confident in her functional mobility and stated that she feels better about going home. Pt stated she still has some concerns about getting up the steps into her home. OT provided active listening and encouraged pt to utilize strategies discussed with PT for safe ambulation. Pt making good progress toward goals and continues to benefit from skilled OT services to maximize return to PLOF and minimize risk of future falls, injury, caregiver burden, and readmission. Will continue to follow POC. Discharge recommendation for STR remains appropriate, however pt endorses desire to go home upon hospital DC.    Follow Up Recommendations  SNF    Equipment Recommendations  3 in 1 bedside commode    Recommendations for Other Services      Precautions / Restrictions Precautions Precautions: Fall Precaution Comments: Patient has single UE (LUE amputation), utilizes cane at home., history of dislocation of L hip replacement  previously Required Braces or Orthoses: Knee Immobilizer - Left Knee Immobilizer - Left: On at all times Restrictions Weight Bearing Restrictions: Yes LLE Weight Bearing: Weight bearing as tolerated       Mobility Bed Mobility Overal bed mobility: Modified Independent Bed Mobility: Supine to Sit     Supine to sit: Modified independent (Device/Increase time);HOB elevated     General bed mobility comments: Demonstrates good ability to SLR LLE and mobilize out of bed to the R. Use of rail to self assist trunk  Transfers Overall transfer level: Needs assistance Equipment used: Hemi-walker Transfers: Sit to/from Stand Sit to Stand: Min assist;+2 physical assistance         General transfer comment: mild difficulty to initiate stand due to LLE immobilizer (as well as other co morbidities/general weaknesses). Pt stands with extreme fwd flexion initially to rise decreasing overall balance. Mild increased time to come to full upright stance.     Balance Overall balance assessment: Needs assistance Sitting-balance support: No upper extremity supported Sitting balance-Leahy Scale: Good Sitting balance - Comments: Able to sit with feet supported. steady reaching within BOS. Declines dizziness/lightheadedness.   Standing balance support: Single extremity supported Standing balance-Leahy Scale: Fair Standing balance comment: Pt able to remain standing with RUE support on hemiwalker                           ADL either performed or assessed with clinical judgement   ADL Overall ADL's : Needs assistance/impaired  Toilet Transfer: Min guard;+2 for safety/equipment;Adhering to hip precautions;Cueing for safety Toilet Transfer Details (indicate cue type and reason): Pt able to ambulate to [facility] placed over room toilet using hemi-walker with Min guard A (+2 for safety 2/2 history of falling in the hospital).  Toileting- Clothing Manipulation and  Hygiene: Minimal assistance Toileting - Clothing Manipulation Details (indicate cue type and reason): Pr required min a for mgmt of hospital gowns.        General ADL Comments: Pt endorses feeling more confident in ADLs after success with OT/PT on this date.      Vision Baseline Vision/History: No visual deficits Patient Visual Report: No change from baseline     Perception     Praxis      Cognition Arousal/Alertness: Awake/alert Behavior During Therapy: WFL for tasks assessed/performed Overall Cognitive Status: Within Functional Limits for tasks assessed                                 General Comments: Patient responds to simple and complex requests.         Exercises Exercises: General Lower Extremity General Exercises - Lower Extremity Ankle Circles/Pumps: AROM;Both;20 reps Quad Sets: Strengthening;Both;10 reps;Standing;Other (comment)(with weight shift; also 10 in long sit) Gluteal Sets: Strengthening;Both;20 reps Straight Leg Raises: Strengthening;Left;10 reps;Supine;Seated(10x each position) Other Exercises Other Exercises: educated on household management regarding use of BSC, short household ambulation distances Neurosurgeon). Daily HEP (pt very familiar with exercises due to much rehab in the past0 Other Exercises: pt instructed in AE/DME to minimize back pain and hip pain and maximize independence and safety with ADL tasks Other Exercises: Assisted pt with ambulation using hemi-walker to room bathroom and with toileting ADL task.    Shoulder Instructions       General Comments      Pertinent Vitals/ Pain       Pain Assessment: 0-10 Pain Score: 6  Pain Location: L knee Pain Descriptors / Indicators: Aching;Constant;Guarding Pain Intervention(s): Limited activity within patient's tolerance;Monitored during session;Repositioned  Home Living                                          Prior Functioning/Environment               Frequency  Min 2X/week        Progress Toward Goals  OT Goals(current goals can now be found in the care plan section)  Progress towards OT goals: Progressing toward goals  Acute Rehab OT Goals Patient Stated Goal: be able to walk, take care of my cats, and have less pain OT Goal Formulation: With patient Time For Goal Achievement: 08/21/18 Potential to Achieve Goals: Good  Plan Discharge plan remains appropriate    Co-evaluation                 AM-PAC OT "6 Clicks" Daily Activity     Outcome Measure   Help from another person eating meals?: None Help from another person taking care of personal grooming?: None Help from another person toileting, which includes using toliet, bedpan, or urinal?: A Lot Help from another person bathing (including washing, rinsing, drying)?: A Lot Help from another person to put on and taking off regular upper body clothing?: A Little Help from another person to put on and taking off regular lower body clothing?: A  Lot 6 Click Score: 17    End of Session Equipment Utilized During Treatment: Gait belt;Other (comment)(hemi-walker, knee immobilizer)  OT Visit Diagnosis: Other abnormalities of gait and mobility (R26.89);Pain;Muscle weakness (generalized) (M62.81) Pain - Right/Left: Left Pain - part of body: Leg   Activity Tolerance Patient tolerated treatment well   Patient Left with call bell/phone within reach;in chair;with chair alarm set;with family/visitor present;Other (comment)(With KI in place with ice pack. )   Nurse Communication Mobility status        Time: 0289-0228 OT Time Calculation (min): 24 min  Charges: OT General Charges $OT Visit: 1 Visit OT Treatments $Self Care/Home Management : 23-37 mins  ,Shara Blazing, M.S., OTR/L Ascom: [phone] 08/09/18, 2:04 PM

## 2018-08-09 NOTE — Care Management (Signed)
Notified Corene Cornea with [facility] that the patient will need a 3 in 1 and is discharging home today.

## 2018-08-18 ENCOUNTER — Other Ambulatory Visit: Payer: Self-pay | Admitting: Physician Assistant

## 2018-08-18 DIAGNOSIS — M25552 Pain in left hip: Secondary | ICD-10-CM

## 2018-08-18 DIAGNOSIS — M2392 Unspecified internal derangement of left knee: Secondary | ICD-10-CM

## 2018-08-22 ENCOUNTER — Other Ambulatory Visit: Payer: Self-pay

## 2018-08-22 ENCOUNTER — Ambulatory Visit
Admission: RE | Admit: 2018-08-22 | Discharge: 2018-08-22 | Disposition: A | Discharge status: Home / Self Care | Payer: Medicare Other | Source: Ambulatory Visit | Attending: Physician Assistant | Admitting: Physician Assistant

## 2018-08-22 DIAGNOSIS — M25552 Pain in left hip: Secondary | ICD-10-CM | POA: Insufficient documentation

## 2018-08-22 DIAGNOSIS — M2392 Unspecified internal derangement of left knee: Secondary | ICD-10-CM | POA: Insufficient documentation

## 2018-08-26 ENCOUNTER — Emergency Department
Admission: EM | Admit: 2018-08-26 | Discharge: 2018-08-26 | Disposition: A | Discharge status: Home / Self Care | Payer: Medicare Other | Attending: Emergency Medicine | Admitting: Emergency Medicine

## 2018-08-26 ENCOUNTER — Encounter: Payer: Self-pay | Admitting: Emergency Medicine

## 2018-08-26 ENCOUNTER — Other Ambulatory Visit: Payer: Self-pay

## 2018-08-26 DIAGNOSIS — M25562 Pain in left knee: Secondary | ICD-10-CM | POA: Diagnosis not present

## 2018-08-26 DIAGNOSIS — J45909 Unspecified asthma, uncomplicated: Secondary | ICD-10-CM | POA: Diagnosis not present

## 2018-08-26 DIAGNOSIS — Z79899 Other long term (current) drug therapy: Secondary | ICD-10-CM | POA: Insufficient documentation

## 2018-08-26 DIAGNOSIS — F17210 Nicotine dependence, cigarettes, uncomplicated: Secondary | ICD-10-CM | POA: Insufficient documentation

## 2018-08-26 DIAGNOSIS — Z96643 Presence of artificial hip joint, bilateral: Secondary | ICD-10-CM | POA: Insufficient documentation

## 2018-08-26 DIAGNOSIS — Z85831 Personal history of malignant neoplasm of soft tissue: Secondary | ICD-10-CM | POA: Diagnosis not present

## 2018-08-26 MED ORDER — HYDROCODONE-ACETAMINOPHEN 5-325 MG PO TABS: 1.0000 | ORAL_TABLET | Freq: Four times a day (QID) | ORAL | 0 refills | Status: AC | PRN

## 2018-08-26 MED ORDER — KETOROLAC TROMETHAMINE 30 MG/ML IJ SOLN
30.0000 mg | Freq: Once | INTRAMUSCULAR | Status: AC
  Administered 2018-08-26: 30 mg via INTRAVENOUS

## 2018-08-26 MED ORDER — KETOROLAC TROMETHAMINE 30 MG/ML IJ SOLN: 30.0000 mg | Freq: Once | INTRAMUSCULAR | Status: DC

## 2018-08-26 MED ORDER — HYDROCODONE-ACETAMINOPHEN 5-325 MG PO TABS: 1.0000 | ORAL_TABLET | Freq: Four times a day (QID) | ORAL | 0 refills | Status: DC | PRN

## 2018-08-26 MED ORDER — KETOROLAC TROMETHAMINE 30 MG/ML IJ SOLN
INTRAMUSCULAR | Status: AC
  Administered 2018-08-26: 30 mg via INTRAVENOUS
  Filled 2018-08-26: qty 1

## 2018-08-26 NOTE — ED Notes (Signed)

## 2018-08-26 NOTE — ED Notes (Signed)
Spoke with patient and a church member that is concerned and wants [place] to admit this pt to rehab for her knee and hip pain. Pt has been admitted with the original injury for obs. Pt has home health set up to start next week and currently has someone that was in with her this am that called the dr's office concerned the pt was taking too much pain meds. Pt is having a hard time concentrating, rolling her head on the bed and speaking with slurred voice. Spoke with case manager and she will come over to speak with pt regarding her options.

## 2018-08-26 NOTE — ED Notes (Signed)
EDP and case manager RN speaking to pt and pt's visitor at this time.

## 2018-08-26 NOTE — ED Triage Notes (Signed)
Pt arrives with complaints of left knee pain. Pt states she had an MRI on Sunday and a fracture was dx. Pt denies being prescribed pain medication. Pt states the orthopedic instructed her to bear no weight on her left knee. Pt reports she is here today for pain medication because she cannot control her pain at home.

## 2018-08-26 NOTE — ED Provider Notes (Signed)
[facility] Emergency Department Provider Note  ____________________________________________  Time seen: Approximately 11:56 PM  I have reviewed the triage vital signs and the nursing notes.   HISTORY  Chief Complaint Knee Pain    HPI [name] is a 63 y.o. female presents to the emergency department with 10 out of 10 acute left knee pain.  Patient experienced a recent fall and underwent a MRI of the left knee on 08/22/2018.  Patient was diagnosed with a tibial plateau fracture.  Patient was advised to use anti-inflammatories at home and patient reports that she has been unable to manage her pain.  Patient has a history of anxiety and Parkinson's disease.  Patient reports that she has been overusing her Klonopin due to the pain.  Patient is accompanied by a family friend who would like patient admitted.  Patient denies suicidal or homicidal ideation.  No other alleviating measures have been attempted.   Past Medical History:  Diagnosis Date  . Anxiety   . Anxiety    panic anxiety disorder  . Arthritis   . Asthma    chronic asthmatic bronchitis  . Bipolar disorder ([place])   . Cancer [facility])    Desmoid tumor left forearm  . Depression   . ETOH abuse   . Heart murmur   . Insomnia   . Neuropathy     Patient Active Problem List   Diagnosis Date Noted  . Hip dislocation, left, sequela 08/06/2018  . High anion gap metabolic acidosis 97/98/9211  . Overdose of antidepressant 07/03/2016  . S/P total hip arthroplasty 09/17/2015  . Bipolar I disorder, most recent episode mixed, severe with psychotic features ([place]) 07/03/2015  . Alcohol use disorder, severe, dependence ([place]) 01/26/2015  . Tobacco use disorder 01/26/2015  . Alcohol withdrawal ([place]) 01/08/2015  . 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 GAD (generalized anxiety disorder) 12/24/2013  . PTSD (post-traumatic stress disorder) 12/22/2013    Past Surgical History:   Procedure Laterality Date  . ABDOMINAL HYSTERECTOMY    . ABDOMINAL HYSTERECTOMY  2007  . 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 ARM AMPUTATION AT SHOULDER Left   . CESAREAN SECTION    . [place]   x 2  . ESOPHAGOGASTRODUODENOSCOPY (EGD) WITH PROPOFOL N/A 04/27/2018   Procedure: ESOPHAGOGASTRODUODENOSCOPY (EGD) WITH PROPOFOL;  Surgeon: Toledo, Benay Pike, MD;  Location: [facility] ENDOSCOPY;  Service: Gastroenterology;  Laterality: N/A;  . JOINT REPLACEMENT Right 2012   hip  . LUMBAR LAMINECTOMY/DECOMPRESSION MICRODISCECTOMY Left 04/16/2016   Procedure: LUMBAR LAMINECTOMY/DECOMPRESSION MICRODISCECTOMY 1 LEVEL;  Surgeon: Meade Maw, MD;  Location: [facility] ORS;  Service: Neurosurgery;  Laterality: Left;  Left L4-5 far lateral discectomy, left L4-5 laminoforaminotomy  . TOTAL HIP ARTHROPLASTY Left 09/17/2015   Procedure: TOTAL HIP ARTHROPLASTY;  Surgeon: Dereck Leep, MD;  Location: [facility] ORS;  Service: Orthopedics;  Laterality: Left;    Prior to Admission medications   Medication Sig Start Date End Date Taking? Authorizing Provider  carbidopa-levodopa (SINEMET IR) 25-100 MG tablet Take 1 tablet by mouth 3 (three) times daily.    [provider]  clonazePAM (KLONOPIN) 0.5 MG tablet Take 0.5 mg by mouth 2 (two) times daily as needed for anxiety.    [provider]  famotidine (PEPCID) 20 MG tablet Take 20 mg by mouth 2 (two) times daily.    [provider]  gabapentin (NEURONTIN) 300 MG capsule Take 3 capsules (900  mg total) by mouth 3 (three) times daily. 05/21/17   Pucilowska, Wardell Honour, MD  HYDROcodone-acetaminophen (NORCO) 5-325 MG tablet Take 1 tablet by mouth every 6 (six) hours as needed for up to 3 days. 08/26/18 08/29/18  Lannie Fields, PA-C  Multiple Vitamin (MULTIVITAMIN WITH MINERALS) TABS tablet Take 1 tablet by mouth daily. 08/09/18   Fritzi Mandes, MD  propranolol (INDERAL) 10 MG tablet Take 1 tablet (10 mg total) by  mouth 3 (three) times daily. 05/21/17   Pucilowska, Herma Ard B, MD  traZODone (DESYREL) 150 MG tablet Take 150 mg by mouth at bedtime.     [provider]  venlafaxine XR (EFFEXOR-XR) 150 MG 24 hr capsule Take 1 capsule (150 mg total) by mouth daily with breakfast. 05/21/17   Pucilowska, [name], MD    Allergies Benadryl [diphenhydramine]; Benadryl [diphenhydramine]; Cephalosporins; Codeine; and Codeine  Family History  Problem Relation Age of Onset  . COPD Mother     Social History Social History   Tobacco Use  . Smoking status: Current Every Day Smoker    Packs/day: 0.25    Types: Cigarettes  . Smokeless tobacco: Never Used  Substance Use Topics  . Alcohol use: Yes    Comment: 2 40oz beers daily  . Drug use: No    Comment: Patient denies      Review of Systems  Constitutional: No fever/chills Eyes: No visual changes. No discharge ENT: No upper respiratory complaints. Cardiovascular: no chest pain. Respiratory: no cough. No SOB. Gastrointestinal: No abdominal pain.  No nausea, no vomiting.  No diarrhea.  No constipation. Musculoskeletal: Patient has left knee pain. Skin: Negative for rash, abrasions, lacerations, ecchymosis. Neurological: Negative for headaches, focal weakness or numbness.   ____________________________________________   PHYSICAL EXAM:  VITAL SIGNS: ED Triage Vitals [08/26/18 1521]  Enc Vitals Group     BP (!) 154/94     Pulse Rate 92     Resp 18     Temp 98.6 F (37 C)     Temp Source Oral     SpO2 100 %     Weight 138 lb (62.6 kg)     Height 5\' 5"  (1.651 m)     Head Circumference      Peak Flow      Pain Score 10     Pain Loc      Pain Edu?      Excl. in [place]?      Constitutional: Alert and oriented. Well appearing and in no acute distress. Eyes: Conjunctivae are normal. PERRL. EOMI. Head: Atraumatic. Cardiovascular: Normal rate, regular rhythm. Normal S1 and S2.  Good peripheral circulation. Respiratory: Normal  respiratory effort without tachypnea or retractions. Lungs CTAB. Good air entry to the bases with no decreased or absent breath sounds. Musculoskeletal: Patient is unable to perform full range of motion of the left knee, likely secondary to pain.  Provocative testing is limited due to patient's left knee pain.  Palpable dorsalis pedis pulse, left. Neurologic:  Normal speech and language. No gross focal neurologic deficits are appreciated.  Skin:  Skin is warm, dry and intact. No rash noted. Psychiatric: Mood and affect are normal. Speech and behavior are normal. Patient exhibits appropriate insight and judgement.   ____________________________________________   LABS (all labs ordered are listed, but only abnormal results are displayed)  Labs Reviewed - No data to display ____________________________________________  EKG   ____________________________________________  RADIOLOGY   No results found.  ____________________________________________    PROCEDURES  Procedure(s)  performed:    Procedures    Medications  ketorolac (TORADOL) 30 MG/ML injection 30 mg (30 mg Intravenous Given 08/26/18 1704)     ____________________________________________   INITIAL IMPRESSION / ASSESSMENT AND PLAN / ED COURSE  Pertinent labs & imaging results that were available during my care of the patient were reviewed by me and considered in my medical decision making (see chart for details).  Review of the [place] CSRS was performed in accordance of the [place] prior to dispensing any controlled drugs.      Assessment and plan Acute left knee pain Patient presents to the emergency department with acute left knee pain.  Tibial plateau fracture was identified on 08/22/2018.  Patient is unwilling to surrender disability compensation in order to obtain placement in rehab facility.  Patient was offered a short course of Norco until she can see her orthopedist next week.  Patient does not currently meet  admission criteria at this time.  Social work evaluated patient in the emergency department and reiterated that assistance with placement in rehab facility could occur if patient agreed to surrender disability compensation.  Patient again declined.  All patient questions were answered.  ____________________________________________  FINAL CLINICAL IMPRESSION(S) / ED DIAGNOSES  Final diagnoses:  Acute pain of left knee      NEW MEDICATIONS STARTED DURING THIS VISIT:  ED Discharge Orders         Ordered    HYDROcodone-acetaminophen (NORCO) 5-325 MG tablet  Every 6 hours PRN,   Status:  Discontinued     08/26/18 1644    HYDROcodone-acetaminophen (NORCO) 5-325 MG tablet  Every 6 hours PRN     08/26/18 1701              This chart was dictated using voice recognition software/Dragon. Despite best efforts to proofread, errors can occur which can change the meaning. Any change was purely unintentional.    Lannie Fields, PA-C 08/27/18 0001    Schuyler Amor, MD 08/30/18 [phone]

## 2018-11-28 ENCOUNTER — Encounter: Payer: Self-pay | Admitting: Emergency Medicine

## 2018-11-28 ENCOUNTER — Emergency Department: Payer: Medicare Other

## 2018-11-28 ENCOUNTER — Observation Stay
Admission: EM | Admit: 2018-11-28 | Discharge: 2018-11-29 | Disposition: A | Discharge status: Home / Self Care | Payer: Medicare Other | Attending: Internal Medicine | Admitting: Internal Medicine

## 2018-11-28 DIAGNOSIS — F4310 Post-traumatic stress disorder, unspecified: Secondary | ICD-10-CM | POA: Diagnosis present

## 2018-11-28 DIAGNOSIS — Z96643 Presence of artificial hip joint, bilateral: Secondary | ICD-10-CM | POA: Diagnosis not present

## 2018-11-28 DIAGNOSIS — F411 Generalized anxiety disorder: Secondary | ICD-10-CM | POA: Diagnosis not present

## 2018-11-28 DIAGNOSIS — J449 Chronic obstructive pulmonary disease, unspecified: Secondary | ICD-10-CM | POA: Diagnosis not present

## 2018-11-28 DIAGNOSIS — E876 Hypokalemia: Secondary | ICD-10-CM | POA: Diagnosis present

## 2018-11-28 DIAGNOSIS — G629 Polyneuropathy, unspecified: Secondary | ICD-10-CM | POA: Insufficient documentation

## 2018-11-28 DIAGNOSIS — X509XXA Other and unspecified overexertion or strenuous movements or postures, initial encounter: Secondary | ICD-10-CM | POA: Insufficient documentation

## 2018-11-28 DIAGNOSIS — F10929 Alcohol use, unspecified with intoxication, unspecified: Secondary | ICD-10-CM

## 2018-11-28 DIAGNOSIS — M8580 Other specified disorders of bone density and structure, unspecified site: Secondary | ICD-10-CM | POA: Diagnosis not present

## 2018-11-28 DIAGNOSIS — F10229 Alcohol dependence with intoxication, unspecified: Secondary | ICD-10-CM | POA: Insufficient documentation

## 2018-11-28 DIAGNOSIS — T84021A Dislocation of internal left hip prosthesis, initial encounter: Secondary | ICD-10-CM | POA: Diagnosis not present

## 2018-11-28 DIAGNOSIS — F319 Bipolar disorder, unspecified: Secondary | ICD-10-CM | POA: Insufficient documentation

## 2018-11-28 DIAGNOSIS — Z1159 Encounter for screening for other viral diseases: Secondary | ICD-10-CM | POA: Diagnosis not present

## 2018-11-28 DIAGNOSIS — G4700 Insomnia, unspecified: Secondary | ICD-10-CM | POA: Diagnosis not present

## 2018-11-28 DIAGNOSIS — Z79899 Other long term (current) drug therapy: Secondary | ICD-10-CM | POA: Diagnosis not present

## 2018-11-28 DIAGNOSIS — F1010 Alcohol abuse, uncomplicated: Secondary | ICD-10-CM

## 2018-11-28 DIAGNOSIS — F17210 Nicotine dependence, cigarettes, uncomplicated: Secondary | ICD-10-CM | POA: Insufficient documentation

## 2018-11-28 DIAGNOSIS — E871 Hypo-osmolality and hyponatremia: Secondary | ICD-10-CM

## 2018-11-28 DIAGNOSIS — M1990 Unspecified osteoarthritis, unspecified site: Secondary | ICD-10-CM | POA: Insufficient documentation

## 2018-11-28 DIAGNOSIS — T1490XA Injury, unspecified, initial encounter: Secondary | ICD-10-CM

## 2018-11-28 DIAGNOSIS — S73005A Unspecified dislocation of left hip, initial encounter: Secondary | ICD-10-CM | POA: Diagnosis present

## 2018-11-28 MED ORDER — HYDROMORPHONE HCL 1 MG/ML IJ SOLN
1.0000 mg | Freq: Once | INTRAMUSCULAR | Status: AC
  Administered 2018-11-28: 23:00:00 1 mg via INTRAVENOUS
  Filled 2018-11-28: qty 1

## 2018-11-28 NOTE — ED Notes (Signed)
Pt yelling out from room. Shelly Schwartz, RN went to check on patient. Patients states it is taking too long and wants an advocate. Charge RN Lea at bedside. Pt given pain medicine per verbal order from EDP.

## 2018-11-28 NOTE — ED Triage Notes (Signed)
Pt to ED with c/o of left hip pain. Pt had hip surgery approx 2 years ago. Pt states this is the 3rd displacement since her surgery. Obvious deformity noted upon assessment.

## 2018-11-29 ENCOUNTER — Emergency Department: Payer: Medicare Other

## 2018-11-29 ENCOUNTER — Other Ambulatory Visit: Payer: Self-pay

## 2018-11-29 DIAGNOSIS — F4310 Post-traumatic stress disorder, unspecified: Secondary | ICD-10-CM | POA: Diagnosis not present

## 2018-11-29 DIAGNOSIS — S73005S Unspecified dislocation of left hip, sequela: Secondary | ICD-10-CM | POA: Diagnosis not present

## 2018-11-29 DIAGNOSIS — T84021A Dislocation of internal left hip prosthesis, initial encounter: Secondary | ICD-10-CM | POA: Diagnosis not present

## 2018-11-29 DIAGNOSIS — F411 Generalized anxiety disorder: Secondary | ICD-10-CM | POA: Diagnosis not present

## 2018-11-29 DIAGNOSIS — S73005A Unspecified dislocation of left hip, initial encounter: Secondary | ICD-10-CM | POA: Diagnosis present

## 2018-11-29 DIAGNOSIS — T84021S Dislocation of internal left hip prosthesis, sequela: Secondary | ICD-10-CM

## 2018-11-29 LAB — CBC WITH DIFFERENTIAL/PLATELET
Abs Immature Granulocytes: 0.23 10*3/uL — ABNORMAL HIGH (ref 0.00–0.07)
Basophils Absolute: 0.1 10*3/uL (ref 0.0–0.1)
Basophils Relative: 1 %
Eosinophils Absolute: 0.2 10*3/uL (ref 0.0–0.5)
Eosinophils Relative: 2 %
HCT: 37.8 % (ref 36.0–46.0)
Hemoglobin: 13.6 g/dL (ref 12.0–15.0)
Immature Granulocytes: 2 %
Lymphocytes Relative: 37 %
Lymphs Abs: 3.6 10*3/uL (ref 0.7–4.0)
MCH: 33.3 pg (ref 26.0–34.0)
MCHC: 36 g/dL (ref 30.0–36.0)
MCV: 92.6 fL (ref 80.0–100.0)
Monocytes Absolute: 0.8 10*3/uL (ref 0.1–1.0)
Monocytes Relative: 8 %
Neutro Abs: 4.9 10*3/uL (ref 1.7–7.7)
Neutrophils Relative %: 50 %
Platelets: 288 10*3/uL (ref 150–400)
RBC: 4.08 MIL/uL (ref 3.87–5.11)
RDW: 14.6 % (ref 11.5–15.5)
WBC: 9.7 10*3/uL (ref 4.0–10.5)
nRBC: 0 % (ref 0.0–0.2)

## 2018-11-29 LAB — URINALYSIS, ROUTINE W REFLEX MICROSCOPIC
Bacteria, UA: NONE SEEN
Bilirubin Urine: NEGATIVE
Glucose, UA: NEGATIVE mg/dL
Ketones, ur: NEGATIVE mg/dL
Leukocytes,Ua: NEGATIVE
Nitrite: NEGATIVE
Protein, ur: NEGATIVE mg/dL
Specific Gravity, Urine: 1.003 — ABNORMAL LOW (ref 1.005–1.030)
WBC, UA: NONE SEEN WBC/hpf (ref 0–5)
pH: 6 (ref 5.0–8.0)

## 2018-11-29 LAB — CBC
HCT: 36.9 % (ref 36.0–46.0)
Hemoglobin: 13 g/dL (ref 12.0–15.0)
MCH: 32.9 pg (ref 26.0–34.0)
MCHC: 35.2 g/dL (ref 30.0–36.0)
MCV: 93.4 fL (ref 80.0–100.0)
Platelets: 284 10*3/uL (ref 150–400)
RBC: 3.95 MIL/uL (ref 3.87–5.11)
RDW: 14.7 % (ref 11.5–15.5)
WBC: 9.5 10*3/uL (ref 4.0–10.5)
nRBC: 0 % (ref 0.0–0.2)

## 2018-11-29 LAB — COMPREHENSIVE METABOLIC PANEL
ALT: 7 U/L (ref 0–44)
AST: 15 U/L (ref 15–41)
Albumin: 4 g/dL (ref 3.5–5.0)
Alkaline Phosphatase: 69 U/L (ref 38–126)
Anion gap: 12 (ref 5–15)
BUN: 10 mg/dL (ref 8–23)
CO2: 25 mmol/L (ref 22–32)
Calcium: 8.3 mg/dL — ABNORMAL LOW (ref 8.9–10.3)
Chloride: 88 mmol/L — ABNORMAL LOW (ref 98–111)
Creatinine, Ser: 0.57 mg/dL (ref 0.44–1.00)
GFR calc Af Amer: >60 mL/min (ref >60)
GFR calc non Af Amer: >60 mL/min (ref >60)
Glucose, Bld: 67 mg/dL — ABNORMAL LOW (ref 70–99)
Potassium: 2.7 mmol/L — CL (ref 3.5–5.1)
Sodium: 125 mmol/L — ABNORMAL LOW (ref 135–145)
Total Bilirubin: 0.5 mg/dL (ref 0.3–1.2)
Total Protein: 7 g/dL (ref 6.5–8.1)

## 2018-11-29 LAB — BASIC METABOLIC PANEL
Anion gap: 9 (ref 5–15)
BUN: 10 mg/dL (ref 8–23)
CO2: 24 mmol/L (ref 22–32)
Calcium: 7.9 mg/dL — ABNORMAL LOW (ref 8.9–10.3)
Chloride: 98 mmol/L (ref 98–111)
Creatinine, Ser: 0.54 mg/dL (ref 0.44–1.00)
GFR calc Af Amer: >60 mL/min (ref >60)
GFR calc non Af Amer: >60 mL/min (ref >60)
Glucose, Bld: 84 mg/dL (ref 70–99)
Potassium: 3 mmol/L — ABNORMAL LOW (ref 3.5–5.1)
Sodium: 131 mmol/L — ABNORMAL LOW (ref 135–145)

## 2018-11-29 LAB — URINE DRUG SCREEN, QUALITATIVE (ARMC ONLY)
Amphetamines, Ur Screen: NOT DETECTED
Barbiturates, Ur Screen: NOT DETECTED
Benzodiazepine, Ur Scrn: NOT DETECTED
Cannabinoid 50 Ng, Ur ~~LOC~~: NOT DETECTED
Cocaine Metabolite,Ur ~~LOC~~: NOT DETECTED
MDMA (Ecstasy)Ur Screen: NOT DETECTED
Methadone Scn, Ur: NOT DETECTED
Opiate, Ur Screen: NOT DETECTED
Phencyclidine (PCP) Ur S: NOT DETECTED
Tricyclic, Ur Screen: NOT DETECTED

## 2018-11-29 LAB — GLUCOSE, CAPILLARY
Glucose-Capillary: 104 mg/dL — ABNORMAL HIGH (ref 70–99)
Glucose-Capillary: 92 mg/dL (ref 70–99)
Glucose-Capillary: 82 mg/dL (ref 70–99)

## 2018-11-29 LAB — PROTIME-INR
INR: 1 (ref 0.8–1.2)
Prothrombin Time: 12.6 seconds (ref 11.4–15.2)

## 2018-11-29 LAB — MAGNESIUM: Magnesium: 2 mg/dL (ref 1.7–2.4)

## 2018-11-29 LAB — ACETAMINOPHEN LEVEL: Acetaminophen (Tylenol), Serum: <10 ug/mL — ABNORMAL LOW (ref 10–30)

## 2018-11-29 LAB — ETHANOL: Alcohol, Ethyl (B): 130 mg/dL — ABNORMAL HIGH (ref <10)

## 2018-11-29 LAB — SALICYLATE LEVEL: Salicylate Lvl: <7 mg/dL (ref 2.8–30.0)

## 2018-11-29 LAB — SARS CORONAVIRUS 2 BY RT PCR (HOSPITAL ORDER, PERFORMED IN ~~LOC~~ HOSPITAL LAB): SARS Coronavirus 2: NEGATIVE

## 2018-11-29 MED ORDER — VENLAFAXINE HCL ER 150 MG PO CP24
300.0000 mg | ORAL_CAPSULE | Freq: Every day | ORAL | Status: DC
  Filled 2018-11-29: qty 2

## 2018-11-29 MED ORDER — SODIUM CHLORIDE 0.9% FLUSH: 3.0000 mL | Freq: Two times a day (BID) | INTRAVENOUS | Status: DC

## 2018-11-29 MED ORDER — ONDANSETRON HCL 4 MG PO TABS: 4.0000 mg | ORAL_TABLET | Freq: Four times a day (QID) | ORAL | Status: DC | PRN

## 2018-11-29 MED ORDER — FOLIC ACID 1 MG PO TABS
1.0000 mg | ORAL_TABLET | Freq: Every day | ORAL | Status: DC
  Administered 2018-11-29: 1 mg via ORAL
  Filled 2018-11-29: qty 1

## 2018-11-29 MED ORDER — ADULT MULTIVITAMIN W/MINERALS CH: 1.0000 | ORAL_TABLET | Freq: Every day | ORAL | Status: DC

## 2018-11-29 MED ORDER — POTASSIUM CHLORIDE CRYS ER 20 MEQ PO TBCR
40.0000 meq | EXTENDED_RELEASE_TABLET | Freq: Once | ORAL | Status: AC
  Administered 2018-11-29: 40 meq via ORAL
  Filled 2018-11-29: qty 2

## 2018-11-29 MED ORDER — POTASSIUM CHLORIDE 10 MEQ/100ML IV SOLN
10.0000 meq | INTRAVENOUS | Status: DC
  Administered 2018-11-29: 07:00:00 10 meq via INTRAVENOUS
  Filled 2018-11-29 (×6): qty 100

## 2018-11-29 MED ORDER — POLYETHYLENE GLYCOL 3350 17 G PO PACK: 17.0000 g | PACK | Freq: Every day | ORAL | Status: DC | PRN

## 2018-11-29 MED ORDER — CALCIUM CARBONATE 1250 (500 CA) MG PO TABS: 1.0000 | ORAL_TABLET | Freq: Every day | ORAL | 0 refills | Status: DC

## 2018-11-29 MED ORDER — TRAMADOL HCL 50 MG PO TABS: 50.0000 mg | ORAL_TABLET | Freq: Four times a day (QID) | ORAL | 0 refills | Status: DC | PRN

## 2018-11-29 MED ORDER — SODIUM CHLORIDE 0.9% FLUSH: 10.0000 mL | INTRAVENOUS | Status: DC | PRN

## 2018-11-29 MED ORDER — ACETAMINOPHEN 325 MG PO TABS
650.0000 mg | ORAL_TABLET | Freq: Four times a day (QID) | ORAL | Status: DC | PRN
  Administered 2018-11-29: 650 mg via ORAL
  Filled 2018-11-29: qty 2

## 2018-11-29 MED ORDER — THIAMINE HCL 100 MG/ML IJ SOLN: 100.0000 mg | Freq: Every day | INTRAMUSCULAR | Status: DC

## 2018-11-29 MED ORDER — PROPRANOLOL HCL 20 MG PO TABS: 10.0000 mg | ORAL_TABLET | Freq: Three times a day (TID) | ORAL | Status: DC

## 2018-11-29 MED ORDER — ONDANSETRON HCL 4 MG/2ML IJ SOLN
4.0000 mg | INTRAMUSCULAR | Status: AC
  Administered 2018-11-29: 01:00:00 4 mg via INTRAVENOUS
  Filled 2018-11-29: qty 2

## 2018-11-29 MED ORDER — SODIUM CHLORIDE 0.9 % IV SOLN: 250.0000 mL | INTRAVENOUS | Status: DC | PRN

## 2018-11-29 MED ORDER — TRAZODONE HCL 50 MG PO TABS: 150.0000 mg | ORAL_TABLET | Freq: Every day | ORAL | Status: DC

## 2018-11-29 MED ORDER — POTASSIUM CHLORIDE CRYS ER 20 MEQ PO TBCR: 40.0000 meq | EXTENDED_RELEASE_TABLET | Freq: Once | ORAL | Status: DC

## 2018-11-29 MED ORDER — ONDANSETRON HCL 4 MG/2ML IJ SOLN: 4.0000 mg | Freq: Four times a day (QID) | INTRAMUSCULAR | Status: DC | PRN

## 2018-11-29 MED ORDER — GABAPENTIN 300 MG PO CAPS
900.0000 mg | ORAL_CAPSULE | Freq: Three times a day (TID) | ORAL | Status: DC
  Administered 2018-11-29: 14:00:00 900 mg via ORAL
  Filled 2018-11-29: qty 3

## 2018-11-29 MED ORDER — CLONAZEPAM 0.5 MG PO TABS: 0.5000 mg | ORAL_TABLET | Freq: Two times a day (BID) | ORAL | 0 refills | Status: DC | PRN

## 2018-11-29 MED ORDER — THIAMINE HCL 100 MG/ML IJ SOLN
100.0000 mg | Freq: Once | INTRAMUSCULAR | Status: AC
  Administered 2018-11-29: 02:00:00 100 mg via INTRAVENOUS
  Filled 2018-11-29: qty 2

## 2018-11-29 MED ORDER — SODIUM CHLORIDE 0.9% FLUSH: 3.0000 mL | INTRAVENOUS | Status: DC | PRN

## 2018-11-29 MED ORDER — CARBIDOPA-LEVODOPA 25-100 MG PO TABS
1.0000 | ORAL_TABLET | Freq: Three times a day (TID) | ORAL | Status: DC
  Administered 2018-11-29: 1 via ORAL
  Filled 2018-11-29 (×3): qty 1

## 2018-11-29 MED ORDER — LORAZEPAM 2 MG/ML IJ SOLN: 1.0000 mg | Freq: Four times a day (QID) | INTRAMUSCULAR | Status: DC | PRN

## 2018-11-29 MED ORDER — VENLAFAXINE HCL ER 150 MG PO CP24: 300.0000 mg | ORAL_CAPSULE | Freq: Every day | ORAL | 0 refills | Status: DC

## 2018-11-29 MED ORDER — SODIUM CHLORIDE 0.9 % IV BOLUS
500.0000 mL | Freq: Once | INTRAVENOUS | Status: AC
  Administered 2018-11-29: 01:00:00 500 mL via INTRAVENOUS

## 2018-11-29 MED ORDER — FOLIC ACID 1 MG PO TABS: 1.0000 mg | ORAL_TABLET | Freq: Every day | ORAL | Status: DC

## 2018-11-29 MED ORDER — SODIUM CHLORIDE 0.9 % IV SOLN
INTRAVENOUS | Status: DC
  Administered 2018-11-29: 07:00:00 via INTRAVENOUS

## 2018-11-29 MED ORDER — ADULT MULTIVITAMIN W/MINERALS CH
1.0000 | ORAL_TABLET | Freq: Every day | ORAL | Status: DC
  Filled 2018-11-29: qty 1

## 2018-11-29 MED ORDER — CALCIUM CARBONATE ANTACID 500 MG PO CHEW: 400.0000 mg | CHEWABLE_TABLET | Freq: Once | ORAL | Status: DC

## 2018-11-29 MED ORDER — FAMOTIDINE 20 MG PO TABS: 20.0000 mg | ORAL_TABLET | Freq: Two times a day (BID) | ORAL | Status: DC

## 2018-11-29 MED ORDER — VITAMIN B-1 100 MG PO TABS
100.0000 mg | ORAL_TABLET | Freq: Every day | ORAL | Status: DC
  Filled 2018-11-29: qty 1

## 2018-11-29 MED ORDER — ACETAMINOPHEN 650 MG RE SUPP: 650.0000 mg | Freq: Four times a day (QID) | RECTAL | Status: DC | PRN

## 2018-11-29 MED ORDER — VITAMIN B-1 100 MG PO TABS: 100.0000 mg | ORAL_TABLET | Freq: Every day | ORAL | Status: DC

## 2018-11-29 MED ORDER — CLONAZEPAM 0.5 MG PO TABS: 0.5000 mg | ORAL_TABLET | Freq: Two times a day (BID) | ORAL | Status: DC | PRN

## 2018-11-29 MED ORDER — CALCIUM CARBONATE 1250 (500 CA) MG PO TABS
1000.0000 mg | ORAL_TABLET | Freq: Once | ORAL | Status: DC
  Filled 2018-11-29: qty 2

## 2018-11-29 MED ORDER — SODIUM CHLORIDE 0.9 % IV SOLN
Freq: Once | INTRAVENOUS | Status: AC
  Administered 2018-11-29: 02:00:00 via INTRAVENOUS

## 2018-11-29 MED ORDER — LORAZEPAM 1 MG PO TABS: 1.0000 mg | ORAL_TABLET | Freq: Four times a day (QID) | ORAL | Status: DC | PRN

## 2018-11-29 MED ORDER — SODIUM CHLORIDE 0.9 % IV BOLUS
500.0000 mL | Freq: Once | INTRAVENOUS | Status: AC
  Administered 2018-11-29: 500 mL via INTRAVENOUS

## 2018-11-29 MED ORDER — TRAMADOL HCL 50 MG PO TABS: 50.0000 mg | ORAL_TABLET | Freq: Four times a day (QID) | ORAL | Status: DC | PRN

## 2018-11-29 MED ORDER — PROPOFOL 10 MG/ML IV BOLUS
33.0000 mg | Freq: Once | INTRAVENOUS | Status: AC
  Administered 2018-11-29: 01:00:00 33 mg via INTRAVENOUS
  Filled 2018-11-29: qty 20

## 2018-11-29 MED ORDER — FENTANYL CITRATE (PF) 100 MCG/2ML IJ SOLN
50.0000 ug | Freq: Once | INTRAMUSCULAR | Status: DC
  Filled 2018-11-29: qty 2

## 2018-11-29 MED ORDER — ENOXAPARIN SODIUM 40 MG/0.4ML ~~LOC~~ SOLN: 40.0000 mg | SUBCUTANEOUS | Status: DC

## 2018-11-29 MED ORDER — VENLAFAXINE HCL ER 150 MG PO CP24
150.0000 mg | ORAL_CAPSULE | Freq: Every day | ORAL | Status: DC
  Administered 2018-11-29: 14:00:00 150 mg via ORAL
  Filled 2018-11-29: qty 1

## 2018-11-29 NOTE — ED Provider Notes (Signed)
[facility] Emergency Department Provider Note  ____________________________________________   First MD Initiated Contact with Patient 11/28/18 2310     (approximate)  I have reviewed the triage vital signs and the nursing notes.   HISTORY  Chief Complaint Hip Injury    HPI [name] is a 63 y.o. female with medical history as listed below which notably includes 2 prior dislocations of her left hip prosthesis (surgery originally done by Dr. Marry Guan).  She presents by EMS for evaluation of acute onset and severe pain likely secondary to hip dislocation.  She reports that she did not fall as she has the 2 prior times, she simply rolled over onto her left side to pick up her cat and she felt the hip pop out of place.  Pain is severe, some numbness, no other injuries, did not strike head, did not lose consciousness.  The patient drinks alcohol daily and reports that she had 3 beers "earlier this afternoon".  She claims that she has not had anything to eat or drink for 8 hours.  She denies sore throat, cough, shortness of breath, vomiting, abdominal pain, and dysuria.  She has been feeling some generalized weakness recently but attributes that to fatigue.  She has not been in contact with anyone with COVID-19.  Her last dislocation secondary to fall was about 4 months ago and she spent 2 months afterwards in a knee immobilizer.  She had a recent follow-up visit with Dr. Marry Guan who told her she may require surgery if her hip pops out again.  She received fentanyl 75 mcg IV prior to arrival and received another Dilaudid 1 mg IV after getting to the emergency department.         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[place]) 11/29/2018   Hip dislocation, left, sequela 08/06/2018   High anion gap metabolic acidosis 61/95/0932   Overdose of antidepressant 07/03/2016   S/P total hip arthroplasty 09/17/2015   Bipolar I disorder, most recent episode mixed, severe with psychotic features ([place]) 07/03/2015   Alcohol use disorder, severe, dependence ([place]) 01/26/2015   Tobacco use disorder 01/26/2015   Alcohol withdrawal ([place]) 01/08/2015   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[place]) 01/08/2015   GAD (generalized anxiety disorder) 12/24/2013   PTSD (post-traumatic stress disorder) 12/22/2013    Past Surgical History:  Procedure Laterality Date   ABDOMINAL HYSTERECTOMY     ABDOMINAL HYSTERECTOMY  2007   ARM AMPUTATION Left 1998   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   ARM AMPUTATION AT SHOULDER Left    [place]   x 2   ESOPHAGOGASTRODUODENOSCOPY (EGD) WITH PROPOFOL N/A 04/27/2018   Procedure: ESOPHAGOGASTRODUODENOSCOPY (EGD) WITH PROPOFOL;  Surgeon: Toledo, Benay Pike, MD;  Location: [facility] ENDOSCOPY;  Service: Gastroenterology;  Laterality: N/A;   JOINT REPLACEMENT Right 2012   hip   LUMBAR LAMINECTOMY/DECOMPRESSION MICRODISCECTOMY Left 04/16/2016   Procedure: LUMBAR LAMINECTOMY/DECOMPRESSION MICRODISCECTOMY 1 LEVEL;  Surgeon: Meade Maw, MD;  Location: [facility] ORS;  Service: Neurosurgery;  Laterality: Left;  Left L4-5 far lateral discectomy, left L4-5 laminoforaminotomy   TOTAL HIP ARTHROPLASTY Left 09/17/2015   Procedure: TOTAL HIP ARTHROPLASTY;  Surgeon: Dereck Leep, MD;  Location: [facility] ORS;  Service: Orthopedics;  Laterality: Left;    Prior to Admission medications   Medication Sig Start Date End Date Taking? Authorizing Provider  carbidopa-levodopa (SINEMET IR) 25-100 MG tablet Take 1 tablet by mouth 3 (three) times  daily.   Yes [provider]  clonazePAM (KLONOPIN) 0.5 MG tablet Take 0.5 mg by mouth 2 (two) times daily as needed for anxiety.   Yes [provider]  famotidine (PEPCID) 20 MG tablet Take 20 mg by mouth 2 (two) times daily.   Yes [provider]  gabapentin (NEURONTIN) 300 MG capsule Take 3 capsules (900 mg total) by mouth 3 (three) times daily. 05/21/17  Yes Pucilowska, [name], MD  Multiple Vitamin (MULTIVITAMIN WITH MINERALS) TABS tablet Take 1 tablet by mouth daily. 08/09/18  Yes Fritzi Mandes, MD  propranolol (INDERAL) 10 MG tablet Take 1 tablet (10 mg total) by mouth 3 (three) times daily. 05/21/17  Yes Pucilowska, [name], MD  traZODone (DESYREL) 150 MG tablet Take 150 mg by mouth at bedtime.    Yes [provider]  venlafaxine XR (EFFEXOR-XR) 150 MG 24 hr capsule Take 1 capsule (150 mg total) by mouth daily with breakfast. 05/21/17  Yes Pucilowska, [name], MD    Allergies Benadryl [diphenhydramine]; Benadryl [diphenhydramine]; Cephalosporins; Codeine; and Codeine  Family History  Problem Relation Age of Onset   COPD Mother     Social History Social History   Tobacco Use   Smoking status: Current Every Day Smoker    Packs/day: 0.25    Types: Cigarettes   Smokeless tobacco: Never Used  Substance Use Topics   Alcohol use: Yes    Comment: 2 40oz beers daily   Drug use: No    Comment: Patient denies     Review of Systems Constitutional: No fever/chills Eyes: No visual changes. ENT: No sore throat. Cardiovascular: Denies chest pain. Respiratory: Denies shortness of breath. Gastrointestinal: No abdominal pain.  No nausea, no vomiting.  No diarrhea.  No constipation. Genitourinary: Negative for dysuria. Musculoskeletal: Severe pain in the left hip with obvious deformity consistent with dislocation. Integumentary: Negative for rash. Neurological: Negative for headaches, focal weakness or  numbness.   ____________________________________________   PHYSICAL EXAM:  VITAL SIGNS: ED Triage Vitals  Enc Vitals Group     BP 11/28/18 2216 110/60     Pulse Rate 11/28/18 2216 69     Resp 11/28/18 2216 16     Temp 11/28/18 2216 98.2 F (36.8 C)     Temp Source 11/28/18 2216 Oral     SpO2 11/28/18 2208 92 %     Weight 11/28/18 2214 66.7 kg (147 lb)     Height 11/28/18 2214 1.651 m (5\' 5" )     Head Circumference --      Peak Flow --      Pain Score 11/28/18 2213 10     Pain Loc --      Pain Edu? --      Excl. in [place]? --     Constitutional: Alert and oriented.  Appears uncomfortable but is not in severe distress at this time. Eyes: Conjunctivae are normal.  Head: Atraumatic. Nose: No congestion/rhinnorhea. Mouth/Throat: Mucous membranes are moist. Neck: No stridor.  No meningeal  signs.   Cardiovascular: Normal rate, regular rhythm. Good peripheral circulation. Grossly normal heart sounds. Respiratory: Normal respiratory effort.  No retractions. No audible wheezing. Gastrointestinal: Soft and nontender. No distention.  Musculoskeletal: Absent left upper extremity (presumably congenital).  Patient's left lower extremity is internally rotated and shortened consistent with dislocation.  Severe pain with any attempt at movement. Neurologic:  Normal speech and language. No gross focal neurologic deficits are appreciated.  Slightly slurred speech suggest possible intoxication.  Otherwise neurologically intact. Skin:  Skin is warm, dry and intact. No rash noted. Psychiatric: Mood and affect are somewhat labile, patient was initially argumentative but calm down after speaking with the charge nurse.  She is somewhat emotional when talking about her injury and became very anxious when I talked about whether or not she would be able to go home after this.  Of note, she informed her nurse, Apolonio Schneiders, after I left the room that she has been having suicidal thoughts and is thinking about  overdosing on pills.  This, was made after Apolonio Schneiders mention to her that she might be discharged if the procedure goes well.  ____________________________________________   LABS (all labs ordered are listed, but only abnormal results are displayed)  Labs Reviewed  CBC WITH DIFFERENTIAL/PLATELET - Abnormal; Notable for the following components:      Result Value   Abs Immature Granulocytes 0.23 (*)    All other components within normal limits  ETHANOL - Abnormal; Notable for the following components:   Alcohol, Ethyl (B) 130 (*)    All other components within normal limits  COMPREHENSIVE METABOLIC PANEL - Abnormal; Notable for the following components:   Sodium 125 (*)    Potassium 2.7 (*)    Chloride 88 (*)    Glucose, Bld 67 (*)    Calcium 8.3 (*)    All other components within normal limits  ACETAMINOPHEN LEVEL - Abnormal; Notable for the following components:   Acetaminophen (Tylenol), Serum <10 (*)    All other components within normal limits  URINALYSIS, ROUTINE W REFLEX MICROSCOPIC - Abnormal; Notable for the following components:   Color, Urine COLORLESS (*)    APPearance CLEAR (*)    Specific Gravity, Urine 1.003 (*)    Hgb urine dipstick SMALL (*)    All other components within normal limits  SARS CORONAVIRUS 2 (HOSPITAL ORDER, [place] [facility])  MAGNESIUM  SALICYLATE LEVEL  URINE DRUG SCREEN, QUALITATIVE ([place])  BASIC METABOLIC PANEL  CBC  PROTIME-INR   ____________________________________________  EKG  ED ECG REPORT I, Hinda Kehr, the attending physician, personally viewed and interpreted this ECG.  Date: 11/29/2018 EKG Time: 2:00 AM Rate: 78 Rhythm: normal sinus rhythm QRS Axis: normal Intervals: normal ST/T Wave abnormalities: normal Narrative Interpretation: no evidence of acute ischemia  ____________________________________________  RADIOLOGY I, Hinda Kehr, personally viewed and evaluated these images (plain  radiographs) as part of my medical decision making, as well as reviewing the written report by the radiologist.  ED MD interpretation: Initial radiographs demonstrated superior and posterior dislocation of left hip arthroplasty without any evidence of fracture.  Postreduction film demonstrates appropriate positioning status post successful reduction.  Official radiology report(s): Dg Hip Port Unilat W Or Wo Pelvis 1 View Left  Result Date: 11/29/2018 CLINICAL DATA:  63 year old female status post reduction of recurrent left hip arthroplasty dislocation. EXAM: DG HIP (WITH OR WITHOUT PELVIS) 1V PORT LEFT COMPARISON:  11/28/2018 and earlier. FINDINGS: Single AP view at 0116 hours. Resumed normal AP alignment  of the left hip arthroplasty. Hardware appears intact without loosening. No acute osseous abnormality identified. IMPRESSION: Normal AP alignment suggesting reduction of the dislocated left hip arthroplasty. No acute osseous abnormality identified. Electronically Signed   By: Genevie Nyna M.D.   On: 11/29/2018 01:40   Dg Hip Unilat With Pelvis 2-3 Views Left  Result Date: 11/28/2018 CLINICAL DATA:  63 year old female with left hip pain, suspected recurrent prosthetic dislocation. EXAM: DG HIP (WITH OR WITHOUT PELVIS) 2-3V LEFT COMPARISON:  08/06/2018 and earlier. FINDINGS: Recurrent superior and slightly posterior dislocation of the bipolar left hip arthroplasty, very similar configuration to that on 08/06/2018. No hardware fracture identified. Normal AP alignment of the right hip arthroplasty. Osteopenia but no acute fracture identified. Chronic pelvic phleboliths. Negative visible bowel gas pattern. 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 Electronically Signed   By: Genevie Caya M.D.   On: 11/28/2018 23:44    ____________________________________________   PROCEDURES   Procedure(s) performed (including Critical Care):  .Sedation Date/Time: 11/29/2018  12:12 AM Performed by: Hinda Kehr, MD Authorized by: Hinda Kehr, MD   Consent:    Consent obtained:  Written and emergent situation (electronic informed consent)   Consent given by:  Patient   Risks discussed:  Allergic reaction, dysrhythmia, inadequate sedation, nausea, vomiting, respiratory compromise necessitating ventilatory assistance and intubation, prolonged sedation necessitating reversal and prolonged hypoxia resulting in organ damage Universal protocol:    Procedure explained and questions answered to patient or proxy's satisfaction: yes     Relevant documents present and verified: yes     Test results available and properly labeled: yes     Imaging studies available: yes     Required blood products, implants, devices, and special equipment available: yes     Site/side marked: yes     Immediately prior to procedure a time out was called: yes     Patient identity confirmation method:  Arm band and verbally with patient Indications:    Procedure performed:  Dislocation reduction   Procedure necessitating sedation performed by:  Physician performing sedation Pre-sedation assessment:    Time since last food or drink:  8 hours   ASA classification: class 2 - patient with mild systemic disease     Neck mobility: normal     Mouth opening:  3 or more finger widths   Mallampati score:  III - soft palate, base of uvula visible   Pre-sedation assessments completed and reviewed: airway patency, cardiovascular function, hydration status, mental status, nausea/vomiting, pain level, respiratory function and temperature     Pre-sedation assessment completed:  11/29/2018 12:13 AM Immediate pre-procedure details:    Reassessment: Patient reassessed immediately prior to procedure     Reviewed: vital signs, relevant labs/tests and NPO status     Verified: bag valve mask available, emergency equipment available, intubation equipment available, IV patency confirmed, oxygen available, reversal  medications available and suction available   Procedure details (see MAR for exact dosages):    Preoxygenation:  Nasal cannula   Sedation:  Propofol   Analgesia:  Hydromorphone and fentanyl (patient received both analgesic agents prior to the sedation/reduction procedures)   Intra-procedure monitoring:  Blood pressure monitoring, continuous pulse oximetry, cardiac monitor, frequent vital sign checks and frequent LOC assessments   Total Provider sedation time (minutes):  7 Post-procedure details:    Post-sedation assessment completed:  11/29/2018 1:24 AM   Attendance: Constant attendance by certified staff until patient recovered     Recovery: Patient returned to  pre-procedure baseline     Post-sedation assessments completed and reviewed: airway patency, cardiovascular function, hydration status, mental status, nausea/vomiting, pain level and respiratory function     Patient is stable for discharge or admission: yes     Patient tolerance:  Tolerated well, no immediate complications Reduction of dislocation Date/Time: 11/29/2018 12:50 AM Performed by: Hinda Kehr, MD Authorized by: Hinda Kehr, MD  Consent: The procedure was performed in an emergent situation. Verbal consent obtained. Written consent obtained. Risks and benefits: risks, benefits and alternatives were discussed Consent given by: patient Patient understanding: patient states understanding of the procedure being performed Patient consent: the patient's understanding of the procedure matches consent given Procedure consent: procedure consent matches procedure scheduled Relevant documents: relevant documents present and verified Test results: test results available and properly labeled Site marked: the operative site was marked Imaging studies: imaging studies available Required items: required blood products, implants, devices, and special equipment available Patient identity confirmed: verbally with patient and arm band Time  out: Immediately prior to procedure a "time out" was called to verify the correct patient, procedure, equipment, support staff and site/side marked as required. Local anesthesia used: no  Anesthesia: Local anesthesia used: no  Sedation: Patient sedated: yes (see separate sedation note) Vitals: Vital signs were monitored during sedation.  Patient tolerance: Patient tolerated the procedure well with no immediate complications Comments: Patient required two separate doses of propofol 0.5 mg/kg (30 mg) in quick succession to allow sufficient relaxation. Patient woke up almost immediately after the procedure. No respiratory compromise.  Neurovascularly intact after the reduction, normal sensation and able to wiggle toes without difficulty.      ____________________________________________   INITIAL IMPRESSION / MDM / ASSESSMENT AND PLAN / ED COURSE  As part of my medical decision making, I reviewed the following data within the [facility] notes reviewed and incorporated, Labs reviewed , EKG interpreted , Old chart reviewed, Discussed with orthopedics by phone (Dr. Posey Pronto with [facility]), Discussed with admitting provider Gardiner Barefoot) and Notes from prior ED visits      *[name] was evaluated in Emergency Department on 11/29/2018 for the symptoms described in the history of present illness. She was evaluated in the context of the global COVID-19 pandemic, which necessitated consideration that the patient might be at risk for infection with the SARS-CoV-2 virus that causes COVID-19. Institutional protocols and algorithms that pertain to the evaluation of patients at risk for COVID-19 are in a state of rapid change based on information released by regulatory bodies including the CDC and federal and state organizations. These policies and algorithms were followed during the patient's care in the ED.  Some ED evaluations and interventions may be delayed as a result of  limited staffing during the pandemic.*  Differential diagnosis includes, but is not limited to, recurrent prosthetic hip dislocation, hip fracture, displacement or malfunction of the hardware, less likely other musculoskeletal injury.  The patient is a chronic alcoholic but she is adamant that she has not fallen and she has no other injuries or sequela of injury.  She is very honest that admits that she fell on the 2 prior incidents.  Radiographs demonstrate superior and posterior dislocation without any obvious fracture or hardware issue.  Given that the patient is requiring multiple doses of analgesia prior to the sedation and the fact that she is an alcoholic, 64 years old with comorbidities, etc., this is a less than ideal sedation candidate, but it is an urgent/emergent  procedure and she claims she is 8 hours n.p.o.  We will proceed with sedation and attempted reduction of the left hip.  I had my usual and customary risks and benefits discussion regarding procedural sedation and regarding the reduction procedure.  She understands and agrees with the plan.  I am sending blood work and coronavirus swab in case the patient requires admission.  As documented in the hospital course below and above, she is now claiming that she is suicidal as well so I anticipate she will need a psychiatric consult whether or not she is admitted medically but that is less of an urgent issue and she has no intention to leave anytime soon so I am not putting her under involuntary commitment.  I think she is a low risk of suicide.  Clinical Course as of Nov 28 229  Mon Nov 29, 2018  0027 Patient is now saying she has occasional suicidal ideation and has been thinking about overdosing on pills.  I will hold off on IVC paperwork but anticipate keeping the patient for psych evaluation. I suspect her current    [CF]  0122 Ethanol level 130.  Urine drug screen negative.  Urinalysis unremarkable other than a very small amount  hemoglobin of unclear significance.  CBC is within normal limits.   [CF]  0122 Clinically successful reduction.  Awaiting repeat imaging.  Patient is awake, alert, communicative, no acute distress, reports pain is much better.   [CF]  0136 Postreduction film is reassuring and the hip looks to be back in place.  Labs are notable for an acute hyponatremia of 125 and hypokalemia of 2.7.  These are likely due to alcoholism and she will require some gentle fluids as well as 40 mEq by mouth of potassium and potassium by IV.  Magnesium level is within normal limits.  I have put the patient on CIWA protocol and ordered thiamine 100 mg IV as well.   [CF]  0138 I spoke by phone with Dr. Posey Pronto, orthopedics colleague of Dr. Marry Guan.  He agrees with my management thus far including the hip immobilizer and agrees with my plan to admit to the hospitalist with orthopedics and PT/OT consults.  I will page the hospitalist.   [CF]  0145 I have updated the patient about the plan.  She agrees with the plan to talk with psychiatry in the hospital and is willing to contract for safety.  I do not feel she needs involuntary commitment.   [CF]  0149 Spoke by phone with Gardiner Barefoot of the hospitalist service.  We discussed the case in detail and she will admit.   [CF]  0202 Patient is a bit hypoglycemic (FSBS in the 60s), will let her eat   [CF]  0231 SARS Coronavirus 2: NEGATIVE [CF]    Clinical Course User Index [CF] Hinda Kehr, MD     ____________________________________________  FINAL CLINICAL IMPRESSION(S) / ED DIAGNOSES  Final diagnoses:  Hip dislocation, left, initial encounter [facility])  History of bilateral hip arthroplasty  Alcohol abuse  Hyponatremia  Hypokalemia  Alcoholic intoxication with complication ([place])     MEDICATIONS GIVEN DURING THIS VISIT:  Medications  fentaNYL (SUBLIMAZE) injection 50 mcg (0 mcg Intravenous Hold 11/29/18 0131)  potassium chloride 10 mEq in 100 mL IVPB (10 mEq  Intravenous New Bag/Given 11/29/18 0151)  sodium chloride flush (NS) 0.9 % injection 3 mL (has no administration in time range)  sodium chloride flush (NS) 0.9 % injection 3 mL (has no administration in time  range)  sodium chloride flush (NS) 0.9 % injection 3 mL (has no administration in time range)  0.9 %  sodium chloride infusion (has no administration in time range)  ondansetron (ZOFRAN) tablet 4 mg (has no administration in time range)    Or  ondansetron (ZOFRAN) injection 4 mg (has no administration in time range)  polyethylene glycol (MIRALAX / GLYCOLAX) packet 17 g (has no administration in time range)  traMADol (ULTRAM) tablet 50 mg (has no administration in time range)  acetaminophen (TYLENOL) tablet 650 mg (has no administration in time range)    Or  acetaminophen (TYLENOL) suppository 650 mg (has no administration in time range)  folic acid (FOLVITE) tablet 1 mg (has no administration in time range)  multivitamin with minerals tablet 1 tablet (has no administration in time range)  thiamine (VITAMIN B-1) tablet 100 mg (has no administration in time range)  LORazepam (ATIVAN) tablet 1 mg (has no administration in time range)    Or  LORazepam (ATIVAN) injection 1 mg (has no administration in time range)  thiamine (VITAMIN B-1) tablet 100 mg (has no administration in time range)    Or  thiamine (B-1) injection 100 mg (has no administration in time range)  folic acid (FOLVITE) tablet 1 mg (has no administration in time range)  multivitamin with minerals tablet 1 tablet (has no administration in time range)  HYDROmorphone (DILAUDID) injection 1 mg (1 mg Intravenous Given 11/28/18 2250)  propofol (DIPRIVAN) 10 mg/mL bolus/IV push 33 mg (33 mg Intravenous Given 11/29/18 0103)  ondansetron (ZOFRAN) injection 4 mg (4 mg Intravenous Given 11/29/18 0102)  sodium chloride 0.9 % bolus 500 mL (500 mLs Intravenous New Bag/Given 11/29/18 0126)  potassium chloride SA (K-DUR) CR tablet 40 mEq (40 mEq Oral  Given 11/29/18 0143)  thiamine (B-1) injection 100 mg (100 mg Intravenous Given 11/29/18 0142)  0.9 %  sodium chloride infusion ( Intravenous New Bag/Given 11/29/18 0154)     ED Discharge Orders    None       Note:  This document was prepared using Dragon voice recognition software and may include unintentional dictation errors.   Hinda Kehr, MD 11/29/18 [phone]

## 2018-11-29 NOTE — ED Notes (Signed)
Pt provided w/ [place] sandwich tray and ginger ale for low glucose.

## 2018-11-29 NOTE — Progress Notes (Signed)
PT Cancellation Note  Patient Details Name: [name] MRN: [id number] DOB: September 09, 1955   Cancelled Treatment:    Reason Eval/Treat Not Completed: Other (comment). Consult received and chart reviewed. Pt is s/p L hip reduction following dislocation of L hip prosthesis. Pending ortho consult, will hold at this time until cleared for participation.   Vian Fluegel 11/29/2018, 8:49 AM  Greggory Stallion, PT, DPT [phone]

## 2018-11-29 NOTE — ED Notes (Addendum)
ED TO INPATIENT HANDOFF REPORT  ED Nurse Name and Phone #: [name] Name/Age/Gender [name] 63 y.o. female Room/Bed: ED15A/ED15A  Code Status   Code Status: Full Code  Home/SNF/Other Rehab Patient oriented to: self, place, time and situation Is this baseline? Yes   Triage Complete: Triage complete  Chief Complaint Lt hip displacement  Triage Note Pt to ED with c/o of left hip pain. Pt had hip surgery approx 2 years ago. Pt states this is the 3rd displacement since her surgery. Obvious deformity noted upon assessment.    Allergies Allergies  Allergen Reactions  . Benadryl [Diphenhydramine] Hives and Other (See Comments)    Reaction:  Hyperactivity   . Benadryl [Diphenhydramine] Other (See Comments)    My jaw locked and I could not speak, like a stroke.  . Cephalosporins Hives  . Codeine Nausea And Vomiting  . Codeine Nausea Only    severe    Level of Care/Admitting Diagnosis ED Disposition    ED Disposition Condition [facility] Area: [place] [[id number]]  Level of Care: Med-Surg [16]  Covid Evaluation: Confirmed COVID Negative  Diagnosis: Displacement of internal left hip prosthesis [facility]) [[id number]]  Admitting Physician: Christel Mormon [[id number]]  Attending Physician: Christel Mormon [[id number]]  Estimated length of stay: past midnight tomorrow  Certification:: I certify this patient will need inpatient services for at least 2 midnights  PT Class (Do Not Modify): Inpatient [101]  PT Acc Code (Do Not Modify): Private [1]       B Medical/Surgery History Past Medical History:  Diagnosis Date  . Anxiety   . Anxiety    panic anxiety disorder  . Arthritis   . Asthma    chronic asthmatic bronchitis  . Bipolar disorder ([place])   . Cancer [facility])    Desmoid tumor left forearm  . Depression   . ETOH abuse   . Heart murmur   . Insomnia   . Neuropathy    Past Surgical History:  Procedure Laterality Date  .  ABDOMINAL HYSTERECTOMY    . ABDOMINAL HYSTERECTOMY  2007  . 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 ARM AMPUTATION AT SHOULDER Left   . CESAREAN SECTION    . [place]   x 2  . ESOPHAGOGASTRODUODENOSCOPY (EGD) WITH PROPOFOL N/A 04/27/2018   Procedure: ESOPHAGOGASTRODUODENOSCOPY (EGD) WITH PROPOFOL;  Surgeon: Toledo, Benay Pike, MD;  Location: [facility] ENDOSCOPY;  Service: Gastroenterology;  Laterality: N/A;  . JOINT REPLACEMENT Right 2012   hip  . LUMBAR LAMINECTOMY/DECOMPRESSION MICRODISCECTOMY Left 04/16/2016   Procedure: LUMBAR LAMINECTOMY/DECOMPRESSION MICRODISCECTOMY 1 LEVEL;  Surgeon: Meade Maw, MD;  Location: [facility] ORS;  Service: Neurosurgery;  Laterality: Left;  Left L4-5 far lateral discectomy, left L4-5 laminoforaminotomy  . TOTAL HIP ARTHROPLASTY Left 09/17/2015   Procedure: TOTAL HIP ARTHROPLASTY;  Surgeon: Dereck Leep, MD;  Location: [facility] ORS;  Service: Orthopedics;  Laterality: Left;     A IV Location/Drains/Wounds Patient Lines/Drains/Airways Status   Active Line/Drains/Airways    Name:   Placement date:   Placement time:   Site:   Days:   Peripheral IV 11/28/18 Right Antecubital   11/28/18    -    Antecubital   1          Intake/Output Last 24 hours  Intake/Output Summary (Last 24 hours) at 11/29/2018 0310 Last data filed at 11/29/2018 0114 Gross per 24 hour  Intake 400  ml  Output 450 ml  Net -50 ml    Labs/Imaging Results for orders placed or performed during the hospital encounter of 11/28/18 (from the past 48 hour(s))  CBC with Differential/Platelet     Status: Abnormal   Collection Time: 11/29/18 12:41 AM  Result Value Ref Range   WBC 9.7 4.0 - 10.5 K/uL   RBC 4.08 3.87 - 5.11 MIL/uL   Hemoglobin 13.6 12.0 - 15.0 g/dL   HCT 37.8 36.0 - 46.0 %   MCV 92.6 80.0 - 100.0 fL   MCH 33.3 26.0 - 34.0 pg   MCHC 36.0 30.0 - 36.0 g/dL   RDW 14.6 11.5 - 15.5 %   Platelets 288 150 - 400 K/uL   nRBC 0.0 0.0 - 0.2 %    Neutrophils Relative % 50 %   Neutro Abs 4.9 1.7 - 7.7 K/uL   Lymphocytes Relative 37 %   Lymphs Abs 3.6 0.7 - 4.0 K/uL   Monocytes Relative 8 %   Monocytes Absolute 0.8 0.1 - 1.0 K/uL   Eosinophils Relative 2 %   Eosinophils Absolute 0.2 0.0 - 0.5 K/uL   Basophils Relative 1 %   Basophils Absolute 0.1 0.0 - 0.1 K/uL   Immature Granulocytes 2 %   Abs Immature Granulocytes 0.23 (H) 0.00 - 0.07 K/uL    Comment: Performed at [facility], [address] [place], [place] [address]  Ethanol     Status: Abnormal   Collection Time: 11/29/18 12:41 AM  Result Value Ref Range   Alcohol, Ethyl (B) 130 (H) <10 mg/dL    Comment: (NOTE) Lowest detectable limit for serum alcohol is 10 mg/dL. For medical purposes only. Performed at [facility], [address] [place], [place] [address]   Comprehensive metabolic panel     Status: Abnormal   Collection Time: 11/29/18 12:41 AM  Result Value Ref Range   Sodium 125 (L) 135 - 145 mmol/L   Potassium 2.7 (LL) 3.5 - 5.1 mmol/L    Comment: CRITICAL RESULT CALLED TO, READ BACK BY AND VERIFIED WITH APRIL BUMGARD AT 0124 11/29/2018.  TFK    Chloride 88 (L) 98 - 111 mmol/L   CO2 25 22 - 32 mmol/L   Glucose, Bld 67 (L) 70 - 99 mg/dL   BUN 10 8 - 23 mg/dL   Creatinine, Ser 0.57 0.44 - 1.00 mg/dL   Calcium 8.3 (L) 8.9 - 10.3 mg/dL   Total Protein 7.0 6.5 - 8.1 g/dL   Albumin 4.0 3.5 - 5.0 g/dL   AST 15 15 - 41 U/L   ALT 7 0 - 44 U/L   Alkaline Phosphatase 69 38 - 126 U/L   Total Bilirubin 0.5 0.3 - 1.2 mg/dL   GFR calc non Af Amer >60 >60 mL/min   GFR calc Af Amer >60 >60 mL/min   Anion gap 12 5 - 15    Comment: Performed at [facility], [address] [place], [place] [address]  Magnesium     Status: None   Collection Time: 11/29/18 12:41 AM  Result Value Ref Range   Magnesium 2.0 1.7 - 2.4 mg/dL    Comment: Performed at [facility], [address], [place], [place] [address]  SARS Coronavirus 2  (CEPHEID - Performed in [place] hospital lab), [facility]     Status: None   Collection Time: 11/29/18 12:41 AM  Result Value Ref Range   SARS Coronavirus 2 NEGATIVE NEGATIVE    Comment: (NOTE)  If result is NEGATIVE SARS-CoV-2 target nucleic acids are NOT DETECTED. The SARS-CoV-2 RNA is generally detectable in upper and lower  respiratory specimens during the acute phase of infection. The lowest  concentration of SARS-CoV-2 viral copies this assay can detect is 250  copies / mL. A negative result does not preclude SARS-CoV-2 infection  and should not be used as the sole basis for treatment or other  patient management decisions.  A negative result may occur with  improper specimen collection / handling, submission of specimen other  than nasopharyngeal swab, presence of viral mutation(s) within the  areas targeted by this assay, and inadequate number of viral copies  (<250 copies / mL). A negative result must be combined with clinical  observations, patient history, and epidemiological information. If result is POSITIVE SARS-CoV-2 target nucleic acids are DETECTED. The SARS-CoV-2 RNA is generally detectable in upper and lower  respiratory specimens dur ing the acute phase of infection.  Positive  results are indicative of active infection with SARS-CoV-2.  Clinical  correlation with patient history and other diagnostic information is  necessary to determine patient infection status.  Positive results do  not rule out bacterial infection or co-infection with other viruses. If result is PRESUMPTIVE POSTIVE SARS-CoV-2 nucleic acids MAY BE PRESENT.   A presumptive positive result was obtained on the submitted specimen  and confirmed on repeat testing.  While 2019 novel coronavirus  (SARS-CoV-2) nucleic acids may be present in the submitted sample  additional confirmatory testing may be necessary for epidemiological  and / or clinical management purposes  to differentiate between   SARS-CoV-2 and other Sarbecovirus currently known to infect humans.  If clinically indicated additional testing with an alternate test  methodology [phone]) is advised. The SARS-CoV-2 RNA is generally  detectable in upper and lower respiratory sp ecimens during the acute  phase of infection. The expected result is Negative. Fact Sheet for Patients:  StrictlyIdeas.no Fact Sheet for Healthcare Providers: BankingDealers.co.za This test is not yet approved or cleared by the [place] FDA and has been authorized for detection and/or diagnosis of SARS-CoV-2 by FDA under an Emergency Use Authorization (EUA).  This EUA will remain in effect (meaning this test can be used) for the duration of the COVID-19 declaration under Section 564(b)(1) of the Act, 21 U.S.C. section 360bbb-3(b)(1), unless the authorization is terminated or revoked sooner. Performed at [facility], [address] [place], [place] [address]   Acetaminophen level     Status: Abnormal   Collection Time: 11/29/18 12:41 AM  Result Value Ref Range   Acetaminophen (Tylenol), Serum <10 (L) 10 - 30 ug/mL    Comment: (NOTE) Therapeutic concentrations vary significantly. A range of 10-30 ug/mL  may be an effective concentration for many patients. However, some  are best treated at concentrations outside of this range. Acetaminophen concentrations >150 ug/mL at 4 hours after ingestion  and >50 ug/mL at 12 hours after ingestion are often associated with  toxic reactions. Performed at [facility], [address] [place], [place] [address]   Salicylate level     Status: None   Collection Time: 11/29/18 12:41 AM  Result Value Ref Range   Salicylate Lvl <8.1 2.8 - 30.0 mg/dL    Comment: Performed at [facility], [address] [place], [place] [address]  Urinalysis, Routine w reflex microscopic     Status: Abnormal   Collection Time: 11/29/18  12:41 AM  Result Value Ref Range   Color, Urine COLORLESS (  A) YELLOW   APPearance CLEAR (A) CLEAR   Specific Gravity, Urine 1.003 (L) 1.005 - 1.030   pH 6.0 5.0 - 8.0   Glucose, UA NEGATIVE NEGATIVE mg/dL   Hgb urine dipstick SMALL (A) NEGATIVE   Bilirubin Urine NEGATIVE NEGATIVE   Ketones, ur NEGATIVE NEGATIVE mg/dL   Protein, ur NEGATIVE NEGATIVE mg/dL   Nitrite NEGATIVE NEGATIVE   Leukocytes,Ua NEGATIVE NEGATIVE   RBC / HPF 0-5 0 - 5 RBC/hpf   WBC, UA NONE SEEN 0 - 5 WBC/hpf   Bacteria, UA NONE SEEN NONE SEEN   Squamous Epithelial / LPF 0-5 0 - 5    Comment: Performed at [facility], [address], [place], [place] [address]  Urine Drug Screen, Qualitative ([facility] only)     Status: None   Collection Time: 11/29/18 12:42 AM  Result Value Ref Range   Tricyclic, Ur Screen NONE DETECTED NONE DETECTED   Amphetamines, Ur Screen NONE DETECTED NONE DETECTED   MDMA (Ecstasy)Ur Screen NONE DETECTED NONE DETECTED   Cocaine Metabolite,Ur [place] NONE DETECTED NONE DETECTED   Opiate, Ur Screen NONE DETECTED NONE DETECTED   Phencyclidine (PCP) Ur S NONE DETECTED NONE DETECTED   Cannabinoid 50 Ng, Ur [place] NONE DETECTED NONE DETECTED   Barbiturates, Ur Screen NONE DETECTED NONE DETECTED   Benzodiazepine, Ur Scrn NONE DETECTED NONE DETECTED   Methadone Scn, Ur NONE DETECTED NONE DETECTED    Comment: (NOTE) Tricyclics + metabolites, urine    Cutoff 1000 ng/mL Amphetamines + metabolites, urine  Cutoff 1000 ng/mL MDMA (Ecstasy), urine              Cutoff 500 ng/mL Cocaine Metabolite, urine          Cutoff 300 ng/mL Opiate + metabolites, urine        Cutoff 300 ng/mL Phencyclidine (PCP), urine         Cutoff 25 ng/mL Cannabinoid, urine                 Cutoff 50 ng/mL Barbiturates + metabolites, urine  Cutoff 200 ng/mL Benzodiazepine, urine              Cutoff 200 ng/mL Methadone, urine                   Cutoff 300 ng/mL The urine drug screen provides only a preliminary,  unconfirmed analytical test result and should not be used for non-medical purposes. Clinical consideration and professional judgment should be applied to any positive drug screen result due to possible interfering substances. A more specific alternate chemical method must be used in order to obtain a confirmed analytical result. Gas chromatography / mass spectrometry (GC/MS) is the preferred confirmat ory method. Performed at [name], [address] [place], [place] [address]    Dg Hip [place] Or [place] Pelvis 1 View Left  Result Date: 11/29/2018 CLINICAL DATA:  63 year old female status post reduction of recurrent left hip arthroplasty dislocation. EXAM: DG HIP (WITH OR WITHOUT PELVIS) 1V PORT LEFT COMPARISON:  11/28/2018 and earlier. FINDINGS: Single AP view at 0116 hours. Resumed normal AP alignment of the left hip arthroplasty. Hardware appears intact without loosening. No acute osseous abnormality identified. IMPRESSION: Normal AP alignment suggesting reduction of the dislocated left hip arthroplasty. No acute osseous abnormality identified. Electronically Signed   By: Genevie Josie M.D.   On: 11/29/2018 01:40   Dg Hip Unilat With Pelvis 2-3 Views Left  Result Date: 11/28/2018 CLINICAL DATA:  63 year old  female with left hip pain, suspected recurrent prosthetic dislocation. EXAM: DG HIP (WITH OR WITHOUT PELVIS) 2-3V LEFT COMPARISON:  08/06/2018 and earlier. FINDINGS: Recurrent superior and slightly posterior dislocation of the bipolar left hip arthroplasty, very similar configuration to that on 08/06/2018. No hardware fracture identified. Normal AP alignment of the right hip arthroplasty. Osteopenia but no acute fracture identified. Chronic pelvic phleboliths. Negative visible bowel gas pattern. 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 Electronically Signed   By: Genevie Kaleiyah M.D.   On: 11/28/2018 23:44    Pending Labs Unresulted  Labs (From admission, onward)    Start     Ordered   11/29/18 6195  Basic metabolic panel  Tomorrow morning,   STAT     11/29/18 0225   11/29/18 0500  CBC  Tomorrow morning,   STAT     11/29/18 0225   11/29/18 0500  Protime-INR  Tomorrow morning,   STAT     11/29/18 0225          Vitals/Pain Today's Vitals   11/29/18 0145 11/29/18 0200 11/29/18 0215 11/29/18 0230  BP: (!) 129/53 129/61 109/61 (!) 119/57  Pulse: 81 78 83 78  Resp: 16 12 11 11   Temp:      TempSrc:      SpO2: 100% 95% 97% 97%  Weight:      Height:      PainSc:        Isolation Precautions No active isolations  Medications Medications  fentaNYL (SUBLIMAZE) injection 50 mcg (0 mcg Intravenous Hold 11/29/18 0131)  potassium chloride 10 mEq in 100 mL IVPB (10 mEq Intravenous New Bag/Given 11/29/18 0151)  sodium chloride flush (NS) 0.9 % injection 3 mL (has no administration in time range)  sodium chloride flush (NS) 0.9 % injection 3 mL (has no administration in time range)  sodium chloride flush (NS) 0.9 % injection 3 mL (has no administration in time range)  0.9 %  sodium chloride infusion (has no administration in time range)  ondansetron (ZOFRAN) tablet 4 mg (has no administration in time range)    Or  ondansetron (ZOFRAN) injection 4 mg (has no administration in time range)  polyethylene glycol (MIRALAX / GLYCOLAX) packet 17 g (has no administration in time range)  traMADol (ULTRAM) tablet 50 mg (has no administration in time range)  acetaminophen (TYLENOL) tablet 650 mg (has no administration in time range)    Or  acetaminophen (TYLENOL) suppository 650 mg (has no administration in time range)  gabapentin (NEURONTIN) capsule 900 mg (has no administration in time range)  propranolol (INDERAL) tablet 10 mg (has no administration in time range)  venlafaxine XR (EFFEXOR-XR) 24 hr capsule 150 mg (has no administration in time range)  carbidopa-levodopa (SINEMET IR) 25-100 MG per tablet immediate release 1  tablet (has no administration in time range)  clonazePAM (KLONOPIN) tablet 0.5 mg (has no administration in time range)  famotidine (PEPCID) tablet 20 mg (has no administration in time range)  traZODone (DESYREL) tablet 150 mg (has no administration in time range)  LORazepam (ATIVAN) tablet 1 mg (has no administration in time range)    Or  LORazepam (ATIVAN) injection 1 mg (has no administration in time range)  thiamine (VITAMIN B-1) tablet 100 mg (has no administration in time range)    Or  thiamine (B-1) injection 100 mg (has no administration in time range)  folic acid (FOLVITE) tablet 1 mg (has no administration in time range)  multivitamin with minerals  tablet 1 tablet (has no administration in time range)  enoxaparin (LOVENOX) injection 40 mg (has no administration in time range)  HYDROmorphone (DILAUDID) injection 1 mg (1 mg Intravenous Given 11/28/18 2250)  propofol (DIPRIVAN) 10 mg/mL bolus/IV push 33 mg (33 mg Intravenous Given 11/29/18 0103)  ondansetron (ZOFRAN) injection 4 mg (4 mg Intravenous Given 11/29/18 0102)  sodium chloride 0.9 % bolus 500 mL (500 mLs Intravenous New Bag/Given 11/29/18 0126)  potassium chloride SA (K-DUR) CR tablet 40 mEq (40 mEq Oral Given 11/29/18 0143)  thiamine (B-1) injection 100 mg (100 mg Intravenous Given 11/29/18 0142)  0.9 %  sodium chloride infusion ( Intravenous New Bag/Given 11/29/18 0154)    Mobility manual wheelchair High fall risk   Focused Assessments Musculoskeletal Cardiac psychiatric   R Recommendations: See Admitting Provider Note  Report given to: Junie Panning, RN on 1A  Additional Notes:

## 2018-11-29 NOTE — Consult Note (Signed)
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 Recent;   Fair Remote;   Fair  Judgement:  Fair  Insight:  Fair  Psychomotor Activity:  Decreased  Concentration:  Concentration: Fair  Recall:  [employer] of Knowledge:  Fair  Language:  Fair  Akathisia:  No  Handed:  Right  AIMS (if indicated):     Assets:  Desire for Improvement Resilience  ADL's:  Intact  Cognition:  WNL  Sleep:   Patient reports adequate     Treatment Plan Summary: Medication management  Discharge with home prescription of Klonopin 0.5 mg BID for one month supply. Continue other outpatient psychiatric  medication her outpatient psychiatrist. Continue in alcohol rehabilitation program with outpatient provider, currently in treatment  Disposition: No  evidence of imminent risk to self or others at present.   Supportive therapy provided about ongoing stressors. Discussed crisis plan, support from social network, calling 911, coming to the Emergency Department, and calling Suicide Hotline.  Patient is able to report support systems and contract/plan for safety. Patient may be discharged into the care of her friend, Leslie Dales.  Lavella Hammock, MD 11/29/2018 2:55 PM

## 2018-11-29 NOTE — Discharge Summary (Addendum)
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 Anxiety   . Anxiety    panic anxiety disorder  . Arthritis   . Asthma    chronic asthmatic bronchitis  . Bipolar disorder ([place])   . Cancer [name])    Desmoid tumor left forearm  . Depression   . ETOH abuse   . Heart murmur   . Insomnia   . Neuropathy     HOSPITAL COURSE:  [name]  is a 63 y.o. female with a known history of left hip prosthesis, with Dr. Marry Guan, 2 years ago with 2 prior left hip dislocations since the prosthesis placement.  She presented to the emergency room via EMS services with acute left hip pain described as severe pain.  Patient reports she had twisted and reached down to pick up her cat and felt the hip pop out of place.   1. 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 Karma Greaser in the emergency room -discussed with Dr. Bess Harvest. Okay from orthopedic standpoint to discharge patient and follow-up in two weeks. Weight-bearing as tolerated. Patient will resume home health physical therapy at discharge.  -She is now sitting up in bed with left lower extremity immobilizer in place.  Patient denies pain or numbness or other paresthesias of her left  lower extremity. - Pain is being controlled  with analgesic -Fall precautions  2.  EtOH abuse - CIWA protocol initiated -no sign/ symptoms of DTs noted  3. depression - Psychiatric services have been consulted -- ok with Dr. Leverne Humbles okay to discharge home. Recommends prescription for Klonopin 0.5 mg BID. Continue Effexor xr 300 milligrams daily  4.  Neuropathy - Neurontin continued  Patient will discharged to home. She is agreeable. Home health will be resumed. Care management notified.  CONSULTS OBTAINED:  Treatment Team:  Lavella Hammock, MD Dereck Leep, MD  DRUG ALLERGIES:   Allergies  Allergen Reactions  . Benadryl [Diphenhydramine] Hives and Other (See Comments)    Reaction:  Hyperactivity   . Benadryl [Diphenhydramine] Other (See Comments)    My jaw locked and I could not speak, like a stroke.  . Cephalosporins Hives  . Codeine Nausea And Vomiting  . Codeine Nausea Only    severe    DISCHARGE MEDICATIONS:   Allergies as of 11/29/2018      Reactions   Benadryl [diphenhydramine] Hives, Other (See Comments)   Reaction:  Hyperactivity    Benadryl [diphenhydramine] Other (See Comments)   My jaw locked and I could not speak, like a stroke.   Cephalosporins Hives   Codeine Nausea And Vomiting   Codeine Nausea Only   severe      Medication List    TAKE these medications   calcium carbonate 1250 (500 Ca) MG tablet Commonly known as:  OS-CAL - dosed in mg of elemental calcium  Take 1 tablet (500 mg of elemental calcium total) by mouth daily with breakfast.   carbidopa-levodopa 25-100 MG tablet Commonly known as:  SINEMET IR Take 1 tablet by mouth 3 (three) times daily.   clonazePAM 0.5 MG tablet Commonly known as:  KLONOPIN Take 1 tablet (0.5 mg total) by mouth 2 (two) times daily as needed for anxiety.   famotidine 20 MG tablet Commonly known as:  PEPCID Take 20 mg by mouth 2 (two) times daily.   gabapentin 300 MG capsule Commonly known as:  NEURONTIN Take 3 capsules (900 mg total) by mouth 3  (three) times daily.   multivitamin with minerals Tabs tablet Take 1 tablet by mouth daily.   propranolol 10 MG tablet Commonly known as:  INDERAL Take 1 tablet (10 mg total) by mouth 3 (three) times daily.   traMADol 50 MG tablet Commonly known as:  ULTRAM Take 1 tablet (50 mg total) by mouth every 6 (six) hours as needed for moderate pain.   traZODone 150 MG tablet Commonly known as:  DESYREL Take 150 mg by mouth at bedtime.   venlafaxine XR 150 MG 24 hr capsule Commonly known as:  EFFEXOR-XR Take 2 capsules (300 mg total) by mouth daily with breakfast. What changed:  how much to take       If you experience worsening of your admission symptoms, develop shortness of breath, life threatening emergency, suicidal or homicidal thoughts you must seek medical attention immediately by calling 911 or calling your MD immediately  if symptoms less severe.  You Must read complete instructions/literature along with all the possible adverse reactions/side effects for all the Medicines you take and that have been prescribed to you. Take any new Medicines after you have completely understood and accept all the possible adverse reactions/side effects.   Please note  You were cared for by a hospitalist during your hospital stay. If you have any questions about your discharge medications or the care you received while you were in the hospital after you are discharged, you can call the unit and asked to speak with the hospitalist on call if the hospitalist that took care of you is not available. Once you are discharged, your primary care physician will handle any further medical issues. Please note that NO REFILLS for any discharge medications will be authorized once you are discharged, as it is imperative that you return to your primary care physician (or establish a relationship with a primary care physician if you do not have one) for your aftercare needs so that they can reassess your need for  medications and monitor your lab values. Today   SUBJECTIVE   Complains of left hip pain however doing much better than yesterday. She is wanting to go home.  VITAL SIGNS:  Blood pressure (!) 128/55, pulse 79, temperature (!) 97.4 F (36.3 C), resp. rate 16, height 5\' 5"  (1.651 m), weight 66.7 kg, SpO2 96 %.  I/O:    Intake/Output Summary (Last 24 hours) at 11/29/2018 1614 Last data filed at 11/29/2018 1253 Gross per 24 hour  Intake 452.73 ml  Output 1350 ml  Net -897.27 ml    PHYSICAL EXAMINATION:  GENERAL:  63 y.o.-year-old patient lying in the bed with no acute distress.  EYES: Pupils equal, round, reactive to light and accommodation. No scleral icterus. Extraocular muscles intact.  HEENT: Head atraumatic, normocephalic. Oropharynx and nasopharynx clear.  NECK:  Supple, no jugular venous distention. No thyroid enlargement, no tenderness.  LUNGS: Normal breath sounds bilaterally,  no wheezing, rales,rhonchi or crepitation. No use of accessory muscles of respiration.  CARDIOVASCULAR: S1, S2 normal. No murmurs, rubs, or gallops.  ABDOMEN: Soft, non-tender, non-distended. Bowel sounds present. No organomegaly or mass.  EXTREMITIES: No pedal edema, cyanosis, or clubbing. Left LE immobilizer NEUROLOGIC: Cranial nerves II through XII are intact. Muscle strength 5/5 in all extremities. Sensation intact. Gait not checked.  PSYCHIATRIC: The patient is alert and oriented x 3.  SKIN: No obvious rash, lesion, or ulcer.   DATA REVIEW:   CBC  Recent Labs  Lab 11/29/18 0502  WBC 9.5  HGB 13.0  HCT 36.9  PLT 284    Chemistries  Recent Labs  Lab 11/29/18 0041 11/29/18 0502  NA 125* 131*  K 2.7* 3.0*  CL 88* 98  CO2 25 24  GLUCOSE 67* 84  BUN 10 10  CREATININE 0.57 0.54  CALCIUM 8.3* 7.9*  MG 2.0  --   AST 15  --   ALT 7  --   ALKPHOS 69  --   BILITOT 0.5  --     Microbiology Results   Recent Results (from the past 240 hour(s))  SARS Coronavirus 2 (CEPHEID - Performed  in [facility] hospital lab), Hosp Order     Status: None   Collection Time: 11/29/18 12:41 AM  Result Value Ref Range Status   SARS Coronavirus 2 NEGATIVE NEGATIVE Final    Comment: (NOTE) If result is NEGATIVE SARS-CoV-2 target nucleic acids are NOT DETECTED. The SARS-CoV-2 RNA is generally detectable in upper and lower  respiratory specimens during the acute phase of infection. The lowest  concentration of SARS-CoV-2 viral copies this assay can detect is 250  copies / mL. A negative result does not preclude SARS-CoV-2 infection  and should not be used as the sole basis for treatment or other  patient management decisions.  A negative result may occur with  improper specimen collection / handling, submission of specimen other  than nasopharyngeal swab, presence of viral mutation(s) within the  areas targeted by this assay, and inadequate number of viral copies  (<250 copies / mL). A negative result must be combined with clinical  observations, patient history, and epidemiological information. If result is POSITIVE SARS-CoV-2 target nucleic acids are DETECTED. The SARS-CoV-2 RNA is generally detectable in upper and lower  respiratory specimens dur ing the acute phase of infection.  Positive  results are indicative of active infection with SARS-CoV-2.  Clinical  correlation with patient history and other diagnostic information is  necessary to determine patient infection status.  Positive results do  not rule out bacterial infection or co-infection with other viruses. If result is PRESUMPTIVE POSTIVE SARS-CoV-2 nucleic acids MAY BE PRESENT.   A presumptive positive result was obtained on the submitted specimen  and confirmed on repeat testing.  While 2019 novel coronavirus  (SARS-CoV-2) nucleic acids may be present in the submitted sample  additional confirmatory testing may be necessary for epidemiological  and / or clinical management purposes  to differentiate between  SARS-CoV-2  and other Sarbecovirus currently known to infect humans.  If clinically indicated additional testing with an alternate test  methodology [phone]) is advised. The SARS-CoV-2 RNA is generally  detectable in upper and lower respiratory sp ecimens during the acute  phase of infection. The expected result is Negative. Fact Sheet for Patients:  StrictlyIdeas.no Fact Sheet for Healthcare Providers: BankingDealers.co.za This test is not yet approved or cleared by the [place] FDA and has been authorized for detection  and/or diagnosis of SARS-CoV-2 by FDA under an Emergency Use Authorization (EUA).  This EUA will remain in effect (meaning this test can be used) for the duration of the COVID-19 declaration under Section 564(b)(1) of the Act, 21 U.S.C. section 360bbb-3(b)(1), unless the authorization is terminated or revoked sooner. Performed at [facility], [address] [place], [place] [address]     RADIOLOGY:  Dg Hip [place] Pelvis 1 View Left  Result Date: 11/29/2018 CLINICAL DATA:  63 year old female status post reduction of recurrent left hip arthroplasty dislocation. EXAM: DG HIP (WITH OR WITHOUT PELVIS) 1V PORT LEFT COMPARISON:  11/28/2018 and earlier. FINDINGS: Single AP view at 0116 hours. Resumed normal AP alignment of the left hip arthroplasty. Hardware appears intact without loosening. No acute osseous abnormality identified. IMPRESSION: Normal AP alignment suggesting reduction of the dislocated left hip arthroplasty. No acute osseous abnormality identified. Electronically Signed   By: Genevie Annica M.D.   On: 11/29/2018 01:40   Dg Hip Unilat With Pelvis 2-3 Views Left  Result Date: 11/28/2018 CLINICAL DATA:  63 year old female with left hip pain, suspected recurrent prosthetic dislocation. EXAM: DG HIP (WITH OR WITHOUT PELVIS) 2-3V LEFT COMPARISON:  08/06/2018 and earlier. FINDINGS: Recurrent superior and slightly  posterior dislocation of the bipolar left hip arthroplasty, very similar configuration to that on 08/06/2018. No hardware fracture identified. Normal AP alignment of the right hip arthroplasty. Osteopenia but no acute fracture identified. Chronic pelvic phleboliths. Negative visible bowel gas pattern. 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 Electronically Signed   By: Genevie Janelie M.D.   On: 11/28/2018 23:44     CODE STATUS:     Code Status Orders  (From admission, onward)         Start     Ordered   11/29/18 0223  Full code  Continuous     11/29/18 0225        Code Status History    Date Active Date Inactive Code Status Order ID Comments User Context   08/06/2018 1930 08/09/2018 1856 Full Code [id number]  Gorden Harms, MD Inpatient   05/11/2017 1604 05/21/2017 1521 Full Code [id number]  Gonzella Lex, MD Inpatient   05/11/2017 1604 05/11/2017 1604 Full Code [id number]  Gonzella Lex, MD Inpatient   05/08/2017 1615 05/11/2017 1532 Full Code [id number]  Loletha Grayer, MD ED   07/05/2016 0438 07/11/2016 2023 Full Code [id number]  Clapacs, [name], MD Inpatient   07/04/2015 1503 07/09/2015 1915 Full Code [id number]  Hildred Priest, MD Inpatient   07/03/2015 2358 07/04/2015 1503 Full Code [id number]  Gonzella Lex, MD Inpatient   06/30/2015 2018 07/03/2015 2358 Full Code [id number]  Loletha Grayer, MD ED   01/25/2015 1426 01/30/2015 2050 Full Code [id number]  Gonzella Lex, MD Inpatient   01/08/2015 1244 01/10/2015 1545 Full Code [id number]  Henreitta Leber, MD Inpatient   12/21/2013 1716 12/27/2013 1140 Full Code [id number]  Clarene Reamer, MD Inpatient   12/21/2013 1013 12/21/2013 1716 Full Code [id number]  Virgel Manifold, MD ED    Advance Directive Documentation     Most Recent Value  Type of Advance Directive  Living will  Pre-existing out of facility DNR order (yellow form or pink MOST form)  -  "MOST" Form in Place?  -      TOTAL  TIME TAKING CARE OF THIS PATIENT: *40* minutes.    Fritzi Mandes M.D on 11/29/2018 at 4:14 PM  Between  7am to 6pm - Pager - [phone] After 6pm go to [email or web address] - password [email or web address] [facility]  Office  [phone]  CC: Primary care physician; Dion Body, MD

## 2018-11-29 NOTE — Care Management CC44 (Signed)
Condition Code 44 Documentation Completed  Patient Details  Name: [name] MRN: [id number] Date of Birth: [birth year]   Condition Code 44 given:  Yes Patient signature on Condition Code 44 notice:  Yes Documentation of 2 MD's agreement:  Yes Code 44 added to claim:  Yes    Su Hilt, RN 11/29/2018, 4:31 PM

## 2018-11-29 NOTE — Evaluation (Signed)
Physical Therapy Evaluation Patient Details Name: [name] MRN: [id number] DOB: 02-03-1956 Today's Date: 11/29/2018   History of Present Illness  Pt admitted for L hip dislocation s/p reduction in ED. Ortho consult recieved and talked to MD verbally, WBAT and post hip precautions. History of L THR 2 years ago, anxiety, bipolar, ETOH, and depression.  Clinical Impression  Pt is a pleasant 63 year old female who was admitted for L hip dislocation, now reduced. Per MD, no SX this admission. Pt performs bed mobility with mod I, transfers with supervision, and ambulation with supervision and use of 1 loft stand crutch. Recommend use of hip spica brace due to frequency of hip dislocations, pt reports she has one at home, however needs assistance with donning. Continued education required for hip precautions as pt tends to flex hip and IR. Pt demonstrates deficits with strength/mobility. Would benefit from skilled PT to address above deficits and promote optimal return to PLOF. Recommend transition to [place] upon discharge from acute hospitalization.      Follow Up Recommendations Home health PT    Equipment Recommendations  None recommended by PT    Recommendations for Other Services       Precautions / Restrictions Precautions Precautions: Posterior Hip Precaution Booklet Issued: (already has) Restrictions Weight Bearing Restrictions: Yes LLE Weight Bearing: Weight bearing as tolerated      Mobility  Bed Mobility Overal bed mobility: Modified Independent             General bed mobility comments: safe technique. L KI taken off due to mobility restrictions, RN agreeable.  Transfers Overall transfer level: Needs assistance Equipment used: Lofstrands Transfers: Sit to/from Stand Sit to Stand: Supervision         General transfer comment: safe technique  Ambulation/Gait Ambulation/Gait assistance: Supervision Gait Distance (Feet): 150 Feet Assistive device:  Lofstrands Gait Pattern/deviations: Step-through pattern     General Gait Details: safe technique with reciprocal gait. No LOB noted. +2 for equipment  Stairs            Wheelchair Mobility    Modified Rankin (Stroke Patients Only)       Balance Overall balance assessment: Needs assistance Sitting-balance support: Feet supported Sitting balance-Leahy Scale: Good     Standing balance support: Single extremity supported Standing balance-Leahy Scale: Good                               Pertinent Vitals/Pain Pain Assessment: No/denies pain    Home Living Family/patient expects to be discharged to:: Private residence Living Arrangements: Alone Available Help at Discharge: Friend(s);Available PRN/intermittently Type of Home: Apartment Home Access: Stairs to enter Entrance Stairs-Rails: Can reach both Entrance Stairs-Number of Steps: 3 Home Layout: One level Home Equipment: Wheelchair - manual(loft strand crutches)      Prior Function Level of Independence: Independent with assistive device(s)         Comments: uses loft stand crutch recently, previously indep     Hand Dominance        Extremity/Trunk Assessment   Upper Extremity Assessment Upper Extremity Assessment: Overall WFL for tasks assessed    Lower Extremity Assessment Lower Extremity Assessment: Overall WFL for tasks assessed       Communication   Communication: No difficulties  Cognition Arousal/Alertness: Awake/alert Behavior During Therapy: WFL for tasks assessed/performed Overall Cognitive Status: Within Functional Limits for tasks assessed  General Comments      Exercises Other Exercises Other Exercises: Pt performed stair training with cga. Step to gait pattern noted. 4 stairs with 1 railing to simulate home environment. Cues for hip precautions.   Assessment/Plan    PT Assessment Patient needs continued PT  services  PT Problem List Decreased strength;Decreased balance;Decreased safety awareness;Decreased knowledge of precautions       PT Treatment Interventions Gait training;Therapeutic exercise;Balance training    PT Goals (Current goals can be found in the Care Plan section)  Acute Rehab PT Goals Patient Stated Goal: to go home PT Goal Formulation: With patient Time For Goal Achievement: 12/13/18 Potential to Achieve Goals: Good    Frequency Min 2X/week   Barriers to discharge        Co-evaluation               AM-PAC PT "6 Clicks" Mobility  Outcome Measure Help needed turning from your back to your side while in a flat bed without using bedrails?: None Help needed moving from lying on your back to sitting on the side of a flat bed without using bedrails?: None Help needed moving to and from a bed to a chair (including a wheelchair)?: A Little Help needed standing up from a chair using your arms (e.g., wheelchair or bedside chair)?: A Little Help needed to walk in hospital room?: A Little Help needed climbing 3-5 steps with a railing? : A Little 6 Click Score: 20    End of Session Equipment Utilized During Treatment: Gait belt Activity Tolerance: Patient tolerated treatment well Patient left: in bed;with bed alarm set Nurse Communication: Mobility status PT Visit Diagnosis: Muscle weakness (generalized) (M62.81);Difficulty in walking, not elsewhere classified (R26.2)    Time: 7096-2836 PT Time Calculation (min) (ACUTE ONLY): 28 min   Charges:   PT Evaluation $PT Eval Low Complexity: 1 Low PT Treatments $Gait Training: 8-22 mins        Greggory Stallion, PT, DPT [phone]   [name] 11/29/2018, 4:13 PM

## 2018-11-29 NOTE — ED Notes (Addendum)
Pt using call bell; says the purewick isn't working right and she has been incontinent of some urine; suction turned up and immediately more urine drained into cannister; pt said she could tell a difference already; chux changed and blankets smoothed out under pt; gingerale and cell phone back in reach;

## 2018-11-29 NOTE — Progress Notes (Signed)
Discharge summary reviewed with verbal understanding. Escorted to personal vehicle. 

## 2018-11-29 NOTE — Plan of Care (Signed)
  Problem: Education: Goal: Knowledge of [facility] Education information/materials will improve Outcome: Progressing Goal: Emotional status will improve Outcome: Progressing Goal: Mental status will improve Outcome: Progressing Goal: Verbalization of understanding the information provided will improve Outcome: Progressing   Problem: Activity: Goal: Interest or engagement in activities will improve Outcome: Progressing Goal: Sleeping patterns will improve Outcome: Progressing   Problem: Safety: Goal: Periods of time without injury will increase Outcome: Progressing

## 2018-11-29 NOTE — ED Notes (Signed)
Critical potassium of 2.7 called from lab. Dr. Karma Greaser notified, no new verbal orders received.

## 2018-11-29 NOTE — H&P (Signed)
[facility] NAME: [name]    MR#:  [id number]  DATE OF BIRTH:  [birth year]  DATE OF ADMISSION:  11/28/2018  PRIMARY CARE PHYSICIAN: Dion Body, MD   REQUESTING/REFERRING PHYSICIAN:Corey Karma Greaser, MD  CHIEF COMPLAINT:   Chief Complaint  Patient presents with  . Hip Injury    HISTORY OF PRESENT ILLNESS:  [name]  is a 63 y.o. female with a known history of left hip prosthesis, with Dr. Marry Guan, 2 years ago with 2 prior left hip dislocations since the prosthesis placement.  She presented to the emergency room via EMS services with acute left hip pain described as severe pain.  Patient reports she had twisted and reached down to pick up her cat and felt the hip pop out of place.  On arrival to the emergency room, patient was experiencing severe left hip pain with some numbness reported in her left lower extremity.  On arrival, left hip x-ray demonstrated superior and posterior dislocation of the bipolar left hip arthroplasty.  Currently she is status post left hip reduction with Dr. Karma Greaser.  She denies left hip pain at the time I am seeing her.  She is awake alert and oriented x4.  Patient reports having had 4-6 beers on yesterday.  She has a history of EtOH abuse.  She tells me she drinks at least 6 beers a day.  Her ethanol level on arrival is 130.  Urine drug screen is negative.  She denies chest pain, shortness of breath, abdominal pain.  She denies fevers, chills, nausea, vomiting, diarrhea.  She sees Dr. Marry Guan, outpatient, and reports Dr. Marry Guan told her she may require surgery if her hip pops out again.  Patient also has a history of depression.  During her visit with the emergency room physician, she discussed occasional suicidal ideation.  She once again admits to suicidal ideas of drug overdose.  Psychiatric evaluation has been requested by the ED physician.  She has been admitted to the hospitalist service for  further management  PAST MEDICAL HISTORY:   Past Medical History:  Diagnosis Date  . Anxiety   . Anxiety    panic anxiety disorder  . Arthritis   . Asthma    chronic asthmatic bronchitis  . Bipolar disorder ([place])   . Cancer [facility])    Desmoid tumor left forearm  . Depression   . ETOH abuse   . Heart murmur   . Insomnia   . Neuropathy     PAST SURGICAL HISTORY:   Past Surgical History:  Procedure Laterality Date  . ABDOMINAL HYSTERECTOMY    . ABDOMINAL HYSTERECTOMY  2007  . 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 ARM AMPUTATION AT SHOULDER Left   . CESAREAN SECTION    . [place]   x 2  . ESOPHAGOGASTRODUODENOSCOPY (EGD) WITH PROPOFOL N/A 04/27/2018   Procedure: ESOPHAGOGASTRODUODENOSCOPY (EGD) WITH PROPOFOL;  Surgeon: Toledo, Benay Pike, MD;  Location: [facility] ENDOSCOPY;  Service: Gastroenterology;  Laterality: N/A;  . JOINT REPLACEMENT Right 2012   hip  . LUMBAR LAMINECTOMY/DECOMPRESSION MICRODISCECTOMY Left 04/16/2016   Procedure: LUMBAR LAMINECTOMY/DECOMPRESSION MICRODISCECTOMY 1 LEVEL;  Surgeon: Meade Maw, MD;  Location: [facility] ORS;  Service: Neurosurgery;  Laterality: Left;  Left L4-5 far lateral discectomy, left L4-5 laminoforaminotomy  . TOTAL HIP ARTHROPLASTY Left 09/17/2015   Procedure: TOTAL HIP ARTHROPLASTY;  Surgeon: Dereck Leep, MD;  Location: [facility] ORS;  Service: Orthopedics;  Laterality: Left;    SOCIAL HISTORY:   Social History   Tobacco Use  . Smoking status: Current Every Day Smoker    Packs/day: 0.25    Types: Cigarettes  . Smokeless tobacco: Never Used  Substance Use Topics  . Alcohol use: Yes    Comment: 2 40oz beers daily    FAMILY HISTORY:   Family History  Problem Relation Age of Onset  . COPD Mother     DRUG ALLERGIES:   Allergies  Allergen Reactions  . Benadryl [Diphenhydramine] Hives and Other (See Comments)    Reaction:  Hyperactivity   . Benadryl [Diphenhydramine] Other (See  Comments)    My jaw locked and I could not speak, like a stroke.  . Cephalosporins Hives  . Codeine Nausea And Vomiting  . Codeine Nausea Only    severe    REVIEW OF SYSTEMS:   Review of Systems  Constitutional: Negative for chills, fever and malaise/fatigue.  HENT: Negative for congestion, sinus pain and sore throat.   Eyes: Negative for blurred vision, double vision and pain.  Respiratory: Negative for cough, sputum production, shortness of breath and wheezing.   Cardiovascular: Negative for chest pain, orthopnea, leg swelling and PND.  Gastrointestinal: Negative for abdominal pain, constipation, diarrhea, heartburn, melena, nausea and vomiting.  Genitourinary: Negative for dysuria, flank pain, hematuria and urgency.  Musculoskeletal: Positive for joint pain (Left hip pain). Negative for falls.  Skin: Negative.   Neurological: Negative for dizziness, seizures, loss of consciousness, weakness and headaches.  Psychiatric/Behavioral: Negative.     MEDICATIONS AT HOME:   Prior to Admission medications   Medication Sig Start Date End Date Taking? Authorizing Provider  carbidopa-levodopa (SINEMET IR) 25-100 MG tablet Take 1 tablet by mouth 3 (three) times daily.   Yes [provider]  clonazePAM (KLONOPIN) 0.5 MG tablet Take 0.5 mg by mouth 2 (two) times daily as needed for anxiety.   Yes [provider]  famotidine (PEPCID) 20 MG tablet Take 20 mg by mouth 2 (two) times daily.   Yes [provider]  gabapentin (NEURONTIN) 300 MG capsule Take 3 capsules (900 mg total) by mouth 3 (three) times daily. 05/21/17  Yes Pucilowska, [name], MD  Multiple Vitamin (MULTIVITAMIN WITH MINERALS) TABS tablet Take 1 tablet by mouth daily. 08/09/18  Yes Fritzi Mandes, MD  propranolol (INDERAL) 10 MG tablet Take 1 tablet (10 mg total) by mouth 3 (three) times daily. 05/21/17  Yes Pucilowska, [name], MD  traZODone (DESYREL) 150 MG tablet Take 150 mg by mouth at bedtime.     Yes [provider]  venlafaxine XR (EFFEXOR-XR) 150 MG 24 hr capsule Take 1 capsule (150 mg total) by mouth daily with breakfast. 05/21/17  Yes Pucilowska, [name], MD      VITAL SIGNS:  Blood pressure (!) 119/57, pulse 78, temperature 98.2 F (36.8 C), temperature source Oral, resp. rate 11, height 5\' 5"  (1.651 m), weight 66.7 kg, SpO2 97 %.  PHYSICAL EXAMINATION:  Physical Exam Constitutional:      General: She is not in acute distress.    Appearance: Normal appearance. She is not ill-appearing.  HENT:     Right Ear: External ear normal.     Left Ear: External ear normal.     Nose: Nose normal. No congestion.     Mouth/Throat:     Mouth: Mucous membranes are moist.     Pharynx: Oropharynx is clear.  Eyes:     Extraocular Movements: Extraocular  movements intact.     Conjunctiva/sclera: Conjunctivae normal.     Pupils: Pupils are equal, round, and reactive to light.  Neck:     Musculoskeletal: Normal range of motion and neck supple. No muscular tenderness.  Cardiovascular:     Rate and Rhythm: Normal rate and regular rhythm.     Pulses: Normal pulses.     Heart sounds: Normal heart sounds. No murmur. No friction rub. No gallop.   Pulmonary:     Effort: Pulmonary effort is normal. No respiratory distress.     Breath sounds: Normal breath sounds.  Abdominal:     General: Abdomen is flat. Bowel sounds are normal. There is no distension.     Palpations: Abdomen is soft.     Tenderness: There is no abdominal tenderness. There is no guarding or rebound.  Musculoskeletal:        General: No swelling or tenderness.     Right lower leg: No edema.     Left lower leg: No edema.     Comments: Left lower extremity immobilized s/p reduction  Skin:    General: Skin is warm and dry.     Capillary Refill: Capillary refill takes less than 2 seconds.     Findings: No rash.  Neurological:     General: No focal deficit present.     Mental Status: She is alert and oriented to  person, place, and time.     Motor: No weakness.  Psychiatric:        Mood and Affect: Mood normal.        Behavior: Behavior normal.      LABORATORY PANEL:   CBC Recent Labs  Lab 11/29/18 0041  WBC 9.7  HGB 13.6  HCT 37.8  PLT 288   ------------------------------------------------------------------------------------------------------------------  Chemistries  Recent Labs  Lab 11/29/18 0041  NA 125*  K 2.7*  CL 88*  CO2 25  GLUCOSE 67*  BUN 10  CREATININE 0.57  CALCIUM 8.3*  MG 2.0  AST 15  ALT 7  ALKPHOS 69  BILITOT 0.5   ------------------------------------------------------------------------------------------------------------------  Cardiac Enzymes No results for input(s): TROPONINI in the last 168 hours. ------------------------------------------------------------------------------------------------------------------  RADIOLOGY:  Dg Hip Port Unilat W Or Wo Pelvis 1 View Left  Result Date: 11/29/2018 CLINICAL DATA:  63 year old female status post reduction of recurrent left hip arthroplasty dislocation. EXAM: DG HIP (WITH OR WITHOUT PELVIS) 1V PORT LEFT COMPARISON:  11/28/2018 and earlier. FINDINGS: Single AP view at 0116 hours. Resumed normal AP alignment of the left hip arthroplasty. Hardware appears intact without loosening. No acute osseous abnormality identified. IMPRESSION: Normal AP alignment suggesting reduction of the dislocated left hip arthroplasty. No acute osseous abnormality identified. Electronically Signed   By: Genevie Suvi M.D.   On: 11/29/2018 01:40   Dg Hip Unilat With Pelvis 2-3 Views Left  Result Date: 11/28/2018 CLINICAL DATA:  63 year old female with left hip pain, suspected recurrent prosthetic dislocation. EXAM: DG HIP (WITH OR WITHOUT PELVIS) 2-3V LEFT COMPARISON:  08/06/2018 and earlier. FINDINGS: Recurrent superior and slightly posterior dislocation of the bipolar left hip arthroplasty, very similar configuration to that on 08/06/2018. No  hardware fracture identified. Normal AP alignment of the right hip arthroplasty. Osteopenia but no acute fracture identified. Chronic pelvic phleboliths. Negative visible bowel gas pattern. 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 Electronically Signed   By: Genevie Ambrosia M.D.   On: 11/28/2018 23:44      IMPRESSION AND PLAN:   1.  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 Karma Greaser in the emergency room - Dr. Posey Pronto has been consulted for orthopedic surgery with plans for notification of Dr. Marry Guan in the a.m. as patient sees Dr. Marry Guan outpatient. - She is now sitting up in bed with left lower extremity immobilizer in place.  Patient denies pain or numbness or other paresthesias of her left  lower extremity. - Pain is being controlled with analgesic -Physical therapy and Occupational Therapy consulted for supportive care -Fall precautions  2.  EtOH abuse - CIWA protocol initiated  3. depression - Psychiatric services have been consulted as patient admitted to occasional suicidal ideation of drug overdose. - Effexor continued  4.  Neuropathy - Neurontin continued - We will continue neurovascular evaluations with left hip reduction formed in the emergency room  DVT and PPI prophylaxis initiated    All the records are reviewed and case discussed with ED provider. The plan of care was discussed in details with the patient (and family). I answered all questions. The patient agreed to proceed with the above mentioned plan. Further management will depend upon hospital course.   CODE STATUS: Full code  TOTAL TIME TAKING CARE OF THIS PATIENT: 66minutes.    [place] on 11/29/2018 at 2:40 AM  Pager - [phone]  After 6pm go to [email or web address] - Proofreader  [facility]  Office  [phone]  CC: Primary care physician;  Dion Body, MD   Note: This dictation was prepared with Dragon dictation along with smaller phrase technology. Any transcriptional errors that result from this process are unintentional.

## 2018-11-29 NOTE — ED Notes (Signed)
Pt yelling in room; in to check on pt; warm blanket given as requested; pt says she may need her "Siminet" soon for her parkinsons tremors; Dr Karma Greaser informed of pt's comment, no new orders given

## 2018-11-29 NOTE — Progress Notes (Addendum)
   11/29/18 1100  Clinical Encounter Type  Visited With Patient  Visit Type Initial  Referral From Nurse   Chaplain received an OR to visit with the patient. Upon arrival, she was reclined in bed watching television. A sitter was present. Chaplain engaged in conversation with the patient about what brought her to the hospital and how she is doing. Patient shared her frustration about experiencing her third dislocation of her left hip since replacement; she's experienced no problems with the right hip. Chaplain provided support in the form of active and reflection listening, compassionate presence and prayer. The patient is insightful, self-aware and able to name/identify her feelings. She has a robust view of God which allows her to be in healthy, open communication about where she is in life. The patient expressed gratitude for the visit.

## 2018-12-31 ENCOUNTER — Emergency Department
Admission: EM | Admit: 2018-12-31 | Discharge: 2018-12-31 | Disposition: A | Discharge status: Home / Self Care | Payer: Medicare Other | Attending: Emergency Medicine | Admitting: Emergency Medicine

## 2018-12-31 ENCOUNTER — Emergency Department: Payer: Medicare Other

## 2018-12-31 ENCOUNTER — Other Ambulatory Visit: Payer: Self-pay

## 2018-12-31 ENCOUNTER — Encounter: Payer: Self-pay | Admitting: Emergency Medicine

## 2018-12-31 DIAGNOSIS — G20 Parkinson's disease: Secondary | ICD-10-CM | POA: Insufficient documentation

## 2018-12-31 DIAGNOSIS — Y939 Activity, unspecified: Secondary | ICD-10-CM | POA: Diagnosis not present

## 2018-12-31 DIAGNOSIS — F17210 Nicotine dependence, cigarettes, uncomplicated: Secondary | ICD-10-CM | POA: Insufficient documentation

## 2018-12-31 DIAGNOSIS — T84012A Broken internal right knee prosthesis, initial encounter: Secondary | ICD-10-CM | POA: Diagnosis not present

## 2018-12-31 DIAGNOSIS — Z96643 Presence of artificial hip joint, bilateral: Secondary | ICD-10-CM | POA: Diagnosis not present

## 2018-12-31 DIAGNOSIS — S79912A Unspecified injury of left hip, initial encounter: Secondary | ICD-10-CM | POA: Diagnosis present

## 2018-12-31 DIAGNOSIS — Y999 Unspecified external cause status: Secondary | ICD-10-CM | POA: Insufficient documentation

## 2018-12-31 DIAGNOSIS — Y929 Unspecified place or not applicable: Secondary | ICD-10-CM | POA: Insufficient documentation

## 2018-12-31 DIAGNOSIS — J45909 Unspecified asthma, uncomplicated: Secondary | ICD-10-CM | POA: Insufficient documentation

## 2018-12-31 DIAGNOSIS — R0902 Hypoxemia: Secondary | ICD-10-CM | POA: Diagnosis not present

## 2018-12-31 DIAGNOSIS — Z79899 Other long term (current) drug therapy: Secondary | ICD-10-CM | POA: Insufficient documentation

## 2018-12-31 DIAGNOSIS — S73005A Unspecified dislocation of left hip, initial encounter: Secondary | ICD-10-CM

## 2018-12-31 DIAGNOSIS — X58XXXA Exposure to other specified factors, initial encounter: Secondary | ICD-10-CM | POA: Diagnosis not present

## 2018-12-31 DIAGNOSIS — Y792 Prosthetic and other implants, materials and accessory orthopedic devices associated with adverse incidents: Secondary | ICD-10-CM | POA: Insufficient documentation

## 2018-12-31 LAB — PROTIME-INR
INR: 1 (ref 0.8–1.2)
Prothrombin Time: 13.3 seconds (ref 11.4–15.2)

## 2018-12-31 LAB — CBC WITH DIFFERENTIAL/PLATELET
Abs Immature Granulocytes: 0.19 10*3/uL — ABNORMAL HIGH (ref 0.00–0.07)
Basophils Absolute: 0.1 10*3/uL (ref 0.0–0.1)
Basophils Relative: 1 %
Eosinophils Absolute: 0.1 10*3/uL (ref 0.0–0.5)
Eosinophils Relative: 1 %
HCT: 42.1 % (ref 36.0–46.0)
Hemoglobin: 14.3 g/dL (ref 12.0–15.0)
Immature Granulocytes: 2 %
Lymphocytes Relative: 22 %
Lymphs Abs: 2.3 10*3/uL (ref 0.7–4.0)
MCH: 33.6 pg (ref 26.0–34.0)
MCHC: 34 g/dL (ref 30.0–36.0)
MCV: 99.1 fL (ref 80.0–100.0)
Monocytes Absolute: 0.8 10*3/uL (ref 0.1–1.0)
Monocytes Relative: 8 %
Neutro Abs: 7.3 10*3/uL (ref 1.7–7.7)
Neutrophils Relative %: 66 %
Platelets: 262 10*3/uL (ref 150–400)
RBC: 4.25 MIL/uL (ref 3.87–5.11)
RDW: 14 % (ref 11.5–15.5)
WBC: 10.8 10*3/uL — ABNORMAL HIGH (ref 4.0–10.5)
nRBC: 0 % (ref 0.0–0.2)

## 2018-12-31 LAB — COMPREHENSIVE METABOLIC PANEL
ALT: <5 U/L (ref 0–44)
AST: 17 U/L (ref 15–41)
Albumin: 4 g/dL (ref 3.5–5.0)
Alkaline Phosphatase: 53 U/L (ref 38–126)
Anion gap: 11 (ref 5–15)
BUN: 9 mg/dL (ref 8–23)
CO2: 26 mmol/L (ref 22–32)
Calcium: 9.3 mg/dL (ref 8.9–10.3)
Chloride: 98 mmol/L (ref 98–111)
Creatinine, Ser: 0.75 mg/dL (ref 0.44–1.00)
GFR calc Af Amer: >60 mL/min (ref >60)
GFR calc non Af Amer: >60 mL/min (ref >60)
Glucose, Bld: 105 mg/dL — ABNORMAL HIGH (ref 70–99)
Potassium: 3.1 mmol/L — ABNORMAL LOW (ref 3.5–5.1)
Sodium: 135 mmol/L (ref 135–145)
Total Bilirubin: 0.8 mg/dL (ref 0.3–1.2)
Total Protein: 6.7 g/dL (ref 6.5–8.1)

## 2018-12-31 LAB — ETHANOL: Alcohol, Ethyl (B): <10 mg/dL (ref <10)

## 2018-12-31 LAB — TYPE AND SCREEN
ABO/RH(D): A NEG
Antibody Screen: NEGATIVE

## 2018-12-31 MED ORDER — ONDANSETRON HCL 4 MG/2ML IJ SOLN
4.0000 mg | Freq: Once | INTRAMUSCULAR | Status: AC
  Administered 2018-12-31: 4 mg via INTRAVENOUS
  Filled 2018-12-31: qty 2

## 2018-12-31 MED ORDER — PROPOFOL 10 MG/ML IV BOLUS
INTRAVENOUS | Status: AC | PRN
  Administered 2018-12-31: 30 mg via INTRAVENOUS

## 2018-12-31 MED ORDER — CLONAZEPAM 0.5 MG PO TABS: 0.5000 mg | ORAL_TABLET | Freq: Two times a day (BID) | ORAL | 0 refills | Status: DC | PRN

## 2018-12-31 MED ORDER — MORPHINE SULFATE (PF) 2 MG/ML IV SOLN
INTRAVENOUS | Status: AC
  Administered 2018-12-31: 4 mg via INTRAVENOUS
  Filled 2018-12-31: qty 2

## 2018-12-31 MED ORDER — PROPOFOL 10 MG/ML IV BOLUS
INTRAVENOUS | Status: AC
  Filled 2018-12-31: qty 20

## 2018-12-31 MED ORDER — LORAZEPAM 2 MG/ML IJ SOLN
0.5000 mg | Freq: Once | INTRAMUSCULAR | Status: AC | PRN
  Administered 2018-12-31: 13:00:00 0.5 mg via INTRAVENOUS
  Filled 2018-12-31: qty 1

## 2018-12-31 MED ORDER — PROPOFOL 10 MG/ML IV BOLUS: 30.0000 mg | Freq: Once | INTRAVENOUS | Status: DC

## 2018-12-31 MED ORDER — MORPHINE SULFATE (PF) 4 MG/ML IV SOLN: 4.0000 mg | INTRAVENOUS | Status: DC | PRN

## 2018-12-31 NOTE — Consult Note (Signed)
ORTHOPAEDIC CONSULTATION  REQUESTING PHYSICIAN: Dr. Lavonia Drafts  Chief Complaint:   L hip pain  History of Present Illness: [name] is a 63 y.o. female who had a prior L THA performed by Dr. Marry Guan in June 2017. Since that time, she has had multiple dislocations. She was recently evaluated by Vance Peper, PA and there had been discussion to have revision surgery to constrained liner. However, the patient states that she could not get her surgery soon enough with Dr. Marry Guan so she has an appointment with Dr. Rudene Christians for this upcoming Monday, 01/03/19.   However, earlier today she sustained a repeat L hip dislocation. She states that this was due to her Parkinson's tremors being not well controlled on a new medication.  The patient noted immediate hip pain and inability to ambulate. Pain is described as sharp at its worst and a dull ache at its best.  Pain is rated a 10 out of 10 in severity.  Pain is improved with rest and immobilization.  Pain is worse with any sort of movement.  X-rays in the emergency department show a left hip periprosthetic dislocation.  Past Medical History:  Diagnosis Date  . Anxiety   . Anxiety    panic anxiety disorder  . Arthritis   . Asthma    chronic asthmatic bronchitis  . Bipolar disorder ([place])   . Cancer [facility])    Desmoid tumor left forearm  . Depression   . ETOH abuse   . Heart murmur   . Insomnia   . Neuropathy    Past Surgical History:  Procedure Laterality Date  . ABDOMINAL HYSTERECTOMY    . ABDOMINAL HYSTERECTOMY  2007  . 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 ARM AMPUTATION AT SHOULDER Left   . CESAREAN SECTION    . [place]   x 2  . ESOPHAGOGASTRODUODENOSCOPY (EGD) WITH PROPOFOL N/A 04/27/2018   Procedure: ESOPHAGOGASTRODUODENOSCOPY (EGD) WITH PROPOFOL;  Surgeon: Toledo, Benay Pike, MD;  Location: [facility] ENDOSCOPY;  Service:  Gastroenterology;  Laterality: N/A;  . JOINT REPLACEMENT Right 2012   hip  . LUMBAR LAMINECTOMY/DECOMPRESSION MICRODISCECTOMY Left 04/16/2016   Procedure: LUMBAR LAMINECTOMY/DECOMPRESSION MICRODISCECTOMY 1 LEVEL;  Surgeon: Meade Maw, MD;  Location: [facility] ORS;  Service: Neurosurgery;  Laterality: Left;  Left L4-5 far lateral discectomy, left L4-5 laminoforaminotomy  . TOTAL HIP ARTHROPLASTY Left 09/17/2015   Procedure: TOTAL HIP ARTHROPLASTY;  Surgeon: Dereck Leep, MD;  Location: [facility] ORS;  Service: Orthopedics;  Laterality: Left;   Social History   Socioeconomic History  . Marital status: Married    Spouse name: Not on file  . Number of children: Not on file  . Years of education: Not on file  . Highest education level: Not on file  Occupational History  . Not on file  Social Needs  . Financial resource strain: Not on file  . Food insecurity    Worry: Not on file    Inability: Not on file  . Transportation needs    Medical: Not on file    Non-medical: Not on file  Tobacco Use  . Smoking status: Current Every Day Smoker    Packs/day: 0.25    Types: Cigarettes  . Smokeless tobacco: Never Used  Substance and Sexual Activity  . Alcohol use: Yes    Comment: 2 40oz beers daily  . Drug use: No    Comment: Patient denies   . Sexual activity: Yes  Lifestyle  .  Physical activity    Days per week: Not on file    Minutes per session: Not on file  . Stress: Not on file  Relationships  . Social Herbalist on phone: Not on file    Gets together: Not on file    Attends religious service: Not on file    Active member of club or organization: Not on file    Attends meetings of clubs or organizations: Not on file    Relationship status: Not on file  Other Topics Concern  . Not on file  Social History Narrative   ** Merged History Encounter **       Family History  Problem Relation Age of Onset  . COPD Mother    Allergies  Allergen Reactions  . Benadryl  [Diphenhydramine] Hives and Other (See Comments)    Reaction:  Hyperactivity   . Benadryl [Diphenhydramine] Other (See Comments)    My jaw locked and I could not speak, like a stroke.  . Cephalosporins Hives  . Codeine Nausea And Vomiting  . Codeine Nausea Only    severe   Prior to Admission medications   Medication Sig Start Date End Date Taking? Authorizing Provider  calcium carbonate (OS-CAL - DOSED IN MG OF ELEMENTAL CALCIUM) 1250 (500 Ca) MG tablet Take 1 tablet (500 mg of elemental calcium total) by mouth daily with breakfast. 11/29/18   Fritzi Mandes, MD  carbidopa-levodopa (SINEMET IR) 25-100 MG tablet Take 1 tablet by mouth 3 (three) times daily.    [provider]  clonazePAM (KLONOPIN) 0.5 MG tablet Take 1 tablet (0.5 mg total) by mouth 2 (two) times daily as needed for anxiety. 12/31/18   Carrie Mew, MD  famotidine (PEPCID) 20 MG tablet Take 20 mg by mouth 2 (two) times daily.    [provider]  gabapentin (NEURONTIN) 300 MG capsule Take 3 capsules (900 mg total) by mouth 3 (three) times daily. 05/21/17   Pucilowska, Wardell Honour, MD  Multiple Vitamin (MULTIVITAMIN WITH MINERALS) TABS tablet Take 1 tablet by mouth daily. 08/09/18   Fritzi Mandes, MD  propranolol (INDERAL) 10 MG tablet Take 1 tablet (10 mg total) by mouth 3 (three) times daily. 05/21/17   Pucilowska, Wardell Honour, MD  traMADol (ULTRAM) 50 MG tablet Take 1 tablet (50 mg total) by mouth every 6 (six) hours as needed for moderate pain. 11/29/18   Fritzi Mandes, MD  traZODone (DESYREL) 150 MG tablet Take 150 mg by mouth at bedtime.     [provider]  venlafaxine XR (EFFEXOR-XR) 150 MG 24 hr capsule Take 2 capsules (300 mg total) by mouth daily with breakfast. 11/29/18   Fritzi Mandes, MD   Recent Labs    12/31/18 1234  WBC 10.8*  HGB 14.3  HCT 42.1  PLT 262  K 3.1*  CL 98  CO2 26  BUN 9  CREATININE 0.75  GLUCOSE 105*  CALCIUM 9.3  INR 1.0   Dg Pelvis 1-2 Views  Result Date:  12/31/2018 CLINICAL DATA:  Status post relocation of left hip. EXAM: PELVIS - 1-2 VIEW COMPARISON:  December 31, 2018 FINDINGS: The left hip dislocation has been reduced based on a single frontal view. IMPRESSION: Reduction of left hip dislocation. Electronically Signed   By: Dorise Bullion III M.D   On: 12/31/2018 14:23   Dg Chest Port 1 View  Result Date: 12/31/2018 CLINICAL DATA:  Hypoxia. EXAM: PORTABLE CHEST 1 VIEW COMPARISON:  None. FINDINGS: The heart size and mediastinal  contours are within normal limits. Both lungs are clear. No pneumothorax or pleural effusion is noted. The visualized skeletal structures are unremarkable. IMPRESSION: No active disease. Electronically Signed   By: Marijo Conception M.D.   On: 12/31/2018 13:17   Dg Hip Unilat With Pelvis 2-3 Views Left  Result Date: 12/31/2018 CLINICAL DATA:  Recurrent left hip dislocation. EXAM: DG HIP (WITH OR WITHOUT PELVIS) 2-3V LEFT COMPARISON:  Radiograph of November 29, 2018. FINDINGS: Posterior dislocation of left femoral prosthesis relative to acetabulum is noted. No fracture is noted. Right hip prosthesis appears normal. IMPRESSION: Posterior dislocation of left femoral prosthesis relative to left acetabular prosthesis. Electronically Signed   By: Marijo Conception M.D.   On: 12/31/2018 13:14     Positive ROS: All other systems have been reviewed and were otherwise negative with the exception of those mentioned in the HPI and as above.  Physical Exam: BP 135/69   Pulse (!) 58   Temp 98 F (36.7 C) (Oral)   Resp 15   SpO2 96%  General:  Alert, no acute distress Psychiatric:  Patient is competent for consent with normal mood and affect   Cardiovascular:  No pedal edema, regular rate and rhythm Respiratory:  No wheezing, non-labored breathing GI:  Abdomen is soft and non-tender Skin:  No lesions in the area of chief complaint, no erythema Neurologic:  Sensation intact distally, CN grossly intact Lymphatic:  No axillary or cervical  lymphadenopathy  Orthopedic Exam:  LLE: + DF/PF/EHL SILT grossly over foot Foot wwp +Log roll/axial load   X-rays:  As above: L periprosthetic hip dislocation; prior R THA in place  Assessment/Plan: BETTYJO LUNDBLAD is a 63 y.o. female with a L hip periprosthetic dislocation. This is now her 4th dislocation.  1. We discussed the treatment options. Patient would like to proceed with closed reduction of L hip in the ED. Please see procedure note below for full details.   2. Maintain knee immobilizer to avoid recurrent hip dislocation until follow up appointment. Patient can ambulate as tolerated.   3. I have discussed her case with Dr. Marry Guan who will plan to have the patient follow back up with him in the upcoming week.     Procedure Note - Closed Reduction of Hip: The patient concurred with the proposed plan, giving informed consent. The site of the procedure was properly noted/marked.  A Time Out was held and the patient identity, procedure, and laterality was confirmed. After administration of adequate anesthesia, reduction maneuver consisting of hip flexion, adduction, and rotation with traction was performed. A palpable clunk was felt as the hip reduced. X-rays were obtained to confirm reduction.  Clinical exam showed symmetric leg lengths and appropriate range of motion of the hip.  Neurovascular exam post reduction was grossly intact.  A knee immobilizer was placed. The leg was kept in an abducted position with pillows in between the legs. The patient was awakened from anesthesia without any further complication.        Leim Fabry   12/31/2018 5:06 PM

## 2018-12-31 NOTE — ED Notes (Signed)
Pt O2 sat decreased to 86% on room air with good wave form. Pt was placed on 2 liters O2 via [place] with return in O2 sats to 96-98%.

## 2018-12-31 NOTE — ED Provider Notes (Signed)
[facility] Emergency Department Provider Note   ____________________________________________    I have reviewed the triage vital signs and the nursing notes.   HISTORY  Chief Complaint Dislocation and Tremors     HPI [name] is a 63 y.o. female who presents with complaints of left hip pain, she states that she thinks she dislocated her left hip, apparently this is the fourth time this is happened.  She blames this on the tremor/spasm that she has had since being on new medications for her Parkinson's disease.  She complains of significant pain in the left hip.  She does arrive via EMS.  She is unable to move her left leg  Past Medical History:  Diagnosis Date  . Anxiety   . Anxiety    panic anxiety disorder  . Arthritis   . Asthma    chronic asthmatic bronchitis  . Bipolar disorder ([place])   . Cancer [facility])    Desmoid tumor left forearm  . Depression   . ETOH abuse   . Heart murmur   . Insomnia   . Neuropathy     Patient Active Problem List   Diagnosis Date Noted  . Displacement of internal left hip prosthesis ([place]) 11/29/2018  . Hip dislocation, left ([place]) 11/29/2018  . Hip dislocation, left, sequela 08/06/2018  . High anion gap metabolic acidosis 38/93/7342  . Overdose of antidepressant 07/03/2016  . S/P total hip arthroplasty 09/17/2015  . Bipolar I disorder, most recent episode mixed, severe with psychotic features ([place]) 07/03/2015  . Alcohol use disorder, severe, dependence ([place]) 01/26/2015  . Tobacco use disorder 01/26/2015  . Alcohol withdrawal ([place]) 01/08/2015  . 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 GAD (generalized anxiety disorder) 12/24/2013  . PTSD (post-traumatic stress disorder) 12/22/2013    Past Surgical History:  Procedure Laterality Date  . ABDOMINAL HYSTERECTOMY    . ABDOMINAL HYSTERECTOMY  2007  . 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 ARM AMPUTATION AT  SHOULDER Left   . CESAREAN SECTION    . [place]   x 2  . ESOPHAGOGASTRODUODENOSCOPY (EGD) WITH PROPOFOL N/A 04/27/2018   Procedure: ESOPHAGOGASTRODUODENOSCOPY (EGD) WITH PROPOFOL;  Surgeon: Toledo, Benay Pike, MD;  Location: [facility] ENDOSCOPY;  Service: Gastroenterology;  Laterality: N/A;  . JOINT REPLACEMENT Right 2012   hip  . LUMBAR LAMINECTOMY/DECOMPRESSION MICRODISCECTOMY Left 04/16/2016   Procedure: LUMBAR LAMINECTOMY/DECOMPRESSION MICRODISCECTOMY 1 LEVEL;  Surgeon: Meade Maw, MD;  Location: [facility] ORS;  Service: Neurosurgery;  Laterality: Left;  Left L4-5 far lateral discectomy, left L4-5 laminoforaminotomy  . TOTAL HIP ARTHROPLASTY Left 09/17/2015   Procedure: TOTAL HIP ARTHROPLASTY;  Surgeon: Dereck Leep, MD;  Location: [facility] ORS;  Service: Orthopedics;  Laterality: Left;    Prior to Admission medications   Medication Sig Start Date End Date Taking? Authorizing Provider  calcium carbonate (OS-CAL - DOSED IN MG OF ELEMENTAL CALCIUM) 1250 (500 Ca) MG tablet Take 1 tablet (500 mg of elemental calcium total) by mouth daily with breakfast. 11/29/18   Fritzi Mandes, MD  carbidopa-levodopa (SINEMET IR) 25-100 MG tablet Take 1 tablet by mouth 3 (three) times daily.    [provider]  clonazePAM (KLONOPIN) 0.5 MG tablet Take 1 tablet (0.5 mg total) by mouth 2 (two) times daily as needed for anxiety. 11/29/18   Fritzi Mandes, MD  famotidine (PEPCID) 20 MG tablet Take 20 mg by mouth 2 (two) times daily.  [provider]  gabapentin (NEURONTIN) 300 MG capsule Take 3 capsules (900 mg total) by mouth 3 (three) times daily. 05/21/17   Pucilowska, Wardell Honour, MD  Multiple Vitamin (MULTIVITAMIN WITH MINERALS) TABS tablet Take 1 tablet by mouth daily. 08/09/18   Fritzi Mandes, MD  propranolol (INDERAL) 10 MG tablet Take 1 tablet (10 mg total) by mouth 3 (three) times daily. 05/21/17   Pucilowska, Wardell Honour, MD  traMADol (ULTRAM) 50 MG tablet Take 1 tablet (50 mg total)  by mouth every 6 (six) hours as needed for moderate pain. 11/29/18   Fritzi Mandes, MD  traZODone (DESYREL) 150 MG tablet Take 150 mg by mouth at bedtime.     [provider]  venlafaxine XR (EFFEXOR-XR) 150 MG 24 hr capsule Take 2 capsules (300 mg total) by mouth daily with breakfast. 11/29/18   Fritzi Mandes, MD     Allergies Benadryl [diphenhydramine], Benadryl [diphenhydramine], Cephalosporins, Codeine, and Codeine  Family History  Problem Relation Age of Onset  . COPD Mother     Social History Social History   Tobacco Use  . Smoking status: Current Every Day Smoker    Packs/day: 0.25    Types: Cigarettes  . Smokeless tobacco: Never Used  Substance Use Topics  . Alcohol use: Yes    Comment: 2 40oz beers daily  . Drug use: No    Comment: Patient denies     Review of Systems  Constitutional: No fevers, complains of tremors, intermittent Eyes: No visual changes.  ENT: No sore throat. Cardiovascular: Denies chest pain. Respiratory: Denies shortness of breath. Gastrointestinal: No abdominal pain.  No nausea, no vomiting.   Genitourinary: Negative for dysuria. Musculoskeletal: As above Skin: Negative for rash. Neurological: Negative for headaches    ____________________________________________   PHYSICAL EXAM:  VITAL SIGNS: ED Triage Vitals  Enc Vitals Group     BP 12/31/18 1156 (!) 148/89     Pulse Rate 12/31/18 1156 88     Resp 12/31/18 1156 18     Temp 12/31/18 1359 98 F (36.7 C)     Temp Source 12/31/18 1359 Oral     SpO2 12/31/18 1156 99 %     Weight --      Height --      Head Circumference --      Peak Flow --      Pain Score --      Pain Loc --      Pain Edu? --      Excl. in [place]? --     Constitutional: Alert and oriented. No acute distress.  Highly anxious Eyes: Conjunctivae are normal.  Head: Atraumatic. Nose: No congestion/rhinnorhea. Mouth/Throat: Mucous membranes are moist.    Cardiovascular: Normal rate, regular rhythm. Grossly  normal heart sounds.  Good peripheral circulation. Respiratory: Normal respiratory effort.  No retractions. Lungs CTAB. Gastrointestinal: Soft and nontender. No distention.  No CVA tenderness. Genitourinary: deferred Musculoskeletal: Left leg is shortened and adducted, 2+ distal pulses, normal sensation Neurologic:  Normal speech and language. No gross focal neurologic deficits are appreciated.  Skin:  Skin is warm, dry and intact. No rash noted. Psychiatric: Mood and affect are normal. Speech and behavior are normal.  Anxious as above  ____________________________________________   LABS (all labs ordered are listed, but only abnormal results are displayed)  Labs Reviewed  CBC WITH DIFFERENTIAL/PLATELET - Abnormal; Notable for the following components:      Result Value   WBC 10.8 (*)    Abs Immature Granulocytes  0.19 (*)    All other components within normal limits  COMPREHENSIVE METABOLIC PANEL - Abnormal; Notable for the following components:   Potassium 3.1 (*)    Glucose, Bld 105 (*)    All other components within normal limits  SARS CORONAVIRUS 2 (HOSPITAL ORDER, [facility])  PROTIME-INR  ETHANOL  TYPE AND SCREEN   ____________________________________________  EKG  None ____________________________________________  RADIOLOGY  Chest x-ray unremarkable, hip x-ray demonstrates posterior dislocation of left femoral prosthesis ____________________________________________   PROCEDURES  Procedure(s) performed: yes  .Sedation  Date/Time: 12/31/2018 2:57 PM Performed by: Lavonia Drafts, MD Authorized by: Lavonia Drafts, MD   Consent:    Consent obtained:  Written and verbal   Consent given by:  Patient   Risks discussed:  Prolonged hypoxia resulting in organ damage, prolonged sedation necessitating reversal, respiratory compromise necessitating ventilatory assistance and intubation, vomiting, nausea and inadequate sedation   Alternatives  discussed:  Analgesia without sedation Universal protocol:    Procedure explained and questions answered to patient or proxy's satisfaction: yes     Relevant documents present and verified: yes     Test results available and properly labeled: yes     Imaging studies available: yes     Required blood products, implants, devices, and special equipment available: yes     Site/side marked: yes     Immediately prior to procedure a time out was called: yes   Indications:    Procedure performed:  Dislocation reduction   Procedure necessitating sedation performed by:  Different physician Pre-sedation assessment:    Time since last food or drink:  12 hours   ASA classification: class 3 - patient with severe systemic disease     Neck mobility: normal     Mouth opening:  2 finger widths   Mallampati score:  II - soft palate, uvula, fauces visible   Pre-sedation assessments completed and reviewed: airway patency, cardiovascular function, hydration status, mental status, nausea/vomiting, respiratory function and temperature     Pre-sedation assessment completed:  12/31/2018 2:00 PM Immediate pre-procedure details:    Reassessment: Patient reassessed immediately prior to procedure     Reviewed: vital signs     Verified: bag valve mask available, intubation equipment available, IV patency confirmed, oxygen available and suction available   Procedure details (see MAR for exact dosages):    Preoxygenation:  Room air   Sedation:  Propofol   Intra-procedure monitoring:  Blood pressure monitoring, continuous pulse oximetry, frequent vital sign checks, cardiac monitor and continuous capnometry   Intra-procedure events: none     Total Provider sedation time (minutes):  10 Post-procedure details:    Post-sedation assessment completed:  12/31/2018 2:20 PM   Attendance: Constant attendance by certified staff until patient recovered     Recovery: Patient returned to pre-procedure baseline     Post-sedation  assessments completed and reviewed: cardiovascular function, mental status, nausea/vomiting, pain level and respiratory function     Patient is stable for discharge or admission: yes     Patient tolerance:  Tolerated well, no immediate complications     Critical Care performed: No ____________________________________________   INITIAL IMPRESSION / ASSESSMENT AND PLAN / ED COURSE  Pertinent labs & imaging results that were available during my care of the patient were reviewed by me and considered in my medical decision making (see chart for details).  Patient presents with dislocation of the left hip, this is her fourth time doing it, she is discussing with orthopedics whether to  have a revision/surgery performed.  N.p.o. x12 hours.  I discussed with Dr. Posey Pronto of orthopedics who will come reduce her hip, I will perform a sedation. ----------------------------------------- 2:30 PM on 12/31/2018 -----------------------------------------   Successful reduction performed, sedation was tolerated well.  Weightbearing as tolerated, knee immobilizer per Dr. Posey Pronto       ____________________________________________   FINAL CLINICAL IMPRESSION(S) / ED DIAGNOSES  Final diagnoses:  Dislocation of left hip, initial encounter [facility])        Note:  This document was prepared using Dragon voice recognition software and may include unintentional dictation errors.   Lavonia Drafts, MD 12/31/18 [phone]

## 2018-12-31 NOTE — ED Notes (Signed)
Pt reports that she last ate around 7 pm last night

## 2018-12-31 NOTE — ED Provider Notes (Signed)
-----------------------------------------   3:50 PM on 12/31/2018 -----------------------------------------  Dr. Kyra Manges klonopin rx inadvertently was routed to wrong pharmacy. I have resubmitted tot he correct pharmacy, and we are calling the incorrect pharmacy to cancel their prescription.    Carrie Mew, MD 12/31/18 1550

## 2018-12-31 NOTE — ED Notes (Signed)
Pt informed that RN needed to COVID swab her incase she needed to be admitted or have surgery. Pt states that she cannot tolerate having this done because it will send her into a "panic attack". Pt reports that last time she was here and had to have a swab that they waited until she was sedated to do the swab.

## 2018-12-31 NOTE — ED Notes (Signed)
Pt urinated on self after purewick lost placement. Peri care provided. Pt assisted into own clothing.

## 2018-12-31 NOTE — TOC Transition Note (Signed)
Transition of Care [facility]) - CM/SW Discharge Note   Patient Details  Name: [name] MRN: [id number] Date of Birth: Aug 23, 1955  Transition of Care [facility]) CM/SW Contact:  Marshell Garfinkel, RN Phone Number: 12/31/2018, 4:03 PM   Clinical Narrative:    RNCM met with patient. She is from home where she receives personal care services every morning. She would like to use [facility] home health. States she has a follow up appointment with Dr. Marry Guan and Dr. Rudene Christians. I spoke with [name] ortho and updated patient her appointment is with Dr. Marry Guan on 01/04/19 at 315P.  Referral to Cassie with [facility].    Final next level of care: [place] Barriers to Discharge: No Barriers Identified   Patient Goals and CMS Choice Patient states their goals for this hospitalization and ongoing recovery are:: "return to home" CMS Medicare.gov Compare Post Acute Care list provided to:: Patient Choice offered to / list presented to : Patient  Discharge Placement                       Discharge Plan and Services     Post Acute Care Choice: Home Health                    HH Arranged: PT, Social Work [name] Agency: [facility] [place] Date [place]: 12/31/18 Time [place]: 1602 Representative spoke with at [place]: [place] ([place]) Interventions     Readmission Risk Interventions No flowsheet data found.

## 2018-12-31 NOTE — Sedation Documentation (Signed)
Patient alert and talking with RN. Pt asking about her cell phone and being able to eat. Pt informed that her cell phone is plugged up and charging and that once she is more awake we will let her have something to eat. Pt reports that the pain in her hip is better.

## 2018-12-31 NOTE — ED Notes (Signed)
Patient having episodes of desaturation. Pt placed on 2 liters of O2 via [place]. Dr. Corky Downs aware.

## 2018-12-31 NOTE — ED Notes (Signed)
Pt back from x-ray.

## 2018-12-31 NOTE — ED Notes (Signed)
[facility] called and informed that pt will need EMS transport back home. Medical necessity printed

## 2018-12-31 NOTE — ED Notes (Signed)
Patient transported to X-ray 

## 2018-12-31 NOTE — ED Triage Notes (Signed)
Pt to ED via [facility] from home for possible left hip dislocation. Pt states that she has hx/o Parkinson's disease and over the past 3 weeks her she has had episodes of "jerking". Pt states that this morning she lost control of her bladder and her aid was cleaning her up. Pt reports that she started jerking and jerked to the left and dislocated her hip. Pt states that this has happened before in the past.  Pt reports that she has been in contact with her neurologist Dr. Manuella Ghazi about worsening parkinson's symptoms, pt reports to get "jerks" to stop last night she had to take 6 150 mg Trazodone tablets and 4 0.5 mg klonopin tablets to get the jerking to stop last night. Pt was giving 200 mcg of Fentanyl by EMS

## 2018-12-31 NOTE — ED Notes (Signed)
Pt requesting "klonopin prescription to ease parkinson's s/s until appointments Monday/tuesday." EDP [place] notified.

## 2019-01-06 DIAGNOSIS — G20 Parkinson's disease: Secondary | ICD-10-CM | POA: Insufficient documentation

## 2019-01-20 IMAGING — MR MR HEAD W/O CM
10 of 11 series · 40 of 48 positions shown · non-contrast
Comparison: CT HEAD April 07, 2018

CLINICAL DATA: Recurrent dizziness and falls. History of
Parkinson's disease and alcohol abuse.

EXAM:
MRI HEAD WITHOUT CONTRAST
TECHNIQUE: Multiplanar, multiecho pulse sequences of the brain and surrounding
structures were obtained without intravenous contrast.

[Series 5: ax dwi_tracew · axial · 3.0mm · 0.60mm/px · z∈[-68,+94]mm · 5 of 55 slices shown]
[im 1/55]
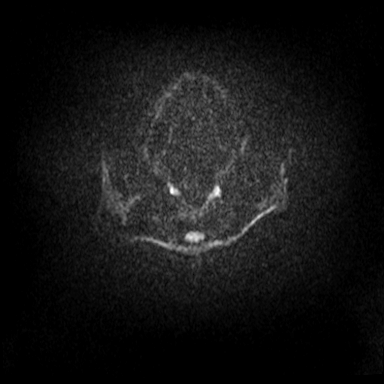
[im 14/55]
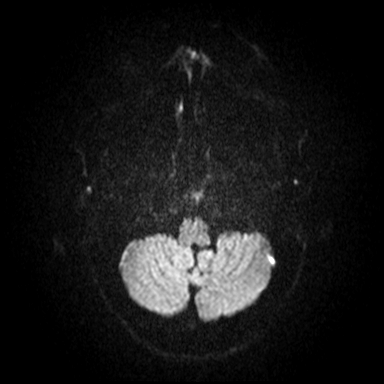
[im 28/55]
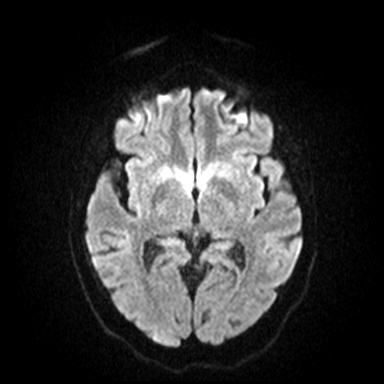
[im 41/55]
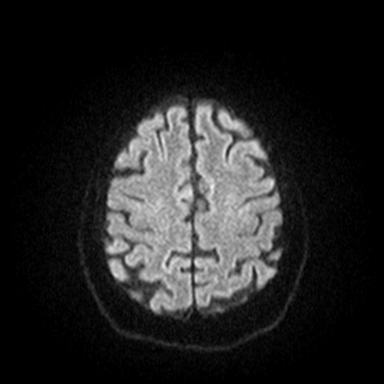
[im 55/55]
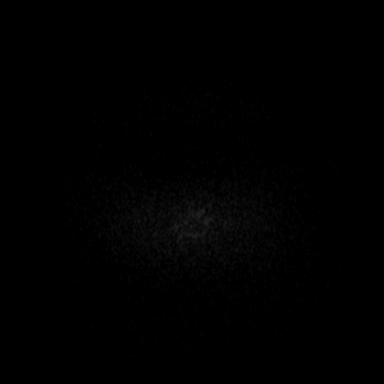

[Series 6: ax dwi_adc · axial · 3.0mm · 0.60mm/px · z∈[-68,+94]mm · 5 of 55 slices shown]
[im 1/55]
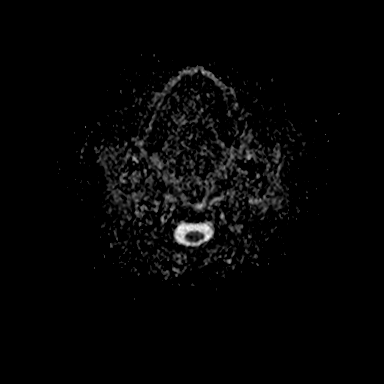
[im 14/55]
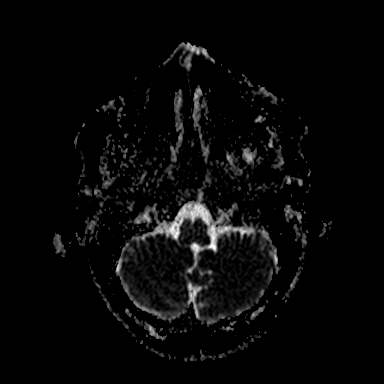
[im 28/55]
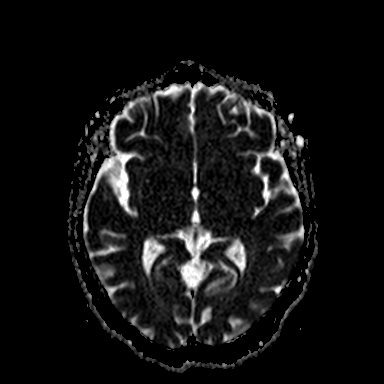
[im 41/55]
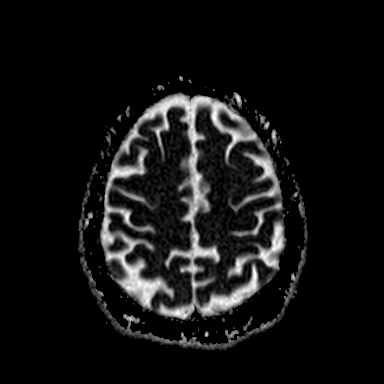
[im 55/55]
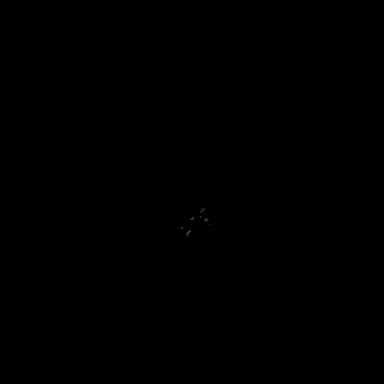

[Series 7: cor dwi_tracew · coronal · 5.0mm · 0.60mm/px · 3 of 36 slices shown]
[im 1/36]
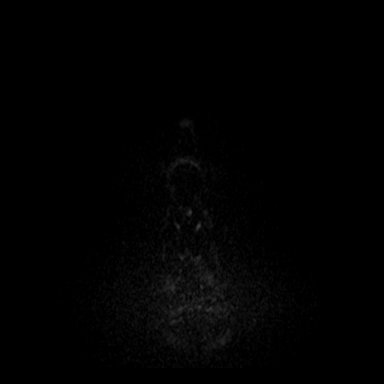
[im 18/36]
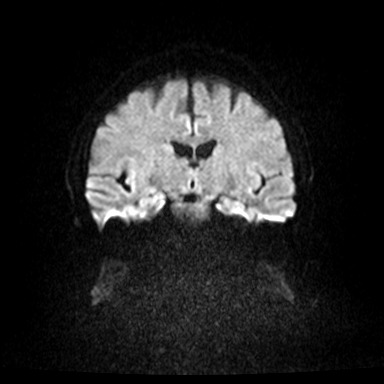
[im 36/36]
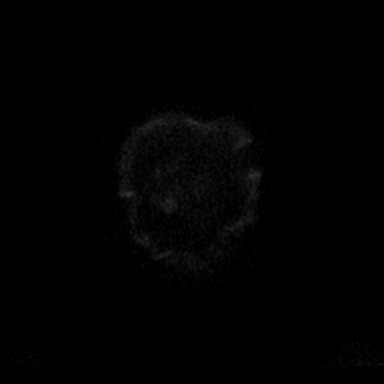

[Series 8: cor dwi_adc · coronal · 5.0mm · 0.60mm/px · 3 of 36 slices shown]
[im 1/36]
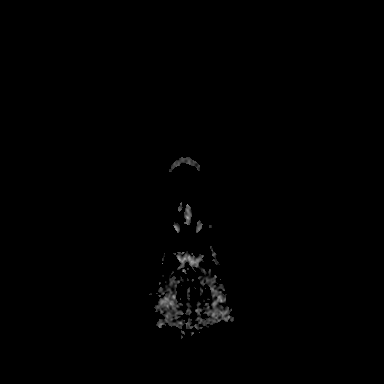
[im 18/36]
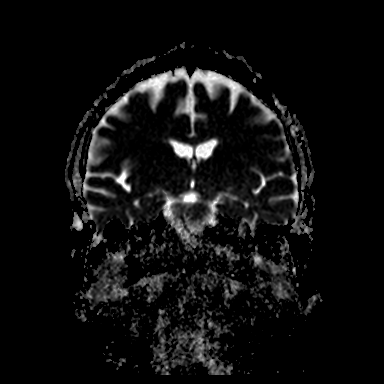
[im 36/36]
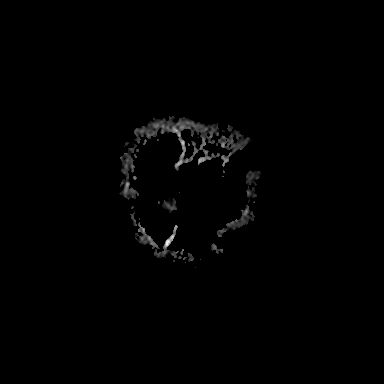

[Series 9: T1 · sagittal · 5.0mm · 0.62mm/px · 2 of 21 slices shown (1 of 2)]
[im 1/21]
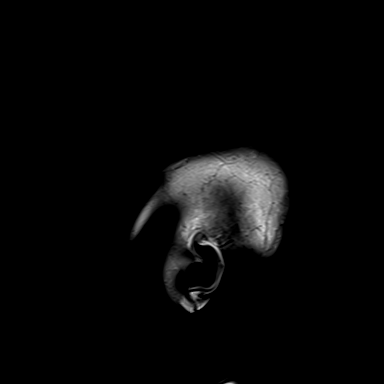
[im 21/21]
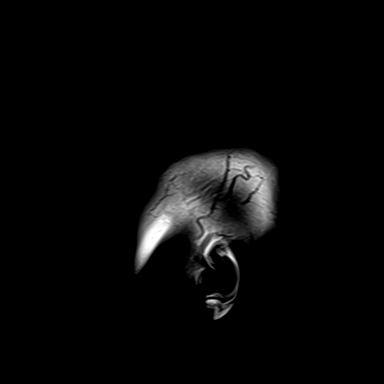

[Series 10: T2 · axial · 5.0mm · 0.53mm/px · z∈[-58,+86]mm · 2 of 25 slices shown (1 of 2)]
[im 1/25]
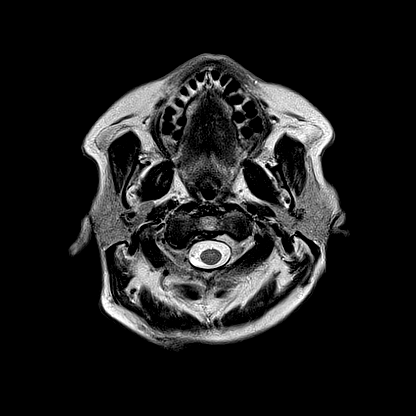
[im 25/25]
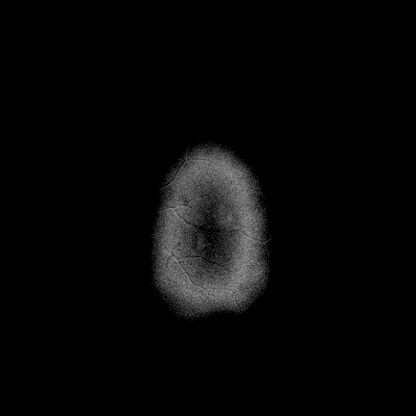

[Series 11: swi_images · axial · 3.0mm · 0.90mm/px · z∈[-74,+103]mm · 5 of 60 slices shown]
[im 1/60]
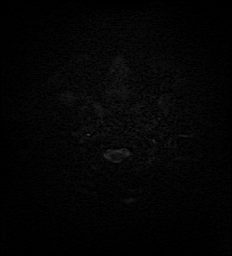
[im 15/60]
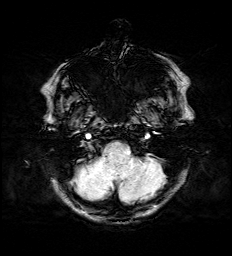
[im 30/60]
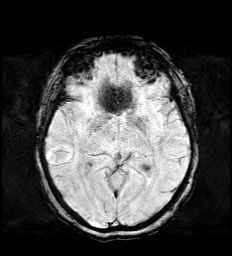
[im 45/60]
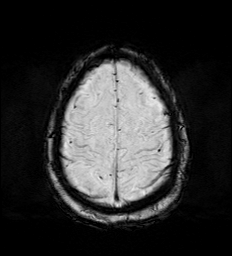
[im 60/60]
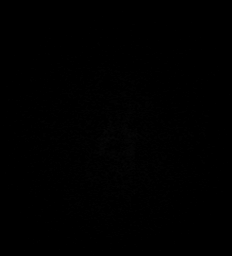

[Series 13: FLAIR · axial · 3.0mm · 0.53mm/px · z∈[-67,+95]mm · 5 of 55 slices shown]
[im 1/55]
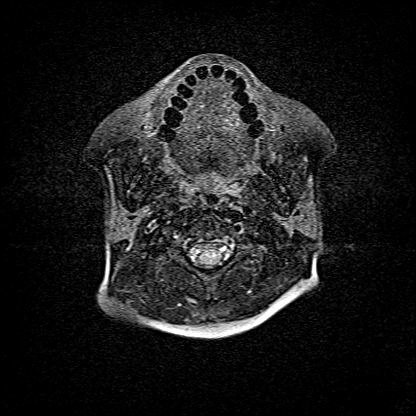
[im 14/55]
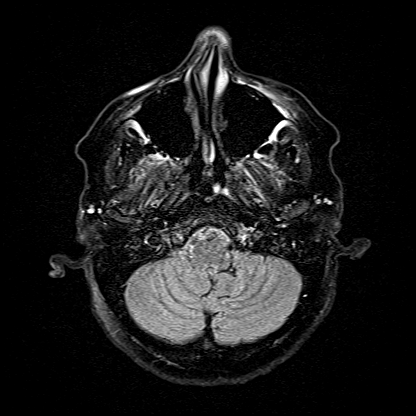
[im 28/55]
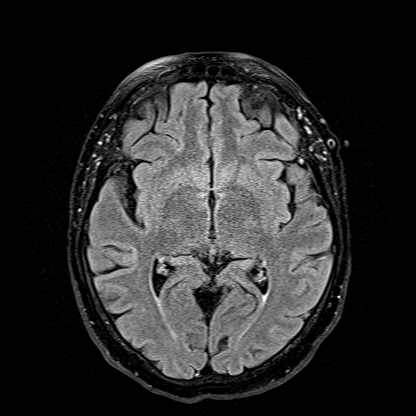
[im 41/55]
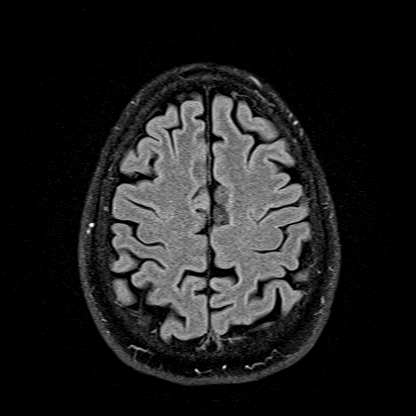
[im 55/55]
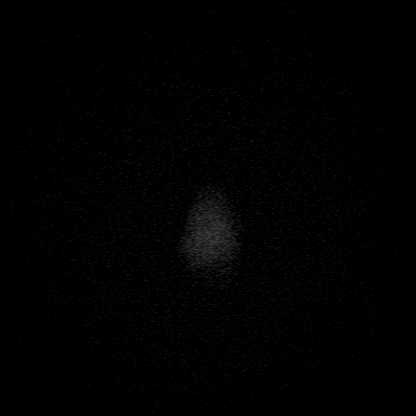

[Series 14: T1 · axial · 1.0mm · 0.98mm/px · z∈[-49,+94]mm · 8 of 144 slices shown (2 of 2)]
[im 1/144]
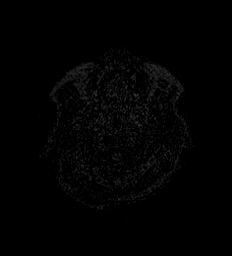
[im 27/144]
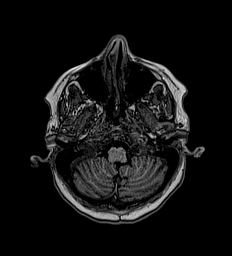
[im 40/144]
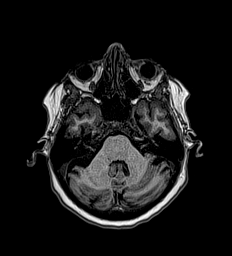
[im 66/144]
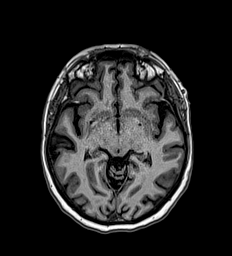
[im 79/144]
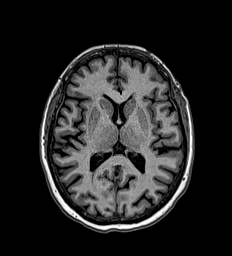
[im 105/144]
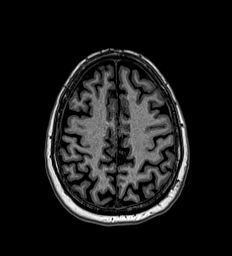
[im 118/144]
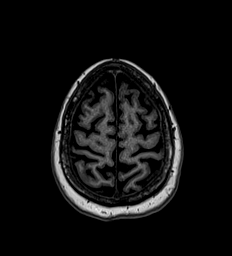
[im 144/144]
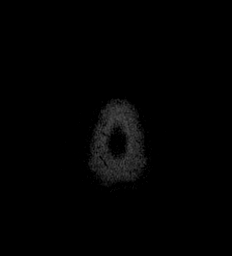

[Series 15: T2 · coronal · 5.0mm · 0.57mm/px · 2 of 27 slices shown (2 of 2)]
[im 1/27]
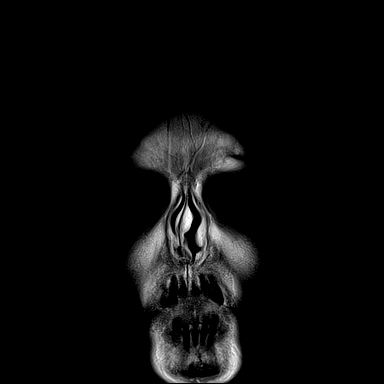
[im 27/27]
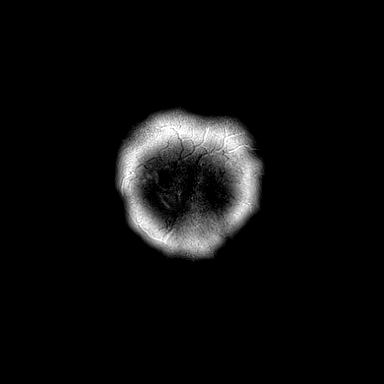

[40 of 48 positions shown; findings below may reference images not displayed]

FINDINGS: INTRACRANIAL CONTENTS: No reduced diffusion to suggest acute
ischemia. No susceptibility artifact to suggest hemorrhage. The
ventricles and sulci are normal for patient's age. A few scattered
subcentimeter supratentorial white matter FLAIR T2 hyperintensities
are less than expected for age, most commonly seen with chronic
small vessel ischemic changes. No suspicious parenchymal signal,
masses, mass effect. No abnormal extra-axial fluid collections. No
extra-axial masses.

VASCULAR: Normal major intracranial vascular flow voids present at
skull base.

SKULL AND UPPER CERVICAL SPINE: No abnormal sellar expansion. No
suspicious calvarial bone marrow signal. Craniocervical junction
maintained. Proximal

SINUSES/ORBITS: Mild maxillary sinus mucosal thickening. Minimal
mastoid effusions. The included ocular globes and orbital contents
are non-suspicious.

OTHER: None.
IMPRESSION: Negative noncontrast MRI head for age.

## 2019-01-25 DIAGNOSIS — G251 Drug-induced tremor: Secondary | ICD-10-CM | POA: Insufficient documentation

## 2019-01-25 DIAGNOSIS — G250 Essential tremor: Secondary | ICD-10-CM | POA: Insufficient documentation

## 2019-01-25 DIAGNOSIS — R2689 Other abnormalities of gait and mobility: Secondary | ICD-10-CM | POA: Insufficient documentation

## 2019-01-25 DIAGNOSIS — Z9181 History of falling: Secondary | ICD-10-CM | POA: Insufficient documentation

## 2019-01-31 ENCOUNTER — Encounter: Payer: Self-pay | Admitting: Emergency Medicine

## 2019-01-31 ENCOUNTER — Emergency Department
Admission: EM | Admit: 2019-01-31 | Discharge: 2019-01-31 | Disposition: A | Discharge status: Home / Self Care | Payer: Medicare Other | Attending: Student | Admitting: Student

## 2019-01-31 ENCOUNTER — Other Ambulatory Visit: Payer: Self-pay

## 2019-01-31 ENCOUNTER — Emergency Department: Payer: Medicare Other

## 2019-01-31 DIAGNOSIS — Z96642 Presence of left artificial hip joint: Secondary | ICD-10-CM | POA: Insufficient documentation

## 2019-01-31 DIAGNOSIS — M24452 Recurrent dislocation, left hip: Secondary | ICD-10-CM | POA: Diagnosis present

## 2019-01-31 DIAGNOSIS — Z79899 Other long term (current) drug therapy: Secondary | ICD-10-CM | POA: Insufficient documentation

## 2019-01-31 DIAGNOSIS — F17210 Nicotine dependence, cigarettes, uncomplicated: Secondary | ICD-10-CM | POA: Diagnosis not present

## 2019-01-31 DIAGNOSIS — J45909 Unspecified asthma, uncomplicated: Secondary | ICD-10-CM | POA: Insufficient documentation

## 2019-01-31 DIAGNOSIS — S73005A Unspecified dislocation of left hip, initial encounter: Secondary | ICD-10-CM

## 2019-01-31 LAB — CBC WITH DIFFERENTIAL/PLATELET
Abs Immature Granulocytes: 0.05 10*3/uL (ref 0.00–0.07)
Basophils Absolute: 0.1 10*3/uL (ref 0.0–0.1)
Basophils Relative: 1 %
Eosinophils Absolute: 0.1 10*3/uL (ref 0.0–0.5)
Eosinophils Relative: 1 %
HCT: 41.4 % (ref 36.0–46.0)
Hemoglobin: 14.1 g/dL (ref 12.0–15.0)
Immature Granulocytes: 1 %
Lymphocytes Relative: 24 %
Lymphs Abs: 2 10*3/uL (ref 0.7–4.0)
MCH: 32.9 pg (ref 26.0–34.0)
MCHC: 34.1 g/dL (ref 30.0–36.0)
MCV: 96.7 fL (ref 80.0–100.0)
Monocytes Absolute: 0.6 10*3/uL (ref 0.1–1.0)
Monocytes Relative: 7 %
Neutro Abs: 5.6 10*3/uL (ref 1.7–7.7)
Neutrophils Relative %: 66 %
Platelets: 202 10*3/uL (ref 150–400)
RBC: 4.28 MIL/uL (ref 3.87–5.11)
RDW: 13.2 % (ref 11.5–15.5)
WBC: 8.4 10*3/uL (ref 4.0–10.5)
nRBC: 0 % (ref 0.0–0.2)

## 2019-01-31 LAB — COMPREHENSIVE METABOLIC PANEL
ALT: <5 U/L (ref 0–44)
AST: 15 U/L (ref 15–41)
Albumin: 3.9 g/dL (ref 3.5–5.0)
Alkaline Phosphatase: 59 U/L (ref 38–126)
Anion gap: 7 (ref 5–15)
BUN: 13 mg/dL (ref 8–23)
CO2: 23 mmol/L (ref 22–32)
Calcium: 8.7 mg/dL — ABNORMAL LOW (ref 8.9–10.3)
Chloride: 109 mmol/L (ref 98–111)
Creatinine, Ser: 0.75 mg/dL (ref 0.44–1.00)
GFR calc Af Amer: >60 mL/min (ref >60)
GFR calc non Af Amer: >60 mL/min (ref >60)
Glucose, Bld: 125 mg/dL — ABNORMAL HIGH (ref 70–99)
Potassium: 4 mmol/L (ref 3.5–5.1)
Sodium: 139 mmol/L (ref 135–145)
Total Bilirubin: 0.8 mg/dL (ref 0.3–1.2)
Total Protein: 6.7 g/dL (ref 6.5–8.1)

## 2019-01-31 MED ORDER — ONDANSETRON HCL 4 MG/2ML IJ SOLN
INTRAMUSCULAR | Status: AC
  Filled 2019-01-31: qty 2

## 2019-01-31 MED ORDER — KETAMINE HCL 10 MG/ML IJ SOLN
30.0000 mg | Freq: Once | INTRAMUSCULAR | Status: AC
  Administered 2019-01-31: 16:00:00 30 mg via INTRAVENOUS
  Filled 2019-01-31: qty 1

## 2019-01-31 MED ORDER — ONDANSETRON HCL 4 MG/2ML IJ SOLN
4.0000 mg | Freq: Once | INTRAMUSCULAR | Status: AC
  Administered 2019-01-31: 4 mg via INTRAVENOUS

## 2019-01-31 MED ORDER — FENTANYL CITRATE (PF) 100 MCG/2ML IJ SOLN
50.0000 ug | Freq: Once | INTRAMUSCULAR | Status: AC
  Administered 2019-01-31: 50 ug via INTRAVENOUS
  Filled 2019-01-31: qty 2

## 2019-01-31 MED ORDER — ONDANSETRON HCL 4 MG/2ML IJ SOLN
4.0000 mg | Freq: Once | INTRAMUSCULAR | Status: AC
  Administered 2019-01-31: 14:00:00 4 mg via INTRAVENOUS
  Filled 2019-01-31: qty 2

## 2019-01-31 MED ORDER — HYDROCODONE-ACETAMINOPHEN 5-325 MG PO TABS: 1.0000 | ORAL_TABLET | Freq: Four times a day (QID) | ORAL | 0 refills | Status: DC | PRN

## 2019-01-31 MED ORDER — PROPOFOL 10 MG/ML IV BOLUS
30.0000 mg | Freq: Once | INTRAVENOUS | Status: AC
  Administered 2019-01-31: 16:00:00 30 mg via INTRAVENOUS
  Filled 2019-01-31: qty 20

## 2019-01-31 NOTE — Sedation Documentation (Signed)
Dr. Joan Mayans performing procedure at this time.

## 2019-01-31 NOTE — ED Triage Notes (Signed)
Patient from home via [facility], reports she was leaning over on couch and felt her left hip pop out of joint. Patient states this is the 5th time this has happened recently. Patient had hip replacement by Dr. Marry Guan 3 years ago. Given 50 mcg Fentanyl by EMS PTA.

## 2019-01-31 NOTE — Discharge Instructions (Addendum)
Thank you for letting us take care of you in the emergency department today.   Please continue to take your regular, prescribed medications.   New medications we have prescribed:  - Hydrocodone/acetaminophen - please take as needed for pain control that you cannot improve with over the counter medications.   Please follow up with: - Your primary care doctor to review your ER visit and follow up on your symptoms.  - Your orthopedic doctor  Please return to the ER for any new or worsening symptoms.

## 2019-01-31 NOTE — ED Notes (Signed)
Pt tolerated gingerale without issue.  Waiting for ride for discharge home.

## 2019-01-31 NOTE — Sedation Documentation (Signed)
Relocation felt at this time by Dr. Joan Mayans.

## 2019-01-31 NOTE — ED Provider Notes (Signed)
[facility] Emergency Department Provider Note  ____________________________________________   First MD Initiated Contact with Patient 01/31/19 1333     (approximate)  I have reviewed the triage vital signs and the nursing notes.  History  Chief Complaint Dislocation    HPI [name] is a 63 y.o. female s/p L THA in 2017 with Dr. Bess Harvest who presents to the ED for recurrent left hip dislocation. Patient states she was sitting on her couch when she leaned over and her hip dislocated. Occurred just prior to arrival. No associated trauma. This is the fifth time she has dislocated it since her surgery. She is scheduled for a revision in several weeks.  Pain is severe, localized to the left hip, and worsened with movement. Improved with laying still. No numbness or tingling.          Past Medical Hx Past Medical History:  Diagnosis Date   Anxiety    Anxiety    panic anxiety disorder   Arthritis    Asthma    chronic asthmatic bronchitis   Bipolar disorder ([place])    Cancer ([place])    Desmoid tumor left forearm   Depression    ETOH abuse    Heart murmur    Insomnia    Neuropathy     Problem List Patient Active Problem List   Diagnosis Date Noted   Displacement of internal left hip prosthesis ([place]) 11/29/2018   Hip dislocation, left ([place]) 11/29/2018   Hip dislocation, left, sequela 08/06/2018   High anion gap metabolic acidosis 78/29/5621   Overdose of antidepressant 07/03/2016   S/P total hip arthroplasty 09/17/2015   Bipolar I disorder, most recent episode mixed, severe with psychotic features ([place]) 07/03/2015   Alcohol use disorder, severe, dependence ([place]) 01/26/2015   Tobacco use disorder 01/26/2015   Alcohol withdrawal ([place]) 01/08/2015   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[place]) 01/08/2015   GAD (generalized anxiety disorder) 12/24/2013   PTSD (post-traumatic stress disorder) 12/22/2013    Past  Surgical Hx Past Surgical History:  Procedure Laterality Date   ABDOMINAL HYSTERECTOMY     ABDOMINAL HYSTERECTOMY  2007   ARM AMPUTATION Left 1998   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   ARM AMPUTATION AT SHOULDER Left    [place]   x 2   ESOPHAGOGASTRODUODENOSCOPY (EGD) WITH PROPOFOL N/A 04/27/2018   Procedure: ESOPHAGOGASTRODUODENOSCOPY (EGD) WITH PROPOFOL;  Surgeon: Tol[place], Benay Pike, MD;  Location: [facility] ENDOSCOPY;  Service: Gastroenterology;  Laterality: N/A;   JOINT REPLACEMENT Right 2012   hip   LUMBAR LAMINECTOMY/DECOMPRESSION MICRODISCECTOMY Left 04/16/2016   Procedure: LUMBAR LAMINECTOMY/DECOMPRESSION MICRODISCECTOMY 1 LEVEL;  Surgeon: Meade Maw, MD;  Location: [facility] ORS;  Service: Neurosurgery;  Laterality: Left;  Left L4-5 far lateral discectomy, left L4-5 laminoforaminotomy   TOTAL HIP ARTHROPLASTY Left 09/17/2015   Procedure: TOTAL HIP ARTHROPLASTY;  Surgeon: Dereck Leep, MD;  Location: [facility] ORS;  Service: Orthopedics;  Laterality: Left;    Medications Prior to Admission medications   Medication Sig Start Date End Date Taking? Authorizing Provider  calcium carbonate (OS-CAL - DOSED IN MG OF ELEMENTAL CALCIUM) 1250 (500 Ca) MG tablet Take 1 tablet (500 mg of elemental calcium total) by mouth daily with breakfast. 11/29/18   Fritzi Mandes, MD  carbidopa-levodopa (SINEMET IR) 25-100 MG tablet Take 1 tablet by mouth 3 (three) times daily.    [provider]  clonazePAM (KLONOPIN) 0.5 MG tablet Take  1 tablet (0.5 mg total) by mouth 2 (two) times daily as needed for anxiety. 12/31/18   Carrie Mew, MD  famotidine (PEPCID) 20 MG tablet Take 20 mg by mouth 2 (two) times daily.    [provider]  gabapentin (NEURONTIN) 300 MG capsule Take 3 capsules (900 mg total) by mouth 3 (three) times daily. 05/21/17   Pucilowska, Wardell Honour, MD  Multiple Vitamin (MULTIVITAMIN WITH MINERALS) TABS tablet Take 1 tablet by  mouth daily. 08/09/18   Fritzi Mandes, MD  propranolol (INDERAL) 10 MG tablet Take 1 tablet (10 mg total) by mouth 3 (three) times daily. 05/21/17   Pucilowska, Wardell Honour, MD  traMADol (ULTRAM) 50 MG tablet Take 1 tablet (50 mg total) by mouth every 6 (six) hours as needed for moderate pain. 11/29/18   Fritzi Mandes, MD  traZODone (DESYREL) 150 MG tablet Take 150 mg by mouth at bedtime.     [provider]  venlafaxine XR (EFFEXOR-XR) 150 MG 24 hr capsule Take 2 capsules (300 mg total) by mouth daily with breakfast. 11/29/18   Fritzi Mandes, MD    Allergies Benadryl [diphenhydramine], Benadryl [diphenhydramine], Cephalosporins, Codeine, and Codeine  Family Hx Family History  Problem Relation Age of Onset   COPD Mother     Social Hx Social History   Tobacco Use   Smoking status: Current Every Day Smoker    Packs/day: 0.25    Types: Cigarettes   Smokeless tobacco: Never Used  Substance Use Topics   Alcohol use: Yes    Comment: 2 40oz beers daily   Drug use: No    Comment: Patient denies      Review of Systems  Constitutional: Negative for fever. Negative for chills. Eyes: Negative for visual changes. ENT: Negative for sore throat. Cardiovascular: Negative for chest pain. Respiratory: Negative for shortness of breath. Gastrointestinal: Negative for abdominal pain. Negative for nausea. Negative for vomiting. Genitourinary: Negative for dysuria. Musculoskeletal: + left hip pain Skin: Negative for rash. Neurological: Negative for for headaches.   Physical Exam  Vital Signs: ED Triage Vitals  Enc Vitals Group     BP 01/31/19 1339 131/63     Pulse Rate 01/31/19 1339 73     Resp 01/31/19 1339 (!) 22     Temp 01/31/19 1339 99.2 F (37.3 C)     Temp Source 01/31/19 1339 Oral     SpO2 01/31/19 1339 97 %     Weight 01/31/19 1341 137 lb (62.1 kg)     Height 01/31/19 1341 5\' 5"  (1.651 m)     Head Circumference --      Peak Flow --      Pain Score 01/31/19 1340 10      Pain Loc --      Pain Edu? --      Excl. in [place]? --      Constitutional: Alert and oriented.  Eyes: Conjunctivae clear. Sclera anicteric. Head: Normocephalic. Atraumatic. Nose: No congestion. No rhinorrhea. Mouth/Throat: Mucous membranes are moist.  Neck: No stridor.   Cardiovascular: Normal rate, regular rhythm. No murmurs. Extremities well perfused. 2+ DP pulses, symmetric. Cap refill < 2 seconds.  Respiratory: Normal respiratory effort.  Lungs CTAB. Gastrointestinal: Soft and non-tender. No distention.  Musculoskeletal: L leg shortened, internally rotated. ROM deferred secondary to known deformity. 2+ DP pulse and symmetric, normal sensation. Able to wiggle toes.  Neurologic:  Normal speech and language. No gross focal neurologic deficits are appreciated. Sensation in tact to bilateral lower extremities. Able to  wiggle toes.  Skin: Skin is warm, dry and intact. No rash noted. Psychiatric: Mood and affect are appropriate for situation.  EKG  N/A  Radiology  XR hip: confirms prosthetic dislocation of left hip  Post reduction XR: satisfactory reduction of left hip   Procedures  Procedure(s) performed (including critical care):  .Sedation  Date/Time: 01/31/2019 4:44 PM Performed by: Lilia Pro., MD Authorized by: Lilia Pro., MD   Consent:    Consent obtained:  Verbal and written   Consent given by:  Patient   Risks discussed:  Respiratory compromise necessitating ventilatory assistance and intubation, nausea, vomiting, inadequate sedation and prolonged hypoxia resulting in organ damage Universal protocol:    Procedure explained and questions answered to patient or proxy's satisfaction: yes     Relevant documents present and verified: yes     Imaging studies available: yes     Immediately prior to procedure a time out was called: yes     Patient identity confirmation method:  Verbally with patient and arm band Indications:    Procedure performed:  Dislocation  reduction   Procedure necessitating sedation performed by:  Physician performing sedation Pre-sedation assessment:    Time since last food or drink:  5.5 hours   ASA classification: class 2 - patient with mild systemic disease     Neck mobility: normal     Mouth opening:  3 or more finger widths   Mallampati score:  II - soft palate, uvula, fauces visible   Pre-sedation assessments completed and reviewed: airway patency, cardiovascular function, hydration status, mental status, nausea/vomiting, pain level and respiratory function     Pre-sedation assessment completed:  01/31/2019 2:00 PM Immediate pre-procedure details:    Reassessment: Patient reassessed immediately prior to procedure     Reviewed: vital signs, relevant labs/tests and NPO status     Verified: bag valve mask available, emergency equipment available, intubation equipment available, IV patency confirmed, oxygen available and suction available   Procedure details (see MAR for exact dosages):    Preoxygenation:  Nasal cannula   Sedation:  Ketamine and propofol   Intra-procedure monitoring:  Blood pressure monitoring, cardiac monitor, continuous capnometry, continuous pulse oximetry and frequent vital sign checks   Intra-procedure events: none     Intra-procedure management:  Airway repositioning   Total Provider sedation time (minutes):  43 Post-procedure details:    Post-sedation assessment completed:  01/31/2019 4:10 PM   Recovery: Patient returned to pre-procedure baseline     Post-sedation assessments completed and reviewed: airway patency, cardiovascular function, hydration status, mental status and pain level     Patient tolerance:  Tolerated well, no immediate complications .Ortho Injury Treatment  Date/Time: 01/31/2019 4:48 PM Performed by: Lilia Pro., MD Authorized by: Lilia Pro., MD   Consent:    Consent obtained:  Verbal and written   Consent given by:  Patient   Risks discussed:  Recurrent  dislocation, irreducible dislocation and fractureInjury location: hip Location details: left hip Injury type: dislocation Dislocation type: posterior Spontaneous dislocation: yes Prosthesis: yes Pre-procedure distal perfusion: normal Pre-procedure neurological function: normal Pre-procedure range of motion: reduced  Patient sedated: Yes. Refer to sedation procedure documentation for details of sedation. Manipulation performed: yes Reduction method: traction and counter traction (hip flexion, adduction, internal rotation) Reduction successful: yes X-ray confirmed reduction: yes Post-procedure distal perfusion: normal Post-procedure neurological function: normal Post-procedure range of motion: normal Patient tolerance: patient tolerated the procedure well with no immediate complications     Initial Impression /  Assessment and Plan / ED Course  63 y.o. female s/p L THA in 2017 with Dr. Bess Harvest, hx of recurrent hip dislocations, who presents to the ED for recurrent left hip dislocation.  Exam c/w dislocation, XR confirms dislocation, no fracture.   Discussed with orthopedics, will proceed with sedation and reduction. NPO x 4 hours.   Hip successfully reduced under conscious sedation.  Postreduction films are appropriate and reveal satisfactory interval reduction. She has tolerated PO after her sedation and is back to baseline. Will plan for discharge w/ short course pain medication (PDMP reviewed) and advised close orthopedic follow up. Patient voices understanding.   Final Clinical Impression(s) / ED Diagnosis  Final diagnoses:  Closed dislocation of left hip, initial encounter [facility])     Note:  This document was prepared using Dragon voice recognition software and may include unintentional dictation errors.   Lilia Pro., MD 01/31/19 [id number]

## 2019-01-31 NOTE — ED Notes (Signed)
Pt given ginger ale to trial PO, request per Dr. Joan Mayans.

## 2019-02-22 DIAGNOSIS — I10 Essential (primary) hypertension: Secondary | ICD-10-CM | POA: Insufficient documentation

## 2019-03-06 NOTE — Discharge Instructions (Signed)
Instructions after Total Hip Replacement ° ° °  Angello Chien P. Yuko Coventry, Jr., M.D.    ° Dept. of Orthopaedics & Sports Medicine ° [facility] ° [address] ° [place], [place]  [address] ° Phone: [phone]   Fax: [phone] ° °  °DIET: °• Drink plenty of non-alcoholic fluids. °• Resume your normal diet. Include foods high in fiber. ° °ACTIVITY:  °• You may use crutches or a walker with weight-bearing as tolerated, unless instructed otherwise. °• You may be weaned off of the walker or crutches by your Physical Therapist.  °• Do NOT reach below the level of your knees or cross your legs until allowed.    °• Continue doing gentle exercises. Exercising will reduce the pain and swelling, increase motion, and prevent muscle weakness.   °• Please continue to use the TED compression stockings for 6 weeks. You may remove the stockings at night, but should reapply them in the morning. °• Do not drive or operate any equipment until instructed. ° °WOUND CARE:  °• Continue to use ice packs periodically to reduce pain and swelling. °• Keep the incision clean and dry. °• You may bathe or shower after the staples are removed at the first office visit following surgery. ° °MEDICATIONS: °• You may resume your regular medications. °• Please take the pain medication as prescribed on the medication. °• Do not take pain medication on an empty stomach. °• You have been given a prescription for a blood thinner to prevent blood clots. Please take the medication as instructed. (NOTE: After completing a 2 week course of Lovenox, take one Enteric-coated aspirin once a day.) °• Pain medications and iron supplements can cause constipation. Use a stool softener (Senokot or Colace) on a daily basis and a laxative (dulcolax or miralax) as needed. °• Do not drive or drink alcoholic beverages when taking pain medications. ° °CALL THE OFFICE FOR: °• Temperature above 101 degrees °• Excessive bleeding or drainage on the dressing. °• Excessive  swelling, coldness, or paleness of the toes. °• Persistent nausea and vomiting. ° °FOLLOW-UP:  °• You should have an appointment to return to the office in 6 weeks after surgery. °• Arrangements have been made for continuation of Physical Therapy (either home therapy or outpatient therapy). °  °

## 2019-03-09 ENCOUNTER — Other Ambulatory Visit: Payer: Self-pay

## 2019-03-09 ENCOUNTER — Encounter
Admission: RE | Admit: 2019-03-09 | Discharge: 2019-03-09 | Disposition: A | Discharge status: Home / Self Care | Payer: Medicare Other | Source: Ambulatory Visit | Attending: Orthopedic Surgery | Admitting: Orthopedic Surgery

## 2019-03-09 DIAGNOSIS — Z0184 Encounter for antibody response examination: Secondary | ICD-10-CM | POA: Diagnosis not present

## 2019-03-09 DIAGNOSIS — R9431 Abnormal electrocardiogram [ECG] [EKG]: Secondary | ICD-10-CM | POA: Diagnosis not present

## 2019-03-09 DIAGNOSIS — Z01812 Encounter for preprocedural laboratory examination: Secondary | ICD-10-CM | POA: Diagnosis present

## 2019-03-09 DIAGNOSIS — Z01810 Encounter for preprocedural cardiovascular examination: Secondary | ICD-10-CM | POA: Insufficient documentation

## 2019-03-09 LAB — COMPREHENSIVE METABOLIC PANEL
ALT: <5 U/L (ref 0–44)
AST: 14 U/L — ABNORMAL LOW (ref 15–41)
Albumin: 4 g/dL (ref 3.5–5.0)
Alkaline Phosphatase: 75 U/L (ref 38–126)
Anion gap: 8 (ref 5–15)
BUN: 16 mg/dL (ref 8–23)
CO2: 30 mmol/L (ref 22–32)
Calcium: 9.3 mg/dL (ref 8.9–10.3)
Chloride: 98 mmol/L (ref 98–111)
Creatinine, Ser: 0.82 mg/dL (ref 0.44–1.00)
GFR calc Af Amer: >60 mL/min (ref >60)
GFR calc non Af Amer: >60 mL/min (ref >60)
Glucose, Bld: 107 mg/dL — ABNORMAL HIGH (ref 70–99)
Potassium: 4.1 mmol/L (ref 3.5–5.1)
Sodium: 136 mmol/L (ref 135–145)
Total Bilirubin: 0.6 mg/dL (ref 0.3–1.2)
Total Protein: 7 g/dL (ref 6.5–8.1)

## 2019-03-09 LAB — CBC WITH DIFFERENTIAL/PLATELET
Abs Immature Granulocytes: 0.13 10*3/uL — ABNORMAL HIGH (ref 0.00–0.07)
Basophils Absolute: 0.1 10*3/uL (ref 0.0–0.1)
Basophils Relative: 1 %
Eosinophils Absolute: 0.1 10*3/uL (ref 0.0–0.5)
Eosinophils Relative: 1 %
HCT: 37.8 % (ref 36.0–46.0)
Hemoglobin: 13.1 g/dL (ref 12.0–15.0)
Immature Granulocytes: 2 %
Lymphocytes Relative: 20 %
Lymphs Abs: 1.6 10*3/uL (ref 0.7–4.0)
MCH: 32.6 pg (ref 26.0–34.0)
MCHC: 34.7 g/dL (ref 30.0–36.0)
MCV: 94 fL (ref 80.0–100.0)
Monocytes Absolute: 0.6 10*3/uL (ref 0.1–1.0)
Monocytes Relative: 8 %
Neutro Abs: 5.5 10*3/uL (ref 1.7–7.7)
Neutrophils Relative %: 68 %
Platelets: 227 10*3/uL (ref 150–400)
RBC: 4.02 MIL/uL (ref 3.87–5.11)
RDW: 12.8 % (ref 11.5–15.5)
WBC: 8 10*3/uL (ref 4.0–10.5)
nRBC: 0 % (ref 0.0–0.2)

## 2019-03-09 LAB — URINALYSIS, ROUTINE W REFLEX MICROSCOPIC
Bilirubin Urine: NEGATIVE
Glucose, UA: NEGATIVE mg/dL
Ketones, ur: NEGATIVE mg/dL
Nitrite: NEGATIVE
Protein, ur: NEGATIVE mg/dL
Specific Gravity, Urine: 1.016 (ref 1.005–1.030)
WBC, UA: >50 WBC/hpf — ABNORMAL HIGH (ref 0–5)
pH: 6 (ref 5.0–8.0)

## 2019-03-09 LAB — PROTIME-INR
INR: 0.9 (ref 0.8–1.2)
Prothrombin Time: 12.4 seconds (ref 11.4–15.2)

## 2019-03-09 LAB — SEDIMENTATION RATE: Sed Rate: 16 mm/hr (ref 0–30)

## 2019-03-09 LAB — SURGICAL PCR SCREEN
MRSA, PCR: NEGATIVE
Staphylococcus aureus: NEGATIVE

## 2019-03-09 LAB — TYPE AND SCREEN
ABO/RH(D): A NEG
Antibody Screen: NEGATIVE

## 2019-03-09 LAB — APTT: aPTT: 26 seconds (ref 24–36)

## 2019-03-09 LAB — C-REACTIVE PROTEIN: CRP: 0.9 mg/dL (ref <1.0)

## 2019-03-09 NOTE — Progress Notes (Signed)
Pre-Admit Testing Provider Notification Note  Provider Notified: Dr. Marry Guan  Notification Mode: Fax  Reason: Abnormal EKG requiring clearance  Response: Fax confirmation received and placed on chart.  Additional Information: Notified Margaretha Sheffield, Dr. Clydell Hakim nurse by phone that the fax was sent.  Signed: Beulah Gandy, RN

## 2019-03-09 NOTE — Patient Instructions (Signed)
Your procedure is scheduled on: Wednesday, March 16, 2019 Report to Day Surgery on the 2nd floor of the [facility]. To find out your arrival time, please call [phone] between 1PM - 3PM on: Tuesday, September 22  REMEMBER: Instructions that are not followed completely may result in serious medical risk, up to and including death; or upon the discretion of your surgeon and anesthesiologist your surgery may need to be rescheduled.  Do not eat food after midnight the night before surgery.  No gum chewing, lozengers or hard candies.  You may however, drink CLEAR liquids up to 2 hours before you are scheduled to arrive for your surgery. Do not drink anything within 2 hours of the start of your surgery.  Clear liquids include: - water  - apple juice without pulp - gatorade - black coffee or tea (Do NOT add milk or creamers to the coffee or tea) Do NOT drink anything that is not on this list.  ENSURE PRE-SURGERY CARBOHYDRATE DRINK:  Complete drinking 3 hours prior to surgery.  No Alcohol for 24 hours before or after surgery.  No Smoking including e-cigarettes for 24 hours prior to surgery.  No chewable tobacco products for at least 6 hours prior to surgery.  No nicotine patches on the day of surgery.  On the morning of surgery brush your teeth with toothpaste and water, you may rinse your mouth with mouthwash if you wish. Do not swallow any toothpaste or mouthwash.  Notify your doctor if there is any change in your medical condition (cold, fever, infection).  Do not wear jewelry, make-up, hairpins, clips or nail polish.  Do not wear lotions, powders, or perfumes.   Do not shave 48 hours prior to surgery.   Contacts and dentures may not be worn into surgery.  Do not bring valuables to the hospital, including drivers license, insurance or credit cards.  [facility] is not responsible for any belongings or valuables.   TAKE THESE MEDICATIONS THE MORNING OF SURGERY:  1.   Amantadine 2.  Carbidopa-levodopa 3.  Propranolol 4.  Venlafaxine  Use CHG Soap or wipes as directed on instruction sheet.  NOW!  Stop ASPRIN, Anti-inflammatories (NSAIDS) such as Advil, Aleve, Ibuprofen, Motrin, Naproxen, Naprosyn and Aspirin based products such as Excedrin, Goodys Powder, BC Powder. (May take Tylenol or Acetaminophen if needed.)  NOW!  Stop ANY OVER THE COUNTER supplements until after surgery. (CALCIUM)  Plan for stool softeners for home use.  Please call [phone] if you have any questions about these instructions.

## 2019-03-10 LAB — URINE CULTURE: Special Requests: NORMAL

## 2019-03-11 ENCOUNTER — Other Ambulatory Visit
Admission: RE | Admit: 2019-03-11 | Discharge: 2019-03-11 | Disposition: A | Discharge status: Home / Self Care | Payer: Medicare Other | Source: Ambulatory Visit | Attending: Orthopedic Surgery | Admitting: Orthopedic Surgery

## 2019-03-11 ENCOUNTER — Other Ambulatory Visit: Payer: Self-pay

## 2019-03-11 DIAGNOSIS — Z20828 Contact with and (suspected) exposure to other viral communicable diseases: Secondary | ICD-10-CM | POA: Insufficient documentation

## 2019-03-11 DIAGNOSIS — Z01812 Encounter for preprocedural laboratory examination: Secondary | ICD-10-CM | POA: Diagnosis present

## 2019-03-11 LAB — SARS CORONAVIRUS 2 (TAT 6-24 HRS): SARS Coronavirus 2: NEGATIVE

## 2019-03-16 ENCOUNTER — Inpatient Hospital Stay: Payer: Medicare Other | Admitting: Certified Registered Nurse Anesthetist

## 2019-03-16 ENCOUNTER — Other Ambulatory Visit: Payer: Self-pay

## 2019-03-16 ENCOUNTER — Inpatient Hospital Stay: Payer: Medicare Other

## 2019-03-16 ENCOUNTER — Encounter: Payer: Self-pay | Admitting: *Deleted

## 2019-03-16 ENCOUNTER — Encounter
Admission: RE | Disposition: A | Discharge status: Home Health | Payer: Self-pay | Source: Home / Self Care | Attending: Orthopedic Surgery

## 2019-03-16 ENCOUNTER — Inpatient Hospital Stay
Admission: RE | Admit: 2019-03-16 | Discharge: 2019-03-18 | DRG: 468 | Disposition: A | Discharge status: Home Health | Payer: Medicare Other | Attending: Orthopedic Surgery | Admitting: Orthopedic Surgery

## 2019-03-16 DIAGNOSIS — G629 Polyneuropathy, unspecified: Secondary | ICD-10-CM | POA: Diagnosis present

## 2019-03-16 DIAGNOSIS — K5900 Constipation, unspecified: Secondary | ICD-10-CM | POA: Insufficient documentation

## 2019-03-16 DIAGNOSIS — G20 Parkinson's disease: Secondary | ICD-10-CM | POA: Diagnosis present

## 2019-03-16 DIAGNOSIS — Z888 Allergy status to other drugs, medicaments and biological substances status: Secondary | ICD-10-CM | POA: Diagnosis not present

## 2019-03-16 DIAGNOSIS — Z881 Allergy status to other antibiotic agents status: Secondary | ICD-10-CM

## 2019-03-16 DIAGNOSIS — Z7982 Long term (current) use of aspirin: Secondary | ICD-10-CM | POA: Diagnosis not present

## 2019-03-16 DIAGNOSIS — Z885 Allergy status to narcotic agent status: Secondary | ICD-10-CM | POA: Diagnosis not present

## 2019-03-16 DIAGNOSIS — R011 Cardiac murmur, unspecified: Secondary | ICD-10-CM | POA: Diagnosis present

## 2019-03-16 DIAGNOSIS — Z23 Encounter for immunization: Secondary | ICD-10-CM | POA: Diagnosis not present

## 2019-03-16 DIAGNOSIS — M542 Cervicalgia: Secondary | ICD-10-CM | POA: Insufficient documentation

## 2019-03-16 DIAGNOSIS — I10 Essential (primary) hypertension: Secondary | ICD-10-CM | POA: Diagnosis present

## 2019-03-16 DIAGNOSIS — F319 Bipolar disorder, unspecified: Secondary | ICD-10-CM | POA: Diagnosis present

## 2019-03-16 DIAGNOSIS — M549 Dorsalgia, unspecified: Secondary | ICD-10-CM | POA: Insufficient documentation

## 2019-03-16 DIAGNOSIS — T84021A Dislocation of internal left hip prosthesis, initial encounter: Principal | ICD-10-CM | POA: Diagnosis present

## 2019-03-16 DIAGNOSIS — Z96649 Presence of unspecified artificial hip joint: Secondary | ICD-10-CM

## 2019-03-16 HISTORY — PX: TOTAL HIP REVISION: SHX763

## 2019-03-16 SURGERY — TOTAL HIP REVISION
Anesthesia: General | Site: Hip | Laterality: Left

## 2019-03-16 MED ORDER — CLINDAMYCIN PHOSPHATE 600 MG/50ML IV SOLN
600.0000 mg | Freq: Four times a day (QID) | INTRAVENOUS | Status: AC
  Administered 2019-03-16 – 2019-03-17 (×4): 600 mg via INTRAVENOUS
  Filled 2019-03-16 (×4): qty 50

## 2019-03-16 MED ORDER — CHLORHEXIDINE GLUCONATE 4 % EX LIQD: 60.0000 mL | Freq: Once | CUTANEOUS | Status: DC

## 2019-03-16 MED ORDER — PNEUMOCOCCAL VAC POLYVALENT 25 MCG/0.5ML IJ INJ
0.5000 mL | INJECTION | INTRAMUSCULAR | Status: AC
  Administered 2019-03-17: 10:00:00 0.5 mL via INTRAMUSCULAR
  Filled 2019-03-16: qty 0.5

## 2019-03-16 MED ORDER — CLINDAMYCIN PHOSPHATE 900 MG/50ML IV SOLN
INTRAVENOUS | Status: AC
  Filled 2019-03-16: qty 50

## 2019-03-16 MED ORDER — SENNOSIDES-DOCUSATE SODIUM 8.6-50 MG PO TABS
1.0000 | ORAL_TABLET | Freq: Two times a day (BID) | ORAL | Status: DC
  Administered 2019-03-16 – 2019-03-18 (×5): 1 via ORAL
  Filled 2019-03-16 (×5): qty 1

## 2019-03-16 MED ORDER — TRANEXAMIC ACID-NACL 1000-0.7 MG/100ML-% IV SOLN
1000.0000 mg | INTRAVENOUS | Status: AC
  Administered 2019-03-16: 08:00:00 1000 mg via INTRAVENOUS
  Filled 2019-03-16: qty 100

## 2019-03-16 MED ORDER — SODIUM CHLORIDE FLUSH 0.9 % IV SOLN
INTRAVENOUS | Status: AC
  Filled 2019-03-16: qty 10

## 2019-03-16 MED ORDER — SODIUM CHLORIDE 0.9 % IV SOLN
INTRAVENOUS | Status: DC
  Administered 2019-03-16: 1 mL via INTRAVENOUS
  Administered 2019-03-16: 23:00:00 via INTRAVENOUS

## 2019-03-16 MED ORDER — FERROUS SULFATE 325 (65 FE) MG PO TABS
325.0000 mg | ORAL_TABLET | Freq: Two times a day (BID) | ORAL | Status: DC
  Administered 2019-03-16 – 2019-03-18 (×4): 325 mg via ORAL
  Filled 2019-03-16 (×4): qty 1

## 2019-03-16 MED ORDER — INFLUENZA VAC SPLIT QUAD 0.5 ML IM SUSY
0.5000 mL | PREFILLED_SYRINGE | INTRAMUSCULAR | Status: AC
  Administered 2019-03-17: 10:00:00 0.5 mL via INTRAMUSCULAR
  Filled 2019-03-16: qty 0.5

## 2019-03-16 MED ORDER — ONDANSETRON HCL 4 MG/2ML IJ SOLN: 4.0000 mg | Freq: Four times a day (QID) | INTRAMUSCULAR | Status: DC | PRN

## 2019-03-16 MED ORDER — SUGAMMADEX SODIUM 200 MG/2ML IV SOLN
INTRAVENOUS | Status: AC
  Filled 2019-03-16: qty 2

## 2019-03-16 MED ORDER — SUGAMMADEX SODIUM 200 MG/2ML IV SOLN
INTRAVENOUS | Status: DC | PRN
  Administered 2019-03-16: 200 mg via INTRAVENOUS

## 2019-03-16 MED ORDER — ALUM & MAG HYDROXIDE-SIMETH 200-200-20 MG/5ML PO SUSP: 30.0000 mL | ORAL | Status: DC | PRN

## 2019-03-16 MED ORDER — MIDAZOLAM HCL 2 MG/2ML IJ SOLN
INTRAMUSCULAR | Status: DC | PRN
  Administered 2019-03-16: 2 mg via INTRAVENOUS

## 2019-03-16 MED ORDER — FAMOTIDINE 20 MG PO TABS
20.0000 mg | ORAL_TABLET | Freq: Once | ORAL | Status: AC
  Administered 2019-03-16: 07:00:00 20 mg via ORAL

## 2019-03-16 MED ORDER — KETAMINE HCL 50 MG/ML IJ SOLN
INTRAMUSCULAR | Status: AC
  Filled 2019-03-16: qty 10

## 2019-03-16 MED ORDER — CELECOXIB 200 MG PO CAPS
400.0000 mg | ORAL_CAPSULE | Freq: Once | ORAL | Status: AC
  Administered 2019-03-16: 400 mg via ORAL

## 2019-03-16 MED ORDER — EPHEDRINE SULFATE 50 MG/ML IJ SOLN
INTRAMUSCULAR | Status: DC | PRN
  Administered 2019-03-16 (×4): 10 mg via INTRAVENOUS

## 2019-03-16 MED ORDER — KETAMINE HCL 50 MG/ML IJ SOLN
INTRAMUSCULAR | Status: DC | PRN
  Administered 2019-03-16 (×3): 25 mg via INTRAMUSCULAR

## 2019-03-16 MED ORDER — OXYCODONE HCL 5 MG/5ML PO SOLN: 5.0000 mg | Freq: Once | ORAL | Status: DC | PRN

## 2019-03-16 MED ORDER — FAMOTIDINE 20 MG PO TABS
ORAL_TABLET | ORAL | Status: AC
  Filled 2019-03-16: qty 1

## 2019-03-16 MED ORDER — ONDANSETRON HCL 4 MG/2ML IJ SOLN
INTRAMUSCULAR | Status: AC
  Filled 2019-03-16: qty 2

## 2019-03-16 MED ORDER — MIDAZOLAM HCL 2 MG/2ML IJ SOLN
INTRAMUSCULAR | Status: AC
  Filled 2019-03-16: qty 2

## 2019-03-16 MED ORDER — ACETAMINOPHEN 10 MG/ML IV SOLN
INTRAVENOUS | Status: AC
  Filled 2019-03-16: qty 100

## 2019-03-16 MED ORDER — DEXAMETHASONE SODIUM PHOSPHATE 10 MG/ML IJ SOLN
INTRAMUSCULAR | Status: DC | PRN
  Administered 2019-03-16: 10 mg via INTRAVENOUS

## 2019-03-16 MED ORDER — BUPIVACAINE LIPOSOME 1.3 % IJ SUSP
INTRAMUSCULAR | Status: AC
  Filled 2019-03-16: qty 20

## 2019-03-16 MED ORDER — TRAZODONE HCL 100 MG PO TABS
300.0000 mg | ORAL_TABLET | Freq: Every day | ORAL | Status: DC
  Administered 2019-03-16 – 2019-03-17 (×2): 300 mg via ORAL
  Filled 2019-03-16 (×2): qty 3

## 2019-03-16 MED ORDER — PHENYLEPHRINE HCL (PRESSORS) 10 MG/ML IV SOLN
INTRAVENOUS | Status: DC | PRN
  Administered 2019-03-16: 200 ug via INTRAVENOUS

## 2019-03-16 MED ORDER — CYCLOBENZAPRINE HCL 10 MG PO TABS
5.0000 mg | ORAL_TABLET | Freq: Three times a day (TID) | ORAL | Status: DC | PRN
  Administered 2019-03-17: 5 mg via ORAL
  Filled 2019-03-16: qty 1

## 2019-03-16 MED ORDER — PHENYLEPHRINE HCL (PRESSORS) 10 MG/ML IV SOLN
INTRAVENOUS | Status: AC
  Filled 2019-03-16: qty 1

## 2019-03-16 MED ORDER — DEXAMETHASONE SODIUM PHOSPHATE 10 MG/ML IJ SOLN
INTRAMUSCULAR | Status: AC
  Filled 2019-03-16: qty 1

## 2019-03-16 MED ORDER — FENTANYL CITRATE (PF) 100 MCG/2ML IJ SOLN
25.0000 ug | INTRAMUSCULAR | Status: DC | PRN
  Administered 2019-03-16 (×4): 25 ug via INTRAVENOUS

## 2019-03-16 MED ORDER — TRANEXAMIC ACID-NACL 1000-0.7 MG/100ML-% IV SOLN
1000.0000 mg | Freq: Once | INTRAVENOUS | Status: AC
  Administered 2019-03-16: 1000 mg via INTRAVENOUS
  Filled 2019-03-16: qty 100

## 2019-03-16 MED ORDER — MENTHOL 3 MG MT LOZG
1.0000 | LOZENGE | OROMUCOSAL | Status: DC | PRN
  Filled 2019-03-16: qty 9

## 2019-03-16 MED ORDER — LIDOCAINE HCL (CARDIAC) PF 100 MG/5ML IV SOSY
PREFILLED_SYRINGE | INTRAVENOUS | Status: DC | PRN
  Administered 2019-03-16: 60 mg via INTRAVENOUS

## 2019-03-16 MED ORDER — TRAMADOL HCL 50 MG PO TABS
50.0000 mg | ORAL_TABLET | ORAL | Status: DC | PRN
  Administered 2019-03-16: 50 mg via ORAL
  Administered 2019-03-16 – 2019-03-17 (×4): 100 mg via ORAL
  Administered 2019-03-18: 50 mg via ORAL
  Filled 2019-03-16 (×3): qty 2
  Filled 2019-03-16: qty 1
  Filled 2019-03-16: qty 2
  Filled 2019-03-16: qty 1

## 2019-03-16 MED ORDER — ROCURONIUM BROMIDE 50 MG/5ML IV SOLN
INTRAVENOUS | Status: AC
  Filled 2019-03-16: qty 1

## 2019-03-16 MED ORDER — CELECOXIB 200 MG PO CAPS
200.0000 mg | ORAL_CAPSULE | Freq: Two times a day (BID) | ORAL | Status: DC
  Administered 2019-03-16 – 2019-03-18 (×4): 200 mg via ORAL
  Filled 2019-03-16 (×5): qty 1

## 2019-03-16 MED ORDER — PROPOFOL 500 MG/50ML IV EMUL
INTRAVENOUS | Status: AC
  Filled 2019-03-16: qty 50

## 2019-03-16 MED ORDER — MAGNESIUM HYDROXIDE 400 MG/5ML PO SUSP
30.0000 mL | Freq: Every day | ORAL | Status: DC
  Administered 2019-03-17: 10:00:00 30 mL via ORAL
  Filled 2019-03-16 (×2): qty 30

## 2019-03-16 MED ORDER — METOCLOPRAMIDE HCL 10 MG PO TABS
5.0000 mg | ORAL_TABLET | Freq: Three times a day (TID) | ORAL | Status: DC | PRN
  Administered 2019-03-16 – 2019-03-17 (×3): 10 mg via ORAL

## 2019-03-16 MED ORDER — ENSURE ENLIVE PO LIQD: 296.0000 mL | Freq: Once | ORAL | Status: DC

## 2019-03-16 MED ORDER — FENTANYL CITRATE (PF) 100 MCG/2ML IJ SOLN
INTRAMUSCULAR | Status: DC | PRN
  Administered 2019-03-16 (×2): 50 ug via INTRAVENOUS

## 2019-03-16 MED ORDER — GLYCOPYRROLATE 0.2 MG/ML IJ SOLN
INTRAMUSCULAR | Status: DC | PRN
  Administered 2019-03-16: 0.1 mg via INTRAVENOUS

## 2019-03-16 MED ORDER — FENTANYL CITRATE (PF) 100 MCG/2ML IJ SOLN
INTRAMUSCULAR | Status: AC
  Filled 2019-03-16: qty 2

## 2019-03-16 MED ORDER — BISACODYL 10 MG RE SUPP
10.0000 mg | Freq: Every day | RECTAL | Status: DC | PRN
  Administered 2019-03-18: 10 mg via RECTAL
  Filled 2019-03-16: qty 1

## 2019-03-16 MED ORDER — DEXAMETHASONE SODIUM PHOSPHATE 10 MG/ML IJ SOLN
8.0000 mg | Freq: Once | INTRAMUSCULAR | Status: AC
  Administered 2019-03-16: 07:00:00 8 mg via INTRAVENOUS

## 2019-03-16 MED ORDER — METOCLOPRAMIDE HCL 10 MG PO TABS
10.0000 mg | ORAL_TABLET | Freq: Three times a day (TID) | ORAL | Status: AC
  Administered 2019-03-16 – 2019-03-18 (×7): 10 mg via ORAL
  Filled 2019-03-16 (×8): qty 1

## 2019-03-16 MED ORDER — ROCURONIUM BROMIDE 100 MG/10ML IV SOLN
INTRAVENOUS | Status: DC | PRN
  Administered 2019-03-16: 20 mg via INTRAVENOUS
  Administered 2019-03-16: 10 mg via INTRAVENOUS
  Administered 2019-03-16: 40 mg via INTRAVENOUS
  Administered 2019-03-16: 20 mg via INTRAVENOUS

## 2019-03-16 MED ORDER — CELECOXIB 200 MG PO CAPS
ORAL_CAPSULE | ORAL | Status: AC
Start: 2019-03-16 — End: 2019-03-16
  Filled 2019-03-16: qty 2

## 2019-03-16 MED ORDER — VENLAFAXINE HCL ER 150 MG PO CP24
300.0000 mg | ORAL_CAPSULE | Freq: Every day | ORAL | Status: DC
  Administered 2019-03-17 – 2019-03-18 (×2): 300 mg via ORAL
  Filled 2019-03-16 (×2): qty 2

## 2019-03-16 MED ORDER — LIDOCAINE HCL (PF) 2 % IJ SOLN
INTRAMUSCULAR | Status: AC
  Filled 2019-03-16: qty 10

## 2019-03-16 MED ORDER — GABAPENTIN 300 MG PO CAPS
900.0000 mg | ORAL_CAPSULE | Freq: Once | ORAL | Status: AC
  Administered 2019-03-16: 900 mg via ORAL

## 2019-03-16 MED ORDER — BUPIVACAINE HCL (PF) 0.25 % IJ SOLN
INTRAMUSCULAR | Status: AC
  Filled 2019-03-16: qty 60

## 2019-03-16 MED ORDER — OXYCODONE HCL 5 MG PO TABS: 5.0000 mg | ORAL_TABLET | Freq: Once | ORAL | Status: DC | PRN

## 2019-03-16 MED ORDER — AMANTADINE HCL 100 MG PO CAPS
200.0000 mg | ORAL_CAPSULE | Freq: Two times a day (BID) | ORAL | Status: DC
  Administered 2019-03-16 – 2019-03-18 (×4): 200 mg via ORAL
  Filled 2019-03-16 (×5): qty 2

## 2019-03-16 MED ORDER — HYDROMORPHONE HCL 1 MG/ML IJ SOLN
INTRAMUSCULAR | Status: AC
  Filled 2019-03-16: qty 1

## 2019-03-16 MED ORDER — CLONAZEPAM 0.5 MG PO TABS
0.5000 mg | ORAL_TABLET | Freq: Two times a day (BID) | ORAL | Status: DC | PRN
  Administered 2019-03-16 – 2019-03-17 (×3): 0.5 mg via ORAL
  Filled 2019-03-16 (×3): qty 1

## 2019-03-16 MED ORDER — PROPOFOL 10 MG/ML IV BOLUS
INTRAVENOUS | Status: DC | PRN
  Administered 2019-03-16: 80 mg via INTRAVENOUS

## 2019-03-16 MED ORDER — ONDANSETRON HCL 4 MG/2ML IJ SOLN
INTRAMUSCULAR | Status: DC | PRN
  Administered 2019-03-16: 4 mg via INTRAVENOUS

## 2019-03-16 MED ORDER — HYDROMORPHONE HCL 1 MG/ML IJ SOLN
INTRAMUSCULAR | Status: DC | PRN
  Administered 2019-03-16 (×2): .2 mg via INTRAVENOUS

## 2019-03-16 MED ORDER — CARBIDOPA-LEVODOPA 25-100 MG PO TABS
2.0000 | ORAL_TABLET | Freq: Three times a day (TID) | ORAL | Status: DC
  Administered 2019-03-16 – 2019-03-18 (×6): 2 via ORAL
  Filled 2019-03-16 (×8): qty 2

## 2019-03-16 MED ORDER — OXYCODONE HCL 5 MG PO TABS
5.0000 mg | ORAL_TABLET | ORAL | Status: DC | PRN
  Administered 2019-03-16 – 2019-03-17 (×3): 5 mg via ORAL
  Filled 2019-03-16 (×3): qty 1

## 2019-03-16 MED ORDER — FLEET ENEMA 7-19 GM/118ML RE ENEM: 1.0000 | ENEMA | Freq: Once | RECTAL | Status: DC | PRN

## 2019-03-16 MED ORDER — METOCLOPRAMIDE HCL 5 MG/ML IJ SOLN: 5.0000 mg | Freq: Three times a day (TID) | INTRAMUSCULAR | Status: DC | PRN

## 2019-03-16 MED ORDER — ACETAMINOPHEN 325 MG PO TABS: 325.0000 mg | ORAL_TABLET | Freq: Four times a day (QID) | ORAL | Status: DC | PRN

## 2019-03-16 MED ORDER — HYDROCHLOROTHIAZIDE 25 MG PO TABS
25.0000 mg | ORAL_TABLET | Freq: Every day | ORAL | Status: DC
  Filled 2019-03-16 (×2): qty 1

## 2019-03-16 MED ORDER — PHENOL 1.4 % MT LIQD
1.0000 | OROMUCOSAL | Status: DC | PRN
  Filled 2019-03-16: qty 177

## 2019-03-16 MED ORDER — GABAPENTIN 300 MG PO CAPS
ORAL_CAPSULE | ORAL | Status: AC
  Filled 2019-03-16: qty 3

## 2019-03-16 MED ORDER — LACTATED RINGERS IV SOLN
INTRAVENOUS | Status: DC
  Administered 2019-03-16: 07:00:00 via INTRAVENOUS

## 2019-03-16 MED ORDER — PANTOPRAZOLE SODIUM 40 MG PO TBEC
40.0000 mg | DELAYED_RELEASE_TABLET | Freq: Two times a day (BID) | ORAL | Status: DC
  Administered 2019-03-16 – 2019-03-18 (×5): 40 mg via ORAL
  Filled 2019-03-16 (×5): qty 1

## 2019-03-16 MED ORDER — CALCIUM CARBONATE ANTACID 500 MG PO CHEW
600.0000 mg | CHEWABLE_TABLET | Freq: Every day | ORAL | Status: DC
  Administered 2019-03-17 – 2019-03-18 (×2): 600 mg via ORAL
  Filled 2019-03-16 (×2): qty 3

## 2019-03-16 MED ORDER — FENTANYL CITRATE (PF) 100 MCG/2ML IJ SOLN
INTRAMUSCULAR | Status: AC
  Administered 2019-03-16: 11:00:00 25 ug via INTRAVENOUS
  Filled 2019-03-16: qty 2

## 2019-03-16 MED ORDER — GABAPENTIN 300 MG PO CAPS
900.0000 mg | ORAL_CAPSULE | Freq: Three times a day (TID) | ORAL | Status: DC
  Administered 2019-03-16 – 2019-03-18 (×6): 900 mg via ORAL
  Filled 2019-03-16 (×6): qty 3

## 2019-03-16 MED ORDER — EPHEDRINE SULFATE 50 MG/ML IJ SOLN
INTRAMUSCULAR | Status: AC
  Filled 2019-03-16: qty 1

## 2019-03-16 MED ORDER — PROPRANOLOL HCL 20 MG PO TABS
20.0000 mg | ORAL_TABLET | Freq: Three times a day (TID) | ORAL | Status: DC
  Administered 2019-03-16 – 2019-03-18 (×4): 20 mg via ORAL
  Filled 2019-03-16 (×4): qty 1

## 2019-03-16 MED ORDER — ACETAMINOPHEN 10 MG/ML IV SOLN
1000.0000 mg | Freq: Four times a day (QID) | INTRAVENOUS | Status: AC
  Administered 2019-03-16 – 2019-03-17 (×4): 1000 mg via INTRAVENOUS
  Filled 2019-03-16 (×4): qty 100

## 2019-03-16 MED ORDER — ROCURONIUM BROMIDE 50 MG/5ML IV SOLN
INTRAVENOUS | Status: AC
Start: 2019-03-16 — End: ?
  Filled 2019-03-16: qty 1

## 2019-03-16 MED ORDER — OXYCODONE HCL 5 MG PO TABS
10.0000 mg | ORAL_TABLET | ORAL | Status: DC | PRN
  Administered 2019-03-16 – 2019-03-18 (×2): 10 mg via ORAL
  Filled 2019-03-16 (×2): qty 2

## 2019-03-16 MED ORDER — CHLORHEXIDINE GLUCONATE CLOTH 2 % EX PADS
6.0000 | MEDICATED_PAD | Freq: Every day | CUTANEOUS | Status: DC
  Administered 2019-03-16: 6 via TOPICAL

## 2019-03-16 MED ORDER — ENOXAPARIN SODIUM 30 MG/0.3ML ~~LOC~~ SOLN
30.0000 mg | Freq: Two times a day (BID) | SUBCUTANEOUS | Status: DC
  Administered 2019-03-17 – 2019-03-18 (×3): 30 mg via SUBCUTANEOUS
  Filled 2019-03-16 (×3): qty 0.3

## 2019-03-16 MED ORDER — HYDROMORPHONE HCL 1 MG/ML IJ SOLN: 0.5000 mg | INTRAMUSCULAR | Status: DC | PRN

## 2019-03-16 MED ORDER — ACETAMINOPHEN 10 MG/ML IV SOLN
INTRAVENOUS | Status: DC | PRN
  Administered 2019-03-16: 1000 mg via INTRAVENOUS

## 2019-03-16 MED ORDER — SODIUM CHLORIDE 0.9 % IV SOLN
INTRAVENOUS | Status: DC | PRN
  Administered 2019-03-16: 08:00:00 50 ug/min via INTRAVENOUS

## 2019-03-16 MED ORDER — NEOMYCIN-POLYMYXIN B GU 40-200000 IR SOLN
Status: DC | PRN
  Administered 2019-03-16: 14 mL

## 2019-03-16 MED ORDER — ONDANSETRON HCL 4 MG PO TABS: 4.0000 mg | ORAL_TABLET | Freq: Four times a day (QID) | ORAL | Status: DC | PRN

## 2019-03-16 MED ORDER — CLINDAMYCIN PHOSPHATE 900 MG/50ML IV SOLN
900.0000 mg | INTRAVENOUS | Status: AC
  Administered 2019-03-16: 900 mg via INTRAVENOUS

## 2019-03-16 SURGICAL SUPPLY — 58 items
BALL HIP ARTICU 28 +5 (Hips) ×1 IMPLANT
CANISTER SUCT 1200ML W/VALVE (MISCELLANEOUS) ×3 IMPLANT
CANISTER SUCT 3000ML PPV (MISCELLANEOUS) ×6 IMPLANT
COVER BACK TABLE REUSABLE LG (DRAPES) ×3 IMPLANT
COVER WAND RF STERILE (DRAPES) ×3 IMPLANT
DRAPE 3/4 80X56 (DRAPES) ×6 IMPLANT
DRAPE INCISE IOBAN 66X45 STRL (DRAPES) ×3 IMPLANT
DRAPE INCISE IOBAN 66X60 STRL (DRAPES) ×3 IMPLANT
DRSG DERMACEA 8X12 NADH (GAUZE/BANDAGES/DRESSINGS) ×3 IMPLANT
DRSG OPSITE POSTOP 4X12 (GAUZE/BANDAGES/DRESSINGS) ×3 IMPLANT
DRSG OPSITE POSTOP 4X14 (GAUZE/BANDAGES/DRESSINGS) ×3 IMPLANT
DRSG TEGADERM 4X4.75 (GAUZE/BANDAGES/DRESSINGS) ×3 IMPLANT
DURAPREP 26ML APPLICATOR (WOUND CARE) ×3 IMPLANT
GAUZE SPONGE 4X4 12PLY STRL (GAUZE/BANDAGES/DRESSINGS) ×3 IMPLANT
GLOVE BIOGEL M STRL SZ7.5 (GLOVE) ×6 IMPLANT
GLOVE BIOGEL PI IND STRL 7.5 (GLOVE) ×1 IMPLANT
GLOVE BIOGEL PI INDICATOR 7.5 (GLOVE) ×2
GLOVE INDICATOR 8.0 STRL GRN (GLOVE) ×3 IMPLANT
GLOVE ORTHO TXT STRL SZ7.5 (GLOVE) ×6 IMPLANT
GOWN STRL REUS W/ TWL LRG LVL3 (GOWN DISPOSABLE) ×2 IMPLANT
GOWN STRL REUS W/TWL LRG LVL3 (GOWN DISPOSABLE) ×4
HANDPIECE VERSAJET DEBRIDEMENT (MISCELLANEOUS) IMPLANT
HEMOVAC 400CC 10FR (MISCELLANEOUS) ×3 IMPLANT
HIP BALL ARTICU 28 +5 (Hips) ×3 IMPLANT
HOLDER FOLEY CATH W/STRAP (MISCELLANEOUS) ×3 IMPLANT
HOOD PEEL AWAY FLYTE STAYCOOL (MISCELLANEOUS) ×6 IMPLANT
IRRIGATION STRYKERFLOW (MISCELLANEOUS) ×1 IMPLANT
IRRIGATOR STRYKERFLOW (MISCELLANEOUS) ×3
IV NS 100ML SINGLE PACK (IV SOLUTION) ×3 IMPLANT
LINER ACETABULAR 28MM +4 ESCON (Hips) ×3 IMPLANT
MANIFOLD NEPTUNE II (INSTRUMENTS) ×3 IMPLANT
NDL SAFETY ECLIPSE 18X1.5 (NEEDLE) ×1 IMPLANT
NEEDLE FILTER BLUNT 18X 1/2SAF (NEEDLE) ×2
NEEDLE FILTER BLUNT 18X1 1/2 (NEEDLE) ×1 IMPLANT
NEEDLE HYPO 18GX1.5 SHARP (NEEDLE) ×2
NS IRRIG 1000ML POUR BTL (IV SOLUTION) ×3 IMPLANT
PACK HIP PROSTHESIS (MISCELLANEOUS) ×3 IMPLANT
PENCIL SMOKE ULTRAEVAC 22 CON (MISCELLANEOUS) ×3 IMPLANT
PULSAVAC PLUS IRRIG FAN TIP (DISPOSABLE) ×3
SOL .9 NS 3000ML IRR  AL (IV SOLUTION) ×2
SOL .9 NS 3000ML IRR UROMATIC (IV SOLUTION) ×1 IMPLANT
SPONGE DRAIN TRACH 4X4 STRL 2S (GAUZE/BANDAGES/DRESSINGS) ×3 IMPLANT
STAPLER SKIN PROX 35W (STAPLE) ×3 IMPLANT
STRAP SAFETY 5IN WIDE (MISCELLANEOUS) ×3 IMPLANT
SUCTION FRAZIER HANDLE 10FR (MISCELLANEOUS) ×2
SUCTION TUBE FRAZIER 10FR DISP (MISCELLANEOUS) ×1 IMPLANT
SUT ETHIBOND #5 BRAIDED 30INL (SUTURE) ×3 IMPLANT
SUT VIC AB 0 CT1 36 (SUTURE) ×3 IMPLANT
SUT VIC AB 1 CT1 36 (SUTURE) ×6 IMPLANT
SUT VIC AB 2-0 CT1 27 (SUTURE) ×2
SUT VIC AB 2-0 CT1 TAPERPNT 27 (SUTURE) ×1 IMPLANT
SYR 20ML LL LF (SYRINGE) ×3 IMPLANT
TAPE CLOTH 3X10 WHT NS LF (GAUZE/BANDAGES/DRESSINGS) ×3 IMPLANT
TAPE TRANSPORE STRL 2 31045 (GAUZE/BANDAGES/DRESSINGS) ×3 IMPLANT
TIP BRUSH PULSAVAC PLUS 24.33 (MISCELLANEOUS) IMPLANT
TIP FAN IRRIG PULSAVAC PLUS (DISPOSABLE) ×1 IMPLANT
TOWEL OR 17X26 4PK STRL BLUE (TOWEL DISPOSABLE) ×6 IMPLANT
TRAY FOLEY MTR SLVR 16FR STAT (SET/KITS/TRAYS/PACK) ×3 IMPLANT

## 2019-03-16 NOTE — Evaluation (Signed)
Physical Therapy Evaluation Patient Details Name: [name] MRN: [id number] DOB: August 12, 1955 Today's Date: 03/16/2019   History of Present Illness  Pt is a 63 yo female diagnosed with Recurrent dislocation of the left total hip arthroplasty and is s/p Left total hip revision with conversion to a constrained liner.  PMH includes LUE amputation, PD, HTN, asthma, bipolar disorder, anxiety, EtOH abuse, depression, neuropathy, lumbar Sx, and R THA.    Clinical Impression  Pt presented with deficits in strength, transfers, mobility, gait, balance, and activity tolerance.  Pt required min-mod A with bed mobility tasks for LLE in/out of bed.  Pt was able to stand from an elevated EOB with min A for stability and verbal cues for sequencing for hip precaution compliance.  Pt typically ambulates with a loftstrand crutch but was unable to don one correctly during the session secondary to multiple IV lines in the RUE.  With HHA pt was able to do several standing LLE marches but was unable to lift the RLE from the floor or ambulate. Nursing notified of of IV issue and stated they would attempt to take IV line at the elbow out secondary to it not being in use.  Will attempt to progress ambulation next session as pt is able.  Will recommend HHPT at this time to address the above deficits secondary to expectation that pt will make good progress while in acute care especially once she is able to don a loftstrand crutch.     Follow Up Recommendations Home health PT;Supervision for mobility/OOB    Equipment Recommendations  None recommended by PT    Recommendations for Other Services       Precautions / Restrictions Precautions Precautions: Posterior Hip Precaution Booklet Issued: Yes (comment) Restrictions Weight Bearing Restrictions: Yes LLE Weight Bearing: Weight bearing as tolerated      Mobility  Bed Mobility Overal bed mobility: Needs Assistance Bed Mobility: Supine to Sit;Sit to Supine      Supine to sit: Min assist Sit to supine: Mod assist   General bed mobility comments: Min to Mod A for LLE support in/out of bed  Transfers Overall transfer level: Needs assistance Equipment used: 1 person hand held assist Transfers: Sit to/from Stand Sit to Stand: Min assist         General transfer comment: Min A for stability with transfers along with cues for proper sequencing for hip precaution compliance  Ambulation/Gait             General Gait Details: Unable to use loftstrand crutch during the session secondary to multiple IV lines in the LUE with pt unable to amb without it  Science writer    Modified Rankin (Stroke Patients Only)       Balance Overall balance assessment: Needs assistance Sitting-balance support: Single extremity supported;Feet supported Sitting balance-Leahy Scale: Good     Standing balance support: Single extremity supported Standing balance-Leahy Scale: Poor Standing balance comment: Min A for stability in standing                             Pertinent Vitals/Pain Pain Assessment: 0-10 Pain Score: 6  Pain Location: L hip Pain Descriptors / Indicators: Aching;Sore Pain Intervention(s): Premedicated before session;Monitored during session;Limited activity within patient's tolerance    Home Living Family/patient expects to be discharged to:: Private residence Living Arrangements: Alone Available Help at Discharge: Friend(s);Available PRN/intermittently;Personal  care attendant Type of Home: Apartment Home Access: Stairs to enter Entrance Stairs-Rails: Right;Left;Can reach both Entrance Stairs-Number of Steps: 3 Home Layout: One level Home Equipment: Bedside commode;Other (comment)(Loftstrand crutches)      Prior Function Level of Independence: Needs assistance   Gait / Transfers Assistance Needed: Pt Mod Ind with Amb with loftstrand crutch with 5 falls in the last year mostly secondary  to L hip instability, Ind with bed mobility and transfers  ADL's / Homemaking Assistance Needed: Pt has a PCA 2.5 hrs/day, 7 days/wk who assists as needed including with housework, bathing, and dressing        Hand Dominance   Dominant Hand: Right    Extremity/Trunk Assessment   Upper Extremity Assessment Upper Extremity Assessment: Overall WFL for tasks assessed;Defer to OT evaluation    Lower Extremity Assessment Lower Extremity Assessment: Generalized weakness;LLE deficits/detail LLE Deficits / Details: B ankle DF/PF strength and sensation to light touch intact LLE: Unable to fully assess due to pain LLE Sensation: WNL       Communication   Communication: No difficulties  Cognition Arousal/Alertness: Awake/alert Behavior During Therapy: WFL for tasks assessed/performed Overall Cognitive Status: Within Functional Limits for tasks assessed                                        General Comments      Exercises Total Joint Exercises Ankle Circles/Pumps: Strengthening;AROM;Both;5 reps;10 reps Quad Sets: Strengthening;Both;10 reps Gluteal Sets: Strengthening;Both;10 reps Hip ABduction/ADduction: AROM;AAROM;Both;10 reps;Other (comment)(AAROM on the LLE) Long Arc Quad: AROM;Strengthening;Both;10 reps Knee Flexion: AROM;Strengthening;Both;10 reps Marching in Standing: AROM;Left;5 reps;Standing Other Exercises Other Exercises: Posterior hip precaution education with pt able to recall 2/3 precautions independently Other Exercises: HEP education for BLE APs, QS, and GS x 10 each 5-6x/day   Assessment/Plan    PT Assessment Patient needs continued PT services  PT Problem List Decreased strength;Decreased activity tolerance;Decreased balance;Decreased mobility;Decreased knowledge of precautions       PT Treatment Interventions DME instruction;Gait training;Stair training;Functional mobility training;Therapeutic activities;Therapeutic exercise;Balance  training;Patient/family education    PT Goals (Current goals can be found in the Care Plan section)  Acute Rehab PT Goals Patient Stated Goal: To get stronger PT Goal Formulation: With patient Time For Goal Achievement: 03/29/19 Potential to Achieve Goals: Good    Frequency BID   Barriers to discharge        Co-evaluation               AM-PAC PT "6 Clicks" Mobility  Outcome Measure Help needed turning from your back to your side while in a flat bed without using bedrails?: A Lot Help needed moving from lying on your back to sitting on the side of a flat bed without using bedrails?: A Lot Help needed moving to and from a bed to a chair (including a wheelchair)?: A Lot Help needed standing up from a chair using your arms (e.g., wheelchair or bedside chair)?: A Little Help needed to walk in hospital room?: Total Help needed climbing 3-5 steps with a railing? : Total 6 Click Score: 11    End of Session Equipment Utilized During Treatment: Gait belt Activity Tolerance: Patient tolerated treatment well Patient left: in bed;with call bell/phone within reach;with bed alarm set;Other (comment);with SCD's reapplied;with nursing/sitter in room(Ice to left hip, abd pillows in place; pt declined up in chair) Nurse Communication: Mobility status PT Visit Diagnosis: Unsteadiness on  feet (R26.81);History of falling (Z91.81);Other abnormalities of gait and mobility (R26.89);Muscle weakness (generalized) (M62.81)    Time: 1540-1620 PT Time Calculation (min) (ACUTE ONLY): 40 min   Charges:   PT Evaluation $PT Eval Moderate Complexity: 1 Mod PT Treatments $Therapeutic Exercise: 8-22 mins        D. Royetta Asal PT, DPT 03/16/19, 4:49 PM

## 2019-03-16 NOTE — Op Note (Signed)
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   ASSISTANT: Vance Peper, PA  ANESTHESIA: general  ESTIMATED BLOOD LOSS: 50 mL  FLUIDS REPLACED: 1000 mL of crystalloid  DRAINS: 2 medium Hemovac drains  IMPLANTS UTILIZED: Depuy +4 mm neutral constrained polyethylene liner with locking ring, 28 mm cobalt chrome hip ball with a +5 mm neck length.  INDICATIONS FOR SURGERY: [name] is a 63 y.o. year old female who has multiple episodes of posterior dislocation of the left total hip arthroplasty.  She does have a known history of Parkinson's disease with some dyskinesias. After discussion of the risks and benefits of surgical intervention, the patient expressed understanding of the risks benefits and agree with plans for revision of the left total hip arthroplasty and conversion to a constrained liner.   PROCEDURE IN DETAIL: The patient was brought into the operating room and, after adequate general endotracheal anesthesia was achieved, the patient was placed in a right lateral decubitus position.  Axillary roll was placed and all bony prominences were well-padded.  The patient's left hip and leg were cleaned and prepped with alcohol and DuraPrep and draped in usual sterile fashion.  A "timeout" was performed as per usual protocol.  A lateral curvilinear incision was made in line with the previous surgical scar.  Dissection was carried down and the IT band was incised in line with the skin incision and the fibers of the gluteus maximus were split in line.  There was a slight hemarthrosis noted.  The previous repair of the posterior capsule was intact, although the short external rotators were retracted.   A T-type posterior capsulotomy was performed and the corners were tagged with #5 Ethibond.  The femoral head was then placed in range of motion actually good stability was appreciated including flexion and abduction with internal rotation of approximately 80 degrees.  The femoral head was dislocated posteriorly.  A bone tamp was used to tap the femoral ball off of the trunnion.  The trunnion was inspected and noted to be pristine.  Next, soft tissue was debrided from around the collar and proximal portion of the femoral component.  There was no evidence of motion with torquing of the implant.  Next, attention was directed to the acetabulum.  Soft tissue was debrided using electrocautery so as to visualize around the periphery of the acetabular component.  A drill hole was created in the polyethylene and a 6.5 millimeters screw was used to disengage the locking mechanism for the polyethylene.  The backside of the acetabulum component was in good condition.  There was good contact with bone is noted at the area of the apex hole.  Next, an angled curette was used to further debride soft tissue from around the periphery of the implant.  There was no apparent motion at the bone implant interface.  There was good anteversion and inclination of the acetabular component.  A trial reduction was performed using a +4 mm neutral polyethylene liner with a 28 mm head and a +5 mm neck length.  Good stability was appreciated.  The trial components removed.  The acetabular region was irrigated with copious amounts of normal saline with antibiotic solution.  A +4  mm neutral constrained polyethylene insert was positioned and impacted into place.  Next, the metal locking ring was placed around the femoral neck and the 28 mm cobalt chrome hip ball with a +5 mm neck segment was placed on the cleaned trunion and impacted in place.  The hip was then reduced.  Pressure was applied and there was an audible click when the femoral head engaged  into the constrained liner.  Next, the metal locking ring was positioned around the polyethylene and tapped into place, thus locking into position.  The locking ring was visualized around its periphery using a tonsil and noted to be fully engaged.  The hip was again placed range of motion with excellent stability appreciated.  The wound was irrigated with copious amounts of normal saline with a antibiotic solution using pulsatile lavage and suctioned dry.  Good hemostasis was appreciated.  The posterior capsulotomy was repaired using #5 Ethibond.  2 medium drains were placed wound bed and brought through separate stab incision be attached to a Hemovac drain.  The IT band was repaired using interrupted sutures of #1 Vicryl.  Subcutaneous tissue was approximate layers using first #0 Vicryl followed #2-0 Vicryl.  The skin was closed with skin staples.  A sterile dressing was applied.  The patient tolerated the procedure well.  She was transported to the recovery room in stable condition.   [name] P. Holley Bouche M.D.

## 2019-03-16 NOTE — Transfer of Care (Signed)
Immediate Anesthesia Transfer of Care Note  Patient: [name]  Procedure(s) Performed: TOTAL HIP REVISION ARTHROPLASTY WITH CONVERSION TO A CONSTRAINED LINER. (Left Hip)  Patient Location: PACU  Anesthesia Type:General  Level of Consciousness: sedated  Airway & Oxygen Therapy: Patient Spontanous Breathing and Patient connected to face mask oxygen  Post-op Assessment: Report given to RN and Post -op Vital signs reviewed and stable  Post vital signs: Reviewed and stable  Last Vitals:  Vitals Value Taken Time  BP 99/42 03/16/19 1031  Temp 36.3 C 03/16/19 1031  Pulse 65 03/16/19 1033  Resp 11 03/16/19 1033  SpO2 99 % 03/16/19 1033  Vitals shown include unvalidated device data.  Last Pain:  Vitals:   03/16/19 0628  TempSrc: Oral  PainSc: 6          Complications: No apparent anesthesia complications

## 2019-03-16 NOTE — Anesthesia Post-op Follow-up Note (Signed)
Anesthesia QCDR form completed.        

## 2019-03-16 NOTE — Anesthesia Procedure Notes (Signed)
Procedure Name: Intubation Date/Time: 03/16/2019 7:31 AM Performed by: Caryl Asp, CRNA Pre-anesthesia Checklist: Patient identified, Patient being monitored, Timeout performed, Emergency Drugs available and Suction available Patient Re-evaluated:Patient Re-evaluated prior to induction Oxygen Delivery Method: Circle system utilized Preoxygenation: Pre-oxygenation with 100% oxygen Induction Type: IV induction Ventilation: Mask ventilation without difficulty Laryngoscope Size: 3 and McGraph Grade View: Grade I Tube type: Oral Tube size: 7.0 mm Number of attempts: 2 Airway Equipment and Method: Stylet and Video-laryngoscopy Placement Confirmation: ETT inserted through vocal cords under direct vision,  positive ETCO2 and breath sounds checked- equal and bilateral Secured at: 22 cm Tube secured with: Tape Dental Injury: Teeth and Oropharynx as per pre-operative assessment  Difficulty Due To: Difficulty was unanticipated and Difficult Airway- due to anterior larynx

## 2019-03-16 NOTE — TOC Progression Note (Signed)
Transition of Care [facility]) - Progression Note    Patient Details  Name: [name] MRN: [id number] Date of Birth: April 25, 1956  [facility] CM/SW Contact  Annamay Laymon, Lenice Llamas Phone Number: [phone]  03/16/2019, 3:14 PM  Clinical Narrative: Lovenox price requested.          Expected Discharge Plan and Services           Expected Discharge Date: 03/18/19                                     Social Determinants of Health (SDOH) Interventions    Readmission Risk Interventions No flowsheet data found.

## 2019-03-16 NOTE — Progress Notes (Signed)
Patient c/o upper back pain, very tender to touch (area near center of shoulder blades), has seen PCP a few days ago and it was indicated muscular in nature and was ordered flexeril. Also has two scabbed areas on the upper left back from using a heating pad at home. Dr. Marry Guan was made aware and addressed the issue with the patient.

## 2019-03-16 NOTE — H&P (Signed)
The patient has been re-examined, and the chart reviewed, and there have been no interval changes to the documented history and physical.    The risks, benefits, and alternatives have been discussed at length. The patient expressed understanding of the risks benefits and agreed with plans for surgical intervention.  James P. Hooten, Jr. M.D.    

## 2019-03-16 NOTE — Progress Notes (Signed)
Pt is alert and oriented. In no acute distress. Does complain that pain control will not be adequate with her current ordered medications. RN educated pt on pain control measures, protocol, and blood presser parameters. Pt verbalizes understanding. Surgical site and hemovac intact. Pt has burn wound on L upper back she says is from a heat pad, foam applied. Pt has full sensation to LLE and can wiggle toes. Abduction pillows between legs. Will continue to monitor.

## 2019-03-16 NOTE — Anesthesia Preprocedure Evaluation (Addendum)
Anesthesia Evaluation  Patient identified by MRN, date of birth, ID band Patient awake    Reviewed: Allergy & Precautions, H&P , NPO status , Patient's Chart, lab work & pertinent test results  Airway Mallampati: II  TM Distance: >3 FB     Dental  (+) Chipped   Pulmonary asthma , Current Smoker,           Cardiovascular hypertension, (-) angina(-) Past MI, (-) Cardiac Stents and (-) CABG (-) dysrhythmias      Neuro/Psych PSYCHIATRIC DISORDERS Anxiety Depression Bipolar Disorder Parkinson's disease neuropathy    GI/Hepatic negative GI ROS, (+)     substance abuse  alcohol use,   Endo/Other  negative endocrine ROS  Renal/GU      Musculoskeletal   Abdominal   Peds  Hematology negative hematology ROS (+)   Anesthesia Other Findings Past Medical History: No date: Anxiety No date: Anxiety     Comment:  panic anxiety disorder No date: Arthritis No date: Asthma     Comment:  chronic asthmatic bronchitis No date: Bipolar disorder ([place]) No date: Cancer ([place])     Comment:  Desmoid tumor left forearm No date: Depression No date: ETOH abuse No date: Heart murmur No date: Insomnia No date: Neuropathy  Past Surgical History: No date: ABDOMINAL HYSTERECTOMY 2007: ABDOMINAL HYSTERECTOMY 1998: ARM AMPUTATION; Left     Comment:  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 No date: ARM AMPUTATION AT SHOULDER; Left No date: [place]: CESAREAN SECTION     Comment:  x 2 No date: COLONOSCOPY 04/27/2018: ESOPHAGOGASTRODUODENOSCOPY (EGD) WITH PROPOFOL; N/A     Comment:  Procedure: ESOPHAGOGASTRODUODENOSCOPY (EGD) WITH               PROPOFOL;  Surgeon: [place], Benay Pike, MD;  Location:               [facility] END[facility];  Service: Gastroenterology;  Laterality:               N/A; 2012: JOINT REPLACEMENT; Right     Comment:  hip 04/16/2016: LUMBAR LAMINECTOMY/DECOMPRESSION MICRODISCECTOMY; Left     Comment:  Procedure:  LUMBAR LAMINECTOMY/DECOMPRESSION               MICRODISCECTOMY 1 LEVEL;  Surgeon: Meade Maw, MD;              Location: [facility] ORS;  Service: Neurosurgery;  Laterality:               Left;  Left L4-5 far lateral discectomy, left L4-5               laminoforaminotomy 09/17/2015: TOTAL HIP ARTHROPLASTY; Left     Comment:  Procedure: TOTAL HIP ARTHROPLASTY;  Surgeon: Dereck Leep, MD;  Location: [facility] ORS;  Service: Orthopedics;                Laterality: Left; No date: WISDOM TOOTH EXTRACTION  BMI    Body Mass Index: 23.48 kg/m      Reproductive/Obstetrics negative OB ROS                            Anesthesia Physical Anesthesia Plan  ASA: III  Anesthesia Plan: General ETT   Post-op Pain Management:    Induction:   PONV Risk Score and Plan: Ondansetron, Dexamethasone, Midazolam and Treatment may vary due to age or medical  condition  Airway Management Planned:   Additional Equipment:   Intra-op Plan:   Post-operative Plan:   Informed Consent: I have reviewed the patients History and Physical, chart, labs and discussed the procedure including the risks, benefits and alternatives for the proposed anesthesia with the patient or authorized representative who has indicated his/her understanding and acceptance.     Dental Advisory Given  Plan Discussed with: Anesthesiologist, CRNA and Surgeon  Anesthesia Plan Comments:         Anesthesia Quick Evaluation

## 2019-03-16 NOTE — Progress Notes (Signed)
Pt presents with drug seeking behavior. Given PRN Klonopin and requested 3 hours later. Given 10mg  oxycodone for pain 8/10.  This Probation officer overheard pt on speaker phone with husband and husband stated to pt " If you don't need it now , you can put some up for later". This Probation officer made sure that pt swallowed her medication and will make oncoming shift aware. Before this writer could leave room states " I want my Klonopin or my propanolol in a hour".

## 2019-03-17 ENCOUNTER — Encounter: Payer: Self-pay | Admitting: Orthopedic Surgery

## 2019-03-17 MED ORDER — TRAMADOL HCL 50 MG PO TABS: 50.0000 mg | ORAL_TABLET | ORAL | 1 refills | Status: DC | PRN

## 2019-03-17 MED ORDER — OXYCODONE HCL 5 MG PO TABS: 5.0000 mg | ORAL_TABLET | ORAL | 0 refills | Status: DC | PRN

## 2019-03-17 MED ORDER — CELECOXIB 200 MG PO CAPS: 200.0000 mg | ORAL_CAPSULE | Freq: Two times a day (BID) | ORAL | 1 refills | Status: DC

## 2019-03-17 MED ORDER — ENOXAPARIN SODIUM 40 MG/0.4ML ~~LOC~~ SOLN: 40.0000 mg | SUBCUTANEOUS | 0 refills | Status: DC

## 2019-03-17 NOTE — Progress Notes (Signed)
  Subjective: 1 Day Post-Op Procedure(s) (LRB): TOTAL HIP REVISION ARTHROPLASTY WITH CONVERSION TO A CONSTRAINED LINER. (Left) Patient reports pain as moderate.  Her lumbar spine is feeling better. Patient seen in rounds with Dr. Marry Guan. Patient is well, and has had no acute complaints or problems Plan is to go Home after hospital stay. Negative for chest pain and shortness of breath Fever: no Gastrointestinal: Negative for nausea and vomiting  Objective: Vital signs in last 24 hours: Temp:  [97.3 F (36.3 C)-98.7 F (37.1 C)] 97.5 F (36.4 C) (09/24 0350) Pulse Rate:  [62-74] 68 (09/24 0350) Resp:  [12-19] 19 (09/24 0350) BP: (99-147)/(42-73) 147/73 (09/24 0350) SpO2:  [94 %-100 %] 98 % (09/24 0350) Weight:  [64 kg] 64 kg (09/23 0628)  Intake/Output from previous day:  Intake/Output Summary (Last 24 hours) at 03/17/2019 0604 Last data filed at 03/17/2019 0356 Gross per 24 hour  Intake 2512.17 ml  Output 1280 ml  Net 1232.17 ml    Intake/Output this shift: Total I/O In: 758.4 [I.V.:608.4; IV Piggyback:150] Out: 810 [Urine:750; Drains:60]  Labs: No results for input(s): HGB in the last 72 hours. No results for input(s): WBC, RBC, HCT, PLT in the last 72 hours. No results for input(s): NA, K, CL, CO2, BUN, CREATININE, GLUCOSE, CALCIUM in the last 72 hours. No results for input(s): LABPT, INR in the last 72 hours.   EXAM General - Patient is Alert and Oriented Extremity - Neurovascular intact Sensation intact distally Dorsiflexion/Plantar flexion intact Compartment soft Dressing/Incision - clean, dry, with the Hemovac intact Motor Function - intact, moving foot and toes well on exam.   Past Medical History:  Diagnosis Date  . Anxiety   . Anxiety    panic anxiety disorder  . Arthritis   . Asthma    chronic asthmatic bronchitis  . Bipolar disorder ([place])   . Cancer [facility])    Desmoid tumor left forearm  . Depression   . ETOH abuse   . Heart murmur   . Insomnia    . Neuropathy     Assessment/Plan: 1 Day Post-Op Procedure(s) (LRB): TOTAL HIP REVISION ARTHROPLASTY WITH CONVERSION TO A CONSTRAINED LINER. (Left) Active Problems:   S/P revision of total hip  Estimated body mass index is 23.48 kg/m as calculated from the following:   Height as of this encounter: 5\' 5"  (1.651 m).   Weight as of this encounter: 64 kg. Advance diet Up with therapy D/C IV fluids Discharge home with home health planned for tomorrow  DVT Prophylaxis - Lovenox, Foot Pumps and TED hose Weight-Bearing as tolerated to left leg  Reche Dixon, PA-C Orthopaedic Surgery 03/17/2019, 6:04 AM

## 2019-03-17 NOTE — TOC Initial Note (Signed)
Transition of Care [facility]) - Initial/Assessment Note    Patient Details  Name: [name] MRN: [id number] Date of Birth: January 05, 1956  [facility]) CM/SW Contact:    Su Hilt, RN Phone Number: 03/17/2019, 9:35 AM  Clinical Narrative:                 Spoke with the patient to discuss DC plan and needs She will go home at DC and has many friends to help her She has all the DME needed at home including Shower chair, Bed side commode, crutches, walker , she states she does not need additional DME She has always used [facility] and wants to use them for [facility] PT and OT I contacted Blanchard Mane with [facility] and left her a secure detailed VM and requested a call back top verify Once the price of Lovenox is obtained I will notify the patient  Expected Discharge Plan: [place] Barriers to Discharge: Continued Medical Work up   Patient Goals and CMS Choice Patient states their goals for this hospitalization and ongoing recovery are:: Go home CMS Medicare.gov Compare Post Acute Care list provided to:: Patient Choice offered to / list presented to : Patient  Expected Discharge Plan and Services Expected Discharge Plan: [place]   Discharge Planning Services: CM Consult Post Acute Care Choice: [place] arrangements for the past 2 months: Single Family Home Expected Discharge Date: 03/18/19               DME Arranged: N/A         HH Arranged: PT, OT HH Agency: [facility] Date [place]: 03/17/19 Time [place]: [id number] Representative spoke with at [place]: Blanchard Mane  Prior Living Arrangements/Services Living arrangements for the past 2 months: [place] with:: Friends Patient language and need for interpreter reviewed:: Yes Do you feel safe going back to the place where you live?: Yes      Need for Family Participation in Patient Care: No (Comment) Care giver  support system in place?: Yes (comment) Current home services: DME(Shower chair, Crutches, BSC, Walker) Criminal Activity/Legal Involvement Pertinent to Current Situation/Hospitalization: No - Comment as needed  Activities of Daily Living Home Assistive Devices/Equipment: Bedside commode/3-in-1, Blood pressure cuff, Built-in shower seat, Cane (specify quad or straight), Grab bars around toilet, Grab bars in shower, Wheelchair ADL Screening (condition at time of admission) Patient's cognitive ability adequate to safely complete daily activities?: Yes Is the patient deaf or have difficulty hearing?: No Does the patient have difficulty seeing, even when wearing glasses/contacts?: Yes Does the patient have difficulty concentrating, remembering, or making decisions?: No Patient able to express need for assistance with ADLs?: Yes Does the patient have difficulty dressing or bathing?: Yes Independently performs ADLs?: No Does the patient have difficulty walking or climbing stairs?: Yes Weakness of Legs: Left Weakness of Arms/Hands: None(L arm amputation)  Permission Sought/Granted   Permission granted to share information with : Yes, Verbal Permission Granted              Emotional Assessment Appearance:: Appears stated age Attitude/Demeanor/Rapport: Engaged Affect (typically observed): Appropriate Orientation: : Oriented to Self, Oriented to Place, Oriented to  Time, Oriented to Situation Alcohol / Substance Use: Not Applicable Psych Involvement: No (comment)  Admission diagnosis:  Recurrent dislocation of left hip. Patient Active Problem List   Diagnosis Date Noted  . Back pain 03/16/2019  . Constipation 03/16/2019  .  Parkinson disease ([place]) 03/16/2019  . Murmur 03/16/2019  . Neck pain 03/16/2019  . 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 Essential hypertension 02/22/2019  . At high risk for falls 01/25/2019  . Drug-induced tremor 01/25/2019  . Essential tremor 01/25/2019  .  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 Displacement of internal left hip prosthesis ([place]) 11/29/2018  . Hip dislocation, left ([place]) 11/29/2018  . Hip dislocation, left, sequela 08/06/2018  . High anion gap metabolic acidosis [id number]  . History of alcohol abuse 10/09/2016  . Neuropathy of left upper extremity 10/09/2016  . Urinary incontinence, mixed 10/09/2016  . Overdose of antidepressant 07/03/2016  . 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 Bipolar I disorder, most recent episode mixed, severe with psychotic features ([place]) 07/03/2015  . Alcohol use disorder, severe, dependence ([place]) 01/26/2015  . Tobacco use disorder 01/26/2015  . Nicotine dependence, uncomplicated [id number]  . Alcohol withdrawal ([place]) 01/08/2015  . 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 Suicidal ideation 12/19/2014  . Alcohol use 10/30/2014  . Anxiety 10/30/2014  . Depression 10/30/2014  . GAD (generalized anxiety disorder) 12/24/2013  . PTSD (post-traumatic stress disorder) 12/22/2013   PCP:  Dion Body, MD Pharmacy:   [facility] [place], [place] - [address] [place] [address] Phone: [phone] Fax: [phone]  [id number] - [address], [place] [address] Phone: [phone] Fax: [phone]     Social Determinants of Health (SDOH) Interventions    Readmission Risk Interventions No flowsheet data found.

## 2019-03-17 NOTE — Progress Notes (Signed)
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  PMH includes LUE amputation, PD, HTN, asthma, bipolar disorder, anxiety, EtOH abuse, depression, neuropathy, lumbar Sx, and R THA.    PT Comments    Pt presented with deficits in strength, transfers, mobility, gait, balance, and activity tolerance but made good progress towards goals this session.  Pt required minimal assistance during sit to sup for LLE support.  Pt required min A to stand from sitting with cues for sequencing to ensure compliance with hip precautions and with education provided on ensuring seating is elevated at home.  Pt was able to amb 30' with CGA and one loftstrand crutch with slow, cautious cadence but was steady without LOB or LLE buckling.  Pt was able to recall sequencing with good carryover during left turn training to prevent IR of the L hip.  Pt will benefit from HHPT services upon discharge to safely address above deficits for decreased caregiver assistance and eventual return to PLOF.    Follow Up Recommendations  Home health PT;Supervision for mobility/OOB     Equipment Recommendations  None recommended by PT    Recommendations for Other Services       Precautions / Restrictions Precautions Precautions: Posterior Hip Precaution Booklet Issued: Yes (comment) Restrictions Weight Bearing Restrictions: Yes LLE Weight Bearing: Weight bearing as tolerated    Mobility  Bed Mobility Overal bed mobility: Needs Assistance Bed Mobility: Sit to Supine     Supine to sit: Supervision;Modified independent (Device/Increase time);HOB elevated Sit to supine: Min assist   General bed mobility comments: Min A for LLE into bed  Transfers Overall  transfer level: Needs assistance Equipment used: Lofstrands Transfers: Sit to/from Stand Sit to Stand: Min assist         General transfer comment: Min A to stand from standard height recliner to ensure hip precaution compliance; pt education provided to ensure sitting position is elevated  Ambulation/Gait Ambulation/Gait assistance: Min guard Gait Distance (Feet): 30 Feet Assistive device: Lofstrands Gait Pattern/deviations: Step-to pattern;Decreased step length - right;Decreased stance time - left;Antalgic Gait velocity: decreased   General Gait Details: Pt steady with amb with one loftstrand crutch although with slow, cautious step-to gait pattern   Stairs             Wheelchair Mobility    Modified Rankin (Stroke Patients Only)       Balance Overall balance assessment: Needs assistance Sitting-balance support: Feet supported;No upper extremity supported Sitting balance-Leahy Scale: Good Sitting balance - Comments: Pt steady sitting, reaching within BOS. Demonstrates good static and dynamic sitting balance during use of BSC. Able to weight shift to complete peri-care independently   Standing balance support: Single extremity supported Standing balance-Leahy Scale: Fair Standing balance comment: No LOB in standing or during amb but moderate reliance on loftstrand crutch for support                            Cognition Arousal/Alertness: Awake/alert Behavior During Therapy: WFL for tasks assessed/performed Overall Cognitive Status: Within Functional Limits for tasks assessed  Exercises Total Joint Exercises Ankle Circles/Pumps: Strengthening;AROM;Both;10 reps;15 reps Quad Sets: Strengthening;Both;10 reps;15 reps Gluteal Sets: Strengthening;Both;10 reps;15 reps Towel Squeeze: Strengthening;Both;10 reps Hip ABduction/ADduction: AROM;AAROM;Both;5 reps Long Arc Quad: AROM;Strengthening;Both;10  reps Knee Flexion: AROM;Strengthening;Both;10 reps Other Exercises Other Exercises: Posterior hip precaution education/review verbally and during functional mobility Other Exercises: HEP education/review for BLE APs, QS, and GS x 10 each 5-6x/day Other Exercises: 90 deg left turn training x 4 to prevent CKC L hip IR Other Exercises: Pt assisted with use of BSC this date.    General Comments General comments (skin integrity, edema, etc.): Wound vac in place at start/end of session.      Pertinent Vitals/Pain Pain Assessment: 0-10 Pain Score: 8  Pain Location: L hip Pain Descriptors / Indicators: Aching;Sore Pain Intervention(s): Limited activity within patient's tolerance;Monitored during session;Repositioned;Utilized relaxation techniques;Premedicated before session    Home Living Family/patient expects to be discharged to:: Private residence Living Arrangements: Alone Available Help at Discharge: Friend(s);Available PRN/intermittently;Personal care attendant Type of Home: Apartment Home Access: Stairs to enter Entrance Stairs-Rails: Right;Left;Can reach both Home Layout: One level Home Equipment: Bedside commode;Other (comment);Shower seat;Wheelchair - manual(Loftstrand crutches)      Prior Function Level of Independence: Needs assistance  Gait / Transfers Assistance Needed: Pt Mod Ind with Amb with loftstrand crutch with 5 falls in the last year mostly secondary to L hip instability, Ind with bed mobility and transfers ADL's / Homemaking Assistance Needed: Pt has a PCA 2.5 hrs/day, 7 days/wk who assists as needed including with housework, bathing, and dressing     PT Goals (current goals can now be found in the care plan section) Acute Rehab PT Goals Patient Stated Goal: To get back to walking again Progress towards PT goals: Progressing toward goals    Frequency    BID      PT Plan Current plan remains appropriate    Co-evaluation              AM-PAC PT "6  Clicks" Mobility   Outcome Measure  Help needed turning from your back to your side while in a flat bed without using bedrails?: A Little Help needed moving from lying on your back to sitting on the side of a flat bed without using bedrails?: A Little Help needed moving to and from a bed to a chair (including a wheelchair)?: A Little Help needed standing up from a chair using your arms (e.g., wheelchair or bedside chair)?: A Little Help needed to walk in hospital room?: A Little Help needed climbing 3-5 steps with a railing? : A Little 6 Click Score: 18    End of Session Equipment Utilized During Treatment: Gait belt Activity Tolerance: Patient tolerated treatment well Patient left: in bed;with call bell/phone within reach;with bed alarm set;Other (comment);with SCD's reapplied;with nursing/sitter in room(Ice to L hip, hip abd pillows in place) Nurse Communication: Mobility status PT Visit Diagnosis: Unsteadiness on feet (R26.81);History of falling (Z91.81);Other abnormalities of gait and mobility (R26.89);Muscle weakness (generalized) (M62.81)     Time: [id number] PT Time Calculation (min) (ACUTE ONLY): 34 min  Charges:  $Gait Training: 8-22 mins $Therapeutic Exercise: 8-22 mins                     D. Scott Amorie Rentz PT, DPT 03/17/19, 12:03 PM

## 2019-03-17 NOTE — Progress Notes (Signed)
Per PT reports taking medications Gabapentin,. Propanalol and Carb/levodopa at home at 7am , 1200pm and 4-5pm..Verbal order Dr. Marry Guan may change times to when pt taking these medications at home

## 2019-03-17 NOTE — TOC Progression Note (Signed)
Transition of Care [facility]) - Progression Note    Patient Details  Name: [name] MRN: [id number] Date of Birth: 03-Nov-1955  [facility] CM/SW Contact  Su Hilt, RN Phone Number: 03/17/2019, 9:59 AM  Clinical Narrative:    Received a call from Blanchard Mane from [facility], Verified that they would accept the patient    Expected Discharge Plan: [place] Barriers to Discharge: Continued Medical Work up  Expected Discharge Plan and Services Expected Discharge Plan: [place]   Discharge Planning Services: CM Consult Post Acute Care Choice: [place] arrangements for the past 2 months: Single Family Home Expected Discharge Date: 03/18/19               DME Arranged: N/A         HH Arranged: PT, OT HH Agency: [facility] Date [place]: 03/17/19 Time [place]: [phone] Representative spoke with at [place]: [place] (SDOH) Interventions    Readmission Risk Interventions No flowsheet data found.

## 2019-03-17 NOTE — Progress Notes (Signed)
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  PMH includes LUE amputation, PD, HTN, asthma, bipolar disorder, anxiety, EtOH abuse, depression, neuropathy, lumbar Sx, and R THA.    PT Comments    Pt presented with deficits in strength, transfers, mobility, gait, balance, and activity tolerance.  Pt required min A with sit to sup for LLE support but only required SBA with cues for sequencing during sup to sit.  Pt required min A with transfers along with verbal cues for sequencing but was steady upon standing without LOB.  Pt presented with increased amb tolerance this session ambulating 100' with a loftstrand crutch and CGA with no LOB and with good carry over on proper sequencing for hip precaution compliance.  Pt will benefit from HHPT services upon discharge to safely address above deficits for decreased caregiver assistance and eventual return to PLOF.    Follow Up Recommendations  Home health PT;Supervision for mobility/OOB     Equipment Recommendations  None recommended by PT    Recommendations for Other Services       Precautions / Restrictions Precautions Precautions: Posterior Hip Precaution Booklet Issued: Yes (comment) Restrictions Weight Bearing Restrictions: Yes LLE Weight Bearing: Weight bearing as tolerated    Mobility  Bed Mobility Overal bed mobility: Needs Assistance Bed Mobility: Sit to Supine;Supine to Sit     Supine to sit: Supervision Sit to supine: Min assist   General bed mobility comments: Min A for LLE into bed during sit to sup but SBA only during sup to sit  Transfers Overall transfer level: Needs assistance Equipment used: Lofstrands Transfers: Sit to/from Stand Sit to  Stand: Min assist         General transfer comment: Min A to stand from the EOB with min-mod verbal cues for sequencing for hip precaution compliance.  Ambulation/Gait Ambulation/Gait assistance: Min guard Gait Distance (Feet): 100 Feet Assistive device: Lofstrands Gait Pattern/deviations: Step-to pattern;Decreased step length - right;Decreased stance time - left;Antalgic Gait velocity: decreased   General Gait Details: Pt steady with amb with one loftstrand crutch with continued slow cadence with step-to gait pattern but gait speed grossly increased compared to prior session   Stairs             Wheelchair Mobility    Modified Rankin (Stroke Patients Only)       Balance Overall balance assessment: Needs assistance Sitting-balance support: Feet supported;No upper extremity supported Sitting balance-Leahy Scale: Good     Standing balance support: Single extremity supported Standing balance-Leahy Scale: Fair Standing balance comment: No LOB in standing or during amb but moderate reliance on loftstrand crutch for support                            Cognition Arousal/Alertness: Awake/alert Behavior During Therapy: WFL for tasks assessed/performed Overall Cognitive Status: Within Functional Limits for tasks assessed                                        Exercises Total Joint Exercises Ankle Circles/Pumps: Strengthening;AROM;Both;10 reps;15 reps Quad Sets: Strengthening;Both;10 reps;15 reps Gluteal Sets: Strengthening;Both;10 reps;15 reps Towel Squeeze:  Strengthening;Both;10 reps Hip ABduction/ADduction: AROM;AAROM;Both;5 reps;10 reps Long Arc Quad: AROM;Strengthening;Both;10 reps;15 reps Knee Flexion: AROM;Strengthening;Both;10 reps;15 reps Marching in Standing: AROM;Standing;10 reps;Both Other Exercises Other Exercises: Posterior hip precaution education/review verbally and during functional mobility Other Exercises: 90 deg left  turn training x 4 to prevent CKC L hip IR    General Comments        Pertinent Vitals/Pain Pain Assessment: 0-10 Pain Score: 3  Pain Location: L hip Pain Descriptors / Indicators: Aching;Sore Pain Intervention(s): Premedicated before session;Monitored during session    Home Living                      Prior Function            PT Goals (current goals can now be found in the care plan section) Progress towards PT goals: Progressing toward goals    Frequency    BID      PT Plan Current plan remains appropriate    Co-evaluation              AM-PAC PT "6 Clicks" Mobility   Outcome Measure  Help needed turning from your back to your side while in a flat bed without using bedrails?: A Little Help needed moving from lying on your back to sitting on the side of a flat bed without using bedrails?: A Little Help needed moving to and from a bed to a chair (including a wheelchair)?: A Little Help needed standing up from a chair using your arms (e.g., wheelchair or bedside chair)?: A Little Help needed to walk in hospital room?: A Little Help needed climbing 3-5 steps with a railing? : A Little 6 Click Score: 18    End of Session Equipment Utilized During Treatment: Gait belt Activity Tolerance: Patient tolerated treatment well Patient left: in bed;with call bell/phone within reach;with bed alarm set;Other (comment);with SCD's reapplied(Abd pillows in place) Nurse Communication: Mobility status PT Visit Diagnosis: Unsteadiness on feet (R26.81);History of falling (Z91.81);Other abnormalities of gait and mobility (R26.89);Muscle weakness (generalized) (M62.81)     Time: 1443-1510 PT Time Calculation (min) (ACUTE ONLY): 27 min  Charges:  $Gait Training: 8-22 mins $Therapeutic Exercise: 8-22 mins                     D. Scott Barry [place] PT, DPT 03/17/19, 6:02 PM

## 2019-03-17 NOTE — Progress Notes (Signed)
Pt very demanding this shift, changed 3 of her meds per Dr. Maree Krabbe orders , pt did not mention Amantadine as myself and Staff RN "Ana" in room . States we did not listen to her and became beligerent with staff. Redirection semi-effective , however pt is very verbally abusive toward staff. Sent message to Dr. Marry Guan awaiting response.

## 2019-03-17 NOTE — TOC Benefit Eligibility Note (Signed)
Transition of Care [facility]) Benefit Eligibility Note    Patient Details  Name: [name] MRN: [id number] Date of Birth: 06/05/1956   Medication/Dose: Enoxparin '40mg'$  once daily for 14 days  Covered?: Yes  Prescription Coverage Preferred Pharmacy: [facility]  Spoke with Person/Company/Phone Number:: Avis with [employer] at [phone]  Co-Pay: $1.30 estimated copay  Prior Approval: No  Deductible: [place] Phone Number: [phone] or [phone] 03/17/2019, 11:04 AM

## 2019-03-17 NOTE — Evaluation (Signed)
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  PMH includes LUE amputation, PD, HTN, asthma, bipolar disorder, anxiety, EtOH abuse, depression, neuropathy, lumbar Sx, and R THA.   Clinical Impression   [name] was seen for OT evaluation this date, POD#1 from above surgery. Pt reports that over the past several months she has required assistance from a PCA for bathing and dressing and IADL mgt. At baseline, she ambulates using a loftstrand crutch 2/2 L hip and leg pain.  Pt is eager to return to PLOF with less pain and improved safety and independence. Pt currently requires moderate assist for LB dressing while in seated position due to pain and limited AROM of L hip. Pt able to recall 3/3 posterior total hip precautions at start of session but unable to verbalize how to implement during ADL and mobility. Pt instructed in posterior total hip precautions and how to implement, self care skills, falls prevention strategies, home/routines modifications, DME/AE for LB bathing and dressing tasks, compression stocking mgt strategies, and car transfer techniques. At end of session, pt able to recall 3/3 posterior total hip precautions. Pt would benefit from additional instruction in self care skills and techniques to help maintain precautions with or without assistive devices to support recall and carryover prior to discharge. Recommend HHOT upon hospital discharge.       Follow Up Recommendations  Home health OT;Supervision - Intermittent    Equipment Recommendations  Other (comment)(Pt already has BSC/Shower seat. Would benefit from [place] sponge, leg lifter, LH shoe horn, and sock-aid.)    Recommendations for Other Services        Precautions / Restrictions Precautions Precautions: Posterior Hip Restrictions Weight Bearing Restrictions: Yes LLE Weight Bearing: Weight bearing as tolerated      Mobility Bed Mobility Overal bed mobility: Needs Assistance Bed Mobility: Supine to Sit     Supine to sit: Supervision;Modified independent (Device/Increase time);HOB elevated     General bed mobility comments: Pt able to move LLE OOB this date without physical assist from this therapist. [name] elevated/increased time/effort to complete  Transfers Overall transfer level: Needs assistance Equipment used: Lofstrands Transfers: Sit to/from Stand Sit to Stand: Min guard;From elevated surface         General transfer comment: Min guard for safety and mgt of lines/leads this date.    Balance Overall balance assessment: Needs assistance Sitting-balance support: Feet supported;No upper extremity supported Sitting balance-Leahy Scale: Good Sitting balance - Comments: Pt steady sitting, reaching within BOS. Demonstrates good static and dynamic sitting balance during use of BSC. Able to weight shift to complete peri-care independently   Standing balance support: Single extremity supported Standing balance-Leahy Scale: Fair Standing balance comment: Supervision to CGA for stability during functional mobility this date. Pt able to ambulate ~10 feet around bed to room recliner with multliple direction changes. VC's for adherence to hip precuations throughout.                           ADL either performed or assessed with clinical judgement   ADL Overall ADL's : Needs assistance/impaired Eating/Feeding: Sitting;Independent Eating/Feeding Details (indicate cue type and reason): Pt manages 1-handed eating very well. Does not require assist. Grooming: Set up;Sitting  Upper Body Bathing: Sitting;Minimal assistance;Min guard   Lower Body Bathing: Set up;Minimal assistance;Moderate assistance;Sitting/lateral  leans;Cueing for safety;Adhering to hip precautions   Upper Body Dressing : Sitting;Set up;Min guard   Lower Body Dressing: Minimal assistance;Moderate assistance;Sit to/from stand;Cueing for safety;Adhering to hip precautions   Toilet Transfer: Set up;BSC;Ambulation;Min guard Toilet Transfer Details (indicate cue type and reason): Pt able to ambulate to [facility] this date with CGA for safety and mgt of lines/leads as well as using her loftstrand crutch. Toileting- Clothing Manipulation and Hygiene: Set up;Min guard;Sit to/from stand;Adhering to hip precautions       Functional mobility during ADLs: Min guard(with loftstand crutch) General ADL Comments: Pt able to ambulate in room this am using her loftstand crutch and given CGA to SBA for mgt of lines and leads. Pt c/o of significant increase in pain during functional mobility and transfers. Limited by pain and decreased ROM of the LUE. Verbalizes 3/3 posterior hip precautions at beginning and end of session.     Vision Baseline Vision/History: Wears glasses(Pt states she should wear glasses but does not have any 2/2 no vision insurance.) Wears Glasses: At all times Patient Visual Report: No change from baseline       Perception     Praxis      Pertinent Vitals/Pain Pain Score: 4  Pain Location: L hip-increases with mobility Pain Descriptors / Indicators: Aching;Sore Pain Intervention(s): Limited activity within patient's tolerance;Monitored during session;Repositioned;Utilized relaxation techniques;Premedicated before session     Hand Dominance Right   Extremity/Trunk Assessment Upper Extremity Assessment Upper Extremity Assessment: Overall WFL for tasks assessed;LUE deficits/detail LUE Deficits / Details: hx of LUE amputation.   Lower Extremity Assessment Lower Extremity Assessment: Defer to PT evaluation;Overall WFL for tasks assessed LLE Deficits / Details: s/p L total hip revision.       Communication  Communication Communication: No difficulties   Cognition Arousal/Alertness: Awake/alert Behavior During Therapy: WFL for tasks assessed/performed Overall Cognitive Status: Within Functional Limits for tasks assessed                                     General Comments  Wound vac in place at start/end of session.    Exercises Other Exercises Other Exercises: Pt instructed in posterior hip precautions as well as implications for ADL mgt. Pt able to recall 3/3 hip precautions independently at start/end of session. Would benefit from trial of AE to support ADL mgt. Other Exercises: Pt educated in falls prevention strategies, safe use of AE/DME for adl mgt, & compression stocking mgt. Other Exercises: Pt assisted with use of BSC this date.   Shoulder Instructions      Home Living Family/patient expects to be discharged to:: Private residence Living Arrangements: Alone Available Help at Discharge: Friend(s);Available PRN/intermittently;Personal care attendant Type of Home: Apartment Home Access: Stairs to enter Entrance Stairs-Number of Steps: 3 Entrance Stairs-Rails: Right;Left;Can reach both Home Layout: One level     Bathroom Shower/Tub: Teacher, early years/pre: Handicapped height Bathroom Accessibility: Yes   Home Equipment: Bedside commode;Other (comment);Shower seat;Wheelchair - manual(Loftstrand crutches)          Prior Functioning/Environment Level of Independence: Needs assistance  Gait / Transfers Assistance Needed: Pt Mod Ind with Amb with loftstrand crutch with 5 falls in the last year mostly secondary to L hip instability, Ind with bed mobility and transfers ADL's / Homemaking Assistance Needed: Pt has a PCA 2.5 hrs/day,  7 days/wk who assists as needed including with housework, bathing, and dressing            OT Problem List: Decreased strength;Decreased coordination;Pain;Decreased range of motion;Decreased activity  tolerance;Decreased safety awareness;Impaired balance (sitting and/or standing);Decreased knowledge of precautions;Impaired UE functional use;Decreased knowledge of use of DME or AE      OT Treatment/Interventions: Self-care/ADL training;Balance training;Therapeutic exercise;Therapeutic activities;Energy conservation;DME and/or AE instruction;Patient/family education    OT Goals(Current goals can be found in the care plan section) Acute Rehab OT Goals Patient Stated Goal: To get back to walking again OT Goal Formulation: With patient Time For Goal Achievement: 03/31/19 Potential to Achieve Goals: Good ADL Goals Pt Will Perform Lower Body Bathing: with supervision;with set-up;sitting/lateral leans(With LRAD PRN for improved safety and functional independence.) Pt Will Perform Lower Body Dressing: with min guard assist;with min assist;with adaptive equipment;sit to/from stand(With LRAD PRN for improved safety and functional independence.) Pt Will Transfer to Toilet: with modified independence;ambulating;bedside commode(With LRAD PRN for improved safety and functional independence.) Pt Will Perform Toileting - Clothing Manipulation and hygiene: sit to/from stand;with modified independence(With LRAD PRN for improved safety and functional independence.)  OT Frequency: Min 2X/week   Barriers to D/C: Inaccessible home environment;Decreased caregiver support          Co-evaluation              AM-PAC OT "6 Clicks" Daily Activity     Outcome Measure Help from another person eating meals?: A Little Help from another person taking care of personal grooming?: A Little Help from another person toileting, which includes using toliet, bedpan, or urinal?: A Little Help from another person bathing (including washing, rinsing, drying)?: A Lot Help from another person to put on and taking off regular upper body clothing?: A Little Help from another person to put on and taking off regular lower body  clothing?: A Lot 6 Click Score: 16   End of Session Equipment Utilized During Treatment: Gait belt;Other (comment)(loftstrand crutch) Nurse Communication: Mobility status;Other (comment)(Pt used BSC)  Activity Tolerance: Patient tolerated treatment well Patient left: in chair;with call bell/phone within reach;with chair alarm set;with SCD's reapplied  OT Visit Diagnosis: Other abnormalities of gait and mobility (R26.89);Repeated falls (R29.6);Pain Pain - Right/Left: Left Pain - part of body: Hip;Knee;Leg                Time: [id number] OT Time Calculation (min): 40 min Charges:  OT General Charges $OT Visit: 1 Visit OT Evaluation $OT Eval Moderate Complexity: 1 Mod OT Treatments $Self Care/Home Management : 23-37 mins  Shara Blazing, M.S., OTR/L Ascom: [phone] 03/17/19, 9:56 AM

## 2019-03-18 NOTE — TOC Transition Note (Addendum)
Transition of Care [facility]) - CM/SW Discharge Note   Patient Details  Name: [name] MRN: [id number] Date of Birth: [birth year]  [facility] CM/SW Contact:  Marvis Saefong, Lenice Llamas Phone Number: [phone]  03/18/2019, 11:52 AM   Clinical Narrative: Clinical Social Worker (CSW) notified [name] [facility] home health representative that patient will D/C home today with home health PT and OT. Patient reported that she has a rolling walker and bedside commode and has no DME needs. Patient notified of her Lovenox price $1.30. RN aware of above. Please reconsult if future social work needs arise. CSW signing off.      Final next level of care: [place] Barriers to Discharge: Barriers Resolved   Patient Goals and CMS Choice Patient states their goals for this hospitalization and ongoing recovery are:: To go home. CMS Medicare.gov Compare Post Acute Care list provided to:: Patient Choice offered to / list presented to : Patient  Discharge Placement                       Discharge Plan and Services   Discharge Planning Services: CM Consult Post Acute Care Choice: Home Health          DME Arranged: N/A         HH Arranged: PT, OT [place] Agency: [facility] Date [place]: 03/18/19 Time [place]: [phone] Representative spoke with at [place]: [place] ([place]) Interventions     Readmission Risk Interventions No flowsheet data found.

## 2019-03-18 NOTE — Progress Notes (Signed)
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  PMH includes LUE amputation, PD, HTN, asthma, bipolar disorder, anxiety, EtOH abuse, depression, neuropathy, lumbar Sx, and R THA.    PT Comments    Pt presented with deficits in strength, transfers, gait, balance, and activity tolerance but made good progress towards goals this session.  Pt was Mod Ind with bed mobility tasks and SBA with transfers with good eccentric and concentric control.  Pt was able to amb 150' with SBA with improved cadence with good stability.  Pt was able to ascend and descend six steps with one rail with min verbal cues for sequencing with good eccentric and concentric control and stability.  Pt will benefit from HHPT services upon discharge to safely address above deficits for decreased caregiver assistance and eventual return to PLOF.     Follow Up Recommendations  Home health PT;Supervision for mobility/OOB     Equipment Recommendations  None recommended by PT    Recommendations for Other Services       Precautions / Restrictions Precautions Precautions: Posterior Hip Precaution Booklet Issued: Yes (comment) Restrictions Weight Bearing Restrictions: Yes LLE Weight Bearing: Weight bearing as tolerated    Mobility  Bed Mobility Overal bed mobility: Modified Independent Bed Mobility: Supine to Sit;Sit to Supine     Supine to sit: Modified independent (Device/Increase time) Sit to supine: Modified independent (Device/Increase time)   General bed mobility comments: Extra time and effort but no physical assistance or cuing required this session  Transfers Overall transfer level: Needs assistance Equipment used: Lofstrands Transfers:  Sit to/from Stand Sit to Stand: Supervision;From elevated surface         General transfer comment: Good sequencing and stability with transfers  Ambulation/Gait Ambulation/Gait assistance: Supervision Gait Distance (Feet): 150 Feet Assistive device: Lofstrands Gait Pattern/deviations: Step-to pattern;Decreased step length - right;Decreased stance time - left Gait velocity: decreased   General Gait Details: Improved cadence with amb this session with no LOB or buckling.  Good carryover regarding sequencing during L turns to prevent L hip IR.   Stairs Stairs: Yes Stairs assistance: Min guard Stair Management: One rail Right Number of Stairs: 6 General stair comments: Good eccentric and concentric control during stair training with min verbal cues for proper sequencing.   Wheelchair Mobility    Modified Rankin (Stroke Patients Only)       Balance Overall balance assessment: Needs assistance Sitting-balance support: Feet supported;No upper extremity supported Sitting balance-Leahy Scale: Good     Standing balance support: Single extremity supported Standing balance-Leahy Scale: Good                              Cognition Arousal/Alertness: Awake/alert Behavior During Therapy: WFL for tasks assessed/performed Overall Cognitive Status: Within Functional Limits for tasks assessed                                        Exercises Total Joint Exercises Ankle Circles/Pumps: Strengthening;AROM;Both;10 reps Quad Sets: Strengthening;Both;10 reps Gluteal Sets: Strengthening;Both;10 reps Long Arc Quad: AROM;Strengthening;Both;10 reps Knee Flexion: AROM;Strengthening;Both;10 reps Marching in Standing: AROM;Standing;10 reps;Both Other Exercises  Other Exercises: Posterior hip precaution education/review verbally and during functional mobility Other Exercises: Sit to/from stand transfer training from EOB and toilet with min verbal cues for proper  sequencing Other Exercises: 90 deg left turn training x 4 to prevent CKC L hip IR    General Comments        Pertinent Vitals/Pain Pain Assessment: 0-10 Pain Score: 3  Pain Location: L hip Pain Descriptors / Indicators: Aching;Sore Pain Intervention(s): Premedicated before session;Monitored during session    Home Living                      Prior Function            PT Goals (current goals can now be found in the care plan section) Progress towards PT goals: Progressing toward goals    Frequency    BID      PT Plan Current plan remains appropriate    Co-evaluation              AM-PAC PT "6 Clicks" Mobility   Outcome Measure  Help needed turning from your back to your side while in a flat bed without using bedrails?: None Help needed moving from lying on your back to sitting on the side of a flat bed without using bedrails?: None Help needed moving to and from a bed to a chair (including a wheelchair)?: A Little Help needed standing up from a chair using your arms (e.g., wheelchair or bedside chair)?: A Little Help needed to walk in hospital room?: A Little Help needed climbing 3-5 steps with a railing? : A Little 6 Click Score: 20    End of Session Equipment Utilized During Treatment: Gait belt Activity Tolerance: Patient tolerated treatment well Patient left: in bed;with call bell/phone within reach;with bed alarm set;Other (comment);with SCD's reapplied;with family/visitor present(abd pillows in place) Nurse Communication: Mobility status PT Visit Diagnosis: Unsteadiness on feet (R26.81);History of falling (Z91.81);Other abnormalities of gait and mobility (R26.89);Muscle weakness (generalized) (M62.81)     Time: 1056(And 9:25 to 9:31)-1113 PT Time Calculation (min) (ACUTE ONLY): 17 min  Charges:  $Gait Training: 8-22 mins $Therapeutic Exercise: 8-22 mins                     D. Scott Edge Mauger PT, DPT 03/18/19, 1:20 PM

## 2019-03-18 NOTE — Progress Notes (Signed)
  Subjective: 2 Days Post-Op Procedure(s) (LRB): TOTAL HIP REVISION ARTHROPLASTY WITH CONVERSION TO A CONSTRAINED LINER. (Left) Patient reports pain as mild to moderate.  Her lumbar spine is feeling better. Patient seen in rounds with Dr. Marry Guan. Patient is well, and has had no acute complaints or problems Plan is to go Home after hospital stay. Negative for chest pain and shortness of breath Fever: no Gastrointestinal: Negative for nausea and vomiting  Objective: Vital signs in last 24 hours: Temp:  [97.6 F (36.4 C)-98.4 F (36.9 C)] 98.2 F (36.8 C) (09/24 2340) Pulse Rate:  [67-78] 76 (09/24 2340) Resp:  [14-18] 15 (09/24 2340) BP: (102-125)/(45-76) 113/58 (09/24 2340) SpO2:  [93 %-99 %] 95 % (09/24 2340)  Intake/Output from previous day:  Intake/Output Summary (Last 24 hours) at 03/18/2019 [id number] Last data filed at 03/18/2019 0500 Gross per 24 hour  Intake 480 ml  Output 50 ml  Net 430 ml    Intake/Output this shift: Total I/O In: -  Out: 10 [Drains:10]  Labs: No results for input(s): HGB in the last 72 hours. No results for input(s): WBC, RBC, HCT, PLT in the last 72 hours. No results for input(s): NA, K, CL, CO2, BUN, CREATININE, GLUCOSE, CALCIUM in the last 72 hours. No results for input(s): LABPT, INR in the last 72 hours.   EXAM General - Patient is Alert and Oriented Extremity - Neurovascular intact Sensation intact distally Dorsiflexion/Plantar flexion intact Compartment soft Dressing/Incision - clean, dry, with the Hemovac removed earlier with no complication. Motor Function - intact, moving foot and toes well on exam.  She ambulated 100 feet with physical therapy  Past Medical History:  Diagnosis Date  . Anxiety   . Anxiety    panic anxiety disorder  . Arthritis   . Asthma    chronic asthmatic bronchitis  . Bipolar disorder ([place])   . 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 Depression   . ETOH abuse   . Heart murmur   . Insomnia   .  Neuropathy     Assessment/Plan: 2 Days Post-Op Procedure(s) (LRB): TOTAL HIP REVISION ARTHROPLASTY WITH CONVERSION TO A CONSTRAINED LINER. (Left) Active Problems:   S/P revision of total hip  Estimated body mass index is 23.48 kg/m as calculated from the following:   Height as of this encounter: 5\' 5"  (1.651 m).   Weight as of this encounter: 64 kg. Advance diet Up with therapy D/C IV fluids Discharge home with home health planned for today Bowel movement before discharge  DVT Prophylaxis - Lovenox, Foot Pumps and TED hose Weight-Bearing as tolerated to left leg  Reche Dixon, PA-C Orthopaedic Surgery 03/18/2019, 6:28 AM

## 2019-03-18 NOTE — Discharge Summary (Signed)
Physician Discharge Summary  Subjective: 2 Days Post-Op Procedure(s) (LRB): TOTAL HIP REVISION ARTHROPLASTY WITH CONVERSION TO A CONSTRAINED LINER. (Left) Patient reports pain as mild.   Patient seen in rounds with Dr. Marry Guan. Patient is well, and has had no acute complaints or problems Patient is ready to go home with home health physical therapy  Physician Discharge Summary  Patient ID: [name] MRN: [id number] DOB/AGE: 03/19/1956 63 y.o.  Admit date: 03/16/2019 Discharge date: 03/18/2019  Admission Diagnoses:  Discharge Diagnoses:  Active Problems:   S/P revision of total hip   Discharged Condition: fair  Hospital Course: The patient is postop day 2 from a left revision total hip replacement.  She has done very well since surgery.  Her Hemovac was removed this morning.  The patient is in physical therapy and ambulated 100 feet.  She is still working on a bowel movement.  Her vitals have remained stable.  The patient is ready go home with home health physical therapy today.  Treatments: surgery:   Left total hip revision with conversion to a constrained liner  SURGEON:  Marciano Sequin., M.D.          ASSISTANT: Vance Peper, PA  ANESTHESIA: general  ESTIMATED BLOOD LOSS: 50 mL  FLUIDS REPLACED: 1000 mL of crystalloid  DRAINS: 2 medium Hemovac drains  IMPLANTS UTILIZED: Depuy +4 mm neutral constrained polyethylene liner with locking ring, 28 mm cobalt chrome hip ball with a +5 mm neck length.  Discharge Exam: Blood pressure 137/74, pulse 76, temperature 98.2 F (36.8 C), temperature source Oral, resp. rate 15, height 5\' 5"  (1.651 m), weight 64 kg, SpO2 95 %.   Disposition: Discharge disposition: 01-Home or Self Care        Allergies as of 03/18/2019      Reactions   Benadryl [diphenhydramine] Hives, Other (See Comments)   Reaction:  Hyperactivity    Benadryl [diphenhydramine] Other (See Comments)   My jaw locked and I could not speak, like a  stroke.   Cephalosporins Hives   Codeine Nausea And Vomiting      Medication List    STOP taking these medications   aspirin EC 81 MG tablet     TAKE these medications   acetaminophen 500 MG tablet Commonly known as: TYLENOL Take 1,000 mg by mouth every 6 (six) hours as needed.   Amantadine HCl 100 MG tablet Take 200 mg by mouth 2 (two) times daily.   calcium carbonate 600 MG Tabs tablet Commonly known as: OS-CAL Take 600 mg by mouth daily with breakfast.   carbidopa-levodopa 25-100 MG tablet Commonly known as: SINEMET IR Take 2 tablets by mouth 3 (three) times daily.   celecoxib 200 MG capsule Commonly known as: CELEBREX Take 1 capsule (200 mg total) by mouth 2 (two) times daily.   clonazePAM 0.5 MG tablet Commonly known as: KLONOPIN Take 1 tablet (0.5 mg total) by mouth 2 (two) times daily as needed for anxiety.   cyclobenzaprine 5 MG tablet Commonly known as: FLEXERIL Take 5 mg by mouth 3 (three) times daily as needed for muscle spasms.   enoxaparin 40 MG/0.4ML injection Commonly known as: LOVENOX Inject 0.4 mLs (40 mg total) into the skin daily for 14 doses.   gabapentin 300 MG capsule Commonly known as: NEURONTIN Take 3 capsules (900 mg total) by mouth 3 (three) times daily.   hydrochlorothiazide 25 MG tablet Commonly known as: HYDRODIURIL Take 25 mg by mouth daily.   oxyCODONE 5 MG immediate release  tablet Commonly known as: Oxy IR/ROXICODONE Take 1 tablet (5 mg total) by mouth every 4 (four) hours as needed for moderate pain (pain score 4-6).   propranolol 10 MG tablet Commonly known as: INDERAL Take 1 tablet (10 mg total) by mouth 3 (three) times daily. What changed: how much to take   traMADol 50 MG tablet Commonly known as: ULTRAM Take 1-2 tablets (50-100 mg total) by mouth every 4 (four) hours as needed for moderate pain.   traZODone 150 MG tablet Commonly known as: DESYREL Take 300 mg by mouth at bedtime.   venlafaxine XR 150 MG 24 hr  capsule Commonly known as: EFFEXOR-XR Take 2 capsules (300 mg total) by mouth daily with breakfast.            Durable Medical Equipment  (From admission, onward)         Start     Ordered   03/16/19 1147  DME Walker rolling  Once    Question:  Patient needs a walker to treat with the following condition  Answer:  S/P total hip arthroplasty   03/16/19 1146   03/16/19 1147  DME Bedside commode  Once    Question:  Patient needs a bedside commode to treat with the following condition  Answer:  S/P total hip arthroplasty   03/16/19 1146         Follow-up Information    Watt Climes, PA On 03/30/2019.   Specialty: Physician Assistant Why: at 1:15pm Contact information: [place] [address] [phone]        Dereck Leep, MD On 05/03/2019.   Specialty: Orthopedic Surgery Why: at 10:00am Contact information: [place] [address] [phone]           Signed: Prescott Parma, Kaylon Hitz 03/18/2019, 6:30 AM   Objective: Vital signs in last 24 hours: Temp:  [97.6 F (36.4 C)-98.4 F (36.9 C)] 98.2 F (36.8 C) (09/24 2340) Pulse Rate:  [67-78] 76 (09/24 2340) Resp:  [14-18] 15 (09/24 2340) BP: (102-137)/(45-76) 137/74 (09/25 0629) SpO2:  [93 %-99 %] 95 % (09/24 2340)  Intake/Output from previous day:  Intake/Output Summary (Last 24 hours) at 03/18/2019 0630 Last data filed at 03/18/2019 0500 Gross per 24 hour  Intake 480 ml  Output 50 ml  Net 430 ml    Intake/Output this shift: Total I/O In: -  Out: 10 [Drains:10]  Labs: No results for input(s): HGB in the last 72 hours. No results for input(s): WBC, RBC, HCT, PLT in the last 72 hours. No results for input(s): NA, K, CL, CO2, BUN, CREATININE, GLUCOSE, CALCIUM in the last 72 hours. No results for input(s): LABPT, INR in the last 72 hours.  EXAM: General - Patient is Alert and Oriented Extremity - Neurovascular intact Sensation intact  distally Dorsiflexion/Plantar flexion intact Compartment soft Incision - clean, dry, no drainage Motor Function -plantarflexion and dorsiflexion intact.  Assessment/Plan: 2 Days Post-Op Procedure(s) (LRB): TOTAL HIP REVISION ARTHROPLASTY WITH CONVERSION TO A CONSTRAINED LINER. (Left) Procedure(s) (LRB): TOTAL HIP REVISION ARTHROPLASTY WITH CONVERSION TO A CONSTRAINED LINER. (Left) Past Medical History:  Diagnosis Date  . Anxiety   . Anxiety    panic anxiety disorder  . Arthritis   . Asthma    chronic asthmatic bronchitis  . Bipolar disorder ([place])   . Cancer [facility])    Desmoid tumor left forearm  . Depression   . ETOH abuse   . Heart murmur   . Insomnia   .  Neuropathy    Active Problems:   S/P revision of total hip  Estimated body mass index is 23.48 kg/m as calculated from the following:   Height as of this encounter: 5\' 5"  (1.651 m).   Weight as of this encounter: 64 kg. Advance diet Up with therapy Discharge home with home health Diet - Regular diet Follow up - in 2 weeks Activity - WBAT Disposition - Home Condition Upon Discharge - Stable DVT Prophylaxis - Lovenox and TED hose  Reche Dixon, PA-C Orthopaedic Surgery 03/18/2019, 6:30 AM

## 2019-03-18 NOTE — Anesthesia Postprocedure Evaluation (Signed)
Anesthesia Post Note  Patient: [name]  Procedure(s) Performed: TOTAL HIP REVISION ARTHROPLASTY WITH CONVERSION TO A CONSTRAINED LINER. (Left Hip)  Patient location during evaluation: PACU Anesthesia Type: General Level of consciousness: awake and alert Pain management: pain level controlled Vital Signs Assessment: post-procedure vital signs reviewed and stable Respiratory status: spontaneous breathing, nonlabored ventilation and respiratory function stable Cardiovascular status: blood pressure returned to baseline and stable Postop Assessment: no apparent nausea or vomiting Anesthetic complications: no     Last Vitals:  Vitals:   03/18/19 0629 03/18/19 0748  BP: 137/74 136/76  Pulse:  78  Resp:  16  Temp:  36.7 C  SpO2:  97%    Last Pain:  Vitals:   03/18/19 0748  TempSrc: Oral  PainSc:                  Durenda Hurt

## 2019-03-18 NOTE — Care Management Important Message (Signed)
Important Message  Patient Details  Name: [name] MRN: [id number] Date of Birth: 1956-01-11   Medicare Important Message Given:  Yes     Dannette Barbara 03/18/2019, 10:20 AM

## 2019-03-18 NOTE — Progress Notes (Signed)
Pt stated she has had 2 BMs

## 2019-03-18 NOTE — Progress Notes (Signed)
Discharge instructions given. Prescriptions given. Pt verbalizes understanding. Pt escorted to vehicle in wheelchair by nursing staff.

## 2019-03-25 ENCOUNTER — Other Ambulatory Visit: Payer: Self-pay

## 2019-03-25 DIAGNOSIS — F17210 Nicotine dependence, cigarettes, uncomplicated: Secondary | ICD-10-CM | POA: Insufficient documentation

## 2019-03-25 DIAGNOSIS — J45909 Unspecified asthma, uncomplicated: Secondary | ICD-10-CM | POA: Insufficient documentation

## 2019-03-25 DIAGNOSIS — Z7901 Long term (current) use of anticoagulants: Secondary | ICD-10-CM | POA: Diagnosis not present

## 2019-03-25 DIAGNOSIS — Z79899 Other long term (current) drug therapy: Secondary | ICD-10-CM | POA: Insufficient documentation

## 2019-03-25 DIAGNOSIS — G20 Parkinson's disease: Secondary | ICD-10-CM | POA: Diagnosis not present

## 2019-03-25 DIAGNOSIS — I10 Essential (primary) hypertension: Secondary | ICD-10-CM | POA: Insufficient documentation

## 2019-03-25 NOTE — ED Triage Notes (Signed)
Patient reports that her parkinson's symptoms have reappeared and that she can't grab hold of any thing or stand to walk.

## 2019-03-25 NOTE — ED Notes (Signed)
Patient coming [facility] from home for Parkinson's flare. Patient has appointment with MD on Tuesday, but her symptoms is too severe to wait. Patient unable to ambulate for EMS. Patient also c/o LRQ pain and headache. ETOH on board (3 beers). Patient is a left arm amputee. Patient had left hip surgery on 9/23. Patient reportedly stopped taking her pain medication b/c she's no longer in pain. Patient was requesting pain medication from EMS in transport. Patient informed EMS that she usually "drinks away the pain".  Patient hx of bipolar and PTSD.  EMS vitals: BP 104/52, HR 69, 94% on RA.

## 2019-03-26 ENCOUNTER — Emergency Department
Admission: EM | Admit: 2019-03-26 | Discharge: 2019-03-26 | Disposition: A | Discharge status: Home / Self Care | Payer: Medicare Other | Attending: Emergency Medicine | Admitting: Emergency Medicine

## 2019-03-26 DIAGNOSIS — G20 Parkinson's disease: Secondary | ICD-10-CM | POA: Diagnosis not present

## 2019-03-26 LAB — URINALYSIS, ROUTINE W REFLEX MICROSCOPIC
Bilirubin Urine: NEGATIVE
Glucose, UA: NEGATIVE mg/dL
Hgb urine dipstick: NEGATIVE
Ketones, ur: NEGATIVE mg/dL
Leukocytes,Ua: NEGATIVE
Nitrite: NEGATIVE
Protein, ur: NEGATIVE mg/dL
Specific Gravity, Urine: 1.004 — ABNORMAL LOW (ref 1.005–1.030)
pH: 5 (ref 5.0–8.0)

## 2019-03-26 LAB — CBC WITH DIFFERENTIAL/PLATELET
Abs Immature Granulocytes: 0.38 10*3/uL — ABNORMAL HIGH (ref 0.00–0.07)
Basophils Absolute: 0.1 10*3/uL (ref 0.0–0.1)
Basophils Relative: 1 %
Eosinophils Absolute: 0.2 10*3/uL (ref 0.0–0.5)
Eosinophils Relative: 2 %
HCT: 35.1 % — ABNORMAL LOW (ref 36.0–46.0)
Hemoglobin: 11.7 g/dL — ABNORMAL LOW (ref 12.0–15.0)
Immature Granulocytes: 4 %
Lymphocytes Relative: 21 %
Lymphs Abs: 2.2 10*3/uL (ref 0.7–4.0)
MCH: 32 pg (ref 26.0–34.0)
MCHC: 33.3 g/dL (ref 30.0–36.0)
MCV: 95.9 fL (ref 80.0–100.0)
Monocytes Absolute: 0.9 10*3/uL (ref 0.1–1.0)
Monocytes Relative: 8 %
Neutro Abs: 6.5 10*3/uL (ref 1.7–7.7)
Neutrophils Relative %: 64 %
Platelets: 307 10*3/uL (ref 150–400)
RBC: 3.66 MIL/uL — ABNORMAL LOW (ref 3.87–5.11)
RDW: 14.1 % (ref 11.5–15.5)
WBC: 10.2 10*3/uL (ref 4.0–10.5)
nRBC: 0 % (ref 0.0–0.2)

## 2019-03-26 LAB — COMPREHENSIVE METABOLIC PANEL
ALT: 7 U/L (ref 0–44)
AST: 13 U/L — ABNORMAL LOW (ref 15–41)
Albumin: 3.9 g/dL (ref 3.5–5.0)
Alkaline Phosphatase: 91 U/L (ref 38–126)
Anion gap: 11 (ref 5–15)
BUN: 13 mg/dL (ref 8–23)
CO2: 24 mmol/L (ref 22–32)
Calcium: 9.1 mg/dL (ref 8.9–10.3)
Chloride: 98 mmol/L (ref 98–111)
Creatinine, Ser: 0.96 mg/dL (ref 0.44–1.00)
GFR calc Af Amer: >60 mL/min (ref >60)
GFR calc non Af Amer: >60 mL/min (ref >60)
Glucose, Bld: 86 mg/dL (ref 70–99)
Potassium: 4 mmol/L (ref 3.5–5.1)
Sodium: 133 mmol/L — ABNORMAL LOW (ref 135–145)
Total Bilirubin: 0.8 mg/dL (ref 0.3–1.2)
Total Protein: 7.1 g/dL (ref 6.5–8.1)

## 2019-03-26 MED ORDER — BENZTROPINE MESYLATE 1 MG PO TABS: 1.0000 mg | ORAL_TABLET | Freq: Every day | ORAL | 0 refills | Status: DC

## 2019-03-26 MED ORDER — BENZTROPINE MESYLATE 1 MG PO TABS
1.0000 mg | ORAL_TABLET | Freq: Once | ORAL | Status: AC
  Administered 2019-03-26: 05:00:00 1 mg via ORAL
  Filled 2019-03-26: qty 1

## 2019-03-26 NOTE — ED Notes (Addendum)
Call from 1st RN, taxi here, Pt wheeled to toilet then loaded in cab, cell phone, keys, charger, purse and meds given to pt in belongings bag  No peripheral IV placed this visit.   Discharge instructions reviewed with patient. Questions fielded by this RN. Patient verbalizes understanding of instructions. Patient discharged home in stable condition per brown. No acute distress noted at time of discharge.

## 2019-03-26 NOTE — ED Provider Notes (Signed)
[name] Emergency Department Provider Note _   First MD Initiated Contact with Patient 03/26/19 [phone]     (approximate)  I have reviewed the triage vital signs and the nursing notes.   HISTORY  Chief Complaint Parkinson's Flare    HPI [name] is a 63 y.o. female with below list of previous medical conditions including Parkinson's disease presents to the emergency department secondary to worsening tremors over the past 3 weeks.  Patient states that she has an appointment with Dr. Brigitte Pulse her neurologist on Tuesday however tremors have become more considerable prompting her visit to the emergency department tonight.  Patient denies any recent illness no fever no cough no vomiting or diarrhea.  Patient denies any urinary symptoms.        Past Medical History:  Diagnosis Date  . Anxiety   . Anxiety    panic anxiety disorder  . Arthritis   . Asthma    chronic asthmatic bronchitis  . Bipolar disorder ([place])   . Cancer [facility])    Desmoid tumor left forearm  . Depression   . ETOH abuse   . Heart murmur   . Insomnia   . Neuropathy     Patient Active Problem List   Diagnosis Date Noted  . Back pain 03/16/2019  . Constipation 03/16/2019  . Parkinson disease ([place]) 03/16/2019  . Murmur 03/16/2019  . Neck pain 03/16/2019  . 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 Essential hypertension 02/22/2019  . At high risk for falls 01/25/2019  . Drug-induced tremor 01/25/2019  . Essential tremor 01/25/2019  . 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 Displacement of internal left hip prosthesis ([place]) 11/29/2018  . Hip dislocation, left ([place]) 11/29/2018  . Hip dislocation, left, sequela 08/06/2018  . High anion gap metabolic acidosis 99991111  . History of alcohol abuse 10/09/2016  . Neuropathy of left upper extremity 10/09/2016  . Urinary incontinence, mixed 10/09/2016  . Overdose of  antidepressant 07/03/2016  . 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 Bipolar I disorder, most recent episode mixed, severe with psychotic features ([place]) 07/03/2015  . Alcohol use disorder, severe, dependence ([place]) 01/26/2015  . Tobacco use disorder 01/26/2015  . Nicotine dependence, uncomplicated [id number]  . Alcohol withdrawal ([place]) 01/08/2015  . 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 Suicidal ideation 12/19/2014  . Alcohol use 10/30/2014  . Anxiety 10/30/2014  . Depression 10/30/2014  . GAD (generalized anxiety disorder) 12/24/2013  . PTSD (post-traumatic stress disorder) 12/22/2013    Past Surgical History:  Procedure Laterality Date  . ABDOMINAL HYSTERECTOMY    . ABDOMINAL HYSTERECTOMY  2007  . 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 ARM AMPUTATION AT SHOULDER Left   . CESAREAN SECTION    . [place]   x 2  . COLONOSCOPY    . ESOPHAGOGASTRODUODENOSCOPY (EGD) WITH PROPOFOL N/A 04/27/2018   Procedure: ESOPHAGOGASTRODUODENOSCOPY (EGD) WITH PROPOFOL;  Surgeon: Toledo, Benay Pike, MD;  Location: [facility] ENDOSCOPY;  Service: Gastroenterology;  Laterality: N/A;  . JOINT REPLACEMENT Right 2012   hip  . LUMBAR LAMINECTOMY/DECOMPRESSION MICRODISCECTOMY Left 04/16/2016   Procedure: LUMBAR LAMINECTOMY/DECOMPRESSION MICRODISCECTOMY 1 LEVEL;  Surgeon: Meade Maw, MD;  Location: [facility] ORS;  Service: Neurosurgery;  Laterality: Left;  Left L4-5 far lateral discectomy, left L4-5 laminoforaminotomy  . TOTAL HIP ARTHROPLASTY Left 09/17/2015   Procedure:  TOTAL HIP ARTHROPLASTY;  Surgeon: Dereck Leep, MD;  Location: [facility] ORS;  Service: Orthopedics;  Laterality: Left;  . TOTAL HIP REVISION Left 03/16/2019   Procedure: TOTAL HIP REVISION ARTHROPLASTY WITH CONVERSION TO A CONSTRAINED LINER.;  Surgeon: Dereck Leep, MD;  Location: [facility] ORS;  Service: Orthopedics;  Laterality: Left;  . WISDOM TOOTH EXTRACTION       Prior to Admission medications   Medication Sig Start Date End Date Taking? Authorizing Provider  acetaminophen (TYLENOL) 500 MG tablet Take 1,000 mg by mouth every 6 (six) hours as needed.   Yes [provider]  Amantadine HCl 100 MG tablet Take 200 mg by mouth 2 (two) times daily.  01/05/19  Yes [provider]  calcium carbonate (OS-CAL) 600 MG TABS tablet Take 600 mg by mouth daily with breakfast.   Yes [provider]  carbidopa-levodopa (SINEMET IR) 25-100 MG tablet Take 2 tablets by mouth 3 (three) times daily.    Yes [provider]  celecoxib (CELEBREX) 200 MG capsule Take 1 capsule (200 mg total) by mouth 2 (two) times daily. 03/17/19  Yes Reche Dixon, PA-C  gabapentin (NEURONTIN) 300 MG capsule Take 3 capsules (900 mg total) by mouth 3 (three) times daily. 05/21/17  Yes Pucilowska, [name], MD  hydrochlorothiazide (HYDRODIURIL) 25 MG tablet Take 25 mg by mouth daily. 12/30/18  Yes [provider]  propranolol (INDERAL) 10 MG tablet Take 1 tablet (10 mg total) by mouth 3 (three) times daily. Patient taking differently: Take 20 mg by mouth 3 (three) times daily.  05/21/17  Yes Pucilowska, [name], MD  traZODone (DESYREL) 150 MG tablet Take 300 mg by mouth at bedtime.    Yes [provider]  venlafaxine XR (EFFEXOR-XR) 150 MG 24 hr capsule Take 2 capsules (300 mg total) by mouth daily with breakfast. 11/29/18  Yes Fritzi Mandes, MD  clonazePAM (KLONOPIN) 0.5 MG tablet Take 1 tablet (0.5 mg total) by mouth 2 (two) times daily as needed for anxiety. 12/31/18   Carrie Mew, MD  cyclobenzaprine (FLEXERIL) 5 MG tablet Take 5 mg by mouth 3 (three) times daily as needed for muscle spasms.    [provider]  enoxaparin (LOVENOX) 40 MG/0.4ML injection Inject 0.4 mLs (40 mg total) into the skin daily for 14 doses. 03/17/19 03/31/19  Reche Dixon, PA-C  oxyCODONE (OXY IR/ROXICODONE) 5 MG immediate release tablet Take 1 tablet (5 mg  total) by mouth every 4 (four) hours as needed for moderate pain (pain score 4-6). 03/17/19   Reche Dixon, PA-C  traMADol (ULTRAM) 50 MG tablet Take 1-2 tablets (50-100 mg total) by mouth every 4 (four) hours as needed for moderate pain. 03/17/19   Reche Dixon, PA-C    Allergies Benadryl [diphenhydramine], Benadryl [diphenhydramine], Cephalosporins, and Codeine  Family History  Problem Relation Age of Onset  . COPD Mother     Social History Social History   Tobacco Use  . Smoking status: Current Every Day Smoker    Packs/day: 0.25    Types: Cigarettes  . Smokeless tobacco: Never Used  Substance Use Topics  . Alcohol use: Yes    Comment: 2 40oz beers daily  . Drug use: No    Comment: Patient denies     Review of Systems Constitutional: No fever/chills Eyes: No visual changes. ENT: No sore throat. Cardiovascular: Denies chest pain. Respiratory: Denies shortness of breath. Gastrointestinal: No abdominal pain.  No nausea, no vomiting.  No diarrhea.  No constipation. Genitourinary: Negative for dysuria.  Musculoskeletal: Negative for neck pain.  Negative for back pain. Integumentary: Negative for rash. Neurological: Negative for headaches, focal weakness or numbness.  Positive for tremors   ____________________________________________   PHYSICAL EXAM:  VITAL SIGNS: ED Triage Vitals  Enc Vitals Group     BP 03/25/19 2247 (!) 96/52     Pulse Rate 03/25/19 2247 88     Resp 03/25/19 2247 19     Temp 03/25/19 2247 98.2 F (36.8 C)     Temp Source 03/25/19 2247 Oral     SpO2 03/25/19 2247 95 %     Weight 03/25/19 2245 66.7 kg (147 lb)     Height 03/25/19 2245 1.626 m (5\' 4" )     Head Circumference --      Peak Flow --      Pain Score 03/25/19 2244 5     Pain Loc --      Pain Edu? --      Excl. in [place]? --     Constitutional: Alert and oriented.  Eyes: Conjunctivae are normal.  Head: Atraumatic. Mouth/Throat: Mucous membranes are moist. Neck: No stridor.  No  meningeal signs.   Cardiovascular: Normal rate, regular rhythm. Good peripheral circulation. Grossly normal heart sounds. Respiratory: Normal respiratory effort.  No retractions. Gastrointestinal: Soft and nontender. No distention.  Musculoskeletal: No lower extremity tenderness nor edema. No gross deformities of extremities. Neurologic:  Normal speech and language. No gross focal neurologic deficits are appreciated.  Skin:  Skin is warm, dry and intact. Psychiatric: Mood and affect are normal. Speech and behavior are normal.  ____________________________________________   LABS (all labs ordered are listed, but only abnormal results are displayed)  Labs Reviewed  CBC WITH DIFFERENTIAL/PLATELET - Abnormal; Notable for the following components:      Result Value   RBC 3.66 (*)    Hemoglobin 11.7 (*)    HCT 35.1 (*)    Abs Immature Granulocytes 0.38 (*)    All other components within normal limits  COMPREHENSIVE METABOLIC PANEL - Abnormal; Notable for the following components:   Sodium 133 (*)    AST 13 (*)    All other components within normal limits  URINALYSIS, ROUTINE W REFLEX MICROSCOPIC - Abnormal; Notable for the following components:   Color, Urine STRAW (*)    APPearance CLEAR (*)    Specific Gravity, Urine 1.004 (*)    All other components within normal limits    Procedures   ____________________________________________   INITIAL IMPRESSION / MDM / ASSESSMENT AND PLAN / ED COURSE  As part of my medical decision making, I reviewed the following data within the electronic MEDICAL RECORD NUMBER   63 year old female presented with above-stated history and physical exam secondary to worsening tremors.  I reviewed the patient's current parkinsonian medication regimen.  Patient will be given Cogentin 1 mg in the emergency department will prescribe 1 mg nightly.  Patient has an appointment scheduled with Dr. Manuella Ghazi her neurologist on Tuesday I encouraged her to keep that  appointment.      ____________________________________________  FINAL CLINICAL IMPRESSION(S) / ED DIAGNOSES  Final diagnoses:  Parkinson's disease ([place])     MEDICATIONS GIVEN DURING THIS VISIT:  Medications  benztropine (COGENTIN) tablet 1 mg (has no administration in time range)     ED Discharge Orders    None      *Please note:  [name] was evaluated in Emergency Department on 03/26/2019 for the symptoms described in the history of present illness. She was  evaluated in the context of the global COVID-19 pandemic, which necessitated consideration that the patient might be at risk for infection with the SARS-CoV-2 virus that causes COVID-19. Institutional protocols and algorithms that pertain to the evaluation of patients at risk for COVID-19 are in a state of rapid change based on information released by regulatory bodies including the CDC and federal and state organizations. These policies and algorithms were followed during the patient's care in the ED.  Some ED evaluations and interventions may be delayed as a result of limited staffing during the pandemic.*  Note:  This document was prepared using Dragon voice recognition software and may include unintentional dictation errors.   Gregor Hams, MD 03/26/19 [phone]

## 2019-03-26 NOTE — ED Notes (Signed)
Family at bedside. 

## 2019-03-26 NOTE — ED Notes (Addendum)
ED Provider at bedside.  Pt reports symptoms of weakness and twitching "when I fall I can't get up", pt reports last summer the jerking from the Parkinson's "was so bad I dislocated my hip"  appt to follow up with Dr Manuella Ghazi - neurologist, on this Tuesday  Pt was sleeping soundly

## 2019-03-28 ENCOUNTER — Emergency Department
Admission: EM | Admit: 2019-03-28 | Discharge: 2019-03-30 | Disposition: A | Discharge status: Home / Self Care | Payer: Medicare Other | Source: Home / Self Care | Attending: Emergency Medicine | Admitting: Emergency Medicine

## 2019-03-28 ENCOUNTER — Emergency Department: Payer: Medicare Other

## 2019-03-28 ENCOUNTER — Other Ambulatory Visit: Payer: Self-pay

## 2019-03-28 DIAGNOSIS — F1020 Alcohol dependence, uncomplicated: Secondary | ICD-10-CM | POA: Diagnosis present

## 2019-03-28 DIAGNOSIS — R410 Disorientation, unspecified: Secondary | ICD-10-CM

## 2019-03-28 DIAGNOSIS — S48112A Complete traumatic amputation at level between left shoulder and elbow, initial encounter: Secondary | ICD-10-CM

## 2019-03-28 DIAGNOSIS — Z7289 Other problems related to lifestyle: Secondary | ICD-10-CM

## 2019-03-28 DIAGNOSIS — Z9181 History of falling: Secondary | ICD-10-CM

## 2019-03-28 DIAGNOSIS — F419 Anxiety disorder, unspecified: Secondary | ICD-10-CM | POA: Diagnosis present

## 2019-03-28 DIAGNOSIS — N179 Acute kidney failure, unspecified: Secondary | ICD-10-CM | POA: Diagnosis not present

## 2019-03-28 DIAGNOSIS — F329 Major depressive disorder, single episode, unspecified: Secondary | ICD-10-CM | POA: Diagnosis present

## 2019-03-28 DIAGNOSIS — K5900 Constipation, unspecified: Secondary | ICD-10-CM | POA: Diagnosis present

## 2019-03-28 DIAGNOSIS — G251 Drug-induced tremor: Secondary | ICD-10-CM | POA: Diagnosis present

## 2019-03-28 DIAGNOSIS — F10239 Alcohol dependence with withdrawal, unspecified: Secondary | ICD-10-CM | POA: Diagnosis present

## 2019-03-28 DIAGNOSIS — T43201A Poisoning by unspecified antidepressants, accidental (unintentional), initial encounter: Secondary | ICD-10-CM | POA: Diagnosis present

## 2019-03-28 DIAGNOSIS — M549 Dorsalgia, unspecified: Secondary | ICD-10-CM | POA: Diagnosis present

## 2019-03-28 DIAGNOSIS — I10 Essential (primary) hypertension: Secondary | ICD-10-CM | POA: Diagnosis present

## 2019-03-28 DIAGNOSIS — R251 Tremor, unspecified: Secondary | ICD-10-CM

## 2019-03-28 DIAGNOSIS — F10939 Alcohol use, unspecified with withdrawal, unspecified: Secondary | ICD-10-CM | POA: Diagnosis present

## 2019-03-28 DIAGNOSIS — F32A Depression, unspecified: Secondary | ICD-10-CM | POA: Diagnosis present

## 2019-03-28 DIAGNOSIS — T84021A Dislocation of internal left hip prosthesis, initial encounter: Secondary | ICD-10-CM

## 2019-03-28 DIAGNOSIS — F411 Generalized anxiety disorder: Secondary | ICD-10-CM | POA: Diagnosis present

## 2019-03-28 DIAGNOSIS — G20 Parkinson's disease: Secondary | ICD-10-CM | POA: Diagnosis present

## 2019-03-28 DIAGNOSIS — Z789 Other specified health status: Secondary | ICD-10-CM

## 2019-03-28 LAB — COMPREHENSIVE METABOLIC PANEL
ALT: 15 U/L (ref 0–44)
AST: 17 U/L (ref 15–41)
Albumin: 4.2 g/dL (ref 3.5–5.0)
Alkaline Phosphatase: 89 U/L (ref 38–126)
Anion gap: 16 — ABNORMAL HIGH (ref 5–15)
BUN: 22 mg/dL (ref 8–23)
CO2: 19 mmol/L — ABNORMAL LOW (ref 22–32)
Calcium: 9.4 mg/dL (ref 8.9–10.3)
Chloride: 101 mmol/L (ref 98–111)
Creatinine, Ser: 1.1 mg/dL — ABNORMAL HIGH (ref 0.44–1.00)
GFR calc Af Amer: >60 mL/min (ref >60)
GFR calc non Af Amer: 53 mL/min — ABNORMAL LOW (ref >60)
Glucose, Bld: 89 mg/dL (ref 70–99)
Potassium: 3.9 mmol/L (ref 3.5–5.1)
Sodium: 136 mmol/L (ref 135–145)
Total Bilirubin: 1.1 mg/dL (ref 0.3–1.2)
Total Protein: 7.7 g/dL (ref 6.5–8.1)

## 2019-03-28 LAB — CBC
HCT: 36.7 % (ref 36.0–46.0)
Hemoglobin: 12.3 g/dL (ref 12.0–15.0)
MCH: 31.7 pg (ref 26.0–34.0)
MCHC: 33.5 g/dL (ref 30.0–36.0)
MCV: 94.6 fL (ref 80.0–100.0)
Platelets: 364 10*3/uL (ref 150–400)
RBC: 3.88 MIL/uL (ref 3.87–5.11)
RDW: 13.8 % (ref 11.5–15.5)
WBC: 14.2 10*3/uL — ABNORMAL HIGH (ref 4.0–10.5)
nRBC: 0 % (ref 0.0–0.2)

## 2019-03-28 LAB — ACETAMINOPHEN LEVEL: Acetaminophen (Tylenol), Serum: <10 ug/mL — ABNORMAL LOW (ref 10–30)

## 2019-03-28 LAB — SALICYLATE LEVEL: Salicylate Lvl: <7 mg/dL (ref 2.8–30.0)

## 2019-03-28 LAB — ETHANOL: Alcohol, Ethyl (B): <10 mg/dL (ref <10)

## 2019-03-28 LAB — SARS CORONAVIRUS 2 BY RT PCR (HOSPITAL ORDER, PERFORMED IN ~~LOC~~ HOSPITAL LAB): SARS Coronavirus 2: NEGATIVE

## 2019-03-28 NOTE — ED Triage Notes (Addendum)
Pt comes via [facility] from home with c/o AMS and hallucinations. EMS reports pt is not at her baseline and when they arrived on scene several medications were found all over the kitchen table.  EMS states home health called 911 because pt was not acting her baseline and seemed confused.  Pt unsure if she took any medications. Pt states she doesn't think she did. Pt able to state name and DOB. Pt denies any hallucinations, SI or HI at this time. Pt is currently shaking during dressing her out.  During assessment pt does have moments of AMS and unable to understand what she is trying to communicate.  Pt states she recently had left hip surgery last Wednesday. Pt has bandage in place on left hip. Pt also has bruise noted to belly and left lower back. Pt has duoderm located on left upper back.

## 2019-03-28 NOTE — ED Notes (Signed)
Ct hear for patient

## 2019-03-28 NOTE — ED Notes (Signed)
Security/police called to escort patient to ct.

## 2019-03-28 NOTE — ED Notes (Signed)
Received call from neighbor Sandi Raveling and she stated that she helps look in on pt. Per Katharine Look the pt was recently seen here in ED for excessive shaking with hx of Parkinsons Friday night. Pt was seen by MD Owens Shark and prescribed medications. Per Katharine Look the medication is now all gone and she is unsure if pt took them all. Friend also states that she found beer at the home and that she couldn't find any of the pt's Oxy medications.  Friend states that the pt doesn't have any family in [place] and that she thinks it is unsafe for the pt to go back home.

## 2019-03-28 NOTE — ED Notes (Signed)
Patient going to ct at this time

## 2019-03-28 NOTE — ED Notes (Signed)
Friend called (Ms. Ronnald Ramp) updated on patient status, stated that she has spare keys to patients home will go and feed her pets tonight. Patient was made aware, unsure patient was able to process this information.

## 2019-03-28 NOTE — ED Notes (Signed)
Psych consult completed awaiting plan of care.

## 2019-03-28 NOTE — ED Notes (Signed)
covid obtained and sent to lab.  

## 2019-03-28 NOTE — ED Notes (Signed)
Portable chest x ray completed.

## 2019-03-28 NOTE — ED Notes (Signed)
Psych consult in progress awaiting further plan of care. Safety maintained. Ct head completed results pending.

## 2019-03-28 NOTE — ED Notes (Signed)
Patient back from ct at this time

## 2019-03-28 NOTE — ED Notes (Addendum)
Assumed care of patient. Patient very confused having word salads unable to answer questions appropriately. Reports left hip pain patient has dressing over left hip, dressing looks cdi. Patient with left above elbow amputation noted. , but unable to state where she is what date it is and how she got to ed. Mumbling to herself. Poor historian. As per prior nurse patient from home. Neighbors called ems due to mental change. Unsure if patient ingested recently perscribed medications. Patient alert and oriented to name. Skin pink warm and dry. Vss. Patient in no obvious distress, laying in bed talking to herself. Awaiting medical clearance and psych consult.

## 2019-03-28 NOTE — ED Notes (Signed)
Pt undressed with assistance from EDT Felicia and clothes placed in pt belongings bags labeled.  2 yellow socks 1 brown shoe 1 [employer] shirt 1 [employer] pants 1 brown bra 2 rings

## 2019-03-28 NOTE — ED Notes (Signed)
Dinner tray provided. Patient pushed tray away,.

## 2019-03-28 NOTE — ED Provider Notes (Signed)
[facility] Emergency Department Provider Note   ____________________________________________   First MD Initiated Contact with Patient 03/28/19 1010     (approximate)  I have reviewed the triage vital signs and the nursing notes.   HISTORY  Chief Complaint Hallucinations    HPI [name] is a 63 y.o. female with past medical history of Parkinson's disease, anxiety, and bipolar disorder presents to the ED for hallucinations.  History is limited as patient is confused with disorganized speech pattern.  Per EMS, home health came to check on the patient earlier today and found her to be not acting like her usual self.  Patient now very confused and having difficulty answering questions appropriately.  She denies any complaints, denies taking more than her prescribed medications.  She was seen in the ED recently for increased tremulousness and started on Cogentin.  She also recently underwent left hip replacement.        Past Medical History:  Diagnosis Date  . Anxiety   . Anxiety    panic anxiety disorder  . Arthritis   . Asthma    chronic asthmatic bronchitis  . Bipolar disorder ([place])   . Cancer [name])    Desmoid tumor left forearm  . Depression   . ETOH abuse   . Heart murmur   . Insomnia   . Neuropathy     Patient Active Problem List   Diagnosis Date Noted  . Back pain 03/16/2019  . Constipation 03/16/2019  . Parkinson disease ([place]) 03/16/2019  . Murmur 03/16/2019  . Neck pain 03/16/2019  . 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 Essential hypertension 02/22/2019  . At high risk for falls 01/25/2019  . Drug-induced tremor 01/25/2019  . Essential tremor 01/25/2019  . 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 Dyskinesia due to Parkinson's disease ([place]) 01/06/2019  . Displacement of internal left hip prosthesis ([place]) 11/29/2018  . Hip dislocation, left ([place]) 11/29/2018  . Hip dislocation, left, sequela 08/06/2018  . High anion gap  metabolic acidosis 99991111  . History of alcohol abuse 10/09/2016  . Neuropathy of left upper extremity 10/09/2016  . Urinary incontinence, mixed 10/09/2016  . Overdose of antidepressant 07/03/2016  . 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 Bipolar I disorder, most recent episode mixed, severe with psychotic features ([place]) 07/03/2015  . Alcohol use disorder, severe, dependence ([place]) 01/26/2015  . Tobacco use disorder 01/26/2015  . Nicotine dependence, uncomplicated 123XX123  . Alcohol withdrawal ([place]) 01/08/2015  . 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 Suicidal ideation 12/19/2014  . Alcohol use 10/30/2014  . Anxiety 10/30/2014  . Depression 10/30/2014  . GAD (generalized anxiety disorder) 12/24/2013  . PTSD (post-traumatic stress disorder) 12/22/2013    Past Surgical History:  Procedure Laterality Date  . ABDOMINAL HYSTERECTOMY    . ABDOMINAL HYSTERECTOMY  2007  . 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 ARM AMPUTATION AT SHOULDER Left   . CESAREAN SECTION    . [place]   x 2  . COLONOSCOPY    . ESOPHAGOGASTRODUODENOSCOPY (EGD) WITH PROPOFOL N/A 04/27/2018   Procedure: ESOPHAGOGASTRODUODENOSCOPY (EGD) WITH PROPOFOL;  Surgeon: Toledo, Benay Pike, MD;  Location: [facility] ENDOSCOPY;  Service: Gastroenterology;  Laterality: N/A;  . JOINT REPLACEMENT Right 2012   hip  . LUMBAR LAMINECTOMY/DECOMPRESSION MICRODISCECTOMY Left 04/16/2016   Procedure: LUMBAR LAMINECTOMY/DECOMPRESSION MICRODISCECTOMY 1 LEVEL;  Surgeon: Meade Maw, MD;  Location:  [facility] ORS;  Service: Neurosurgery;  Laterality: Left;  Left L4-5 far lateral discectomy, left L4-5 laminoforaminotomy  . TOTAL HIP ARTHROPLASTY Left 09/17/2015   Procedure: TOTAL HIP ARTHROPLASTY;  Surgeon: Dereck Leep, MD;  Location: [facility] ORS;  Service: Orthopedics;  Laterality: Left;  . TOTAL HIP REVISION Left 03/16/2019   Procedure: TOTAL HIP REVISION  ARTHROPLASTY WITH CONVERSION TO A CONSTRAINED LINER.;  Surgeon: Dereck Leep, MD;  Location: [facility] ORS;  Service: Orthopedics;  Laterality: Left;  . WISDOM TOOTH EXTRACTION      Prior to Admission medications   Medication Sig Start Date End Date Taking? Authorizing Provider  acetaminophen (TYLENOL) 500 MG tablet Take 1,000 mg by mouth every 6 (six) hours as needed.    [provider]  Amantadine HCl 100 MG tablet Take 200 mg by mouth 2 (two) times daily.  01/05/19   [provider]  benztropine (COGENTIN) 1 MG tablet Take 1 tablet (1 mg total) by mouth at bedtime for 7 days. 03/26/19 04/02/19  Gregor Hams, MD  calcium carbonate (OS-CAL) 600 MG TABS tablet Take 600 mg by mouth daily with breakfast.    [provider]  carbidopa-levodopa (SINEMET IR) 25-100 MG tablet Take 2 tablets by mouth 3 (three) times daily.     [provider]  celecoxib (CELEBREX) 200 MG capsule Take 1 capsule (200 mg total) by mouth 2 (two) times daily. 03/17/19   Reche Dixon, PA-C  clonazePAM (KLONOPIN) 0.5 MG tablet Take 1 tablet (0.5 mg total) by mouth 2 (two) times daily as needed for anxiety. 12/31/18   Carrie Mew, MD  cyclobenzaprine (FLEXERIL) 5 MG tablet Take 5 mg by mouth 3 (three) times daily as needed for muscle spasms.    [provider]  enoxaparin (LOVENOX) 40 MG/0.4ML injection Inject 0.4 mLs (40 mg total) into the skin daily for 14 doses. 03/17/19 03/31/19  Reche Dixon, PA-C  gabapentin (NEURONTIN) 300 MG capsule Take 3 capsules (900 mg total) by mouth 3 (three) times daily. 05/21/17   Pucilowska, [name], MD  hydrochlorothiazide (HYDRODIURIL) 25 MG tablet Take 25 mg by mouth daily. 12/30/18   [provider]  oxyCODONE (OXY IR/ROXICODONE) 5 MG immediate release tablet Take 1 tablet (5 mg total) by mouth every 4 (four) hours as needed for moderate pain (pain score 4-6). 03/17/19   Reche Dixon, PA-C  propranolol (INDERAL) 10 MG tablet Take 1 tablet  (10 mg total) by mouth 3 (three) times daily. Patient taking differently: Take 20 mg by mouth 3 (three) times daily.  05/21/17   Pucilowska, Wardell Honour, MD  traMADol (ULTRAM) 50 MG tablet Take 1-2 tablets (50-100 mg total) by mouth every 4 (four) hours as needed for moderate pain. 03/17/19   Reche Dixon, PA-C  traZODone (DESYREL) 150 MG tablet Take 300 mg by mouth at bedtime.     [provider]  venlafaxine XR (EFFEXOR-XR) 150 MG 24 hr capsule Take 2 capsules (300 mg total) by mouth daily with breakfast. 11/29/18   Fritzi Mandes, MD    Allergies Benadryl [diphenhydramine], Benadryl [diphenhydramine], Cephalosporins, and Codeine  Family History  Problem Relation Age of Onset  . COPD Mother     Social History Social History   Tobacco Use  . Smoking status: Current Every Day Smoker    Packs/day: 0.25    Types: Cigarettes  . Smokeless tobacco: Never Used  Substance Use Topics  . Alcohol use: Yes    Comment: 2 40oz beers daily  . Drug use:  No    Comment: Patient denies     Review of Systems Unable to obtain secondary to altered mental status  ____________________________________________   PHYSICAL EXAM:  VITAL SIGNS: ED Triage Vitals  Enc Vitals Group     BP 03/28/19 1042 (!) 135/92     Pulse Rate 03/28/19 1042 90     Resp 03/28/19 1042 18     Temp 03/28/19 1042 98.6 F (37 C)     Temp Source 03/28/19 1226 Oral     SpO2 03/28/19 1042 94 %     Weight 03/28/19 1042 147 lb (66.7 kg)     Height 03/28/19 1042 5\' 4"  (1.626 m)     Head Circumference --      Peak Flow --      Pain Score 03/28/19 1025 8     Pain Loc --      Pain Edu? --      Excl. in [place]? --     Constitutional: Alert and disoriented. Eyes: Conjunctivae are normal. Head: Atraumatic. Nose: No congestion/rhinnorhea. Mouth/Throat: Mucous membranes are moist. Neck: Normal ROM Cardiovascular: Normal rate, regular rhythm. Grossly normal heart sounds. Respiratory: Normal respiratory effort.  No  retractions. Lungs CTAB. Gastrointestinal: Soft and nontender. No distention. Genitourinary: deferred Musculoskeletal: No lower extremity tenderness nor edema.  Left hip incision clean, dry, and intact with no associated tenderness, erythema, or warmth. Neurologic:  Normal speech and language. No gross focal neurologic deficits are appreciated. Skin:  Skin is warm, dry and intact. No rash noted. Psychiatric: Disorganized speech.  ____________________________________________   LABS (all labs ordered are listed, but only abnormal results are displayed)  Labs Reviewed  COMPREHENSIVE METABOLIC PANEL - Abnormal; Notable for the following components:      Result Value   CO2 19 (*)    Creatinine, Ser 1.10 (*)    GFR calc non Af Amer 53 (*)    Anion gap 16 (*)    All other components within normal limits  CBC - Abnormal; Notable for the following components:   WBC 14.2 (*)    All other components within normal limits  ACETAMINOPHEN LEVEL - Abnormal; Notable for the following components:   Acetaminophen (Tylenol), Serum <10 (*)    All other components within normal limits  SARS CORONAVIRUS 2 (HOSPITAL ORDER, [place])  ETHANOL  SALICYLATE LEVEL  URINE DRUG SCREEN, QUALITATIVE ([facility] ONLY)  URINALYSIS, COMPLETE ([facility]) WITH MICROSCOPIC    PROCEDURES  Procedure(s) performed (including Critical Care):  Procedures   ____________________________________________   INITIAL IMPRESSION / ASSESSMENT AND PLAN / ED COURSE       63 year old female with history of Parkinson disease presents to the ED for altered mental status and increased confusion.  She is tremulous but with a nonfocal neurologic exam, head CT performed and negative for acute process.  She is awake and alert, but disoriented with nonsensical speech pattern.  Labs are unremarkable.  Unclear if this is related to medication effect, but no apparent infectious process.  Patient does have psychiatric  history, will ask psychiatry to evaluate.      ____________________________________________   FINAL CLINICAL IMPRESSION(S) / ED DIAGNOSES  Final diagnoses:  Confusion     ED Discharge Orders    None       Note:  This document was prepared using Dragon voice recognition software and may include unintentional dictation errors.   Blake Divine, MD 03/28/19 [phone]

## 2019-03-29 MED ORDER — CELECOXIB 200 MG PO CAPS
200.0000 mg | ORAL_CAPSULE | Freq: Once | ORAL | Status: AC
  Administered 2019-03-29: 14:00:00 200 mg via ORAL
  Filled 2019-03-29: qty 1

## 2019-03-29 MED ORDER — CLONAZEPAM 0.5 MG PO TABS
0.5000 mg | ORAL_TABLET | Freq: Two times a day (BID) | ORAL | Status: DC
  Administered 2019-03-29 – 2019-03-30 (×2): 0.5 mg via ORAL
  Filled 2019-03-29 (×2): qty 1

## 2019-03-29 MED ORDER — AMANTADINE HCL 100 MG PO CAPS
100.0000 mg | ORAL_CAPSULE | Freq: Two times a day (BID) | ORAL | Status: DC
  Administered 2019-03-29 – 2019-03-30 (×3): 100 mg via ORAL
  Filled 2019-03-29 (×4): qty 1

## 2019-03-29 MED ORDER — CARBIDOPA-LEVODOPA 25-100 MG PO TABS
1.0000 | ORAL_TABLET | Freq: Three times a day (TID) | ORAL | Status: DC
  Administered 2019-03-29 – 2019-03-30 (×2): 1 via ORAL
  Filled 2019-03-29 (×6): qty 1

## 2019-03-29 MED ORDER — TRAMADOL HCL 50 MG PO TABS
50.0000 mg | ORAL_TABLET | ORAL | Status: DC | PRN
  Administered 2019-03-29 (×2): 50 mg via ORAL
  Filled 2019-03-29 (×2): qty 1

## 2019-03-29 MED ORDER — GABAPENTIN 600 MG PO TABS
600.0000 mg | ORAL_TABLET | Freq: Three times a day (TID) | ORAL | Status: DC
  Administered 2019-03-29 – 2019-03-30 (×3): 600 mg via ORAL
  Filled 2019-03-29 (×3): qty 1

## 2019-03-29 NOTE — ED Provider Notes (Signed)
-----------------------------------------   8:10 PM on 03/29/2019 -----------------------------------------  Patient has been seen by psychiatry, they essentially state there is nothing for them to do for the patient.  Patient has been seen by PT who recommends continued home health care does not recommend rehab or SNF.  I have personally seen the patient, she still appears very anxious however otherwise she appears well and she wants to go home.  However the patient states no one can pick her up tonight.  States she does not have a key to her house and the paramedics made her locked the door when she left.  States she does not have a way to get back into the house tonight.  Given these findings I believe it is reasonable to let the patient stay in the emergency department tonight and attempt to discharge tomorrow when she can contact friends/family to help her.   Harvest Dark, MD 03/29/19 2111

## 2019-03-29 NOTE — ED Notes (Signed)
Pt can ambulate to bedside toilet with assistance. Pt very shaky when standing. Tech forgot to get sample of urine before dumping.  Urine cup ready with label.  lmedt

## 2019-03-29 NOTE — ED Notes (Signed)
Pt given breakfast tray but did not want anything in it. Pt given cup of water and milk per pt request. Pt stated "I need my meds. I did not get any meds last night and I need my meds for the shaking now."

## 2019-03-29 NOTE — ED Notes (Signed)
Spoke with Sandi Raveling [phone])  per patient request, Ms. Ronnald Ramp is a friend of patients and she takes patients to her appointments. Ms. Ronnald Ramp states that patient should not be able to go back to her home, as she lives by herself and only has two cats. Ms. Ronnald Ramp said that patient had an appointment with Dr. Brigitte Pulse (neurology) today and Dr. Rogers Blocker (orthopedic) tomorrow Ms. Jones gave me the number to patients son Lonny Widdowson [phone]

## 2019-03-29 NOTE — ED Notes (Signed)
Patient assisted to bedside toilet by this Probation officer and ED tech Mickel Baas.

## 2019-03-29 NOTE — ED Notes (Signed)
Patient was due to be discharged but after speaking with her friend Katharine Look and her son [name], patient was not going to have any assistance at home. Patient son lives in [place] and is to far to assist and does not appear to be in any rush on getting patient any in home assistance or placing her in an assistant living facility. Patient friend Katharine Look said that she was tired and assisting patient was becoming to much for her

## 2019-03-29 NOTE — ED Notes (Signed)
Hourly rounding reveals patient in room. No complaints, stable, in no acute distress. Q15 minute rounds and monitoring via Rover and Officer to continue.   

## 2019-03-29 NOTE — ED Notes (Signed)
Patient is only able to stand with 2 person assist, patient gait is very unsteady and wobbly.

## 2019-03-29 NOTE — ED Notes (Signed)
Offered pt a shower and pt refused. Pt stated "I will take a shower at home with my aide. I don't have nothing here to take a shower." Pt made aware that we have supplies here for her to shower and pt still refusing.

## 2019-03-29 NOTE — ED Provider Notes (Signed)
Family is unwilling to pick up this patient to being cleared by psychiatry.  She is quite unsteady on her feet.  I will ask PT and social work to see her as do not feel she is safe for independent discharge at this time   Lavonia Drafts, MD 03/29/19 1348

## 2019-03-29 NOTE — ED Notes (Addendum)
Hourly rounding reveals patient in room. No complaints, stable, in no acute distress. Q15 minute rounds and monitoring via Rover and Officer to continue.   

## 2019-03-29 NOTE — ED Notes (Signed)
Pt was given a remote to the tv. This tech turned volume up on the tv and pt insisted the remote did not work. Pt was shown how to use the remote and pt still insisted the remote did not work and was requesting a remote that does work. Pt in rm yelling. Donneta Romberg, RN at bedside talking to pt. Pt stated "I am going to find a way to get out of here rather I have walk out of here or wheel out in the wheel chair. If I have to be here I should be able to watch tv." TV is on.

## 2019-03-29 NOTE — ED Notes (Signed)
Pt was seen by PT and was very anxious and began to become agitated when PT asked her to stand. Patient became very wobbly and shaky.

## 2019-03-29 NOTE — ED Notes (Signed)
BEHAVIORAL HEALTH ROUNDING Patient sleeping: No. Patient alert and oriented: yes Behavior appropriate: Yes.  ; If no, describe:  Nutrition and fluids offered: yes Toileting and hygiene offered: Yes  Sitter present: q15 minute observations and security monitoring Law enforcement present: Yes    

## 2019-03-29 NOTE — TOC Initial Note (Signed)
Transition of Care [name]) - Initial/Assessment Note    Patient Details  Name: [name] MRN: [id number] Date of Birth: 03-24-1956  [facility] CM/SW Contact:    Fredric Mare, LCSW Phone Number: 03/29/2019, 2:37 PM  Clinical Narrative:                 Patient is a 63 year old female that presents to the ED for hallucinations. Patient currently lives alone in an apartment with her pets. Patient's neighbor, Sandi Raveling, comes to check up on her often.  Patient reports that she had hip surgery last Wednesday, and has difficulty walking. EDP has ordered PT consult. Patient uses a cane, bedside commode, and  rolling walker at home. Patient shared that she is open to going to SNF, however she is unsure if her neighbor will take care of her animals.  CSW awaiting PT recommendations.   Expected Discharge Plan: Skilled Nursing Facility Barriers to Discharge: Ship broker, Continued Medical Work up   Patient Goals and CMS Choice        Expected Discharge Plan and Services Expected Discharge Plan: [place]   Discharge Planning Services: CM Consult Post Acute Care Choice: Skilled Nursing Facility(patient possibly open to SNF as long as she has someone to take care of her animals)                                        Prior Living Arrangements/Services   Lives with:: Self(patient has a neighbor, Sandi Raveling, that checks up on her often.) Patient language and need for interpreter reviewed:: Yes        Need for Family Participation in Patient Care: No (Comment) Care giver support system in place?: Yes (comment)(patient has home health and a neighbor that checks on her regularly) Current home services: DME, Home PT, Homehealth aide(patient has a rolling walker, bedside commode, and cane.  patient is open with [facility]) Criminal Activity/Legal Involvement Pertinent to Current Situation/Hospitalization: No - Comment as  needed  Activities of Daily Living      Permission Sought/Granted   Permission granted to share information with : Yes, Verbal Permission Granted  Share Information with NAME: Sandi Raveling     Permission granted to share info w Relationship: Neighbor     Emotional Assessment Appearance:: Appears stated age   Affect (typically observed): Anxious   Alcohol / Substance Use: Alcohol Use, Tobacco Use Psych Involvement: Yes (comment)  Admission diagnosis:  beh med eval ems Patient Active Problem List   Diagnosis Date Noted  . Back pain 03/16/2019  . Constipation 03/16/2019  . Parkinson disease ([place]) 03/16/2019  . Murmur 03/16/2019  . Neck pain 03/16/2019  . 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 Essential hypertension 02/22/2019  . At high risk for falls 01/25/2019  . Drug-induced tremor 01/25/2019  . Essential tremor 01/25/2019  . 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 Displacement of internal left hip prosthesis ([place]) 11/29/2018  . Hip dislocation, left ([place]) 11/29/2018  . Hip dislocation, left, sequela 08/06/2018  . High anion gap metabolic acidosis 99991111  . History of alcohol abuse 10/09/2016  . Neuropathy of left upper extremity 10/09/2016  . Urinary incontinence, mixed 10/09/2016  . Overdose of antidepressant 07/03/2016  . 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 Bipolar I disorder, most recent  episode mixed, severe with psychotic features ([place]) 07/03/2015  . Alcohol use disorder, severe, dependence ([place]) 01/26/2015  . Tobacco use disorder 01/26/2015  . Nicotine dependence, uncomplicated 123XX123  . Alcohol withdrawal ([place]) 01/08/2015  . 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 Suicidal ideation 12/19/2014  . Alcohol use 10/30/2014  . Anxiety 10/30/2014  . Depression 10/30/2014  . GAD (generalized anxiety disorder) 12/24/2013  . PTSD (post-traumatic  stress disorder) 12/22/2013   PCP:  Dion Body, MD Pharmacy:   [facility] [place], [place] - [address] [place] [address] Phone: [phone] Fax: [phone]  [id number] - [address], [place] [address] Phone: [phone] Fax: [phone]     Social Determinants of Health (SDOH) Interventions    Readmission Risk Interventions No flowsheet data found.

## 2019-03-29 NOTE — Consult Note (Signed)
[name] Face-to-Face Psychiatry Consult   Reason for Consult:  AMS Referring Physician:  Dr. 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 female patient presented to [name] ED via [facility] from home with complaints of altered mental status (AMS) and hallucinations. Per EMS, the patient is not at her baseline, and when they arrived at the scene, they located throughout the patient kitchen medications was found to be all over her kitchen table.  The patient states she lives alone and does not know what happened for her to come to the hospital.  During her assessment, she vaguely answers questions presented to her.  The patient was seen face-to-face by this provider; chart reviewed and consulted with Dr. Joan Mayans on 03/28/2019 due to the care of the patient. It was discussed with the EDP that the patient would be reassessed in the a.m to determine if she is at baseline or meets the criteria to be admitted to the psychiatric inpatient unit.  On evaluation, the patient is alert and oriented x 1-2; she is anxious, uncooperative, and mood-congruent with affect. The patient does  appear to be responding to internal and external stimuli. She is presenting with delusional thinking. The patient denies auditory or visual hallucinations. The patient denies suicidal, homicidal, or self-harm ideations. The patient is presenting with psychotic behaviors. During an encounter with the patient, she was unable to answer most questions appropriately.  Plan: The patient will be reassessed in the a.m to determine if she is at baseline or meets criteria to be admitted to the psychiatric inpatient unit.    HPI: Per Dr. Charna Archer; [name] is a 63 y.o. female with past medical history of Parkinson's disease, anxiety, and bipolar disorder presents to the ED for hallucinations.  History is limited as patient is confused with disorganized speech pattern.  Per EMS, home health came to check on the patient earlier today and found her to be not acting like her usual self.  Patient now very confused and having difficulty answering questions appropriately.  She denies any complaints, denies taking more than her prescribed medications.  She was seen in the ED recently for increased tremulousness and started on Cogentin.  She also recently underwent left hip replacement.  Past Psychiatric History:  Anxiety Panic anxiety disorder Bipolar disorder ([place]) Depression ETOH abuse Insomnia Risk to Self:  Yes (due to her altered mental status) Risk to Others:  No Prior Inpatient Therapy:  Yes Prior Outpatient Therapy:  Yes  Past Medical History:  Past Medical History:  Diagnosis Date  . Anxiety   . Anxiety    panic anxiety disorder  . Arthritis   .  Asthma    chronic asthmatic bronchitis  . Bipolar disorder ([place])   . Cancer [facility])    Desmoid tumor left forearm  . Depression   . ETOH abuse   . Heart murmur   . Insomnia   . Neuropathy     Past Surgical History:  Procedure Laterality Date  . ABDOMINAL HYSTERECTOMY    . ABDOMINAL HYSTERECTOMY  2007  . 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 ARM AMPUTATION AT SHOULDER Left   . CESAREAN SECTION    . [place]   x 2  . COLONOSCOPY    . ESOPHAGOGASTRODUODENOSCOPY (EGD) WITH PROPOFOL N/A 04/27/2018   Procedure: ESOPHAGOGASTRODUODENOSCOPY (EGD) WITH PROPOFOL;  Surgeon: Toledo, Benay Pike, MD;  Location: [facility] ENDOSCOPY;  Service: Gastroenterology;  Laterality: N/A;  . JOINT REPLACEMENT Right 2012   hip  . LUMBAR LAMINECTOMY/DECOMPRESSION MICRODISCECTOMY Left 04/16/2016   Procedure: LUMBAR LAMINECTOMY/DECOMPRESSION MICRODISCECTOMY 1 LEVEL;  Surgeon: Meade Maw, MD;  Location: [facility] ORS;  Service: Neurosurgery;  Laterality: Left;  Left L4-5 far lateral discectomy, left L4-5 laminoforaminotomy  . TOTAL HIP ARTHROPLASTY Left 09/17/2015   Procedure: TOTAL HIP ARTHROPLASTY;  Surgeon: Dereck Leep, MD;  Location: [facility] ORS;  Service: Orthopedics;  Laterality: Left;  . TOTAL HIP REVISION Left 03/16/2019   Procedure: TOTAL HIP REVISION ARTHROPLASTY WITH CONVERSION TO A CONSTRAINED LINER.;  Surgeon: Dereck Leep, MD;  Location: [facility] ORS;  Service: Orthopedics;  Laterality: Left;  . WISDOM TOOTH EXTRACTION     Family History:  Family History  Problem Relation Age of Onset  . COPD Mother    Family Psychiatric  History:  Social History:  Social History   Substance and Sexual Activity  Alcohol Use Yes   Comment: 2 40oz beers daily     Social History   Substance and Sexual Activity  Drug Use No   Comment: Patient denies     Social History   Socioeconomic History  . Marital status: Married    Spouse name: Not on file  . Number of children: Not on file  . Years of education: Not on file  . Highest education level: Not on file  Occupational History  . Not on file  Social Needs  . Financial resource strain: Not on file  . Food insecurity    Worry: Not on file    Inability: Not on file  . Transportation needs    Medical: Not on file    Non-medical: Not on file  Tobacco Use  .  Smoking status: Current Every Day Smoker    Packs/day: 0.25    Types: Cigarettes  . Smokeless tobacco: Never Used  Substance and Sexual Activity  . Alcohol use: Yes    Comment: 2 40oz beers daily  . Drug use: No    Comment: Patient denies   . Sexual activity: Yes  Lifestyle  . Physical activity    Days per week: Not on file    Minutes per session: Not on file  . Stress: Not on file  Relationships  . Social Herbalist on phone: Not on file    Gets together: Not on file    Attends religious service: Not on file    Active member of club or organization: Not on file    Attends meetings of clubs or organizations: Not on file    Relationship status: Not on file  Other Topics Concern  . Not on file  Social History Narrative   ** Merged History Encounter **       Additional Social History:    Allergies:   Allergies  Allergen Reactions  . Benadryl [Diphenhydramine] Hives and Other (See Comments)    Reaction:  Hyperactivity   . Benadryl [Diphenhydramine] Other (See Comments)    My jaw locked and I could not speak, like a stroke.  . Cephalosporins Hives  . Codeine Nausea And Vomiting    Labs:  Results for orders placed or performed during the hospital encounter of 03/28/19 (from the past 48 hour(s))  Comprehensive metabolic panel     Status: Abnormal   Collection Time: 03/28/19 10:44 AM  Result Value Ref Range   Sodium 136 135 - 145 mmol/L   Potassium 3.9 3.5 - 5.1 mmol/L   Chloride 101 98 - 111 mmol/L   CO2 19 (L) 22 - 32 mmol/L   Glucose, Bld 89 70 - 99 mg/dL   BUN 22 8 - 23 mg/dL   Creatinine, Ser 1.10 (H) 0.44 - 1.00 mg/dL   Calcium 9.4 8.9 - 10.3 mg/dL   Total Protein 7.7 6.5 - 8.1 g/dL   Albumin 4.2 3.5 - 5.0 g/dL   AST 17 15 - 41 U/L   ALT 15 0 - 44 U/L   Alkaline Phosphatase 89 38 - 126 U/L   Total Bilirubin 1.1 0.3 - 1.2 mg/dL   GFR calc non Af Amer 53 (L) >60 mL/min   GFR calc Af Amer >60 >60 mL/min   Anion gap 16 (H) 5 - 15    Comment:  Performed at [facility], [address] [place], [place] [address]  Ethanol     Status: None   Collection Time: 03/28/19 10:44 AM  Result Value Ref Range   Alcohol, Ethyl (B) <10 <10 mg/dL    Comment: (NOTE) Lowest detectable limit for serum alcohol is 10 mg/dL. For medical purposes only. Performed at [facility], [address] [place], [place] [address]   cbc     Status: Abnormal   Collection Time: 03/28/19 10:44 AM  Result Value Ref Range   WBC 14.2 (H) 4.0 - 10.5 K/uL   RBC 3.88 3.87 - 5.11 MIL/uL   Hemoglobin 12.3 12.0 - 15.0 g/dL   HCT 36.7 36.0 - 46.0 %   MCV 94.6 80.0 - 100.0 fL   MCH 31.7 26.0 - 34.0 pg   MCHC 33.5 30.0 - 36.0 g/dL   RDW 13.8 11.5 - 15.5 %   Platelets 364 150 - 400 K/uL   nRBC 0.0 0.0 - 0.2 %    Comment: Performed at [facility], [address] [place], [place] [id number]  Salicylate level     Status: None   Collection Time: 03/28/19 10:56 AM  Result Value Ref Range   Salicylate Lvl [id number] 2.8 - 30.0 mg/dL    Comment: Performed at [facility], [address] [place], [place] [address]  Acetaminophen level     Status: Abnormal   Collection Time: 03/28/19 10:56 AM  Result Value Ref Range   Acetaminophen (Tylenol), Serum <10 (L) 10 - 30 ug/mL    Comment: (NOTE) Therapeutic concentrations vary significantly. A range of 10-30 ug/mL  may be an effective concentration for many patients. However, some  are best treated at concentrations outside of this range. Acetaminophen concentrations >150 ug/mL at 4 hours after ingestion  and >50 ug/mL at 12 hours after ingestion are often associated with  toxic reactions. Performed  at [facility], [address] [place], [place] [address]   SARS Coronavirus 2 [name] order, Performed in [facility] hospital lab) Nasopharyngeal Nasopharyngeal Swab     Status: None   Collection Time: 03/28/19  3:07 PM   Specimen: Nasopharyngeal Swab  Result Value Ref Range    SARS Coronavirus 2 NEGATIVE NEGATIVE    Comment: (NOTE) If result is NEGATIVE SARS-CoV-2 target nucleic acids are NOT DETECTED. The SARS-CoV-2 RNA is generally detectable in upper and lower  respiratory specimens during the acute phase of infection. The lowest  concentration of SARS-CoV-2 viral copies this assay can detect is 250  copies / mL. A negative result does not preclude SARS-CoV-2 infection  and should not be used as the sole basis for treatment or other  patient management decisions.  A negative result may occur with  improper specimen collection / handling, submission of specimen other  than nasopharyngeal swab, presence of viral mutation(s) within the  areas targeted by this assay, and inadequate number of viral copies  (<250 copies / mL). A negative result must be combined with clinical  observations, patient history, and epidemiological information. If result is POSITIVE SARS-CoV-2 target nucleic acids are DETECTED. The SARS-CoV-2 RNA is generally detectable in upper and lower  respiratory specimens dur ing the acute phase of infection.  Positive  results are indicative of active infection with SARS-CoV-2.  Clinical  correlation with patient history and other diagnostic information is  necessary to determine patient infection status.  Positive results do  not rule out bacterial infection or co-infection with other viruses. If result is PRESUMPTIVE POSTIVE SARS-CoV-2 nucleic acids MAY BE PRESENT.   A presumptive positive result was obtained on the submitted specimen  and confirmed on repeat testing.  While 2019 novel coronavirus  (SARS-CoV-2) nucleic acids may be present in the submitted sample  additional confirmatory testing may be necessary for epidemiological  and / or clinical management purposes  to differentiate between  SARS-CoV-2 and other Sarbecovirus currently known to infect humans.  If clinically indicated additional testing with an alternate test   methodology [phone]) is advised. The SARS-CoV-2 RNA is generally  detectable in upper and lower respiratory sp ecimens during the acute  phase of infection. The expected result is Negative. Fact Sheet for Patients:  StrictlyIdeas.no Fact Sheet for Healthcare Providers: BankingDealers.co.za This test is not yet approved or cleared by the [place] FDA and has been authorized for detection and/or diagnosis of SARS-CoV-2 by FDA under an Emergency Use Authorization (EUA).  This EUA will remain in effect (meaning this test can be used) for the duration of the COVID-19 declaration under Section 564(b)(1) of the Act, 21 U.S.C. section 360bbb-3(b)(1), unless the authorization is terminated or revoked sooner. Performed at [facility], [address] [place], [place] [address]     No current facility-administered medications for this encounter.    Current Outpatient Medications  Medication Sig Dispense Refill  . acetaminophen (TYLENOL) 500 MG tablet Take 1,000 mg by mouth every 6 (six) hours as needed.    . Amantadine HCl 100 MG tablet Take 200 mg by mouth 2 (two) times daily.     . benztropine (COGENTIN) 1 MG tablet Take 1 tablet (1 mg total) by mouth at bedtime for 7 days. 30 tablet 0  . calcium carbonate (OS-CAL) 600 MG TABS tablet Take 600 mg by mouth daily with breakfast.    . carbidopa-levodopa (SINEMET IR) 25-100 MG tablet Take 2 tablets by mouth 3 (three)  times daily.     . celecoxib (CELEBREX) 200 MG capsule Take 1 capsule (200 mg total) by mouth 2 (two) times daily. 60 capsule 1  . clonazePAM (KLONOPIN) 0.5 MG tablet Take 1 tablet (0.5 mg total) by mouth 2 (two) times daily as needed for anxiety. 20 tablet 0  . cyclobenzaprine (FLEXERIL) 5 MG tablet Take 5 mg by mouth 3 (three) times daily as needed for muscle spasms.    Marland Kitchen enoxaparin (LOVENOX) 40 MG/0.4ML injection Inject 0.4 mLs (40 mg total) into the skin daily for 14  doses. 5.6 mL 0  . gabapentin (NEURONTIN) 300 MG capsule Take 3 capsules (900 mg total) by mouth 3 (three) times daily. 270 capsule 1  . hydrochlorothiazide (HYDRODIURIL) 25 MG tablet Take 25 mg by mouth daily.    Marland Kitchen oxyCODONE (OXY IR/ROXICODONE) 5 MG immediate release tablet Take 1 tablet (5 mg total) by mouth every 4 (four) hours as needed for moderate pain (pain score 4-6). 30 tablet 0  . propranolol (INDERAL) 10 MG tablet Take 1 tablet (10 mg total) by mouth 3 (three) times daily. (Patient taking differently: Take 20 mg by mouth 3 (three) times daily. ) 90 tablet 1  . traMADol (ULTRAM) 50 MG tablet Take 1-2 tablets (50-100 mg total) by mouth every 4 (four) hours as needed for moderate pain. 30 tablet 1  . traZODone (DESYREL) 150 MG tablet Take 300 mg by mouth at bedtime.     Marland Kitchen venlafaxine XR (EFFEXOR-XR) 150 MG 24 hr capsule Take 2 capsules (300 mg total) by mouth daily with breakfast. 30 capsule 0    Musculoskeletal: Strength & Muscle Tone: decreased Gait & Station: unsteady Patient leans: N/A  Psychiatric Specialty Exam: Physical Exam  Nursing note and vitals reviewed. Constitutional: She appears well-developed.  Neck: Normal range of motion. Neck supple.  Neurological: She is alert.    Review of Systems  Psychiatric/Behavioral: Positive for hallucinations. The patient is nervous/anxious.   All other systems reviewed and are negative.   Blood pressure (!) 148/90, pulse 74, temperature 98.4 F (36.9 C), temperature source Oral, resp. rate 18, height 5\' 4"  (1.626 m), weight 66.7 kg, SpO2 96 %.Body mass index is 25.23 kg/m.  General Appearance: Bizarre  Eye Contact:  Minimal  Speech:  Pressured  Volume:  Increased  Mood:  Angry, Anxious, Euphoric and Irritable  Affect:  Inappropriate  Thought Process:  Disorganized  Orientation:  Other:  Alert and oriented x1-2  Thought Content:  Illogical, Delusions and Paranoid Ideation  Suicidal Thoughts:  No  Homicidal Thoughts:  No   Memory:  Immediate;   Poor Recent;   Poor Remote;   Poor  Judgement:  Impaired  Insight:  Lacking  Psychomotor Activity:  Decreased  Concentration:  Concentration: Poor and Attention Span: Poor  Recall:  Poor  Fund of Knowledge:  Poor  Language:  Poor  Akathisia:  Negative  Handed:  Right  AIMS (if indicated):     Assets:  Desire for Improvement Physical Health Social Support  ADL's:  Intact  Cognition:  Impaired,  Moderate  Sleep:        Treatment Plan Summary: Daily contact with patient to assess and evaluate symptoms and progress in treatment, Medication management and Plan Patient needs to be reassess in the a.m. to determine if she meets criteria for psychiatric inpatient admission or could be discharged back home  Disposition: Supportive therapy provided about ongoing stressors. Patient is to be reassessed in the a.m. to determine if she meets  criteria for psychiatric inpatient admission or could be discharged back home.  Caroline Sauger, NP 03/29/2019 6:54 AM

## 2019-03-29 NOTE — Evaluation (Signed)
Physical Therapy Evaluation Patient Details Name: [name] MRN: [id number] DOB: 1956/03/28 Today's Date: 03/29/2019   History of Present Illness  Per ER MD note: Pt is a 63 y.o. female with past medical history of Parkinson's disease, anxiety, and bipolar disorder presents to the ED for hallucinations.  History is limited as patient is confused with disorganized speech pattern.  Per EMS, home health came to check on the patient earlier today and found her to be not acting like her usual self.  Patient now very confused and having difficulty answering questions appropriately.  She denies any complaints, denies taking more than her prescribed medications.  She was seen in the ED recently for increased tremulousness and started on Cogentin.  She also recently underwent left hip replacement.    Clinical Impression  Pt presented with high levels of anxiety and agitation throughout the session making a full PT evaluation impossible to perform.  Pt perseverated on her "panic attacks" and the need for various medications during the session.  Pt confused regarding her location and situation even forgetting which leg she had recent THA revision on.  Pt educated on PLB and significant time spent practicing the technique with the pt but with only temporary reduction in anxiety noted.  Pt was very difficult to redirect but ultimately with extensive encouragement the patient was able to get to sitting at the EOB and to stand from an elevated EOB without physical assistance with verbal cues for sequencing for hip precaution compliance.  Once in standing the pt was able to maintain her balance without physical assistance with CGA for safety and with a loftstrand crutch.  The patient's anxiety level increased once in standing and pt refused attempt at ambulation. Pt stated that she would not try to ambulate "until I get my meds" and quickly returned to sitting. Nursing in room and aware of pt's cognitive state.   Education/review provided to patient and patient's nurse of post hip precautions.  Education provided verbally and with visual demonstration on how to maintain hip precaution compliance during functional mobility.  Prior to this admission pt was receiving HHPT services for acute L THA revision.  Pt will benefit from continued HHPT services upon discharge to address deficits associated with acute hip replacement surgery for decreased caregiver assistance and eventual return to PLOF.       Follow Up Recommendations Home health PT;Supervision/Assistance - 24 hour    Equipment Recommendations  None recommended by PT    Recommendations for Other Services       Precautions / Restrictions Precautions Precautions: Posterior Hip Precaution Booklet Issued: Yes (comment) Restrictions Weight Bearing Restrictions: Yes LLE Weight Bearing: Weight bearing as tolerated Other Position/Activity Restrictions: Hip precautions/WB status taken from chart review from recent admission for L THA revision.      Mobility  Bed Mobility Overal bed mobility: Needs Assistance Bed Mobility: Supine to Sit;Sit to Supine     Supine to sit: Supervision Sit to supine: Supervision   General bed mobility comments: SBA for cues for sequencing to ensure L hip precaution compliance  Transfers Overall transfer level: Needs assistance Equipment used: Lofstrands Transfers: Sit to/from Stand Sit to Stand: Min guard;From elevated surface         General transfer comment: Good eccentric and concentric control during transfers from an elevated EOB to ensure L post hip precaution compliance  Ambulation/Gait             General Gait Details: Pt refused attempt at ambulation.  Pt highly anxious and agitated during the session and stated that she would not try to ambulate "until I get my meds".  Stairs            Wheelchair Mobility    Modified Rankin (Stroke Patients Only)       Balance Overall  balance assessment: Needs assistance Sitting-balance support: Feet supported;No upper extremity supported Sitting balance-Leahy Scale: Good Sitting balance - Comments: Pt steady sitting, reaching within BOS. Demonstrates good static and dynamic sitting balance during use of BSC. Able to weight shift to complete peri-care independently   Standing balance support: Single extremity supported Standing balance-Leahy Scale: Fair Standing balance comment: Pt able to maintain static standing balance with one loftstrand crutch without physical assistance                             Pertinent Vitals/Pain Pain Assessment: 0-10 Pain Score: 4  Pain Descriptors / Indicators: Aching Pain Intervention(s): Monitored during session;Limited activity within patient's tolerance    Home Living Family/patient expects to be discharged to:: Private residence Living Arrangements: Alone Available Help at Discharge: Friend(s);Available PRN/intermittently;Personal care attendant Type of Home: Apartment Home Access: Stairs to enter Entrance Stairs-Rails: Right;Left;Can reach both Entrance Stairs-Number of Steps: 3 Home Layout: One level Home Equipment: Bedside commode;Other (comment);Shower seat;Wheelchair - manual Additional Comments: Loftstrand crutches    Prior Function Level of Independence: Needs assistance   Gait / Transfers Assistance Needed: Pt Mod Ind with Amb with loftstrand crutch with 5 falls in the last year mostly secondary to L hip instability, Ind with bed mobility and transfers  ADL's / Homemaking Assistance Needed: Pt has a PCA 2.5 hrs/day, 7 days/wk who assists as needed including with housework, bathing, and dressing  Comments: uses loft stand crutch recently, previously indep     Hand Dominance   Dominant Hand: Right    Extremity/Trunk Assessment   Upper Extremity Assessment Upper Extremity Assessment: Difficult to assess due to impaired cognition    Lower Extremity  Assessment Lower Extremity Assessment: Difficult to assess due to impaired cognition       Communication   Communication: Other (comment)(Communication limited by pt's current cognitive status)  Cognition Arousal/Alertness: Awake/alert Behavior During Therapy: Restless;Agitated;Anxious;Impulsive Overall Cognitive Status: Impaired/Different from baseline Area of Impairment: Following commands;Safety/judgement                       Following Commands: Follows one step commands inconsistently Safety/Judgement: Decreased awareness of safety;Decreased awareness of deficits     General Comments: Pt highly anxious with rapid shallow breathing and full body trembling throughout the session; pt stated that she has been having frequent "panic attacks", nursing present and aware      General Comments      Exercises Other Exercises Other Exercises: Posterior hip precaution education/review with both patient and patient's ED nurse Other Exercises: PLB education and practice with pt to attempt to address patient's high anxiety level   Assessment/Plan    PT Assessment Patient needs continued PT services  PT Problem List Decreased strength;Decreased activity tolerance;Decreased balance;Decreased mobility;Decreased knowledge of precautions       PT Treatment Interventions DME instruction;Gait training;Stair training;Functional mobility training;Therapeutic activities;Therapeutic exercise;Balance training;Patient/family education    PT Goals (Current goals can be found in the Care Plan section)  Acute Rehab PT Goals PT Goal Formulation: Patient unable to participate in goal setting Time For Goal Achievement: 04/11/19 Potential to Achieve Goals: Fair  Frequency 7X/week   Barriers to discharge        Co-evaluation               AM-PAC PT "6 Clicks" Mobility  Outcome Measure Help needed turning from your back to your side while in a flat bed without using bedrails?: A  Little Help needed moving from lying on your back to sitting on the side of a flat bed without using bedrails?: A Little Help needed moving to and from a bed to a chair (including a wheelchair)?: A Little Help needed standing up from a chair using your arms (e.g., wheelchair or bedside chair)?: A Little Help needed to walk in hospital room?: A Lot Help needed climbing 3-5 steps with a railing? : A Lot 6 Click Score: 16    End of Session Equipment Utilized During Treatment: Gait belt Activity Tolerance: Treatment limited secondary to agitation Patient left: in bed;with nursing/sitter in room Nurse Communication: Mobility status;Other (comment)(Posterior hip precaution education provided to nursing) PT Visit Diagnosis: Unsteadiness on feet (R26.81);History of falling (Z91.81);Other abnormalities of gait and mobility (R26.89)    Time: [id number] PT Time Calculation (min) (ACUTE ONLY): 22 min   Charges:   PT Evaluation $PT Eval Moderate Complexity: 1 Mod PT Treatments $Therapeutic Activity: 8-22 mins        D. Scott Geoge Lawrance PT, DPT 03/29/19, 4:00 PM

## 2019-03-29 NOTE — ED Notes (Signed)
Patient is resting comfortably. 

## 2019-03-29 NOTE — ED Provider Notes (Signed)
-----------------------------------------   6:06 AM on 03/29/2019 -----------------------------------------   Blood pressure (!) 148/90, pulse 74, temperature 98.4 F (36.9 C), temperature source Oral, resp. rate 18, height 5\' 4"  (1.626 m), weight 66.7 kg, SpO2 96 %.  The patient is sleeping at this time.  There have been no acute events since the last update.  Behavioral medicine to reassess patient this morning.   Paulette Blanch, MD 03/29/19 [id number]

## 2019-03-29 NOTE — ED Notes (Signed)
Patient was yelling and screaming loud enough to be heard at the nurses station, writer went and asked patient what was wrong, patient continued yelling at nurse and saying "you all are trying to keep me here all fucking day and I dont have any of my contact numbers, non of my emergency contacts no where I am". Then patient begin to get upset because she felt the tv remote did not work, Probation officer stayed in her room and assisted her with the remote

## 2019-03-29 NOTE — ED Notes (Signed)
Refused snack and beverage.

## 2019-03-29 NOTE — ED Notes (Signed)
VOL  PENDING  PLACEMENT 

## 2019-03-29 NOTE — TOC Progression Note (Addendum)
Transition of Care [facility]) - Progression Note    Patient Details  Name: [name] MRN: [id number] Date of Birth: May 24, 1956  [facility] CM/SW Contact  Tania Jamarie Mussa, LCSW Phone Number: 03/29/2019, 4:21 PM  Clinical Narrative:     CSW was notified by PT that patient currently does not meet criteria for SNF.  CSW and ED psychiatrist spoke with EDP concerning patient being discharged home. Patient has capacity, and is her own legal guardian.  Patient currently has home health with [facility].   EDP stated that he will reassess patient to determine whether she is safe for discharge.     Expected Discharge Plan: Skilled Nursing Facility Barriers to Discharge: Ship broker, Continued Medical Work up  Expected Discharge Plan and Services Expected Discharge Plan: [place]   Discharge Planning Services: CM Consult Post Acute Care Choice: Skilled Nursing Facility(patient possibly open to SNF as long as she has someone to take care of her animals)                                         Social Determinants of Health (SDOH) Interventions    Readmission Risk Interventions No flowsheet data found.

## 2019-03-29 NOTE — ED Notes (Signed)
Report to include Situation, Background, Assessment, and Recommendations received from [facility]. Patient alert and oriented, warm and dry, in no acute distress. Patient denies SI, HI, AVH and pain. Patient is anxious and voiced that she is not ready to leave the hospital.  Patient made aware of Q15 minute rounds and Rover and Officer presence for their safety. Patient instructed to come to me with needs or concerns.

## 2019-03-29 NOTE — ED Notes (Signed)
Patient was seen by physical therapy, due to patients recent hip surgery patients bed has to be raised up when she gets in and out of it, so not to dislocate her hip

## 2019-03-30 MED ORDER — BACITRACIN ZINC 500 UNIT/GM EX OINT
TOPICAL_OINTMENT | Freq: Once | CUTANEOUS | Status: DC
  Filled 2019-03-30: qty 0.9

## 2019-03-30 NOTE — ED Notes (Signed)
PT at bedside.

## 2019-03-30 NOTE — ED Provider Notes (Signed)
Sutures were removed from the left hip.  Incision site appears clean dry and intact.  We placed a dressing over the incision site.   Earleen Newport, MD 03/30/19 [phone]

## 2019-03-30 NOTE — ED Notes (Signed)
Hourly rounding reveals patient in room. No complaints, stable, in no acute distress. Q15 minute rounds and monitoring via Rover and Officer to continue.   

## 2019-03-30 NOTE — ED Notes (Signed)
Dr. Merri Brunette staples from left hip, Patient tolerated well, nurse will apply dressing.

## 2019-03-30 NOTE — ED Notes (Signed)
Pt asleep in bed, breakfast placed on pt right side.

## 2019-03-30 NOTE — ED Notes (Signed)
Patient yelling that she wants to get out of here, nurse let her know that as soon as she can get a ride that she can be discharged, will call family member again this am, Patient has breakfast tray, nurse encouraged her to eat and just to be patient, will continue to monitor.

## 2019-03-30 NOTE — ED Notes (Addendum)
Patient voices understanding of discharge instructions, all belongings given back to her, Patient dressed, and Love[facility] Newcomer here to transport home, Patient cursing and calling people names all the way down the hall as she was being wheeled out in w/c. Patient left the hospital in car with [name].

## 2019-03-30 NOTE — Progress Notes (Signed)
Physical Therapy Treatment Patient Details Name: [name] MRN: [id number] DOB: 05/28/1956 Today's Date: 03/30/2019    History of Present Illness Per ER MD note: Pt is a 63 y.o. female with past medical history of Parkinson's disease, anxiety, and bipolar disorder presents to the ED for hallucinations.  History is limited as patient is confused with disorganized speech pattern.  Per EMS, home health came to check on the patient earlier today and found her to be not acting like her usual self.  Patient now very confused and having difficulty answering questions appropriately.  She denies any complaints, denies taking more than her prescribed medications.  She was seen in the ED recently for increased tremulousness and started on Cogentin.  She also recently underwent left hip replacement.    PT Comments    Pt presented with deficits in strength, transfers, mobility, gait, balance, and activity tolerance. Pt presented with grossly improved ability to follow commands and with decreased anxiety compared to previous session, but remains anxious overall. Pt reported 4/10 hip pain at rest, and pain through BLE upon standing but could not quantify the pain. Pt completed mobility with supervision, and as many as 5 sit<>stand with lofstrand. Pt initially required min A for stability while in standing and ended session with close CGA for safety. Pt ambulated around the ED room about 15 ft, which the patient said was about as far as she'd have to go to get to the bathroom at home. Overall pt grossly unsteady and at high risk for falls. Pt will benefit from PT services in a SNF setting upon discharge to safely address above deficits for decreased caregiver assistance and eventual return to PLOF.   Follow Up Recommendations  SNF     Equipment Recommendations  None recommended by PT    Recommendations for Other Services       Precautions / Restrictions Precautions Precautions: Posterior Hip Precaution  Booklet Issued: Yes (comment) Restrictions Weight Bearing Restrictions: Yes LLE Weight Bearing: Weight bearing as tolerated Other Position/Activity Restrictions: Hip precautions/WB status taken from chart review from recent admission for L THA revision.    Mobility  Bed Mobility Overal bed mobility: Needs Assistance Bed Mobility: Supine to Sit;Sit to Supine     Supine to sit: Supervision Sit to supine: Supervision   General bed mobility comments: SBA for cues for sequencing to ensure L hip precaution compliance  Transfers Overall transfer level: Needs assistance Equipment used: Lofstrands Transfers: Sit to/from Stand Sit to Stand: Min guard;From elevated surface         General transfer comment: Good eccentric and concentric control during transfers from an elevated EOB to ensure L post hip precaution compliance  Ambulation/Gait Ambulation/Gait assistance: Min assist Gait Distance (Feet): 15 Feet Assistive device: Lofstrands Gait Pattern/deviations: Step-to pattern;Decreased step length - right;Decreased stance time - left Gait velocity: Decreased   General Gait Details: Pt stood but felt too shaky and refused attempt at ambulation at first. After calming down and completing more bed exercises, pt insisted on walking as far as she would have to reach the bathroom at home.   Stairs             Wheelchair Mobility    Modified Rankin (Stroke Patients Only)       Balance Overall balance assessment: Needs assistance Sitting-balance support: Feet supported;No upper extremity supported Sitting balance-Leahy Scale: Good Sitting balance - Comments: Pt steady sitting, reaching within BOS. Demonstrates good static and dynamic sitting balance during use of BSC.  Able to weight shift to complete peri-care independently   Standing balance support: Single extremity supported Standing balance-Leahy Scale: Fair Standing balance comment: Pt able to maintain static standing  balance with one loftstrand crutch without physical assistance                            Cognition Arousal/Alertness: Awake/alert Behavior During Therapy: Anxious Overall Cognitive Status: Impaired/Different from baseline Area of Impairment: Attention;Following commands                       Following Commands: Follows one step commands inconsistently       General Comments: Pt was anxious about leaving and going home alone, medications, continued care      Exercises Total Joint Exercises Ankle Circles/Pumps: Strengthening;AROM;Both;15 reps;20 reps Quad Sets: Strengthening;Both;10 reps;15 reps Hip ABduction/ADduction: AROM;Both;10 reps;15 reps Straight Leg Raises: AROM;Strengthening;Both;10 reps Long Arc Quad: AROM;Strengthening;Both;10 reps Knee Flexion: AROM;Strengthening;Both;10 reps Marching in Standing: AROM;Standing;Both;10 reps Other Exercises Other Exercises: Posterior hip precaution education/review with pt Other Exercises: PLB education and practice with pt to attempt to address patient's high anxiety level    General Comments        Pertinent Vitals/Pain Pain Score: 4  Pain Location: L hip. BLE upon standing, feeling of weakness Pain Descriptors / Indicators: Aching Pain Intervention(s): Monitored during session;Limited activity within patient's tolerance    Home Living                      Prior Function            PT Goals (current goals can now be found in the care plan section) Acute Rehab PT Goals Time For Goal Achievement: 04/11/19 Potential to Achieve Goals: Fair    Frequency    7X/week      PT Plan Discharge plan needs to be updated    Co-evaluation              AM-PAC PT "6 Clicks" Mobility   Outcome Measure  Help needed turning from your back to your side while in a flat bed without using bedrails?: None Help needed moving from lying on your back to sitting on the side of a flat bed without  using bedrails?: A Little Help needed moving to and from a bed to a chair (including a wheelchair)?: A Little Help needed standing up from a chair using your arms (e.g., wheelchair or bedside chair)?: A Little Help needed to walk in hospital room?: A Little Help needed climbing 3-5 steps with a railing? : A Lot 6 Click Score: 18    End of Session Equipment Utilized During Treatment: Gait belt Activity Tolerance: No increased pain;Treatment limited secondary to agitation Patient left: in bed(Low bed, fully lowered.) Nurse Communication: Mobility status PT Visit Diagnosis: Unsteadiness on feet (R26.81);History of falling (Z91.81);Other abnormalities of gait and mobility (R26.89)     Time: [id number] PT Time Calculation (min) (ACUTE ONLY): 32 min  Charges:              Juanda Crumble "Gus" Jeannette Corpus, SPT  03/30/19, 12:19 PM

## 2019-03-30 NOTE — ED Notes (Signed)
Nurse let Patient know that she would be leaving at 37, [name] her friend states that she will be here to transport her home, Patient states " I'm so glad to be leaving here, she also said she did not remember how she got here and nurse let her know that she was confused when she came in, she became teary eyed and states " I had my hip surgery the last of Sept. I left to soon and was not ready to be at home, I have a home health nurse that helps me every day now, and I have my pastor that will check on me at times. Patient is calm and watching tv , nurse will continue to monitor. q 15 minute checks for safety.

## 2019-03-31 ENCOUNTER — Other Ambulatory Visit: Payer: Self-pay

## 2019-03-31 ENCOUNTER — Emergency Department: Payer: Medicare Other

## 2019-03-31 ENCOUNTER — Encounter: Payer: Self-pay | Admitting: Emergency Medicine

## 2019-03-31 ENCOUNTER — Inpatient Hospital Stay
Admission: EM | Admit: 2019-03-31 | Discharge: 2019-04-09 | DRG: 682 | Disposition: A | Discharge status: Skilled Nursing Facility | Payer: Medicare Other | Attending: Specialist | Admitting: Specialist

## 2019-03-31 DIAGNOSIS — F10231 Alcohol dependence with withdrawal delirium: Secondary | ICD-10-CM | POA: Diagnosis present

## 2019-03-31 DIAGNOSIS — Z825 Family history of asthma and other chronic lower respiratory diseases: Secondary | ICD-10-CM

## 2019-03-31 DIAGNOSIS — I10 Essential (primary) hypertension: Secondary | ICD-10-CM | POA: Diagnosis present

## 2019-03-31 DIAGNOSIS — F4310 Post-traumatic stress disorder, unspecified: Secondary | ICD-10-CM | POA: Diagnosis present

## 2019-03-31 DIAGNOSIS — R4182 Altered mental status, unspecified: Secondary | ICD-10-CM

## 2019-03-31 DIAGNOSIS — N179 Acute kidney failure, unspecified: Principal | ICD-10-CM | POA: Diagnosis present

## 2019-03-31 DIAGNOSIS — Z96643 Presence of artificial hip joint, bilateral: Secondary | ICD-10-CM | POA: Diagnosis present

## 2019-03-31 DIAGNOSIS — G20 Parkinson's disease: Secondary | ICD-10-CM | POA: Diagnosis present

## 2019-03-31 DIAGNOSIS — Z881 Allergy status to other antibiotic agents status: Secondary | ICD-10-CM | POA: Diagnosis not present

## 2019-03-31 DIAGNOSIS — G249 Dystonia, unspecified: Secondary | ICD-10-CM | POA: Diagnosis present

## 2019-03-31 DIAGNOSIS — Z888 Allergy status to other drugs, medicaments and biological substances status: Secondary | ICD-10-CM

## 2019-03-31 DIAGNOSIS — G629 Polyneuropathy, unspecified: Secondary | ICD-10-CM | POA: Diagnosis present

## 2019-03-31 DIAGNOSIS — F17210 Nicotine dependence, cigarettes, uncomplicated: Secondary | ICD-10-CM | POA: Diagnosis present

## 2019-03-31 DIAGNOSIS — B9620 Unspecified Escherichia coli [E. coli] as the cause of diseases classified elsewhere: Secondary | ICD-10-CM | POA: Diagnosis not present

## 2019-03-31 DIAGNOSIS — Z20828 Contact with and (suspected) exposure to other viral communicable diseases: Secondary | ICD-10-CM | POA: Diagnosis present

## 2019-03-31 DIAGNOSIS — G9341 Metabolic encephalopathy: Secondary | ICD-10-CM | POA: Diagnosis present

## 2019-03-31 DIAGNOSIS — E876 Hypokalemia: Secondary | ICD-10-CM | POA: Diagnosis present

## 2019-03-31 DIAGNOSIS — F329 Major depressive disorder, single episode, unspecified: Secondary | ICD-10-CM | POA: Diagnosis present

## 2019-03-31 DIAGNOSIS — F411 Generalized anxiety disorder: Secondary | ICD-10-CM | POA: Diagnosis present

## 2019-03-31 DIAGNOSIS — Z1612 Extended spectrum beta lactamase (ESBL) resistance: Secondary | ICD-10-CM | POA: Diagnosis not present

## 2019-03-31 DIAGNOSIS — E871 Hypo-osmolality and hyponatremia: Secondary | ICD-10-CM | POA: Diagnosis present

## 2019-03-31 DIAGNOSIS — J449 Chronic obstructive pulmonary disease, unspecified: Secondary | ICD-10-CM | POA: Diagnosis present

## 2019-03-31 DIAGNOSIS — E860 Dehydration: Secondary | ICD-10-CM | POA: Diagnosis present

## 2019-03-31 DIAGNOSIS — G4700 Insomnia, unspecified: Secondary | ICD-10-CM | POA: Diagnosis present

## 2019-03-31 DIAGNOSIS — Z885 Allergy status to narcotic agent status: Secondary | ICD-10-CM

## 2019-03-31 DIAGNOSIS — K829 Disease of gallbladder, unspecified: Secondary | ICD-10-CM

## 2019-03-31 DIAGNOSIS — R011 Cardiac murmur, unspecified: Secondary | ICD-10-CM | POA: Diagnosis present

## 2019-03-31 DIAGNOSIS — N390 Urinary tract infection, site not specified: Secondary | ICD-10-CM | POA: Diagnosis not present

## 2019-03-31 DIAGNOSIS — R450 Nervousness: Secondary | ICD-10-CM | POA: Diagnosis not present

## 2019-03-31 LAB — CREATININE, SERUM
Creatinine, Ser: 2.36 mg/dL — ABNORMAL HIGH (ref 0.44–1.00)
GFR calc Af Amer: 24 mL/min — ABNORMAL LOW (ref >60)
GFR calc non Af Amer: 21 mL/min — ABNORMAL LOW (ref >60)

## 2019-03-31 LAB — URINALYSIS, COMPLETE (UACMP) WITH MICROSCOPIC
Bilirubin Urine: NEGATIVE
Glucose, UA: NEGATIVE mg/dL
Hgb urine dipstick: NEGATIVE
Ketones, ur: 5 mg/dL — AB
Leukocytes,Ua: NEGATIVE
Nitrite: NEGATIVE
Protein, ur: 100 mg/dL — AB
Specific Gravity, Urine: 1.016 (ref 1.005–1.030)
pH: 5 (ref 5.0–8.0)

## 2019-03-31 LAB — CBC
HCT: 36.2 % (ref 36.0–46.0)
HCT: 32.9 % — ABNORMAL LOW (ref 36.0–46.0)
Hemoglobin: 12.1 g/dL (ref 12.0–15.0)
Hemoglobin: 11 g/dL — ABNORMAL LOW (ref 12.0–15.0)
MCH: 31.5 pg (ref 26.0–34.0)
MCH: 31.5 pg (ref 26.0–34.0)
MCHC: 33.4 g/dL (ref 30.0–36.0)
MCHC: 33.4 g/dL (ref 30.0–36.0)
MCV: 94.3 fL (ref 80.0–100.0)
MCV: 94.3 fL (ref 80.0–100.0)
Platelets: 437 10*3/uL — ABNORMAL HIGH (ref 150–400)
Platelets: 329 10*3/uL (ref 150–400)
RBC: 3.84 MIL/uL — ABNORMAL LOW (ref 3.87–5.11)
RBC: 3.49 MIL/uL — ABNORMAL LOW (ref 3.87–5.11)
RDW: 14 % (ref 11.5–15.5)
RDW: 14.1 % (ref 11.5–15.5)
WBC: 15.9 10*3/uL — ABNORMAL HIGH (ref 4.0–10.5)
WBC: 12.1 10*3/uL — ABNORMAL HIGH (ref 4.0–10.5)
nRBC: 0 % (ref 0.0–0.2)
nRBC: 0 % (ref 0.0–0.2)

## 2019-03-31 LAB — COMPREHENSIVE METABOLIC PANEL
ALT: <5 U/L (ref 0–44)
ALT: 6 U/L (ref 0–44)
AST: 20 U/L (ref 15–41)
AST: 17 U/L (ref 15–41)
Albumin: 4.3 g/dL (ref 3.5–5.0)
Albumin: 3.5 g/dL (ref 3.5–5.0)
Alkaline Phosphatase: 86 U/L (ref 38–126)
Alkaline Phosphatase: 70 U/L (ref 38–126)
Anion gap: 20 — ABNORMAL HIGH (ref 5–15)
Anion gap: 17 — ABNORMAL HIGH (ref 5–15)
BUN: 63 mg/dL — ABNORMAL HIGH (ref 8–23)
BUN: 58 mg/dL — ABNORMAL HIGH (ref 8–23)
CO2: 16 mmol/L — ABNORMAL LOW (ref 22–32)
CO2: 16 mmol/L — ABNORMAL LOW (ref 22–32)
Calcium: 9.5 mg/dL (ref 8.9–10.3)
Calcium: 8.6 mg/dL — ABNORMAL LOW (ref 8.9–10.3)
Chloride: 98 mmol/L (ref 98–111)
Chloride: 103 mmol/L (ref 98–111)
Creatinine, Ser: 2.86 mg/dL — ABNORMAL HIGH (ref 0.44–1.00)
Creatinine, Ser: 2.34 mg/dL — ABNORMAL HIGH (ref 0.44–1.00)
GFR calc Af Amer: 19 mL/min — ABNORMAL LOW (ref >60)
GFR calc Af Amer: 25 mL/min — ABNORMAL LOW (ref >60)
GFR calc non Af Amer: 17 mL/min — ABNORMAL LOW (ref >60)
GFR calc non Af Amer: 21 mL/min — ABNORMAL LOW (ref >60)
Glucose, Bld: 71 mg/dL (ref 70–99)
Glucose, Bld: 63 mg/dL — ABNORMAL LOW (ref 70–99)
Potassium: 3.4 mmol/L — ABNORMAL LOW (ref 3.5–5.1)
Potassium: 3.1 mmol/L — ABNORMAL LOW (ref 3.5–5.1)
Sodium: 134 mmol/L — ABNORMAL LOW (ref 135–145)
Sodium: 136 mmol/L (ref 135–145)
Total Bilirubin: 2.8 mg/dL — ABNORMAL HIGH (ref 0.3–1.2)
Total Bilirubin: 2.6 mg/dL — ABNORMAL HIGH (ref 0.3–1.2)
Total Protein: 7.9 g/dL (ref 6.5–8.1)
Total Protein: 6.7 g/dL (ref 6.5–8.1)

## 2019-03-31 LAB — PHOSPHORUS: Phosphorus: 4.2 mg/dL (ref 2.5–4.6)

## 2019-03-31 LAB — ETHANOL: Alcohol, Ethyl (B): <10 mg/dL (ref <10)

## 2019-03-31 LAB — URINE DRUG SCREEN, QUALITATIVE (ARMC ONLY)
Amphetamines, Ur Screen: NOT DETECTED
Barbiturates, Ur Screen: NOT DETECTED
Benzodiazepine, Ur Scrn: NOT DETECTED
Cannabinoid 50 Ng, Ur ~~LOC~~: NOT DETECTED
Cocaine Metabolite,Ur ~~LOC~~: NOT DETECTED
MDMA (Ecstasy)Ur Screen: NOT DETECTED
Methadone Scn, Ur: NOT DETECTED
Opiate, Ur Screen: NOT DETECTED
Phencyclidine (PCP) Ur S: NOT DETECTED
Tricyclic, Ur Screen: NOT DETECTED

## 2019-03-31 LAB — ACETAMINOPHEN LEVEL: Acetaminophen (Tylenol), Serum: <10 ug/mL — ABNORMAL LOW (ref 10–30)

## 2019-03-31 LAB — MAGNESIUM: Magnesium: 1.9 mg/dL (ref 1.7–2.4)

## 2019-03-31 LAB — SALICYLATE LEVEL: Salicylate Lvl: <7 mg/dL (ref 2.8–30.0)

## 2019-03-31 MED ORDER — CIPROFLOXACIN IN D5W 400 MG/200ML IV SOLN
400.0000 mg | Freq: Once | INTRAVENOUS | Status: AC
  Administered 2019-03-31: 400 mg via INTRAVENOUS
  Filled 2019-03-31: qty 200

## 2019-03-31 MED ORDER — LORAZEPAM 2 MG/ML IJ SOLN
1.0000 mg | Freq: Once | INTRAMUSCULAR | Status: AC
  Administered 2019-03-31: 1 mg via INTRAVENOUS
  Filled 2019-03-31: qty 1

## 2019-03-31 MED ORDER — PROPRANOLOL HCL 20 MG PO TABS
20.0000 mg | ORAL_TABLET | Freq: Three times a day (TID) | ORAL | Status: DC
  Administered 2019-04-01 – 2019-04-09 (×22): 20 mg via ORAL
  Filled 2019-03-31 (×28): qty 1

## 2019-03-31 MED ORDER — ACETAMINOPHEN 650 MG RE SUPP: 650.0000 mg | Freq: Four times a day (QID) | RECTAL | Status: DC | PRN

## 2019-03-31 MED ORDER — HEPARIN SODIUM (PORCINE) 5000 UNIT/ML IJ SOLN
5000.0000 [IU] | Freq: Three times a day (TID) | INTRAMUSCULAR | Status: DC
  Administered 2019-03-31 – 2019-04-09 (×25): 5000 [IU] via SUBCUTANEOUS
  Filled 2019-03-31 (×28): qty 1

## 2019-03-31 MED ORDER — ONDANSETRON HCL 4 MG PO TABS: 4.0000 mg | ORAL_TABLET | Freq: Four times a day (QID) | ORAL | Status: DC | PRN

## 2019-03-31 MED ORDER — CALCIUM CARBONATE ANTACID 500 MG PO CHEW
500.0000 mg | CHEWABLE_TABLET | Freq: Every day | ORAL | Status: DC
  Administered 2019-04-02 – 2019-04-09 (×8): 500 mg via ORAL
  Filled 2019-03-31 (×9): qty 3

## 2019-03-31 MED ORDER — THIAMINE HCL 100 MG/ML IJ SOLN: 100.0000 mg | Freq: Every day | INTRAMUSCULAR | Status: DC

## 2019-03-31 MED ORDER — AMANTADINE HCL 100 MG PO CAPS
200.0000 mg | ORAL_CAPSULE | Freq: Two times a day (BID) | ORAL | Status: DC
  Administered 2019-04-01 – 2019-04-09 (×16): 200 mg via ORAL
  Filled 2019-03-31 (×19): qty 2

## 2019-03-31 MED ORDER — ACETAMINOPHEN 325 MG PO TABS: 650.0000 mg | ORAL_TABLET | Freq: Four times a day (QID) | ORAL | Status: DC | PRN

## 2019-03-31 MED ORDER — BENZTROPINE MESYLATE 1 MG PO TABS
1.0000 mg | ORAL_TABLET | Freq: Every day | ORAL | Status: DC
  Administered 2019-04-01 – 2019-04-08 (×8): 1 mg via ORAL
  Filled 2019-03-31 (×10): qty 1

## 2019-03-31 MED ORDER — FOLIC ACID 1 MG PO TABS
1.0000 mg | ORAL_TABLET | Freq: Every day | ORAL | Status: DC
  Administered 2019-04-02 – 2019-04-09 (×8): 1 mg via ORAL
  Filled 2019-03-31 (×9): qty 1

## 2019-03-31 MED ORDER — ADULT MULTIVITAMIN W/MINERALS CH
1.0000 | ORAL_TABLET | Freq: Every day | ORAL | Status: DC
  Administered 2019-04-02 – 2019-04-09 (×8): 1 via ORAL
  Filled 2019-03-31 (×9): qty 1

## 2019-03-31 MED ORDER — SODIUM CHLORIDE 0.9 % IV SOLN
INTRAVENOUS | Status: DC
  Administered 2019-03-31 – 2019-04-02 (×3): via INTRAVENOUS

## 2019-03-31 MED ORDER — LORAZEPAM 2 MG/ML IJ SOLN
1.0000 mg | INTRAMUSCULAR | Status: AC | PRN
  Administered 2019-03-31: 1 mg via INTRAVENOUS
  Administered 2019-03-31 – 2019-04-03 (×9): 2 mg via INTRAVENOUS
  Filled 2019-03-31 (×11): qty 1

## 2019-03-31 MED ORDER — VITAMIN B-1 100 MG PO TABS
100.0000 mg | ORAL_TABLET | Freq: Every day | ORAL | Status: DC
  Administered 2019-04-02 – 2019-04-09 (×8): 100 mg via ORAL
  Filled 2019-03-31 (×9): qty 1

## 2019-03-31 MED ORDER — CARBIDOPA-LEVODOPA 25-100 MG PO TABS
3.0000 | ORAL_TABLET | Freq: Three times a day (TID) | ORAL | Status: DC
  Administered 2019-04-01 – 2019-04-09 (×24): 3 via ORAL
  Filled 2019-03-31 (×28): qty 3

## 2019-03-31 MED ORDER — NICOTINE 14 MG/24HR TD PT24
14.0000 mg | MEDICATED_PATCH | Freq: Every day | TRANSDERMAL | Status: DC
  Administered 2019-03-31 – 2019-04-08 (×6): 14 mg via TRANSDERMAL
  Filled 2019-03-31 (×10): qty 1

## 2019-03-31 MED ORDER — POTASSIUM CHLORIDE CRYS ER 20 MEQ PO TBCR
40.0000 meq | EXTENDED_RELEASE_TABLET | Freq: Once | ORAL | Status: DC
  Filled 2019-03-31 (×3): qty 2

## 2019-03-31 MED ORDER — ONDANSETRON HCL 4 MG/2ML IJ SOLN
4.0000 mg | Freq: Four times a day (QID) | INTRAMUSCULAR | Status: DC | PRN
  Filled 2019-03-31: qty 2

## 2019-03-31 MED ORDER — BISACODYL 5 MG PO TBEC
5.0000 mg | DELAYED_RELEASE_TABLET | Freq: Every day | ORAL | Status: DC | PRN
  Administered 2019-04-06: 5 mg via ORAL
  Filled 2019-03-31: qty 1

## 2019-03-31 MED ORDER — OXYCODONE HCL 5 MG PO TABS
5.0000 mg | ORAL_TABLET | ORAL | Status: DC | PRN
  Administered 2019-04-06 (×2): 5 mg via ORAL
  Filled 2019-03-31 (×2): qty 1

## 2019-03-31 MED ORDER — VENLAFAXINE HCL ER 75 MG PO CP24
300.0000 mg | ORAL_CAPSULE | Freq: Every day | ORAL | Status: DC
  Administered 2019-04-02 – 2019-04-09 (×8): 300 mg via ORAL
  Filled 2019-03-31 (×9): qty 4

## 2019-03-31 MED ORDER — SENNOSIDES-DOCUSATE SODIUM 8.6-50 MG PO TABS: 1.0000 | ORAL_TABLET | Freq: Every evening | ORAL | Status: DC | PRN

## 2019-03-31 MED ORDER — TRAZODONE HCL 100 MG PO TABS
300.0000 mg | ORAL_TABLET | Freq: Every evening | ORAL | Status: DC | PRN
  Administered 2019-04-03 – 2019-04-08 (×4): 300 mg via ORAL
  Filled 2019-03-31 (×5): qty 3

## 2019-03-31 MED ORDER — ALBUTEROL SULFATE (2.5 MG/3ML) 0.083% IN NEBU: 2.5000 mg | INHALATION_SOLUTION | RESPIRATORY_TRACT | Status: DC | PRN

## 2019-03-31 MED ORDER — LORAZEPAM 1 MG PO TABS: 1.0000 mg | ORAL_TABLET | ORAL | Status: AC | PRN

## 2019-03-31 MED ORDER — SODIUM CHLORIDE 0.9 % IV SOLN
Freq: Once | INTRAVENOUS | Status: AC
  Administered 2019-03-31: 16:00:00 via INTRAVENOUS

## 2019-03-31 NOTE — ED Notes (Addendum)
Brown slippers, [place], white t shirt, teal underwear, beige bra brown leather purse put into pt belongings bag, labeled and given to quad RN.

## 2019-03-31 NOTE — ED Notes (Signed)
Pt resting on stretcher with lights off to enhance rest. Eyes closed and even respirations. No distress noted. Pt easily awoken with verbal stimuli.

## 2019-03-31 NOTE — ED Notes (Signed)
2 rings given to neighbor to take home, pt aware and approved.

## 2019-03-31 NOTE — ED Notes (Signed)
Neighbor Valente David [phone]. Please call if need any information or with updates. Sandi requesting son to be called with update as well. [name] [phone]

## 2019-03-31 NOTE — ED Notes (Signed)
Back from ct.

## 2019-03-31 NOTE — H&P (Signed)
[facility] [place] NAME: [name]    MR#:  [id number]  DATE OF BIRTH:  [birth year]  DATE OF ADMISSION:  03/31/2019  PRIMARY CARE PHYSICIAN: Dion Body, MD   REQUESTING/REFERRING PHYSICIAN: Dr. Jimmye Norman.  CHIEF COMPLAINT:   Chief Complaint  Patient presents with  . Psychiatric Evaluation   Hallucination, tremors and altered mental status. HISTORY OF PRESENT ILLNESS:  [name]  is a 63 y.o. female with a known history of multiple medical problems as below.  The patient presents the ED with above chief complaints.  The patient was found by neighbors drinking a bottle of oral gel due to tooth pain this morning.  The patient is alert, awake now, only complains of right leg pain.  She is not a good historian.  According to Dr. Jimmye Norman, the patient is s/p a hip replacement on September 23.  She is confused and reported has recent alcohol.  She is found acute renal failure, suspected to have UTI and given Cipro in the ED. Dr. Jimmye Norman request admission. PAST MEDICAL HISTORY:   Past Medical History:  Diagnosis Date  . Anxiety   . Anxiety    panic anxiety disorder  . Arthritis   . Asthma    chronic asthmatic bronchitis  . Bipolar disorder ([place])   . Cancer [facility])    Desmoid tumor left forearm  . Depression   . ETOH abuse   . Heart murmur   . Insomnia   . Neuropathy     PAST SURGICAL HISTORY:   Past Surgical History:  Procedure Laterality Date  . ABDOMINAL HYSTERECTOMY    . ABDOMINAL HYSTERECTOMY  2007  . 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 ARM AMPUTATION AT SHOULDER Left   . CESAREAN SECTION    . [place]   x 2  . COLONOSCOPY    . ESOPHAGOGASTRODUODENOSCOPY (EGD) WITH PROPOFOL N/A 04/27/2018   Procedure: ESOPHAGOGASTRODUODENOSCOPY (EGD) WITH PROPOFOL;  Surgeon: Toledo, Benay Pike, MD;  Location: [facility] ENDOSCOPY;  Service: Gastroenterology;  Laterality: N/A;  . JOINT  REPLACEMENT Right 2012   hip  . LUMBAR LAMINECTOMY/DECOMPRESSION MICRODISCECTOMY Left 04/16/2016   Procedure: LUMBAR LAMINECTOMY/DECOMPRESSION MICRODISCECTOMY 1 LEVEL;  Surgeon: Meade Maw, MD;  Location: [facility] ORS;  Service: Neurosurgery;  Laterality: Left;  Left L4-5 far lateral discectomy, left L4-5 laminoforaminotomy  . TOTAL HIP ARTHROPLASTY Left 09/17/2015   Procedure: TOTAL HIP ARTHROPLASTY;  Surgeon: Dereck Leep, MD;  Location: [facility] ORS;  Service: Orthopedics;  Laterality: Left;  . TOTAL HIP REVISION Left 03/16/2019   Procedure: TOTAL HIP REVISION ARTHROPLASTY WITH CONVERSION TO A CONSTRAINED LINER.;  Surgeon: Dereck Leep, MD;  Location: [facility] ORS;  Service: Orthopedics;  Laterality: Left;  . WISDOM TOOTH EXTRACTION      SOCIAL HISTORY:   Social History   Tobacco Use  . Smoking status: Current Every Day Smoker    Packs/day: 0.25    Types: Cigarettes  . Smokeless tobacco: Never Used  Substance Use Topics  . Alcohol use: Yes    Comment: 2 40oz beers daily    FAMILY HISTORY:   Family History  Problem Relation Age of Onset  . COPD Mother     DRUG ALLERGIES:   Allergies  Allergen Reactions  . Benadryl [Diphenhydramine] Hives and Other (See Comments)    Reaction:  Hyperactivity   . Benadryl [Diphenhydramine] Other (See Comments)    My jaw locked and  I could not speak, like a stroke.  . Cephalosporins Hives  . Codeine Nausea And Vomiting    REVIEW OF SYSTEMS:   Review of Systems  Constitutional: Negative for chills, fever and malaise/fatigue.  HENT: Negative for sore throat.   Eyes: Negative for blurred vision and double vision.  Respiratory: Negative for cough, hemoptysis, shortness of breath, wheezing and stridor.   Cardiovascular: Negative for chest pain, palpitations, orthopnea and leg swelling.  Gastrointestinal: Negative for abdominal pain, blood in stool, diarrhea, melena, nausea and vomiting.  Genitourinary: Negative for dysuria, flank pain and  hematuria.  Musculoskeletal: Negative for back pain and joint pain.       Leg pain.  Neurological: Positive for tremors. Negative for dizziness, sensory change, focal weakness, seizures, loss of consciousness, weakness and headaches.  Endo/Heme/Allergies: Negative for polydipsia.  Psychiatric/Behavioral: Negative for depression. The patient is not nervous/anxious.     MEDICATIONS AT HOME:   Prior to Admission medications   Medication Sig Start Date End Date Taking? Authorizing Provider  acetaminophen (TYLENOL) 500 MG tablet Take 1,000 mg by mouth every 6 (six) hours as needed for mild pain or fever.    Yes [provider]  Amantadine HCl 100 MG tablet Take 200 mg by mouth 2 (two) times daily.  01/05/19  Yes [provider]  calcium carbonate (OS-CAL) 600 MG TABS tablet Take 600 mg by mouth daily with breakfast.   Yes [provider]  carbidopa-levodopa (SINEMET IR) 25-100 MG tablet Take 3 tablets by mouth 3 (three) times daily.    Yes [provider]  celecoxib (CELEBREX) 200 MG capsule Take 1 capsule (200 mg total) by mouth 2 (two) times daily. 03/17/19  Yes Reche Dixon, PA-C  cyclobenzaprine (FLEXERIL) 5 MG tablet Take 5 mg by mouth 3 (three) times daily as needed for muscle spasms.   Yes [provider]  enoxaparin (LOVENOX) 40 MG/0.4ML injection Inject 0.4 mLs (40 mg total) into the skin daily for 14 doses. 03/17/19 03/31/19 Yes Reche Dixon, PA-C  gabapentin (NEURONTIN) 300 MG capsule Take 3 capsules (900 mg total) by mouth 3 (three) times daily. 05/21/17  Yes Pucilowska, [name], MD  hydrochlorothiazide (HYDRODIURIL) 25 MG tablet Take 25 mg by mouth daily. 12/30/18  Yes [provider]  oxyCODONE (OXY IR/ROXICODONE) 5 MG immediate release tablet Take 1 tablet (5 mg total) by mouth every 4 (four) hours as needed for moderate pain (pain score 4-6). 03/17/19  Yes Reche Dixon, PA-C  propranolol (INDERAL) 10 MG tablet Take 1 tablet (10 mg total)  by mouth 3 (three) times daily. Patient taking differently: Take 20 mg by mouth 3 (three) times daily.  05/21/17  Yes Pucilowska, [name], MD  traMADol (ULTRAM) 50 MG tablet Take 1-2 tablets (50-100 mg total) by mouth every 4 (four) hours as needed for moderate pain. 03/17/19  Yes Reche Dixon, PA-C  traZODone (DESYREL) 150 MG tablet Take 300 mg by mouth at bedtime as needed for sleep.    Yes [provider]  venlafaxine XR (EFFEXOR-XR) 150 MG 24 hr capsule Take 2 capsules (300 mg total) by mouth daily with breakfast. 11/29/18  Yes Fritzi Mandes, MD  benztropine (COGENTIN) 1 MG tablet Take 1 tablet (1 mg total) by mouth at bedtime for 7 days. 03/26/19 04/02/19  Gregor Hams, MD  clonazePAM (KLONOPIN) 0.5 MG tablet Take 1 tablet (0.5 mg total) by mouth 2 (two) times daily as needed for anxiety. Patient not taking: Reported on 03/29/2019 12/31/18   Joni Fears,  Doren Custard, MD      VITAL SIGNS:  Blood pressure 96/71, pulse 96, temperature 99 F (37.2 C), temperature source Oral, resp. rate 20, SpO2 96 %.  PHYSICAL EXAMINATION:  Physical Exam  GENERAL:  63 y.o.-year-old patient lying in the bed with no acute distress.  EYES: Pupils equal, round, reactive to light and accommodation. No scleral icterus. Extraocular muscles intact.  HEENT: Head atraumatic, normocephalic.  NECK:  Supple, no jugular venous distention. No thyroid enlargement, no tenderness.  LUNGS: Normal breath sounds bilaterally, no wheezing, rales,rhonchi or crepitation. No use of accessory muscles of respiration.  CARDIOVASCULAR: S1, S2 normal. No murmurs, rubs, or gallops.  ABDOMEN: Soft, nontender, nondistended. Bowel sounds present. No organomegaly or mass.  EXTREMITIES: No pedal edema, cyanosis, or clubbing.  Left arm status amputation. NEUROLOGIC: Cranial nerves II through XII are intact. Muscle strength 5/5 in all extremities. Sensation intact. Gait not checked.  Has tremors. PSYCHIATRIC: The patient is alert and oriented  x 3.  SKIN: No obvious rash, lesion, or ulcer.   LABORATORY PANEL:   CBC Recent Labs  Lab 03/31/19 1446  WBC 15.9*  HGB 12.1  HCT 36.2  PLT 437*   ------------------------------------------------------------------------------------------------------------------  Chemistries  Recent Labs  Lab 03/31/19 1446  NA 134*  K 3.4*  CL 98  CO2 16*  GLUCOSE 71  BUN 63*  CREATININE 2.86*  CALCIUM 9.5  AST 20  ALT <5  ALKPHOS 86  BILITOT 2.8*   ------------------------------------------------------------------------------------------------------------------  Cardiac Enzymes No results for input(s): TROPONINI in the last 168 hours. ------------------------------------------------------------------------------------------------------------------  RADIOLOGY:  Ct Head Wo Contrast  Result Date: 03/31/2019 CLINICAL DATA:  Altered level of consciousness. EXAM: CT HEAD WITHOUT CONTRAST TECHNIQUE: Contiguous axial images were obtained from the base of the skull through the vertex without intravenous contrast. COMPARISON:  Head CT 03/28/2019 FINDINGS: Brain: The ventricles are normal in size and configuration. No extra-axial fluid collections are identified. The gray-white differentiation is maintained. No CT findings for acute hemispheric infarction or intracranial hemorrhage. No mass lesions. The brainstem and cerebellum are normal. Vascular: No hyperdense vessels or obvious aneurysm. Skull: No acute skull fracture.  No bone lesion. Sinuses/Orbits: The paranasal sinuses and mastoid air cells are clear. The globes are intact. Other: No scalp lesions, laceration or hematoma. IMPRESSION: Normal head CT. Electronically Signed   By: Marijo Sanes M.D.   On: 03/31/2019 15:55      IMPRESSION AND PLAN:   Acute renal failure. The patient will be admitted to medical floor. IV fluid support and follow-up BMP.  Hold HCTZ.  Acute metabolic encephalopathy, possible due to alcohol withdrawal or UTI.  Aspiration fall precaution.  Hold Flexeril and gabapentin.  Hypokalemia.  Potassium supplement. Hyponatremia.  Normal saline IV and follow-up BMP.  Alcohol abuse.  Start CIWA protocol.  Suspected UTI.  Cipro is given in the ED.  UA is not impressive for UTI. Leukocytosis.  Follow-up CBC. Elevated bilirubin, unclear etiology, follow-up bilirubin level.  Abdominal ultrasound PRN if no improvement. Tobacco abuse.  Smoking cessation was counseled for 3 to 4 minutes, nicotine patch.  All the records are reviewed and case discussed with ED provider. Management plans discussed with the patient, family and they are in agreement.  CODE STATUS: Full code.  TOTAL TIME TAKING CARE OF THIS PATIENT: 55 minutes.    Demetrios Loll M.D on 03/31/2019 at 4:37 PM  Between 7am to 6pm - Pager - [phone]  After 6pm go to [email or web address] - [facility]  Office  [phone]  CC: Primary care physician; Dion Body, MD   Note: This dictation was prepared with Dragon dictation along with smaller phrase technology. Any transcriptional errors that result from this process are unin

## 2019-03-31 NOTE — ED Triage Notes (Signed)
Pt here with c/o hallucinations and AMS, Pt presents with tremors, was seen by neurology this am-Dr Trena Platt PA sending pt here with concerns that pt is unsafe to go back home by herself, neighbor takes her to appointments and is bringing her here today, requesting we get in touch with pts son in [place] who needs to know how bad off she is. Pt was found by neighbor sucking on a bottle of oragel due to tooth pain this am. Unknown if pt took any meds this am or last pm, neighbor arrived at her house this am to give her Lovenox shot, pt is s/p hip replacement 03/16/19, has received lovenox shot this am. Pt confused to place and time.

## 2019-03-31 NOTE — ED Provider Notes (Addendum)
[facility] Emergency Department Provider Note       Time seen: ----------------------------------------- 3:00 PM on 03/31/2019 ----------------------------------------- Level V caveat: History/ROS limited by altered mental status  I have reviewed the triage vital signs and the nursing notes.  HISTORY   Chief Complaint Psychiatric Evaluation    HPI [name] is a 63 y.o. female with a history of anxiety, arthritis, asthma, bipolar disorder, depression, alcohol abuse, insomnia, neuropathy who presents to the ED for hallucinations and altered mental status.  Patient presents with tremors, was seen by neurology this morning.  Patient was found by the neighbor drinking a bottle of oral gel due to tooth pain this morning.  She is status post hip replacement on September 23.  She is confused, reportedly has had recent alcohol.  Past Medical History:  Diagnosis Date  . Anxiety   . Anxiety    panic anxiety disorder  . Arthritis   . Asthma    chronic asthmatic bronchitis  . Bipolar disorder ([place])   . Cancer [facility])    Desmoid tumor left forearm  . Depression   . ETOH abuse   . Heart murmur   . Insomnia   . Neuropathy     Patient Active Problem List   Diagnosis Date Noted  . Back pain 03/16/2019  . Constipation 03/16/2019  . Parkinson disease ([place]) 03/16/2019  . Murmur 03/16/2019  . Neck pain 03/16/2019  . 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 Essential hypertension 02/22/2019  . At high risk for falls 01/25/2019  . Drug-induced tremor 01/25/2019  . Essential tremor 01/25/2019  . 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 Displacement of internal left hip prosthesis ([place]) 11/29/2018  . Hip dislocation, left ([place]) 11/29/2018  . Hip dislocation, left, sequela 08/06/2018  . High anion gap metabolic acidosis 99991111  . History of alcohol abuse 10/09/2016  . Neuropathy of left upper  extremity 10/09/2016  . Urinary incontinence, mixed 10/09/2016  . Overdose of antidepressant 07/03/2016  . 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 Bipolar I disorder, most recent episode mixed, severe with psychotic features ([place]) 07/03/2015  . Alcohol use disorder, severe, dependence ([place]) 01/26/2015  . Tobacco use disorder 01/26/2015  . Nicotine dependence, uncomplicated [id number]  . Alcohol withdrawal ([place]) 01/08/2015  . 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 Suicidal ideation 12/19/2014  . Alcohol use 10/30/2014  . Anxiety 10/30/2014  . Depression 10/30/2014  . GAD (generalized anxiety disorder) 12/24/2013  . PTSD (post-traumatic stress disorder) 12/22/2013    Past Surgical History:  Procedure Laterality Date  . ABDOMINAL HYSTERECTOMY    . ABDOMINAL HYSTERECTOMY  2007  . 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 ARM AMPUTATION AT SHOULDER Left   . CESAREAN SECTION    . [place]   x 2  . COLONOSCOPY    . ESOPHAGOGASTRODUODENOSCOPY (EGD) WITH PROPOFOL N/A 04/27/2018   Procedure: ESOPHAGOGASTRODUODENOSCOPY (EGD) WITH PROPOFOL;  Surgeon: Toledo, Benay Pike, MD;  Location: [facility] ENDOSCOPY;  Service: Gastroenterology;  Laterality: N/A;  . JOINT REPLACEMENT Right 2012   hip  . LUMBAR LAMINECTOMY/DECOMPRESSION MICRODISCECTOMY Left 04/16/2016   Procedure: LUMBAR LAMINECTOMY/DECOMPRESSION MICRODISCECTOMY 1 LEVEL;  Surgeon: Meade Maw, MD;  Location: [facility] ORS;  Service: Neurosurgery;  Laterality: Left;  Left L4-5 far lateral discectomy, left L4-5 laminoforaminotomy  . TOTAL HIP ARTHROPLASTY Left 09/17/2015  Procedure: TOTAL HIP ARTHROPLASTY;  Surgeon: Dereck Leep, MD;  Location: [facility] ORS;  Service: Orthopedics;  Laterality: Left;  . TOTAL HIP REVISION Left 03/16/2019   Procedure: TOTAL HIP REVISION ARTHROPLASTY WITH CONVERSION TO A CONSTRAINED LINER.;  Surgeon: Dereck Leep, MD;  Location:  [facility] ORS;  Service: Orthopedics;  Laterality: Left;  . WISDOM TOOTH EXTRACTION      Allergies Benadryl [diphenhydramine], Benadryl [diphenhydramine], Cephalosporins, and Codeine  Social History Social History   Tobacco Use  . Smoking status: Current Every Day Smoker    Packs/day: 0.25    Types: Cigarettes  . Smokeless tobacco: Never Used  Substance Use Topics  . Alcohol use: Yes    Comment: 2 40oz beers daily  . Drug use: No    Comment: Patient denies     Review of Systems Unknown, positive for altered mental status  All systems negative/normal/unremarkable except as stated in the HPI  ____________________________________________   PHYSICAL EXAM:  VITAL SIGNS: ED Triage Vitals  Enc Vitals Group     BP 03/31/19 1413 96/71     Pulse Rate 03/31/19 1413 (!) 105     Resp 03/31/19 1413 18     Temp 03/31/19 1413 99.1 F (37.3 C)     Temp Source 03/31/19 1413 Oral     SpO2 03/31/19 1413 95 %     Weight --      Height --      Head Circumference --      Peak Flow --      Pain Score 03/31/19 1431 0     Pain Loc --      Pain Edu? --      Excl. in [place]? --     Constitutional: Alert but disoriented.  Patient appears anxious Eyes: Conjunctivae are normal. Normal extraocular movements. ENT      Head: Normocephalic and atraumatic.      Nose: No congestion/rhinnorhea.      Mouth/Throat: Mucous membranes are moist.      Neck: No stridor. Cardiovascular: Normal rate, regular rhythm. No murmurs, rubs, or gallops. Respiratory: Normal respiratory effort without tachypnea nor retractions. Breath sounds are clear and equal bilaterally. No wheezes/rales/rhonchi. Gastrointestinal: Soft and nontender. Normal bowel sounds Musculoskeletal: Nontender with normal range of motion in extremities. No lower extremity tenderness nor edema. Neurologic:  No gross focal neurologic deficits are appreciated.  Tremulous Skin:  Skin is warm, dry and intact. No rash noted. Psychiatric:  Anxious  ____________________________________________  ED COURSE:  As part of my medical decision making, I reviewed the following data within the [place] History obtained from family if available, nursing notes, old chart and ekg, as well as notes from prior ED visits. Patient presented for altered mental status and hallucinations, we will assess with labs and imaging as indicated at this time.   Procedures  LOUVENA FESS was evaluated in Emergency Department on 03/31/2019 for the symptoms described in the history of present illness. She was evaluated in the context of the global COVID-19 pandemic, which necessitated consideration that the patient might be at risk for infection with the SARS-CoV-2 virus that causes COVID-19. Institutional protocols and algorithms that pertain to the evaluation of patients at risk for COVID-19 are in a state of rapid change based on information released by regulatory bodies including the CDC and federal and state organizations. These policies and algorithms were followed during the patient's care in the ED.  ____________________________________________   LABS (pertinent positives/negatives)  Labs Reviewed  COMPREHENSIVE METABOLIC PANEL - Abnormal; Notable for the following components:      Result Value   Sodium 134 (*)    Potassium 3.4 (*)    CO2 16 (*)    BUN 63 (*)    Creatinine, Ser 2.86 (*)    Total Bilirubin 2.8 (*)    GFR calc non Af Amer 17 (*)    GFR calc Af Amer 19 (*)    Anion gap 20 (*)    All other components within normal limits  CBC - Abnormal; Notable for the following components:   WBC 15.9 (*)    RBC 3.84 (*)    Platelets 437 (*)    All other components within normal limits  URINALYSIS, COMPLETE (UACMP) WITH MICROSCOPIC - Abnormal; Notable for the following components:   Color, Urine AMBER (*)    APPearance CLOUDY (*)    Ketones, ur 5 (*)    Protein, ur 100 (*)    Bacteria, UA MANY (*)    All other  components within normal limits  CULTURE, BLOOD (ROUTINE X 2)  CULTURE, BLOOD (ROUTINE X 2)  URINE CULTURE  ETHANOL  URINE DRUG SCREEN, QUALITATIVE ([facility] ONLY)  SALICYLATE LEVEL  ACETAMINOPHEN LEVEL    RADIOLOGY Images were viewed by me  CT head IMPRESSION:  Normal head CT.  ____________________________________________   DIFFERENTIAL DIAGNOSIS   DTs, dehydration, electrolyte normality, occult infection, sepsis, withdrawal, intoxication  FINAL ASSESSMENT AND PLAN  Altered mental status, acute kidney injury, leukocytosis, possible UTI   Plan: The patient had presented for altered mental status and confusion with an inability to care for self. Patient's labs did reveal multiple abnormalities including acute kidney injury likely from dehydration, leukocytosis suggesting either stress or infection.  Her urine is borderline for infection. Patient's imaging not reveal any acute process.  We have started on IV fluids and I have sent cultures.  I will order Cipro for possible UTI due to cephalosporin allergy.  She will need to be admitted to the hospital and then placed by social work after admission.   Laurence Aly, MD    Note: This note was generated in part or whole with voice recognition software. Voice recognition is usually quite accurate but there are transcription errors that can and very often do occur. I apologize for any typographical errors that were not detected and corrected.     Earleen Newport, MD 03/31/19 1611    Earleen Newport, MD 03/31/19 [phone]

## 2019-03-31 NOTE — ED Notes (Signed)
goin g to ct

## 2019-03-31 NOTE — ED Notes (Signed)
Pt continues shaking and noted tremors, Pt very restless. This RN spoke with MD Jimmye Norman about meds. See [facility]

## 2019-03-31 NOTE — ED Notes (Signed)
ED TO INPATIENT HANDOFF REPORT  ED Nurse Name and Phone #: Seth Bake [phone]   S Name/Age/Gender [name] 63 y.o. female Room/Bed: ED20A/ED20AA  Code Status   Code Status: Prior  Home/SNF/Other Home Patient oriented to: self, place, time and situation Is this baseline? Yes   Triage Complete: Triage complete  Chief Complaint confusion  Triage Note Pt here with c/o hallucinations and AMS, Pt presents with tremors, was seen by neurology this am-Dr Trena Platt PA sending pt here with concerns that pt is unsafe to go back home by herself, neighbor takes her to appointments and is bringing her here today, requesting we get in touch with pts son in [place] who needs to know how bad off she is. Pt was found by neighbor sucking on a bottle of oragel due to tooth pain this am. Unknown if pt took any meds this am or last pm, neighbor arrived at her house this am to give her Lovenox shot, pt is s/p hip replacement 03/16/19, has received lovenox shot this am. Pt confused to place and time.    Allergies Allergies  Allergen Reactions  . Benadryl [Diphenhydramine] Hives and Other (See Comments)    Reaction:  Hyperactivity   . Benadryl [Diphenhydramine] Other (See Comments)    My jaw locked and I could not speak, like a stroke.  . Cephalosporins Hives  . Codeine Nausea And Vomiting    Level of Care/Admitting Diagnosis ED Disposition    ED Disposition Condition [place]: [place] [100120]  Level of Care: Med-Surg [16]  Covid Evaluation: Asymptomatic Screening Protocol (No Symptoms)  Diagnosis: ARF (acute renal failure) [facility]) [id number]  Admitting Physician: Demetrios Loll [phone]  Attending Physician: Demetrios Loll [phone]  Estimated length of stay: past midnight tomorrow  Certification:: I certify this patient will need inpatient services for at least 2 midnights  PT Class (Do Not Modify): Inpatient [101]  PT Acc Code (Do Not Modify): Private  [1]       B Medical/Surgery History Past Medical History:  Diagnosis Date  . Anxiety   . Anxiety    panic anxiety disorder  . Arthritis   . Asthma    chronic asthmatic bronchitis  . Bipolar disorder ([place])   . Cancer [facility])    Desmoid tumor left forearm  . Depression   . ETOH abuse   . Heart murmur   . Insomnia   . Neuropathy    Past Surgical History:  Procedure Laterality Date  . ABDOMINAL HYSTERECTOMY    . ABDOMINAL HYSTERECTOMY  2007  . 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 ARM AMPUTATION AT SHOULDER Left   . CESAREAN SECTION    . [place]   x 2  . COLONOSCOPY    . ESOPHAGOGASTRODUODENOSCOPY (EGD) WITH PROPOFOL N/A 04/27/2018   Procedure: ESOPHAGOGASTRODUODENOSCOPY (EGD) WITH PROPOFOL;  Surgeon: Toledo, Benay Pike, MD;  Location: [facility] ENDOSCOPY;  Service: Gastroenterology;  Laterality: N/A;  . JOINT REPLACEMENT Right 2012   hip  . LUMBAR LAMINECTOMY/DECOMPRESSION MICRODISCECTOMY Left 04/16/2016   Procedure: LUMBAR LAMINECTOMY/DECOMPRESSION MICRODISCECTOMY 1 LEVEL;  Surgeon: Meade Maw, MD;  Location: [facility] ORS;  Service: Neurosurgery;  Laterality: Left;  Left L4-5 far lateral discectomy, left L4-5 laminoforaminotomy  . TOTAL HIP ARTHROPLASTY Left 09/17/2015   Procedure: TOTAL HIP ARTHROPLASTY;  Surgeon: Dereck Leep, MD;  Location: [facility] ORS;  Service: Orthopedics;  Laterality: Left;  . TOTAL HIP REVISION  Left 03/16/2019   Procedure: TOTAL HIP REVISION ARTHROPLASTY WITH CONVERSION TO A CONSTRAINED LINER.;  Surgeon: Dereck Leep, MD;  Location: [facility] ORS;  Service: Orthopedics;  Laterality: Left;  . WISDOM TOOTH EXTRACTION       A IV Location/Drains/Wounds Patient Lines/Drains/Airways Status   Active Line/Drains/Airways    Name:   Placement date:   Placement time:   Site:   Days:   Peripheral IV 03/31/19 Right Antecubital   03/31/19    1556    Antecubital   less than 1   Incision (Closed) 03/16/19 Hip Left    03/16/19    0935     15   Incision (Closed) 03/16/19 Hip Left   03/16/19    0935     15          Intake/Output Last 24 hours No intake or output data in the 24 hours ending 03/31/19 1935  Labs/Imaging Results for orders placed or performed during the hospital encounter of 03/31/19 (from the past 48 hour(s))  Comprehensive metabolic panel     Status: Abnormal   Collection Time: 03/31/19  2:46 PM  Result Value Ref Range   Sodium 134 (L) 135 - 145 mmol/L   Potassium 3.4 (L) 3.5 - 5.1 mmol/L   Chloride 98 98 - 111 mmol/L   CO2 16 (L) 22 - 32 mmol/L   Glucose, Bld 71 70 - 99 mg/dL   BUN 63 (H) 8 - 23 mg/dL   Creatinine, Ser 2.86 (H) 0.44 - 1.00 mg/dL   Calcium 9.5 8.9 - 10.3 mg/dL   Total Protein 7.9 6.5 - 8.1 g/dL   Albumin 4.3 3.5 - 5.0 g/dL   AST 20 15 - 41 U/L   ALT <5 0 - 44 U/L   Alkaline Phosphatase 86 38 - 126 U/L   Total Bilirubin 2.8 (H) 0.3 - 1.2 mg/dL   GFR calc non Af Amer 17 (L) >60 mL/min   GFR calc Af Amer 19 (L) >60 mL/min   Anion gap 20 (H) 5 - 15    Comment: Performed at [name], [address] [place], [place] [address]  Ethanol     Status: None   Collection Time: 03/31/19  2:46 PM  Result Value Ref Range   Alcohol, Ethyl (B) <10 <10 mg/dL    Comment: (NOTE) Lowest detectable limit for serum alcohol is 10 mg/dL. For medical purposes only. Performed at [facility], [address] [place], [place] [id number]   Salicylate level     Status: None   Collection Time: 03/31/19  2:46 PM  Result Value Ref Range   Salicylate Lvl [id number] 2.8 - 30.0 mg/dL    Comment: Performed at [facility], [address] [place], [place] [address]  Acetaminophen level     Status: Abnormal   Collection Time: 03/31/19  2:46 PM  Result Value Ref Range   Acetaminophen (Tylenol), Serum <10 (L) 10 - 30 ug/mL    Comment: (NOTE) Therapeutic concentrations vary significantly. A range of 10-30 ug/mL  may be an effective concentration for many  patients. However, some  are best treated at concentrations outside of this range. Acetaminophen concentrations >150 ug/mL at 4 hours after ingestion  and >50 ug/mL at 12 hours after ingestion are often associated with  toxic reactions. Performed at [facility], [address], [place], [place] [address]   cbc     Status: Abnormal   Collection Time: 03/31/19  2:46 PM  Result  Value Ref Range   WBC 15.9 (H) 4.0 - 10.5 K/uL   RBC 3.84 (L) 3.87 - 5.11 MIL/uL   Hemoglobin 12.1 12.0 - 15.0 g/dL   HCT 36.2 36.0 - 46.0 %   MCV 94.3 80.0 - 100.0 fL   MCH 31.5 26.0 - 34.0 pg   MCHC 33.4 30.0 - 36.0 g/dL   RDW 14.0 11.5 - 15.5 %   Platelets 437 (H) 150 - 400 K/uL   nRBC 0.0 0.0 - 0.2 %    Comment: Performed at [facility], [address], [place], [place] [address]  Urine Drug Screen, Qualitative     Status: None   Collection Time: 03/31/19  2:46 PM  Result Value Ref Range   Tricyclic, Ur Screen NONE DETECTED NONE DETECTED   Amphetamines, Ur Screen NONE DETECTED NONE DETECTED   MDMA (Ecstasy)Ur Screen NONE DETECTED NONE DETECTED   Cocaine Metabolite,Ur [place] NONE DETECTED NONE DETECTED   Opiate, Ur Screen NONE DETECTED NONE DETECTED   Phencyclidine (PCP) Ur S NONE DETECTED NONE DETECTED   Cannabinoid 50 Ng, Ur [place] NONE DETECTED NONE DETECTED   Barbiturates, Ur Screen NONE DETECTED NONE DETECTED   Benzodiazepine, Ur Scrn NONE DETECTED NONE DETECTED   Methadone Scn, Ur NONE DETECTED NONE DETECTED    Comment: (NOTE) Tricyclics + metabolites, urine    Cutoff 1000 ng/mL Amphetamines + metabolites, urine  Cutoff 1000 ng/mL MDMA (Ecstasy), urine              Cutoff 500 ng/mL Cocaine Metabolite, urine          Cutoff 300 ng/mL Opiate + metabolites, urine        Cutoff 300 ng/mL Phencyclidine (PCP), urine         Cutoff 25 ng/mL Cannabinoid, urine                 Cutoff 50 ng/mL Barbiturates + metabolites, urine  Cutoff 200 ng/mL Benzodiazepine, urine              Cutoff 200  ng/mL Methadone, urine                   Cutoff 300 ng/mL The urine drug screen provides only a preliminary, unconfirmed analytical test result and should not be used for non-medical purposes. Clinical consideration and professional judgment should be applied to any positive drug screen result due to possible interfering substances. A more specific alternate chemical method must be used in order to obtain a confirmed analytical result. Gas chromatography / mass spectrometry (GC/MS) is the preferred confirmat ory method. Performed at [name], [address] [place], [place] [address]   Urinalysis, Complete w Microscopic     Status: Abnormal   Collection Time: 03/31/19  2:46 PM  Result Value Ref Range   Color, Urine AMBER (A) YELLOW    Comment: BIOCHEMICALS MAY BE AFFECTED BY COLOR   APPearance CLOUDY (A) CLEAR   Specific Gravity, Urine 1.016 1.005 - 1.030   pH 5.0 5.0 - 8.0   Glucose, UA NEGATIVE NEGATIVE mg/dL   Hgb urine dipstick NEGATIVE NEGATIVE   Bilirubin Urine NEGATIVE NEGATIVE   Ketones, ur 5 (A) NEGATIVE mg/dL   Protein, ur 100 (A) NEGATIVE mg/dL   Nitrite NEGATIVE NEGATIVE   Leukocytes,Ua NEGATIVE NEGATIVE   Squamous Epithelial / LPF 6-10 0 - 5   WBC, UA 6-10 0 - 5 WBC/hpf   RBC / HPF 0-5 0 - 5 RBC/hpf   Bacteria, UA MANY (A)  NONE SEEN   Mucus PRESENT    Hyaline Casts, UA PRESENT     Comment: Performed at [facility], [address] [place], [place] [address]   Ct Head Wo Contrast  Result Date: 03/31/2019 CLINICAL DATA:  Altered level of consciousness. EXAM: CT HEAD WITHOUT CONTRAST TECHNIQUE: Contiguous axial images were obtained from the base of the skull through the vertex without intravenous contrast. COMPARISON:  Head CT 03/28/2019 FINDINGS: Brain: The ventricles are normal in size and configuration. No extra-axial fluid collections are identified. The gray-white differentiation is maintained. No CT findings for acute hemispheric  infarction or intracranial hemorrhage. No mass lesions. The brainstem and cerebellum are normal. Vascular: No hyperdense vessels or obvious aneurysm. Skull: No acute skull fracture.  No bone lesion. Sinuses/Orbits: The paranasal sinuses and mastoid air cells are clear. The globes are intact. Other: No scalp lesions, laceration or hematoma. IMPRESSION: Normal head CT. Electronically Signed   By: Marijo Sanes M.D.   On: 03/31/2019 15:55    Pending Labs Unresulted Labs (From admission, onward)    Start     Ordered   03/31/19 1651  Comprehensive metabolic panel  Once,   STAT     03/31/19 1650   03/31/19 1651  Magnesium  Once,   STAT     03/31/19 1650   03/31/19 1651  Phosphorus  Once,   STAT     03/31/19 1650   03/31/19 1651  CBC  Once,   STAT     03/31/19 1650   03/31/19 1549  Urine Culture  Add-on,   AD    Question:  Patient immune status  Answer:  Normal   03/31/19 1548   03/31/19 1518  Blood culture (routine x 2)  BLOOD CULTURE X 2,   STAT     03/31/19 1517   Signed and Held  Creatinine, serum  (heparin)  Once,   R    Comments: Baseline for heparin therapy IF NOT ALREADY DRAWN.    Signed and Held   Signed and Held  Basic metabolic panel  Tomorrow morning,   R     Signed and Held   Signed and Held  CBC  Tomorrow morning,   R     Signed and Held   Signed and Held  Bilirubin, total  Tomorrow morning,   R     Signed and Held          Vitals/Pain Today's Vitals   03/31/19 1609 03/31/19 1627 03/31/19 1654 03/31/19 1716  BP:      Pulse:  96 95   Resp:  20    Temp:  99 F (37.2 C)    TempSrc:  Oral    SpO2:  96%    PainSc: 0-No pain   0-No pain    Isolation Precautions No active isolations  Medications Medications  LORazepam (ATIVAN) tablet 1-4 mg ( Oral See Alternative 03/31/19 1659)    Or  LORazepam (ATIVAN) injection 1-4 mg (1 mg Intravenous Given 03/31/19 1659)  thiamine (VITAMIN B-1) tablet 100 mg (has no administration in time range)    Or  thiamine (B-1)  injection 100 mg (has no administration in time range)  folic acid (FOLVITE) tablet 1 mg (has no administration in time range)  multivitamin with minerals tablet 1 tablet (has no administration in time range)  0.9 %  sodium chloride infusion ( Intravenous New Bag/Given 03/31/19 1602)  ciprofloxacin (CIPRO) IVPB 400 mg (0 mg Intravenous Stopped 03/31/19 1831)  LORazepam (ATIVAN)  injection 1 mg (1 mg Intravenous Given 03/31/19 1759)    Mobility walks with device High fall risk   Focused Assessments Cardiac Assessment Handoff:    Lab Results  Component Value Date   TROPONINI <0.03 04/07/2018   No results found for: DDIMER Does the Patient currently have chest pain? No     R Recommendations: See Admitting Provider Note  Report given to:   Additional Notes: n/a

## 2019-03-31 NOTE — ED Notes (Signed)
Admitting dr in room talking with patient at this time

## 2019-04-01 ENCOUNTER — Inpatient Hospital Stay: Payer: Medicare Other

## 2019-04-01 DIAGNOSIS — R4182 Altered mental status, unspecified: Secondary | ICD-10-CM

## 2019-04-01 LAB — BASIC METABOLIC PANEL
Anion gap: 14 (ref 5–15)
BUN: 49 mg/dL — ABNORMAL HIGH (ref 8–23)
CO2: 18 mmol/L — ABNORMAL LOW (ref 22–32)
Calcium: 8.7 mg/dL — ABNORMAL LOW (ref 8.9–10.3)
Chloride: 106 mmol/L (ref 98–111)
Creatinine, Ser: 1.82 mg/dL — ABNORMAL HIGH (ref 0.44–1.00)
GFR calc Af Amer: 33 mL/min — ABNORMAL LOW (ref >60)
GFR calc non Af Amer: 29 mL/min — ABNORMAL LOW (ref >60)
Glucose, Bld: 61 mg/dL — ABNORMAL LOW (ref 70–99)
Potassium: 3 mmol/L — ABNORMAL LOW (ref 3.5–5.1)
Sodium: 138 mmol/L (ref 135–145)

## 2019-04-01 LAB — CBC
HCT: 34.5 % — ABNORMAL LOW (ref 36.0–46.0)
Hemoglobin: 11.3 g/dL — ABNORMAL LOW (ref 12.0–15.0)
MCH: 31.2 pg (ref 26.0–34.0)
MCHC: 32.8 g/dL (ref 30.0–36.0)
MCV: 95.3 fL (ref 80.0–100.0)
Platelets: 332 10*3/uL (ref 150–400)
RBC: 3.62 MIL/uL — ABNORMAL LOW (ref 3.87–5.11)
RDW: 14.1 % (ref 11.5–15.5)
WBC: 9.9 10*3/uL (ref 4.0–10.5)
nRBC: 0 % (ref 0.0–0.2)

## 2019-04-01 LAB — BILIRUBIN, TOTAL: Total Bilirubin: 2.6 mg/dL — ABNORMAL HIGH (ref 0.3–1.2)

## 2019-04-01 MED ORDER — PHENOL 1.4 % MT LIQD
1.0000 | OROMUCOSAL | Status: DC | PRN
  Administered 2019-04-01 – 2019-04-03 (×2): 1 via OROMUCOSAL
  Filled 2019-04-01: qty 177

## 2019-04-01 MED ORDER — POTASSIUM CHLORIDE CRYS ER 20 MEQ PO TBCR
40.0000 meq | EXTENDED_RELEASE_TABLET | Freq: Once | ORAL | Status: AC
  Administered 2019-04-01: 18:00:00 40 meq via ORAL
  Filled 2019-04-01: qty 2

## 2019-04-01 NOTE — Progress Notes (Signed)
Spoke with Dr. Estanislado Pandy re: COVID results from 10/5. He said it was fine for this admission and did not need a repeat test.

## 2019-04-01 NOTE — Progress Notes (Signed)
Patient confused at this time and unable to answer admission questions.

## 2019-04-01 NOTE — Progress Notes (Signed)
[place] at [place] NAME: [name]    MR#:  [id number]  DATE OF BIRTH:  Nov 02, 1955  SUBJECTIVE:  CHIEF COMPLAINT:   Chief Complaint  Patient presents with  . Psychiatric Evaluation  Patient seen and evaluated today Awake and responds to verbal commands Less confused Oriented to self and place No fever  REVIEW OF SYSTEMS:    ROS  CONSTITUTIONAL: No documented fever. Has fatigue, weakness. No weight gain, no weight loss.  EYES: No blurry or double vision.  ENT: No tinnitus. No postnasal drip. No redness of the oropharynx.  RESPIRATORY: No cough, no wheeze, no hemoptysis. No dyspnea.  CARDIOVASCULAR: No chest pain. No orthopnea. No palpitations. No syncope.  GASTROINTESTINAL: No nausea, no vomiting or diarrhea. No abdominal pain. No melena or hematochezia.  GENITOURINARY: No dysuria or hematuria.  ENDOCRINE: No polyuria or nocturia. No heat or cold intolerance.  HEMATOLOGY: No anemia. No bruising. No bleeding.  INTEGUMENTARY: No rashes. No lesions.  MUSCULOSKELETAL: No arthritis. No swelling. No gout.  NEUROLOGIC: No numbness, tingling, or ataxia. No seizure-type activity.  PSYCHIATRIC: No anxiety. No insomnia. No ADD.   DRUG ALLERGIES:   Allergies  Allergen Reactions  . Benadryl [Diphenhydramine] Hives and Other (See Comments)    Reaction:  Hyperactivity   . Benadryl [Diphenhydramine] Other (See Comments)    My jaw locked and I could not speak, like a stroke.  . Cephalosporins Hives  . Codeine Nausea And Vomiting    VITALS:  Blood pressure 110/89, pulse 99, temperature 98.1 F (36.7 C), temperature source Oral, resp. rate 20, SpO2 98 %.  PHYSICAL EXAMINATION:   Physical Exam  GENERAL:  63 y.o.-year-old patient lying in the bed with no acute distress.  EYES: Pupils equal, round, reactive to light and accommodation. No scleral icterus. Extraocular muscles intact.  HEENT: Head atraumatic, normocephalic. Oropharynx dry  and nasopharynx clear.  NECK:  Supple, no jugular venous distention. No thyroid enlargement, no tenderness.  LUNGS: Normal breath sounds bilaterally, no wheezing, rales, rhonchi. No use of accessory muscles of respiration.  CARDIOVASCULAR: S1, S2 normal. No murmurs, rubs, or gallops.  ABDOMEN: Soft, nontender, nondistended. Bowel sounds present. No organomegaly or mass.  EXTREMITIES: No cyanosis, clubbing or edema b/l.    NEUROLOGIC: Cranial nerves II through XII are intact. No focal Motor or sensory deficits b/l.   PSYCHIATRIC: The patient is alert and oriented x 2.  SKIN: No obvious rash, lesion, or ulcer.   LABORATORY PANEL:   CBC Recent Labs  Lab 04/01/19 0338  WBC 9.9  HGB 11.3*  HCT 34.5*  PLT 332   ------------------------------------------------------------------------------------------------------------------ Chemistries  Recent Labs  Lab 03/31/19 2046 04/01/19 0338  NA 136 138  K 3.1* 3.0*  CL 103 106  CO2 16* 18*  GLUCOSE 63* 61*  BUN 58* 49*  CREATININE 2.34*  2.36* 1.82*  CALCIUM 8.6* 8.7*  MG 1.9  --   AST 17  --   ALT 6  --   ALKPHOS 70  --   BILITOT 2.6* 2.6*   ------------------------------------------------------------------------------------------------------------------  Cardiac Enzymes No results for input(s): TROPONINI in the last 168 hours. ------------------------------------------------------------------------------------------------------------------  RADIOLOGY:  Ct Head Wo Contrast  Result Date: 03/31/2019 CLINICAL DATA:  Altered level of consciousness. EXAM: CT HEAD WITHOUT CONTRAST TECHNIQUE: Contiguous axial images were obtained from the base of the skull through the vertex without intravenous contrast. COMPARISON:  Head CT 03/28/2019 FINDINGS: Brain: The ventricles are normal in size and configuration. No extra-axial fluid  collections are identified. The gray-white differentiation is maintained. No CT findings for acute hemispheric  infarction or intracranial hemorrhage. No mass lesions. The brainstem and cerebellum are normal. Vascular: No hyperdense vessels or obvious aneurysm. Skull: No acute skull fracture.  No bone lesion. Sinuses/Orbits: The paranasal sinuses and mastoid air cells are clear. The globes are intact. Other: No scalp lesions, laceration or hematoma. IMPRESSION: Normal head CT. Electronically Signed   By: Marijo Sanes M.D.   On: 03/31/2019 15:55     ASSESSMENT AND PLAN:   63 year old female patient with history of anxiety disorder bipolar disorder alcohol abuse, neuropathy currently under hospitalist service  -Acute kidney injury Continue IV fluids Hold thiazide diuretic Monitor renal function Renal function improving  -Hypokalemia Replace potassium aggressively  -Acute metabolic encephalopathy Improving with IV fluids Supportive care  -Tobacco abuse Tobacco cessation counseled to the patient for 6 minutes Nicotine patch offered  -History of alcohol use CIWA protocol  -Leukocytosis resolved Probably secondary to dehydration Urinalysis did not show any infection   All the records are reviewed and case discussed with Care Management/Social Worker. Management plans discussed with the patient, family and they are in agreement.  CODE STATUS: Full code  DVT Prophylaxis: SCDs  TOTAL TIME TAKING CARE OF THIS PATIENT: 36 minutes.   POSSIBLE D/C IN 1 to 2 DAYS, DEPENDING ON CLINICAL CONDITION.  Saundra Shelling M.D on 04/01/2019 at 9:46 AM  Between 7am to 6pm - Pager - [phone]  After 6pm go to [email or web address] - password [email or web address] [facility]  Office  [phone]  CC: Primary care physician; Dion Body, MD  Note: This dictation was prepared with Dragon dictation along with smaller phrase technology. Any transcriptional errors that result from this process are unintentional.

## 2019-04-01 NOTE — Consult Note (Signed)
[facility] Consult   Reason for Consult: bipolar disorder Referring Physician: Reatha Harps Pyreddy Patient Identification: [name] MRN:  [id number] Principal Diagnosis: <principal problem not specified> Diagnosis:  Active Problems:   ARF (acute renal failure) ([place])   Total Time spent with patient: 45 minutes  Subjective:   [name] is a 63 y.o. female patient admitted with metabolic encephalopathy.  HPI: Patient is a 63 year old female with a history of anxiety and depression who presents with acute kidney injury, leukocytosis in context of recent surgery.  Patient had a very severe tremor as well as altered mental status.  Yesterday patient was very confused.  Today patient is looking better.  She is less confused.  She is oriented to person however not to place or time.  She is able to state who is president after prompted.  Patient denies any psychiatric complaints at this time.  Patient denies suicidal ideation or hallucinations.  Patient denies history of hallucinations in the past.  Patient still remains quite confused as to the situation what prompted her admission.  Patient felt that she was at a different hospital and was surprised to learn that she was at [name].  Patient patient states that she is sleeping fine without incident.  Patient reports being hungry but understands that she needs procedure prior to eating her food.  Patient is unable to explain how she became so confused, rather referring back to that she recently had surgery and is not sure what would have happened.  Patient does seem to be much more oriented today than previously, and is reassured by that fact. Patient reports compliance with psychiatric medications which she states has helped her with her her mood over the years.  She reports seeing a psychiatrist at [facility].  Past Psychiatric History: Patient has a history of anxiety and depression.  Patient and her had bipolar  disorder added to her chart.  Looking at chart review there is no evidence of bipolar disorder including diagnosis from recent psychiatric inpatient stay approximately 4 years ago which patient was diagnosed with MDD anxiety and borderline personality disorder.  Also from chart review patient has no history of psychotic symptoms.  Risk to Self:  No Risk to Others:  No  prior Inpatient Therapy:  Yes Prior Outpatient Therapy:  Yes  Past Medical History:  Past Medical History:  Diagnosis Date  . Anxiety   . Anxiety    panic anxiety disorder  . Arthritis   . Asthma    chronic asthmatic bronchitis  . Bipolar disorder ([place])   . Cancer [facility])    Desmoid tumor left forearm  . Depression   . ETOH abuse   . Heart murmur   . Insomnia   . Neuropathy     Past Surgical History:  Procedure Laterality Date  . ABDOMINAL HYSTERECTOMY    . ABDOMINAL HYSTERECTOMY  2007  . 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 ARM AMPUTATION AT SHOULDER Left   . CESAREAN SECTION    . [place]   x 2  . COLONOSCOPY    . ESOPHAGOGASTRODUODENOSCOPY (EGD) WITH PROPOFOL N/A 04/27/2018   Procedure: ESOPHAGOGASTRODUODENOSCOPY (EGD) WITH PROPOFOL;  Surgeon: Toledo, Benay Pike, MD;  Location: [facility] ENDOSCOPY;  Service: Gastroenterology;  Laterality: N/A;  . JOINT REPLACEMENT Right 2012   hip  . LUMBAR LAMINECTOMY/DECOMPRESSION MICRODISCECTOMY Left 04/16/2016   Procedure: LUMBAR LAMINECTOMY/DECOMPRESSION MICRODISCECTOMY 1 LEVEL;  Surgeon: Meade Maw, MD;  Location: [facility] ORS;  Service: Neurosurgery;  Laterality: Left;  Left L4-5 far lateral discectomy, left L4-5 laminoforaminotomy  . TOTAL HIP ARTHROPLASTY Left 09/17/2015   Procedure: TOTAL HIP ARTHROPLASTY;  Surgeon: Dereck Leep, MD;  Location: [facility] ORS;  Service: Orthopedics;  Laterality: Left;  . TOTAL HIP REVISION Left 03/16/2019   Procedure: TOTAL HIP REVISION ARTHROPLASTY WITH CONVERSION TO A CONSTRAINED LINER.;   Surgeon: Dereck Leep, MD;  Location: [facility] ORS;  Service: Orthopedics;  Laterality: Left;  . WISDOM TOOTH EXTRACTION     Family History:  Family History  Problem Relation Age of Onset  . COPD Mother    Family Psychiatric  History: Patient denies Social History:  Social History   Substance and Sexual Activity  Alcohol Use Yes   Comment: 2 40oz beers daily     Social History   Substance and Sexual Activity  Drug Use No   Comment: Patient denies     Social History   Socioeconomic History  . Marital status: Married    Spouse name: Not on file  . Number of children: Not on file  . Years of education: Not on file  . Highest education level: Not on file  Occupational History  . Not on file  Social Needs  . Financial resource strain: Not on file  . Food insecurity    Worry: Not on file    Inability: Not on file  . Transportation needs    Medical: Not on file    Non-medical: Not on file  Tobacco Use  . Smoking status: Current Every Day Smoker    Packs/day: 0.25    Types: Cigarettes  . Smokeless tobacco: Never Used  Substance and Sexual Activity  . Alcohol use: Yes    Comment: 2 40oz beers daily  . Drug use: No    Comment: Patient denies   . Sexual activity: Yes  Lifestyle  . Physical activity    Days per week: Not on file    Minutes per session: Not on file  . Stress: Not on file  Relationships  . Social Herbalist on phone: Not on file    Gets together: Not on file    Attends religious service: Not on file    Active member of club or organization: Not on file    Attends meetings of clubs or organizations: Not on file    Relationship status: Not on file  Other Topics Concern  . Not on file  Social History Narrative   ** Merged History Encounter **       Additional Social History: Patient lives at home with by herself.  Has a son who lives in [place].  States that she occasionally drinks denies any recent alcohol use.    Allergies:    Allergies  Allergen Reactions  . Benadryl [Diphenhydramine] Hives and Other (See Comments)    Reaction:  Hyperactivity   . Benadryl [Diphenhydramine] Other (See Comments)    My jaw locked and I could not speak, like a stroke.  . Cephalosporins Hives  . Codeine Nausea And Vomiting    Labs:  Results for orders placed or performed during the hospital encounter of 03/31/19 (from the past 48 hour(s))  Blood culture (routine x 2)     Status: None (Preliminary result)   Collection Time: 03/31/19  2:04 PM   Specimen: BLOOD  Result Value Ref Range   Specimen Description BLOOD RIGHT ANTECUBITAL    Special Requests  BOTTLES DRAWN AEROBIC AND ANAEROBIC Blood Culture adequate volume   Culture      NO GROWTH < 24 HOURS Performed at [facility], [address] [place], [place] [address]    Report Status PENDING   Blood culture (routine x 2)     Status: None (Preliminary result)   Collection Time: 03/31/19  2:04 PM   Specimen: BLOOD  Result Value Ref Range   Specimen Description BLOOD BLOOD RIGHT HAND    Special Requests      BOTTLES DRAWN AEROBIC AND ANAEROBIC Blood Culture adequate volume   Culture      NO GROWTH < 24 HOURS Performed at [name], [address] [place], [place] [address]    Report Status PENDING   Comprehensive metabolic panel     Status: Abnormal   Collection Time: 03/31/19  2:46 PM  Result Value Ref Range   Sodium 134 (L) 135 - 145 mmol/L   Potassium 3.4 (L) 3.5 - 5.1 mmol/L   Chloride 98 98 - 111 mmol/L   CO2 16 (L) 22 - 32 mmol/L   Glucose, Bld 71 70 - 99 mg/dL   BUN 63 (H) 8 - 23 mg/dL   Creatinine, Ser 2.86 (H) 0.44 - 1.00 mg/dL   Calcium 9.5 8.9 - 10.3 mg/dL   Total Protein 7.9 6.5 - 8.1 g/dL   Albumin 4.3 3.5 - 5.0 g/dL   AST 20 15 - 41 U/L   ALT <5 0 - 44 U/L   Alkaline Phosphatase 86 38 - 126 U/L   Total Bilirubin 2.8 (H) 0.3 - 1.2 mg/dL   GFR calc non Af Amer 17 (L) >60 mL/min   GFR calc Af Amer 19 (L) >60 mL/min    Anion gap 20 (H) 5 - 15    Comment: Performed at [name], [address] [place], [place] [address]  Ethanol     Status: None   Collection Time: 03/31/19  2:46 PM  Result Value Ref Range   Alcohol, Ethyl (B) <10 <10 mg/dL    Comment: (NOTE) Lowest detectable limit for serum alcohol is 10 mg/dL. For medical purposes only. Performed at [facility], [address] [place], [place] [id number]   Salicylate level     Status: None   Collection Time: 03/31/19  2:46 PM  Result Value Ref Range   Salicylate Lvl [id number] 2.8 - 30.0 mg/dL    Comment: Performed at [facility], [address] [place], [place] [address]  Acetaminophen level     Status: Abnormal   Collection Time: 03/31/19  2:46 PM  Result Value Ref Range   Acetaminophen (Tylenol), Serum <10 (L) 10 - 30 ug/mL    Comment: (NOTE) Therapeutic concentrations vary significantly. A range of 10-30 ug/mL  may be an effective concentration for many patients. However, some  are best treated at concentrations outside of this range. Acetaminophen concentrations >150 ug/mL at 4 hours after ingestion  and >50 ug/mL at 12 hours after ingestion are often associated with  toxic reactions. Performed at [facility], [address] [place],  [address]   cbc     Status: Abnormal   Collection Time: 03/31/19  2:46 PM  Result Value Ref Range   WBC 15.9 (H) 4.0 - 10.5 K/uL   RBC 3.84 (L) 3.87 - 5.11 MIL/uL   Hemoglobin 12.1 12.0 - 15.0 g/dL   HCT 36.2 36.0 - 46.0 %   MCV 94.3 80.0 - 100.0 fL  MCH 31.5 26.0 - 34.0 pg   MCHC 33.4 30.0 - 36.0 g/dL   RDW 14.0 11.5 - 15.5 %   Platelets 437 (H) 150 - 400 K/uL   nRBC 0.0 0.0 - 0.2 %    Comment: Performed at [facility], [address], [place], [place] [address]  Urine Drug Screen, Qualitative     Status: None   Collection Time: 03/31/19  2:46 PM  Result Value Ref Range   Tricyclic, Ur Screen NONE DETECTED NONE DETECTED    Amphetamines, Ur Screen NONE DETECTED NONE DETECTED   MDMA (Ecstasy)Ur Screen NONE DETECTED NONE DETECTED   Cocaine Metabolite,Ur [place] NONE DETECTED NONE DETECTED   Opiate, Ur Screen NONE DETECTED NONE DETECTED   Phencyclidine (PCP) Ur S NONE DETECTED NONE DETECTED   Cannabinoid 50 Ng, Ur [place] NONE DETECTED NONE DETECTED   Barbiturates, Ur Screen NONE DETECTED NONE DETECTED   Benzodiazepine, Ur Scrn NONE DETECTED NONE DETECTED   Methadone Scn, Ur NONE DETECTED NONE DETECTED    Comment: (NOTE) Tricyclics + metabolites, urine    Cutoff 1000 ng/mL Amphetamines + metabolites, urine  Cutoff 1000 ng/mL MDMA (Ecstasy), urine              Cutoff 500 ng/mL Cocaine Metabolite, urine          Cutoff 300 ng/mL Opiate + metabolites, urine        Cutoff 300 ng/mL Phencyclidine (PCP), urine         Cutoff 25 ng/mL Cannabinoid, urine                 Cutoff 50 ng/mL Barbiturates + metabolites, urine  Cutoff 200 ng/mL Benzodiazepine, urine              Cutoff 200 ng/mL Methadone, urine                   Cutoff 300 ng/mL The urine drug screen provides only a preliminary, unconfirmed analytical test result and should not be used for non-medical purposes. Clinical consideration and professional judgment should be applied to any positive drug screen result due to possible interfering substances. A more specific alternate chemical method must be used in order to obtain a confirmed analytical result. Gas chromatography / mass spectrometry (GC/MS) is the preferred confirmat ory method. Performed at [facility], [address] [place], [place] [address]   Urinalysis, Complete w Microscopic     Status: Abnormal   Collection Time: 03/31/19  2:46 PM  Result Value Ref Range   Color, Urine AMBER (A) YELLOW    Comment: BIOCHEMICALS MAY BE AFFECTED BY COLOR   APPearance CLOUDY (A) CLEAR   Specific Gravity, Urine 1.016 1.005 - 1.030   pH 5.0 5.0 - 8.0   Glucose, UA NEGATIVE NEGATIVE mg/dL   Hgb urine  dipstick NEGATIVE NEGATIVE   Bilirubin Urine NEGATIVE NEGATIVE   Ketones, ur 5 (A) NEGATIVE mg/dL   Protein, ur 100 (A) NEGATIVE mg/dL   Nitrite NEGATIVE NEGATIVE   Leukocytes,Ua NEGATIVE NEGATIVE   Squamous Epithelial / LPF 6-10 0 - 5   WBC, UA 6-10 0 - 5 WBC/hpf   RBC / HPF 0-5 0 - 5 RBC/hpf   Bacteria, UA MANY (A) NONE SEEN   Mucus PRESENT    Hyaline Casts, UA PRESENT     Comment: Performed at [facility], [address], [place], [place] [address]  Comprehensive metabolic panel     Status: Abnormal   Collection Time: 03/31/19  8:46 PM  Result Value Ref Range   Sodium 136 135 - 145 mmol/L   Potassium 3.1 (L) 3.5 - 5.1 mmol/L   Chloride 103 98 - 111 mmol/L   CO2 16 (L) 22 - 32 mmol/L   Glucose, Bld 63 (L) 70 - 99 mg/dL   BUN 58 (H) 8 - 23 mg/dL   Creatinine, Ser 2.34 (H) 0.44 - 1.00 mg/dL   Calcium 8.6 (L) 8.9 - 10.3 mg/dL   Total Protein 6.7 6.5 - 8.1 g/dL   Albumin 3.5 3.5 - 5.0 g/dL   AST 17 15 - 41 U/L   ALT 6 0 - 44 U/L   Alkaline Phosphatase 70 38 - 126 U/L   Total Bilirubin 2.6 (H) 0.3 - 1.2 mg/dL   GFR calc non Af Amer 21 (L) >60 mL/min   GFR calc Af Amer 25 (L) >60 mL/min   Anion gap 17 (H) 5 - 15    Comment: Performed at [facility], [address] [place], [place] [address]  Magnesium     Status: None   Collection Time: 03/31/19  8:46 PM  Result Value Ref Range   Magnesium 1.9 1.7 - 2.4 mg/dL    Comment: Performed at [facility], [address] [place], [place] [address]  Phosphorus     Status: None   Collection Time: 03/31/19  8:46 PM  Result Value Ref Range   Phosphorus 4.2 2.5 - 4.6 mg/dL    Comment: Performed at [facility], [address] [place], [place] [address]  CBC     Status: Abnormal   Collection Time: 03/31/19  8:46 PM  Result Value Ref Range   WBC 12.1 (H) 4.0 - 10.5 K/uL   RBC 3.49 (L) 3.87 - 5.11 MIL/uL   Hemoglobin 11.0 (L) 12.0 - 15.0 g/dL   HCT 32.9 (L) 36.0 - 46.0 %   MCV 94.3 80.0 -  100.0 fL   MCH 31.5 26.0 - 34.0 pg   MCHC 33.4 30.0 - 36.0 g/dL   RDW 14.1 11.5 - 15.5 %   Platelets 329 150 - 400 K/uL   nRBC 0.0 0.0 - 0.2 %    Comment: Performed at [facility], [address] [place], [place] [address]  Creatinine, serum     Status: Abnormal   Collection Time: 03/31/19  8:46 PM  Result Value Ref Range   Creatinine, Ser 2.36 (H) 0.44 - 1.00 mg/dL   GFR calc non Af Amer 21 (L) >60 mL/min   GFR calc Af Amer 24 (L) >60 mL/min    Comment: Performed at  [facility], [address] [place], [place] [id number]  Basic metabolic panel     Status: Abnormal   Collection Time: 04/01/19  3:38 AM  Result Value Ref Range   Sodium 138 135 - 145 mmol/L   Potassium 3.0 (L) 3.5 - 5.1 mmol/L   Chloride 106 98 - 111 mmol/L   CO2 18 (L) 22 - 32 mmol/L   Glucose, Bld 61 (L) 70 - 99 mg/dL   BUN 49 (H) 8 - 23 mg/dL   Creatinine, Ser 1.82 (H) 0.44 - 1.00 mg/dL   Calcium 8.7 (L) 8.9 - 10.3 mg/dL   GFR calc non Af Amer 29 (L) >60 mL/min   GFR calc Af Amer 33 (L) >60 mL/min   Anion gap 14 5 - 15    Comment: Performed at [facility], [address] [place], [place] [address]  CBC  Status: Abnormal   Collection Time: 04/01/19  3:38 AM  Result Value Ref Range   WBC 9.9 4.0 - 10.5 K/uL   RBC 3.62 (L) 3.87 - 5.11 MIL/uL   Hemoglobin 11.3 (L) 12.0 - 15.0 g/dL   HCT 34.5 (L) 36.0 - 46.0 %   MCV 95.3 80.0 - 100.0 fL   MCH 31.2 26.0 - 34.0 pg   MCHC 32.8 30.0 - 36.0 g/dL   RDW 14.1 11.5 - 15.5 %   Platelets 332 150 - 400 K/uL   nRBC 0.0 0.0 - 0.2 %    Comment: Performed at [facility], [address] [place], [place] [address]  Bilirubin, total     Status: Abnormal   Collection Time: 04/01/19  3:38 AM  Result Value Ref Range   Total Bilirubin 2.6 (H) 0.3 - 1.2 mg/dL    Comment: Performed at [facility], [address] [place], [place] [address]    Current Facility-Administered Medications  Medication Dose Route Frequency  Provider Last Rate Last Dose  . 0.9 %  sodium chloride infusion   Intravenous Continuous Demetrios Loll, MD 75 mL/hr at 04/01/19 [phone]    . acetaminophen (TYLENOL) tablet 650 mg  650 mg Oral Q6H PRN Demetrios Loll, MD       Or  . acetaminophen (TYLENOL) suppository 650 mg  650 mg Rectal Q6H PRN Demetrios Loll, MD      . albuterol (PROVENTIL) (2.5 MG/3ML) 0.083% nebulizer solution 2.5 mg  2.5 mg Nebulization Q2H PRN Demetrios Loll, MD      . amantadine (SYMMETREL) capsule 200 mg  200 mg Oral BID Demetrios Loll, MD      . benztropine (COGENTIN) tablet 1 mg  1 mg Oral QHS Demetrios Loll, MD      . bisacodyl (DULCOLAX) EC tablet 5 mg  5 mg Oral Daily PRN Demetrios Loll, MD      . calcium carbonate (TUMS - dosed in mg elemental calcium) chewable tablet 500 mg  500 mg Oral Q breakfast Demetrios Loll, MD      . carbidopa-levodopa (SINEMET IR) 25-100 MG per tablet immediate release 3 tablet  3 tablet Oral TID Demetrios Loll, MD      . folic acid (FOLVITE) tablet 1 mg  1 mg Oral Daily Demetrios Loll, MD      . heparin injection 5,000 Units  5,000 Units Subcutaneous Q8H Demetrios Loll, MD   5,000 Units at 04/01/19 0550  . LORazepam (ATIVAN) tablet 1-4 mg  1-4 mg Oral Q1H PRN Demetrios Loll, MD       Or  . LORazepam (ATIVAN) injection 1-4 mg  1-4 mg Intravenous Q1H PRN Demetrios Loll, MD   2 mg at 04/01/19 0209  . multivitamin with minerals tablet 1 tablet  1 tablet Oral Daily Demetrios Loll, MD      . nicotine (NICODERM CQ - dosed in mg/24 hours) patch 14 mg  14 mg Transdermal Daily Demetrios Loll, MD   14 mg at 04/01/19 1056  . ondansetron (ZOFRAN) tablet 4 mg  4 mg Oral Q6H PRN Demetrios Loll, MD       Or  . ondansetron [facility]) injection 4 mg  4 mg Intravenous Q6H PRN Demetrios Loll, MD      . oxyCODONE (Oxy IR/ROXICODONE) immediate release tablet 5 mg  5 mg Oral Q4H PRN Demetrios Loll, MD      . phenol [facility]) mouth spray 1 spray  1 spray Mouth/Throat PRN Demetrios Loll, MD      .  potassium chloride SA (KLOR-CON) CR tablet 40 mEq  40 mEq Oral Once Demetrios Loll, MD       . potassium chloride SA (KLOR-CON) CR tablet 40 mEq  40 mEq Oral Once Saundra Shelling, MD      . propranolol (INDERAL) tablet 20 mg  20 mg Oral TID Demetrios Loll, MD      . senna-docusate (Senokot-S) tablet 1 tablet  1 tablet Oral QHS PRN Demetrios Loll, MD      . thiamine (VITAMIN B-1) tablet 100 mg  100 mg Oral Daily Demetrios Loll, MD       Or  . thiamine (B-1) injection 100 mg  100 mg Intravenous Daily Demetrios Loll, MD      . traZODone (DESYREL) tablet 300 mg  300 mg Oral QHS PRN Demetrios Loll, MD      . venlafaxine XR (EFFEXOR-XR) 24 hr capsule 300 mg  300 mg Oral Q breakfast Demetrios Loll, MD        Musculoskeletal: Strength & Muscle Tone: decreased Gait & Station: unable to stand Patient leans: N/A  Psychiatric Specialty Exam: Physical Exam  Review of Systems  Constitutional: Negative for diaphoresis and fever.  HENT: Negative for hearing loss.   Skin: Negative for rash.  Psychiatric/Behavioral: Negative for depression, hallucinations and suicidal ideas. The patient is nervous/anxious.     Blood pressure 103/72, pulse 88, temperature 99 F (37.2 C), temperature source Oral, resp. rate 20, SpO2 94 %.There is no height or weight on file to calculate BMI.  General Appearance: Casual  Eye Contact:  Good  Speech:  Clear and Coherent  Volume:  Normal  Mood:  Euthymic  Affect:  Constricted  Thought Process:  Goal Directed  Orientation:  Negative  Thought Content:  Abstract Reasoning  Suicidal Thoughts:  No  Homicidal Thoughts:  No  Memory:  Negative  Judgement:  Fair  Insight:  Fair  Psychomotor Activity:  Tremor  Concentration:  Concentration: Fair  Recall:  [place] of Knowledge:  Fair  Language:  Fair  Akathisia:  No  Handed:  Right  AIMS (if indicated):     Assets:  Desire for Improvement Housing Resilience  ADL's:  Impaired  Cognition:  Impaired,  Mild  Sleep:        Treatment Plan Summary: Daily contact with patient to assess and evaluate symptoms and progress in  treatment and Medication management   63 year old patient with history of anxiety and depression presenting with tremor anxiety hallucinations and altered mental status.  Thankfully altered mental status is improving patient no longer experiencing hallucinations.  Patient currently denying psychiatric symptoms.  Although patient is still somewhat confused.  Writer feels that bipolar disorder was wrongfully attributed to this patient as chart review revealed no such history of manic or psychotic episodes and recent visit with psychiatrist where MDD was given as a diagnosis.  For that reason, writer feels that MDD is the most appropriate diagnosis at this time.  Patient also not complaining of depression or suicidal ideations at this time reflecting that MDD is most likely stable due to medication and outpatient treatment compliance.  Previous change in mental status can most likely be attributed to combination of metabolic encephalopathy versus L-dopa induced.   Diagnosis: Major depressive disorder, stable  Medications: continue home doses of medications:  Venlafaxine 300mg  qam Trazodone 300mg  qhs  Lorazepam used for CIWA    Disposition: No evidence of imminent risk to self or others at present.   Supportive therapy provided about  ongoing stressors.  Dixie Dials, MD 04/01/2019 12:35 PM

## 2019-04-01 NOTE — Plan of Care (Signed)
Patient impulsive, confused, anxious and hallucinating. Patient given prn Ativan and now resting quietly in bed.

## 2019-04-01 NOTE — Progress Notes (Signed)
Advanced care plan. Purpose of the Encounter: CODE STATUS Parties in Attendance: Patient Patient's Decision Capacity:Good Subjective/Patient's story: [name]  is a 63 y.o. female with a known history of multiple medical problems as below.  The patient presents the ED with above chief complaints.  The patient was found by neighbors drinking a bottle of oral gel due to tooth pain this morning.  The patient is alert, awake now, only complains of right leg pain.  She is not a good historian.  According to Dr. Jimmye Norman, the patient is s/p a hip replacement on September 23.  She is confused and reported has recent alcohol.  She is found acute renal failure, suspected to have UTI and given Cipro in the ED. Dr. Jimmye Norman request admission. Objective/Medical story Patient needs IV fluid hydration monitoring of renal function Needs WBC count to be followed up Needs evaluation for elevated bilirubin Goals of care determination:  Advance care directives goals of care treatment plan discussed Patient wants everything done which includes CPR, intubation ventilator if the need arises CODE STATUS: Full code Time spent discussing advanced care planning: 16 minutes

## 2019-04-02 ENCOUNTER — Other Ambulatory Visit: Payer: Self-pay

## 2019-04-02 DIAGNOSIS — R450 Nervousness: Secondary | ICD-10-CM

## 2019-04-02 DIAGNOSIS — R4182 Altered mental status, unspecified: Secondary | ICD-10-CM

## 2019-04-02 DIAGNOSIS — F4310 Post-traumatic stress disorder, unspecified: Secondary | ICD-10-CM

## 2019-04-02 DIAGNOSIS — F411 Generalized anxiety disorder: Secondary | ICD-10-CM

## 2019-04-02 LAB — COMPREHENSIVE METABOLIC PANEL
ALT: 6 U/L (ref 0–44)
AST: 17 U/L (ref 15–41)
Albumin: 3.5 g/dL (ref 3.5–5.0)
Alkaline Phosphatase: 71 U/L (ref 38–126)
Anion gap: 17 — ABNORMAL HIGH (ref 5–15)
BUN: 29 mg/dL — ABNORMAL HIGH (ref 8–23)
CO2: 19 mmol/L — ABNORMAL LOW (ref 22–32)
Calcium: 8.9 mg/dL (ref 8.9–10.3)
Chloride: 107 mmol/L (ref 98–111)
Creatinine, Ser: 0.96 mg/dL (ref 0.44–1.00)
GFR calc Af Amer: >60 mL/min (ref >60)
GFR calc non Af Amer: >60 mL/min (ref >60)
Glucose, Bld: 89 mg/dL (ref 70–99)
Potassium: 3.9 mmol/L (ref 3.5–5.1)
Sodium: 143 mmol/L (ref 135–145)
Total Bilirubin: 2 mg/dL — ABNORMAL HIGH (ref 0.3–1.2)
Total Protein: 6.6 g/dL (ref 6.5–8.1)

## 2019-04-02 LAB — URINE CULTURE
Culture: >=100000 — AB
Special Requests: NORMAL

## 2019-04-02 MED ORDER — PANTOPRAZOLE SODIUM 40 MG IV SOLR
40.0000 mg | Freq: Two times a day (BID) | INTRAVENOUS | Status: DC
  Administered 2019-04-02 (×2): 40 mg via INTRAVENOUS
  Filled 2019-04-02 (×2): qty 40

## 2019-04-02 MED ORDER — DOCUSATE SODIUM 100 MG PO CAPS
100.0000 mg | ORAL_CAPSULE | Freq: Two times a day (BID) | ORAL | Status: DC
  Administered 2019-04-02 – 2019-04-04 (×5): 100 mg via ORAL
  Filled 2019-04-02 (×5): qty 1

## 2019-04-02 NOTE — Progress Notes (Signed)
PT Cancellation Note  Patient Details Name: [name] MRN: [id number] DOB: 04/30/1956   Cancelled Treatment:    Reason Eval/Treat Not Completed: Other (comment): Upon entering room pt found near vomiting and motioning for need of emesis bag which was provided, nursing notified.  Pt stated that she did not feel well enough to attempt PT evaluation this date. Will attempt to see pt at a future date/time as medically appropriate.        Linus Salmons PT, DPT 04/02/19, 10:13 AM

## 2019-04-02 NOTE — Progress Notes (Signed)
[place] [facility] [place] NAME: [name]    MR#:  [id number]  DATE OF BIRTH:  January 24, 1956  SUBJECTIVE:  CHIEF COMPLAINT:   Chief Complaint  Patient presents with  . Psychiatric Evaluation  Patient seen and evaluated today Confused on and off.  Complains of nausea, requesting laxatives. REVIEW OF SYSTEMS:    ROS  Unable to obtain review of systems because of confusion.  DRUG ALLERGIES:   Allergies  Allergen Reactions  . Benadryl [Diphenhydramine] Hives and Other (See Comments)    Reaction:  Hyperactivity   . Benadryl [Diphenhydramine] Other (See Comments)    My jaw locked and I could not speak, like a stroke.  . Cephalosporins Hives  . Codeine Nausea And Vomiting    VITALS:  Blood pressure 138/69, pulse 61, temperature 98.4 F (36.9 C), temperature source Oral, resp. rate 18, SpO2 96 %.  PHYSICAL EXAMINATION:   Physical Exam  GENERAL:  63 y.o.-year-old patient lying in the bed with no acute distress.  EYES: Pupils equal, round, reactive to light and accommodation. No scleral icterus. Extraocular muscles intact.  HEENT: Head atraumatic, normocephalic. Oropharynx dry and nasopharynx clear.  NECK:  Supple, no jugular venous distention. No thyroid enlargement, no tenderness.  LUNGS: Normal breath sounds bilaterally, no wheezing, rales, rhonchi. No use of accessory muscles of respiration.  CARDIOVASCULAR: S1, S2 normal. No murmurs, rubs, or gallops.  ABDOMEN: Soft, nontender, nondistended. Bowel sounds present. No organomegaly or mass.  EXTREMITIES: No cyanosis, clubbing or edema b/l.    NEUROLOGIC: No gross neurological deficit observed. PSYCHIATRIC: ORIENTED TO PERSON BUT NOT PLACE AND TIME.   SKIN: No obvious rash, lesion, or ulcer.   LABORATORY PANEL:   CBC Recent Labs  Lab 04/01/19 0338  WBC 9.9  HGB 11.3*  HCT 34.5*  PLT 332    ------------------------------------------------------------------------------------------------------------------ Chemistries  Recent Labs  Lab 03/31/19 2046  04/02/19 0532  NA 136   < > 143  K 3.1*   < > 3.9  CL 103   < > 107  CO2 16*   < > 19*  GLUCOSE 63*   < > 89  BUN 58*   < > 29*  CREATININE 2.34*  2.36*   < > 0.96  CALCIUM 8.6*   < > 8.9  MG 1.9  --   --   AST 17  --  17  ALT 6  --  6  ALKPHOS 70  --  71  BILITOT 2.6*   < > 2.0*   < > = values in this interval not displayed.   ------------------------------------------------------------------------------------------------------------------  Cardiac Enzymes No results for input(s): TROPONINI in the last 168 hours. ------------------------------------------------------------------------------------------------------------------  RADIOLOGY:  Ct Head Wo Contrast  Result Date: 03/31/2019 CLINICAL DATA:  Altered level of consciousness. EXAM: CT HEAD WITHOUT CONTRAST TECHNIQUE: Contiguous axial images were obtained from the base of the skull through the vertex without intravenous contrast. COMPARISON:  Head CT 03/28/2019 FINDINGS: Brain: The ventricles are normal in size and configuration. No extra-axial fluid collections are identified. The gray-white differentiation is maintained. No CT findings for acute hemispheric infarction or intracranial hemorrhage. No mass lesions. The brainstem and cerebellum are normal. Vascular: No hyperdense vessels or obvious aneurysm. Skull: No acute skull fracture.  No bone lesion. Sinuses/Orbits: The paranasal sinuses and mastoid air cells are clear. The globes are intact. Other: No scalp lesions, laceration or hematoma. IMPRESSION: Normal head CT. Electronically Signed   By: Ricky Stabs.D.  On: 03/31/2019 15:55   US Abdomen Limited Ruq  Result Date: 04/01/2019 CLINICAL DATA:  Gallbladder disease EXAM: ULTRASOUND ABDOMEN LIMITED RIGHT UPPER QUADRANT COMPARISON:  None. FINDINGS:  Gallbladder: There are gallstones with gallbladder sludge. The gallbladder wall is not thickened. The sonographic Percell Miller sign is reported as negative. Common bile duct: Diameter: 4 mm Liver: No focal lesion identified. Within normal limits in parenchymal echogenicity. Portal vein is patent on color Doppler imaging with normal direction of blood flow towards the liver. Other: The right kidney is somewhat echogenic. IMPRESSION: 1. There is cholelithiasis without secondary signs of acute cholecystitis. 2. Echogenic right kidney which can be seen in patients with medical renal disease. Electronically Signed   By: Constance Holster M.D.   On: 04/01/2019 18:20     ASSESSMENT AND PLAN:   63 year old female patient with history of anxiety disorder bipolar disorder alcohol abuse, neuropathy currently under hospitalist service  -Acute kidney injury: Resolving. Continue IV fluids, patient still has poor p.o. intake. Hold thiazide diuretic Monitor renal function Renal function improving  -Hypokalemia Replace potassium aggressively, potassium improved.  -Acute metabolic encephalopathy improved, has underlying bipolar disorder, seen by psychiatrist. Improving with IV fluids Supportive care  -Tobacco abuse Tobacco cessation counseled to the patient for 6 minutes Nicotine patch offered  -History of alcohol use CIWA protocol  -Leukocytosis resolved Probably secondary to dehydration Urinalysis did not show any infection Hopefully if patient can go to psych ward tomorrow.  Patient appears to be improving from medical standpoint of view.  All the records are reviewed and case discussed with Care Management/Social Worker. Management plans discussed with the patient, family and they are in agreement.  CODE STATUS: Full code  DVT Prophylaxis: SCDs  TOTAL TIME TAKING CARE OF THIS PATIENT: 36 minutes.   POSSIBLE D/C IN 1 to 2 DAYS, DEPENDING ON CLINICAL CONDITION.  Epifanio Lesches M.D on  04/02/2019 at 11:58 AM  Between 7am to 6pm - Pager - [phone]  After 6pm go to [email or web address] - password [email or web address] [facility]  Office  [phone]  CC: Primary care physician; Dion Body, MD  Note: This dictation was prepared with Dragon dictation along with smaller phrase technology. Any transcriptional errors that result from this process are unintentional.

## 2019-04-02 NOTE — Consult Note (Signed)
[facility] Psychiatry Consult   Reason for Consult: bipolar disorder Referring Physician: Reatha Harps Pyreddy Patient Identification: [name] MRN:  [id number] Principal Diagnosis: AMS Diagnosis:  Active Problems:   PTSD (post-traumatic stress disorder)   GAD (generalized anxiety disorder)   ARF (acute renal failure) (HCC)   Altered mental status  Total Time spent with patient: 30 minutes  Subjective:   JAELIANA Gray is a 63 y.o. female patient admitted with metabolic encephalopathy.  "I feel lousy, I'm in pain."  Reports feeling down and depressed.  Irritable with answering questions, "I've said this 100 times." " I do not trust you or any other psychiatrist."  Politely let client know that I was a psychiatric nurse practitioner and she responded with "does not matter."  Patient is not interested in therapy or outpatient psychiatry.  She does go to [facility] where, "I do not share one thing with a clientele there. No one has been able to help me am not interested."  Patient seen and evaluated by this provider in person.  Denies suicidal thoughts along with homicidal thoughts and hallucinations.  Admitted for confusion but clear and coherent on assessment today.  She is currently taking Effexor BuSpar Inderal and gabapentin and does not want any medication changes.  Therapy recommended, patient declined.  When asked if we could do anything for her, "nothing, not I think."  Discussed client with her nurse who reports this this thymic irritability with all of her interactions.  Patient does not want any further mental health assistance, recommend she follow-up with [facility].  HPI: Patient is a 63 year old female with a history of anxiety and depression who presents with acute kidney injury, leukocytosis in context of recent surgery.  Patient had a very severe tremor as well as altered mental status.  Yesterday patient was very confused.    Today patient is looking better.  She is less confused.  She is  oriented to person however not to place or time.  She is able to state who is president after prompted.  Patient denies any psychiatric complaints at this time.  Patient denies suicidal ideation or hallucinations.  Patient denies history of hallucinations in the past.  Patient still remains quite confused as to the situation what prompted her admission.  Patient felt that she was at a different hospital and was surprised to learn that she was at [facility].  Patient patient states that she is sleeping fine without incident.  Patient reports being hungry but understands that she needs procedure prior to eating her food.  Patient is unable to explain how she became so confused, rather referring back to that she recently had surgery and is not sure what would have happened.  Patient does seem to be much more oriented today than previously, and is reassured by that fact. Patient reports compliance with psychiatric medications which she states has helped her with her her mood over the years.  She reports seeing a psychiatrist at [facility].  Past Psychiatric History: Patient has a history of anxiety and depression.  Patient and her had bipolar disorder added to her chart.  Looking at chart review there is no evidence of bipolar disorder including diagnosis from recent psychiatric inpatient stay approximately 4 years ago which patient was diagnosed with MDD anxiety and borderline personality disorder.  Also from chart review patient has no history of psychotic symptoms.  Risk to Self:  No Risk to Others:  No  prior Inpatient Therapy:  Yes Prior Outpatient Therapy:  Yes  Past Medical History:  Past Medical History:  Diagnosis Date  . Anxiety   . Anxiety    panic anxiety disorder  . Arthritis   . Asthma    chronic asthmatic bronchitis  . Bipolar disorder ([place])   . Cancer [facility])    Desmoid tumor left forearm  . Depression   . ETOH abuse   . Heart murmur   . Insomnia   . Neuropathy     Past  Surgical History:  Procedure Laterality Date  . ABDOMINAL HYSTERECTOMY    . ABDOMINAL HYSTERECTOMY  2007  . 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 ARM AMPUTATION AT SHOULDER Left   . CESAREAN SECTION    . [place]   x 2  . COLONOSCOPY    . ESOPHAGOGASTRODUODENOSCOPY (EGD) WITH PROPOFOL N/A 04/27/2018   Procedure: ESOPHAGOGASTRODUODENOSCOPY (EGD) WITH PROPOFOL;  Surgeon: Toledo, Benay Pike, MD;  Location: [facility] ENDOSCOPY;  Service: Gastroenterology;  Laterality: N/A;  . JOINT REPLACEMENT Right 2012   hip  . LUMBAR LAMINECTOMY/DECOMPRESSION MICRODISCECTOMY Left 04/16/2016   Procedure: LUMBAR LAMINECTOMY/DECOMPRESSION MICRODISCECTOMY 1 LEVEL;  Surgeon: Meade Maw, MD;  Location: [facility] ORS;  Service: Neurosurgery;  Laterality: Left;  Left L4-5 far lateral discectomy, left L4-5 laminoforaminotomy  . TOTAL HIP ARTHROPLASTY Left 09/17/2015   Procedure: TOTAL HIP ARTHROPLASTY;  Surgeon: Dereck Leep, MD;  Location: [facility] ORS;  Service: Orthopedics;  Laterality: Left;  . TOTAL HIP REVISION Left 03/16/2019   Procedure: TOTAL HIP REVISION ARTHROPLASTY WITH CONVERSION TO A CONSTRAINED LINER.;  Surgeon: Dereck Leep, MD;  Location: [facility] ORS;  Service: Orthopedics;  Laterality: Left;  . WISDOM TOOTH EXTRACTION     Family History:  Family History  Problem Relation Age of Onset  . COPD Mother    Family Psychiatric  History: Patient denies Social History:  Social History   Substance and Sexual Activity  Alcohol Use Yes   Comment: 2 40oz beers daily     Social History   Substance and Sexual Activity  Drug Use No   Comment: Patient denies     Social History   Socioeconomic History  . Marital status: Married    Spouse name: Not on file  . Number of children: Not on file  . Years of education: Not on file  . Highest education level: Not on file  Occupational History  . Not on file  Social Needs  . Financial resource strain: Not on file   . Food insecurity    Worry: Not on file    Inability: Not on file  . Transportation needs    Medical: Not on file    Non-medical: Not on file  Tobacco Use  . Smoking status: Current Every Day Smoker    Packs/day: 0.25    Types: Cigarettes  . Smokeless tobacco: Never Used  Substance and Sexual Activity  . Alcohol use: Yes    Comment: 2 40oz beers daily  . Drug use: No    Comment: Patient denies   . Sexual activity: Yes  Lifestyle  . Physical activity    Days per week: Not on file    Minutes per session: Not on file  . Stress: Not on file  Relationships  . Social Herbalist on phone: Not on file    Gets together: Not on file    Attends religious service: Not on file    Active member of club or organization:  Not on file    Attends meetings of clubs or organizations: Not on file    Relationship status: Not on file  Other Topics Concern  . Not on file  Social History Narrative   ** Merged History Encounter **       Additional Social History: Patient lives at home with by herself.  Has a son who lives in [place].  States that she occasionally drinks denies any recent alcohol use.    Allergies:   Allergies  Allergen Reactions  . Benadryl [Diphenhydramine] Hives and Other (See Comments)    Reaction:  Hyperactivity   . Benadryl [Diphenhydramine] Other (See Comments)    My jaw locked and I could not speak, like a stroke.  . Cephalosporins Hives  . Codeine Nausea And Vomiting    Labs:  Results for orders placed or performed during the hospital encounter of 03/31/19 (from the past 48 hour(s))  Comprehensive metabolic panel     Status: Abnormal   Collection Time: 03/31/19  8:46 PM  Result Value Ref Range   Sodium 136 135 - 145 mmol/L   Potassium 3.1 (L) 3.5 - 5.1 mmol/L   Chloride 103 98 - 111 mmol/L   CO2 16 (L) 22 - 32 mmol/L   Glucose, Bld 63 (L) 70 - 99 mg/dL   BUN 58 (H) 8 - 23 mg/dL   Creatinine, Ser 2.34 (H) 0.44 - 1.00 mg/dL   Calcium 8.6  (L) 8.9 - 10.3 mg/dL   Total Protein 6.7 6.5 - 8.1 g/dL   Albumin 3.5 3.5 - 5.0 g/dL   AST 17 15 - 41 U/L   ALT 6 0 - 44 U/L   Alkaline Phosphatase 70 38 - 126 U/L   Total Bilirubin 2.6 (H) 0.3 - 1.2 mg/dL   GFR calc non Af Amer 21 (L) >60 mL/min   GFR calc Af Amer 25 (L) >60 mL/min   Anion gap 17 (H) 5 - 15    Comment: Performed at [name], [address] [place], [place] [address]  Magnesium     Status: None   Collection Time: 03/31/19  8:46 PM  Result Value Ref Range   Magnesium 1.9 1.7 - 2.4 mg/dL    Comment: Performed at [name], [address] [place], [place] [address]  Phosphorus     Status: None   Collection Time: 03/31/19  8:46 PM  Result Value Ref Range   Phosphorus 4.2 2.5 - 4.6 mg/dL    Comment: Performed at [facility], [address] [place], [place] [address]  CBC     Status: Abnormal   Collection Time: 03/31/19  8:46 PM  Result Value Ref Range   WBC 12.1 (H) 4.0 - 10.5 K/uL   RBC 3.49 (L) 3.87 - 5.11 MIL/uL   Hemoglobin 11.0 (L) 12.0 - 15.0 g/dL   HCT 32.9 (L) 36.0 - 46.0 %   MCV 94.3 80.0 - 100.0 fL   MCH 31.5 26.0 - 34.0 pg   MCHC 33.4 30.0 - 36.0 g/dL   RDW 14.1 11.5 - 15.5 %   Platelets 329 150 - 400 K/uL   nRBC 0.0 0.0 - 0.2 %    Comment: Performed at [facility], [address] [place], [place] [address]  Creatinine, serum     Status: Abnormal   Collection Time: 03/31/19  8:46 PM  Result Value Ref Range   Creatinine, Ser 2.36 (H) 0.44 - 1.00 mg/dL   GFR calc non  Af Amer 21 (L) >60 mL/min   GFR calc Af Amer 24 (L) >60 mL/min    Comment: Performed at [facility], [address] [place], [place] [id number]  Basic metabolic panel     Status: Abnormal   Collection Time: 04/01/19  3:38 AM  Result Value Ref Range   Sodium 138 135 - 145 mmol/L   Potassium 3.0 (L) 3.5 - 5.1 mmol/L   Chloride 106 98 - 111 mmol/L   CO2 18 (L) 22 - 32 mmol/L   Glucose, Bld 61 (L) 70 - 99 mg/dL   BUN 49 (H)  8 - 23 mg/dL   Creatinine, Ser 1.82 (H) 0.44 - 1.00 mg/dL   Calcium 8.7 (L) 8.9 - 10.3 mg/dL   GFR calc non Af Amer 29 (L) >60 mL/min   GFR calc Af Amer 33 (L) >60 mL/min   Anion gap 14 5 - 15    Comment: Performed at [name], [address] [place], [place] [address]  CBC     Status: Abnormal   Collection Time: 04/01/19  3:38 AM  Result Value Ref Range   WBC 9.9 4.0 - 10.5 K/uL   RBC 3.62 (L) 3.87 - 5.11 MIL/uL   Hemoglobin 11.3 (L) 12.0 - 15.0 g/dL   HCT 34.5 (L) 36.0 - 46.0 %   MCV 95.3 80.0 - 100.0 fL   MCH 31.2 26.0 - 34.0 pg   MCHC 32.8 30.0 - 36.0 g/dL   RDW 14.1 11.5 - 15.5 %   Platelets 332 150 - 400 K/uL   nRBC 0.0 0.0 - 0.2 %    Comment: Performed at [facility], [address] [place], [place] [address]  Bilirubin, total     Status: Abnormal   Collection Time: 04/01/19  3:38 AM  Result Value Ref Range   Total Bilirubin 2.6 (H) 0.3 - 1.2 mg/dL    Comment: Performed at [facility], [address] [place], [place] [address]  Comprehensive metabolic panel     Status: Abnormal   Collection Time: 04/02/19  5:32 AM  Result Value Ref Range   Sodium 143 135 - 145 mmol/L   Potassium 3.9 3.5 - 5.1 mmol/L   Chloride 107 98 - 111 mmol/L   CO2 19 (L) 22 - 32 mmol/L   Glucose, Bld 89 70 - 99 mg/dL   BUN 29 (H) 8 - 23 mg/dL   Creatinine, Ser 0.96 0.44 - 1.00 mg/dL   Calcium 8.9 8.9 - 10.3 mg/dL   Total Protein 6.6 6.5 - 8.1 g/dL   Albumin 3.5 3.5 - 5.0 g/dL   AST 17 15 - 41 U/L   ALT 6 0 - 44 U/L   Alkaline Phosphatase 71 38 - 126 U/L   Total Bilirubin 2.0 (H) 0.3 - 1.2 mg/dL   GFR calc non Af Amer >60 >60 mL/min   GFR calc Af Amer >60 >60 mL/min   Anion gap 17 (H) 5 - 15    Comment: Performed at [name], [address] [place], [place] [address]    Current Facility-Administered Medications  Medication Dose Route Frequency Provider Last Rate Last Dose  . 0.9 %  sodium chloride infusion   Intravenous Continuous  Demetrios Loll, MD 75 mL/hr at 04/02/19 0800    . acetaminophen (TYLENOL) tablet 650 mg  650 mg Oral Q6H PRN Demetrios Loll, MD       Or  . acetaminophen (TYLENOL) suppository 650 mg  650 mg  Rectal Q6H PRN Demetrios Loll, MD      . albuterol (PROVENTIL) (2.5 MG/3ML) 0.083% nebulizer solution 2.5 mg  2.5 mg Nebulization Q2H PRN Demetrios Loll, MD      . amantadine (SYMMETREL) capsule 200 mg  200 mg Oral BID Demetrios Loll, MD   200 mg at 04/02/19 0859  . benztropine (COGENTIN) tablet 1 mg  1 mg Oral QHS Demetrios Loll, MD   1 mg at 04/01/19 2331  . bisacodyl (DULCOLAX) EC tablet 5 mg  5 mg Oral Daily PRN Demetrios Loll, MD      . calcium carbonate (TUMS - dosed in mg elemental calcium) chewable tablet 500 mg  500 mg Oral Q breakfast Demetrios Loll, MD   500 mg at 04/02/19 0856  . carbidopa-levodopa (SINEMET IR) 25-100 MG per tablet immediate release 3 tablet  3 tablet Oral TID Demetrios Loll, MD   3 tablet at 04/02/19 0855  . docusate sodium (COLACE) capsule 100 mg  100 mg Oral BID Epifanio Lesches, MD   100 mg at 04/02/19 1419  . folic acid (FOLVITE) tablet 1 mg  1 mg Oral Daily Demetrios Loll, MD   1 mg at 04/02/19 0859  . heparin injection 5,000 Units  5,000 Units Subcutaneous Q8H Demetrios Loll, MD   5,000 Units at 04/02/19 1419  . LORazepam (ATIVAN) tablet 1-4 mg  1-4 mg Oral Q1H PRN Demetrios Loll, MD       Or  . LORazepam (ATIVAN) injection 1-4 mg  1-4 mg Intravenous Q1H PRN Demetrios Loll, MD   2 mg at 04/02/19 0157  . multivitamin with minerals tablet 1 tablet  1 tablet Oral Daily Demetrios Loll, MD   1 tablet at 04/02/19 [phone]  . nicotine (NICODERM CQ - dosed in mg/24 hours) patch 14 mg  14 mg Transdermal Daily Demetrios Loll, MD   14 mg at 04/02/19 0859  . ondansetron (ZOFRAN) tablet 4 mg  4 mg Oral Q6H PRN Demetrios Loll, MD       Or  . ondansetron [name]) injection 4 mg  4 mg Intravenous Q6H PRN Demetrios Loll, MD      . oxyCODONE (Oxy IR/ROXICODONE) immediate release tablet 5 mg  5 mg Oral Q4H PRN Demetrios Loll, MD      . pantoprazole (PROTONIX)  injection 40 mg  40 mg Intravenous Q12H Epifanio Lesches, MD   40 mg at 04/02/19 1420  . phenol (CHLORASEPTIC) mouth spray 1 spray  1 spray Mouth/Throat PRN Demetrios Loll, MD   1 spray at 04/01/19 1756  . potassium chloride SA (KLOR-CON) CR tablet 40 mEq  40 mEq Oral Once Demetrios Loll, MD      . propranolol (INDERAL) tablet 20 mg  20 mg Oral TID Demetrios Loll, MD   20 mg at 04/02/19 0900  . senna-docusate (Senokot-S) tablet 1 tablet  1 tablet Oral QHS PRN Demetrios Loll, MD      . thiamine (VITAMIN B-1) tablet 100 mg  100 mg Oral Daily Demetrios Loll, MD   100 mg at 04/02/19 [id number]   Or  . thiamine (B-1) injection 100 mg  100 mg Intravenous Daily Demetrios Loll, MD      . traZODone (DESYREL) tablet 300 mg  300 mg Oral QHS PRN Demetrios Loll, MD      . venlafaxine XR [facility]) 24 hr capsule 300 mg  300 mg Oral Q breakfast Demetrios Loll, MD   300 mg at 04/02/19 [id number]    Musculoskeletal: Strength & Muscle Tone: decreased Gait &  Station: unable to stand Patient leans: N/A  Psychiatric Specialty Exam: Physical Exam  Nursing note and vitals reviewed. Constitutional: She is oriented to person, place, and time. She appears well-developed and well-nourished.  HENT:  Head: Normocephalic.  Neck: Normal range of motion.  Respiratory: Effort normal.  Neurological: She is alert and oriented to person, place, and time.  Psychiatric: Her speech is normal and behavior is normal. Judgment and thought content normal. Her mood appears anxious. Her affect is blunt. Cognition and memory are normal. She exhibits a depressed mood.    Review of Systems  Constitutional: Negative for diaphoresis and fever.  HENT: Negative for hearing loss.   Skin: Negative for rash.  Psychiatric/Behavioral: Positive for depression. Negative for hallucinations and suicidal ideas. The patient is nervous/anxious.     Blood pressure 133/61, pulse 63, temperature 98.5 F (36.9 C), temperature source Oral, resp. rate 19, SpO2 96 %.There is no height or  weight on file to calculate BMI.  General Appearance: Casual  Eye Contact:  Good  Speech:  Clear and Coherent  Volume:  Normal  Mood: Depression and anxiety  Affect:  Constricted  Thought Process:  Goal Directed  Orientation:  Negative  Thought Content:  Abstract Reasoning  Suicidal Thoughts:  No  Homicidal Thoughts:  No  Memory:  Negative  Judgement:  Fair  Insight:  Fair  Psychomotor Activity:  Tremor  Concentration:  Concentration: Fair  Recall:  [place] of Knowledge:  Fair  Language:  Fair  Akathisia:  No  Handed:  Right  AIMS (if indicated):     Assets:  Desire for Improvement Housing Resilience  ADL's:  Impaired  Cognition:  WDL  Sleep:        Treatment Plan Summary: Daily contact with patient to assess and evaluate symptoms and progress in treatment and Medication management  Started by Dr Tobe Sos, revised by this provider: 63 year old patient with history of anxiety and depression presenting with tremor anxiety hallucinations and altered mental status.  Thankfully altered mental status is improving patient no longer experiencing hallucinations.  Patient currently denying psychiatric symptoms.  Writer feels that bipolar disorder was wrongfully attributed to this patient as chart review revealed no such history of manic or psychotic episodes and recent visit with psychiatrist where MDD was given as a diagnosis.  For that reason, writer feels that MDD is the most appropriate diagnosis at this time.  Patient complaining of depression, no suicidal ideations at this time reflecting that MDD is most likely stable due to medication and outpatient treatment compliance.  Previous change in mental status can most likely be attributed to combination of metabolic encephalopathy versus L-dopa induced.  Diagnosis: Major depressive disorder, stable  Medications: continue home doses of medications:  Venlafaxine 300mg  qam Trazodone 300mg  qhs  Lorazepam used for  CIWA  Disposition: No evidence of imminent risk to self or others at present.   Supportive therapy provided about ongoing stressors.  Waylan Boga, NP 04/02/2019 3:38 PM

## 2019-04-03 ENCOUNTER — Other Ambulatory Visit: Payer: Self-pay | Admitting: Internal Medicine

## 2019-04-03 MED ORDER — PANTOPRAZOLE SODIUM 40 MG PO TBEC
40.0000 mg | DELAYED_RELEASE_TABLET | Freq: Every day | ORAL | Status: DC
  Administered 2019-04-03 – 2019-04-09 (×7): 40 mg via ORAL
  Filled 2019-04-03 (×7): qty 1

## 2019-04-03 MED ORDER — LORAZEPAM 2 MG/ML IJ SOLN
2.0000 mg | Freq: Four times a day (QID) | INTRAMUSCULAR | Status: DC | PRN
  Administered 2019-04-03: 18:00:00 2 mg via INTRAVENOUS

## 2019-04-03 MED ORDER — SODIUM CHLORIDE 0.9 % IV SOLN
INTRAVENOUS | Status: DC | PRN
  Administered 2019-04-03 – 2019-04-04 (×2): 1000 mL via INTRAVENOUS

## 2019-04-03 MED ORDER — SODIUM CHLORIDE 0.9 % IV SOLN
1.0000 g | Freq: Three times a day (TID) | INTRAVENOUS | Status: DC
  Administered 2019-04-03 – 2019-04-04 (×3): 1 g via INTRAVENOUS
  Filled 2019-04-03 (×5): qty 1

## 2019-04-03 NOTE — Progress Notes (Signed)
[facility] [place] NAME: [name]    MR#:  [id number]  DATE OF BIRTH:  [birth year]  SUBJECTIVE: Patient is saying she is depressed, says that her medicines are not given time.  Patient mentioned that sores in the mouth but the nurse said she did not find any ulcers.  Patient appears to be slightly confused  CHIEF COMPLAINT:   Chief Complaint  Patient presents with  . Psychiatric Evaluation  Patient seen and evaluated today Confused on and off.   REVIEW OF SYSTEMS:    ROS  Unable to obtain review of systems because of confusion.  DRUG ALLERGIES:   Allergies  Allergen Reactions  . Benadryl [Diphenhydramine] Hives and Other (See Comments)    Reaction:  Hyperactivity   . Benadryl [Diphenhydramine] Other (See Comments)    My jaw locked and I could not speak, like a stroke.  . Cephalosporins Hives  . Codeine Nausea And Vomiting    VITALS:  Blood pressure 104/60, pulse 63, temperature 98.3 F (36.8 C), temperature source Oral, resp. rate 20, SpO2 96 %.  PHYSICAL EXAMINATION:   Physical Exam  GENERAL:  63 y.o.-year-old patient lying in the bed with no acute distress.  EYES: Pupils equal, round, reactive to light and accommodation. No scleral icterus. Extraocular muscles intact.  HEENT: Head atraumatic, normocephalic. Oropharynx dry and nasopharynx clear.  NECK:  Supple, no jugular venous distention. No thyroid enlargement, no tenderness.  LUNGS: Normal breath sounds bilaterally, no wheezing, rales, rhonchi. No use of accessory muscles of respiration.  CARDIOVASCULAR: S1, S2 normal. No murmurs, rubs, or gallops.  ABDOMEN: Soft, nontender, nondistended. Bowel sounds present. No organomegaly or mass.  EXTREMITIES: No cyanosis, clubbing or edema b/l.    NEUROLOGIC: No gross neurological deficit observed. PSYCHIATRIC: ORIENTED TO PERSON BUT NOT PLACE AND TIME.   SKIN: No obvious rash, lesion, or ulcer.   LABORATORY PANEL:    CBC Recent Labs  Lab 04/01/19 0338  WBC 9.9  HGB 11.3*  HCT 34.5*  PLT 332   ------------------------------------------------------------------------------------------------------------------ Chemistries  Recent Labs  Lab 03/31/19 2046  04/02/19 0532  NA 136   < > 143  K 3.1*   < > 3.9  CL 103   < > 107  CO2 16*   < > 19*  GLUCOSE 63*   < > 89  BUN 58*   < > 29*  CREATININE 2.34*  2.36*   < > 0.96  CALCIUM 8.6*   < > 8.9  MG 1.9  --   --   AST 17  --  17  ALT 6  --  6  ALKPHOS 70  --  71  BILITOT 2.6*   < > 2.0*   < > = values in this interval not displayed.   ------------------------------------------------------------------------------------------------------------------  Cardiac Enzymes No results for input(s): TROPONINI in the last 168 hours. ------------------------------------------------------------------------------------------------------------------  RADIOLOGY:  US Abdomen Limited Ruq  Result Date: 04/01/2019 CLINICAL DATA:  Gallbladder disease EXAM: ULTRASOUND ABDOMEN LIMITED RIGHT UPPER QUADRANT COMPARISON:  None. FINDINGS: Gallbladder: There are gallstones with gallbladder sludge. The gallbladder wall is not thickened. The sonographic Percell Miller sign is reported as negative. Common bile duct: Diameter: 4 mm Liver: No focal lesion identified. Within normal limits in parenchymal echogenicity. Portal vein is patent on color Doppler imaging with normal direction of blood flow towards the liver. Other: The right kidney is somewhat echogenic. IMPRESSION: 1. There is cholelithiasis without secondary signs of acute cholecystitis. 2. Echogenic right  kidney which can be seen in patients with medical renal disease. Electronically Signed   By: Constance Holster M.D.   On: 04/01/2019 18:20     ASSESSMENT AND PLAN:   63 year old female patient with history of anxiety disorder bipolar disorder alcohol abuse, neuropathy currently under hospitalist service  -Acute kidney  injury: Resolving. Increase p.o. intake.  Discontinue IV fluids creatinine trended down from 2.34-0.96.  -Hypokalemia Replace potassium aggressively, potassium improved.  From [id number].  -Acute metabolic encephalopathy improved, has underlying major depression  -Tobacco abuse Tobacco cessation counseled to the patient for 6 minutes Nicotine patch offered  -History of alcohol use CIWA protocol  -Leukocytosis resolved Probably secondary to dehydration, WBC 9.9. Urinalysis did not show any infection Hopefully she can go to [facility].  Text messagedpsychiatric nurse practitioner.  [name] r.  And case discussed with Care Management/Social Worker. Management plans discussed with the patient, family and they are in agreement.  CODE STATUS: Full code  DVT Prophylaxis: SCDs  TOTAL TIME TAKING CARE OF THIS PATIENT: 36 minutes.   POSSIBLE D/C IN 1 to 2 DAYS, DEPENDING ON CLINICAL CONDITION.  Epifanio Lesches M.D on 04/03/2019 at 10:38 AM  Between 7am to 6pm - Pager - [phone]  After 6pm go to [email or web address] - password [email or web address] [facility]  Office  [phone]  CC: Primary care physician; Dion Body, MD  Note: This dictation was prepared with Dragon dictation along with smaller phrase technology. Any transcriptional errors that result from this process are unintentional.

## 2019-04-03 NOTE — Discharge Summary (Signed)
[name], is a 63 y.o. female  DOB Apr 14, 1956  MRN [id number].  Admission date:  03/31/2019  Admitting Physician  Demetrios Loll, MD  Discharge Date:  04/05/2019   Primary MD  Dion Body, MD  Recommendations for primary care physician for things to follow:   Follow with PCP in 1 week   Admission Diagnosis  AKI (acute kidney injury) ([place]) [N17.9] Altered mental status, unspecified altered mental status type [R41.82]   Discharge Diagnosis  AKI (acute kidney injury) ([place]) [N17.9] Altered mental status, unspecified altered mental status type [R41.82]    Active Problems:   PTSD (post-traumatic stress disorder)   GAD (generalized anxiety disorder)   ARF (acute renal failure) ([facility])   Altered mental status      Past Medical History:  Diagnosis Date  . Anxiety   . Anxiety    panic anxiety disorder  . Arthritis   . Asthma    chronic asthmatic bronchitis  . Bipolar disorder ([place])   . Cancer [name])    Desmoid tumor left forearm  . Depression   . ETOH abuse   . Heart murmur   . Insomnia   . Neuropathy     Past Surgical History:  Procedure Laterality Date  . ABDOMINAL HYSTERECTOMY    . ABDOMINAL HYSTERECTOMY  2007  . 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 ARM AMPUTATION AT SHOULDER Left   . CESAREAN SECTION    . [place]   x 2  . COLONOSCOPY    . ESOPHAGOGASTRODUODENOSCOPY (EGD) WITH PROPOFOL N/A 04/27/2018   Procedure: ESOPHAGOGASTRODUODENOSCOPY (EGD) WITH PROPOFOL;  Surgeon: Toledo, Benay Pike, MD;  Location: [facility] ENDOSCOPY;  Service: Gastroenterology;  Laterality: N/A;  . JOINT REPLACEMENT Right 2012   hip  . LUMBAR LAMINECTOMY/DECOMPRESSION MICRODISCECTOMY Left 04/16/2016   Procedure: LUMBAR LAMINECTOMY/DECOMPRESSION MICRODISCECTOMY 1 LEVEL;  Surgeon:  Meade Maw, MD;  Location: [facility] ORS;  Service: Neurosurgery;  Laterality: Left;  Left L4-5 far lateral discectomy, left L4-5 laminoforaminotomy  . TOTAL HIP ARTHROPLASTY Left 09/17/2015   Procedure: TOTAL HIP ARTHROPLASTY;  Surgeon: Dereck Leep, MD;  Location: [facility] ORS;  Service: Orthopedics;  Laterality: Left;  . TOTAL HIP REVISION Left 03/16/2019   Procedure: TOTAL HIP REVISION ARTHROPLASTY WITH CONVERSION TO A CONSTRAINED LINER.;  Surgeon: Dereck Leep, MD;  Location: [facility] ORS;  Service: Orthopedics;  Laterality: Left;  . WISDOM TOOTH EXTRACTION         History of present illness and  Hospital Course:     Kindly see H&P for history of present illness and admission details, please review complete Labs, Consult reports and Test reports for all details in brief  HPI  from the history and physical done on the day of admission 63 year old female with history of anxiety, major depression, alcohol abuse, neuropathy comes in because of altered mental status, admitted to hospitalist service.  Hospital Course  #1.acute kidney injury; secondary to poor p.o. intake, improved with IV hydration, creatinine trended down from 2.34-0.96.  #2 [place] improved with potassium supplements, potassium 3.9 today. 3.  Acute metabolic encephalopathy secondary to dehydration: Patient sepsis work-up is negative, no urine infection.  Patient initially received Cipro in the emergency room.  UA is negative for UTI. 4.  Leukocytosis likely due to dehydration, resolved white count is 9. 5.recent left total hip revision in September 2020 by Dr. Marry Guan.  Left hip appears to be healed well.  Seen by orthopedic PA.. #6 ,  major depression ;noted to be using a lot of Orajel by neighbors, patient is seen by psychiatry, continue venlafaxine 300 mg p.o. in the morning, trazodone 300 mg at night, Psychiatry does not think patient is to be discharged to [facility]. #7 ESBL UTI, received meropenem in the hospital,  discharged with Macrodantin 100 mg p.o. twice daily for 2 weeks.  Patient is maintaining contact isolation in the hospital.   Discharge Condition: Stable   Follow UP  Follow-up Information    Dion Body, MD. Schedule an appointment as soon as possible for a visit in 1 week(s).   Specialty: Family Medicine Contact information: [address] [place] [address] [phone]             Discharge Instructions  and  Discharge Medications   Discharge home with home health.   Allergies as of 04/05/2019      Reactions   Benadryl [diphenhydramine] Hives, Other (See Comments)   Reaction:  Hyperactivity    Benadryl [diphenhydramine] Other (See Comments)   My jaw locked and I could not speak, like a stroke.   Cephalosporins Hives   Codeine Nausea And Vomiting      Medication List    STOP taking these medications   clonazePAM 0.5 MG tablet Commonly known as: KLONOPIN   enoxaparin 40 MG/0.4ML injection Commonly known as: LOVENOX   hydrochlorothiazide 25 MG tablet Commonly known as: HYDRODIURIL     TAKE these medications   acetaminophen 500 MG tablet Commonly known as: TYLENOL Take 1,000 mg by mouth every 6 (six) hours as needed for mild pain or fever.   Amantadine HCl 100 MG tablet Take 200 mg by mouth 2 (two) times daily.   benztropine 1 MG tablet Commonly known as: COGENTIN Take 1 tablet (1 mg total) by mouth at bedtime for 7 days.   calcium carbonate 600 MG Tabs tablet Commonly known as: OS-CAL Take 600 mg by mouth daily with breakfast.   carbidopa-levodopa 25-100 MG tablet Commonly known as: SINEMET IR Take 3 tablets by mouth 3 (three) times daily.   celecoxib 200 MG capsule Commonly known as: CELEBREX Take 1 capsule (200 mg total) by mouth 2 (two) times daily.   cyclobenzaprine 5 MG tablet Commonly known as: FLEXERIL Take 5 mg by mouth 3 (three) times daily as needed for muscle spasms.   gabapentin 300 MG  capsule Commonly known as: NEURONTIN Take 3 capsules (900 mg total) by mouth 3 (three) times daily.   nitrofurantoin (macrocrystal-monohydrate) 100 MG capsule Commonly known as: MACROBID Take 1 capsule (100 mg total) by mouth every 12 (twelve) hours for 14 days.   oxyCODONE 5 MG immediate release tablet Commonly known as: Oxy IR/ROXICODONE Take 1 tablet (5 mg total) by mouth every 4 (four) hours as needed for moderate pain (pain score 4-6).   propranolol 10 MG tablet Commonly known as: INDERAL Take 1 tablet (10 mg total) by mouth 3 (three) times daily. What changed: how much to take   traMADol 50 MG tablet Commonly known as: ULTRAM Take 1-2 tablets (50-100 mg total) by mouth every 4 (four) hours as needed for moderate pain.   traZODone 150 MG tablet Commonly known as: DESYREL Take 300 mg by mouth at bedtime as needed for sleep.   venlafaxine XR 150 MG 24 hr capsule Commonly known as: EFFEXOR-XR Take 2 capsules (300 mg total) by mouth daily with breakfast.         Diet and Activity recommendation: See Discharge Instructions  above   Consults obtained -psychiatry   Major procedures and Radiology Reports - PLEASE review detailed and final reports for all details, in brief -      Ct Head Wo Contrast  Result Date: 03/31/2019 CLINICAL DATA:  Altered level of consciousness. EXAM: CT HEAD WITHOUT CONTRAST TECHNIQUE: Contiguous axial images were obtained from the base of the skull through the vertex without intravenous contrast. COMPARISON:  Head CT 03/28/2019 FINDINGS: Brain: The ventricles are normal in size and configuration. No extra-axial fluid collections are identified. The gray-white differentiation is maintained. No CT findings for acute hemispheric infarction or intracranial hemorrhage. No mass lesions. The brainstem and cerebellum are normal. Vascular: No hyperdense vessels or obvious aneurysm. Skull: No acute skull fracture.  No bone lesion. Sinuses/Orbits: The  paranasal sinuses and mastoid air cells are clear. The globes are intact. Other: No scalp lesions, laceration or hematoma. IMPRESSION: Normal head CT. Electronically Signed   By: Marijo Sanes M.D.   On: 03/31/2019 15:55   Ct Head Wo Contrast  Result Date: 03/28/2019 CLINICAL DATA:  Altered mental status EXAM: CT HEAD WITHOUT CONTRAST TECHNIQUE: Contiguous axial images were obtained from the base of the skull through the vertex without intravenous contrast. COMPARISON:  04/07/2018 FINDINGS: Brain: Mild atrophic changes are noted. No findings to suggest acute hemorrhage, acute infarction or space-occupying mass lesion are noted. Vascular: No hyperdense vessel or unexpected calcification. Skull: Normal. Negative for fracture or focal lesion. Sinuses/Orbits: No acute finding. Other: None. IMPRESSION: Mild atrophic changes without acute abnormality. Electronically Signed   By: Inez Catalina M.D.   On: 03/28/2019 12:24   Dg Chest Portable 1 View  Result Date: 03/28/2019 CLINICAL DATA:  Altered mental status. EXAM: PORTABLE CHEST 1 VIEW COMPARISON:  Single-view of the chest 12/31/2018 and 07/02/2016. FINDINGS: Lung volumes are lower than on the comparison examinations with some crowding of the bronchovascular structures. Peribronchial thickening is noted. No consolidative process, pneumothorax or effusion. Heart size is upper normal. No acute or focal bony abnormality. IMPRESSION: Bronchitic change without focal process. Electronically Signed   By: Inge Rise M.D.   On: 03/28/2019 13:04   Dg Hip Port Unilat With Pelvis 1v Left  Result Date: 03/16/2019 CLINICAL DATA:  Total left hip arthroplasty. EXAM: DG HIP (WITH OR WITHOUT PELVIS) 1V PORT LEFT COMPARISON:  01/31/2019. FINDINGS: Total left hip replacement. Hardware intact. Anatomic alignment. Surgical staples and drainage catheter noted over the left hip. Prior total right hip replacement. No acute bony abnormality. IMPRESSION: Total left hip  replacement.  Hardware intact.  Anatomic alignment. Electronically Signed   By: Marcello Moores  Register   On: 03/16/2019 11:13   US Abdomen Limited Ruq  Result Date: 04/01/2019 CLINICAL DATA:  Gallbladder disease EXAM: ULTRASOUND ABDOMEN LIMITED RIGHT UPPER QUADRANT COMPARISON:  None. FINDINGS: Gallbladder: There are gallstones with gallbladder sludge. The gallbladder wall is not thickened. The sonographic Percell Miller sign is reported as negative. Common bile duct: Diameter: 4 mm Liver: No focal lesion identified. Within normal limits in parenchymal echogenicity. Portal vein is patent on color Doppler imaging with normal direction of blood flow towards the liver. Other: The right kidney is somewhat echogenic. IMPRESSION: 1. There is cholelithiasis without secondary signs of acute cholecystitis. 2. Echogenic right kidney which can be seen in patients with medical renal disease. Electronically Signed   By: Constance Holster M.D.   On: 04/01/2019 18:20    Micro Results     Recent Results (from the past 240 hour(s))  SARS Coronavirus 2 [facility] order,  Performed in [name] hospital lab) Nasopharyngeal Nasopharyngeal Swab     Status: None   Collection Time: 03/28/19  3:07 PM   Specimen: Nasopharyngeal Swab  Result Value Ref Range Status   SARS Coronavirus 2 NEGATIVE NEGATIVE Final    Comment: (NOTE) If result is NEGATIVE SARS-CoV-2 target nucleic acids are NOT DETECTED. The SARS-CoV-2 RNA is generally detectable in upper and lower  respiratory specimens during the acute phase of infection. The lowest  concentration of SARS-CoV-2 viral copies this assay can detect is 250  copies / mL. A negative result does not preclude SARS-CoV-2 infection  and should not be used as the sole basis for treatment or other  patient management decisions.  A negative result may occur with  improper specimen collection / handling, submission of specimen other  than nasopharyngeal swab, presence of viral mutation(s) within  the  areas targeted by this assay, and inadequate number of viral copies  (<250 copies / mL). A negative result must be combined with clinical  observations, patient history, and epidemiological information. If result is POSITIVE SARS-CoV-2 target nucleic acids are DETECTED. The SARS-CoV-2 RNA is generally detectable in upper and lower  respiratory specimens dur ing the acute phase of infection.  Positive  results are indicative of active infection with SARS-CoV-2.  Clinical  correlation with patient history and other diagnostic information is  necessary to determine patient infection status.  Positive results do  not rule out bacterial infection or co-infection with other viruses. If result is PRESUMPTIVE POSTIVE SARS-CoV-2 nucleic acids MAY BE PRESENT.   A presumptive positive result was obtained on the submitted specimen  and confirmed on repeat testing.  While 2019 novel coronavirus  (SARS-CoV-2) nucleic acids may be present in the submitted sample  additional confirmatory testing may be necessary for epidemiological  and / or clinical management purposes  to differentiate between  SARS-CoV-2 and other Sarbecovirus currently known to infect humans.  If clinically indicated additional testing with an alternate test  methodology [phone]) is advised. The SARS-CoV-2 RNA is generally  detectable in upper and lower respiratory sp ecimens during the acute  phase of infection. The expected result is Negative. Fact Sheet for Patients:  StrictlyIdeas.no Fact Sheet for Healthcare Providers: BankingDealers.co.za This test is not yet approved or cleared by the [place] FDA and has been authorized for detection and/or diagnosis of SARS-CoV-2 by FDA under an Emergency Use Authorization (EUA).  This EUA will remain in effect (meaning this test can be used) for the duration of the COVID-19 declaration under Section 564(b)(1) of the Act, 21  U.S.C. section 360bbb-3(b)(1), unless the authorization is terminated or revoked sooner. Performed at [name], [address] [place], [place] [address]   Blood culture (routine x 2)     Status: None   Collection Time: 03/31/19  2:04 PM   Specimen: BLOOD  Result Value Ref Range Status   Specimen Description BLOOD RIGHT ANTECUBITAL  Final   Special Requests   Final    BOTTLES DRAWN AEROBIC AND ANAEROBIC Blood Culture adequate volume   Culture   Final    NO GROWTH 5 DAYS Performed at [name], [address] [place], [place] [address]    Report Status 04/05/2019 FINAL  Final  Blood culture (routine x 2)     Status: None   Collection Time: 03/31/19  2:04 PM   Specimen: BLOOD  Result Value Ref Range Status   Specimen Description BLOOD BLOOD RIGHT HAND  Final  Special Requests   Final    BOTTLES DRAWN AEROBIC AND ANAEROBIC Blood Culture adequate volume   Culture   Final    NO GROWTH 5 DAYS Performed at [facility], [address] [place], [place] [address]    Report Status 04/05/2019 FINAL  Final  Urine Culture     Status: Abnormal   Collection Time: 03/31/19  2:46 PM   Specimen: Urine, Clean Catch  Result Value Ref Range Status   Specimen Description   Final    URINE, CLEAN CATCH Performed at [facility], [address] [place], [place] [address]    Special Requests   Final    Normal Performed at [facility], [address] [place], [place] [address]    Culture (A)  Final    >=100,000 COLONIES/mL ESCHERICHIA COLI Confirmed Extended Spectrum Beta-Lactamase Producer (ESBL).  In bloodstream infections from ESBL organisms, carbapenems are preferred over piperacillin/tazobactam. They are shown to have a lower risk of mortality.    Report Status 04/02/2019 FINAL  Final   Organism ID, Bacteria ESCHERICHIA COLI (A)  Final      Susceptibility   Escherichia coli - MIC*    AMPICILLIN >=32 RESISTANT Resistant      CEFAZOLIN >=64 RESISTANT Resistant     CEFTRIAXONE >=64 RESISTANT Resistant     CIPROFLOXACIN >=4 RESISTANT Resistant     GENTAMICIN >=16 RESISTANT Resistant     IMIPENEM <=0.25 SENSITIVE Sensitive     NITROFURANTOIN <=16 SENSITIVE Sensitive     TRIMETH/SULFA <=20 SENSITIVE Sensitive     AMPICILLIN/SULBACTAM >=32 RESISTANT Resistant     PIP/TAZO 8 SENSITIVE Sensitive     Extended ESBL POSITIVE Resistant     * >=100,000 COLONIES/mL ESCHERICHIA COLI       Today   Subjective:   Ardell Isaacs stable for discharge to [name].  Objective:   Blood pressure (!) 142/70, pulse 61, temperature 98.4 F (36.9 C), temperature source Oral, resp. rate 18, height 5\' 5"  (1.651 m), SpO2 99 %.   Intake/Output Summary (Last 24 hours) at 04/05/2019 1219 Last data filed at 04/05/2019 0100 Gross per 24 hour  Intake 7.51 ml  Output -  Net 7.51 ml    Exam Awake Alert, Oriented x 3, No new F.N deficits, Normal affect [place].AT,PERRAL Supple Neck,No JVD, No cervical lymphadenopathy appriciated.  Symmetrical Chest wall movement, Good air movement bilaterally, CTAB RRR,No Gallops,Rubs or new Murmurs, No Parasternal Heave +ve B.Sounds, Abd Soft, Non tender, No organomegaly appriciated, No rebound -guarding or rigidity. No Cyanosis, Clubbing or edema, No new Rash or bruise  Data Review   CBC w Diff:  Lab Results  Component Value Date   WBC 9.9 04/01/2019   HGB 11.3 (L) 04/01/2019   HGB 14.7 10/11/2014   HCT 34.5 (L) 04/01/2019   HCT 43.7 10/11/2014   PLT 332 04/01/2019   PLT 269 10/11/2014   LYMPHOPCT 21 03/26/2019   MONOPCT 8 03/26/2019   EOSPCT 2 03/26/2019   BASOPCT 1 03/26/2019    CMP:  Lab Results  Component Value Date   NA 143 04/02/2019   NA 139 10/11/2014   K 3.9 04/02/2019   K 4.1 10/11/2014   CL 107 04/02/2019   CL 101 10/11/2014   CO2 19 (L) 04/02/2019   CO2 27 10/11/2014   BUN 29 (H) 04/02/2019   BUN 14 10/11/2014   CREATININE 0.96 04/02/2019   CREATININE 0.71  10/11/2014   PROT 6.6 04/02/2019   PROT 7.7 10/11/2014  ALBUMIN 3.5 04/02/2019   ALBUMIN 4.3 10/11/2014   BILITOT 2.0 (H) 04/02/2019   BILITOT 0.5 10/11/2014   ALKPHOS 71 04/02/2019   ALKPHOS 66 10/11/2014   AST 17 04/02/2019   AST 29 10/11/2014   ALT 6 04/02/2019   ALT 18 10/11/2014  .   Total Time in preparing paper work, data evaluation and todays exam - 35 minutes  Epifanio Lesches M.D on 04/05/2019 at 12:19 PM    Note: This dictation was prepared with Dragon dictation along with smaller phrase technology. Any transcriptional errors that result from this process are unintentional.

## 2019-04-03 NOTE — Consult Note (Signed)
Pharmacy Antibiotic Note  [name] is a 63 y.o. female admitted on 03/31/2019 with hallucinations/AMS. Patient admitted forsuspected UTI.  Ucx shows >100,000 ESBL E. Coli.  Pharmacy has been consulted for Meropenem dosing.  Plan: Start meropenem 1g IV every 8 hours     Temp (24hrs), Avg:98.5 F (36.9 C), Min:98.3 F (36.8 C), Max:98.8 F (37.1 C)  Recent Labs  Lab 03/28/19 1044 03/31/19 1446 03/31/19 2046 04/01/19 0338 04/02/19 0532  WBC 14.2* 15.9* 12.1* 9.9  --   CREATININE 1.10* 2.86* 2.34*  2.36* 1.82* 0.96    Estimated Creatinine Clearance: 55.6 mL/min (by C-G formula based on SCr of 0.96 mg/dL).    Allergies  Allergen Reactions  . Benadryl [Diphenhydramine] Hives and Other (See Comments)    Reaction:  Hyperactivity   . Benadryl [Diphenhydramine] Other (See Comments)    My jaw locked and I could not speak, like a stroke.  . Cephalosporins Hives  . Codeine Nausea And Vomiting    Antimicrobials this admission: 10/8 Cipro >> x1 10/11 Meropenem>.    Microbiology results: 10/8 UCx: >100,000 ESBL E. Coli   Thank you for allowing pharmacy to be a part of this patient's care.  Pernell Dupre, PharmD, BCPS Clinical Pharmacist 04/03/2019 11:33 AM

## 2019-04-03 NOTE — Progress Notes (Signed)
If patient stabilizes medically and meets criteria for psychiatric inpatient hospitalization, please notify [phone] or the provider on call for a bed.  Waylan Boga, [facility] NP

## 2019-04-03 NOTE — Evaluation (Signed)
Physical Therapy Evaluation Patient Details Name: [name] MRN: [id number] DOB: 07/17/1955 Today's Date: 04/03/2019   History of Present Illness  Per MD H&P: Pt is a 63 y.o. female with a known history that includes anxiety, asthma, bipolar disorder, cancer, LUE amputation, ETOH abuse, insomnia, neuropathy, and recent L THA revision.  The patient presents to the ED with Hallucination, tremors and altered mental status.  The patient was found by neighbors drinking a bottle of oral gel due to tooth pain this morning.  Pt is found with acute renal failure, suspected to have UTI and given Cipro in the ED.  MD assessment includes: AKI, hypokalemia, Acute metabolic encephalopathy, h/o alcohol use on CIWA, and leukocytosis.  Clinical Impression   Pt presented with deficits in strength, transfers, mobility, gait, balance, and activity tolerance but overall performed well during the session.  Pt was Mod Ind with bed mobility tasks and CGA with transfers.  Pt was able to amb 2 x 30' with a loftstrand crutch and CGA with min instability that the pt was able to self-correct.  LLE posterior hip precaution education/review provided to patient and nursing with pt recalling 0/3 precautions.  Cues given during functional mobility training to ensure compliance with precautions during the session.  Pt will benefit from HHPT services upon discharge to safely address above deficits for decreased caregiver assistance and eventual return to PLOF.      Follow Up Recommendations Home health PT;Supervision/Assistance - 24 hour    Equipment Recommendations  None recommended by PT    Recommendations for Other Services       Precautions / Restrictions Precautions Precautions: Posterior Hip Precaution Booklet Issued: Yes (comment) Restrictions LLE Weight Bearing: Weight bearing as tolerated Other Position/Activity Restrictions: Hip precautions/WB status taken from chart review from recent admission for L THA  revision.      Mobility  Bed Mobility Overal bed mobility: Modified Independent             General bed mobility comments: Extra time and effort but no physical assistance required  Transfers Overall transfer level: Needs assistance Equipment used: Lofstrands Transfers: Sit to/from Stand Sit to Stand: Min guard         General transfer comment: Good eccentric and concentric control during transfers from an elevated EOB and with min verbal cues for sequencing to ensure L post hip precaution compliance  Ambulation/Gait Ambulation/Gait assistance: Min guard Gait Distance (Feet): 30 Feet Assistive device: Lofstrands Gait Pattern/deviations: Step-through pattern;Drifts right/left Gait velocity: Decreased   General Gait Details: Min instability with mild drifting left/right but pt able to self-correct without assistance  Stairs            Wheelchair Mobility    Modified Rankin (Stroke Patients Only)       Balance Overall balance assessment: Needs assistance Sitting-balance support: Feet supported;No upper extremity supported Sitting balance-Leahy Scale: Good     Standing balance support: Single extremity supported Standing balance-Leahy Scale: Fair Standing balance comment: Pt able to maintain static standing balance with one loftstrand crutch without physical assistance                             Pertinent Vitals/Pain Pain Assessment: No/denies pain    Home Living Family/patient expects to be discharged to:: Private residence Living Arrangements: Alone Available Help at Discharge: Friend(s);Available PRN/intermittently;Personal care attendant Type of Home: Apartment Home Access: Stairs to enter Entrance Stairs-Rails: Right;Left;Can reach both Entrance Stairs-Number of Steps:  3 Home Layout: One level Home Equipment: Bedside commode;Other (comment);Shower seat;Wheelchair - manual Additional Comments: Loftstrand crutches    Prior  Function Level of Independence: Needs assistance   Gait / Transfers Assistance Needed: Pt Mod Ind with Amb with loftstrand crutch with 5 falls in the last year mostly secondary to L hip instability, Ind with bed mobility and transfers  ADL's / Homemaking Assistance Needed: Pt has a PCA 2.5 hrs/day, 7 days/wk who assists as needed including with housework, bathing, and dressing        Hand Dominance        Extremity/Trunk Assessment   Upper Extremity Assessment Upper Extremity Assessment: Overall WFL for tasks assessed LUE Deficits / Details: hx of LUE amputation.    Lower Extremity Assessment Lower Extremity Assessment: Generalized weakness       Communication   Communication: No difficulties  Cognition Arousal/Alertness: Awake/alert Behavior During Therapy: WFL for tasks assessed/performed Overall Cognitive Status: No family/caregiver present to determine baseline cognitive functioning                         Following Commands: Follows one step commands consistently              General Comments      Exercises Total Joint Exercises Ankle Circles/Pumps: Strengthening;AROM;Both;15 reps Quad Sets: Strengthening;Both;15 reps Gluteal Sets: Strengthening;Both;15 reps Hip ABduction/ADduction: AROM;Both;10 reps Straight Leg Raises: AROM;Both;10 reps Long Arc Quad: AROM;Both;10 reps Other Exercises Other Exercises: Posterior hip precaution education/review with pt Other Exercises: 90 deg left turn training x 2 to prevent CKC L hip IR Other Exercises: Nursing educated on posterior hip precaution status   Assessment/Plan    PT Assessment Patient needs continued PT services  PT Problem List Decreased strength;Decreased activity tolerance;Decreased balance;Decreased mobility;Decreased knowledge of precautions       PT Treatment Interventions DME instruction;Gait training;Stair training;Functional mobility training;Therapeutic activities;Therapeutic  exercise;Balance training;Patient/family education    PT Goals (Current goals can be found in the Care Plan section)  Acute Rehab PT Goals Patient Stated Goal: To walk better PT Goal Formulation: With patient Time For Goal Achievement: 04/16/19 Potential to Achieve Goals: Good    Frequency Min 2X/week   Barriers to discharge        Co-evaluation               AM-PAC PT "6 Clicks" Mobility  Outcome Measure Help needed turning from your back to your side while in a flat bed without using bedrails?: None Help needed moving from lying on your back to sitting on the side of a flat bed without using bedrails?: None Help needed moving to and from a bed to a chair (including a wheelchair)?: A Little Help needed standing up from a chair using your arms (e.g., wheelchair or bedside chair)?: A Little Help needed to walk in hospital room?: A Little Help needed climbing 3-5 steps with a railing? : A Little 6 Click Score: 20    End of Session Equipment Utilized During Treatment: Gait belt Activity Tolerance: Patient tolerated treatment well Patient left: in bed;with bed alarm set;with call bell/phone within reach;Other (comment)(abd pillows in place) Nurse Communication: Mobility status;Other (comment)(posterior hip precautions) PT Visit Diagnosis: Unsteadiness on feet (R26.81);History of falling (Z91.81);Other abnormalities of gait and mobility (R26.89);Muscle weakness (generalized) (M62.81)    Time: 1100-1134 PT Time Calculation (min) (ACUTE ONLY): 34 min   Charges:   PT Evaluation $PT Eval Moderate Complexity: 1 Mod PT Treatments $Therapeutic Exercise: 8-22 mins $  Therapeutic Activity: 8-22 mins        D. Scott Ciarah Peace PT, DPT 04/03/19, 12:03 PM

## 2019-04-04 MED ORDER — NITROFURANTOIN MONOHYD MACRO 100 MG PO CAPS
100.0000 mg | ORAL_CAPSULE | Freq: Two times a day (BID) | ORAL | Status: DC
  Administered 2019-04-04 – 2019-04-09 (×11): 100 mg via ORAL
  Filled 2019-04-04 (×14): qty 1

## 2019-04-04 MED ORDER — SENNOSIDES-DOCUSATE SODIUM 8.6-50 MG PO TABS
1.0000 | ORAL_TABLET | Freq: Two times a day (BID) | ORAL | Status: DC
  Administered 2019-04-04 – 2019-04-09 (×11): 1 via ORAL
  Filled 2019-04-04 (×11): qty 1

## 2019-04-04 NOTE — Progress Notes (Signed)
[facility] [place] NAME: [name]    MR#:  [id number]  DATE OF BIRTH:  03/02/1956  \Confused, agitated yesterday, sitter at bedside.  Started on meropenem for ESBL UTI  CHIEF COMPLAINT:   Chief Complaint  Patient presents with  . Psychiatric Evaluation  Patient seen and evaluated today Confused on and off.   REVIEW OF SYSTEMS:    ROS  Unable to obtain review of systems because of confusion.  DRUG ALLERGIES:   Allergies  Allergen Reactions  . Benadryl [Diphenhydramine] Hives and Other (See Comments)    Reaction:  Hyperactivity   . Benadryl [Diphenhydramine] Other (See Comments)    My jaw locked and I could not speak, like a stroke.  . Cephalosporins Hives  . Codeine Nausea And Vomiting    VITALS:  Blood pressure (!) 119/50, pulse 69, temperature 99.6 F (37.6 C), temperature source Oral, resp. rate 16, SpO2 93 %.  PHYSICAL EXAMINATION:   Physical Exam  GENERAL:  63 y.o.-year-old patient lying in the bed with no acute distress.  EYES: Pupils equal, round, reactive to light and accommodation. No scleral icterus. Extraocular muscles intact.  HEENT: Head atraumatic, normocephalic. Oropharynx dry and nasopharynx clear.  NECK:  Supple, no jugular venous distention. No thyroid enlargement, no tenderness.  LUNGS: Normal breath sounds bilaterally, no wheezing, rales, rhonchi. No use of accessory muscles of respiration.  CARDIOVASCULAR: S1, S2 normal. No murmurs, rubs, or gallops.  ABDOMEN: Soft, nontender, nondistended. Bowel sounds present. No organomegaly or mass.  EXTREMITIES: No cyanosis, clubbing or edema b/l.    NEUROLOGIC: No gross neurological deficit observed. PSYCHIATRIC: ORIENTED TO PERSON BUT NOT PLACE AND TIME.   SKIN: No obvious rash, lesion, or ulcer.   LABORATORY PANEL:   CBC Recent Labs  Lab 04/01/19 0338  WBC 9.9  HGB 11.3*  HCT 34.5*  PLT 332    ------------------------------------------------------------------------------------------------------------------ Chemistries  Recent Labs  Lab 03/31/19 2046  04/02/19 0532  NA 136   < > 143  K 3.1*   < > 3.9  CL 103   < > 107  CO2 16*   < > 19*  GLUCOSE 63*   < > 89  BUN 58*   < > 29*  CREATININE 2.34*  2.36*   < > 0.96  CALCIUM 8.6*   < > 8.9  MG 1.9  --   --   AST 17  --  17  ALT 6  --  6  ALKPHOS 70  --  71  BILITOT 2.6*   < > 2.0*   < > = values in this interval not displayed.   ------------------------------------------------------------------------------------------------------------------  Cardiac Enzymes No results for input(s): TROPONINI in the last 168 hours. ------------------------------------------------------------------------------------------------------------------  RADIOLOGY:  No results found.   ASSESSMENT AND PLAN:   63 year old female patient with history of anxiety disorder bipolar disorder alcohol abuse, neuropathy currently under hospitalist service  -Acute kidney injury: Resolved Increase p.o. intake.  Discontinue IV fluids creatinine trended down from 2.34-0.96.  -Hypokalemia Replace potassium aggressively, potassium improved.  From [id number].  -Acute metabolic encephalopathy improved, has underlying major depression  -Tobacco abuse Tobacco cessation counseled to the patient for 6 minutes Nicotine patch offered  -History of alcohol use CIWA protocol Agitated yesterday, sitter at bedside, continue sitter.. -Leukocytosis resolved Probably secondary to dehydration, WBC 9.9. ESBL UTI, started on meropenem, can change to Macrodantin.due to low-grade temperature observe him medically and today hopefully she can go to [employer].    Marland Kitchen  And case discussed with Care Management/Social Worker. Management plans discussed with the patient, family and they are in agreement.  CODE STATUS: Full code  DVT Prophylaxis: SCDs  TOTAL TIME  TAKING CARE OF THIS PATIENT: 36 minutes.   POSSIBLE D/C IN 1 to 2 DAYS, DEPENDING ON CLINICAL CONDITION.  Epifanio Lesches M.D on 04/04/2019 at 11:59 AM  Between 7am to 6pm - Pager - [phone]  After 6pm go to [email or web address] - password [email or web address] [facility]  Office  [phone]  CC: Primary care physician; Dion Body, MD  Note: This dictation was prepared with Dragon dictation along with smaller phrase technology. Any transcriptional errors that result from this process are unintentional.

## 2019-04-04 NOTE — Progress Notes (Signed)
Patient became nauseous and vomited after morning meds. Patient may need PRN nausea medication prior to med administration in the future. Vital signs stable. Will continue to monitor.  Marry Guan, RN 04/04/2019 6:33 PM

## 2019-04-04 NOTE — Progress Notes (Signed)
Physical Therapy Treatment Patient Details Name: [name] MRN: [id number] DOB: [birth year] Today's Date: 04/04/2019    History of Present Illness Per MD H&P: Pt is a 63 y.o. female with a known history that includes anxiety, asthma, bipolar disorder, cancer, LUE amputation, ETOH abuse, insomnia, neuropathy, and recent L THA revision.  The patient presents to the ED with Hallucination, tremors and altered mental status.  The patient was found by neighbors drinking a bottle of oral gel due to tooth pain this morning.  Pt is found with acute renal failure, suspected to have UTI and given Cipro in the ED.  MD assessment includes: AKI, hypokalemia, Acute metabolic encephalopathy, h/o alcohol use on CIWA, and leukocytosis.    PT Comments    Pt presented with deficits in strength, transfers, mobility, gait, balance, and activity tolerance but is slowly progressing towards goals and presented with decreased anxiety grossly this session.  Pt was Mod Ind with bed mobility tasks and CGA with transfers with cues for proper sequencing for hip precaution compliance.  Pt was able to amb 30' x 2 with only min drifting that the pt was easily able to self-correct.  Pt participated with static and dynamic standing balance training without UE support with fair to good stability throughout.  Pt will benefit from HHPT services upon discharge to safely address above deficits for decreased caregiver assistance and eventual return to PLOF.     Follow Up Recommendations  Home health PT;Supervision/Assistance - 24 hour     Equipment Recommendations  None recommended by PT    Recommendations for Other Services       Precautions / Restrictions Precautions Precautions: Posterior Hip Precaution Booklet Issued: Yes (comment) Restrictions Weight Bearing Restrictions: No LLE Weight Bearing: Weight bearing as tolerated Other Position/Activity Restrictions: Hip precautions/WB status taken from chart review from  recent admission for L THA revision.    Mobility  Bed Mobility Overal bed mobility: Modified Independent Bed Mobility: Supine to Sit;Sit to Supine     Supine to sit: Modified independent (Device/Increase time) Sit to supine: Modified independent (Device/Increase time)   General bed mobility comments: Good speed and effort with bed mobility tasks this session with use of the bed rail  Transfers Overall transfer level: Needs assistance Equipment used: Lofstrands Transfers: Sit to/from Stand Sit to Stand: Min guard         General transfer comment: Good eccentric and concentric control during transfers from an elevated EOB and with min verbal cues for sequencing to ensure L post hip precaution compliance  Ambulation/Gait Ambulation/Gait assistance: Min guard Gait Distance (Feet): 30 Feet x 2 Assistive device: Lofstrands Gait Pattern/deviations: Step-through pattern;Drifts right/left Gait velocity: Decreased   General Gait Details: Mild drifting left/right but pt able to self-correct without assistance   Stairs             Wheelchair Mobility    Modified Rankin (Stroke Patients Only)       Balance Overall balance assessment: Needs assistance Sitting-balance support: Feet supported;No upper extremity supported Sitting balance-Leahy Scale: Good     Standing balance support: No upper extremity supported;Single extremity supported Standing balance-Leahy Scale: Fair Standing balance comment: Pt steady with static and dynamic standing balance including reaching outside BOS                            Cognition Arousal/Alertness: Awake/alert Behavior During Therapy: WFL for tasks assessed/performed Overall Cognitive Status: No family/caregiver present to determine baseline  cognitive functioning                         Following Commands: Follows one step commands consistently       General Comments: Pt presented with only mild anxiety  during the session, followed commands well, and actively participated throughout the session      Exercises Total Joint Exercises Ankle Circles/Pumps: Strengthening;AROM;Both;10 reps Quad Sets: Strengthening;Both;10 reps Gluteal Sets: Strengthening;Both;10 reps Towel Squeeze: Strengthening;Both;10 reps Heel Slides: AROM;Right;10 reps Hip ABduction/ADduction: AROM;Both;10 reps Long Arc Quad: AROM;Both;10 reps Knee Flexion: AROM;Strengthening;Both;10 reps Marching in Standing: AROM;Standing;Both;10 reps Other Exercises: Static and dynamic standing balance training without UE support including reaching outside BOS Other Exercises: Posterior hip precaution education/review with pt and CNA sitter in room with pt recalling 0/3 precautions    General Comments        Pertinent Vitals/Pain Pain Assessment: No/denies pain Pain Score: 5  Pain Location: L hip Pain Descriptors / Indicators: Aching;Sore Pain Intervention(s): Monitored during session;Premedicated before session    Home Living                      Prior Function            PT Goals (current goals can now be found in the care plan section) Progress towards PT goals: Progressing toward goals    Frequency    Min 2X/week      PT Plan Current plan remains appropriate    Co-evaluation              AM-PAC PT "6 Clicks" Mobility   Outcome Measure  Help needed turning from your back to your side while in a flat bed without using bedrails?: None Help needed moving from lying on your back to sitting on the side of a flat bed without using bedrails?: None Help needed moving to and from a bed to a chair (including a wheelchair)?: A Little Help needed standing up from a chair using your arms (e.g., wheelchair or bedside chair)?: A Little Help needed to walk in hospital room?: A Little Help needed climbing 3-5 steps with a railing? : A Little 6 Click Score: 20    End of Session Equipment Utilized During  Treatment: Gait belt Activity Tolerance: Patient tolerated treatment well Patient left: in bed;with call bell/phone within reach;with nursing/sitter in room;Other (comment)(abd pillows in place) Nurse Communication: Mobility status;Other (comment)(CNA/sitter educated on post hip precaution set up with abd pillows) PT Visit Diagnosis: Unsteadiness on feet (R26.81);History of falling (Z91.81);Other abnormalities of gait and mobility (R26.89);Muscle weakness (generalized) (M62.81)     Time: [id number] PT Time Calculation (min) (ACUTE ONLY): 25 min  Charges:  $Gait Training: 8-22 mins $Therapeutic Exercise: 8-22 mins                     D. Scott Demarco Bacci PT, DPT 04/04/19, 4:11 PM

## 2019-04-05 LAB — CULTURE, BLOOD (ROUTINE X 2)
Culture: NO GROWTH
Culture: NO GROWTH
Special Requests: ADEQUATE
Special Requests: ADEQUATE

## 2019-04-05 LAB — GLUCOSE, CAPILLARY: Glucose-Capillary: 84 mg/dL (ref 70–99)

## 2019-04-05 MED ORDER — NITROFURANTOIN MONOHYD MACRO 100 MG PO CAPS: 100.0000 mg | ORAL_CAPSULE | Freq: Two times a day (BID) | ORAL | 0 refills | Status: AC

## 2019-04-05 MED ORDER — HYDROXYZINE HCL 10 MG PO TABS
10.0000 mg | ORAL_TABLET | Freq: Three times a day (TID) | ORAL | Status: DC | PRN
  Administered 2019-04-05 – 2019-04-06 (×2): 10 mg via ORAL
  Filled 2019-04-05 (×3): qty 1

## 2019-04-05 NOTE — Consult Note (Signed)
[facility] Consult   Reason for Consult: bipolar disorder Referring Physician: Pavan Pyreddy Patient Identification: [name] MRN:  [id number] Principal Diagnosis: <principal problem not specified> Diagnosis:  Active Problems:   PTSD (post-traumatic stress disorder)   GAD (generalized anxiety disorder)   ARF (acute renal failure) ([place])   Altered mental status   Total Time spent with patient: 45 minutes     04/05/2019: Patient reassessed today.  Patient corroborates the information below is accurate.  Patient at this time has psychiatric care in the outpatient setting.  Patient does not meet criteria for psychiatric hospitalization.  Clear for discharge from a psychiatric perspective.  Subjective:   [name] is a 63 y.o. female patient admitted with metabolic encephalopathy.  HPI: Patient is a 63 year old female with a history of anxiety and depression who presents with acute kidney injury, leukocytosis in context of recent surgery.  Patient had a very severe tremor as well as altered mental status.  Yesterday patient was very confused.  Today patient is looking better.  She is less confused.  She is oriented to person however not to place or time.  She is able to state who is president after prompted.  Patient denies any psychiatric complaints at this time.  Patient denies suicidal ideation or hallucinations.  Patient denies history of hallucinations in the past.  Patient still remains quite confused as to the situation what prompted her admission.  Patient felt that she was at a different hospital and was surprised to learn that she was at [facility].  Patient patient states that she is sleeping fine without incident.  Patient reports being hungry but understands that she needs procedure prior to eating her food.  Patient is unable to explain how she became so confused, rather referring back to that she recently had surgery and is not sure what  would have happened.  Patient does seem to be much more oriented today than previously, and is reassured by that fact. Patient reports compliance with psychiatric medications which she states has helped her with her her mood over the years.  She reports seeing a psychiatrist at [facility].  Past Psychiatric History: Patient has a history of anxiety and depression.  Patient and her had bipolar disorder added to her chart.  Looking at chart review there is no evidence of bipolar disorder including diagnosis from recent psychiatric inpatient stay approximately 4 years ago which patient was diagnosed with MDD anxiety and borderline personality disorder.  Also from chart review patient has no history of psychotic symptoms.  Risk to Self:  No Risk to Others:  No  prior Inpatient Therapy:  Yes Prior Outpatient Therapy:  Yes  Past Medical History:  Past Medical History:  Diagnosis Date  . Anxiety   . Anxiety    panic anxiety disorder  . Arthritis   . Asthma    chronic asthmatic bronchitis  . Bipolar disorder ([place])   . Cancer [facility])    Desmoid tumor left forearm  . Depression   . ETOH abuse   . Heart murmur   . Insomnia   . Neuropathy     Past Surgical History:  Procedure Laterality Date  . ABDOMINAL HYSTERECTOMY    . ABDOMINAL HYSTERECTOMY  2007  . 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 ARM AMPUTATION AT SHOULDER Left   . CESAREAN SECTION    . [place]   x 2  . COLONOSCOPY    .  ESOPHAGOGASTRODUODENOSCOPY (EGD) WITH PROPOFOL N/A 04/27/2018   Procedure: ESOPHAGOGASTRODUODENOSCOPY (EGD) WITH PROPOFOL;  Surgeon: Toledo, Benay Pike, MD;  Location: [facility] ENDOSCOPY;  Service: Gastroenterology;  Laterality: N/A;  . JOINT REPLACEMENT Right 2012   hip  . LUMBAR LAMINECTOMY/DECOMPRESSION MICRODISCECTOMY Left 04/16/2016   Procedure: LUMBAR LAMINECTOMY/DECOMPRESSION MICRODISCECTOMY 1 LEVEL;  Surgeon: Meade Maw, MD;  Location: [facility] ORS;  Service:  Neurosurgery;  Laterality: Left;  Left L4-5 far lateral discectomy, left L4-5 laminoforaminotomy  . TOTAL HIP ARTHROPLASTY Left 09/17/2015   Procedure: TOTAL HIP ARTHROPLASTY;  Surgeon: Dereck Leep, MD;  Location: [facility] ORS;  Service: Orthopedics;  Laterality: Left;  . TOTAL HIP REVISION Left 03/16/2019   Procedure: TOTAL HIP REVISION ARTHROPLASTY WITH CONVERSION TO A CONSTRAINED LINER.;  Surgeon: Dereck Leep, MD;  Location: [facility] ORS;  Service: Orthopedics;  Laterality: Left;  . WISDOM TOOTH EXTRACTION     Family History:  Family History  Problem Relation Age of Onset  . COPD Mother    Family Psychiatric  History: Patient denies Social History:  Social History   Substance and Sexual Activity  Alcohol Use Yes   Comment: 2 40oz beers daily     Social History   Substance and Sexual Activity  Drug Use No   Comment: Patient denies     Social History   Socioeconomic History  . Marital status: Married    Spouse name: Not on file  . Number of children: Not on file  . Years of education: Not on file  . Highest education level: Not on file  Occupational History  . Not on file  Social Needs  . Financial resource strain: Not on file  . Food insecurity    Worry: Not on file    Inability: Not on file  . Transportation needs    Medical: Not on file    Non-medical: Not on file  Tobacco Use  . Smoking status: Current Every Day Smoker    Packs/day: 0.25    Types: Cigarettes  . Smokeless tobacco: Never Used  Substance and Sexual Activity  . Alcohol use: Yes    Comment: 2 40oz beers daily  . Drug use: No    Comment: Patient denies   . Sexual activity: Yes  Lifestyle  . Physical activity    Days per week: Not on file    Minutes per session: Not on file  . Stress: Not on file  Relationships  . Social Herbalist on phone: Not on file    Gets together: Not on file    Attends religious service: Not on file    Active member of club or organization: Not on file     Attends meetings of clubs or organizations: Not on file    Relationship status: Not on file  Other Topics Concern  . Not on file  Social History Narrative   ** Merged History Encounter **       Additional Social History: Patient lives at home with by herself.  Has a son who lives in [place].  States that she occasionally drinks denies any recent alcohol use.    Allergies:   Allergies  Allergen Reactions  . Benadryl [Diphenhydramine] Hives and Other (See Comments)    Reaction:  Hyperactivity   . Benadryl [Diphenhydramine] Other (See Comments)    My jaw locked and I could not speak, like a stroke.  . Cephalosporins Hives  . Codeine Nausea And Vomiting    Labs:  Results for orders placed  or performed during the hospital encounter of 03/31/19 (from the past 48 hour(s))  Glucose, capillary     Status: None   Collection Time: 04/05/19  5:05 PM  Result Value Ref Range   Glucose-Capillary 84 70 - 99 mg/dL   Comment 1 Notify RN     Current Facility-Administered Medications  Medication Dose Route Frequency Provider Last Rate Last Dose  . 0.9 %  sodium chloride infusion   Intravenous PRN Epifanio Lesches, MD   Stopped at 04/04/19 0700  . acetaminophen (TYLENOL) tablet 650 mg  650 mg Oral Q6H PRN Demetrios Loll, MD       Or  . acetaminophen (TYLENOL) suppository 650 mg  650 mg Rectal Q6H PRN Demetrios Loll, MD      . albuterol (PROVENTIL) (2.5 MG/3ML) 0.083% nebulizer solution 2.5 mg  2.5 mg Nebulization Q2H PRN Demetrios Loll, MD      . amantadine (SYMMETREL) capsule 200 mg  200 mg Oral BID Demetrios Loll, MD   200 mg at 04/05/19 0957  . benztropine (COGENTIN) tablet 1 mg  1 mg Oral QHS Demetrios Loll, MD   1 mg at 04/04/19 2230  . bisacodyl (DULCOLAX) EC tablet 5 mg  5 mg Oral Daily PRN Demetrios Loll, MD      . calcium carbonate (TUMS - dosed in mg elemental calcium) chewable tablet 500 mg  500 mg Oral Q breakfast Demetrios Loll, MD   500 mg at 04/05/19 0949  . carbidopa-levodopa (SINEMET IR) 25-100  MG per tablet immediate release 3 tablet  3 tablet Oral TID Demetrios Loll, MD   3 tablet at 04/05/19 1703  . folic acid (FOLVITE) tablet 1 mg  1 mg Oral Daily Demetrios Loll, MD   1 mg at 04/05/19 0952  . heparin injection 5,000 Units  5,000 Units Subcutaneous Q8H Demetrios Loll, MD   5,000 Units at 04/05/19 1348  . hydrOXYzine (ATARAX/VISTARIL) tablet 10 mg  10 mg Oral TID PRN Lang Snow, NP   10 mg at 04/05/19 0600  . LORazepam (ATIVAN) injection 2 mg  2 mg Intravenous Q6H PRN Epifanio Lesches, MD   2 mg at 04/03/19 1804  . multivitamin with minerals tablet 1 tablet  1 tablet Oral Daily Demetrios Loll, MD   1 tablet at 04/05/19 [phone]  . nicotine (NICODERM CQ - dosed in mg/24 hours) patch 14 mg  14 mg Transdermal Daily Demetrios Loll, MD   14 mg at 04/05/19 0946  . nitrofurantoin (macrocrystal-monohydrate) (MACROBID) capsule 100 mg  100 mg Oral Q12H Epifanio Lesches, MD   100 mg at 04/05/19 1141  . ondansetron (ZOFRAN) tablet 4 mg  4 mg Oral Q6H PRN Demetrios Loll, MD       Or  . ondansetron [facility]) injection 4 mg  4 mg Intravenous Q6H PRN Demetrios Loll, MD      . oxyCODONE (Oxy IR/ROXICODONE) immediate release tablet 5 mg  5 mg Oral Q4H PRN Demetrios Loll, MD      . pantoprazole (PROTONIX) EC tablet 40 mg  40 mg Oral Daily Epifanio Lesches, MD   40 mg at 04/05/19 0958  . phenol (CHLORASEPTIC) mouth spray 1 spray  1 spray Mouth/Throat PRN Demetrios Loll, MD   1 spray at 04/03/19 2055  . potassium chloride SA (KLOR-CON) CR tablet 40 mEq  40 mEq Oral Once Demetrios Loll, MD      . propranolol (INDERAL) tablet 20 mg  20 mg Oral TID Demetrios Loll, MD   20 mg at 04/05/19 1735  .  senna-docusate (Senokot-S) tablet 1 tablet  1 tablet Oral BID Epifanio Lesches, MD   1 tablet at 04/05/19 0952  . thiamine (VITAMIN B-1) tablet 100 mg  100 mg Oral Daily Demetrios Loll, MD   100 mg at 04/05/19 0945  . traZODone (DESYREL) tablet 300 mg  300 mg Oral QHS PRN Demetrios Loll, MD   300 mg at 04/03/19 2053  . venlafaxine XR (EFFEXOR-XR)  24 hr capsule 300 mg  300 mg Oral Q breakfast Demetrios Loll, MD   300 mg at 04/05/19 [id number]    Musculoskeletal: Strength & Muscle Tone: decreased Gait & Station: unable to stand Patient leans: N/A  Psychiatric Specialty Exam: Physical Exam  Review of Systems  Constitutional: Negative for diaphoresis and fever.  HENT: Negative for hearing loss.   Skin: Negative for rash.  Psychiatric/Behavioral: Negative for depression, hallucinations and suicidal ideas. The patient is nervous/anxious.     Blood pressure (!) 133/55, pulse 62, temperature 98.3 F (36.8 C), temperature source Oral, resp. rate 17, height 5\' 5"  (1.651 m), SpO2 97 %.Body mass index is 24.46 kg/m.  General Appearance: Casual  Eye Contact:  Good  Speech:  Clear and Coherent  Volume:  Normal  Mood:  Euthymic  Affect:  Constricted  Thought Process:  Goal Directed  Orientation:  Negative  Thought Content:  Abstract Reasoning  Suicidal Thoughts:  No  Homicidal Thoughts:  No  Memory:  Negative  Judgement:  Fair  Insight:  Fair  Psychomotor Activity:  Tremor  Concentration:  Concentration: Fair  Recall:  [place] of Knowledge:  Fair  Language:  Fair  Akathisia:  No  Handed:  Right  AIMS (if indicated):     Assets:  Desire for Improvement Housing Resilience  ADL's:  Impaired  Cognition:  Impaired,  Mild  Sleep:        Treatment Plan Summary: Daily contact with patient to assess and evaluate symptoms and progress in treatment and Medication management   63 year old patient with history of anxiety and depression presenting with tremor anxiety hallucinations and altered mental status.  Thankfully altered mental status is improving patient no longer experiencing hallucinations.  Patient currently denying psychiatric symptoms.  Although patient is still somewhat confused.  Writer feels that bipolar disorder was wrongfully attributed to this patient as chart review revealed no such history of manic or psychotic episodes  and recent visit with psychiatrist where MDD was given as a diagnosis.  For that reason, writer feels that MDD is the most appropriate diagnosis at this time.  Patient also not complaining of depression or suicidal ideations at this time reflecting that MDD is most likely stable due to medication and outpatient treatment compliance.  Previous change in mental status can most likely be attributed to combination of metabolic encephalopathy versus L-dopa induced.   Diagnosis: Major depressive disorder, stable  Medications: continue home doses of medications:  Venlafaxine 300mg  qam Trazodone 300mg  qhs       Disposition: No evidence of imminent risk to self or others at present.   Patient does not meet criteria for psychiatric inpatient admission. Supportive therapy provided about ongoing stressors.  Dixie Dials, MD 04/05/2019 6:04 PM

## 2019-04-05 NOTE — Progress Notes (Signed)
Physical Therapy Treatment Patient Details Name: [name] MRN: [id number] DOB: June 07, 1956 Today's Date: 04/05/2019    History of Present Illness Per MD H&P: Pt is a 63 y.o. female with a known history that includes anxiety, asthma, bipolar disorder, cancer, LUE amputation, ETOH abuse, insomnia, neuropathy, and recent L THA revision.  The patient presents to the ED with Hallucination, tremors and altered mental status.  The patient was found by neighbors drinking a bottle of oral gel due to tooth pain this morning.  Pt is found with acute renal failure, suspected to have UTI and given Cipro in the ED.  MD assessment includes: AKI, hypokalemia, Acute metabolic encephalopathy, h/o alcohol use on CIWA, and leukocytosis.    PT Comments    Pt presented with deficits in strength, transfers, mobility, gait, balance, and activity tolerance but continues to progress towards goals.  Pt was Ind with bed mobility tasks and SBA with transfers demonstrating good control and stability.  Pt was able to amb 2 x 60' with SBA/CGA with improved stability demonstrated by decreased drifting left/right compared to the previous session.  Pt required mod encouragement to increase amb distance and tends to want to sit with any sign of fatigue.  With encouragement pt doubled the distance where she originally wanted to stop with ease and no adverse symptoms.  Pt education provided on physiological benefits of activity/activty progression.  Pt continues to demonstrate poor recall regarding hip precautions and will benefit from HHPT services upon discharge with 24/hr assist to safely address above deficits for decreased caregiver assistance and eventual return to PLOF.     Follow Up Recommendations  Home health PT;Supervision/Assistance - 24 hour     Equipment Recommendations  None recommended by PT    Recommendations for Other Services       Precautions / Restrictions Precautions Precautions: Posterior  Hip Precaution Booklet Issued: Yes (comment) Restrictions Weight Bearing Restrictions: Yes LLE Weight Bearing: Weight bearing as tolerated Other Position/Activity Restrictions: Hip precautions/WB status taken from chart review from recent admission for L THA revision.    Mobility  Bed Mobility Overal bed mobility: Independent             General bed mobility comments: Good speed and effort with bed mobility tasks this session with use of the bed rail not required  Transfers Overall transfer level: Needs assistance Equipment used: Lofstrands Transfers: Sit to/from Stand Sit to Stand: Supervision;From elevated surface         General transfer comment: Good eccentric and concentric control during transfers from an elevated EOB and with min verbal cues for sequencing to ensure L post hip precaution compliance  Ambulation/Gait Ambulation/Gait assistance: Min guard;Supervision Gait Distance (Feet): 60 Feet x 2 Assistive device: Lofstrands Gait Pattern/deviations: Step-through pattern;Decreased step length - right;Decreased step length - left Gait velocity: Decreased   General Gait Details: Improved cadence and stability compared to previous session with no left/right drifting noted and no LOB; practice with 90 deg L turns to prevent CKC hip IR   Stairs             Wheelchair Mobility    Modified Rankin (Stroke Patients Only)       Balance Overall balance assessment: Needs assistance Sitting-balance support: Feet supported;No upper extremity supported Sitting balance-Leahy Scale: Normal     Standing balance support: Single extremity supported Standing balance-Leahy Scale: Good  Cognition Arousal/Alertness: Awake/alert Behavior During Therapy: WFL for tasks assessed/performed Overall Cognitive Status: No family/caregiver present to determine baseline cognitive functioning                                  General Comments: Pt remains somewhat anxious during the session but continues to improve grossly      Exercises Total Joint Exercises Ankle Circles/Pumps: Strengthening;AROM;Both;10 reps Quad Sets: Strengthening;Both;10 reps Gluteal Sets: Strengthening;Both;10 reps Towel Squeeze: Strengthening;Both;10 reps Heel Slides: AROM;Right;10 reps Hip ABduction/ADduction: AROM;Both;10 reps Long Arc Quad: AROM;Both;10 reps;Strengthening Knee Flexion: AROM;Strengthening;Both;10 reps Other Exercises Other Exercises: Posterior hip precaution education/review with pt with pt recalling 0/3 precautions Other Exercises: Pt education provided on physiological benefits of activity and principles of activity progression    General Comments        Pertinent Vitals/Pain Pain Assessment: No/denies pain    Home Living                      Prior Function            PT Goals (current goals can now be found in the care plan section) Progress towards PT goals: Progressing toward goals    Frequency    Min 2X/week      PT Plan Current plan remains appropriate    Co-evaluation              AM-PAC PT "6 Clicks" Mobility   Outcome Measure  Help needed turning from your back to your side while in a flat bed without using bedrails?: None Help needed moving from lying on your back to sitting on the side of a flat bed without using bedrails?: None Help needed moving to and from a bed to a chair (including a wheelchair)?: A Little Help needed standing up from a chair using your arms (e.g., wheelchair or bedside chair)?: A Little Help needed to walk in hospital room?: A Little Help needed climbing 3-5 steps with a railing? : A Little 6 Click Score: 20    End of Session Equipment Utilized During Treatment: Gait belt Activity Tolerance: Patient tolerated treatment well Patient left: in bed;with call bell/phone within reach;Other (comment);with bed alarm set(abd pillows in place; pt  declined up in chair) Nurse Communication: Mobility status PT Visit Diagnosis: Unsteadiness on feet (R26.81);History of falling (Z91.81);Other abnormalities of gait and mobility (R26.89);Muscle weakness (generalized) (M62.81)     Time: [id number] PT Time Calculation (min) (ACUTE ONLY): 26 min  Charges:  $Gait Training: 8-22 mins $Therapeutic Exercise: 8-22 mins                     D. Scott Mohd Clemons PT, DPT 04/05/19, 3:49 PM

## 2019-04-05 NOTE — Progress Notes (Signed)
Patient is s/p left total hip revision arthroplasty performed by Dr. Marry Guan on 03/16/19.  Staples were removed in the ED upon admission due to altered mental status.  Patient with dry dressing applied to the left hip, no draining noted.  No longer need to have dressings to the left hip.  Follow-up as scheduled with [place] [facility] [place] after discharge.  Raquel James, PA-C [place]

## 2019-04-05 NOTE — Progress Notes (Signed)
MD notified: I walked with the patient she required some assistance getting out of bed, she walked about 20 ft using her personal cane she brought from home. She said she felt like her legs were going to give out and her legs would jerk as she ambulated.

## 2019-04-06 NOTE — Progress Notes (Signed)
[facility] [place] NAME: [name]    MR#:  [id number]  DATE OF BIRTH:  1955/08/01  Patient is seen, denies any complaints.  CHIEF COMPLAINT:   Chief Complaint  Patient presents with  . Psychiatric Evaluation  Patient seen and evaluated today Confused on and off.   REVIEW OF SYSTEMS:    Review of Systems  Constitutional: Negative for chills and fever.  HENT: Negative for hearing loss.   Eyes: Negative for blurred vision, double vision and photophobia.  Respiratory: Negative for cough, hemoptysis and shortness of breath.   Cardiovascular: Negative for palpitations, orthopnea and leg swelling.  Gastrointestinal: Negative for abdominal pain, diarrhea and vomiting.  Genitourinary: Negative for dysuria and urgency.  Musculoskeletal: Negative for myalgias and neck pain.  Skin: Negative for rash.  Neurological: Negative for dizziness, focal weakness, seizures, weakness and headaches.  Psychiatric/Behavioral: Negative for memory loss. The patient does not have insomnia.     Unable to obtain review of systems because of confusion.  DRUG ALLERGIES:   Allergies  Allergen Reactions  . Benadryl [Diphenhydramine] Hives and Other (See Comments)    Reaction:  Hyperactivity   . Benadryl [Diphenhydramine] Other (See Comments)    My jaw locked and I could not speak, like a stroke.  . Cephalosporins Hives  . Codeine Nausea And Vomiting    VITALS:  Blood pressure (!) 145/68, pulse 66, temperature 98.2 F (36.8 C), temperature source Oral, resp. rate 20, height 5\' 5"  (1.651 m), SpO2 94 %.  PHYSICAL EXAMINATION:   Physical Exam  GENERAL:  63 y.o.-year-old patient lying in the bed with no acute distress.  EYES: Pupils equal, round, reactive to light and accommodation. No scleral icterus. Extraocular muscles intact.  HEENT: Head atraumatic, normocephalic. Oropharynx dry and nasopharynx clear.  NECK:  Supple, no jugular venous distention. No  thyroid enlargement, no tenderness.  LUNGS: Normal breath sounds bilaterally, no wheezing, rales, rhonchi. No use of accessory muscles of respiration.  CARDIOVASCULAR: S1, S2 normal. No murmurs, rubs, or gallops.  ABDOMEN: Soft, nontender, nondistended. Bowel sounds present. No organomegaly or mass.  EXTREMITIES: No cyanosis, clubbing or edema b/l.    NEUROLOGIC: No gross neurological deficit observed. PSYCHIATRIC: ORIENTED TO PERSON BUT NOT PLACE AND TIME.   SKIN: No obvious rash, lesion, or ulcer.   LABORATORY PANEL:   CBC Recent Labs  Lab 04/01/19 0338  WBC 9.9  HGB 11.3*  HCT 34.5*  PLT 332   ------------------------------------------------------------------------------------------------------------------ Chemistries  Recent Labs  Lab 03/31/19 2046  04/02/19 0532  NA 136   < > 143  K 3.1*   < > 3.9  CL 103   < > 107  CO2 16*   < > 19*  GLUCOSE 63*   < > 89  BUN 58*   < > 29*  CREATININE 2.34*  2.36*   < > 0.96  CALCIUM 8.6*   < > 8.9  MG 1.9  --   --   AST 17  --  17  ALT 6  --  6  ALKPHOS 70  --  71  BILITOT 2.6*   < > 2.0*   < > = values in this interval not displayed.   ------------------------------------------------------------------------------------------------------------------  Cardiac Enzymes No results for input(s): TROPONINI in the last 168 hours. ------------------------------------------------------------------------------------------------------------------  RADIOLOGY:  No results found.   ASSESSMENT AND PLAN:   63 year old female patient with history of anxiety disorder bipolar disorder alcohol abuse, neuropathy currently under hospitalist service  -  Acute kidney injury: Resolved Increase p.o. intake.  Discontinue IV fluids creatinine trended down from 2.34-0.96.  -Hypokalemia Replace potassium aggressively, potassium improved.  From [id number].  -Acute metabolic encephalopathy improved, has underlying major depression  -Tobacco  abuse Tobacco cessation counseled to the patient for 6 minutes Nicotine patch offered  -History of alcohol use CIWA protocol Agitated yesterday, sitter at bedside, continue sitter.. -Leukocytosis resolved ESBL UTI, continue Macrodantin.    Psychiatry seen the patient because of her depression did not feel like patient needs inpatient psych admission..     And case discussed with Care Management/Social Worker. Management plans discussed with the patient, family and they are in agreement.  CODE STATUS: Full code  DVT Prophylaxis: SCDs  TOTAL TIME TAKING CARE OF THIS PATIENT: 36 minutes.   POSSIBLE D/C IN 1 to 2 DAYS, DEPENDING ON CLINICAL CONDITION.  Epifanio Lesches M.D on 04/06/2019 at 1:26 PM  Between 7am to 6pm - Pager - [phone]  After 6pm go to [email or web address] - password [email or web address] [facility]  Office  [phone]  CC: Primary care physician; Dion Body, MD  Note: This dictation was prepared with Dragon dictation along with smaller phrase technology. Any transcriptional errors that result from this process are unintentional.

## 2019-04-06 NOTE — Evaluation (Signed)
Occupational Therapy Evaluation Patient Details Name: [name] MRN: [id number] DOB: [birth year] Today's Date: 04/06/2019    History of Present Illness Per MD H&P: Pt is a 63 y.o. female with a known history that includes anxiety, asthma, bipolar disorder, cancer, LUE amputation, ETOH abuse, insomnia, neuropathy, and recent L THA revision.  The patient presents to the ED with Hallucination, tremors and altered mental status.  The patient was found by neighbors drinking a bottle of oral gel due to tooth pain this morning.  Pt is found with acute renal failure, suspected to have UTI and given Cipro in the ED.  MD assessment includes: AKI, hypokalemia, Acute metabolic encephalopathy, h/o alcohol use on CIWA, and leukocytosis.   Clinical Impression   Pt seen for OT evaluation this date. Prior to hospital admission, pt required assistance in BADL mgt 2/2 recent posterior hip replacement.  Pt lives alone in a 1 level apartment.  Currently pt demonstrates impairments in strength, balance, coordination and safety awareness with limited recall and adherence to posterior hip precautions during OT evaluation. Pt reports she is a habitual leg-crosser and requires modeling and multimodal prompting for adherence to precautions during STS t/fs. Pt continues to require min to moderate assist for LB ADL mgt and education on how to adheren to hip precautions during functional activity.  Pt would benefit from skilled OT to address noted impairments and functional limitations (see below for any additional details) in order to maximize safety and independence while minimizing falls risk and caregiver burden.  Upon hospital discharge, recommend pt discharge to STR to maximize pt safety and return to PLOF.     Follow Up Recommendations  SNF    Equipment Recommendations  None recommended by OT(Pt has necessary equipment)    Recommendations for Other Services       Precautions / Restrictions  Precautions Precautions: Posterior Hip Restrictions Weight Bearing Restrictions: Yes LLE Weight Bearing: Weight bearing as tolerated Other Position/Activity Restrictions: Hip precautions/WB status taken from chart review from recent admission for L THA revision.      Mobility Bed Mobility Overal bed mobility: Independent Bed Mobility: Supine to Sit;Sit to Supine     Supine to sit: Modified independent (Device/Increase time) Sit to supine: Modified independent (Device/Increase time)   General bed mobility comments: Cueing for posterior hip precuations requiring when coming to sit at EOB (low bed)  Transfers Overall transfer level: Needs assistance Equipment used: Lofstrands Transfers: Sit to/from Stand Sit to Stand: Supervision;Min guard         General transfer comment: bed lower today due to bed control falling off.    Balance Overall balance assessment: Needs assistance Sitting-balance support: Feet supported Sitting balance-Leahy Scale: Good Sitting balance - Comments: Pt steady sitting, reaching within BOS. Demonstrates good static and dynamic sitting balance during use of BSC. Able to weight shift to complete peri-care independently   Standing balance support: Single extremity supported Standing balance-Leahy Scale: Fair Standing balance comment: Pt demonstrates decreased balance today with several imbalance with close min gaurd/assist.  Pt reports being fearful of falling which causes her to limit gait.                           ADL either performed or assessed with clinical judgement   ADL Overall ADL's : Needs assistance/impaired     Grooming: Set up;Sitting   Upper Body Bathing: Sitting;Minimal assistance;Min guard Upper Body Bathing Details (indicate cue type and reason): Pt requires min  assist to don shampoo cap this date. Otherwise is able to manage 1-handed hair washing w/o assist. Lower Body Bathing: Set up;Minimal assistance;Moderate  assistance;Sitting/lateral leans;Cueing for safety;Adhering to hip precautions       Lower Body Dressing: Minimal assistance;Moderate assistance;Sit to/from stand;Cueing for safety;Adhering to hip precautions   Toilet Transfer: Set up;BSC;Ambulation;Min guard;Supervision/safety Toilet Transfer Details (indicate cue type and reason): Pt ambulates to [name] over room commode this date using lofstrand crutch and supervision to min guard for safety/stability. Toileting- Clothing Manipulation and Hygiene: Set up;Sit to/from stand;Adhering to hip precautions;Supervision/safety;Cueing for safety       Functional mobility during ADLs: Min guard(With loftstrand crutch) General ADL Comments: Pt has limited recall of posterior hip precautions and requires consistent cueing t/o OT session for adherence.     Vision Baseline Vision/History: Wears glasses Wears Glasses: At all times Patient Visual Report: No change from baseline       Perception     Praxis      Pertinent Vitals/Pain Pain Score: 3  Pain Location: L hip Pain Descriptors / Indicators: Aching;Sore Pain Intervention(s): Limited activity within patient's tolerance;Monitored during session;Repositioned     Hand Dominance Right   Extremity/Trunk Assessment Upper Extremity Assessment Upper Extremity Assessment: Overall WFL for tasks assessed LUE Deficits / Details: hx of LUE amputation.   Lower Extremity Assessment Lower Extremity Assessment: Generalized weakness LLE Deficits / Details: s/p L total hip revision.       Communication Communication Communication: No difficulties   Cognition Arousal/Alertness: Awake/alert Behavior During Therapy: WFL for tasks assessed/performed;Anxious Overall Cognitive Status: No family/caregiver present to determine baseline cognitive functioning Area of Impairment: Following commands                       Following Commands: Follows one step commands consistently;Follows  multi-step commands inconsistently Safety/Judgement: Decreased awareness of safety;Decreased awareness of deficits     General Comments: Pt has difficulty adherening to posterior hip precautions, and tends to demonstrated limited overall safety awareness during functional transfers this date. Demonstrates improved overall cognition since start of admission.   General Comments       Exercises Other Exercises Other Exercises: Posterior hip precaution education/review with pt with pt recalling 1/3 precautions independently at start of session. 2/3 independently at end of session with Min cueing for recall of 3rd. Other Exercises: Pt assisted with use of BSC and UB bathing (hairwashing) this date.   Shoulder Instructions      Home Living Family/patient expects to be discharged to:: Private residence Living Arrangements: Alone Available Help at Discharge: Friend(s);Available PRN/intermittently;Personal care attendant Type of Home: Apartment Home Access: Stairs to enter Entrance Stairs-Number of Steps: 3 Entrance Stairs-Rails: Right;Left;Can reach both Home Layout: One level     Bathroom Shower/Tub: Teacher, early years/pre: Handicapped height Bathroom Accessibility: Yes   Home Equipment: Bedside commode;Other (comment);Shower seat;Wheelchair - manual   Additional Comments: Loftstrand crutches      Prior Functioning/Environment Level of Independence: Needs assistance  Gait / Transfers Assistance Needed: Pt Mod Ind with Amb with loftstrand crutch with 5 falls in the last year mostly secondary to L hip instability, Ind with bed mobility and transfers ADL's / Homemaking Assistance Needed: Pt has a PCA 2.5 hrs/day, 7 days/wk who assists as needed including with housework, bathing, and dressing   Comments: uses loft stand crutch recently, previously indep        OT Problem List: Decreased strength;Decreased coordination;Pain;Decreased range of motion;Decreased activity  tolerance;Decreased safety  awareness;Impaired balance (sitting and/or standing);Decreased knowledge of precautions;Impaired UE functional use;Decreased knowledge of use of DME or AE      OT Treatment/Interventions: Self-care/ADL training;Balance training;Therapeutic exercise;Therapeutic activities;Energy conservation;DME and/or AE instruction;Patient/family education    OT Goals(Current goals can be found in the care plan section) Acute Rehab OT Goals Patient Stated Goal: To walk better OT Goal Formulation: With patient Time For Goal Achievement: 04/20/19 Potential to Achieve Goals: Good  OT Frequency: Min 2X/week   Barriers to D/C: Inaccessible home environment;Decreased caregiver support          Co-evaluation              AM-PAC OT "6 Clicks" Daily Activity     Outcome Measure Help from another person eating meals?: A Little Help from another person taking care of personal grooming?: A Little Help from another person toileting, which includes using toliet, bedpan, or urinal?: A Little Help from another person bathing (including washing, rinsing, drying)?: A Lot Help from another person to put on and taking off regular upper body clothing?: A Little Help from another person to put on and taking off regular lower body clothing?: A Lot 6 Click Score: 16   End of Session Equipment Utilized During Treatment: Gait belt;Other (comment)(loftstrand) Nurse Communication: Mobility status;Other (comment)  Activity Tolerance: Patient tolerated treatment well Patient left: in chair;with call bell/phone within reach;with chair alarm set;with SCD's reapplied  OT Visit Diagnosis: Other abnormalities of gait and mobility (R26.89);Repeated falls (R29.6);Pain Pain - Right/Left: Left Pain - part of body: Hip                Time: 1331-1355 OT Time Calculation (min): 24 min Charges:  OT General Charges $OT Visit: 1 Visit OT Evaluation $OT Eval Moderate Complexity: 1 Mod OT  Treatments $Self Care/Home Management : 8-22 mins  Shara Blazing, M.S., OTR/L Ascom: [phone] 04/06/19, 3:52 PM

## 2019-04-06 NOTE — Progress Notes (Signed)
[place] at [place] NAME: [name]    MR#:  [id number]  DATE OF BIRTH:  1956/03/01  Physical therapy recommends skilled nursing.  CHIEF COMPLAINT:   Chief Complaint  Patient presents with  . Psychiatric Evaluation  Patient seen and evaluated today Confused on and off.   REVIEW OF SYSTEMS:    Review of Systems  Constitutional: Negative for chills and fever.  HENT: Negative for hearing loss.   Eyes: Negative for blurred vision, double vision and photophobia.  Respiratory: Negative for cough, hemoptysis and shortness of breath.   Cardiovascular: Negative for palpitations, orthopnea and leg swelling.  Gastrointestinal: Negative for abdominal pain, diarrhea and vomiting.  Genitourinary: Negative for dysuria and urgency.  Musculoskeletal: Negative for myalgias and neck pain.  Skin: Negative for rash.  Neurological: Negative for dizziness, focal weakness, seizures, weakness and headaches.  Psychiatric/Behavioral: Negative for memory loss. The patient does not have insomnia.     Patient alert, awake, oriented.  Says that she lives alone and feels very shaky and unstable and does not feel like she can take care of herself at home.  DRUG ALLERGIES:   Allergies  Allergen Reactions  . Benadryl [Diphenhydramine] Hives and Other (See Comments)    Reaction:  Hyperactivity   . Benadryl [Diphenhydramine] Other (See Comments)    My jaw locked and I could not speak, like a stroke.  . Cephalosporins Hives  . Codeine Nausea And Vomiting    VITALS:  Blood pressure (!) 145/68, pulse 66, temperature 98.2 F (36.8 C), temperature source Oral, resp. rate 20, height 5\' 5"  (1.651 m), SpO2 94 %.  PHYSICAL EXAMINATION:   Physical Exam  GENERAL:  63 y.o.-year-old patient lying in the bed with no acute distress.  EYES: Pupils equal, round, reactive to light and accommodation. No scleral icterus. Extraocular muscles intact.  HEENT: Head atraumatic,  normocephalic. Oropharynx dry and nasopharynx clear.  NECK:  Supple, no jugular venous distention. No thyroid enlargement, no tenderness.  LUNGS: Normal breath sounds bilaterally, no wheezing, rales, rhonchi. No use of accessory muscles of respiration.  CARDIOVASCULAR: S1, S2 normal. No murmurs, rubs, or gallops.  ABDOMEN: Soft, nontender, nondistended. Bowel sounds present. No organomegaly or mass.  EXTREMITIES: No cyanosis, clubbing or edema b/l.    NEUROLOGIC: No gross neurological deficit observed. PSYCHIATRIC: ORIENTED TO PERSON BUT NOT PLACE AND TIME.   SKIN: No obvious rash, lesion, or ulcer.   LABORATORY PANEL:   CBC Recent Labs  Lab 04/01/19 0338  WBC 9.9  HGB 11.3*  HCT 34.5*  PLT 332   ------------------------------------------------------------------------------------------------------------------ Chemistries  Recent Labs  Lab 03/31/19 2046  04/02/19 0532  NA 136   < > 143  K 3.1*   < > 3.9  CL 103   < > 107  CO2 16*   < > 19*  GLUCOSE 63*   < > 89  BUN 58*   < > 29*  CREATININE 2.34*  2.36*   < > 0.96  CALCIUM 8.6*   < > 8.9  MG 1.9  --   --   AST 17  --  17  ALT 6  --  6  ALKPHOS 70  --  71  BILITOT 2.6*   < > 2.0*   < > = values in this interval not displayed.   ------------------------------------------------------------------------------------------------------------------  Cardiac Enzymes No results for input(s): TROPONINI in the last 168 hours. ------------------------------------------------------------------------------------------------------------------  RADIOLOGY:  No results found.   ASSESSMENT AND PLAN:  63 year old female patient with history of anxiety disorder bipolar disorder alcohol abuse, neuropathy currently under hospitalist service  -Acute kidney injury: Resolved Increase p.o. intake.  Discontinue IV fluids creatinine trended down from 2.34-0.96.  -Hypokalemia Replace potassium aggressively, potassium improved.  From  [id number].  -Acute metabolic encephalopathy improved, has underlying major depression  -Tobacco abuse Tobacco cessation counseled to the patient for 6 minutes Nicotine patch offered  -History of alcohol use CIWA protocol  ESBL UTI, continue Macrodantin Generalized weakness, ambulatory dysfunction, physical therapy recommends skilled nursing facility.  Patient medically stable when the arrangements are made.    .  And case discussed with Care Management/Social Worker. Management plans discussed with the patient, family and they are in agreement.  CODE STATUS: Full code  DVT Prophylaxis: SCDs  TOTAL TIME TAKING CARE OF THIS PATIENT: 36 minutes.   POSSIBLE D/C IN 1 to 2 DAYS, DEPENDING ON CLINICAL CONDITION. More than 50% time patient document patient is also to the nurse case manager. Epifanio Lesches M.D on 04/06/2019 at 1:24 PM  Between 7am to 6pm - Pager - [phone]  After 6pm go to [email or web address] - password [email or web address] [facility]  Office  [phone]  CC: Primary care physician; Dion Body, MD  Note: This dictation was prepared with Dragon dictation along with smaller phrase technology. Any transcriptional errors that result from this process are unintentional.

## 2019-04-06 NOTE — NC FL2 (Signed)
[place] LEVEL OF CARE SCREENING TOOL     IDENTIFICATION  Patient Name: [name] Birthdate: [birth year] Sex: female Admission Date (Current Location): 03/31/2019  [place] and [place] Number:  Selena Lesser [id number] [place] and Address:  [name], [address] [place], [place] [address]      Provider Number: [id number]  Attending Physician Name and Address:  Epifanio Lesches, MD  Relative Name and Phone Number:  Romie Jumper   [id number] or [name], [name]   [phone]    Current Level of Care: Hospital Recommended Level of Care: [place] Prior Approval Number:    Date Approved/Denied:   PASRR Number: [id number]  Discharge Plan: SNF    Current Diagnoses: Patient Active Problem List   Diagnosis Date Noted  . Altered mental status   . ARF (acute renal failure) ([place]) 03/31/2019  . Back pain 03/16/2019  . Constipation 03/16/2019  . Parkinson disease ([place]) 03/16/2019  . Murmur 03/16/2019  . Neck pain 03/16/2019  . 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 Essential hypertension 02/22/2019  . At high risk for falls 01/25/2019  . Drug-induced tremor 01/25/2019  . Essential tremor 01/25/2019  . 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 Displacement of internal left hip prosthesis ([place]) 11/29/2018  . Hip dislocation, left ([place]) 11/29/2018  . Hip dislocation, left, sequela 08/06/2018  . High anion gap metabolic acidosis 99991111  . History of alcohol abuse 10/09/2016  . Neuropathy of left upper extremity 10/09/2016  . Urinary incontinence, mixed 10/09/2016  . Overdose of antidepressant 07/03/2016  . 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 Alcohol use disorder, severe, dependence ([place]) 01/26/2015  . Tobacco use disorder 01/26/2015  . Nicotine dependence, uncomplicated 123XX123  . Alcohol withdrawal ([place])  01/08/2015  . 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 Suicidal ideation 12/19/2014  . Alcohol use 10/30/2014  . Anxiety 10/30/2014  . Depression 10/30/2014  . GAD (generalized anxiety disorder) 12/24/2013  . PTSD (post-traumatic stress disorder) 12/22/2013    Orientation RESPIRATION BLADDER Height & Weight     Self, Time, Situation, Place  Normal Continent Weight:   Height:  5\' 5"  (165.1 cm)  BEHAVIORAL SYMPTOMS/MOOD NEUROLOGICAL BOWEL NUTRITION STATUS      Continent Diet(Carb modified)  AMBULATORY STATUS COMMUNICATION OF NEEDS Skin   Limited Assist Verbally Surgical wounds                       Personal Care Assistance Level of Assistance  Dressing, Feeding, Bathing Bathing Assistance: Limited assistance Feeding assistance: Independent Dressing Assistance: Limited assistance     Functional Limitations Info  Sight, Hearing, Speech Sight Info: Adequate Hearing Info: Adequate Speech Info: Adequate    SPECIAL CARE FACTORS FREQUENCY  PT (By licensed PT), OT (By licensed OT)     PT Frequency: Minimum 5x a week OT Frequency: Minimum 5x a week            Contractures Contractures Info: Not present    Additional Factors Info  Code Status, Allergies, Psychotropic Code Status Info: Full Code Allergies Info: Cephalosporins Codeine Benadryl Psychotropic Info: propranolol (INDERAL) tablet 20 mg and venlafaxine XR (EFFEXOR-XR) 24 hr capsule 300 mg         Current Medications (04/06/2019):  This is the current hospital active medication list Current Facility-Administered Medications  Medication Dose Route Frequency Provider Last Rate Last Dose  .  0.9 %  sodium chloride infusion   Intravenous PRN Epifanio Lesches, MD   Stopped at 04/04/19 0700  . acetaminophen (TYLENOL) tablet 650 mg  650 mg Oral Q6H PRN Demetrios Loll, MD       Or  . acetaminophen (TYLENOL) suppository 650 mg  650 mg Rectal Q6H PRN Demetrios Loll, MD      . albuterol  (PROVENTIL) (2.5 MG/3ML) 0.083% nebulizer solution 2.5 mg  2.5 mg Nebulization Q2H PRN Demetrios Loll, MD      . amantadine (SYMMETREL) capsule 200 mg  200 mg Oral BID Demetrios Loll, MD   200 mg at 04/06/19 1027  . benztropine (COGENTIN) tablet 1 mg  1 mg Oral QHS Demetrios Loll, MD   1 mg at 04/05/19 2149  . bisacodyl (DULCOLAX) EC tablet 5 mg  5 mg Oral Daily PRN Demetrios Loll, MD   5 mg at 04/06/19 1028  . calcium carbonate (TUMS - dosed in mg elemental calcium) chewable tablet 500 mg  500 mg Oral Q breakfast Demetrios Loll, MD   500 mg at 04/06/19 0809  . carbidopa-levodopa (SINEMET IR) 25-100 MG per tablet immediate release 3 tablet  3 tablet Oral TID Demetrios Loll, MD   3 tablet at 04/06/19 1511  . folic acid (FOLVITE) tablet 1 mg  1 mg Oral Daily Demetrios Loll, MD   1 mg at 04/06/19 0807  . heparin injection 5,000 Units  5,000 Units Subcutaneous Q8H Demetrios Loll, MD   5,000 Units at 04/06/19 1508  . hydrOXYzine (ATARAX/VISTARIL) tablet 10 mg  10 mg Oral TID PRN Lang Snow, NP   10 mg at 04/06/19 1521  . LORazepam (ATIVAN) injection 2 mg  2 mg Intravenous Q6H PRN Epifanio Lesches, MD   2 mg at 04/03/19 1804  . multivitamin with minerals tablet 1 tablet  1 tablet Oral Daily Demetrios Loll, MD   1 tablet at 04/06/19 [id number]  . nicotine (NICODERM CQ - dosed in mg/24 hours) patch 14 mg  14 mg Transdermal Daily Demetrios Loll, MD   14 mg at 04/05/19 0946  . nitrofurantoin (macrocrystal-monohydrate) (MACROBID) capsule 100 mg  100 mg Oral Q12H Epifanio Lesches, MD   100 mg at 04/06/19 1028  . ondansetron (ZOFRAN) tablet 4 mg  4 mg Oral Q6H PRN Demetrios Loll, MD       Or  . ondansetron [facility]) injection 4 mg  4 mg Intravenous Q6H PRN Demetrios Loll, MD      . oxyCODONE (Oxy IR/ROXICODONE) immediate release tablet 5 mg  5 mg Oral Q4H PRN Demetrios Loll, MD   5 mg at 04/06/19 1520  . pantoprazole (PROTONIX) EC tablet 40 mg  40 mg Oral Daily Epifanio Lesches, MD   40 mg at 04/06/19 0807  . phenol (CHLORASEPTIC) mouth spray 1  spray  1 spray Mouth/Throat PRN Demetrios Loll, MD   1 spray at 04/03/19 2055  . potassium chloride SA (KLOR-CON) CR tablet 40 mEq  40 mEq Oral Once Demetrios Loll, MD      . propranolol (INDERAL) tablet 20 mg  20 mg Oral TID Demetrios Loll, MD   20 mg at 04/06/19 1511  . senna-docusate (Senokot-S) tablet 1 tablet  1 tablet Oral BID Epifanio Lesches, MD   1 tablet at 04/06/19 0808  . thiamine (VITAMIN B-1) tablet 100 mg  100 mg Oral Daily Demetrios Loll, MD   100 mg at 04/06/19 [id number]  . traZODone (DESYREL) tablet 300 mg  300 mg Oral QHS PRN Demetrios Loll, MD  300 mg at 04/03/19 2053  . venlafaxine XR (EFFEXOR-XR) 24 hr capsule 300 mg  300 mg Oral Q breakfast Demetrios Loll, MD   300 mg at 04/06/19 [id number]     Discharge Medications: Please see discharge summary for a list of discharge medications.  Relevant Imaging Results:  Relevant Lab Results:   Additional Information SSN [id number]  Ross Ludwig, LCSW

## 2019-04-06 NOTE — Progress Notes (Signed)
Physical Therapy Treatment Patient Details Name: [name] MRN: [id number] DOB: Nov 16, 1955 Today's Date: 04/06/2019    History of Present Illness Per MD H&P: Pt is a 63 y.o. female with a known history that includes anxiety, asthma, bipolar disorder, cancer, LUE amputation, ETOH abuse, insomnia, neuropathy, and recent L THA revision.  The patient presents to the ED with Hallucination, tremors and altered mental status.  The patient was found by neighbors drinking a bottle of oral gel due to tooth pain this morning.  Pt is found with acute renal failure, suspected to have UTI and given Cipro in the ED.  MD assessment includes: AKI, hypokalemia, Acute metabolic encephalopathy, h/o alcohol use on CIWA, and leukocytosis.    PT Comments    Pt in bed, 2nd attempt and encouragement, pt agrees to gait.  To edge of bed without difficulty.  Once sitting steady but needs min assist to stand due to lower bed height (L Post hip precautions maintained).  She was able to walk in room to door and back x 2 with 1 loftstrand crutch.  She required increased assist today for gait due to generally unsteady gait.    Discussed discharge plan.  Pt stated she lives alone with little support and 2 cats she needs to take care of.  Has a friend helping take care of her cats while she is here but she does not provide any other support for her.  RN in room at end of session.  Friend has voiced concern over her discharge home and overall safety.  Pt stated she is fearful when walking which along with increased fatigue and LE weakness is why she limits gait.  She stated she does not feel like she is walking as well as when she was discharged last admission for THR.  She stated she plans to use her wheelchair for mobility at home and not walk.  Discussed STR with pt.  While initially she is firmly opposed to considering rehab due to her cats and her perception that she will have to stay for 3 months.  Education provided and she  was open to discussing options with care management/SWS to increase overall safety and independence for a safe discharge home.  Discussed with primary PT.   Follow Up Recommendations  SNF;Other (comment)  If Pt returns home she will benefit from HHPT and 24 hours supervision and assist for mobility.     Equipment Recommendations  Other (comment)(Loftstrand crutch)    Recommendations for Other Services       Precautions / Restrictions Precautions Precautions: Posterior Hip Precaution Booklet Issued: Yes (comment) Restrictions Weight Bearing Restrictions: Yes LLE Weight Bearing: Weight bearing as tolerated Other Position/Activity Restrictions: Hip precautions/WB status taken from chart review from recent admission for L THA revision.    Mobility  Bed Mobility Overal bed mobility: Independent Bed Mobility: Supine to Sit;Sit to Supine              Transfers Overall transfer level: Needs assistance Equipment used: Lofstrands Transfers: Sit to/from Stand Sit to Stand: Min assist         General transfer comment: bed lower today due to bed control falling off.  Ambulation/Gait Ambulation/Gait assistance: Min assist Gait Distance (Feet): 60 Feet Assistive device: Lofstrands Gait Pattern/deviations: Step-through pattern;Decreased step length - right;Decreased step length - left Gait velocity: Decreased   General Gait Details: very unsteady, hesitant gait with frequent imbalances.  self limits   Stairs  Wheelchair Mobility    Modified Rankin (Stroke Patients Only)       Balance Overall balance assessment: Needs assistance Sitting-balance support: Feet supported Sitting balance-Leahy Scale: Good     Standing balance support: Single extremity supported Standing balance-Leahy Scale: Fair Standing balance comment: Pt demonstrates decreased balance today with several imbalance with close min gaurd/assist.  Pt reports being fearful of falling  which causes her to limit gait.                            Cognition Arousal/Alertness: Awake/alert Behavior During Therapy: WFL for tasks assessed/performed Overall Cognitive Status: No family/caregiver present to determine baseline cognitive functioning                                 General Comments: Pt remains somewhat anxious during the session but continues to improve grossly      Exercises      General Comments        Pertinent Vitals/Pain Pain Assessment: No/denies pain Pain Location: L hip Pain Descriptors / Indicators: Aching;Sore Pain Intervention(s): Limited activity within patient's tolerance;Monitored during session    Home Living                      Prior Function            PT Goals (current goals can now be found in the care plan section) Progress towards PT goals: Progressing toward goals    Frequency    Min 2X/week      PT Plan Discharge plan needs to be updated    Co-evaluation              AM-PAC PT "6 Clicks" Mobility   Outcome Measure  Help needed turning from your back to your side while in a flat bed without using bedrails?: None Help needed moving from lying on your back to sitting on the side of a flat bed without using bedrails?: None Help needed moving to and from a bed to a chair (including a wheelchair)?: A Little Help needed standing up from a chair using your arms (e.g., wheelchair or bedside chair)?: A Little Help needed to walk in hospital room?: A Little Help needed climbing 3-5 steps with a railing? : A Lot 6 Click Score: 19    End of Session Equipment Utilized During Treatment: Gait belt Activity Tolerance: Patient tolerated treatment well;Other (comment) Patient left: in bed;with call bell/phone within reach;with nursing/sitter in room Nurse Communication: Mobility status       Time: [id number] PT Time Calculation (min) (ACUTE ONLY): 13 min  Charges:  $Gait Training:  8-22 mins                     Chesley Noon, PTA 04/06/19, 11:04 AM

## 2019-04-06 NOTE — TOC Initial Note (Signed)
[facility]) - Initial/Assessment Note    Patient Details  Name: [name] MRN: [id number] Date of Birth: April 07, 1956  [facility]) CM/SW Contact:    Ross Ludwig, LCSW Phone Number: 04/06/2019, 5:09 PM  Clinical Narrative:                  Patient is a 63 year old female who is alert and oriented x4.  CSW attempted to speak with patient but she was sleeping, CSW spoke to patient's neighbor and her son to complete assessment.  Patient neighbor and son, stated they are worried that patient is not safe at home, and she has been to the emergency room several times in the past week.  Patient continues to use alcohol and psych has seen here, and cleared her.  Per PT report, yesterday patient was not agreeable to SNF, and was planning to return back home with home health.  Patient is currently active with [facility], and receives services.  Today PT worked with her and mentioned that she is considering SNF now because she realized how weak she is.  Patient informed SNF that she would consider it, CSW also spoke to patient's son, and he is agreeable to having patient go to SNF for short term rehab.  CSW has been given permission to begin bed search in [facility].  Expected Discharge Plan: Skilled Nursing Facility Barriers to Discharge: Continued Medical Work up   Patient Goals and CMS Choice Patient states their goals for this hospitalization and ongoing recovery are:: Patient would like to go home with home health, but is considering SNF for short term rehab. CMS Medicare.gov Compare Post Acute Care list provided to:: Patient Represenative (must comment) Choice offered to / list presented to : Adult Children  Expected Discharge Plan and Services Expected Discharge Plan: [place] Choice: [place] arrangements for the past 2 months: Apartment                                      Prior  Living Arrangements/Services Living arrangements for the past 2 months: Apartment Lives with:: Self Patient language and need for interpreter reviewed:: Yes Do you feel safe going back to the place where you live?: No   Patient needs short term rehab, before she is able to return back home.  Need for Family Participation in Patient Care: Yes (Comment) Care giver support system in place?: No (comment)   Criminal Activity/Legal Involvement Pertinent to Current Situation/Hospitalization: No - Comment as needed  Activities of Daily Living Home Assistive Devices/Equipment: (cane) ADL Screening (condition at time of admission) Patient's cognitive ability adequate to safely complete daily activities?: No Is the patient deaf or have difficulty hearing?: No Does the patient have difficulty seeing, even when wearing glasses/contacts?: Yes Does the patient have difficulty concentrating, remembering, or making decisions?: Yes Patient able to express need for assistance with ADLs?: Yes Does the patient have difficulty dressing or bathing?: No Independently performs ADLs?: No Communication: Independent Dressing (OT): Needs assistance Is this a change from baseline?: Change from baseline, expected to last <3days Grooming: Needs assistance Is this a change from baseline?: Change from baseline, expected to last <3 days Feeding: Independent Bathing: Needs assistance Is this a change from baseline?: Change from baseline, expected to last <3 days Toileting: Needs assistance Is this a change  from baseline?: Change from baseline, expected to last <3 days In/Out Bed: Independent Walks in Home: Independent with device (comment)(cane) Does the patient have difficulty walking or climbing stairs?: Yes Weakness of Legs: Left Weakness of Arms/Hands: Left  Permission Sought/Granted Permission sought to share information with : Facility Sport and exercise psychologist, Family Supports Permission granted to share  information with : Yes, Verbal Permission Granted  Share Information with NAME: Romie Jumper   [id number] or [place], [place]   [phone]  Permission granted to share info w AGENCY: SNF admissions        Emotional Assessment Appearance:: Appears stated age   Affect (typically observed): Appropriate, Accepting Orientation: : Oriented to Self, Oriented to Place, Oriented to  Time, Oriented to Situation Alcohol / Substance Use: Alcohol Use Psych Involvement: No (comment), Yes (comment)  Admission diagnosis:  AKI (acute kidney injury) ([place]) [N17.9] Altered mental status, unspecified altered mental status type [R41.82] Patient Active Problem List   Diagnosis Date Noted  . Altered mental status   . ARF (acute renal failure) ([place]) 03/31/2019  . Back pain 03/16/2019  . Constipation 03/16/2019  . Parkinson disease ([place]) 03/16/2019  . Murmur 03/16/2019  . Neck pain 03/16/2019  . 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 Essential hypertension 02/22/2019  . At high risk for falls 01/25/2019  . Drug-induced tremor 01/25/2019  . Essential tremor 01/25/2019  . 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 Displacement of internal left hip prosthesis ([place]) 11/29/2018  . Hip dislocation, left ([place]) 11/29/2018  . Hip dislocation, left, sequela 08/06/2018  . High anion gap metabolic acidosis 99991111  . History of alcohol abuse 10/09/2016  . Neuropathy of left upper extremity 10/09/2016  . Urinary incontinence, mixed 10/09/2016  . Overdose of antidepressant 07/03/2016  . 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 Alcohol use disorder, severe, dependence ([place]) 01/26/2015  . Tobacco use disorder 01/26/2015  . Nicotine dependence, uncomplicated 123XX123  . Alcohol withdrawal ([place]) 01/08/2015  . 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 Suicidal ideation 12/19/2014  . Alcohol use  10/30/2014  . Anxiety 10/30/2014  . Depression 10/30/2014  . GAD (generalized anxiety disorder) 12/24/2013  . PTSD (post-traumatic stress disorder) 12/22/2013   PCP:  Dion Body, MD Pharmacy:   [facility] [place], [place] - [address] [place] [address] Phone: [phone] Fax: [phone]  [id number] - [address], [place] [address] Phone: [phone] Fax: [phone]     Social Determinants of Health (SDOH) Interventions    Readmission Risk Interventions No flowsheet data found.

## 2019-04-06 NOTE — TOC Progression Note (Signed)
Transition of Care [facility]) - Progression Note    Patient Details  Name: [name] MRN: [id number] Date of Birth: 04/21/1956  Transition of Care [facility]) CM/SW Contact  Ross Ludwig, [place] Phone Number: 04/06/2019, 5:45 PM  Clinical Narrative:     CSW attempted to speak with patient but she was sleeping.  CSW spoke with patient's son and her neighbor, they would like her to go to SNF for short term rehab if possible.  CSW explained to them it will depend on if patient agrees to it or not.  If she is agreeable, it will depend on availability.  CSW to begin bed search in [place].   Expected Discharge Plan: [place] Barriers to Discharge: Continued Medical Work up  Expected Discharge Plan and Services Expected Discharge Plan: [place] Choice: [place] arrangements for the past 2 months: Apartment                                       Social Determinants of Health (SDOH) Interventions    Readmission Risk Interventions Readmission Risk Prevention Plan 04/06/2019  Transportation Screening Complete  PCP or Specialist Appt within 5-7 Days Complete  Home Care Screening Complete  Medication Review (RN CM) Referral to Pharmacy  Some recent data might be hidden

## 2019-04-07 LAB — BASIC METABOLIC PANEL
Anion gap: 16 — ABNORMAL HIGH (ref 5–15)
BUN: 17 mg/dL (ref 8–23)
CO2: 23 mmol/L (ref 22–32)
Calcium: 9 mg/dL (ref 8.9–10.3)
Chloride: 101 mmol/L (ref 98–111)
Creatinine, Ser: 0.79 mg/dL (ref 0.44–1.00)
GFR calc Af Amer: >60 mL/min (ref >60)
GFR calc non Af Amer: >60 mL/min (ref >60)
Glucose, Bld: 95 mg/dL (ref 70–99)
Potassium: 3.1 mmol/L — ABNORMAL LOW (ref 3.5–5.1)
Sodium: 140 mmol/L (ref 135–145)

## 2019-04-07 LAB — CBC
HCT: 34.6 % — ABNORMAL LOW (ref 36.0–46.0)
Hemoglobin: 11.5 g/dL — ABNORMAL LOW (ref 12.0–15.0)
MCH: 31.5 pg (ref 26.0–34.0)
MCHC: 33.2 g/dL (ref 30.0–36.0)
MCV: 94.8 fL (ref 80.0–100.0)
Platelets: 285 10*3/uL (ref 150–400)
RBC: 3.65 MIL/uL — ABNORMAL LOW (ref 3.87–5.11)
RDW: 14.5 % (ref 11.5–15.5)
WBC: 8.3 10*3/uL (ref 4.0–10.5)
nRBC: 0 % (ref 0.0–0.2)

## 2019-04-07 MED ORDER — POTASSIUM CHLORIDE CRYS ER 10 MEQ PO TBCR
30.0000 meq | EXTENDED_RELEASE_TABLET | Freq: Once | ORAL | Status: AC
  Administered 2019-04-07: 30 meq via ORAL
  Filled 2019-04-07: qty 3

## 2019-04-07 NOTE — Progress Notes (Signed)
[place] at [place] NAME: [name]    MR#:  [id number] Alert, awake, oriented, pleasant, denies any complaints. DATE OF BIRTH:   Chief Complaint  Patient presents with  . Psychiatric Evaluation  Patient seen and evaluated today Confused on and off.   REVIEW OF SYSTEMS:    Review of Systems  Constitutional: Negative for chills and fever.  HENT: Negative for hearing loss.   Eyes: Negative for blurred vision, double vision and photophobia.  Respiratory: Negative for cough, hemoptysis and shortness of breath.   Cardiovascular: Negative for palpitations, orthopnea and leg swelling.  Gastrointestinal: Negative for abdominal pain, diarrhea and vomiting.  Genitourinary: Negative for dysuria and urgency.  Musculoskeletal: Negative for myalgias and neck pain.  Skin: Negative for rash.  Neurological: Negative for dizziness, focal weakness, seizures, weakness and headaches.  Psychiatric/Behavioral: Negative for memory loss. The patient does not have insomnia.     Patient alert, awake, oriented.  Says that she lives alone and feels very shaky and unstable and does not feel like she can take care of herself at home.  DRUG ALLERGIES:   Allergies  Allergen Reactions  . Benadryl [Diphenhydramine] Hives and Other (See Comments)    Reaction:  Hyperactivity   . Benadryl [Diphenhydramine] Other (See Comments)    My jaw locked and I could not speak, like a stroke.  . Cephalosporins Hives  . Codeine Nausea And Vomiting    VITALS:  Blood pressure (!) 102/41, pulse (!) 56, temperature 98.4 F (36.9 C), temperature source Oral, resp. rate 16, height 5\' 5"  (1.651 m), SpO2 94 %.  PHYSICAL EXAMINATION:   Physical Exam  GENERAL:  63 y.o.-year-old patient lying in the bed with no acute distress.  EYES: Pupils equal, round, reactive to light and accommodation. No scleral icterus. Extraocular muscles intact.  HEENT: Head atraumatic, normocephalic.  Oropharynx dry and nasopharynx clear.  NECK:  Supple, no jugular venous distention. No thyroid enlargement, no tenderness.  LUNGS: Normal breath sounds bilaterally, no wheezing, rales, rhonchi. No use of accessory muscles of respiration.  CARDIOVASCULAR: S1, S2 normal. No murmurs, rubs, or gallops.  ABDOMEN: Soft, nontender, nondistended. Bowel sounds present. No organomegaly or mass.  EXTREMITIES: No cyanosis, clubbing or edema b/l.    NEUROLOGIC: No gross neurological deficit observed. PSYCHIATRIC: ALERT, AWAKE, ORIENTED.  SKIN: No obvious rash, lesion, or ulcer.   LABORATORY PANEL:   CBC Recent Labs  Lab 04/01/19 0338  WBC 9.9  HGB 11.3*  HCT 34.5*  PLT 332   ------------------------------------------------------------------------------------------------------------------ Chemistries  Recent Labs  Lab 03/31/19 2046  04/02/19 0532  NA 136   < > 143  K 3.1*   < > 3.9  CL 103   < > 107  CO2 16*   < > 19*  GLUCOSE 63*   < > 89  BUN 58*   < > 29*  CREATININE 2.34*  2.36*   < > 0.96  CALCIUM 8.6*   < > 8.9  MG 1.9  --   --   AST 17  --  17  ALT 6  --  6  ALKPHOS 70  --  71  BILITOT 2.6*   < > 2.0*   < > = values in this interval not displayed.   ------------------------------------------------------------------------------------------------------------------  Cardiac Enzymes No results for input(s): TROPONINI in the last 168 hours. ------------------------------------------------------------------------------------------------------------------  RADIOLOGY:  No results found.   ASSESSMENT AND PLAN:   63 year old female patient with history of anxiety disorder  bipolar disorder alcohol abuse, neuropathy currently under hospitalist service  -Acute kidney injury: Resolved Encourage p.o. intake.  Renal function improved.  -Hypokalemia Replace potassium aggressively, potassium improved.  From [id number].  -Acute metabolic encephalopathy improved, has underlying major  depression  -Tobacco abuse Tobacco cessation counseled to the patient for 6 minutes Nicotine patch offered  -History of alcohol use CIWA protocol  ESBL UTI, continue Macrodantin Generalized weakness, ambulatory dysfunction, physical therapy recommends skilled nursing facility.  Patient medically stable when the arrangements are made.    .  And case discussed with Care Management/Social Worker. Management plans discussed with the patient, family and they are in agreement.  CODE STATUS: Full code  DVT Prophylaxis: SCDs  TOTAL TIME TAKING CARE OF THIS PATIENT: 36 minutes.   POSSIBLE D/C IN 1 to 2 DAYS, DEPENDING ON CLINICAL CONDITION. More than 50% time patient document patient is also to the nurse case manager. Epifanio Lesches M.D on 04/07/2019 at 10:41 AM  Between 7am to 6pm - Pager - [phone]  After 6pm go to [email or web address] - password [email or web address] [facility]  Office  [phone]  CC: Primary care physician; Dion Body, MD  Note: This dictation was prepared with Dragon dictation along with smaller phrase technology. Any transcriptional errors that result from this process are unintentional.

## 2019-04-07 NOTE — TOC Progression Note (Signed)
Transition of Care [facility]) - Progression Note    Patient Details  Name: [name] MRN: [id number] Date of Birth: [birth year]  Transition of Care [name]) CM/SW [place], [place] Phone Number: 04/07/2019, 2:13 PM  Clinical Narrative:  CSW spoke with patient regarding discharge plan. Patient is agreeable to go to SNF at discharge. CSW presented bed offers to patient and she states that she would like to think about it before making a decision. CSW will check with patient later to find out what she decides. CSW also updated patient's neighbor and son of above. CSW will continue to follow for discharge planning.      Expected Discharge Plan: [place] Barriers to Discharge: Continued Medical Work up  Expected Discharge Plan and Services Expected Discharge Plan: [place] Choice: [place] arrangements for the past 2 months: Apartment                                       Social Determinants of Health (SDOH) Interventions    Readmission Risk Interventions Readmission Risk Prevention Plan 04/06/2019  Transportation Screening Complete  PCP or Specialist Appt within 5-7 Days Complete  Home Care Screening Complete  Medication Review (RN CM) Referral to Pharmacy  Some recent data might be hidden

## 2019-04-08 NOTE — Progress Notes (Signed)
PT Cancellation Note  Patient Details Name: [name] MRN: [id number] DOB: Dec 09, 1955   Cancelled Treatment:    Reason Eval/Treat Not Completed: Other (comment)(PT attempted to enter room, pt stated that she did not want to work with PT right now. She said she has been up and moving to the bathroom all morning. Pt agreeable to be up in chair sometime today, RN notified and agreeable.)   Lieutenant Diego PT, DPT 9:58 AM,04/08/19 [phone]

## 2019-04-08 NOTE — TOC Progression Note (Signed)
Transition of Care [facility]) - Progression Note    Patient Details  Name: SHANEKIA NEALEY MRN: [id number] Date of Birth: [birth year]  Transition of Care [facility]) CM/SW [place], [place] Phone Number: 04/08/2019, 11:47 AM  Clinical Narrative:  CSW spoke with patient regarding discharge plan. Patient is agreeable to go to [facility]. CSW notified Claiborne Billings at [facility] of bed acceptance. Patient is medically ready for discharge but will need a new covid test per SNF policy. Patient will discharge when Covid test results are available. CSW updated patient's neighbor and son of above.      Expected Discharge Plan: [place] Barriers to Discharge: Continued Medical Work up  Expected Discharge Plan and Services Expected Discharge Plan: [place] Choice: [place] arrangements for the past 2 months: Apartment                                       Social Determinants of Health (SDOH) Interventions    Readmission Risk Interventions Readmission Risk Prevention Plan 04/06/2019  Transportation Screening Complete  PCP or Specialist Appt within 5-7 Days Complete  Home Care Screening Complete  Medication Review (RN CM) Referral to Pharmacy  Some recent data might be hidden

## 2019-04-08 NOTE — Progress Notes (Signed)
[place] at [place] NAME: [name]    MR#:  [id number] Alert, awake, oriented, pleasant, denies any complaints.  Patient will go to [place] tomorrow, COVID-19 test ordered DATE OF BIRTH:   Chief Complaint  Patient presents with  . Psychiatric Evaluation  Patient seen and evaluated today Confused on and off.   REVIEW OF SYSTEMS:    Review of Systems  Constitutional: Negative for chills and fever.  HENT: Negative for hearing loss.   Eyes: Negative for blurred vision, double vision and photophobia.  Respiratory: Negative for cough, hemoptysis and shortness of breath.   Cardiovascular: Negative for palpitations, orthopnea and leg swelling.  Gastrointestinal: Negative for abdominal pain, diarrhea and vomiting.  Genitourinary: Negative for dysuria and urgency.  Musculoskeletal: Negative for myalgias and neck pain.  Skin: Negative for rash.  Neurological: Negative for dizziness, focal weakness, seizures, weakness and headaches.  Psychiatric/Behavioral: Negative for memory loss. The patient does not have insomnia.     Patient alert, awake, oriented.  Says that she lives alone and feels very shaky and unstable and does not feel like she can take care of herself at home.  DRUG ALLERGIES:   Allergies  Allergen Reactions  . Benadryl [Diphenhydramine] Hives and Other (See Comments)    Reaction:  Hyperactivity   . Benadryl [Diphenhydramine] Other (See Comments)    My jaw locked and I could not speak, like a stroke.  . Cephalosporins Hives  . Codeine Nausea And Vomiting    VITALS:  Blood pressure 122/61, pulse 64, temperature 98.2 F (36.8 C), temperature source Oral, resp. rate 16, height 5\' 5"  (1.651 m), SpO2 97 %.  PHYSICAL EXAMINATION:   Physical Exam  GENERAL:  63 y.o.-year-old patient lying in the bed with no acute distress.  EYES: Pupils equal, round, reactive to light and accommodation. No scleral icterus.  Extraocular muscles intact.  HEENT: Head atraumatic, normocephalic. Oropharynx dry and nasopharynx clear.  NECK:  Supple, no jugular venous distention. No thyroid enlargement, no tenderness.  LUNGS: Normal breath sounds bilaterally, no wheezing, rales, rhonchi. No use of accessory muscles of respiration.  CARDIOVASCULAR: S1, S2 normal. No murmurs, rubs, or gallops.  ABDOMEN: Soft, nontender, nondistended. Bowel sounds present. No organomegaly or mass.  EXTREMITIES: No cyanosis, clubbing or edema b/l.    NEUROLOGIC: No gross neurological deficit observed. PSYCHIATRIC: ALERT, AWAKE, ORIENTED.  SKIN: No obvious rash, lesion, or ulcer.   LABORATORY PANEL:   CBC Recent Labs  Lab 04/07/19 1125  WBC 8.3  HGB 11.5*  HCT 34.6*  PLT 285   ------------------------------------------------------------------------------------------------------------------ Chemistries  Recent Labs  Lab 04/02/19 0532 04/07/19 1125  NA 143 140  K 3.9 3.1*  CL 107 101  CO2 19* 23  GLUCOSE 89 95  BUN 29* 17  CREATININE 0.96 0.79  CALCIUM 8.9 9.0  AST 17  --   ALT 6  --   ALKPHOS 71  --   BILITOT 2.0*  --    ------------------------------------------------------------------------------------------------------------------  Cardiac Enzymes No results for input(s): TROPONINI in the last 168 hours. ------------------------------------------------------------------------------------------------------------------  RADIOLOGY:  No results found.   ASSESSMENT AND PLAN:   63 year old female patient with history of anxiety disorder bipolar disorder alcohol abuse, neuropathy currently under hospitalist service  -Acute kidney injury: Resolved Encourage p.o. intake.  Renal function improved.  -Recurrent hypokalemia improved with supplements. -Acute metabolic encephalopathy improved, has underlying major depression  -Tobacco abuse Tobacco cessation counseled to the patient for 6 minutes Nicotine patch  offered  -  History of alcohol use CIWA protocol but patient is feeling better.  UTI secondary to ESBL, continue Macrodantin total of 7 days.  Discussed with pharmacist.  Generalized weakness, ambulatory dysfunction, p physical therapy recommends skilled nursing, patient and family chose [facility], COVID-19 test reordered as per nursing home protocol.. Social worker discussed with son.   .  And case discussed with Care Management/Social Worker. Management plans discussed with the patient, family and they are in agreement.  CODE STATUS: Full code  DVT Prophylaxis: SCDs  TOTAL TIME TAKING CARE OF THIS PATIENT: 36 minutes.   POSSIBLE D/C IN 1 to 2 DAYS, DEPENDING ON CLINICAL CONDITION. More than 50% time patient document patient is also to the nurse case manager. Epifanio Lesches M.D on 04/08/2019 at 1:41 PM  Between 7am to 6pm - Pager - [phone]  After 6pm go to [email or web address] - password [email or web address] [facility]  Office  [phone]  CC: Primary care physician; Dion Body, MD  Note: This dictation was prepared with Dragon dictation along with smaller phrase technology. Any transcriptional errors that result from this process are unintentional.

## 2019-04-09 LAB — SARS CORONAVIRUS 2 (TAT 6-24 HRS): SARS Coronavirus 2: NEGATIVE

## 2019-04-09 MED ORDER — TRAMADOL HCL 50 MG PO TABS: 50.0000 mg | ORAL_TABLET | ORAL | 1 refills | Status: DC | PRN

## 2019-04-09 MED ORDER — OXYCODONE HCL 5 MG PO TABS: 5.0000 mg | ORAL_TABLET | ORAL | 0 refills | Status: DC | PRN

## 2019-04-09 NOTE — Progress Notes (Signed)
[name] A and O x4. VSS. Pt tolerating diet well. No complaints of nausea or vomiting. IV removed intact, prescriptions given. Pt voices understanding of discharge instructions with no further questions. Patient discharged via wheelchair with NT  Allergies as of 04/09/2019      Reactions   Benadryl [diphenhydramine] Hives, Other (See Comments)   Reaction:  Hyperactivity    Benadryl [diphenhydramine] Other (See Comments)   My jaw locked and I could not speak, like a stroke.   Cephalosporins Hives   Codeine Nausea And Vomiting      Medication List    STOP taking these medications   clonazePAM 0.5 MG tablet Commonly known as: KLONOPIN   enoxaparin 40 MG/0.4ML injection Commonly known as: LOVENOX   hydrochlorothiazide 25 MG tablet Commonly known as: HYDRODIURIL     TAKE these medications   acetaminophen 500 MG tablet Commonly known as: TYLENOL Take 1,000 mg by mouth every 6 (six) hours as needed for mild pain or fever.   Amantadine HCl 100 MG tablet Take 200 mg by mouth 2 (two) times daily.   benztropine 1 MG tablet Commonly known as: COGENTIN Take 1 tablet (1 mg total) by mouth at bedtime for 7 days.   calcium carbonate 600 MG Tabs tablet Commonly known as: OS-CAL Take 600 mg by mouth daily with breakfast.   carbidopa-levodopa 25-100 MG tablet Commonly known as: SINEMET IR Take 3 tablets by mouth 3 (three) times daily.   celecoxib 200 MG capsule Commonly known as: CELEBREX Take 1 capsule (200 mg total) by mouth 2 (two) times daily.   cyclobenzaprine 5 MG tablet Commonly known as: FLEXERIL Take 5 mg by mouth 3 (three) times daily as needed for muscle spasms.   gabapentin 300 MG capsule Commonly known as: NEURONTIN Take 3 capsules (900 mg total) by mouth 3 (three) times daily.   nitrofurantoin (macrocrystal-monohydrate) 100 MG capsule Commonly known as: MACROBID Take 1 capsule (100 mg total) by mouth every 12 (twelve) hours for 14 days.   oxyCODONE 5 MG  immediate release tablet Commonly known as: Oxy IR/ROXICODONE Take 1 tablet (5 mg total) by mouth every 4 (four) hours as needed for moderate pain (pain score 4-6).   propranolol 10 MG tablet Commonly known as: INDERAL Take 1 tablet (10 mg total) by mouth 3 (three) times daily. What changed: how much to take   traMADol 50 MG tablet Commonly known as: ULTRAM Take 1-2 tablets (50-100 mg total) by mouth every 4 (four) hours as needed for moderate pain.   traZODone 150 MG tablet Commonly known as: DESYREL Take 300 mg by mouth at bedtime as needed for sleep.   venlafaxine XR 150 MG 24 hr capsule Commonly known as: EFFEXOR-XR Take 2 capsules (300 mg total) by mouth daily with breakfast.       Vitals:   04/09/19 0908 04/09/19 1237  BP: 134/73 138/72  Pulse: 60 75  Resp:  16  Temp:  (!) 97.5 F (36.4 C)  SpO2:  99%    [name]

## 2019-04-09 NOTE — Discharge Summary (Addendum)
[facility] [place] NAME: [name]    MR#:  [id number]  DATE OF BIRTH:  1956-04-25  DATE OF ADMISSION:  03/31/2019 ADMITTING PHYSICIAN: Demetrios Loll, MD  DATE OF DISCHARGE: 04/09/2019  PRIMARY CARE PHYSICIAN: Dion Body, MD    ADMISSION DIAGNOSIS:  AKI (acute kidney injury) ([place]) [N17.9] Altered mental status, unspecified altered mental status type [R41.82]  DISCHARGE DIAGNOSIS:  Active Problems:   PTSD (post-traumatic stress disorder)   GAD (generalized anxiety disorder)   ARF (acute renal failure) (HCC)   Altered mental status   SECONDARY DIAGNOSIS:   Past Medical History:  Diagnosis Date  . Anxiety   . Anxiety    panic anxiety disorder  . Arthritis   . Asthma    chronic asthmatic bronchitis  . Bipolar disorder ([place])   . Cancer [facility])    Desmoid tumor left forearm  . Depression   . ETOH abuse   . Heart murmur   . Insomnia   . Neuropathy     HOSPITAL COURSE:   63 year old female with past medical history of bipolar disorder, anxiety, asthma, depression, neuropathy who presented to the hospital due to altered mental status.  1.  Altered mental status-secondary to metabolic encephalopathy from acute kidney injury, UTI and also alcohol withdrawal. -Much improved and mental status close to baseline now.  Patient was treated with IV antibiotics for the UTI and now placed on oral Macrodantin.  Patient also received some IV fluids and creatinine has normalized.  Seen also by psychiatry given patient's psychiatric history and no acute need for acute inpatient psychiatric work-up or any changes in meds.   2.  Acute kidney injury-secondary to dehydration and poor p.o. intake.  Patient's diuretics were held in the hospital, she was hydrated IV fluids, creatinine has improved and normalized now.  Creatinine was over 2 on admission and now 0.79 upon discharge.  - Pt. Has been taken off her diuretic for now.   3.  Urinary  tract infection-secondary to ESBL.  Patient was initially treated with IV meropenem but now being discharged on oral Macrodantin.  Patient has been afebrile and hemodynamically stable.  4.  Hypokalemia-this has been supplemented and since then resolved.  5.  Depression/bipolar disorder-patient had periods of significant agitation and altered mental status.  Psychiatric consult was obtained.  They did not think that the patient's mental status change was secondary to the underlying psychiatric illness but likely metabolic encephalopathy.  No new med changes were made. -Patient currently denies any homicidal or suicidal ideations.  She is stable to be discharged from psychiatric standpoint. -She will continue her Effexor, Sinemet, amantadine, benztropine.  6.  Essential hypertension-patient will continue her propranolol.  Patient was seen by physical therapy due to her tremor and prolonged hospital course and after their evaluation they thought patient would benefit from short-term rehab.  Patient is currently in agreement.  Her Covid test is negative and she is being discharged to skilled nursing facility today.  DISCHARGE CONDITIONS:   Stable  CONSULTS OBTAINED:  Treatment Team:  Patrecia Pour, NP  DRUG ALLERGIES:   Allergies  Allergen Reactions  . Benadryl [Diphenhydramine] Hives and Other (See Comments)    Reaction:  Hyperactivity   . Benadryl [Diphenhydramine] Other (See Comments)    My jaw locked and I could not speak, like a stroke.  . Cephalosporins Hives  . Codeine Nausea And Vomiting    DISCHARGE MEDICATIONS:   Allergies as of 04/09/2019  Reactions   Benadryl [diphenhydramine] Hives, Other (See Comments)   Reaction:  Hyperactivity    Benadryl [diphenhydramine] Other (See Comments)   My jaw locked and I could not speak, like a stroke.   Cephalosporins Hives   Codeine Nausea And Vomiting      Medication List    STOP taking these medications   clonazePAM 0.5  MG tablet Commonly known as: KLONOPIN   enoxaparin 40 MG/0.4ML injection Commonly known as: LOVENOX   hydrochlorothiazide 25 MG tablet Commonly known as: HYDRODIURIL     TAKE these medications   acetaminophen 500 MG tablet Commonly known as: TYLENOL Take 1,000 mg by mouth every 6 (six) hours as needed for mild pain or fever.   Amantadine HCl 100 MG tablet Take 200 mg by mouth 2 (two) times daily.   benztropine 1 MG tablet Commonly known as: COGENTIN Take 1 tablet (1 mg total) by mouth at bedtime for 7 days.   calcium carbonate 600 MG Tabs tablet Commonly known as: OS-CAL Take 600 mg by mouth daily with breakfast.   carbidopa-levodopa 25-100 MG tablet Commonly known as: SINEMET IR Take 3 tablets by mouth 3 (three) times daily.   celecoxib 200 MG capsule Commonly known as: CELEBREX Take 1 capsule (200 mg total) by mouth 2 (two) times daily.   cyclobenzaprine 5 MG tablet Commonly known as: FLEXERIL Take 5 mg by mouth 3 (three) times daily as needed for muscle spasms.   gabapentin 300 MG capsule Commonly known as: NEURONTIN Take 3 capsules (900 mg total) by mouth 3 (three) times daily.   nitrofurantoin (macrocrystal-monohydrate) 100 MG capsule Commonly known as: MACROBID Take 1 capsule (100 mg total) by mouth every 12 (twelve) hours for 14 days.   oxyCODONE 5 MG immediate release tablet Commonly known as: Oxy IR/ROXICODONE Take 1 tablet (5 mg total) by mouth every 4 (four) hours as needed for moderate pain (pain score 4-6).   propranolol 10 MG tablet Commonly known as: INDERAL Take 1 tablet (10 mg total) by mouth 3 (three) times daily. What changed: how much to take   traMADol 50 MG tablet Commonly known as: ULTRAM Take 1-2 tablets (50-100 mg total) by mouth every 4 (four) hours as needed for moderate pain.   traZODone 150 MG tablet Commonly known as: DESYREL Take 300 mg by mouth at bedtime as needed for sleep.   venlafaxine XR 150 MG 24 hr capsule Commonly  known as: EFFEXOR-XR Take 2 capsules (300 mg total) by mouth daily with breakfast.         DISCHARGE INSTRUCTIONS:   DIET:  Regular diet  DISCHARGE CONDITION:  Stable  ACTIVITY:  Activity as tolerated  OXYGEN:  Home Oxygen: No.   Oxygen Delivery: room air  DISCHARGE LOCATION:  nursing home   If you experience worsening of your admission symptoms, develop shortness of breath, life threatening emergency, suicidal or homicidal thoughts you must seek medical attention immediately by calling 911 or calling your MD immediately  if symptoms less severe.  You Must read complete instructions/literature along with all the possible adverse reactions/side effects for all the Medicines you take and that have been prescribed to you. Take any new Medicines after you have completely understood and accpet all the possible adverse reactions/side effects.   Please note  You were cared for by a hospitalist during your hospital stay. If you have any questions about your discharge medications or the care you received while you were in the hospital after you are discharged, you  can call the unit and asked to speak with the hospitalist on call if the hospitalist that took care of you is not available. Once you are discharged, your primary care physician will handle any further medical issues. Please note that NO REFILLS for any discharge medications will be authorized once you are discharged, as it is imperative that you return to your primary care physician (or establish a relationship with a primary care physician if you do not have one) for your aftercare needs so that they can reassess your need for medications and monitor your lab values.     Today   No acute events overnight. Mental status at baseline. Afebrile, Hemodynamically stable. Agreeable to discharge to SNF and will discharge pt. There today.   VITAL SIGNS:  Blood pressure 134/73, pulse 60, temperature 97.9 F (36.6 C), temperature  source Oral, resp. rate 18, height 5\' 5"  (1.651 m), SpO2 95 %.  I/O:    Intake/Output Summary (Last 24 hours) at 04/09/2019 1008 Last data filed at 04/08/2019 2033 Gross per 24 hour  Intake 0 ml  Output 0 ml  Net 0 ml    PHYSICAL EXAMINATION:  GENERAL:  63 y.o.-year-old patient lying in the bed with no acute distress.  EYES: Pupils equal, round, reactive to light and accommodation. No scleral icterus. Extraocular muscles intact.  HEENT: Head atraumatic, normocephalic. Oropharynx and nasopharynx clear.  NECK:  Supple, no jugular venous distention. No thyroid enlargement, no tenderness.  LUNGS: Normal breath sounds bilaterally, no wheezing, rales,rhonchi. No use of accessory muscles of respiration.  CARDIOVASCULAR: S1, S2 normal. No murmurs, rubs, or gallops.  ABDOMEN: Soft, non-tender, non-distended. Bowel sounds present. No organomegaly or mass.  EXTREMITIES: No pedal edema, cyanosis, or clubbing.  NEUROLOGIC: Cranial nerves II through XII are intact. No focal motor or sensory defecits b/l.  PSYCHIATRIC: The patient is alert and oriented x 3.  SKIN: No obvious rash, lesion, or ulcer.   DATA REVIEW:   CBC Recent Labs  Lab 04/07/19 1125  WBC 8.3  HGB 11.5*  HCT 34.6*  PLT 285    Chemistries  Recent Labs  Lab 04/07/19 1125  NA 140  K 3.1*  CL 101  CO2 23  GLUCOSE 95  BUN 17  CREATININE 0.79  CALCIUM 9.0    Cardiac Enzymes No results for input(s): TROPONINI in the last 168 hours.  Microbiology Results  Results for orders placed or performed during the hospital encounter of 03/31/19  Blood culture (routine x 2)     Status: None   Collection Time: 03/31/19  2:04 PM   Specimen: BLOOD  Result Value Ref Range Status   Specimen Description BLOOD RIGHT ANTECUBITAL  Final   Special Requests   Final    BOTTLES DRAWN AEROBIC AND ANAEROBIC Blood Culture adequate volume   Culture   Final    NO GROWTH 5 DAYS Performed at [facility], [address]  [place], [place] [address]    Report Status 04/05/2019 FINAL  Final  Blood culture (routine x 2)     Status: None   Collection Time: 03/31/19  2:04 PM   Specimen: BLOOD  Result Value Ref Range Status   Specimen Description BLOOD BLOOD RIGHT HAND  Final   Special Requests   Final    BOTTLES DRAWN AEROBIC AND ANAEROBIC Blood Culture adequate volume   Culture   Final    NO GROWTH 5 DAYS Performed at [facility], [address], [place],  [address]  Report Status 04/05/2019 FINAL  Final  Urine Culture     Status: Abnormal   Collection Time: 03/31/19  2:46 PM   Specimen: Urine, Clean Catch  Result Value Ref Range Status   Specimen Description   Final    URINE, CLEAN CATCH Performed at [name], [address] [place], [place] [address]    Special Requests   Final    Normal Performed at [facility], [address] [place], [place] [address]    Culture (A)  Final    >=100,000 COLONIES/mL ESCHERICHIA COLI Confirmed Extended Spectrum Beta-Lactamase Producer (ESBL).  In bloodstream infections from ESBL organisms, carbapenems are preferred over piperacillin/tazobactam. They are shown to have a lower risk of mortality.    Report Status 04/02/2019 FINAL  Final   Organism ID, Bacteria ESCHERICHIA COLI (A)  Final      Susceptibility   Escherichia coli - MIC*    AMPICILLIN >=32 RESISTANT Resistant     CEFAZOLIN >=64 RESISTANT Resistant     CEFTRIAXONE >=64 RESISTANT Resistant     CIPROFLOXACIN >=4 RESISTANT Resistant     GENTAMICIN >=16 RESISTANT Resistant     IMIPENEM <=0.25 SENSITIVE Sensitive     NITROFURANTOIN <=16 SENSITIVE Sensitive     TRIMETH/SULFA <=20 SENSITIVE Sensitive     AMPICILLIN/SULBACTAM >=32 RESISTANT Resistant     PIP/TAZO 8 SENSITIVE Sensitive     Extended ESBL POSITIVE Resistant     * >=100,000 COLONIES/mL ESCHERICHIA COLI  SARS CORONAVIRUS 2 (TAT 6-24 HRS) Nasopharyngeal Nasopharyngeal Swab     Status: None   Collection  Time: 04/08/19  5:30 PM   Specimen: Nasopharyngeal Swab  Result Value Ref Range Status   SARS Coronavirus 2 NEGATIVE NEGATIVE Final    Comment: (NOTE) SARS-CoV-2 target nucleic acids are NOT DETECTED. The SARS-CoV-2 RNA is generally detectable in upper and lower respiratory specimens during the acute phase of infection. Negative results do not preclude SARS-CoV-2 infection, do not rule out co-infections with other pathogens, and should not be used as the sole basis for treatment or other patient management decisions. Negative results must be combined with clinical observations, patient history, and epidemiological information. The expected result is Negative. Fact Sheet for Patients: SugarRoll.be Fact Sheet for Healthcare Providers: https://www.woods-mathews.com/ This test is not yet approved or cleared by the [place] FDA and  has been authorized for detection and/or diagnosis of SARS-CoV-2 by FDA under an Emergency Use Authorization (EUA). This EUA will remain  in effect (meaning this test can be used) for the duration of the COVID-19 declaration under Section 56 4(b)(1) of the Act, 21 U.S.C. section 360bbb-3(b)(1), unless the authorization is terminated or revoked sooner. Performed at [facility], [place] [address] [place], [place] [address]     RADIOLOGY:  No results found.    Management plans discussed with the patient, family and they are in agreement.  CODE STATUS:     Code Status Orders  (From admission, onward)         Start     Ordered   03/31/19 2030  Full code  Continuous     03/31/19 2029          TOTAL TIME TAKING CARE OF THIS PATIENT: 40 minutes.    Henreitta Leber M.D on 04/09/2019 at 10:08 AM  Between 7am to 6pm - Pager - [phone]  After 6pm go to [email or web address] - [facility]  Office  [phone]  CC: Primary care  physician;  Dion Body, MD

## 2019-04-09 NOTE — TOC Transition Note (Addendum)
Transition of Care [facility]) - CM/SW Discharge Note   Patient Details  Name: [name] MRN: [id number] Date of Birth: May 09, 1956  Transition of Care [facility]) CM/SW Contact:  Ross Ludwig, LCSW Phone Number: 04/09/2019, 12:34 PM   Clinical Narrative:    Patient's Covid test has come back negative, CSW spoke to [facility] admissions they can accept patient today.  Patient to be d/c'ed today to [name].  Patient and family agreeable to plans will transport via friend's private vehicle RN to call report. Patient's friend is aware and will be picking patient up today.  Final next level of care: Skilled Nursing Facility Barriers to Discharge: Barriers Resolved   Patient Goals and CMS Choice Patient states their goals for this hospitalization and ongoing recovery are:: To go to SNF for short term rehab, and then return back home with home health. CMS Medicare.gov Compare Post Acute Care list provided to:: Patient Choice offered to / list presented to : Patient, Adult Children  Discharge Placement PASRR number recieved: 04/06/19            Patient chooses bed at: [facility] Patient to be transferred to facility by: Patient's friend Valente David Name of family member notified: Patient's friend Valente David. Patient and family notified of of transfer: 04/09/19  Discharge Plan and Services     Post Acute Care Choice: [place]          DME Arranged: N/A DME Agency: NA     Representative spoke with at DME Agency: na            Social Determinants of Health ([place]) Interventions     Readmission Risk Interventions Readmission Risk Prevention Plan 04/06/2019  Transportation Screening Complete  PCP or Specialist Appt within 5-7 Days Complete  Home Care Screening Complete  Medication Review (RN CM) Referral to Pharmacy  Some recent data might be hidden

## 2019-04-13 ENCOUNTER — Inpatient Hospital Stay: Payer: Medicare Other | Admitting: Anesthesiology

## 2019-04-13 ENCOUNTER — Emergency Department: Payer: Medicare Other

## 2019-04-13 ENCOUNTER — Other Ambulatory Visit: Payer: Self-pay

## 2019-04-13 ENCOUNTER — Inpatient Hospital Stay
Admission: EM | Admit: 2019-04-13 | Discharge: 2019-04-18 | DRG: 901 | Disposition: A | Discharge status: Skilled Nursing Facility | Payer: Medicare Other | Source: Skilled Nursing Facility | Attending: Internal Medicine | Admitting: Internal Medicine

## 2019-04-13 ENCOUNTER — Encounter
Admission: EM | Disposition: A | Discharge status: Skilled Nursing Facility | Payer: Self-pay | Source: Skilled Nursing Facility | Attending: Internal Medicine

## 2019-04-13 DIAGNOSIS — N390 Urinary tract infection, site not specified: Secondary | ICD-10-CM | POA: Diagnosis present

## 2019-04-13 DIAGNOSIS — E876 Hypokalemia: Secondary | ICD-10-CM | POA: Diagnosis present

## 2019-04-13 DIAGNOSIS — S060X9A Concussion with loss of consciousness of unspecified duration, initial encounter: Secondary | ICD-10-CM | POA: Diagnosis present

## 2019-04-13 DIAGNOSIS — Z1612 Extended spectrum beta lactamase (ESBL) resistance: Secondary | ICD-10-CM | POA: Diagnosis present

## 2019-04-13 DIAGNOSIS — E860 Dehydration: Secondary | ICD-10-CM | POA: Diagnosis present

## 2019-04-13 DIAGNOSIS — N179 Acute kidney failure, unspecified: Secondary | ICD-10-CM | POA: Diagnosis present

## 2019-04-13 DIAGNOSIS — F17210 Nicotine dependence, cigarettes, uncomplicated: Secondary | ICD-10-CM | POA: Diagnosis present

## 2019-04-13 DIAGNOSIS — B9620 Unspecified Escherichia coli [E. coli] as the cause of diseases classified elsewhere: Secondary | ICD-10-CM | POA: Diagnosis present

## 2019-04-13 DIAGNOSIS — F319 Bipolar disorder, unspecified: Secondary | ICD-10-CM | POA: Diagnosis present

## 2019-04-13 DIAGNOSIS — M1990 Unspecified osteoarthritis, unspecified site: Secondary | ICD-10-CM | POA: Diagnosis present

## 2019-04-13 DIAGNOSIS — U071 COVID-19: Secondary | ICD-10-CM

## 2019-04-13 DIAGNOSIS — Y92129 Unspecified place in nursing home as the place of occurrence of the external cause: Secondary | ICD-10-CM | POA: Diagnosis not present

## 2019-04-13 DIAGNOSIS — F411 Generalized anxiety disorder: Secondary | ICD-10-CM | POA: Diagnosis present

## 2019-04-13 DIAGNOSIS — G4700 Insomnia, unspecified: Secondary | ICD-10-CM | POA: Diagnosis present

## 2019-04-13 DIAGNOSIS — T8131XA Disruption of external operation (surgical) wound, not elsewhere classified, initial encounter: Principal | ICD-10-CM | POA: Diagnosis present

## 2019-04-13 DIAGNOSIS — Z885 Allergy status to narcotic agent status: Secondary | ICD-10-CM

## 2019-04-13 DIAGNOSIS — F0280 Dementia in other diseases classified elsewhere without behavioral disturbance: Secondary | ICD-10-CM | POA: Diagnosis present

## 2019-04-13 DIAGNOSIS — F4310 Post-traumatic stress disorder, unspecified: Secondary | ICD-10-CM | POA: Diagnosis present

## 2019-04-13 DIAGNOSIS — Z881 Allergy status to other antibiotic agents status: Secondary | ICD-10-CM

## 2019-04-13 DIAGNOSIS — I10 Essential (primary) hypertension: Secondary | ICD-10-CM | POA: Diagnosis present

## 2019-04-13 DIAGNOSIS — G629 Polyneuropathy, unspecified: Secondary | ICD-10-CM | POA: Diagnosis present

## 2019-04-13 DIAGNOSIS — Y792 Prosthetic and other implants, materials and accessory orthopedic devices associated with adverse incidents: Secondary | ICD-10-CM | POA: Diagnosis present

## 2019-04-13 DIAGNOSIS — G20 Parkinson's disease: Secondary | ICD-10-CM | POA: Diagnosis present

## 2019-04-13 DIAGNOSIS — G9341 Metabolic encephalopathy: Secondary | ICD-10-CM | POA: Diagnosis present

## 2019-04-13 DIAGNOSIS — J449 Chronic obstructive pulmonary disease, unspecified: Secondary | ICD-10-CM | POA: Diagnosis present

## 2019-04-13 DIAGNOSIS — Z9181 History of falling: Secondary | ICD-10-CM | POA: Diagnosis not present

## 2019-04-13 DIAGNOSIS — D62 Acute posthemorrhagic anemia: Secondary | ICD-10-CM | POA: Diagnosis not present

## 2019-04-13 DIAGNOSIS — Z90710 Acquired absence of both cervix and uterus: Secondary | ICD-10-CM

## 2019-04-13 DIAGNOSIS — Z96643 Presence of artificial hip joint, bilateral: Secondary | ICD-10-CM | POA: Diagnosis present

## 2019-04-13 DIAGNOSIS — W1830XA Fall on same level, unspecified, initial encounter: Secondary | ICD-10-CM | POA: Diagnosis present

## 2019-04-13 DIAGNOSIS — Z888 Allergy status to other drugs, medicaments and biological substances status: Secondary | ICD-10-CM

## 2019-04-13 DIAGNOSIS — Z825 Family history of asthma and other chronic lower respiratory diseases: Secondary | ICD-10-CM

## 2019-04-13 DIAGNOSIS — R400 Somnolence: Secondary | ICD-10-CM

## 2019-04-13 DIAGNOSIS — T8130XA Disruption of wound, unspecified, initial encounter: Secondary | ICD-10-CM

## 2019-04-13 DIAGNOSIS — Z79899 Other long term (current) drug therapy: Secondary | ICD-10-CM

## 2019-04-13 DIAGNOSIS — Z89222 Acquired absence of left upper limb above elbow: Secondary | ICD-10-CM

## 2019-04-13 HISTORY — PX: INCISION AND DRAINAGE: SHX5863

## 2019-04-13 LAB — BASIC METABOLIC PANEL
Anion gap: 13 (ref 5–15)
BUN: 26 mg/dL — ABNORMAL HIGH (ref 8–23)
CO2: 22 mmol/L (ref 22–32)
Calcium: 9.9 mg/dL (ref 8.9–10.3)
Chloride: 107 mmol/L (ref 98–111)
Creatinine, Ser: 1.29 mg/dL — ABNORMAL HIGH (ref 0.44–1.00)
GFR calc Af Amer: 51 mL/min — ABNORMAL LOW (ref >60)
GFR calc non Af Amer: 44 mL/min — ABNORMAL LOW (ref >60)
Glucose, Bld: 84 mg/dL (ref 70–99)
Potassium: 3 mmol/L — ABNORMAL LOW (ref 3.5–5.1)
Sodium: 142 mmol/L (ref 135–145)

## 2019-04-13 LAB — URINALYSIS, COMPLETE (UACMP) WITH MICROSCOPIC
Bacteria, UA: NONE SEEN
Bilirubin Urine: NEGATIVE
Glucose, UA: NEGATIVE mg/dL
Hgb urine dipstick: NEGATIVE
Ketones, ur: 20 mg/dL — AB
Leukocytes,Ua: NEGATIVE
Nitrite: NEGATIVE
Protein, ur: 30 mg/dL — AB
Specific Gravity, Urine: 1.024 (ref 1.005–1.030)
pH: 5 (ref 5.0–8.0)

## 2019-04-13 LAB — CBC
HCT: 35 % — ABNORMAL LOW (ref 36.0–46.0)
Hemoglobin: 11.1 g/dL — ABNORMAL LOW (ref 12.0–15.0)
MCH: 30.7 pg (ref 26.0–34.0)
MCHC: 31.7 g/dL (ref 30.0–36.0)
MCV: 97 fL (ref 80.0–100.0)
Platelets: 224 10*3/uL (ref 150–400)
RBC: 3.61 MIL/uL — ABNORMAL LOW (ref 3.87–5.11)
RDW: 14.4 % (ref 11.5–15.5)
WBC: 12.2 10*3/uL — ABNORMAL HIGH (ref 4.0–10.5)
nRBC: 0 % (ref 0.0–0.2)

## 2019-04-13 LAB — GLUCOSE, CAPILLARY: Glucose-Capillary: 73 mg/dL (ref 70–99)

## 2019-04-13 LAB — SARS CORONAVIRUS 2 BY RT PCR (HOSPITAL ORDER, PERFORMED IN ~~LOC~~ HOSPITAL LAB): SARS Coronavirus 2: NEGATIVE

## 2019-04-13 SURGERY — INCISION AND DRAINAGE
Anesthesia: General | Site: Hip | Laterality: Left

## 2019-04-13 MED ORDER — PROPOFOL 10 MG/ML IV BOLUS
INTRAVENOUS | Status: AC
  Filled 2019-04-13: qty 20

## 2019-04-13 MED ORDER — CLINDAMYCIN PHOSPHATE 600 MG/50ML IV SOLN
INTRAVENOUS | Status: AC
  Filled 2019-04-13: qty 50

## 2019-04-13 MED ORDER — POTASSIUM CHLORIDE 10 MEQ/100ML IV SOLN
10.0000 meq | Freq: Once | INTRAVENOUS | Status: AC
  Administered 2019-04-13: 10 meq via INTRAVENOUS
  Filled 2019-04-13: qty 100

## 2019-04-13 MED ORDER — NEOMYCIN-POLYMYXIN B GU 40-200000 IR SOLN
Status: AC
  Filled 2019-04-13: qty 20

## 2019-04-13 MED ORDER — LIDOCAINE HCL (PF) 2 % IJ SOLN
INTRAMUSCULAR | Status: AC
  Filled 2019-04-13: qty 10

## 2019-04-13 MED ORDER — FENTANYL CITRATE (PF) 100 MCG/2ML IJ SOLN
INTRAMUSCULAR | Status: AC
  Filled 2019-04-13: qty 2

## 2019-04-13 MED ORDER — SODIUM CHLORIDE 0.9 % IV BOLUS
500.0000 mL | Freq: Once | INTRAVENOUS | Status: AC
  Administered 2019-04-13: 500 mL via INTRAVENOUS

## 2019-04-13 MED ORDER — CLINDAMYCIN PHOSPHATE 600 MG/50ML IV SOLN
600.0000 mg | INTRAVENOUS | Status: AC
  Administered 2019-04-14: 600 mg via INTRAVENOUS
  Filled 2019-04-13: qty 50

## 2019-04-13 SURGICAL SUPPLY — 37 items
CANISTER SUCT 1200ML W/VALVE (MISCELLANEOUS) ×3 IMPLANT
CANISTER WOUND CARE 500ML ATS (WOUND CARE) ×1 IMPLANT
COVER WAND RF STERILE (DRAPES) ×1 IMPLANT
DRAPE INCISE IOBAN 66X60 STRL (DRAPES) ×3 IMPLANT
DRAPE WOUND VAC 10X15X1CM (MISCELLANEOUS) ×1 IMPLANT
DRSG DERMACEA 8X12 NADH (GAUZE/BANDAGES/DRESSINGS) ×1 IMPLANT
DURAPREP 26ML APPLICATOR (WOUND CARE) ×1 IMPLANT
ELECT REM PT RETURN 9FT ADLT (ELECTROSURGICAL) ×3
ELECTRODE REM PT RTRN 9FT ADLT (ELECTROSURGICAL) ×1 IMPLANT
GAUZE SPONGE 4X4 12PLY STRL (GAUZE/BANDAGES/DRESSINGS) ×3 IMPLANT
GLOVE BIO SURGEON STRL SZ 6.5 (GLOVE) ×2 IMPLANT
GLOVE BIO SURGEONS STRL SZ 6.5 (GLOVE) ×2
GLOVE BIOGEL M STRL SZ7.5 (GLOVE) ×4 IMPLANT
GLOVE BIOGEL PI IND STRL 6.5 (GLOVE) IMPLANT
GLOVE BIOGEL PI INDICATOR 6.5 (GLOVE) ×2
GOWN STRL REUS W/ TWL LRG LVL3 (GOWN DISPOSABLE) ×2 IMPLANT
GOWN STRL REUS W/TWL LRG LVL3 (GOWN DISPOSABLE) ×4
HANDPIECE VERSAJET DEBRIDEMENT (MISCELLANEOUS) ×1 IMPLANT
HEMOVAC 400CC 10FR (MISCELLANEOUS) ×1 IMPLANT
KIT PREVENA INCISION MGT 13 (CANNISTER) ×2 IMPLANT
KIT TURNOVER KIT A (KITS) ×3 IMPLANT
MANIFOLD NEPTUNE II (INSTRUMENTS) ×3 IMPLANT
NS IRRIG 1000ML POUR BTL (IV SOLUTION) ×3 IMPLANT
PACK HIP PROSTHESIS (MISCELLANEOUS) ×3 IMPLANT
PAD ABD DERMACEA PRESS 5X9 (GAUZE/BANDAGES/DRESSINGS) ×2 IMPLANT
PENCIL SMOKE ULTRAEVAC 22 CON (MISCELLANEOUS) ×3 IMPLANT
PULSAVAC PLUS IRRIG FAN TIP (DISPOSABLE) ×3
SLEEVE SCD COMPRESS THIGH MED (MISCELLANEOUS) ×2 IMPLANT
STAPLER SKIN PROX 35W (STAPLE) ×3 IMPLANT
SUT MON AB 2-0 CT1 36 (SUTURE) ×4 IMPLANT
SUT VIC AB 1 CTX 27 (SUTURE) ×2 IMPLANT
SUT VIC AB 2-0 CT1 27 (SUTURE)
SUT VIC AB 2-0 CT1 TAPERPNT 27 (SUTURE) ×2 IMPLANT
TIP BRUSH PULSAVAC PLUS 24.33 (MISCELLANEOUS) ×3 IMPLANT
TIP FAN IRRIG PULSAVAC PLUS (DISPOSABLE) ×1 IMPLANT
TRAY FOLEY MTR SLVR 16FR STAT (SET/KITS/TRAYS/PACK) ×1 IMPLANT
WATER STERILE IRR 1000ML POUR (IV SOLUTION) ×3 IMPLANT

## 2019-04-13 NOTE — ED Notes (Signed)
ED TO INPATIENT HANDOFF REPORT  ED Nurse Name and Phone #:  Shevlin  S Name/Age/Gender [name] 63 y.o. female Room/Bed: ED06A/ED06A  Code Status   Code Status: Prior  Home/SNF/Other Rehab Patient oriented to: self and situation Is this baseline? Yes   Triage Complete: Triage complete  Chief Complaint Fall  EMS  Triage Note  Pt comes into the ED via EMS from [facility],. Pt was there for rehab after a fall with hip fx and surgery. Pt states she fell today and has opened the surgical site again with clean abd pads in place.the patient has a hx of dementia pt removed her shirt at the front desk while triaging the pt. Pt shirt reapplied and pt taken to room 6 to finish the triage   Allergies Allergies  Allergen Reactions  . Benadryl [Diphenhydramine] Hives and Other (See Comments)    Reaction:  Hyperactivity   . Benadryl [Diphenhydramine] Other (See Comments)    My jaw locked and I could not speak, like a stroke.  . Cephalosporins Hives  . Codeine Nausea And Vomiting    Level of Care/Admitting Diagnosis ED Disposition    ED Disposition Condition [facility]: [place] [100120]  Level of Care: Med-Surg [16]  Covid Evaluation: Confirmed COVID Negative  [facility]: [id number]  Admitting Physician: Harrie Foreman [id number]  Attending Physician: Harrie Foreman [id number]  Estimated length of stay: past midnight tomorrow  Certification:: I certify this patient will need inpatient services for at least 2 midnights  PT Class (Do Not Modify): Inpatient [101]  PT Acc Code (Do Not Modify): Private [1]       B Medical/Surgery History Past Medical History:  Diagnosis Date  . Anxiety   . Anxiety    panic anxiety disorder  . Arthritis   . Asthma    chronic asthmatic bronchitis  . Bipolar disorder ([place])   . Cancer [facility])    Desmoid tumor left forearm  . Depression   . ETOH  abuse   . Heart murmur   . Insomnia   . Neuropathy    Past Surgical History:  Procedure Laterality Date  . ABDOMINAL HYSTERECTOMY    . ABDOMINAL HYSTERECTOMY  2007  . 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 ARM AMPUTATION AT SHOULDER Left   . CESAREAN SECTION    . [place]   x 2  . COLONOSCOPY    . ESOPHAGOGASTRODUODENOSCOPY (EGD) WITH PROPOFOL N/A 04/27/2018   Procedure: ESOPHAGOGASTRODUODENOSCOPY (EGD) WITH PROPOFOL;  Surgeon: Toledo, Benay Pike, MD;  Location: [facility] ENDOSCOPY;  Service: Gastroenterology;  Laterality: N/A;  . JOINT REPLACEMENT Right 2012   hip  . LUMBAR LAMINECTOMY/DECOMPRESSION MICRODISCECTOMY Left 04/16/2016   Procedure: LUMBAR LAMINECTOMY/DECOMPRESSION MICRODISCECTOMY 1 LEVEL;  Surgeon: Meade Maw, MD;  Location: [facility] ORS;  Service: Neurosurgery;  Laterality: Left;  Left L4-5 far lateral discectomy, left L4-5 laminoforaminotomy  . TOTAL HIP ARTHROPLASTY Left 09/17/2015   Procedure: TOTAL HIP ARTHROPLASTY;  Surgeon: Dereck Leep, MD;  Location: [facility] ORS;  Service: Orthopedics;  Laterality: Left;  . TOTAL HIP REVISION Left 03/16/2019   Procedure: TOTAL HIP REVISION ARTHROPLASTY WITH CONVERSION TO A CONSTRAINED LINER.;  Surgeon: Dereck Leep, MD;  Location: [facility] ORS;  Service: Orthopedics;  Laterality: Left;  . WISDOM TOOTH EXTRACTION       A IV Location/Drains/Wounds Patient Lines/Drains/Airways Status  Active Line/Drains/Airways    Name:   Placement date:   Placement time:   Site:   Days:   Peripheral IV 04/13/19 Right Antecubital   04/13/19    2050    Antecubital   less than 1   External Urinary Catheter   04/13/19    2047    -   less than 1   Incision (Closed) 03/16/19 Hip Left   03/16/19    0935     28          Intake/Output Last 24 hours No intake or output data in the 24 hours ending 04/13/19 2158  Labs/Imaging Results for orders placed or performed during the hospital encounter of 04/13/19 (from  the past 48 hour(s))  Urinalysis, Complete w Microscopic     Status: Abnormal   Collection Time: 04/13/19  8:14 PM  Result Value Ref Range   Color, Urine AMBER (A) YELLOW    Comment: BIOCHEMICALS MAY BE AFFECTED BY COLOR   APPearance HAZY (A) CLEAR   Specific Gravity, Urine 1.024 1.005 - 1.030   pH 5.0 5.0 - 8.0   Glucose, UA NEGATIVE NEGATIVE mg/dL   Hgb urine dipstick NEGATIVE NEGATIVE   Bilirubin Urine NEGATIVE NEGATIVE   Ketones, ur 20 (A) NEGATIVE mg/dL   Protein, ur 30 (A) NEGATIVE mg/dL   Nitrite NEGATIVE NEGATIVE   Leukocytes,Ua NEGATIVE NEGATIVE   RBC / HPF 0-5 0 - 5 RBC/hpf   WBC, UA 0-5 0 - 5 WBC/hpf   Bacteria, UA NONE SEEN NONE SEEN   Squamous Epithelial / LPF 0-5 0 - 5   Mucus PRESENT    Hyaline Casts, UA PRESENT     Comment: Performed at [name], [address] [place], [place] [address]  CBC     Status: Abnormal   Collection Time: 04/13/19  8:14 PM  Result Value Ref Range   WBC 12.2 (H) 4.0 - 10.5 K/uL   RBC 3.61 (L) 3.87 - 5.11 MIL/uL   Hemoglobin 11.1 (L) 12.0 - 15.0 g/dL   HCT 35.0 (L) 36.0 - 46.0 %   MCV 97.0 80.0 - 100.0 fL   MCH 30.7 26.0 - 34.0 pg   MCHC 31.7 30.0 - 36.0 g/dL   RDW 14.4 11.5 - 15.5 %   Platelets 224 150 - 400 K/uL   nRBC 0.0 0.0 - 0.2 %    Comment: Performed at [facility], [address] [place], [place] [id number]  Basic metabolic panel     Status: Abnormal   Collection Time: 04/13/19  8:14 PM  Result Value Ref Range   Sodium 142 135 - 145 mmol/L   Potassium 3.0 (L) 3.5 - 5.1 mmol/L   Chloride 107 98 - 111 mmol/L   CO2 22 22 - 32 mmol/L   Glucose, Bld 84 70 - 99 mg/dL   BUN 26 (H) 8 - 23 mg/dL   Creatinine, Ser 1.29 (H) 0.44 - 1.00 mg/dL   Calcium 9.9 8.9 - 10.3 mg/dL   GFR calc non Af Amer 44 (L) >60 mL/min   GFR calc Af Amer 51 (L) >60 mL/min   Anion gap 13 5 - 15    Comment: Performed at [name], [address] [place], [place] [address]  SARS Coronavirus 2 by RT PCR (hospital  order, performed in [place] hospital lab) Nasopharyngeal Nasopharyngeal Swab     Status: None   Collection Time: 04/13/19  8:14 PM   Specimen: Nasopharyngeal Swab  Result  Value Ref Range   SARS Coronavirus 2 NEGATIVE NEGATIVE    Comment: (NOTE) If result is NEGATIVE SARS-CoV-2 target nucleic acids are NOT DETECTED. The SARS-CoV-2 RNA is generally detectable in upper and lower  respiratory specimens during the acute phase of infection. The lowest  concentration of SARS-CoV-2 viral copies this assay can detect is 250  copies / mL. A negative result does not preclude SARS-CoV-2 infection  and should not be used as the sole basis for treatment or other  patient management decisions.  A negative result may occur with  improper specimen collection / handling, submission of specimen other  than nasopharyngeal swab, presence of viral mutation(s) within the  areas targeted by this assay, and inadequate number of viral copies  (<250 copies / mL). A negative result must be combined with clinical  observations, patient history, and epidemiological information. If result is POSITIVE SARS-CoV-2 target nucleic acids are DETECTED. The SARS-CoV-2 RNA is generally detectable in upper and lower  respiratory specimens dur ing the acute phase of infection.  Positive  results are indicative of active infection with SARS-CoV-2.  Clinical  correlation with patient history and other diagnostic information is  necessary to determine patient infection status.  Positive results do  not rule out bacterial infection or co-infection with other viruses. If result is PRESUMPTIVE POSTIVE SARS-CoV-2 nucleic acids MAY BE PRESENT.   A presumptive positive result was obtained on the submitted specimen  and confirmed on repeat testing.  While 2019 novel coronavirus  (SARS-CoV-2) nucleic acids may be present in the submitted sample  additional confirmatory testing may be necessary for epidemiological  and / or  clinical management purposes  to differentiate between  SARS-CoV-2 and other Sarbecovirus currently known to infect humans.  If clinically indicated additional testing with an alternate test  methodology [phone]) is advised. The SARS-CoV-2 RNA is generally  detectable in upper and lower respiratory sp ecimens during the acute  phase of infection. The expected result is Negative. Fact Sheet for Patients:  StrictlyIdeas.no Fact Sheet for Healthcare Providers: BankingDealers.co.za This test is not yet approved or cleared by the [place] FDA and has been authorized for detection and/or diagnosis of SARS-CoV-2 by FDA under an Emergency Use Authorization (EUA).  This EUA will remain in effect (meaning this test can be used) for the duration of the COVID-19 declaration under Section 564(b)(1) of the Act, 21 U.S.C. section 360bbb-3(b)(1), unless the authorization is terminated or revoked sooner. Performed at [facility], [address] [place], [place] [address]   Glucose, capillary     Status: None   Collection Time: 04/13/19  8:55 PM  Result Value Ref Range   Glucose-Capillary 73 70 - 99 mg/dL   Ct Head Wo Contrast  Result Date: 04/13/2019 CLINICAL DATA:  Head trauma secondary to a fall today. EXAM: CT HEAD WITHOUT CONTRAST TECHNIQUE: Contiguous axial images were obtained from the base of the skull through the vertex without intravenous contrast. COMPARISON:  CT scan dated 03/31/2019 FINDINGS: Brain: No evidence of acute infarction, hemorrhage, hydrocephalus, extra-axial collection or mass lesion/mass effect. Vascular: No hyperdense vessel or unexpected calcification. Skull: Normal. Negative for fracture or focal lesion. Sinuses/Orbits: Normal. Other: None IMPRESSION: Normal exam. Electronically Signed   By: Lorriane Shire M.D.   On: 04/13/2019 20:07   Dg Hip Unilat W Or Wo Pelvis 2-3 Views Left  Result Date:  04/13/2019 CLINICAL DATA:  Fall history of left hip fracture surgery EXAM: DG HIP (WITH OR WITHOUT PELVIS) 2-3V LEFT  COMPARISON:  03/16/2019 FINDINGS: Bilateral hip replacements with normal alignment and no fracture. Pubic symphysis and rami are intact. No SI joint widening. Small amount of gas within the subcutaneous soft tissues overlying the left trochanter. IMPRESSION: 1. Bilateral hip replacements without acute osseous abnormality 2. Small amount of soft tissue gas within the lateral soft tissues of left hip. This would be unusual given that surgery was almost a month ago. Correlate for any history of recent drain removal or for soft tissue injury in the region on physical exam. Electronically Signed   By: Donavan Foil M.D.   On: 04/13/2019 20:46    Pending Labs Unresulted Labs (From admission, onward)    Start     Ordered   Signed and Held  TSH  Add-on,   R     Signed and Held          Vitals/Pain Today's Vitals   04/13/19 2040 04/13/19 2050 04/13/19 2055 04/13/19 2100  BP: 102/78   125/60  Pulse: 68 64 65 63  Resp: 18 13 15 12   Temp:      TempSrc:      SpO2: 97% 96% 98% 97%  Weight:      Height:        Isolation Precautions No active isolations  Medications Medications  sodium chloride 0.9 % bolus 500 mL (has no administration in time range)  potassium chloride 10 mEq in 100 mL IVPB (has no administration in time range)  clindamycin (CLEOCIN) IVPB 600 mg (has no administration in time range)    Mobility walks with device High fall risk   Focused Assessments n/a   R Recommendations: See Admitting Provider Note  Report given to:   Additional Notes: n/a

## 2019-04-13 NOTE — ED Provider Notes (Signed)
[facility] Emergency Department Provider Note   ____________________________________________   First MD Initiated Contact with Patient 04/13/19 1927     (approximate)  I have reviewed the triage vital signs and the nursing notes.   HISTORY  Chief Complaint Fall  EM caveat altered mental status, lethargy  HPI [name] is a 63 y.o. female here for evaluation of bleeding from a left hip laceration   EMS reports the patient had a fall today, noticed bleeding and that her left hip replacement surgical site had opened up.  Past Medical History:  Diagnosis Date  . Anxiety   . Anxiety    panic anxiety disorder  . Arthritis   . Asthma    chronic asthmatic bronchitis  . Bipolar disorder ([place])   . Cancer [facility])    Desmoid tumor left forearm  . Depression   . ETOH abuse   . Heart murmur   . Insomnia   . Neuropathy     Patient Active Problem List   Diagnosis Date Noted  . Altered mental status   . ARF (acute renal failure) ([place]) 03/31/2019  . Back pain 03/16/2019  . Constipation 03/16/2019  . Parkinson disease ([place]) 03/16/2019  . Murmur 03/16/2019  . Neck pain 03/16/2019  . 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 Essential hypertension 02/22/2019  . At high risk for falls 01/25/2019  . Drug-induced tremor 01/25/2019  . Essential tremor 01/25/2019  . 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 Displacement of internal left hip prosthesis ([place]) 11/29/2018  . Hip dislocation, left ([place]) 11/29/2018  . Hip dislocation, left, sequela 08/06/2018  . High anion gap metabolic acidosis [id number]  . History of alcohol abuse 10/09/2016  . Neuropathy of left upper extremity 10/09/2016  . Urinary incontinence, mixed 10/09/2016  . Overdose of antidepressant 07/03/2016  . 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 Alcohol use disorder, severe, dependence ([place]) 01/26/2015  .  Tobacco use disorder 01/26/2015  . Nicotine dependence, uncomplicated [id number]  . Alcohol withdrawal ([place]) 01/08/2015  . 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 Suicidal ideation 12/19/2014  . Alcohol use 10/30/2014  . Anxiety 10/30/2014  . Depression 10/30/2014  . GAD (generalized anxiety disorder) 12/24/2013  . PTSD (post-traumatic stress disorder) 12/22/2013    Past Surgical History:  Procedure Laterality Date  . ABDOMINAL HYSTERECTOMY    . ABDOMINAL HYSTERECTOMY  2007  . 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 ARM AMPUTATION AT SHOULDER Left   . CESAREAN SECTION    . [place]   x 2  . COLONOSCOPY    . ESOPHAGOGASTRODUODENOSCOPY (EGD) WITH PROPOFOL N/A 04/27/2018   Procedure: ESOPHAGOGASTRODUODENOSCOPY (EGD) WITH PROPOFOL;  Surgeon: Toledo, Benay Pike, MD;  Location: [facility] ENDOSCOPY;  Service: Gastroenterology;  Laterality: N/A;  . JOINT REPLACEMENT Right 2012   hip  . LUMBAR LAMINECTOMY/DECOMPRESSION MICRODISCECTOMY Left 04/16/2016   Procedure: LUMBAR LAMINECTOMY/DECOMPRESSION MICRODISCECTOMY 1 LEVEL;  Surgeon: Meade Maw, MD;  Location: [facility] ORS;  Service: Neurosurgery;  Laterality: Left;  Left L4-5 far lateral discectomy, left L4-5 laminoforaminotomy  . TOTAL HIP ARTHROPLASTY Left 09/17/2015   Procedure: TOTAL HIP ARTHROPLASTY;  Surgeon: Dereck Leep, MD;  Location: [facility] ORS;  Service: Orthopedics;  Laterality: Left;  . TOTAL HIP REVISION Left 03/16/2019   Procedure: TOTAL HIP REVISION ARTHROPLASTY WITH CONVERSION TO A CONSTRAINED LINER.;  Surgeon:  Hooten, Laurice Record, MD;  Location: [facility] ORS;  Service: Orthopedics;  Laterality: Left;  . WISDOM TOOTH EXTRACTION      Prior to Admission medications   Medication Sig Start Date End Date Taking? Authorizing Provider  acetaminophen (TYLENOL) 500 MG tablet Take 1,000 mg by mouth every 6 (six) hours as needed for mild pain or fever.    Yes [provider]  Amantadine HCl 100 MG tablet Take 200 mg by mouth 2 (two) times daily.  01/05/19  Yes [provider]  benztropine (COGENTIN) 1 MG tablet Take 1 tablet (1 mg total) by mouth at bedtime for 7 days. 03/26/19 04/13/19 Yes Gregor Hams, MD  calcium carbonate (OS-CAL) 600 MG TABS tablet Take 600 mg by mouth daily with breakfast.   Yes [provider]  carbidopa-levodopa (SINEMET IR) 25-100 MG tablet Take 3 tablets by mouth 3 (three) times daily.    Yes [provider]  celecoxib (CELEBREX) 200 MG capsule Take 1 capsule (200 mg total) by mouth 2 (two) times daily. 03/17/19  Yes Reche Dixon, PA-C  chlordiazePOXIDE (LIBRIUM) 25 MG capsule Take 25 mg by mouth 3 (three) times daily.   Yes [provider]  cyclobenzaprine (FLEXERIL) 5 MG tablet Take 5 mg by mouth 3 (three) times daily as needed for muscle spasms.   Yes [provider]  gabapentin (NEURONTIN) 300 MG capsule Take 3 capsules (900 mg total) by mouth 3 (three) times daily. 05/21/17  Yes Pucilowska, [name], MD  nitrofurantoin, macrocrystal-monohydrate, (MACROBID) 100 MG capsule Take 1 capsule (100 mg total) by mouth every 12 (twelve) hours for 14 days. 04/05/19 04/19/19 Yes Epifanio Lesches, MD  oxyCODONE (OXY IR/ROXICODONE) 5 MG immediate release tablet Take 1 tablet (5 mg total) by mouth every 4 (four) hours as needed for moderate pain (pain score 4-6). 04/09/19  Yes Henreitta Leber, MD  propranolol (INDERAL) 10 MG tablet Take 1 tablet (10 mg total) by mouth 3 (three) times daily. Patient taking differently: Take 10 mg by mouth 3 (three) times daily.  05/21/17  Yes Pucilowska, [name], MD  traMADol (ULTRAM) 50 MG tablet Take 1-2 tablets (50-100 mg total) by mouth every 4 (four) hours as needed for moderate pain. 04/09/19  Yes Henreitta Leber, MD  traZODone (DESYREL) 150 MG tablet Take 150 mg by mouth at bedtime as needed for sleep.    Yes [provider]  venlafaxine XR  (EFFEXOR-XR) 150 MG 24 hr capsule Take 2 capsules (300 mg total) by mouth daily with breakfast. 11/29/18  Yes Fritzi Mandes, MD    Allergies Benadryl [diphenhydramine], Benadryl [diphenhydramine], Cephalosporins, and Codeine  Family History  Problem Relation Age of Onset  . COPD Mother     Social History Social History   Tobacco Use  . Smoking status: Current Every Day Smoker    Packs/day: 0.25    Types: Cigarettes  . Smokeless tobacco: Never Used  Substance Use Topics  . Alcohol use: Yes    Comment: 2 40oz beers daily  . Drug use: No    Comment: Patient denies     Review of Systems  EM caveat   ____________________________________________   PHYSICAL EXAM:  VITAL SIGNS: ED Triage Vitals  Enc Vitals Group     BP 04/13/19 1855 (!) 115/47     Pulse Rate 04/13/19 1855 70     Resp 04/13/19 1855 17     Temp 04/13/19 1855 98.4 F (36.9 C)     Temp Source 04/13/19  1855 Oral     SpO2 04/13/19 1855 95 %     Weight 04/13/19 1852 165 lb (74.8 kg)     Height 04/13/19 1852 5\' 5"  (1.651 m)     Head Circumference --      Peak Flow --      Pain Score --      Pain Loc --      Pain Edu? --      Excl. in [place]? --     Constitutional: Patient is very somnolent.  Responds to stimuli, but primarily very somnolent.  Does not participate in clinical history or focal examination Eyes: Conjunctivae are normal. Head: Atraumatic. Nose: No congestion/rhinnorhea. Mouth/Throat: Mucous membranes are slightly dry. Neck: No stridor.  Cardiovascular: Normal rate, regular rhythm. Grossly normal heart sounds.  Good peripheral circulation. Respiratory: Normal respiratory effort.  No retractions. Lungs CTAB. Gastrointestinal: Soft and nontender. No distention. Musculoskeletal: No lower extremity tenderness nor edema.  Old right hip site clean dry intact.  Left leg, no obvious deformities, but the left lateral hip incision is open about 2 to 3 inches, I did not probe this, but it does appear open  and deep with obvious subcutaneous and probable deep tissues visible. Neurologic: Unable to provide examination aside from gross examination which does not reveal any obvious deficit, but is quite limited by patient's somnolence Skin:  Skin is warm, dry and intact. No rash noted. Psychiatric: Mood and affect are somnolent  ____________________________________________   LABS (all labs ordered are listed, but only abnormal results are displayed)  Labs Reviewed  URINALYSIS, COMPLETE (UACMP) WITH MICROSCOPIC - Abnormal; Notable for the following components:      Result Value   Color, Urine AMBER (*)    APPearance HAZY (*)    Ketones, ur 20 (*)    Protein, ur 30 (*)    All other components within normal limits  CBC - Abnormal; Notable for the following components:   WBC 12.2 (*)    RBC 3.61 (*)    Hemoglobin 11.1 (*)    HCT 35.0 (*)    All other components within normal limits  BASIC METABOLIC PANEL - Abnormal; Notable for the following components:   Potassium 3.0 (*)    BUN 26 (*)    Creatinine, Ser 1.29 (*)    GFR calc non Af Amer 44 (*)    GFR calc Af Amer 51 (*)    All other components within normal limits  SARS CORONAVIRUS 2 BY RT PCR (HOSPITAL ORDER, [place] LAB)  GLUCOSE, CAPILLARY  CBG MONITORING, ED   ____________________________________________  EKG  Reviewed and entered and reviewed at 2140 Heart rate 65 QRS 100 QTc 460 Normal sinus rhythm, probable old anteroseptal infarct.  No evidence of an acute ischemia denoted ____________________________________________  RADIOLOGY  Ct Head Wo Contrast  Result Date: 04/13/2019 CLINICAL DATA:  Head trauma secondary to a fall today. EXAM: CT HEAD WITHOUT CONTRAST TECHNIQUE: Contiguous axial images were obtained from the base of the skull through the vertex without intravenous contrast. COMPARISON:  CT scan dated 03/31/2019 FINDINGS: Brain: No evidence of acute infarction, hemorrhage,  hydrocephalus, extra-axial collection or mass lesion/mass effect. Vascular: No hyperdense vessel or unexpected calcification. Skull: Normal. Negative for fracture or focal lesion. Sinuses/Orbits: Normal. Other: None IMPRESSION: Normal exam. Electronically Signed   By: Lorriane Shire M.D.   On: 04/13/2019 20:07   Dg Hip Unilat W Or Wo Pelvis 2-3 Views Left  Result Date: 04/13/2019 CLINICAL DATA:  Fall history of left hip fracture surgery EXAM: DG HIP (WITH OR WITHOUT PELVIS) 2-3V LEFT COMPARISON:  03/16/2019 FINDINGS: Bilateral hip replacements with normal alignment and no fracture. Pubic symphysis and rami are intact. No SI joint widening. Small amount of gas within the subcutaneous soft tissues overlying the left trochanter. IMPRESSION: 1. Bilateral hip replacements without acute osseous abnormality 2. Small amount of soft tissue gas within the lateral soft tissues of left hip. This would be unusual given that surgery was almost a month ago. Correlate for any history of recent drain removal or for soft tissue injury in the region on physical exam. Electronically Signed   By: Donavan Foil M.D.   On: 04/13/2019 20:46   Imaging studies reviewed, CT negative for acute intracranial injury.  ____________________________________________   PROCEDURES  Procedure(s) performed: None  Procedures  Critical Care performed: No   ____________________________________________   INITIAL IMPRESSION / ASSESSMENT AND PLAN / ED COURSE  Pertinent labs & imaging results that were available during my care of the patient were reviewed by me and considered in my medical decision making (see chart for details).   Patient presents somnolent, concerned and fend for evaluation after opening of her left hip wound.  I discussed this with Dr. Marry Guan, he will see in consult on the patient.  She is at a facility that is thought to have high incidence of Covid, Covid test has been ordered.  Due to her somnolence which  appears to be a recurring issue, a medical work-up is also ordered.  There is no obvious evidence of head trauma, head CT is performed.  Clinical Course as of Apr 12 2130  Wed Apr 13, 2019  1956 Discussed case with Dr. Marry Guan, he is coming to see the patient in the ER.   [MQ]    Clinical Course User Index [MQ] Delman Kitten, MD   [name] was evaluated in Emergency Department on 04/13/2019 for the symptoms described in the history of present illness. She was evaluated in the context of the global COVID-19 pandemic, which necessitated consideration that the patient might be at risk for infection with the SARS-CoV-2 virus that causes COVID-19. Institutional protocols and algorithms that pertain to the evaluation of patients at risk for COVID-19 are in a state of rapid change based on information released by regulatory bodies including the CDC and federal and state organizations. These policies and algorithms were followed during the patient's care in the ED.  ----------------------------------------- 9:31 PM on 04/13/2019 -----------------------------------------  Patient be admitted to the hospitalist service, Dr. Marry Guan has seen the patient and further treatment and care with Ortho consulting ____________________________________________   FINAL CLINICAL IMPRESSION(S) / ED DIAGNOSES  Final diagnoses:  Wound dehiscence  Somnolence  Hypokalemia        Note:  This document was prepared using Dragon voice recognition software and may include unintentional dictation errors       Delman Kitten, MD 04/13/19 2155

## 2019-04-13 NOTE — ED Notes (Signed)
Pt to ct 

## 2019-04-13 NOTE — H&P (Signed)
[name] is an 63 y.o. female.   Chief Complaint: Fall HPI: The patient with past medical history of neuropathy, Parkinson's syndrome, recent UTI and history of multiple falls presents to the emergency department from her nursing home following a fall.  Hip replacement surgery.  She suffered wound dehiscence from this fall.  Is unclear if the patient hit her head but CT was negative for intracranial bleeding, fracture or ischemia.  The patient was also reportedly more confused than usual although it is important to note that her mental status changes from lucid albeit belligerent to somewhat disjointed thinking.  She was found to have acute kidney injury on laboratory evaluation.  She was started on fluid in the emergency department prior to the hospitalist service being called for admission.  Past Medical History:  Diagnosis Date  . Anxiety   . Anxiety    panic anxiety disorder  . Arthritis   . Asthma    chronic asthmatic bronchitis  . Bipolar disorder ([place])   . Cancer [facility])    Desmoid tumor left forearm  . Depression   . ETOH abuse   . Heart murmur   . Insomnia   . Neuropathy     Past Surgical History:  Procedure Laterality Date  . ABDOMINAL HYSTERECTOMY    . ABDOMINAL HYSTERECTOMY  2007  . 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 ARM AMPUTATION AT SHOULDER Left   . CESAREAN SECTION    . [place]   x 2  . COLONOSCOPY    . ESOPHAGOGASTRODUODENOSCOPY (EGD) WITH PROPOFOL N/A 04/27/2018   Procedure: ESOPHAGOGASTRODUODENOSCOPY (EGD) WITH PROPOFOL;  Surgeon: Toledo, Benay Pike, MD;  Location: [facility] ENDOSCOPY;  Service: Gastroenterology;  Laterality: N/A;  . JOINT REPLACEMENT Right 2012   hip  . LUMBAR LAMINECTOMY/DECOMPRESSION MICRODISCECTOMY Left 04/16/2016   Procedure: LUMBAR LAMINECTOMY/DECOMPRESSION MICRODISCECTOMY 1 LEVEL;  Surgeon: Meade Maw, MD;  Location: [facility] ORS;  Service: Neurosurgery;  Laterality: Left;  Left L4-5 far  lateral discectomy, left L4-5 laminoforaminotomy  . TOTAL HIP ARTHROPLASTY Left 09/17/2015   Procedure: TOTAL HIP ARTHROPLASTY;  Surgeon: Dereck Leep, MD;  Location: [facility] ORS;  Service: Orthopedics;  Laterality: Left;  . TOTAL HIP REVISION Left 03/16/2019   Procedure: TOTAL HIP REVISION ARTHROPLASTY WITH CONVERSION TO A CONSTRAINED LINER.;  Surgeon: Dereck Leep, MD;  Location: [facility] ORS;  Service: Orthopedics;  Laterality: Left;  . WISDOM TOOTH EXTRACTION      Family History  Problem Relation Age of Onset  . COPD Mother    Social History:  reports that she has been smoking cigarettes. She has been smoking about 0.25 packs per day. She has never used smokeless tobacco. She reports current alcohol use. She reports that she does not use drugs.  Allergies:  Allergies  Allergen Reactions  . Benadryl [Diphenhydramine] Hives and Other (See Comments)    Reaction:  Hyperactivity   . Benadryl [Diphenhydramine] Other (See Comments)    My jaw locked and I could not speak, like a stroke.  . Cephalosporins Hives  . Codeine Nausea And Vomiting    Prior to Admission medications   Medication Sig Start Date End Date Taking? Authorizing Provider  acetaminophen (TYLENOL) 500 MG tablet Take 1,000 mg by mouth every 6 (six) hours as needed for mild pain or fever.    Yes [provider]  Amantadine HCl 100 MG tablet Take 200 mg by mouth 2 (two) times daily.  01/05/19  Yes  [provider]  benztropine (COGENTIN) 1 MG tablet Take 1 tablet (1 mg total) by mouth at bedtime for 7 days. 03/26/19 04/13/19 Yes Gregor Hams, MD  calcium carbonate (OS-CAL) 600 MG TABS tablet Take 600 mg by mouth daily with breakfast.   Yes [provider]  carbidopa-levodopa (SINEMET IR) 25-100 MG tablet Take 3 tablets by mouth 3 (three) times daily.    Yes [provider]  celecoxib (CELEBREX) 200 MG capsule Take 1 capsule (200 mg total) by mouth 2 (two) times daily. 03/17/19  Yes Reche Dixon, PA-C  chlordiazePOXIDE (LIBRIUM) 25 MG capsule Take 25 mg by mouth 3 (three) times daily.   Yes [provider]  cyclobenzaprine (FLEXERIL) 5 MG tablet Take 5 mg by mouth 3 (three) times daily as needed for muscle spasms.   Yes [provider]  gabapentin (NEURONTIN) 300 MG capsule Take 3 capsules (900 mg total) by mouth 3 (three) times daily. 05/21/17  Yes Pucilowska, [name], MD  nitrofurantoin, macrocrystal-monohydrate, (MACROBID) 100 MG capsule Take 1 capsule (100 mg total) by mouth every 12 (twelve) hours for 14 days. 04/05/19 04/19/19 Yes Epifanio Lesches, MD  oxyCODONE (OXY IR/ROXICODONE) 5 MG immediate release tablet Take 1 tablet (5 mg total) by mouth every 4 (four) hours as needed for moderate pain (pain score 4-6). 04/09/19  Yes Henreitta Leber, MD  propranolol (INDERAL) 10 MG tablet Take 1 tablet (10 mg total) by mouth 3 (three) times daily. Patient taking differently: Take 10 mg by mouth 3 (three) times daily.  05/21/17  Yes Pucilowska, [name], MD  traMADol (ULTRAM) 50 MG tablet Take 1-2 tablets (50-100 mg total) by mouth every 4 (four) hours as needed for moderate pain. 04/09/19  Yes Henreitta Leber, MD  traZODone (DESYREL) 150 MG tablet Take 150 mg by mouth at bedtime as needed for sleep.    Yes [provider]  venlafaxine XR (EFFEXOR-XR) 150 MG 24 hr capsule Take 2 capsules (300 mg total) by mouth daily with breakfast. 11/29/18  Yes Fritzi Mandes, MD     Results for orders placed or performed during the hospital encounter of 04/13/19 (from the past 48 hour(s))  Urinalysis, Complete w Microscopic     Status: Abnormal   Collection Time: 04/13/19  8:14 PM  Result Value Ref Range   Color, Urine AMBER (A) YELLOW    Comment: BIOCHEMICALS MAY BE AFFECTED BY COLOR   APPearance HAZY (A) CLEAR   Specific Gravity, Urine 1.024 1.005 - 1.030   pH 5.0 5.0 - 8.0   Glucose, UA NEGATIVE NEGATIVE mg/dL   Hgb urine dipstick NEGATIVE NEGATIVE   Bilirubin  Urine NEGATIVE NEGATIVE   Ketones, ur 20 (A) NEGATIVE mg/dL   Protein, ur 30 (A) NEGATIVE mg/dL   Nitrite NEGATIVE NEGATIVE   Leukocytes,Ua NEGATIVE NEGATIVE   RBC / HPF 0-5 0 - 5 RBC/hpf   WBC, UA 0-5 0 - 5 WBC/hpf   Bacteria, UA NONE SEEN NONE SEEN   Squamous Epithelial / LPF 0-5 0 - 5   Mucus PRESENT    Hyaline Casts, UA PRESENT     Comment: Performed at [facility], [address] [place], [place] [address]  CBC     Status: Abnormal   Collection Time: 04/13/19  8:14 PM  Result Value Ref Range   WBC 12.2 (H) 4.0 - 10.5 K/uL   RBC 3.61 (L) 3.87 - 5.11 MIL/uL   Hemoglobin 11.1 (L) 12.0 - 15.0 g/dL   HCT  35.0 (L) 36.0 - 46.0 %   MCV 97.0 80.0 - 100.0 fL   MCH 30.7 26.0 - 34.0 pg   MCHC 31.7 30.0 - 36.0 g/dL   RDW 14.4 11.5 - 15.5 %   Platelets 224 150 - 400 K/uL   nRBC 0.0 0.0 - 0.2 %    Comment: Performed at [facility], [address] [place], [place] [id number]  Basic metabolic panel     Status: Abnormal   Collection Time: 04/13/19  8:14 PM  Result Value Ref Range   Sodium 142 135 - 145 mmol/L   Potassium 3.0 (L) 3.5 - 5.1 mmol/L   Chloride 107 98 - 111 mmol/L   CO2 22 22 - 32 mmol/L   Glucose, Bld 84 70 - 99 mg/dL   BUN 26 (H) 8 - 23 mg/dL   Creatinine, Ser 1.29 (H) 0.44 - 1.00 mg/dL   Calcium 9.9 8.9 - 10.3 mg/dL   GFR calc non Af Amer 44 (L) >60 mL/min   GFR calc Af Amer 51 (L) >60 mL/min   Anion gap 13 5 - 15    Comment: Performed at [facility], [address] [place], [place] [address]  SARS Coronavirus 2 by RT PCR (hospital order, performed in [name] hospital lab) Nasopharyngeal Nasopharyngeal Swab     Status: None   Collection Time: 04/13/19  8:14 PM   Specimen: Nasopharyngeal Swab  Result Value Ref Range   SARS Coronavirus 2 NEGATIVE NEGATIVE    Comment: (NOTE) If result is NEGATIVE SARS-CoV-2 target nucleic acids are NOT DETECTED. The SARS-CoV-2 RNA is generally detectable in upper and lower  respiratory specimens  during the acute phase of infection. The lowest  concentration of SARS-CoV-2 viral copies this assay can detect is 250  copies / mL. A negative result does not preclude SARS-CoV-2 infection  and should not be used as the sole basis for treatment or other  patient management decisions.  A negative result may occur with  improper specimen collection / handling, submission of specimen other  than nasopharyngeal swab, presence of viral mutation(s) within the  areas targeted by this assay, and inadequate number of viral copies  (<250 copies / mL). A negative result must be combined with clinical  observations, patient history, and epidemiological information. If result is POSITIVE SARS-CoV-2 target nucleic acids are DETECTED. The SARS-CoV-2 RNA is generally detectable in upper and lower  respiratory specimens dur ing the acute phase of infection.  Positive  results are indicative of active infection with SARS-CoV-2.  Clinical  correlation with patient history and other diagnostic information is  necessary to determine patient infection status.  Positive results do  not rule out bacterial infection or co-infection with other viruses. If result is PRESUMPTIVE POSTIVE SARS-CoV-2 nucleic acids MAY BE PRESENT.   A presumptive positive result was obtained on the submitted specimen  and confirmed on repeat testing.  While 2019 novel coronavirus  (SARS-CoV-2) nucleic acids may be present in the submitted sample  additional confirmatory testing may be necessary for epidemiological  and / or clinical management purposes  to differentiate between  SARS-CoV-2 and other Sarbecovirus currently known to infect humans.  If clinically indicated additional testing with an alternate test  methodology [phone]) is advised. The SARS-CoV-2 RNA is generally  detectable in upper and lower respiratory sp ecimens during the acute  phase of infection. The expected result is Negative. Fact Sheet for Patients:   StrictlyIdeas.no Fact Sheet for Healthcare Providers: BankingDealers.co.za This  test is not yet approved or cleared by the [place] and has been authorized for detection and/or diagnosis of SARS-CoV-2 by FDA under an Emergency Use Authorization (EUA).  This EUA will remain in effect (meaning this test can be used) for the duration of the COVID-19 declaration under Section 564(b)(1) of the Act, 21 U.S.C. section 360bbb-3(b)(1), unless the authorization is terminated or revoked sooner. Performed at [facility], [address] [place], [place] [address]   Glucose, capillary     Status: None   Collection Time: 04/13/19  8:55 PM  Result Value Ref Range   Glucose-Capillary 73 70 - 99 mg/dL   Ct Head Wo Contrast  Result Date: 04/13/2019 CLINICAL DATA:  Head trauma secondary to a fall today. EXAM: CT HEAD WITHOUT CONTRAST TECHNIQUE: Contiguous axial images were obtained from the base of the skull through the vertex without intravenous contrast. COMPARISON:  CT scan dated 03/31/2019 FINDINGS: Brain: No evidence of acute infarction, hemorrhage, hydrocephalus, extra-axial collection or mass lesion/mass effect. Vascular: No hyperdense vessel or unexpected calcification. Skull: Normal. Negative for fracture or focal lesion. Sinuses/Orbits: Normal. Other: None IMPRESSION: Normal exam. Electronically Signed   By: Lorriane Shire M.D.   On: 04/13/2019 20:07   Dg Hip Unilat W Or Wo Pelvis 2-3 Views Left  Result Date: 04/13/2019 CLINICAL DATA:  Fall history of left hip fracture surgery EXAM: DG HIP (WITH OR WITHOUT PELVIS) 2-3V LEFT COMPARISON:  03/16/2019 FINDINGS: Bilateral hip replacements with normal alignment and no fracture. Pubic symphysis and rami are intact. No SI joint widening. Small amount of gas within the subcutaneous soft tissues overlying the left trochanter. IMPRESSION: 1. Bilateral hip replacements without acute osseous  abnormality 2. Small amount of soft tissue gas within the lateral soft tissues of left hip. This would be unusual given that surgery was almost a month ago. Correlate for any history of recent drain removal or for soft tissue injury in the region on physical exam. Electronically Signed   By: Donavan Foil M.D.   On: 04/13/2019 20:46    Review of Systems  Unable to perform ROS: Mental status change  Musculoskeletal: Positive for falls.    Blood pressure 125/60, pulse 63, temperature 98.4 F (36.9 C), temperature source Oral, resp. rate 12, height 5\' 5"  (1.651 m), weight 74.8 kg, SpO2 97 %. Physical Exam  Vitals reviewed. Constitutional: She is oriented to person, place, and time. She appears well-developed and well-nourished. No distress.  HENT:  Head: Normocephalic and atraumatic.  Mouth/Throat: Oropharynx is clear and moist.  Eyes: Pupils are equal, round, and reactive to light. Conjunctivae and EOM are normal. No scleral icterus.  Neck: Normal range of motion. Neck supple. No JVD present. No tracheal deviation present. No thyromegaly present.  Cardiovascular: Normal rate, regular rhythm and normal heart sounds. Exam reveals no gallop and no friction rub.  No murmur heard. Respiratory: Effort normal and breath sounds normal.  GI: Soft. Bowel sounds are normal. She exhibits no distension. There is no abdominal tenderness.  Musculoskeletal: Normal range of motion.        General: No edema.  Lymphadenopathy:    She has no cervical adenopathy.  Neurological: She is alert and oriented to person, place, and time. No cranial nerve deficit. She exhibits normal muscle tone.  Skin: Skin is warm and dry. No rash noted. No erythema.  Psychiatric: She has a normal mood and affect. Judgment and thought content normal.     Assessment/Plan This is a 63 year old female  admitted for acute kidney injury. 1.  AKI: Prerenal; hydrate with intravenous fluid.  Encourage p.o. intake once she is cleared to  eat postoperatively.  Avoid nephrotoxic agents.  Hold Celebrex and tramadol. 2.  Confusion: Multifactorial including dehydration, labile mood, Parkinson's dementia and  concussion following possible head injury.  (CT head negative for acute intracranial process).  Hold potentially sedative medication such as Librium, Flexeril and Neurontin.  Monitor mental status for return to baseline.  The patient is well-known to the orthopedic service. 3.  Hypokalemia: Replete potassium 4.  Fall: With wound dehiscence.  PT/OT for safety eval prior to discharge.  Administer home dose of propranolol prior to surgery.  She is low risk for non-thoracic surgery. 5.  Parkinson's disease: Continue amantadine and Sinemet 6.  Mood disorder: Continue trazodone and Cogentin. 7.  UTI: Resolving; complete Macrobid course started prior to admission 8.  DVT prophylaxis: SCDs 9.  GI prophylaxis: None The patient is a full code.  Time spent on admission orders and patient care approximately 45 minutes  Harrie Foreman, MD 04/13/2019, 9:38 PM

## 2019-04-13 NOTE — ED Notes (Signed)
Unable to contact son or daughter. Let message for son. DR Marry Guan is here now.

## 2019-04-13 NOTE — ED Notes (Signed)
DR Marcille Blanco in with pt.

## 2019-04-13 NOTE — Anesthesia Preprocedure Evaluation (Addendum)
Anesthesia Evaluation  Patient identified by MRN, date of birth, ID band Patient awake    Reviewed: Allergy & Precautions, H&P , NPO status , Patient's Chart, lab work & pertinent test results  Airway Mallampati: III  TM Distance: >3 FB    Comment: Limited effort to comply with mallampati exam Dental  (+) Chipped, Missing   Pulmonary asthma , Current Smoker,           Cardiovascular hypertension, (-) angina(-) Past MI, (-) Cardiac Stents and (-) CABG (-) dysrhythmias      Neuro/Psych PSYCHIATRIC DISORDERS Anxiety Depression Bipolar Disorder Parkinson's disease AMS, likely multifactorial, with dehydration, Parkinson's dementia, and h/o recent metabolic encephalopathy (discharged 04/09/19) attributed to AKI, UTI, and alcohol withdrawal, unclear if also suffered concussion in fall (no intracranial process on head CT) Mood disorder Neuropathy    GI/Hepatic negative GI ROS, (+)     substance abuse  alcohol use,   Endo/Other  negative endocrine ROS  Renal/GU Renal disease (AKI)     Musculoskeletal  (+) Arthritis ,   Abdominal   Peds  Hematology negative hematology ROS (+)   Anesthesia Other Findings Past Medical History: No date: Anxiety No date: Anxiety     Comment:  panic anxiety disorder No date: Arthritis No date: Asthma     Comment:  chronic asthmatic bronchitis No date: Bipolar disorder ([place]) No date: Cancer ([place])     Comment:  Desmoid tumor left forearm No date: Depression No date: ETOH abuse No date: Heart murmur No date: Insomnia No date: Neuropathy  Past Surgical History: No date: ABDOMINAL HYSTERECTOMY 2007: ABDOMINAL HYSTERECTOMY 1998: ARM AMPUTATION; Left     Comment:  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 No date: ARM AMPUTATION AT SHOULDER; Left No date: [place]: CESAREAN SECTION     Comment:  x 2 No date: COLONOSCOPY 04/27/2018: ESOPHAGOGASTRODUODENOSCOPY (EGD) WITH PROPOFOL;  N/A     Comment:  Procedure: ESOPHAGOGASTRODUODENOSCOPY (EGD) WITH               PROPOFOL;  Surgeon: Toledo, Benay Pike, MD;  Location:               [facility];  Service: Gastroenterology;  Laterality:               N/A; 2012: JOINT REPLACEMENT; Right     Comment:  hip 04/16/2016: LUMBAR LAMINECTOMY/DECOMPRESSION MICRODISCECTOMY; Left     Comment:  Procedure: LUMBAR LAMINECTOMY/DECOMPRESSION               MICRODISCECTOMY 1 LEVEL;  Surgeon: Meade Maw, MD;              Location: [facility] ORS;  Service: Neurosurgery;  Laterality:               Left;  Left L4-5 far lateral discectomy, left L4-5               laminoforaminotomy 09/17/2015: TOTAL HIP ARTHROPLASTY; Left     Comment:  Procedure: TOTAL HIP ARTHROPLASTY;  Surgeon: Dereck Leep, MD;  Location: [facility] ORS;  Service: Orthopedics;                Laterality: Left; No date: WISDOM TOOTH EXTRACTION  BMI    Body Mass Index: 23.48 kg/m      Reproductive/Obstetrics negative OB ROS  Anesthesia Physical  Anesthesia Plan  ASA: III  Anesthesia Plan: General ETT   Post-op Pain Management:    Induction:   PONV Risk Score and Plan: Ondansetron, Dexamethasone, Midazolam and Treatment may vary due to age or medical condition  Airway Management Planned:   Additional Equipment:   Intra-op Plan:   Post-operative Plan:   Informed Consent: I have reviewed the patients History and Physical, chart, labs and discussed the procedure including the risks, benefits and alternatives for the proposed anesthesia with the patient or authorized representative who has indicated his/her understanding and acceptance.     Dental Advisory Given  Plan Discussed with: Anesthesiologist, CRNA and Surgeon  Anesthesia Plan Comments: (Phone consent obtained via son [name].  KLF)       Anesthesia Quick Evaluation

## 2019-04-13 NOTE — ED Notes (Signed)
Spoke With [name] the daughter. Telephone number is [phone]. Daughter

## 2019-04-13 NOTE — ED Triage Notes (Addendum)
Pt comes into the ED via EMS from [facility] health care,. Pt was there for rehab after a fall with hip fx and surgery. Pt states she fell today and has opened the surgical site again with clean abd pads in place.the patient has a hx of dementia pt removed her shirt at the front desk while triaging the pt. Pt shirt reapplied and pt taken to room 6 to finish the triage

## 2019-04-13 NOTE — Progress Notes (Signed)
ORTHOPAEDICS: I contacted the patient's son [name] and informed him of her condition. I recommended irrigation and debridement of the left hip wound. He was in agreement.  Brynlie Daza P. Holley Bouche M.D.

## 2019-04-13 NOTE — ED Notes (Signed)
Spoke with Adedeja RN at the rehab facility. :ast known PO intake was at lunch. Pt did not eat dinner. Dr Marry Guan made aware.

## 2019-04-13 NOTE — Consult Note (Signed)
ORTHOPAEDIC CONSULTATION  PATIENT NAME: [name] DOB: [birth year]  MRN: [id number]  REQUESTING PHYSICIAN: Delman Kitten, MD  Chief Complaint: Left hip wound dehiscence  HPI: [name] is a 63 y.o. female who underwent left hip revision surgery on 03/16/2019.  She had done well perioperatively.  She was recently admitted to the hospital (03/31/2019) for altered mental status secondary to metabolic encephalopathy from acute kidney injury, UTI, and alcohol withdrawal.  She was apparently discharged to [facility].  She reportedly sustained a fall today at the facility and was noted to have dehiscence of a portion of the surgical incision.  Past Medical History:  Diagnosis Date  . Anxiety   . Anxiety    panic anxiety disorder  . Arthritis   . Asthma    chronic asthmatic bronchitis  . Bipolar disorder ([place])   . Cancer [facility])    Desmoid tumor left forearm  . Depression   . ETOH abuse   . Heart murmur   . Insomnia   . Neuropathy    Past Surgical History:  Procedure Laterality Date  . ABDOMINAL HYSTERECTOMY    . ABDOMINAL HYSTERECTOMY  2007  . 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 ARM AMPUTATION AT SHOULDER Left   . CESAREAN SECTION    . [place]   x 2  . COLONOSCOPY    . ESOPHAGOGASTRODUODENOSCOPY (EGD) WITH PROPOFOL N/A 04/27/2018   Procedure: ESOPHAGOGASTRODUODENOSCOPY (EGD) WITH PROPOFOL;  Surgeon: Toledo, Benay Pike, MD;  Location: [facility] ENDOSCOPY;  Service: Gastroenterology;  Laterality: N/A;  . JOINT REPLACEMENT Right 2012   hip  . LUMBAR LAMINECTOMY/DECOMPRESSION MICRODISCECTOMY Left 04/16/2016   Procedure: LUMBAR LAMINECTOMY/DECOMPRESSION MICRODISCECTOMY 1 LEVEL;  Surgeon: Meade Maw, MD;  Location: [facility] ORS;  Service: Neurosurgery;  Laterality: Left;  Left L4-5 far lateral discectomy, left L4-5 laminoforaminotomy  . TOTAL HIP ARTHROPLASTY Left 09/17/2015   Procedure: TOTAL HIP ARTHROPLASTY;  Surgeon:  Dereck Leep, MD;  Location: [facility] ORS;  Service: Orthopedics;  Laterality: Left;  . TOTAL HIP REVISION Left 03/16/2019   Procedure: TOTAL HIP REVISION ARTHROPLASTY WITH CONVERSION TO A CONSTRAINED LINER.;  Surgeon: Dereck Leep, MD;  Location: [facility] ORS;  Service: Orthopedics;  Laterality: Left;  . WISDOM TOOTH EXTRACTION     Social History   Socioeconomic History  . Marital status: Divorced    Spouse name: Not on file  . Number of children: Not on file  . Years of education: Not on file  . Highest education level: Not on file  Occupational History  . Not on file  Social Needs  . Financial resource strain: Not on file  . Food insecurity    Worry: Not on file    Inability: Not on file  . Transportation needs    Medical: Not on file    Non-medical: Not on file  Tobacco Use  . Smoking status: Current Every Day Smoker    Packs/day: 0.25    Types: Cigarettes  . Smokeless tobacco: Never Used  Substance and Sexual Activity  . Alcohol use: Yes    Comment: 2 40oz beers daily  . Drug use: No    Comment: Patient denies   . Sexual activity: Yes  Lifestyle  . Physical activity    Days per week: Not on file    Minutes per session: Not on file  . Stress: Not on file  Relationships  . Social Herbalist on  phone: Not on file    Gets together: Not on file    Attends religious service: Not on file    Active member of club or organization: Not on file    Attends meetings of clubs or organizations: Not on file    Relationship status: Not on file  Other Topics Concern  . Not on file  Social History Narrative   ** Merged History Encounter **       Family History  Problem Relation Age of Onset  . COPD Mother    Allergies  Allergen Reactions  . Benadryl [Diphenhydramine] Hives and Other (See Comments)    Reaction:  Hyperactivity   . Benadryl [Diphenhydramine] Other (See Comments)    My jaw locked and I could not speak, like a stroke.  . Cephalosporins Hives  .  Codeine Nausea And Vomiting   Prior to Admission medications   Medication Sig Start Date End Date Taking? Authorizing Provider  acetaminophen (TYLENOL) 500 MG tablet Take 1,000 mg by mouth every 6 (six) hours as needed for mild pain or fever.    Yes [provider]  Amantadine HCl 100 MG tablet Take 200 mg by mouth 2 (two) times daily.  01/05/19  Yes [provider]  benztropine (COGENTIN) 1 MG tablet Take 1 tablet (1 mg total) by mouth at bedtime for 7 days. 03/26/19 04/13/19 Yes Gregor Hams, MD  calcium carbonate (OS-CAL) 600 MG TABS tablet Take 600 mg by mouth daily with breakfast.   Yes [provider]  carbidopa-levodopa (SINEMET IR) 25-100 MG tablet Take 3 tablets by mouth 3 (three) times daily.    Yes [provider]  celecoxib (CELEBREX) 200 MG capsule Take 1 capsule (200 mg total) by mouth 2 (two) times daily. 03/17/19  Yes Reche Dixon, PA-C  chlordiazePOXIDE (LIBRIUM) 25 MG capsule Take 25 mg by mouth 3 (three) times daily.   Yes [provider]  cyclobenzaprine (FLEXERIL) 5 MG tablet Take 5 mg by mouth 3 (three) times daily as needed for muscle spasms.   Yes [provider]  gabapentin (NEURONTIN) 300 MG capsule Take 3 capsules (900 mg total) by mouth 3 (three) times daily. 05/21/17  Yes Pucilowska, [name], MD  nitrofurantoin, macrocrystal-monohydrate, (MACROBID) 100 MG capsule Take 1 capsule (100 mg total) by mouth every 12 (twelve) hours for 14 days. 04/05/19 04/19/19 Yes Epifanio Lesches, MD  oxyCODONE (OXY IR/ROXICODONE) 5 MG immediate release tablet Take 1 tablet (5 mg total) by mouth every 4 (four) hours as needed for moderate pain (pain score 4-6). 04/09/19  Yes Henreitta Leber, MD  propranolol (INDERAL) 10 MG tablet Take 1 tablet (10 mg total) by mouth 3 (three) times daily. Patient taking differently: Take 10 mg by mouth 3 (three) times daily.  05/21/17  Yes Pucilowska, [name], MD  traMADol (ULTRAM) 50 MG tablet  Take 1-2 tablets (50-100 mg total) by mouth every 4 (four) hours as needed for moderate pain. 04/09/19  Yes Henreitta Leber, MD  traZODone (DESYREL) 150 MG tablet Take 150 mg by mouth at bedtime as needed for sleep.    Yes [provider]  venlafaxine XR (EFFEXOR-XR) 150 MG 24 hr capsule Take 2 capsules (300 mg total) by mouth daily with breakfast. 11/29/18  Yes Fritzi Mandes, MD   Ct Head Wo Contrast  Result Date: 04/13/2019 CLINICAL DATA:  Head trauma secondary to a fall today. EXAM: CT HEAD WITHOUT CONTRAST TECHNIQUE: Contiguous axial images were obtained from the base of the  skull through the vertex without intravenous contrast. COMPARISON:  CT scan dated 03/31/2019 FINDINGS: Brain: No evidence of acute infarction, hemorrhage, hydrocephalus, extra-axial collection or mass lesion/mass effect. Vascular: No hyperdense vessel or unexpected calcification. Skull: Normal. Negative for fracture or focal lesion. Sinuses/Orbits: Normal. Other: None IMPRESSION: Normal exam. Electronically Signed   By: Lorriane Shire M.D.   On: 04/13/2019 20:07   Dg Hip Unilat W Or Wo Pelvis 2-3 Views Left  Result Date: 04/13/2019 CLINICAL DATA:  Fall history of left hip fracture surgery EXAM: DG HIP (WITH OR WITHOUT PELVIS) 2-3V LEFT COMPARISON:  03/16/2019 FINDINGS: Bilateral hip replacements with normal alignment and no fracture. Pubic symphysis and rami are intact. No SI joint widening. Small amount of gas within the subcutaneous soft tissues overlying the left trochanter. IMPRESSION: 1. Bilateral hip replacements without acute osseous abnormality 2. Small amount of soft tissue gas within the lateral soft tissues of left hip. This would be unusual given that surgery was almost a month ago. Correlate for any history of recent drain removal or for soft tissue injury in the region on physical exam. Electronically Signed   By: Donavan Foil M.D.   On: 04/13/2019 20:46    Positive ROS: All other systems have been  reviewed and were otherwise negative with the exception of those mentioned in the HPI and as above.  Physical Exam: General: Well developed, well nourished female seen in no acute distress. HEENT: Atraumatic and normocephalic. Sclera are clear. Extraocular motion is intact. Oropharynx is clear with moist mucosa. Neck: Supple, nontender, good range of motion. No JVD or carotid bruits. Lungs: Clear to auscultation bilaterally. Cardiovascular: Regular rate and rhythm with normal S1 and S2. No murmurs. No gallops or rubs. Pedal pulses are palpable bilaterally. No significant pretibial or ankle edema. Abdomen: Soft, nontender, and nondistended. Bowel sounds are present. Skin: No lesions in the area of chief complaint Neurologic: Lethargic and difficult to arouse.  Sensory and motor function were difficult to assess due to the patient's mental status. Lymphatic: No axillary or cervical lymphadenopathy  MUSCULOSKELETAL: The patient is status post amputation of the left upper extremity. Examination of the left lower extremity shows most of the previous surgical incision to the hip to be well-healed.  However, there is an area of approximately 3 inches with gross dehiscence.  No active bleeding.  No surrounding erythema.  Gentle range of motion of the hip is well-tolerated.  Assessment: Traumatic dehiscence of the left hip incision Altered mental status  Plan: Attempts have been made to contact the patient's family.  I recommend irrigation and debridement of the site with attempted primary closure.  This is an urgent situation to prevent secondary infection.  The patient is being fully evaluated by Medicine for the altered mental status.   P. Holley Bouche M.D.

## 2019-04-13 NOTE — ED Notes (Signed)
Spoke with son [name] who gave verbal consent for the OR. Sent a message to Dr Marry Guan.

## 2019-04-14 ENCOUNTER — Encounter: Payer: Self-pay | Admitting: Orthopedic Surgery

## 2019-04-14 LAB — BASIC METABOLIC PANEL
Anion gap: 13 (ref 5–15)
BUN: 18 mg/dL (ref 8–23)
CO2: 18 mmol/L — ABNORMAL LOW (ref 22–32)
Calcium: 8.6 mg/dL — ABNORMAL LOW (ref 8.9–10.3)
Chloride: 111 mmol/L (ref 98–111)
Creatinine, Ser: 0.88 mg/dL (ref 0.44–1.00)
GFR calc Af Amer: >60 mL/min (ref >60)
GFR calc non Af Amer: >60 mL/min (ref >60)
Glucose, Bld: 139 mg/dL — ABNORMAL HIGH (ref 70–99)
Potassium: 4.6 mmol/L (ref 3.5–5.1)
Sodium: 142 mmol/L (ref 135–145)

## 2019-04-14 LAB — CBC
HCT: 31 % — ABNORMAL LOW (ref 36.0–46.0)
Hemoglobin: 10 g/dL — ABNORMAL LOW (ref 12.0–15.0)
MCH: 30.8 pg (ref 26.0–34.0)
MCHC: 32.3 g/dL (ref 30.0–36.0)
MCV: 95.4 fL (ref 80.0–100.0)
Platelets: 192 10*3/uL (ref 150–400)
RBC: 3.25 MIL/uL — ABNORMAL LOW (ref 3.87–5.11)
RDW: 14.4 % (ref 11.5–15.5)
WBC: 5.7 10*3/uL (ref 4.0–10.5)
nRBC: 0 % (ref 0.0–0.2)

## 2019-04-14 LAB — TSH: TSH: 1.972 u[IU]/mL (ref 0.350–4.500)

## 2019-04-14 MED ORDER — METOCLOPRAMIDE HCL 5 MG PO TABS: 5.0000 mg | ORAL_TABLET | Freq: Three times a day (TID) | ORAL | Status: DC | PRN

## 2019-04-14 MED ORDER — DEXMEDETOMIDINE HCL 200 MCG/2ML IV SOLN
INTRAVENOUS | Status: DC | PRN
  Administered 2019-04-14: 12 ug via INTRAVENOUS

## 2019-04-14 MED ORDER — OXYCODONE HCL 5 MG PO TABS
10.0000 mg | ORAL_TABLET | ORAL | Status: DC | PRN
  Administered 2019-04-14 – 2019-04-16 (×5): 10 mg via ORAL
  Filled 2019-04-14 (×5): qty 2

## 2019-04-14 MED ORDER — ENOXAPARIN SODIUM 40 MG/0.4ML ~~LOC~~ SOLN
40.0000 mg | SUBCUTANEOUS | Status: DC
  Administered 2019-04-15 – 2019-04-18 (×2): 40 mg via SUBCUTANEOUS
  Filled 2019-04-14 (×3): qty 0.4

## 2019-04-14 MED ORDER — ACETAMINOPHEN 10 MG/ML IV SOLN
1000.0000 mg | Freq: Four times a day (QID) | INTRAVENOUS | Status: AC
  Administered 2019-04-14 (×4): 1000 mg via INTRAVENOUS
  Filled 2019-04-14 (×4): qty 100

## 2019-04-14 MED ORDER — SUCCINYLCHOLINE CHLORIDE 20 MG/ML IJ SOLN
INTRAMUSCULAR | Status: DC | PRN
  Administered 2019-04-13: 100 mg via INTRAVENOUS

## 2019-04-14 MED ORDER — METOCLOPRAMIDE HCL 5 MG/ML IJ SOLN: 5.0000 mg | Freq: Three times a day (TID) | INTRAMUSCULAR | Status: DC | PRN

## 2019-04-14 MED ORDER — FLEET ENEMA 7-19 GM/118ML RE ENEM: 1.0000 | ENEMA | Freq: Once | RECTAL | Status: DC | PRN

## 2019-04-14 MED ORDER — PHENOL 1.4 % MT LIQD
1.0000 | OROMUCOSAL | Status: DC | PRN
  Filled 2019-04-14: qty 177

## 2019-04-14 MED ORDER — ONDANSETRON HCL 4 MG PO TABS: 4.0000 mg | ORAL_TABLET | Freq: Four times a day (QID) | ORAL | Status: DC | PRN

## 2019-04-14 MED ORDER — DEXAMETHASONE SODIUM PHOSPHATE 10 MG/ML IJ SOLN
INTRAMUSCULAR | Status: AC
  Filled 2019-04-14: qty 1

## 2019-04-14 MED ORDER — TRAZODONE HCL 50 MG PO TABS
150.0000 mg | ORAL_TABLET | Freq: Every evening | ORAL | Status: DC | PRN
  Administered 2019-04-17: 150 mg via ORAL
  Filled 2019-04-14: qty 1

## 2019-04-14 MED ORDER — ONDANSETRON HCL 4 MG/2ML IJ SOLN
INTRAMUSCULAR | Status: DC | PRN
  Administered 2019-04-14: 4 mg via INTRAVENOUS

## 2019-04-14 MED ORDER — GABAPENTIN 400 MG PO CAPS
400.0000 mg | ORAL_CAPSULE | Freq: Three times a day (TID) | ORAL | Status: DC
  Administered 2019-04-14 – 2019-04-18 (×14): 400 mg via ORAL
  Filled 2019-04-14 (×14): qty 1

## 2019-04-14 MED ORDER — ACETAMINOPHEN 325 MG PO TABS: 325.0000 mg | ORAL_TABLET | Freq: Four times a day (QID) | ORAL | Status: DC | PRN

## 2019-04-14 MED ORDER — BENZTROPINE MESYLATE 1 MG PO TABS
1.0000 mg | ORAL_TABLET | Freq: Every day | ORAL | Status: DC
  Administered 2019-04-14 – 2019-04-17 (×4): 1 mg via ORAL
  Filled 2019-04-14 (×6): qty 1

## 2019-04-14 MED ORDER — AMANTADINE HCL 100 MG PO CAPS
200.0000 mg | ORAL_CAPSULE | Freq: Two times a day (BID) | ORAL | Status: DC
  Administered 2019-04-14 – 2019-04-18 (×9): 200 mg via ORAL
  Filled 2019-04-14 (×10): qty 2

## 2019-04-14 MED ORDER — VENLAFAXINE HCL ER 75 MG PO CP24
300.0000 mg | ORAL_CAPSULE | Freq: Once | ORAL | Status: AC
  Administered 2019-04-14: 300 mg via ORAL
  Filled 2019-04-14: qty 4

## 2019-04-14 MED ORDER — OXYCODONE HCL 5 MG PO TABS
5.0000 mg | ORAL_TABLET | ORAL | Status: DC | PRN
  Administered 2019-04-15: 5 mg via ORAL
  Filled 2019-04-14: qty 1

## 2019-04-14 MED ORDER — DOCUSATE SODIUM 100 MG PO CAPS
100.0000 mg | ORAL_CAPSULE | Freq: Two times a day (BID) | ORAL | Status: DC
  Administered 2019-04-14 – 2019-04-18 (×9): 100 mg via ORAL
  Filled 2019-04-14 (×9): qty 1

## 2019-04-14 MED ORDER — ONDANSETRON HCL 4 MG/2ML IJ SOLN: 4.0000 mg | Freq: Four times a day (QID) | INTRAMUSCULAR | Status: DC | PRN

## 2019-04-14 MED ORDER — BISACODYL 10 MG RE SUPP: 10.0000 mg | Freq: Every day | RECTAL | Status: DC | PRN

## 2019-04-14 MED ORDER — ALPRAZOLAM 0.5 MG PO TABS
0.5000 mg | ORAL_TABLET | Freq: Three times a day (TID) | ORAL | Status: DC | PRN
  Administered 2019-04-14 – 2019-04-16 (×4): 0.5 mg via ORAL
  Filled 2019-04-14 (×4): qty 1

## 2019-04-14 MED ORDER — MENTHOL 3 MG MT LOZG
1.0000 | LOZENGE | OROMUCOSAL | Status: DC | PRN
  Filled 2019-04-14: qty 9

## 2019-04-14 MED ORDER — SODIUM CHLORIDE 0.9 % IV SOLN
INTRAVENOUS | Status: DC
  Administered 2019-04-14: 16:00:00 via INTRAVENOUS

## 2019-04-14 MED ORDER — ONDANSETRON HCL 4 MG/2ML IJ SOLN
INTRAMUSCULAR | Status: AC
  Filled 2019-04-14: qty 2

## 2019-04-14 MED ORDER — POTASSIUM CHLORIDE IN NACL 40-0.9 MEQ/L-% IV SOLN
INTRAVENOUS | Status: DC
  Administered 2019-04-14: 125 mL/h via INTRAVENOUS
  Filled 2019-04-14 (×4): qty 1000

## 2019-04-14 MED ORDER — LIDOCAINE HCL (CARDIAC) PF 100 MG/5ML IV SOSY
PREFILLED_SYRINGE | INTRAVENOUS | Status: DC | PRN
  Administered 2019-04-13: 60 mg via INTRAVENOUS

## 2019-04-14 MED ORDER — ACETAMINOPHEN 650 MG RE SUPP: 650.0000 mg | Freq: Four times a day (QID) | RECTAL | Status: DC | PRN

## 2019-04-14 MED ORDER — NEOMYCIN-POLYMYXIN B GU 40-200000 IR SOLN
Status: DC | PRN
  Administered 2019-04-14: 4 mL

## 2019-04-14 MED ORDER — FENTANYL CITRATE (PF) 100 MCG/2ML IJ SOLN: 25.0000 ug | INTRAMUSCULAR | Status: DC | PRN

## 2019-04-14 MED ORDER — CLINDAMYCIN PHOSPHATE 600 MG/50ML IV SOLN
600.0000 mg | Freq: Four times a day (QID) | INTRAVENOUS | Status: AC
  Administered 2019-04-14 (×2): 600 mg via INTRAVENOUS
  Filled 2019-04-14 (×2): qty 50

## 2019-04-14 MED ORDER — DEXAMETHASONE SODIUM PHOSPHATE 10 MG/ML IJ SOLN
INTRAMUSCULAR | Status: DC | PRN
  Administered 2019-04-14: 10 mg via INTRAVENOUS

## 2019-04-14 MED ORDER — MAGNESIUM HYDROXIDE 400 MG/5ML PO SUSP: 30.0000 mL | Freq: Every day | ORAL | Status: DC | PRN

## 2019-04-14 MED ORDER — SENNOSIDES-DOCUSATE SODIUM 8.6-50 MG PO TABS
1.0000 | ORAL_TABLET | Freq: Two times a day (BID) | ORAL | Status: DC
  Administered 2019-04-14 – 2019-04-18 (×9): 1 via ORAL
  Filled 2019-04-14 (×9): qty 1

## 2019-04-14 MED ORDER — CHLORDIAZEPOXIDE HCL 25 MG PO CAPS: 25.0000 mg | ORAL_CAPSULE | Freq: Three times a day (TID) | ORAL | Status: DC

## 2019-04-14 MED ORDER — PROPRANOLOL HCL 10 MG PO TABS
10.0000 mg | ORAL_TABLET | Freq: Three times a day (TID) | ORAL | Status: DC
  Administered 2019-04-14 – 2019-04-18 (×12): 10 mg via ORAL
  Filled 2019-04-14 (×15): qty 1

## 2019-04-14 MED ORDER — CARBIDOPA-LEVODOPA 25-100 MG PO TABS
3.0000 | ORAL_TABLET | Freq: Three times a day (TID) | ORAL | Status: DC
  Administered 2019-04-14 – 2019-04-18 (×14): 3 via ORAL
  Filled 2019-04-14 (×15): qty 3

## 2019-04-14 MED ORDER — EPHEDRINE SULFATE 50 MG/ML IJ SOLN
INTRAMUSCULAR | Status: DC | PRN
  Administered 2019-04-14 (×3): 10 mg via INTRAVENOUS

## 2019-04-14 MED ORDER — GABAPENTIN 300 MG PO CAPS: 900.0000 mg | ORAL_CAPSULE | Freq: Three times a day (TID) | ORAL | Status: DC

## 2019-04-14 MED ORDER — PANTOPRAZOLE SODIUM 40 MG PO TBEC
40.0000 mg | DELAYED_RELEASE_TABLET | Freq: Two times a day (BID) | ORAL | Status: DC
  Administered 2019-04-14 – 2019-04-18 (×9): 40 mg via ORAL
  Filled 2019-04-14 (×9): qty 1

## 2019-04-14 MED ORDER — FENTANYL CITRATE (PF) 100 MCG/2ML IJ SOLN
INTRAMUSCULAR | Status: DC | PRN
  Administered 2019-04-13: 100 ug via INTRAVENOUS

## 2019-04-14 MED ORDER — NITROFURANTOIN MONOHYD MACRO 100 MG PO CAPS
100.0000 mg | ORAL_CAPSULE | Freq: Two times a day (BID) | ORAL | Status: DC
  Administered 2019-04-14 – 2019-04-16 (×5): 100 mg via ORAL
  Filled 2019-04-14 (×6): qty 1

## 2019-04-14 MED ORDER — PROPOFOL 10 MG/ML IV BOLUS
INTRAVENOUS | Status: DC | PRN
  Administered 2019-04-13: 130 mg via INTRAVENOUS
  Administered 2019-04-14: 70 mg via INTRAVENOUS

## 2019-04-14 MED ORDER — HYDROMORPHONE HCL 1 MG/ML IJ SOLN
0.5000 mg | INTRAMUSCULAR | Status: DC | PRN
  Administered 2019-04-14 – 2019-04-15 (×2): 1 mg via INTRAVENOUS
  Filled 2019-04-14 (×3): qty 1

## 2019-04-14 MED ORDER — LACTATED RINGERS IV SOLN
INTRAVENOUS | Status: DC | PRN
  Administered 2019-04-13: via INTRAVENOUS

## 2019-04-14 MED ORDER — NEOMYCIN-POLYMYXIN B GU 40-200000 IR SOLN
Status: DC | PRN
  Administered 2019-04-14: 12 mL

## 2019-04-14 MED ORDER — ACETAMINOPHEN 325 MG PO TABS: 650.0000 mg | ORAL_TABLET | Freq: Four times a day (QID) | ORAL | Status: DC | PRN

## 2019-04-14 MED ORDER — VENLAFAXINE HCL ER 75 MG PO CP24
300.0000 mg | ORAL_CAPSULE | Freq: Every day | ORAL | Status: DC
  Administered 2019-04-15 – 2019-04-18 (×4): 300 mg via ORAL
  Filled 2019-04-14 (×4): qty 4

## 2019-04-14 NOTE — Progress Notes (Signed)
  Subjective: 1 Day Post-Op Procedure(s) (LRB): INCISION AND DRAINAGE (Left) Patient reports pain as  5 on 0-10 scale.   Patient is well, and has had no acute complaints or problems Plan is to go Home after hospital stay. Negative for chest pain and shortness of breath Fever: no Gastrointestinal: negative for nausea and vomiting.  Patient has not had a bowel movement.  Objective: Vital signs in last 24 hours: Temp:  [97.1 F (36.2 C)-98.4 F (36.9 C)] 97.4 F (36.3 C) (10/22 0515) Pulse Rate:  [55-71] 64 (10/22 0515) Resp:  [10-19] 15 (10/22 0316) BP: (97-140)/(42-78) 112/56 (10/22 0431) SpO2:  [93 %-100 %] 99 % (10/22 0515) Weight:  [74.8 kg] 74.8 kg (10/21 1852)  Intake/Output from previous day:  Intake/Output Summary (Last 24 hours) at 04/14/2019 1156 Last data filed at 04/14/2019 1013 Gross per 24 hour  Intake 1183.15 ml  Output 600 ml  Net 583.15 ml    Intake/Output this shift: Total I/O In: -  Out: 300 [Urine:300]  Labs: Recent Labs    04/13/19 2014 04/14/19 0857  HGB 11.1* 10.0*   Recent Labs    04/13/19 2014 04/14/19 0857  WBC 12.2* 5.7  RBC 3.61* 3.25*  HCT 35.0* 31.0*  PLT 224 192   Recent Labs    04/13/19 2014 04/14/19 0857  NA 142 142  K 3.0* 4.6  CL 107 111  CO2 22 18*  BUN 26* 18  CREATININE 1.29* 0.88  GLUCOSE 84 139*  CALCIUM 9.9 8.6*   No results for input(s): LABPT, INR in the last 72 hours.   EXAM General - Patient is Alert, Appropriate and Oriented Extremity - Neurovascular intact Dorsiflexion/Plantar flexion intact Compartment soft Dressing/Incision -  Wound VAC intact.  No drainage noted Motor Function - intact, moving foot and toes well on exam.  Cardiovascular- Regular rate and rhythm, no murmurs/rubs/gallops Respiratory- Lungs clear to auscultation bilaterally Gastrointestinal- active bowel sounds   Assessment/Plan: 1 Day Post-Op Procedure(s) (LRB): INCISION AND DRAINAGE (Left) Active Problems:   AKI (acute  kidney injury) ([place])  Estimated body mass index is 27.46 kg/m as calculated from the following:   Height as of this encounter: 5\' 5"  (1.651 m).   Weight as of this encounter: 74.8 kg. Advance diet  Continue wound vac.      Weight-Bearing as tolerated to left leg  Cassell Smiles, PA-C [facility] Orthopaedic Surgery 04/14/2019, 11:56 AM

## 2019-04-14 NOTE — Evaluation (Signed)
Physical Therapy Evaluation Patient Details Name: [name] MRN: [id number] DOB: Jan 07, 1956 Today's Date: 04/14/2019   History of Present Illness  presented to ER secondary to fall at SNF, noted with AMS; admitted for management of AKI and L hip wound dehiscence.  S/P irrigation/debridement (04/14/19), WBAT, posterior THPs (revision 03/16/19).  Of note at least 1-2 previous presentations to ER (since L THR) secondary to AMS, metabolic encephalopathy.  Clinical Impression  Upon evaluation, patient alert and oriented to basic information, but generally confused to details/complex information with very limited STM/recall of new information.  Patient easily agitated and somewhat argumentative throughout session, but redirectable.  L LE grossly 3-/5, limited by pain; ROM limited by THPs (max/dep assist for recall and adherence throughout session).  Currently requiring min assist for bed mobility; min assist for sit/stand, standing balance and bed/chair transfer with loftstrand crutch.  Demonstrates broad BOS with choppy, inconsistent stepping pattern; noted tremors, increased sway in all planes.  Unsafe/unable to attempt without +1 hands-on assist at all times.  Patient refused additional gait distance; will continue to assess/progress as medically appropriate.    Follow Up Recommendations SNF    Equipment Recommendations       Recommendations for Other Services       Precautions / Restrictions Precautions Precautions: Posterior Hip;Fall Restrictions Weight Bearing Restrictions: Yes LLE Weight Bearing: Weight bearing as tolerated Other Position/Activity Restrictions: Hip precautions/WB status taken from chart review from recent admission for L THA revision.      Mobility  Bed Mobility Overal bed mobility: Needs Assistance Bed Mobility: Supine to Sit     Supine to sit: Min assist        Transfers Overall transfer level: Needs assistance Equipment used: Lofstrands Transfers:  Sit to/from Stand Sit to Stand: Min assist            Ambulation/Gait Ambulation/Gait assistance: Herbalist (Feet): 5 Feet Assistive device: Lofstrands       General Gait Details: broad BOS, choppy steps with inconsistent foot placement; poor balance, increased sway, tremors noted  Stairs            Wheelchair Mobility    Modified Rankin (Stroke Patients Only)       Balance Overall balance assessment: Needs assistance Sitting-balance support: No upper extremity supported;Feet supported Sitting balance-Leahy Scale: Fair     Standing balance support: Single extremity supported Standing balance-Leahy Scale: Poor Standing balance comment: increased sway in all planes, min assist at all times for balance correction, safety                             Pertinent Vitals/Pain Pain Assessment: Faces Faces Pain Scale: Hurts little more Pain Location: L hip Pain Descriptors / Indicators: Aching;Sore Pain Intervention(s): Limited activity within patient's tolerance;Monitored during session;Repositioned;Patient requesting pain meds-RN notified;RN gave pain meds during session    [place] expects to be discharged to:: Skilled nursing facility Living Arrangements: Alone Available Help at Discharge: Friend(s);Available PRN/intermittently;Personal care attendant(aide 2-3 hours/day, 7 days/week since THR) Type of Home: Apartment Home Access: Stairs to enter Entrance Stairs-Rails: Right;Left;Can reach both Entrance Stairs-Number of Steps: 3 Home Layout: One level Home Equipment: Bedside commode;Other (comment);Shower seat;Wheelchair - manual Additional Comments: Loftstrand crutches    Prior Function Level of Independence: Needs assistance   Gait / Transfers Assistance Needed: Pt Mod Ind with Amb with loftstrand crutch with 5 falls in the last year mostly secondary to L hip instability,  Ind with bed mobility and transfers  ADL's  / Homemaking Assistance Needed: Pt has a PCA 2.5 hrs/day, 7 days/wk who assists as needed including with housework, bathing, and dressing  Comments: uses loft stand crutch recently, previously indep     Hand Dominance   Dominant Hand: Right    Extremity/Trunk Assessment   Upper Extremity Assessment Upper Extremity Assessment: Overall WFL for tasks assessed LUE Deficits / Details: hx of LUE amputation.    Lower Extremity Assessment Lower Extremity Assessment: Generalized weakness(R LE grossly 4-/5, L LE grossly 3-/5, limited by pain)       Communication   Communication: No difficulties  Cognition Arousal/Alertness: Awake/alert Behavior During Therapy: (argumentative and easily agitated, but redirectable) Overall Cognitive Status: No family/caregiver present to determine baseline cognitive functioning                                 General Comments: Oriented to self, location and general situation; but confused to details of admission, very limited recall of new information.  Generally anxious; intermittently argumentative and anxious.  Redirectable with distractionary conversation.      General Comments      Exercises Other Exercises Other Exercises: L LE supine therex, 1x10, act assist ROM: ankle pumps, quad sets, SAQs, heel slides, hip abduct/adduct. Other Exercises: Educated/reviewed L THPs; patient unable to recall without cuing/assist from therapist.  Constant cuing/assist for adherence with functional activities   Assessment/Plan    PT Assessment Patient needs continued PT services  PT Problem List Decreased strength;Decreased activity tolerance;Decreased balance;Decreased mobility;Decreased knowledge of precautions       PT Treatment Interventions DME instruction;Gait training;Stair training;Functional mobility training;Therapeutic activities;Therapeutic exercise;Balance training;Patient/family education;Cognitive remediation    PT Goals (Current  goals can be found in the Care Plan section)  Acute Rehab PT Goals Patient Stated Goal: to get stronger PT Goal Formulation: With patient Time For Goal Achievement: 04/28/19 Potential to Achieve Goals: Good    Frequency 7X/week   Barriers to discharge        Co-evaluation               AM-PAC PT "6 Clicks" Mobility  Outcome Measure Help needed turning from your back to your side while in a flat bed without using bedrails?: A Little Help needed moving from lying on your back to sitting on the side of a flat bed without using bedrails?: A Little Help needed moving to and from a bed to a chair (including a wheelchair)?: A Little Help needed standing up from a chair using your arms (e.g., wheelchair or bedside chair)?: A Little Help needed to walk in hospital room?: A Little Help needed climbing 3-5 steps with a railing? : A Lot 6 Click Score: 17    End of Session Equipment Utilized During Treatment: Gait belt Activity Tolerance: Patient tolerated treatment well Patient left: in chair;with call bell/phone within reach;with chair alarm set Nurse Communication: Mobility status PT Visit Diagnosis: Unsteadiness on feet (R26.81);History of falling (Z91.81);Other abnormalities of gait and mobility (R26.89);Muscle weakness (generalized) (M62.81);Pain Pain - Right/Left: Left Pain - part of body: Hip    Time: [id number] PT Time Calculation (min) (ACUTE ONLY): 35 min   Charges:     PT Treatments $Gait Training: 8-22 mins $Therapeutic Activity: 8-22 mins        Corian Handley H. Owens Shark, PT, DPT, NCS 04/14/19, 10:32 PM [phone]

## 2019-04-14 NOTE — Anesthesia Postprocedure Evaluation (Signed)
Anesthesia Post Note  Patient: [name]  Procedure(s) Performed: INCISION AND DRAINAGE (Left Hip)  Patient location during evaluation: PACU Anesthesia Type: General Level of consciousness: awake and alert Pain management: pain level controlled Vital Signs Assessment: post-procedure vital signs reviewed and stable Respiratory status: spontaneous breathing, nonlabored ventilation and respiratory function stable Cardiovascular status: blood pressure returned to baseline and stable Postop Assessment: no apparent nausea or vomiting Anesthetic complications: no     Last Vitals:  Vitals:   04/14/19 0431 04/14/19 0515  BP: (!) 112/56   Pulse: 63 64  Resp:    Temp:  (!) 36.3 C  SpO2: 93% 99%    Last Pain:  Vitals:   04/14/19 0521  TempSrc:   PainSc: 0-No pain                 Durenda Hurt

## 2019-04-14 NOTE — Anesthesia Procedure Notes (Signed)
Procedure Name: Intubation Date/Time: 04/14/2019 12:00 AM Performed by: Jonna Clark, CRNA Pre-anesthesia Checklist: Patient identified, Patient being monitored, Timeout performed, Emergency Drugs available and Suction available Patient Re-evaluated:Patient Re-evaluated prior to induction Oxygen Delivery Method: Circle system utilized Preoxygenation: Pre-oxygenation with 100% oxygen Induction Type: IV induction and Rapid sequence Ventilation: Mask ventilation without difficulty Laryngoscope Size: Mac and 3 Grade View: Grade I Tube type: Oral Tube size: 7.0 mm Number of attempts: 1 Airway Equipment and Method: Stylet Placement Confirmation: ETT inserted through vocal cords under direct vision,  positive ETCO2 and breath sounds checked- equal and bilateral Secured at: 21 cm Tube secured with: Tape Dental Injury: Teeth and Oropharynx as per pre-operative assessment

## 2019-04-14 NOTE — Op Note (Signed)
OPERATIVE NOTE  DATE OF SURGERY:  04/14/2019  PATIENT NAME:  [name]   DOB: 31-Aug-1955  MRN: [id number]   PRE-OPERATIVE DIAGNOSIS: Traumatic dehiscence of the left hip incision  POST-OPERATIVE DIAGNOSIS:  Same  PROCEDURE: Irrigation and debridement of the left hip incision with primary closure  SURGEON:  Marciano Sequin., M.D.   ANESTHESIA: general  ESTIMATED BLOOD LOSS: Minimal  FLUIDS REPLACED: 800 mL of crystalloid  DRAINS: 13 cm Prevena wound VAC  INDICATIONS FOR SURGERY: DAIZEE KOVACK is a 63 y.o. year old female who underwent left hip revision arthroplasty approximately 1 month ago.  She had done well perioperatively.  She had recently been admitted to the hospital for altered mental status and encephalopathy.  She had been discharged to a skilled nursing facility.  Earlier in the day she apparently fell and sustained a traumatic dehiscence of the distal portion of the surgical site.  Due to the patient's altered mental status, consent for surgery was obtained by family members.  PROCEDURE IN DETAIL: The patient was brought into the operating room and, after adequate general endotracheal anesthesia was achieved, the patient was positioned on a beanbag and placed in a right lateral decubitus position.  All bony prominences were well-padded.  The patient's left hip and leg were cleaned and prepped with Betadine and draped in usual sterile fashion.  A "timeout" was performed as per usual protocol.  There was dehiscence of the inferior portion of the surgical incision measuring approximately 10 cm.  Residual Vicryl sutures were removed.  The incision was slightly extended inferiorly due to some undermining of the subcutaneous tissue.  The wound was then irrigated with 3000 cc of normal saline with antibiotic solution using pulsatile lavage.  Sharp excisional debridement of some of the subcutaneous tissue was performed using a scalpel.  The fascia was intact without evidence of any  breach.  Good hemostasis was noted.  Subcutaneous tissue was approximated using first #0 Monocryl.  Second layer of closure was performed using interrupted sutures of #2-0 Monocryl.  The skin was closed with skin staples.  A 13 cm Prevena wound VAC was applied and suction engaged.  The patient tolerated the procedure well.  She was transported to the recovery room in stable condition.  Shamanda Len P. Holley Bouche M.D.

## 2019-04-14 NOTE — Transfer of Care (Signed)
Immediate Anesthesia Transfer of Care Note  Patient: [name]  Procedure(s) Performed: INCISION AND DRAINAGE (Left Hip)  Patient Location: PACU  Anesthesia Type:General  Level of Consciousness: drowsy and patient cooperative  Airway & Oxygen Therapy: Patient Spontanous Breathing and Patient connected to face mask oxygen  Post-op Assessment: Report given to RN and Post -op Vital signs reviewed and stable  Post vital signs: Reviewed and stable  Last Vitals:  Vitals Value Taken Time  BP 97/42 04/14/19 0122  Temp 36.2 C 04/14/19 0122  Pulse 58 04/14/19 0123  Resp 13 04/14/19 0123  SpO2 100 % 04/14/19 0123  Vitals shown include unvalidated device data.  Last Pain:  Vitals:   04/13/19 1855  TempSrc: Oral         Complications: No apparent anesthesia complications

## 2019-04-14 NOTE — Plan of Care (Addendum)
The patient answers orientation questions correctly yet is forgetful at times as she forgets who has seen her. The patient was seen by Dr. Estanislado Pandy twice and once by Dr. Marry Guan and could not remember. She was very upset earlier in the day as she could not remember that the providers has seen her and demanded that they see her. The patient requested to be seen by by psych as she indicates she has been going through a lot in the last two weeks. The patient was up in the chair today with PT. She got up out of the chair and the bed alarm came on and when the nurse arrived in the room the patient was attempting to get in the bed. The patient was educated yet becomes frustrated quickly. The patient can get very agitated and irritated quickly.    Problem: Education: Goal: Knowledge of General Education information will improve Description: Including pain rating scale, medication(s)/side effects and non-pharmacologic comfort measures Outcome: Progressing   Problem: Health Behavior/Discharge Planning: Goal: Ability to manage health-related needs will improve Outcome: Progressing   Problem: Clinical Measurements: Goal: Ability to maintain clinical measurements within normal limits will improve Outcome: Progressing Goal: Will remain free from infection Outcome: Progressing Goal: Diagnostic test results will improve Outcome: Progressing Goal: Respiratory complications will improve Outcome: Progressing Goal: Cardiovascular complication will be avoided Outcome: Progressing   Problem: Nutrition: Goal: Adequate nutrition will be maintained Outcome: Progressing   Problem: Coping: Goal: Level of anxiety will decrease Outcome: Progressing   Problem: Elimination: Goal: Will not experience complications related to bowel motility Outcome: Progressing Goal: Will not experience complications related to urinary retention Outcome: Progressing   Problem: Pain Managment: Goal: General experience of comfort  will improve Outcome: Progressing   Problem: Safety: Goal: Ability to remain free from injury will improve Outcome: Progressing   Problem: Skin Integrity: Goal: Risk for impaired skin integrity will decrease Outcome: Progressing

## 2019-04-14 NOTE — Progress Notes (Addendum)
[facility] NAME: Domingue Delorge    MR#:  [id number]  DATE OF BIRTH:  [birth year]  SUBJECTIVE:  CHIEF COMPLAINT:   Chief Complaint  Patient presents with  . Fall  Patient seen today Was irritable this morning She wanted her Neurontin and Effexor to be restarted Pain in the hip well-tolerated Has a wound VAC No fever Feels stressed now a days  REVIEW OF SYSTEMS:    ROS  CONSTITUTIONAL: No documented fever. No fatigue, weakness. No weight gain, no weight loss.  EYES: No blurry or double vision.  ENT: No tinnitus. No postnasal drip. No redness of the oropharynx.  RESPIRATORY: No cough, no wheeze, no hemoptysis. No dyspnea.  CARDIOVASCULAR: No chest pain. No orthopnea. No palpitations. No syncope.  GASTROINTESTINAL: No nausea, no vomiting or diarrhea. No abdominal pain. No melena or hematochezia.  GENITOURINARY: No dysuria or hematuria.  ENDOCRINE: No polyuria or nocturia. No heat or cold intolerance.  HEMATOLOGY: No anemia. No bruising. No bleeding.  INTEGUMENTARY: No rashes. No lesions.  MUSCULOSKELETAL: No arthritis.  Left upper extremity status post amputation Has hip pain NEUROLOGIC: No numbness, tingling, or ataxia. No seizure-type activity.  PSYCHIATRIC: No anxiety. No insomnia. No ADD.   DRUG ALLERGIES:   Allergies  Allergen Reactions  . Benadryl [Diphenhydramine] Hives and Other (See Comments)    Reaction:  Hyperactivity   . Benadryl [Diphenhydramine] Other (See Comments)    My jaw locked and I could not speak, like a stroke.  . Cephalosporins Hives  . Codeine Nausea And Vomiting    VITALS:  Blood pressure (!) 112/56, pulse 64, temperature (!) 97.4 F (36.3 C), temperature source Oral, resp. rate 15, height 5\' 5"  (1.651 m), weight 74.8 kg, SpO2 99 %.  PHYSICAL EXAMINATION:   Physical Exam  GENERAL:  63 y.o.-year-old patient lying in the bed with no acute distress.  EYES: Pupils equal, round, reactive to  light and accommodation. No scleral icterus. Extraocular muscles intact.  HEENT: Head atraumatic, normocephalic. Oropharynx and nasopharynx clear.  NECK:  Supple, no jugular venous distention. No thyroid enlargement, no tenderness.  LUNGS: Normal breath sounds bilaterally, no wheezing, rales, rhonchi. No use of accessory muscles of respiration.  CARDIOVASCULAR: S1, S2 normal. No murmurs, rubs, or gallops.  ABDOMEN: Soft, nontender, nondistended. Bowel sounds present. No organomegaly or mass.  EXTREMITIES: No cyanosis, clubbing or edema b/l.    Left upper extremity amputation Wound VAC noted to the left hip NEUROLOGIC: Cranial nerves II through XII are intact. No focal Motor or sensory deficits b/l.   PSYCHIATRIC: The patient is alert and oriented x 3.  SKIN: wound dehiscence left hip S/p wound vac  LABORATORY PANEL:   CBC Recent Labs  Lab 04/14/19 0857  WBC 5.7  HGB 10.0*  HCT 31.0*  PLT 192   ------------------------------------------------------------------------------------------------------------------ Chemistries  Recent Labs  Lab 04/14/19 0857  NA 142  K 4.6  CL 111  CO2 18*  GLUCOSE 139*  BUN 18  CREATININE 0.88  CALCIUM 8.6*   ------------------------------------------------------------------------------------------------------------------  Cardiac Enzymes No results for input(s): TROPONINI in the last 168 hours. ------------------------------------------------------------------------------------------------------------------  RADIOLOGY:  Ct Head Wo Contrast  Result Date: 04/13/2019 CLINICAL DATA:  Head trauma secondary to a fall today. EXAM: CT HEAD WITHOUT CONTRAST TECHNIQUE: Contiguous axial images were obtained from the base of the skull through the vertex without intravenous contrast. COMPARISON:  CT scan dated 03/31/2019 FINDINGS: Brain: No evidence of acute infarction, hemorrhage, hydrocephalus, extra-axial collection or mass  lesion/mass effect.  Vascular: No hyperdense vessel or unexpected calcification. Skull: Normal. Negative for fracture or focal lesion. Sinuses/Orbits: Normal. Other: None IMPRESSION: Normal exam. Electronically Signed   By: Lorriane Shire M.D.   On: 04/13/2019 20:07   Dg Hip Unilat W Or Wo Pelvis 2-3 Views Left  Result Date: 04/13/2019 CLINICAL DATA:  Fall history of left hip fracture surgery EXAM: DG HIP (WITH OR WITHOUT PELVIS) 2-3V LEFT COMPARISON:  03/16/2019 FINDINGS: Bilateral hip replacements with normal alignment and no fracture. Pubic symphysis and rami are intact. No SI joint widening. Small amount of gas within the subcutaneous soft tissues overlying the left trochanter. IMPRESSION: 1. Bilateral hip replacements without acute osseous abnormality 2. Small amount of soft tissue gas within the lateral soft tissues of left hip. This would be unusual given that surgery was almost a month ago. Correlate for any history of recent drain removal or for soft tissue injury in the region on physical exam. Electronically Signed   By: Donavan Foil M.D.   On: 04/13/2019 20:46     ASSESSMENT AND PLAN:   63 year old female patient with history of Parkinson's disease, arthritis, bipolar disorder, Etoh abuse, neuropathy, bilateral hip replacement currently under hospitalist service  -Acute kidney injury Improving with IV fluids Hold nephrotoxic meds  -Metabolic encephalopathy Improved with IV fluids and supportive care CT head negative for acute abnormality Restart Neurontin at lower dose  -Mechanical fall with wound dehiscence left hip Status post orthopedic evaluation Currently on wound VAC Orthopedic management to continue IV clindamycin antibiotic given prophylactically  -Parkinson's disease Continue Sinemet and amantadine  -Mood disorder Continue trazodone and Cogentin Add xanax for anxiety  Behavioral health consult for anxiety/depression  -UTI On Macrodantin antibiotic   All the records are  reviewed and case discussed with Care Management/Social Worker. Management plans discussed with the patient, family and they are in agreement.  CODE STATUS: Full code  DVT Prophylaxis: SCDs  TOTAL TIME TAKING CARE OF THIS PATIENT: 36 minutes.   POSSIBLE D/C IN 2 to 3 DAYS, DEPENDING ON CLINICAL CONDITION.  Saundra Shelling M.D on 04/14/2019 at 2:09 PM  Between 7am to 6pm - Pager - [phone]  After 6pm go to [email or web address] - password [email or web address] [facility]  Office  [phone]  CC: Primary care physician; Dion Body, MD  Note: This dictation was prepared with Dragon dictation along with smaller phrase technology. Any transcriptional errors that result from this process are unintentional.

## 2019-04-14 NOTE — Anesthesia Post-op Follow-up Note (Signed)
Anesthesia QCDR form completed.        

## 2019-04-15 LAB — CBC
HCT: 28.9 % — ABNORMAL LOW (ref 36.0–46.0)
Hemoglobin: 9.1 g/dL — ABNORMAL LOW (ref 12.0–15.0)
MCH: 30.8 pg (ref 26.0–34.0)
MCHC: 31.5 g/dL (ref 30.0–36.0)
MCV: 98 fL (ref 80.0–100.0)
Platelets: 198 10*3/uL (ref 150–400)
RBC: 2.95 MIL/uL — ABNORMAL LOW (ref 3.87–5.11)
RDW: 14.7 % (ref 11.5–15.5)
WBC: 7.9 10*3/uL (ref 4.0–10.5)
nRBC: 0 % (ref 0.0–0.2)

## 2019-04-15 LAB — BASIC METABOLIC PANEL
Anion gap: 5 (ref 5–15)
BUN: 15 mg/dL (ref 8–23)
CO2: 23 mmol/L (ref 22–32)
Calcium: 8 mg/dL — ABNORMAL LOW (ref 8.9–10.3)
Chloride: 114 mmol/L — ABNORMAL HIGH (ref 98–111)
Creatinine, Ser: 0.83 mg/dL (ref 0.44–1.00)
GFR calc Af Amer: >60 mL/min (ref >60)
GFR calc non Af Amer: >60 mL/min (ref >60)
Glucose, Bld: 97 mg/dL (ref 70–99)
Potassium: 3.6 mmol/L (ref 3.5–5.1)
Sodium: 142 mmol/L (ref 135–145)

## 2019-04-15 NOTE — Progress Notes (Signed)
04/15/2019 3:10 PM  Entered patient's room because she told unit secretary she wanted to set up a password so her daughter could talk to staff about patient condition.  I got her password entered in the EMR  and the patient asked me to "give her daughter an update."  I explained that because I had not been involved in her care up to this point, it would be best for her daughter to speak to her nurse.  Patient became agitated, yelling "Well what hell did you come in here for?!"  Explained that I was trying to help set up the password and that I'd be happy to get the nurse to call her daughter.  Patient again yelled more expletives.  I told her that while I was happy to assist her, I would not be staying in the room if she was going to yell curse words at me.  She continued to do so, and I left the room.  Notified patient's RN Misty that password had been set up and advised her to go into the patient's room and call the patient's daughter to verify password and share info.  Dola Argyle, RN

## 2019-04-15 NOTE — Evaluation (Signed)
Occupational Therapy Evaluation Patient Details Name: [name] MRN: [id number] DOB: December 07, 1955 Today's Date: 04/15/2019    History of Present Illness presented to ER secondary to fall at SNF, noted with AMS; admitted for management of AKI and L hip wound dehiscence.  S/P irrigation/debridement (04/14/19), WBAT, posterior THPs (revision 03/16/19).  Of note at least 1-2 previous presentations to ER (since L THR) secondary to AMS, metabolic encephalopathy.   Clinical Impression   [name] was seen for OT evaluation this date, POD#1 from above surgery. Pt has had multiple admissions in the past month, she presents on this occasion from [facility]. Pt states she does not recall her fall or the circumstances surrounding it, but she does endorse falling multiple times while at STR. Pt noted to be unreliable historian this date, with limited short term memory and ability to follow 1-step commands consistently. Pt also endorses visual hallucinations stating she is "seeing and talking to people who aren't there". Pt requires moderate assist for LB ALD management while in seated position due to pain and limited AROM of L hip. Pt able to recall 2/3 posterior total hip precautions at start of session and unable to verbalize how to implement during ADL and mobility. Pt instructed in posterior total hip precautions and how to implement, self care skills, falls prevention strategies.   Pt was noted to have difficulty following posterior hip precautions and was moderately impulsive with her movements, particularly when discussing the importance of not crossing her legs or turning her left leg inward. Pt stated "Well that's just what I do" and intentionally crossed her legs at the ankles when this therapist encouraged her to refrain from this type of movement. Pt also observed to turn her LLE inward stating she could feel increased pressure at her incision site with this movement. This therapist, once again, reiterated  the importance of adherence to these precautions as her hip heals. Pt declines to allow this therapist to place a pillow between her knees/legs to support adherence to precautions.   Pt continues to demonstrate decreased safety awareness and would benefit from skilled OT services to maximize pt safety and recall of education provided this date. Upon hospital DC recommend pt return to STR to maximize safety and return to original PLOF.      Follow Up Recommendations  SNF    Equipment Recommendations  None recommended by OT(Pt has necessary equipment)    Recommendations for Other Services       Precautions / Restrictions Precautions Precautions: Posterior Hip;Fall Precaution Comments: High fall Restrictions Weight Bearing Restrictions: Yes LLE Weight Bearing: Weight bearing as tolerated Other Position/Activity Restrictions: Hip precautions/WB status taken from chart review from recent admission for L THA revision.      Mobility Bed Mobility Overal bed mobility: Needs Assistance Bed Mobility: Supine to Sit     Supine to sit: Min assist Sit to supine: Modified independent (Device/Increase time)   General bed mobility comments: Cueing for posterior hip precuations when coming to sit at EOB. Pt habitual leg crosser and required regular cueing during seated and semi-supine activities.  Transfers Overall transfer level: Needs assistance Equipment used: Lofstrands Transfers: Sit to/from Stand Sit to Stand: Min guard;Supervision              Balance Overall balance assessment: Needs assistance Sitting-balance support: No upper extremity supported;Feet supported Sitting balance-Leahy Scale: Fair Sitting balance - Comments: Pt steady sitting, reaching within BOS. Demonstrates good static and dynamic sitting balance during use of BSC. Able  to weight shift to complete peri-care independently   Standing balance support: Single extremity supported Standing balance-Leahy Scale:  Fair Standing balance comment: Pt able to ambuate with CGA to SBA for mgt of lines and leads. She has limited balance without use of UE support on loftstrand occasional instability noted with pt able to self correct.                           ADL either performed or assessed with clinical judgement   ADL Overall ADL's : Needs assistance/impaired Eating/Feeding: Set up;Sitting   Grooming: Sitting;Set up;Wash/dry hands;Minimal assistance   Upper Body Bathing: Minimal assistance;Min guard;Sitting   Lower Body Bathing: Set up;Minimal assistance;Moderate assistance;Sitting/lateral leans;Cueing for safety;Adhering to hip precautions   Upper Body Dressing : Set up;Min guard;Sitting   Lower Body Dressing: Minimal assistance;Moderate assistance;Sit to/from stand;Cueing for safety;Adhering to hip precautions   Toilet Transfer: Set up;BSC;Ambulation;Min guard;Supervision/safety Toilet Transfer Details (indicate cue type and reason): Pt ambulates to [facility] over room commode this date using lofstrand crutch and SBA for mgt of wound vac and tele monitor this date. Pt agitated by additional lines and leads. States she believes something may have happened during her surgery that her doctors aren't telling her about. OT provide re-assurance and re-direction t/o assessment. Toileting- Clothing Manipulation and Hygiene: Set up;Sit to/from stand;Adhering to hip precautions;Supervision/safety;Cueing for safety       Functional mobility during ADLs: Min guard(With Loftstrand crutch) General ADL Comments: Pt has limited recall of posterior hip precautions and requires consistent cueing t/o OT session for adherence.     Vision Baseline Vision/History: Wears glasses Wears Glasses: At all times Patient Visual Report: No change from baseline Additional Comments: Pt reports having "hallucinations" and "seeing people in my room that aren't there" denies active hallucinations at time of OT evaluation.      Perception     Praxis      Pertinent Vitals/Pain Pain Assessment: 0-10 Pain Score: 8  Pain Location: L hip Pain Descriptors / Indicators: Aching;Sore;Grimacing Pain Intervention(s): Limited activity within patient's tolerance;Monitored during session;Patient requesting pain meds-RN notified;Repositioned     Hand Dominance Right   Extremity/Trunk Assessment Upper Extremity Assessment Upper Extremity Assessment: Overall WFL for tasks assessed LUE Deficits / Details: hx of LUE amputation.   Lower Extremity Assessment Lower Extremity Assessment: Generalized weakness;LLE deficits/detail;Defer to PT evaluation LLE Deficits / Details: s/p L total hip revision. LLE: Unable to fully assess due to pain LLE Coordination: decreased gross motor;decreased fine motor       Communication Communication Communication: No difficulties   Cognition Arousal/Alertness: Awake/alert Behavior During Therapy: Agitated;Restless(easily re-directed) Overall Cognitive Status: No family/caregiver present to determine baseline cognitive functioning                                 General Comments: Pt generally A&O x3 but confused to details of admission. States she does not recall falling. Very limited recall of new information.  Generally anxious; intermittently argumentative.  Redirectable with distractionary conversation/activity this date.   General Comments  Wound vac in place at start/end of session.    Exercises Other Exercises Other Exercises: Educated/reviewed L THPs; patient unable to recall without cuing/assist from therapist.  Constant cuing/assist for adherence during functional activities. Pt at times aggravated and stating she would not follow precautions and intentionally crossed her legs after prompting. Other Exercises: Pt assisted to room bathroomt to use  BSC. OT provided set-up and SBA for mgt of lines and leads. Pt completes peri-care independently.   Shoulder  Instructions      Home Living Family/patient expects to be discharged to:: Skilled nursing facility Living Arrangements: Alone Available Help at Discharge: Friend(s);Available PRN/intermittently;Personal care attendant(Aid 2-3 hrs per day 7days/week since THR) Type of Home: Apartment Home Access: Stairs to enter Entrance Stairs-Number of Steps: 3 Entrance Stairs-Rails: Right;Left;Can reach both Home Layout: One level     Bathroom Shower/Tub: Teacher, early years/pre: Handicapped height Bathroom Accessibility: Yes   Home Equipment: Bedside commode;Other (comment);Shower seat;Wheelchair - manual   Additional Comments: Loftstrand crutches      Prior Functioning/Environment Level of Independence: Needs assistance  Gait / Transfers Assistance Needed: Pt Mod Ind with Amb with loftstrand crutch with 5 falls in the last year mostly secondary to L hip instability, Ind with bed mobility and transfers ADL's / Homemaking Assistance Needed: Pt has a PCA 2.5 hrs/day, 7 days/wk who assists as needed including with housework, bathing, and dressing   Comments: uses loft stand crutch recently, previously indep        OT Problem List: Decreased strength;Decreased coordination;Pain;Decreased range of motion;Decreased activity tolerance;Decreased safety awareness;Impaired balance (sitting and/or standing);Decreased knowledge of precautions;Impaired UE functional use;Decreased knowledge of use of DME or AE      OT Treatment/Interventions: Self-care/ADL training;Balance training;Therapeutic exercise;Therapeutic activities;Energy conservation;DME and/or AE instruction;Patient/family education    OT Goals(Current goals can be found in the care plan section) Acute Rehab OT Goals Patient Stated Goal: to get stronger OT Goal Formulation: With patient Time For Goal Achievement: 04/29/19 Potential to Achieve Goals: Good  OT Frequency: Min 2X/week   Barriers to D/C: Inaccessible home  environment;Decreased caregiver support          Co-evaluation              AM-PAC OT "6 Clicks" Daily Activity     Outcome Measure Help from another person eating meals?: A Little Help from another person taking care of personal grooming?: A Little Help from another person toileting, which includes using toliet, bedpan, or urinal?: A Little Help from another person bathing (including washing, rinsing, drying)?: A Lot Help from another person to put on and taking off regular upper body clothing?: A Little Help from another person to put on and taking off regular lower body clothing?: A Lot 6 Click Score: 16   End of Session Equipment Utilized During Treatment: Gait belt(loftstrand) Nurse Communication: Mobility status;Other (comment);Patient requests pain meds(Used room commode with BSC over and hat in place)  Activity Tolerance: Patient tolerated treatment well Patient left: with call bell/phone within reach;with SCD's reapplied;in bed;with bed alarm set  OT Visit Diagnosis: Other abnormalities of gait and mobility (R26.89);Repeated falls (R29.6);Pain Pain - Right/Left: Left Pain - part of body: Hip                Time: [id number] OT Time Calculation (min): 24 min Charges:  OT General Charges $OT Visit: 1 Visit OT Evaluation $OT Eval Moderate Complexity: 1 Mod OT Treatments $Self Care/Home Management : 8-22 mins  Shara Blazing, M.S., OTR/L Ascom: [phone] 04/15/19, 10:49 AM

## 2019-04-15 NOTE — Progress Notes (Signed)
PT Cancellation Note  Patient Details Name: KIMMIE FOUNDS MRN: [id number] DOB: 11-08-1955   Cancelled Treatment:    Reason Eval/Treat Not Completed: Patient declined, no reason specified(Patient refused treatment session due to pain, fatigue this AM. Meds received prior to session per chart.  Provided with additional encouragement, but patient slightly agitated with continued attempts, adamantly refusing at this time.  Will re-attempt at later time/date as appropriate and patient agreeable to participation.)  Damiah Mcdonald H. Owens Shark, PT, DPT, NCS 04/15/19, 10:24 AM [phone]

## 2019-04-15 NOTE — Care Management Important Message (Signed)
Important Message  Patient Details  Name: [name] MRN: [id number] Date of Birth: [birth year]   Medicare Important Message Given:  Yes  Initial Medicare IM given by Patient Access Associate on 04/14/2019 at 1:39pm.     Dannette Barbara 04/15/2019, 8:26 AM

## 2019-04-15 NOTE — TOC Initial Note (Signed)
Transition of Care [facility]) - Initial/Assessment Note    Patient Details  Name: [name] MRN: [id number] Date of Birth: 18-Nov-1955  Transition of Care [name]) CM/SW Contact:    Beverly Sessions, RN Phone Number: 04/15/2019, 3:47 PM  Clinical Narrative:                 Patient admitted from [facility] wth AKI.   Patient sustained fall at facility.   PT has assessed and recommended short term rehab.  Patient in agreement.  States "I'll go back to [facility] care If I have to, but I would like to try to go somewhere else"  Existing PASRR FL2 sent for signature Bed search initiated   Expected Discharge Plan: Skilled Nursing Facility Barriers to Discharge: Continued Medical Work up   Patient Goals and CMS Choice Patient states their goals for this hospitalization and ongoing recovery are:: to return to SNF      Expected Discharge Plan and Services Expected Discharge Plan: [place]                                              Prior Living Arrangements/Services       Do you feel safe going back to the place where you live?: Yes      Need for Family Participation in Patient Care: Yes (Comment) Care giver support system in place?: No (comment)   Criminal Activity/Legal Involvement Pertinent to Current Situation/Hospitalization: No - Comment as needed  Activities of Daily Living   ADL Screening (condition at time of admission) Patient's cognitive ability adequate to safely complete daily activities?: No Is the patient deaf or have difficulty hearing?: No Does the patient have difficulty seeing, even when wearing glasses/contacts?: Yes Does the patient have difficulty concentrating, remembering, or making decisions?: Yes Patient able to express need for assistance with ADLs?: Yes  Permission Sought/Granted                  Emotional Assessment Appearance:: Appears stated age Attitude/Demeanor/Rapport: Engaged Affect  (typically observed): Accepting Orientation: : Oriented to Self, Oriented to Place, Oriented to  Time, Oriented to Situation   Psych Involvement: No (comment)  Admission diagnosis:  Hypokalemia [E87.6] Somnolence [R40.0] Wound dehiscence [T81.30XA] Patient Active Problem List   Diagnosis Date Noted  . AKI (acute kidney injury) ([place]) 04/13/2019  . Altered mental status   . ARF (acute renal failure) ([place]) 03/31/2019  . Back pain 03/16/2019  . Constipation 03/16/2019  . Parkinson disease ([place]) 03/16/2019  . Murmur 03/16/2019  . Neck pain 03/16/2019  . 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 Essential hypertension 02/22/2019  . At high risk for falls 01/25/2019  . Drug-induced tremor 01/25/2019  . Essential tremor 01/25/2019  . 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 Hip dislocation, left ([place]) 11/29/2018  . Hip dislocation, left, sequela 08/06/2018  . High anion gap metabolic acidosis 99991111  . History of alcohol abuse 10/09/2016  . Neuropathy of left upper extremity 10/09/2016  . Urinary incontinence, mixed 10/09/2016  . Overdose of antidepressant 07/03/2016  . 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 Alcohol use disorder, severe, dependence ([place]) 01/26/2015  . Tobacco use disorder 01/26/2015  . Nicotine dependence, uncomplicated 123XX123  .  Alcohol withdrawal ([place]) 01/08/2015  . 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 Suicidal ideation 12/19/2014  . Alcohol use 10/30/2014  . Anxiety 10/30/2014  . Depression 10/30/2014  . GAD (generalized anxiety disorder) 12/24/2013  . PTSD (post-traumatic stress disorder) 12/22/2013   PCP:  Dion Body, MD Pharmacy:  No Pharmacies Listed    Social Determinants of Health (SDOH) Interventions    Readmission Risk Interventions Readmission Risk  Prevention Plan 04/15/2019 04/06/2019  Transportation Screening Complete Complete  PCP or Specialist Appt within 5-7 Days - Complete  Home Care Screening - Complete  Medication Review (RN CM) - Referral to Pharmacy  Palliative Care Screening Not Applicable -  Medication Review (RN Care Manager) Complete -  Some recent data might be hidden

## 2019-04-15 NOTE — Progress Notes (Signed)
Patient's daughter called for update. Daughter was very upset that no one could stop at the exact moment to speak with her earlier. Updated daughter with current plan of care. Per PT, recommending SNF for rehab. Daughter also pointed out that she spoke to a "Sherry" yesterday and she was suppose to update the daughter's phone number and have SW call her regarding SNF placement. Advised daughter that I am not aware of this person as there is no note in the chart and the phone number was not updated. The daughter began to curse and was very agitated. I assured the daughter that I would place a consult in for SW to speak with her and also updated her phone number in the chart. Daughter currently resides in [place].   Fuller Mandril, RN

## 2019-04-15 NOTE — Progress Notes (Signed)
Physical Therapy Treatment Patient Details Name: [name] MRN: [id number] DOB: 28-Dec-1955 Today's Date: 04/15/2019    History of Present Illness presented to ER secondary to fall at SNF, noted with AMS; admitted for management of AKI and L hip wound dehiscence.  S/P irrigation/debridement (04/14/19), WBAT, posterior THPs (revision 03/16/19).  Of note at least 1-2 previous presentations to ER (since L THR) secondary to AMS, metabolic encephalopathy.    PT Comments    Progressive increase in gait distance, completing 64' x2 with loftstrand, cga/min assist from therapist.  Demonstrating more consistent step through gait pattern with fair/good loading to L LE; however, stepping pattern and foot clearance deteriorates with turns (choppy, festinating stepping).  Good sequencing of loftstrand, but continued cga/min assist for balance throughout. Generally impulsive with limited insight into/recall of THPs; consistent cuing from therapist for awareness and adherence to throughout session.     Follow Up Recommendations  SNF     Equipment Recommendations       Recommendations for Other Services       Precautions / Restrictions Precautions Precautions: Posterior Hip;Fall Restrictions Weight Bearing Restrictions: Yes LLE Weight Bearing: Weight bearing as tolerated    Mobility  Bed Mobility Overal bed mobility: Needs Assistance Bed Mobility: Supine to Sit     Supine to sit: Supervision     General bed mobility comments: cuing for awareness and adherence to THPs L LE  Transfers Overall transfer level: Needs assistance Equipment used: Lofstrands Transfers: Sit to/from Stand Sit to Stand: Min guard         General transfer comment: slightly impulsive, often attempting to stand prior to cuing from therapist  Ambulation/Gait Ambulation/Gait assistance: Min guard Gait Distance (Feet): (50' x2) Assistive device: Lofstrands       General Gait Details: progressing towards  reciprocal stepping pattern with good stance time/loading L LE; more normalized step height/length/width without buckling or LOB. Min assist for balance/safety with turns, as stepping pattern deteriorates with turns (choppy, festinating)   Stairs             Wheelchair Mobility    Modified Rankin (Stroke Patients Only)       Balance Overall balance assessment: Needs assistance Sitting-balance support: No upper extremity supported;Feet supported Sitting balance-Leahy Scale: Good Sitting balance - Comments: excessive shift to R LE due to pain in L hip with sitting   Standing balance support: Single extremity supported   Standing balance comment: cga/min for standing balance                            Cognition Arousal/Alertness: Awake/alert Behavior During Therapy: Impulsive                                   General Comments: Pt generally A&O x3 but confused to details of admission. States she does not recall falling. Very limited recall of new information.  Generally anxious; intermittently argumentative.  Redirectable with distractionary conversation/activity this date.      Exercises Other Exercises Other Exercises: Seated LE therex, 1x15, act ROM: ankle pumps, LAQs, iso hip adduct, marching.  Good isolated strength and activation; min cuing for awareness/adherence to THPs.    General Comments        Pertinent Vitals/Pain Pain Assessment: Faces Faces Pain Scale: Hurts little more Pain Location: L hip Pain Descriptors / Indicators: Aching;Sore;Grimacing Pain Intervention(s): Limited activity within patient's tolerance;Monitored  during session;Repositioned    Home Living                      Prior Function            PT Goals (current goals can now be found in the care plan section) Acute Rehab PT Goals Patient Stated Goal: to get stronger PT Goal Formulation: With patient Time For Goal Achievement: 04/28/19 Potential to  Achieve Goals: Good Progress towards PT goals: Progressing toward goals    Frequency    7X/week      PT Plan Current plan remains appropriate    Co-evaluation              AM-PAC PT "6 Clicks" Mobility   Outcome Measure  Help needed turning from your back to your side while in a flat bed without using bedrails?: None Help needed moving from lying on your back to sitting on the side of a flat bed without using bedrails?: None Help needed moving to and from a bed to a chair (including a wheelchair)?: A Little Help needed standing up from a chair using your arms (e.g., wheelchair or bedside chair)?: A Little Help needed to walk in hospital room?: A Little Help needed climbing 3-5 steps with a railing? : A Lot 6 Click Score: 19    End of Session Equipment Utilized During Treatment: Gait belt Activity Tolerance: Patient tolerated treatment well Patient left: in chair;with call bell/phone within reach;with chair alarm set Nurse Communication: Mobility status PT Visit Diagnosis: Unsteadiness on feet (R26.81);History of falling (Z91.81);Other abnormalities of gait and mobility (R26.89);Muscle weakness (generalized) (M62.81);Pain Pain - Right/Left: Left Pain - part of body: Hip     Time: 1415-1443 PT Time Calculation (min) (ACUTE ONLY): 28 min  Charges:  $Gait Training: 8-22 mins $Therapeutic Exercise: 8-22 mins                     Hailley Byers H. Owens Shark, PT, DPT, NCS 04/15/19, 2:59 PM [phone]

## 2019-04-15 NOTE — Consult Note (Signed)
ORTHOPAEDICS PROGRESS NOTE  PATIENT NAME: [name] DOB: 11/07/1955  MRN: [id number]  POD # 2: Irrigation and debridement of the left hip wound, status post left hip revision arthroplasty  Subjective: The patient is more alert this morning.  She does not recall the specifics of her fall at the facility.  She did state that she had at least 3 falls while at [facility].  Objective: Vital signs in last 24 hours: Temp:  [98 F (36.7 C)-98.1 F (36.7 C)] 98.1 F (36.7 C) (10/23 0509) Pulse Rate:  [67-76] 76 (10/23 0509) Resp:  [16-19] 18 (10/23 0509) BP: (99-127)/(50-68) 127/60 (10/23 0509) SpO2:  [94 %-96 %] 94 % (10/23 0509) Weight:  [65.9 kg] 65.9 kg (10/23 0649)  Intake/Output from previous day: 10/22 0701 - 10/23 0700 In: 497.5 [I.V.:297.5; IV Piggyback:200] Out: 1200 [Urine:1200]  Recent Labs    04/13/19 2014 04/14/19 0857 04/15/19 0534  WBC 12.2* 5.7 7.9  HGB 11.1* 10.0* 9.1*  HCT 35.0* 31.0* 28.9*  PLT 224 192 198  K 3.0* 4.6 3.6  CL 107 111 114*  CO2 22 18* 23  BUN 26* 18 15  CREATININE 1.29* 0.88 0.83  GLUCOSE 84 139* 97  CALCIUM 9.9 8.6* 8.0*    EXAM General: Well-developed well-nourished female seen in no apparent discomfort. Left lower extremity: Prevena wound VAC is in place to the left hip.  No erythema or significant ecchymosis.  Range of motion of the hip is well-tolerated.  The patient demonstrates good bed mobility. Neurologic: Awake, alert, and oriented.  Sensory and motor function are intact.  Assessment: Status post irrigation debridement of the left hip wound for traumatic dehiscence Status post left hip revision arthroplasty  Plan: Physical therapy was initiated yesterday.  Notes were reviewed.  She will definitely benefit from continued physical therapy. Continue with the Prevena wound VAC. DVT Prophylaxis - Lovenox and TED hose  James P. Holley Bouche M.D.

## 2019-04-15 NOTE — NC FL2 (Signed)
[place] LEVEL OF CARE SCREENING TOOL     IDENTIFICATION  Patient Name: [name] Birthdate: [birth year] Sex: female Admission Date (Current Location): 04/13/2019  [place] and [place] Number:  Engineering geologist and Address:  [facility], [address] [place], [place] [address]      Provider Number: [id number]  Attending Physician Name and Address:  Saundra Shelling, MD  Relative Name and Phone Number:  Romie Jumper   [id number] or [place], [place]   [phone]    Current Level of Care: Hospital Recommended Level of Care: [place] Prior Approval Number:    Date Approved/Denied:   PASRR Number: [id number]  Discharge Plan: SNF    Current Diagnoses: Patient Active Problem List   Diagnosis Date Noted  . AKI (acute kidney injury) ([place]) 04/13/2019  . Altered mental status   . ARF (acute renal failure) ([place]) 03/31/2019  . Back pain 03/16/2019  . Constipation 03/16/2019  . Parkinson disease ([place]) 03/16/2019  . Murmur 03/16/2019  . Neck pain 03/16/2019  . 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 Essential hypertension 02/22/2019  . At high risk for falls 01/25/2019  . Drug-induced tremor 01/25/2019  . Essential tremor 01/25/2019  . 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 Displacement of internal left hip prosthesis ([place]) 11/29/2018  . Hip dislocation, left ([place]) 11/29/2018  . Hip dislocation, left, sequela 08/06/2018  . High anion gap metabolic acidosis 99991111  . History of alcohol abuse 10/09/2016  . Neuropathy of left upper extremity 10/09/2016  . Urinary incontinence, mixed 10/09/2016  . Overdose of antidepressant 07/03/2016  . 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 Alcohol use disorder, severe, dependence ([place]) 01/26/2015  . Tobacco use disorder 01/26/2015  . Nicotine dependence, uncomplicated 123XX123   . Alcohol withdrawal ([place]) 01/08/2015  . 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 Suicidal ideation 12/19/2014  . Alcohol use 10/30/2014  . Anxiety 10/30/2014  . Depression 10/30/2014  . GAD (generalized anxiety disorder) 12/24/2013  . PTSD (post-traumatic stress disorder) 12/22/2013    Orientation RESPIRATION BLADDER Height & Weight     Self, Time, Situation, Place  Normal Continent Weight: 65.9 kg Height:  5\' 5"  (165.1 cm)  BEHAVIORAL SYMPTOMS/MOOD NEUROLOGICAL BOWEL NUTRITION STATUS      Continent Diet(Regular)  AMBULATORY STATUS COMMUNICATION OF NEEDS Skin   Limited Assist Verbally Surgical wounds                       Personal Care Assistance Level of Assistance  Bathing, Feeding, Dressing Bathing Assistance: Limited assistance Feeding assistance: Independent Dressing Assistance: Limited assistance     Functional Limitations Info             SPECIAL CARE FACTORS FREQUENCY  PT (By licensed PT), OT (By licensed OT)                    Contractures Contractures Info: Not present    Additional Factors Info  Code Status Code Status Info: Full Allergies Info: Cephalosporins, codeine, benadryl           Current Medications (04/15/2019):  This is the current hospital active medication list Current Facility-Administered Medications  Medication Dose Route Frequency Provider Last Rate Last Dose  . 0.9 %  sodium chloride infusion   Intravenous Continuous Saundra Shelling, MD 75 mL/hr at 04/15/19 1337    .  acetaminophen (TYLENOL) tablet 650 mg  650 mg Oral Q6H PRN Harrie Foreman, MD       Or  . acetaminophen (TYLENOL) suppository 650 mg  650 mg Rectal Q6H PRN Harrie Foreman, MD      . acetaminophen (TYLENOL) tablet 325-650 mg  325-650 mg Oral Q6H PRN Hooten, Laurice Record, MD      . ALPRAZolam Duanne Moron) tablet 0.5 mg  0.5 mg Oral TID PRN Saundra Shelling, MD   0.5 mg at 04/15/19 1320  . amantadine (SYMMETREL) capsule 200 mg  200 mg  Oral BID Harrie Foreman, MD   200 mg at 04/15/19 0940  . benztropine (COGENTIN) tablet 1 mg  1 mg Oral QHS Harrie Foreman, MD   1 mg at 04/14/19 2139  . bisacodyl (DULCOLAX) suppository 10 mg  10 mg Rectal Daily PRN Hooten, Laurice Record, MD      . carbidopa-levodopa (SINEMET IR) 25-100 MG per tablet immediate release 3 tablet  3 tablet Oral TID Harrie Foreman, MD   3 tablet at 04/15/19 0940  . docusate sodium (COLACE) capsule 100 mg  100 mg Oral BID Harrie Foreman, MD   100 mg at 04/15/19 0940  . enoxaparin (LOVENOX) injection 40 mg  40 mg Subcutaneous Q24H Hooten, Laurice Record, MD   40 mg at 04/15/19 1320  . gabapentin (NEURONTIN) capsule 400 mg  400 mg Oral TID Saundra Shelling, MD   400 mg at 04/15/19 0939  . magnesium hydroxide (MILK OF MAGNESIA) suspension 30 mL  30 mL Oral Daily PRN Hooten, Laurice Record, MD      . menthol-cetylpyridinium (CEPACOL) lozenge 3 mg  1 lozenge Oral PRN Hooten, Laurice Record, MD       Or  . phenol (CHLORASEPTIC) mouth spray 1 spray  1 spray Mouth/Throat PRN Hooten, Laurice Record, MD      . metoCLOPramide (REGLAN) tablet 5-10 mg  5-10 mg Oral Q8H PRN Hooten, Laurice Record, MD       Or  . metoCLOPramide (REGLAN) injection 5-10 mg  5-10 mg Intravenous Q8H PRN Hooten, Laurice Record, MD      . nitrofurantoin (macrocrystal-monohydrate) (MACROBID) capsule 100 mg  100 mg Oral Q12H Harrie Foreman, MD   100 mg at 04/15/19 0941  . ondansetron (ZOFRAN) tablet 4 mg  4 mg Oral Q6H PRN Hooten, Laurice Record, MD       Or  . ondansetron (ZOFRAN) injection 4 mg  4 mg Intravenous Q6H PRN Hooten, Laurice Record, MD      . oxyCODONE (Oxy IR/ROXICODONE) immediate release tablet 10 mg  10 mg Oral Q4H PRN Dereck Leep, MD   10 mg at 04/15/19 0940  . oxyCODONE (Oxy IR/ROXICODONE) immediate release tablet 5 mg  5 mg Oral Q4H PRN Hooten, Laurice Record, MD      . pantoprazole (PROTONIX) EC tablet 40 mg  40 mg Oral BID Dereck Leep, MD   40 mg at 04/15/19 0940  . propranolol (INDERAL) tablet 10 mg  10 mg Oral TID Harrie Foreman, MD   10 mg at 04/15/19 0941  . senna-docusate (Senokot-S) tablet 1 tablet  1 tablet Oral BID Dereck Leep, MD   1 tablet at 04/15/19 [id number]  . sodium phosphate (FLEET) 7-19 GM/118ML enema 1 enema  1 enema Rectal Once PRN Hooten, Laurice Record, MD      . traZODone (DESYREL) tablet 150 mg  150 mg Oral QHS PRN Harrie Foreman, MD      .  venlafaxine XR (EFFEXOR-XR) 24 hr capsule 300 mg  300 mg Oral Q breakfast Saundra Shelling, MD   300 mg at 04/15/19 [id number]     Discharge Medications: Please see discharge summary for a list of discharge medications.  Relevant Imaging Results:  Relevant Lab Results:   Additional Information SSN [id number]  Beverly Sessions, RN

## 2019-04-15 NOTE — Progress Notes (Signed)
[facility] [place] NAME: Windy Cos    MR#:  [id number]  DATE OF BIRTH:  28-Dec-1955  SUBJECTIVE:  CHIEF COMPLAINT:   Chief Complaint  Patient presents with  . Fall  Patient seen today Mood is more pleasant today Decreased pain in the left hip Received physical therapy and Occupational Therapy follow-up Pain in the hip well-tolerated Has a wound VAC No fever Decreased anxiety with anxiolytics  REVIEW OF SYSTEMS:    ROS  CONSTITUTIONAL: No documented fever. No fatigue, weakness. No weight gain, no weight loss.  EYES: No blurry or double vision.  ENT: No tinnitus. No postnasal drip. No redness of the oropharynx.  RESPIRATORY: No cough, no wheeze, no hemoptysis. No dyspnea.  CARDIOVASCULAR: No chest pain. No orthopnea. No palpitations. No syncope.  GASTROINTESTINAL: No nausea, no vomiting or diarrhea. No abdominal pain. No melena or hematochezia.  GENITOURINARY: No dysuria or hematuria.  ENDOCRINE: No polyuria or nocturia. No heat or cold intolerance.  HEMATOLOGY: No anemia. No bruising. No bleeding.  INTEGUMENTARY: No rashes. No lesions.  MUSCULOSKELETAL: No arthritis.  Left upper extremity stump noted Has hip pain NEUROLOGIC: No numbness, tingling, or ataxia. No seizure-type activity.  PSYCHIATRIC: No anxiety. No insomnia. No ADD.   DRUG ALLERGIES:   Allergies  Allergen Reactions  . Benadryl [Diphenhydramine] Hives and Other (See Comments)    Reaction:  Hyperactivity   . Benadryl [Diphenhydramine] Other (See Comments)    My jaw locked and I could not speak, like a stroke.  . Cephalosporins Hives  . Codeine Nausea And Vomiting    VITALS:  Blood pressure 127/60, pulse 76, temperature 98.1 F (36.7 C), temperature source Oral, resp. rate 18, height 5\' 5"  (1.651 m), weight 65.9 kg, SpO2 94 %.  PHYSICAL EXAMINATION:   Physical Exam  GENERAL:  63 y.o.-year-old patient lying in the bed with no acute distress.  EYES:  Pupils equal, round, reactive to light and accommodation. No scleral icterus. Extraocular muscles intact.  HEENT: Head atraumatic, normocephalic. Oropharynx and nasopharynx clear.  NECK:  Supple, no jugular venous distention. No thyroid enlargement, no tenderness.  LUNGS: Normal breath sounds bilaterally, no wheezing, rales, rhonchi. No use of accessory muscles of respiration.  CARDIOVASCULAR: S1, S2 normal. No murmurs, rubs, or gallops.  ABDOMEN: Soft, nontender, nondistended. Bowel sounds present. No organomegaly or mass.  EXTREMITIES: No cyanosis, clubbing or edema b/l.    Left upper extremity amputation Wound VAC noted to the left hip NEUROLOGIC: Cranial nerves II through XII are intact. No focal Motor or sensory deficits b/l.   PSYCHIATRIC: The patient is alert and oriented x 3.  SKIN: wound dehiscence left hip S/p wound vac  LABORATORY PANEL:   CBC Recent Labs  Lab 04/15/19 0534  WBC 7.9  HGB 9.1*  HCT 28.9*  PLT 198   ------------------------------------------------------------------------------------------------------------------ Chemistries  Recent Labs  Lab 04/15/19 0534  NA 142  K 3.6  CL 114*  CO2 23  GLUCOSE 97  BUN 15  CREATININE 0.83  CALCIUM 8.0*   ------------------------------------------------------------------------------------------------------------------  Cardiac Enzymes No results for input(s): TROPONINI in the last 168 hours. ------------------------------------------------------------------------------------------------------------------  RADIOLOGY:  Ct Head Wo Contrast  Result Date: 04/13/2019 CLINICAL DATA:  Head trauma secondary to a fall today. EXAM: CT HEAD WITHOUT CONTRAST TECHNIQUE: Contiguous axial images were obtained from the base of the skull through the vertex without intravenous contrast. COMPARISON:  CT scan dated 03/31/2019 FINDINGS: Brain: No evidence of acute infarction, hemorrhage, hydrocephalus, extra-axial collection or  mass lesion/mass effect. Vascular: No hyperdense vessel or unexpected calcification. Skull: Normal. Negative for fracture or focal lesion. Sinuses/Orbits: Normal. Other: None IMPRESSION: Normal exam. Electronically Signed   By: Lorriane Shire M.D.   On: 04/13/2019 20:07   Dg Hip Unilat W Or Wo Pelvis 2-3 Views Left  Result Date: 04/13/2019 CLINICAL DATA:  Fall history of left hip fracture surgery EXAM: DG HIP (WITH OR WITHOUT PELVIS) 2-3V LEFT COMPARISON:  03/16/2019 FINDINGS: Bilateral hip replacements with normal alignment and no fracture. Pubic symphysis and rami are intact. No SI joint widening. Small amount of gas within the subcutaneous soft tissues overlying the left trochanter. IMPRESSION: 1. Bilateral hip replacements without acute osseous abnormality 2. Small amount of soft tissue gas within the lateral soft tissues of left hip. This would be unusual given that surgery was almost a month ago. Correlate for any history of recent drain removal or for soft tissue injury in the region on physical exam. Electronically Signed   By: Donavan Foil M.D.   On: 04/13/2019 20:46     ASSESSMENT AND PLAN:   63 year old female patient with history of Parkinson's disease, arthritis, bipolar disorder, Etoh abuse, neuropathy, bilateral hip replacement currently under hospitalist service  -Acute kidney injury Improved with IV fluids Hold nephrotoxic meds  -Metabolic encephalopathy Improved with IV fluids and supportive care CT head negative for acute abnormality Restart Neurontin at lower dose  -Mechanical fall with wound dehiscence left hip Status post orthopedic evaluation Status post irrigation debridement of the left hip wound for traumatic dehiscence Status post left hip revision arthroplasty Currently on Prevena wound VAC Orthopedic management to continue IV clindamycin antibiotic given prophylactically  -Parkinson's disease Continue Sinemet and amantadine  -Mood disorder Continue  trazodone and Cogentin Added xanax for anxiety   -UTI On Macrodantin antibiotic  -Anemia Could be hemodilution versus blood loss secondary to traumatic dehiscence of the left hip wound Transfuse PRBC if hemoglobin drops less than 7  -DVT prophylaxis subcu Lovenox daily  -Physical therapy and Occupational Therapy follow-up   All the records are reviewed and case discussed with Care Management/Social Worker. Management plans discussed with the patient, family and they are in agreement.  CODE STATUS: Full code  DVT Prophylaxis: SCDs  TOTAL TIME TAKING CARE OF THIS PATIENT: 37 minutes.   POSSIBLE D/C IN 2 to 3 DAYS, DEPENDING ON CLINICAL CONDITION.  Saundra Shelling M.D on 04/15/2019 at 11:50 AM  Between 7am to 6pm - Pager - [phone]  After 6pm go to [email or web address] - password [email or web address] [facility]  Office  [phone]  CC: Primary care physician; Dion Body, MD  Note: This dictation was prepared with Dragon dictation along with smaller phrase technology. Any transcriptional errors that result from this process are unintentional.

## 2019-04-16 LAB — CBC
HCT: 29.4 % — ABNORMAL LOW (ref 36.0–46.0)
Hemoglobin: 9.2 g/dL — ABNORMAL LOW (ref 12.0–15.0)
MCH: 31.1 pg (ref 26.0–34.0)
MCHC: 31.3 g/dL (ref 30.0–36.0)
MCV: 99.3 fL (ref 80.0–100.0)
Platelets: 214 10*3/uL (ref 150–400)
RBC: 2.96 MIL/uL — ABNORMAL LOW (ref 3.87–5.11)
RDW: 15 % (ref 11.5–15.5)
WBC: 8.1 10*3/uL (ref 4.0–10.5)
nRBC: 0 % (ref 0.0–0.2)

## 2019-04-16 NOTE — Progress Notes (Addendum)
[facility] [place] NAME: [name]    MR#:  [id number]  DATE OF BIRTH:  24-Nov-1955  SUBJECTIVE:  Mood is more pleasant today Decreased pain in the left hip Received physical therapy and Occupational Therapy follow-up Pain in the hip well-tolerated Has a wound VAC No fever Decreased anxiety with anxiolytics  REVIEW OF SYSTEMS:    ROS  CONSTITUTIONAL: No documented fever. No fatigue, weakness. No weight gain, no weight loss.  EYES: No blurry or double vision.  ENT: No tinnitus. No postnasal drip. No redness of the oropharynx.  RESPIRATORY: No cough, no wheeze, no hemoptysis. No dyspnea.  CARDIOVASCULAR: No chest pain. No orthopnea. No palpitations. No syncope.  GASTROINTESTINAL: No nausea, no vomiting or diarrhea. No abdominal pain. No melena or hematochezia.  GENITOURINARY: No dysuria or hematuria.  ENDOCRINE: No polyuria or nocturia. No heat or cold intolerance.  HEMATOLOGY: No anemia. No bruising. No bleeding.  INTEGUMENTARY: No rashes. No lesions.  MUSCULOSKELETAL: No arthritis.  Left upper extremity stump noted Has hip pain NEUROLOGIC: No numbness, tingling, or ataxia. No seizure-type activity.  PSYCHIATRIC: No anxiety. No insomnia. No ADD.   DRUG ALLERGIES:   Allergies  Allergen Reactions  . Benadryl [Diphenhydramine] Hives and Other (See Comments)    Reaction:  Hyperactivity   . Benadryl [Diphenhydramine] Other (See Comments)    My jaw locked and I could not speak, like a stroke.  . Cephalosporins Hives  . Codeine Nausea And Vomiting    VITALS:  Blood pressure 131/69, pulse 67, temperature 97.7 F (36.5 C), temperature source Oral, resp. rate 18, height 5\' 5"  (1.651 m), weight 62.5 kg, SpO2 95 %.  PHYSICAL EXAMINATION:   Physical Exam  GENERAL:  63 y.o.-year-old patient lying in the bed with no acute distress.  EYES: Pupils equal, round, reactive to light and accommodation. No scleral icterus. Extraocular  muscles intact.  HEENT: Head atraumatic, normocephalic. Oropharynx and nasopharynx clear.  NECK:  Supple, no jugular venous distention. No thyroid enlargement, no tenderness.  LUNGS: Normal breath sounds bilaterally, no wheezing, rales, rhonchi. No use of accessory muscles of respiration.  CARDIOVASCULAR: S1, S2 normal. No murmurs, rubs, or gallops.  ABDOMEN: Soft, nontender, nondistended. Bowel sounds present. No organomegaly or mass.  EXTREMITIES: No cyanosis, clubbing or edema b/l.    Left upper extremity amputation Wound VAC noted to the left hip NEUROLOGIC: Cranial nerves II through XII are intact. No focal Motor or sensory deficits b/l.   PSYCHIATRIC: The patient is alert and oriented x 3.  SKIN: wound dehiscence left hip S/p wound vac  LABORATORY PANEL:   CBC Recent Labs  Lab 04/16/19 0530  WBC 8.1  HGB 9.2*  HCT 29.4*  PLT 214   ------------------------------------------------------------------------------------------------------------------ Chemistries  Recent Labs  Lab 04/15/19 0534  NA 142  K 3.6  CL 114*  CO2 23  GLUCOSE 97  BUN 15  CREATININE 0.83  CALCIUM 8.0*   ------------------------------------------------------------------------------------------------------------------  Cardiac Enzymes No results for input(s): TROPONINI in the last 168 hours. ------------------------------------------------------------------------------------------------------------------  RADIOLOGY:  No results found.   ASSESSMENT AND PLAN:   63 year old female patient with history of Parkinson's disease, arthritis, bipolar disorder, Etoh abuse, neuropathy, bilateral hip replacement currently under hospitalist service  -Acute kidney injury Improved with IV fluids Hold nephrotoxic meds  -Metabolic encephalopathy Improved with IV fluids and supportive care CT head negative for acute abnormality Restart Neurontin at lower dose  -Mechanical fall with wound dehiscence left  hip Status post orthopedic evaluation  with Dr Marry Guan Status post irrigation debridement of the left hip wound for traumatic dehiscence Status post left hip revision arthroplasty Currently on Prevena wound VAC IV clindamycin antibiotic given prophylactically Pt tolerating PT well  -Parkinson's disease Continue Sinemet and amantadine  -Mood disorder Continue trazodone and Cogentin Added xanax for anxiety   -UTI--Recent ESBL On Macrodantin antibiotic completed >10 days of abxs  -Anemia Could be hemodilution versus blood loss secondary to traumatic dehiscence of the left hip wound Transfuse PRBC if hemoglobin drops less than 7 hgb stable at 9.2 -DVT prophylaxis subcu Lovenox daily  -Physical therapy and Occupational Therapy follow-up  Pt and dter (emily who lives on [address]) want new rehab place.Left VM for dter [name] today. SW placed. Pt medically stable for discharge  CODE STATUS: Full code  DVT Prophylaxis:lovenox   TOTAL TIME TAKING CARE OF THIS PATIENT: 30 minutes.   D/c once rehab bed found.  Fritzi Mandes M.D on 04/16/2019 at 9:40 AM  Between 7am to 6pm - Pager - [phone]  After 6pm go to [email or web address] - password [email or web address] [facility]  Office  [phone]  CC: Primary care physician; Dion Body, MD  Note: This dictation was prepared with Dragon dictation along with smaller phrase technology. Any transcriptional errors that result from this process are unintentional.

## 2019-04-16 NOTE — Progress Notes (Signed)
Patient refused low bed.   Fuller Mandril, RN

## 2019-04-16 NOTE — Progress Notes (Signed)
Physical Therapy Treatment Patient Details Name: [name] MRN: [id number] DOB: 1955-08-24 Today's Date: 04/16/2019    History of Present Illness presented to ER secondary to fall at SNF, noted with AMS; admitted for management of AKI and L hip wound dehiscence.  S/P irrigation/debridement (04/14/19), WBAT, posterior THPs (revision 03/16/19).  Of note at least 1-2 previous presentations to ER (since L THR) secondary to AMS, metabolic encephalopathy.    PT Comments    Agrees to session but stated she was getting ready to take a nap.  Out of bed with supervision.  Stood and was able to walk 2 laps in room about 50' before fatigue.  Returned to supine and readied herself for a nap.     Follow Up Recommendations  SNF     Equipment Recommendations       Recommendations for Other Services       Precautions / Restrictions Precautions Precautions: Posterior Hip;Fall Restrictions Weight Bearing Restrictions: Yes LLE Weight Bearing: Weight bearing as tolerated    Mobility  Bed Mobility Overal bed mobility: Needs Assistance Bed Mobility: Supine to Sit     Supine to sit: Supervision Sit to supine: Supervision   General bed mobility comments: cuing for awareness and adherence to THPs L LE  Transfers Overall transfer level: Needs assistance Equipment used: Lofstrands Transfers: Sit to/from Stand Sit to Stand: Min guard            Ambulation/Gait Ambulation/Gait assistance: Min guard;Min Web designer (Feet): 50 Feet Assistive device: Lofstrands   Gait velocity: Decreased   General Gait Details: progressing towards reciprocal stepping pattern with good stance time/loading L LE; more normalized step height/length/width without buckling or LOB. Min assist for balance/safety with turns, as stepping pattern deteriorates with turns (choppy, festinating)   Stairs             Wheelchair Mobility    Modified Rankin (Stroke Patients Only)       Balance  Overall balance assessment: Needs assistance Sitting-balance support: No upper extremity supported;Feet supported Sitting balance-Leahy Scale: Good     Standing balance support: Single extremity supported Standing balance-Leahy Scale: Fair Standing balance comment: cga/min for standing balance                            Cognition Arousal/Alertness: Awake/alert Behavior During Therapy: WFL for tasks assessed/performed Overall Cognitive Status: Within Functional Limits for tasks assessed                                        Exercises      General Comments        Pertinent Vitals/Pain Pain Assessment: Faces Faces Pain Scale: Hurts a little bit Pain Location: L hip Pain Descriptors / Indicators: Aching;Sore;Grimacing Pain Intervention(s): Limited activity within patient's tolerance;Monitored during session    Home Living                      Prior Function            PT Goals (current goals can now be found in the care plan section) Progress towards PT goals: Progressing toward goals    Frequency    7X/week      PT Plan Current plan remains appropriate    Co-evaluation              AM-PAC  PT "6 Clicks" Mobility   Outcome Measure  Help needed turning from your back to your side while in a flat bed without using bedrails?: None Help needed moving from lying on your back to sitting on the side of a flat bed without using bedrails?: None Help needed moving to and from a bed to a chair (including a wheelchair)?: A Little Help needed standing up from a chair using your arms (e.g., wheelchair or bedside chair)?: A Little Help needed to walk in hospital room?: A Little Help needed climbing 3-5 steps with a railing? : A Lot 6 Click Score: 19    End of Session Equipment Utilized During Treatment: Gait belt Activity Tolerance: Patient tolerated treatment well Patient left: in bed;with call bell/phone within reach;with bed  alarm set   Pain - Right/Left: Left Pain - part of body: Hip     Time: [id number] PT Time Calculation (min) (ACUTE ONLY): 10 min  Charges:  $Gait Training: 8-22 mins                     Chesley Noon, PTA 04/16/19, 12:24 PM

## 2019-04-16 NOTE — Progress Notes (Signed)
Patient's son [name] called for update. Update given. Per notes patient is medically stable to discharge one SNF placement has been confirmed. [name] will call back Monday to follow up with day shift nurse on final placement plan.   Fuller Mandril, RN

## 2019-04-16 NOTE — Progress Notes (Signed)
Subjective: 3 Days Post-Op Procedure(s) (LRB): INCISION AND DRAINAGE (Left) Patient reports pain as mild.   Patient is well, and has had no acute complaints or problems Plan is to go Skilled nursing facility after hospital stay. Negative for chest pain and shortness of breath Fever: no Gastrointestinal:Negative for nausea and vomiting  Objective: Vital signs in last 24 hours: Temp:  [97.7 F (36.5 C)-97.8 F (36.6 C)] 97.7 F (36.5 C) (10/24 0436) Pulse Rate:  [67-71] 67 (10/24 0436) Resp:  [16-18] 18 (10/24 0436) BP: (118-131)/(58-69) 131/69 (10/24 0436) SpO2:  [90 %-95 %] 95 % (10/24 0436) Weight:  [62.5 kg] 62.5 kg (10/24 0441)  Intake/Output from previous day:  Intake/Output Summary (Last 24 hours) at 04/16/2019 0747 Last data filed at 04/16/2019 0412 Gross per 24 hour  Intake 1599.76 ml  Output 500 ml  Net 1099.76 ml    Intake/Output this shift: No intake/output data recorded.  Labs: Recent Labs    04/13/19 2014 04/14/19 0857 04/15/19 0534 04/16/19 0530  HGB 11.1* 10.0* 9.1* 9.2*   Recent Labs    04/15/19 0534 04/16/19 0530  WBC 7.9 8.1  RBC 2.95* 2.96*  HCT 28.9* 29.4*  PLT 198 214   Recent Labs    04/14/19 0857 04/15/19 0534  NA 142 142  K 4.6 3.6  CL 111 114*  CO2 18* 23  BUN 18 15  CREATININE 0.88 0.83  GLUCOSE 139* 97  CALCIUM 8.6* 8.0*   No results for input(s): LABPT, INR in the last 72 hours.   EXAM General - Patient is Alert, Appropriate and Oriented Extremity - ABD soft Sensation intact distally Dorsiflexion/Plantar flexion intact No cellulitis present  Prevena woundvac intact to the left hip this morning without drainage. Dressing/Incision - clean, dry, no drainage to the woundvac. Motor Function - intact, moving foot and toes well on exam.   Past Medical History:  Diagnosis Date  . Anxiety   . Anxiety    panic anxiety disorder  . Arthritis   . Asthma    chronic asthmatic bronchitis  . Bipolar disorder ([place])   .  Cancer [facility])    Desmoid tumor left forearm  . Depression   . ETOH abuse   . Heart murmur   . Insomnia   . Neuropathy    Assessment/Plan: 3 Days Post-Op Procedure(s) (LRB): INCISION AND DRAINAGE (Left) Active Problems:   AKI (acute kidney injury) ([place])  Estimated body mass index is 22.93 kg/m as calculated from the following:   Height as of this encounter: 5\' 5"  (1.651 m).   Weight as of this encounter: 62.5 kg.   Continue with PT.  Prevena without any drainage this AM. Martin Majestic will remain intact for 7-10 days.  When beginning to lose suction can switch to dry dressing.  DVT Prophylaxis - Lovenox and TED hose Weight-Bearing as tolerated to left leg  J. Cameron Proud, PA-C [name] Orthopaedic Surgery 04/16/2019, 7:47 AM

## 2019-04-17 ENCOUNTER — Inpatient Hospital Stay: Payer: Medicare Other

## 2019-04-17 LAB — SARS CORONAVIRUS 2 (TAT 6-24 HRS): SARS Coronavirus 2: POSITIVE — AB

## 2019-04-17 MED ORDER — GUAIFENESIN-DM 100-10 MG/5ML PO SYRP
5.0000 mL | ORAL_SOLUTION | ORAL | Status: DC | PRN
  Administered 2019-04-17: 5 mL via ORAL
  Filled 2019-04-17: qty 5

## 2019-04-17 NOTE — Progress Notes (Signed)
[name] NAME: [name]    MR#:  [id number]  DATE OF BIRTH:  [birth year]  SUBJECTIVE:  Mood is more pleasant today Decreased pain in the left hip Pain in the hip well-tolerated Has a wound VAC Decreased anxiety with anxiolytics all doing well and wondering when she can return back to  healthcare  REVIEW OF SYSTEMS:    Review of Systems  Constitutional: Negative for chills, fever and weight loss.  HENT: Negative for ear discharge, ear pain and nosebleeds.   Eyes: Negative for blurred vision, pain and discharge.  Respiratory: Negative for sputum production, shortness of breath, wheezing and stridor.   Cardiovascular: Negative for chest pain, palpitations, orthopnea and PND.  Gastrointestinal: Negative for abdominal pain, diarrhea, nausea and vomiting.  Genitourinary: Negative for frequency and urgency.  Musculoskeletal: Negative for back pain and joint pain.  Neurological: Positive for weakness. Negative for sensory change, speech change and focal weakness.  Psychiatric/Behavioral: Negative for depression and hallucinations. The patient is not nervous/anxious.       DRUG ALLERGIES:   Allergies  Allergen Reactions  . Benadryl [Diphenhydramine] Hives and Other (See Comments)    Reaction:  Hyperactivity   . Benadryl [Diphenhydramine] Other (See Comments)    My jaw locked and I could not speak, like a stroke.  . Cephalosporins Hives  . Codeine Nausea And Vomiting    VITALS:  Blood pressure 133/62, pulse 75, temperature 99.9 F (37.7 C), temperature source Oral, resp. rate 18, height 5\' 5"  (1.651 m), weight 63.1 kg, SpO2 91 %.  PHYSICAL EXAMINATION:   Physical Exam  GENERAL:  63 y.o.-year-old patient lying in the bed with no acute distress.  EYES: Pupils equal, round, reactive to light and accommodation. No scleral icterus. Extraocular muscles intact.  HEENT: Head atraumatic, normocephalic. Oropharynx and  nasopharynx clear.  NECK:  Supple, no jugular venous distention. No thyroid enlargement, no tenderness.  LUNGS: Normal breath sounds bilaterally, no wheezing, rales, rhonchi. No use of accessory muscles of respiration.  CARDIOVASCULAR: S1, S2 normal. No murmurs, rubs, or gallops.  ABDOMEN: Soft, nontender, nondistended. Bowel sounds present. No organomegaly or mass.  EXTREMITIES: No cyanosis, clubbing or edema b/l.    Left upper extremity amputation Wound VAC noted to the left hip NEUROLOGIC: Cranial nerves II through XII are intact. No focal Motor or sensory deficits b/l.   PSYCHIATRIC: The patient is alert and oriented x 3.  SKIN: wound dehiscence left hip S/p wound vac  LABORATORY PANEL:   CBC Recent Labs  Lab 04/16/19 0530  WBC 8.1  HGB 9.2*  HCT 29.4*  PLT 214   ------------------------------------------------------------------------------------------------------------------ Chemistries  Recent Labs  Lab 04/15/19 0534  NA 142  K 3.6  CL 114*  CO2 23  GLUCOSE 97  BUN 15  CREATININE 0.83  CALCIUM 8.0*   ------------------------------------------------------------------------------------------------------------------  Cardiac Enzymes No results for input(s): TROPONINI in the last 168 hours. ------------------------------------------------------------------------------------------------------------------  RADIOLOGY:  No results found.   ASSESSMENT AND PLAN:   63 year old female patient with history of Parkinson's disease, arthritis, bipolar disorder, Etoh abuse, neuropathy, bilateral hip replacement currently under hospitalist service  -Mechanical fall with wound dehiscence left hip Status post orthopedic evaluation with Dr Marry Guan Status post irrigation debridement of the left hip wound for traumatic dehiscence Status post left hip revision arthroplasty Currently on Prevena wound VAC Pt tolerating PT well  -Acute kidney injury Improved with IV fluids Hold  nephrotoxic meds  -Metabolic encephalopathy Improved with IV fluids  and supportive care CT head negative for acute abnormality Restart Neurontin at lower dose  -Parkinson's disease Continue Sinemet and amantadine  -Mood disorder Continue trazodone and Cogentin Added xanax for anxiety   -UTI--Recent ESBL On Macrodantin antibiotic completed >10 days of abxs  -Anemia Could be hemodilution versus blood loss secondary to traumatic dehiscence of the left hip wound Transfuse PRBC if hemoglobin drops less than 7 hgb stable at 9.2 -DVT prophylaxis subcu Lovenox daily  Repeat covid 10/25 pending  Patient is medically stable for discharge. Currently she has offer only to return back to [facility]. Patient and her son [name] are okay with return to [facility]. Waiting for daughter [name] who lives in [place] to return our phone call back regarding the plan.  CODE STATUS: Full code  DVT Prophylaxis:lovenox   TOTAL TIME TAKING CARE OF THIS PATIENT: 30 minutes.   D/c once rehab bed found.  Fritzi Mandes M.D on 04/17/2019 at 12:11 PM  Between 7am to 6pm - Pager - [phone]  After 6pm go to [email or web address] - password [email or web address] [facility]  Office  [phone]  CC: Primary care physician; Dion Body, MD  Note: This dictation was prepared with Dragon dictation along with smaller phrase technology. Any transcriptional errors that result from this process are unintentional.

## 2019-04-17 NOTE — TOC Progression Note (Addendum)
Transition of Care [facility]) - Progression Note    Patient Details  Name: [name] MRN: [id number] Date of Birth: 1955-10-19  Transition of Care [facility]) CM/SW Contact  Latanya Maudlin, RN Phone Number: 04/17/2019, 11:39 AM  Clinical Narrative:  Patient continues to only have bed offer to return to [facility] care. RNCM called daughter [name] who is in [place] so there has been difficulty making phone contact due to time differences. VM left both Saturday and Sunday. Also left voicemail for the patients son.   Update- Patients son returned my call and is agreeable to return to [facility] since that is our only option. He agrees there is not a safe disposition home. He does tell me that his sister [name] was the one who voiced concerns with returning to [name] and that she is the primary decision maker. Per MD we will order COVID test today and return to [facility] tomorrow. Notified Kelly at [facility].      Expected Discharge Plan: Skilled Nursing Facility Barriers to Discharge: Continued Medical Work up  Expected Discharge Plan and Services Expected Discharge Plan: [address]                                               Social Determinants of Health (SDOH) Interventions    Readmission Risk Interventions Readmission Risk Prevention Plan 04/15/2019 04/06/2019  Transportation Screening Complete Complete  PCP or Specialist Appt within 5-7 Days - Complete  Home Care Screening - Complete  Medication Review (RN CM) - Referral to Pharmacy  Palliative Care Screening Not Applicable -  Medication Review (RN Care Manager) Complete -  Some recent data might be hidden

## 2019-04-17 NOTE — Progress Notes (Signed)
Patient ID: [name], female   DOB: 1955-07-24, 62 y.o.   MRN: [id number]  Repeat COVID positive Get cXR and CRP sats 96% on RA. No respiratory distress reported by pt

## 2019-04-17 NOTE — Progress Notes (Signed)
PT Cancellation Note  Patient Details Name: [name] MRN: [id number] DOB: 11/14/1955   Cancelled Treatment:    Reason Eval/Treat Not Completed: Other (comment)   Went to see pt.  New cough and 99.9 temp.  Discussed with RN, Charge nurse and MD.  Will hold session for today while addressing new symptoms out of precaution.    Chesley Noon 04/17/2019, 9:52 AM

## 2019-04-18 LAB — C-REACTIVE PROTEIN: CRP: 3.5 mg/dL — ABNORMAL HIGH (ref <1.0)

## 2019-04-18 MED ORDER — OXYCODONE HCL 5 MG PO TABS: 5.0000 mg | ORAL_TABLET | ORAL | 0 refills | Status: DC | PRN

## 2019-04-18 MED ORDER — TRAMADOL HCL 50 MG PO TABS: 50.0000 mg | ORAL_TABLET | ORAL | 0 refills | Status: DC | PRN

## 2019-04-18 MED ORDER — ENOXAPARIN SODIUM 40 MG/0.4ML ~~LOC~~ SOLN: 40.0000 mg | SUBCUTANEOUS | 0 refills | Status: DC

## 2019-04-18 MED ORDER — DOCUSATE SODIUM 100 MG PO CAPS: 100.0000 mg | ORAL_CAPSULE | Freq: Two times a day (BID) | ORAL | 0 refills | Status: DC

## 2019-04-18 NOTE — Care Management Important Message (Signed)
Important Message  Patient Details  Name: NEENAH MARCHAND MRN: [id number] Date of Birth: [birth year]   Medicare Important Message Given:  Yes     Beverly Sessions, RN 04/18/2019, 4:38 PM

## 2019-04-18 NOTE — TOC Transition Note (Signed)
Transition of Care [facility]) - CM/SW Discharge Note   Patient Details  Name: [name] MRN: [id number] Date of Birth: September 28, 1955  Transition of Care [facility]) CM/SW Contact:  Beverly Sessions, RN Phone Number: 04/18/2019, 4:23 PM   Clinical Narrative:    Patient to return to [facility] today.  Discharge disposition discussed with patient and son.   Claiborne Billings at [facility] confirms that patient can return, and is aware that patient is covid positive.   Patient to transport by EMS  EMS packet on chart. Bedside RN notified    Final next level of care: Skilled Nursing Facility Barriers to Discharge: Barriers Resolved   Patient Goals and CMS Choice Patient states their goals for this hospitalization and ongoing recovery are:: to return to SNF      Discharge Placement              Patient chooses bed at: [facility]   Name of family member notified: Son preston Patient and family notified of of transfer: 04/18/19  Discharge Plan and Services                                     Social Determinants of Health (SDOH) Interventions     Readmission Risk Interventions Readmission Risk Prevention Plan 04/15/2019 04/06/2019  Transportation Screening Complete Complete  PCP or Specialist Appt within 5-7 Days - Complete  Home Care Screening - Complete  Medication Review (RN CM) - Referral to Pharmacy  Palliative Care Screening Not Applicable -  Medication Review (RN Care Manager) Complete -  Some recent data might be hidden

## 2019-04-18 NOTE — Progress Notes (Signed)
Report called to [facility] and EMS called for transport.  

## 2019-04-18 NOTE — Discharge Summary (Signed)
[name] at [place] NAME: [name]    MR#:  [id number]  DATE OF BIRTH:  August 30, 1955  DATE OF ADMISSION:  04/13/2019   ADMITTING PHYSICIAN: Harrie Foreman, MD  DATE OF DISCHARGE: 04/18/19  PRIMARY CARE PHYSICIAN: Dion Body, MD   ADMISSION DIAGNOSIS:  Hypokalemia [E87.6] Somnolence [R40.0] Wound dehiscence [T81.30XA] DISCHARGE DIAGNOSIS:  Active Problems:   AKI (acute kidney injury) ([name])  SECONDARY DIAGNOSIS:   Past Medical History:  Diagnosis Date  . Anxiety   . Anxiety    panic anxiety disorder  . Arthritis   . Asthma    chronic asthmatic bronchitis  . Bipolar disorder ([place])   . Cancer [facility])    Desmoid tumor left forearm  . Depression   . ETOH abuse   . Heart murmur   . Insomnia   . Neuropathy    HOSPITAL COURSE:   [name] is a 63 year old female who presented to the ED after a fall at her nursing facility.  The fall resulted in wound dehiscence from her previous hip replacement surgery.  In the ED, she was found to have an AKI.  She was admitted for further management.  Traumatic wound dehiscence left hip s/p mechanical fall -Underwent total hip revision arthroplasty on 03/16/2019 -S/p I&D of the left hip incision with primary closure and wound VAC placement 10/22 -Ortho recommended continuing Lovenox -Patient will continue wound VAC to left hip for 7-10 days -Follow-up with orthopedic surgery in 1 week -Tylenol for mild pain, tramadol for moderate pain, oxycodone for severe pain -Evaluated by PT, who recommended SNF  Asymptomatic COVID-19 infection -Tested negative on 10/21, then subsequently tested positive on 10/25 -Chest x-ray with chronic bronchitic changes without acute abnormality -Patient should quarantine for 14 days upon discharge from the hospital  Metabolic encephalopathy-resolved -CT head negative  Parkinson's disease- stable -Continued Sinemet and amantadine  Mood disorder-  stable -Continued trazodone and Cogentin  Recent ESBL E. coli UTI -Noted 03/31/2019 -Patient will complete antibiotic course on 10/27  Acute blood loss anemia -Hemoglobin low but stable -Recheck CBC as an outpatient  DISCHARGE CONDITIONS:  Traumatic wound dehiscence of left hip s/p mechanical fall Asymptomatic COVID-19 infection Parkinson's disease Mood disorder UTI Acute blood loss anemia CONSULTS OBTAINED:  Treatment Team:  Dereck Leep, MD DRUG ALLERGIES:   Allergies  Allergen Reactions  . Benadryl [Diphenhydramine] Hives and Other (See Comments)    Reaction:  Hyperactivity   . Benadryl [Diphenhydramine] Other (See Comments)    My jaw locked and I could not speak, like a stroke.  . Cephalosporins Hives  . Codeine Nausea And Vomiting   DISCHARGE MEDICATIONS:   Allergies as of 04/18/2019      Reactions   Benadryl [diphenhydramine] Hives, Other (See Comments)   Reaction:  Hyperactivity    Benadryl [diphenhydramine] Other (See Comments)   My jaw locked and I could not speak, like a stroke.   Cephalosporins Hives   Codeine Nausea And Vomiting      Medication List    TAKE these medications   acetaminophen 500 MG tablet Commonly known as: TYLENOL Take 1,000 mg by mouth every 6 (six) hours as needed for mild pain or fever.   Amantadine HCl 100 MG tablet Take 200 mg by mouth 2 (two) times daily.   benztropine 1 MG tablet Commonly known as: COGENTIN Take 1 tablet (1 mg total) by mouth at bedtime for 7 days.   calcium carbonate 600 MG Tabs tablet  Commonly known as: OS-CAL Take 600 mg by mouth daily with breakfast.   carbidopa-levodopa 25-100 MG tablet Commonly known as: SINEMET IR Take 3 tablets by mouth 3 (three) times daily.   celecoxib 200 MG capsule Commonly known as: CELEBREX Take 1 capsule (200 mg total) by mouth 2 (two) times daily.   chlordiazePOXIDE 25 MG capsule Commonly known as: LIBRIUM Take 25 mg by mouth 3 (three) times daily.    cyclobenzaprine 5 MG tablet Commonly known as: FLEXERIL Take 5 mg by mouth 3 (three) times daily as needed for muscle spasms.   docusate sodium 100 MG capsule Commonly known as: COLACE Take 1 capsule (100 mg total) by mouth 2 (two) times daily.   enoxaparin 40 MG/0.4ML injection Commonly known as: LOVENOX Inject 0.4 mLs (40 mg total) into the skin daily. For 14 days.   gabapentin 300 MG capsule Commonly known as: NEURONTIN Take 3 capsules (900 mg total) by mouth 3 (three) times daily.   nitrofurantoin (macrocrystal-monohydrate) 100 MG capsule Commonly known as: MACROBID Take 1 capsule (100 mg total) by mouth every 12 (twelve) hours for 14 days.   oxyCODONE 5 MG immediate release tablet Commonly known as: Oxy IR/ROXICODONE Take 1 tablet (5 mg total) by mouth every 4 (four) hours as needed for moderate pain (pain score 4-6).   propranolol 10 MG tablet Commonly known as: INDERAL Take 1 tablet (10 mg total) by mouth 3 (three) times daily.   traMADol 50 MG tablet Commonly known as: ULTRAM Take 1-2 tablets (50-100 mg total) by mouth every 4 (four) hours as needed for moderate pain.   traZODone 150 MG tablet Commonly known as: DESYREL Take 150 mg by mouth at bedtime as needed for sleep.   venlafaxine XR 150 MG 24 hr capsule Commonly known as: EFFEXOR-XR Take 2 capsules (300 mg total) by mouth daily with breakfast.        DISCHARGE INSTRUCTIONS:  1.  Follow-up with PCP in 5 days 2.  Follow-up with orthopedic surgery in 1 week DIET:  Cardiac diet DISCHARGE CONDITION:  Stable ACTIVITY:  Activity as tolerated OXYGEN:  Home Oxygen: No.  Oxygen Delivery: room air DISCHARGE LOCATION:  nursing home   If you experience worsening of your admission symptoms, develop shortness of breath, life threatening emergency, suicidal or homicidal thoughts you must seek medical attention immediately by calling 911 or calling your MD immediately  if symptoms less severe.  You Must read  complete instructions/literature along with all the possible adverse reactions/side effects for all the Medicines you take and that have been prescribed to you. Take any new Medicines after you have completely understood and accpet all the possible adverse reactions/side effects.   Please note  You were cared for by a hospitalist during your hospital stay. If you have any questions about your discharge medications or the care you received while you were in the hospital after you are discharged, you can call the unit and asked to speak with the hospitalist on call if the hospitalist that took care of you is not available. Once you are discharged, your primary care physician will handle any further medical issues. Please note that NO REFILLS for any discharge medications will be authorized once you are discharged, as it is imperative that you return to your primary care physician (or establish a relationship with a primary care physician if you do not have one) for your aftercare needs so that they can reassess your need for medications and monitor your lab values.  On the day of Discharge:  VITAL SIGNS:  Blood pressure 121/60, pulse 71, temperature 98.8 F (37.1 C), temperature source Oral, resp. rate 20, height 5\' 5"  (1.651 m), weight 61 kg, SpO2 95 %. PHYSICAL EXAMINATION:  GENERAL:  63 y.o.-year-old patient lying in the bed with no acute distress.  EYES: Pupils equal, round, reactive to light and accommodation. No scleral icterus. Extraocular muscles intact.  HEENT: Head atraumatic, normocephalic. Oropharynx and nasopharynx clear.  NECK:  Supple, no jugular venous distention. No thyroid enlargement, no tenderness.  LUNGS: Normal breath sounds bilaterally, no wheezing, rales,rhonchi or crepitation. No use of accessory muscles of respiration.  CARDIOVASCULAR: RRR, S1, S2 normal. No murmurs, rubs, or gallops.  ABDOMEN: Soft, non-tender, non-distended. Bowel sounds present. No organomegaly or  mass.  EXTREMITIES: No pedal edema, cyanosis, or clubbing. + Wound VAC in place over left lateral hip NEUROLOGIC: Cranial nerves II through XII are intact. Muscle strength 5/5 in all extremities. Sensation intact. Gait not checked.  PSYCHIATRIC: The patient is alert and oriented x 3.  SKIN: No obvious rash, lesion, or ulcer.  DATA REVIEW:   CBC Recent Labs  Lab 04/16/19 0530  WBC 8.1  HGB 9.2*  HCT 29.4*  PLT 214    Chemistries  Recent Labs  Lab 04/15/19 0534  NA 142  K 3.6  CL 114*  CO2 23  GLUCOSE 97  BUN 15  CREATININE 0.83  CALCIUM 8.0*     Microbiology Results  Results for orders placed or performed during the hospital encounter of 04/13/19  SARS Coronavirus 2 by RT PCR (hospital order, performed in [facility] hospital lab) Nasopharyngeal Nasopharyngeal Swab     Status: None   Collection Time: 04/13/19  8:14 PM   Specimen: Nasopharyngeal Swab  Result Value Ref Range Status   SARS Coronavirus 2 NEGATIVE NEGATIVE Final    Comment: (NOTE) If result is NEGATIVE SARS-CoV-2 target nucleic acids are NOT DETECTED. The SARS-CoV-2 RNA is generally detectable in upper and lower  respiratory specimens during the acute phase of infection. The lowest  concentration of SARS-CoV-2 viral copies this assay can detect is 250  copies / mL. A negative result does not preclude SARS-CoV-2 infection  and should not be used as the sole basis for treatment or other  patient management decisions.  A negative result may occur with  improper specimen collection / handling, submission of specimen other  than nasopharyngeal swab, presence of viral mutation(s) within the  areas targeted by this assay, and inadequate number of viral copies  (<250 copies / mL). A negative result must be combined with clinical  observations, patient history, and epidemiological information. If result is POSITIVE SARS-CoV-2 target nucleic acids are DETECTED. The SARS-CoV-2 RNA is generally detectable in  upper and lower  respiratory specimens dur ing the acute phase of infection.  Positive  results are indicative of active infection with SARS-CoV-2.  Clinical  correlation with patient history and other diagnostic information is  necessary to determine patient infection status.  Positive results do  not rule out bacterial infection or co-infection with other viruses. If result is PRESUMPTIVE POSTIVE SARS-CoV-2 nucleic acids MAY BE PRESENT.   A presumptive positive result was obtained on the submitted specimen  and confirmed on repeat testing.  While 2019 novel coronavirus  (SARS-CoV-2) nucleic acids may be present in the submitted sample  additional confirmatory testing may be necessary for epidemiological  and / or clinical management purposes  to differentiate between  SARS-CoV-2 and other Sarbecovirus currently  known to infect humans.  If clinically indicated additional testing with an alternate test  methodology [phone]) is advised. The SARS-CoV-2 RNA is generally  detectable in upper and lower respiratory sp ecimens during the acute  phase of infection. The expected result is Negative. Fact Sheet for Patients:  StrictlyIdeas.no Fact Sheet for Healthcare Providers: BankingDealers.co.za This test is not yet approved or cleared by the [place] FDA and has been authorized for detection and/or diagnosis of SARS-CoV-2 by FDA under an Emergency Use Authorization (EUA).  This EUA will remain in effect (meaning this test can be used) for the duration of the COVID-19 declaration under Section 564(b)(1) of the Act, 21 U.S.C. section 360bbb-3(b)(1), unless the authorization is terminated or revoked sooner. Performed at [facility], [place], [place] [address]   SARS CORONAVIRUS 2 (TAT 6-24 HRS) Nasopharyngeal Nasopharyngeal Swab     Status: Abnormal   Collection Time: 04/17/19 10:10 AM   Specimen: Nasopharyngeal  Swab  Result Value Ref Range Status   SARS Coronavirus 2 POSITIVE (A) NEGATIVE Final    Comment: RESULT CALLED TO, READ BACK BY AND VERIFIED WITH: EMAILED RESULTS TO [facility] [place] @ [id number] ON 04/17/19 BY ROBINSON Z.  (NOTE) SARS-CoV-2 target nucleic acids are DETECTED. The SARS-CoV-2 RNA is generally detectable in upper and lower respiratory specimens during the acute phase of infection. Positive results are indicative of active infection with SARS-CoV-2. Clinical  correlation with patient history and other diagnostic information is necessary to determine patient infection status. Positive results do  not rule out bacterial infection or co-infection with other viruses. The expected result is Negative. Fact Sheet for Patients: SugarRoll.be Fact Sheet for Healthcare Providers: https://www.woods-mathews.com/ This test is not yet approved or cleared by the [place] FDA and  has been authorized for detection and/or diagnosis of SARS-CoV-2 by FDA under an Emergency Use Authorization (EUA). This  EUA will remain  in effect (meaning this test can be used) for the duration of the COVID-19 declaration under Section 564(b)(1) of the Act, 21 U.S.C. section 360bbb-3(b)(1), unless the authorization is terminated or revoked sooner. Performed at [facility], [place] [address], [place], [place] [address]     RADIOLOGY:  Dg Chest Port 1 View  Result Date: 04/17/2019 CLINICAL DATA:  COVID-19 positivity EXAM: PORTABLE CHEST 1 VIEW COMPARISON:  03/28/2019 FINDINGS: Cardiac shadows within normal limits. The lungs are well aerated bilaterally. Mild stable interstitial changes are noted without focal infiltrate. No bony abnormality is noted. IMPRESSION: Chronic bronchitic changes without acute abnormality. Electronically Signed   By: Inez Catalina M.D.   On: 04/17/2019 20:41     Management plans discussed with the patient,  family and they are in agreement.  CODE STATUS: Full Code   TOTAL TIME TAKING CARE OF THIS PATIENT: 40 minutes.    Berna Spare  M.D on 04/18/2019 at 12:39 PM  Between 7am to 6pm - Pager - [phone]  After 6pm go to [email or web address] - Proofreader  [facility] [place] [facility]  Office  [phone]  CC: Primary care physician; Dion Body, MD   Note: This dictation was prepared with Dragon dictation along with smaller phrase technology. Any transcriptional errors that result from this process are unintentional.

## 2019-04-19 ENCOUNTER — Ambulatory Visit: Payer: Medicare Other | Admitting: Psychiatry

## 2019-05-19 ENCOUNTER — Encounter: Payer: Self-pay | Admitting: Emergency Medicine

## 2019-05-19 ENCOUNTER — Emergency Department
Admission: EM | Admit: 2019-05-19 | Discharge: 2019-05-24 | Disposition: A | Discharge status: Home / Self Care | Payer: Medicare Other | Attending: Emergency Medicine | Admitting: Emergency Medicine

## 2019-05-19 ENCOUNTER — Other Ambulatory Visit: Payer: Self-pay

## 2019-05-19 DIAGNOSIS — Z85831 Personal history of malignant neoplasm of soft tissue: Secondary | ICD-10-CM | POA: Insufficient documentation

## 2019-05-19 DIAGNOSIS — G20 Parkinson's disease: Secondary | ICD-10-CM | POA: Insufficient documentation

## 2019-05-19 DIAGNOSIS — I10 Essential (primary) hypertension: Secondary | ICD-10-CM | POA: Insufficient documentation

## 2019-05-19 DIAGNOSIS — J45909 Unspecified asthma, uncomplicated: Secondary | ICD-10-CM | POA: Diagnosis not present

## 2019-05-19 DIAGNOSIS — Z96643 Presence of artificial hip joint, bilateral: Secondary | ICD-10-CM | POA: Diagnosis not present

## 2019-05-19 DIAGNOSIS — G252 Other specified forms of tremor: Secondary | ICD-10-CM | POA: Diagnosis present

## 2019-05-19 DIAGNOSIS — F17210 Nicotine dependence, cigarettes, uncomplicated: Secondary | ICD-10-CM | POA: Diagnosis not present

## 2019-05-19 DIAGNOSIS — Z79899 Other long term (current) drug therapy: Secondary | ICD-10-CM | POA: Insufficient documentation

## 2019-05-19 DIAGNOSIS — R251 Tremor, unspecified: Secondary | ICD-10-CM

## 2019-05-19 LAB — CBC
HCT: 31.5 % — ABNORMAL LOW (ref 36.0–46.0)
Hemoglobin: 10.2 g/dL — ABNORMAL LOW (ref 12.0–15.0)
MCH: 29.9 pg (ref 26.0–34.0)
MCHC: 32.4 g/dL (ref 30.0–36.0)
MCV: 92.4 fL (ref 80.0–100.0)
Platelets: 216 10*3/uL (ref 150–400)
RBC: 3.41 MIL/uL — ABNORMAL LOW (ref 3.87–5.11)
RDW: 14.5 % (ref 11.5–15.5)
WBC: 6.9 10*3/uL (ref 4.0–10.5)
nRBC: 0 % (ref 0.0–0.2)

## 2019-05-19 LAB — BASIC METABOLIC PANEL
Anion gap: 14 (ref 5–15)
BUN: 11 mg/dL (ref 8–23)
CO2: 18 mmol/L — ABNORMAL LOW (ref 22–32)
Calcium: 8.7 mg/dL — ABNORMAL LOW (ref 8.9–10.3)
Chloride: 104 mmol/L (ref 98–111)
Creatinine, Ser: 1.07 mg/dL — ABNORMAL HIGH (ref 0.44–1.00)
GFR calc Af Amer: >60 mL/min (ref >60)
GFR calc non Af Amer: 55 mL/min — ABNORMAL LOW (ref >60)
Glucose, Bld: 62 mg/dL — ABNORMAL LOW (ref 70–99)
Potassium: 3.9 mmol/L (ref 3.5–5.1)
Sodium: 136 mmol/L (ref 135–145)

## 2019-05-19 MED ORDER — LORAZEPAM 0.5 MG PO TABS: 0.5000 mg | ORAL_TABLET | Freq: Every day | ORAL | 0 refills | Status: DC | PRN

## 2019-05-19 MED ORDER — VENLAFAXINE HCL ER 150 MG PO CP24
300.0000 mg | ORAL_CAPSULE | Freq: Every day | ORAL | Status: DC
  Administered 2019-05-20 – 2019-05-24 (×5): 300 mg via ORAL
  Filled 2019-05-19 (×6): qty 2

## 2019-05-19 MED ORDER — TRAZODONE HCL 100 MG PO TABS
300.0000 mg | ORAL_TABLET | Freq: Every evening | ORAL | Status: DC | PRN
  Administered 2019-05-19 – 2019-05-24 (×4): 300 mg via ORAL
  Filled 2019-05-19 (×4): qty 3

## 2019-05-19 MED ORDER — GABAPENTIN 300 MG PO CAPS
900.0000 mg | ORAL_CAPSULE | Freq: Three times a day (TID) | ORAL | Status: DC
  Administered 2019-05-19 – 2019-05-24 (×14): 900 mg via ORAL
  Filled 2019-05-19 (×14): qty 3

## 2019-05-19 MED ORDER — LORAZEPAM 1 MG PO TABS
1.0000 mg | ORAL_TABLET | Freq: Once | ORAL | Status: AC
  Administered 2019-05-19: 15:00:00 1 mg via ORAL
  Filled 2019-05-19: qty 1

## 2019-05-19 MED ORDER — CELECOXIB 200 MG PO CAPS
200.0000 mg | ORAL_CAPSULE | Freq: Two times a day (BID) | ORAL | Status: DC
  Administered 2019-05-19 – 2019-05-24 (×10): 200 mg via ORAL
  Filled 2019-05-19 (×12): qty 1

## 2019-05-19 MED ORDER — CALCIUM CARBONATE ANTACID 500 MG PO CHEW
500.0000 mg | CHEWABLE_TABLET | Freq: Every day | ORAL | Status: DC
  Administered 2019-05-20 – 2019-05-24 (×5): 500 mg via ORAL
  Filled 2019-05-19 (×5): qty 3

## 2019-05-19 MED ORDER — PROPRANOLOL HCL 20 MG PO TABS: 20.0000 mg | ORAL_TABLET | ORAL | Status: DC

## 2019-05-19 MED ORDER — CARBIDOPA-LEVODOPA 25-100 MG PO TABS
2.0000 | ORAL_TABLET | Freq: Three times a day (TID) | ORAL | Status: DC
  Administered 2019-05-19 – 2019-05-24 (×14): 2 via ORAL
  Filled 2019-05-19 (×18): qty 2

## 2019-05-19 MED ORDER — CALCIUM CARBONATE 1250 (500 CA) MG PO TABS
625.0000 mg | ORAL_TABLET | Freq: Every day | ORAL | Status: DC
  Filled 2019-05-19: qty 0.5

## 2019-05-19 NOTE — ED Notes (Signed)
Answered pts call bell. Aided pt with use of bedpan. Pt was able to urinate. Pts position readjusted in bed.

## 2019-05-19 NOTE — ED Provider Notes (Signed)
[facility] Emergency Department Provider Note  Time seen: 1:57 PM  I have reviewed the triage vital signs and the nursing notes.   HISTORY  Chief Complaint Tremors   HPI [name] is a 63 y.o. female with a past medical history of anxiety, bipolar, Parkinson's, status post recent left hip surgery, returned home from rehab on Monday and is coming to the emergency department today complaining of worsening Parkinson tremor and having trouble caring for herself at home.  According to the patient since going home from the rehab facility she has had worsening tremors.  States she sees Dr. Brigitte Pulse of neurology for her Parkinson's but has been unable to get into the office or call him due to the holidays.  Patient states she lives alone and is having trouble caring for self due to the tremor.  When asked specifically if she wants to be placed into a nursing facility or rehab once again she states no she wants to go home she is just hoping for medication to control her shaking better.  Denies any fever cough vomiting shortness of breath chest pain or abdominal pain.  States the left hip pain has been improving.  Past Medical History:  Diagnosis Date  . Anxiety   . Anxiety    panic anxiety disorder  . Arthritis   . Asthma    chronic asthmatic bronchitis  . Bipolar disorder ([place])   . Cancer [facility])    Desmoid tumor left forearm  . Depression   . ETOH abuse   . Heart murmur   . Insomnia   . Neuropathy     Patient Active Problem List   Diagnosis Date Noted  . AKI (acute kidney injury) ([place]) 04/13/2019  . Altered mental status   . ARF (acute renal failure) ([place]) 03/31/2019  . Back pain 03/16/2019  . Constipation 03/16/2019  . Parkinson disease ([place]) 03/16/2019  . Murmur 03/16/2019  . Neck pain 03/16/2019  . 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 Essential hypertension 02/22/2019  . At high risk for falls 01/25/2019  . Drug-induced tremor 01/25/2019  .  Essential tremor 01/25/2019  . 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 Displacement of internal left hip prosthesis ([place]) 11/29/2018  . Hip dislocation, left ([place]) 11/29/2018  . Hip dislocation, left, sequela 08/06/2018  . High anion gap metabolic acidosis 99991111  . History of alcohol abuse 10/09/2016  . Neuropathy of left upper extremity 10/09/2016  . Urinary incontinence, mixed 10/09/2016  . Overdose of antidepressant 07/03/2016  . 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 Alcohol use disorder, severe, dependence ([place]) 01/26/2015  . Tobacco use disorder 01/26/2015  . Nicotine dependence, uncomplicated [id number]  . Alcohol withdrawal ([place]) 01/08/2015  . 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 Suicidal ideation 12/19/2014  . Alcohol use 10/30/2014  . Anxiety 10/30/2014  . Depression 10/30/2014  . GAD (generalized anxiety disorder) 12/24/2013  . PTSD (post-traumatic stress disorder) 12/22/2013    Past Surgical History:  Procedure Laterality Date  . ABDOMINAL HYSTERECTOMY    . ABDOMINAL HYSTERECTOMY  2007  . 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 ARM AMPUTATION AT SHOULDER Left   . CESAREAN SECTION    . [place]   x 2  . COLONOSCOPY    . ESOPHAGOGASTRODUODENOSCOPY (EGD) WITH PROPOFOL N/A 04/27/2018   Procedure: ESOPHAGOGASTRODUODENOSCOPY (  EGD) WITH PROPOFOL;  Surgeon: Toledo, Benay Pike, MD;  Location: [facility] ENDOSCOPY;  Service: Gastroenterology;  Laterality: N/A;  . INCISION AND DRAINAGE Left 04/13/2019   Procedure: INCISION AND DRAINAGE;  Surgeon: Dereck Leep, MD;  Location: [facility] ORS;  Service: Orthopedics;  Laterality: Left;  . JOINT REPLACEMENT Right 2012   hip  . LUMBAR LAMINECTOMY/DECOMPRESSION MICRODISCECTOMY Left 04/16/2016   Procedure: LUMBAR LAMINECTOMY/DECOMPRESSION MICRODISCECTOMY 1 LEVEL;  Surgeon: Meade Maw, MD;  Location: [facility] ORS;  Service: Neurosurgery;  Laterality: Left;  Left L4-5 far lateral discectomy, left L4-5 laminoforaminotomy  . TOTAL HIP ARTHROPLASTY Left 09/17/2015   Procedure: TOTAL HIP ARTHROPLASTY;  Surgeon: Dereck Leep, MD;  Location: [facility] ORS;  Service: Orthopedics;  Laterality: Left;  . TOTAL HIP REVISION Left 03/16/2019   Procedure: TOTAL HIP REVISION ARTHROPLASTY WITH CONVERSION TO A CONSTRAINED LINER.;  Surgeon: Dereck Leep, MD;  Location: [facility] ORS;  Service: Orthopedics;  Laterality: Left;  . WISDOM TOOTH EXTRACTION      Prior to Admission medications   Medication Sig Start Date End Date Taking? Authorizing Provider  acetaminophen (TYLENOL) 500 MG tablet Take 1,000 mg by mouth every 6 (six) hours as needed for mild pain or fever.     [provider]  Amantadine HCl 100 MG tablet Take 200 mg by mouth 2 (two) times daily.  01/05/19   [provider]  benztropine (COGENTIN) 1 MG tablet Take 1 tablet (1 mg total) by mouth at bedtime for 7 days. 03/26/19 04/13/19  Gregor Hams, MD  calcium carbonate (OS-CAL) 600 MG TABS tablet Take 600 mg by mouth daily with breakfast.    [provider]  carbidopa-levodopa (SINEMET IR) 25-100 MG tablet Take 3 tablets by mouth 3 (three) times daily.     [provider]  celecoxib (CELEBREX) 200 MG capsule Take 1 capsule (200 mg total) by mouth 2 (two) times daily. 03/17/19   Reche Dixon, PA-C  chlordiazePOXIDE (LIBRIUM) 25 MG capsule Take 25 mg by mouth 3 (three) times daily.    [provider]  cyclobenzaprine (FLEXERIL) 5 MG tablet Take 5 mg by mouth 3 (three) times daily as needed for muscle spasms.    [provider]  docusate sodium (COLACE) 100 MG capsule Take 1 capsule (100 mg total) by mouth 2 (two) times daily. 04/18/19   Mayo, Pete Pelt, MD  enoxaparin (LOVENOX) 40 MG/0.4ML injection Inject 0.4 mLs (40 mg total) into the skin daily. For 14 days. 04/18/19   Mayo, Pete Pelt, MD  gabapentin (NEURONTIN) 300 MG capsule Take 3 capsules (900 mg total) by mouth 3 (three) times daily. 05/21/17   Pucilowska, [name], MD  oxyCODONE (OXY IR/ROXICODONE) 5 MG immediate release tablet Take 1 tablet (5 mg total) by mouth every 4 (four) hours as needed for moderate pain (pain score 4-6). 04/18/19   Mayo, Pete Pelt, MD  propranolol (INDERAL) 10 MG tablet Take 1 tablet (10 mg total) by mouth 3 (three) times daily. Patient taking differently: Take 10 mg by mouth 3 (three) times daily.  05/21/17   Pucilowska, Wardell Honour, MD  traMADol (ULTRAM) 50 MG tablet Take 1-2 tablets (50-100 mg total) by mouth every 4 (four) hours as needed for moderate pain. 04/18/19   Mayo, Pete Pelt, MD  traZODone (DESYREL) 150 MG tablet Take 150 mg by mouth at bedtime as needed for sleep.     [provider]  venlafaxine XR (EFFEXOR-XR) 150 MG 24 hr capsule Take 2 capsules (300 mg  total) by mouth daily with breakfast. 11/29/18   Fritzi Mandes, MD    Allergies  Allergen Reactions  . Benadryl [Diphenhydramine] Hives and Other (See Comments)    Reaction:  Hyperactivity   . Benadryl [Diphenhydramine] Other (See Comments)    My jaw locked and I could not speak, like a stroke.  . Cephalosporins Hives  . Codeine Nausea And Vomiting    Family History  Problem Relation Age of Onset  . COPD Mother     Social History Social History   Tobacco Use  . Smoking status: Current Every Day Smoker    Packs/day: 0.25    Types: Cigarettes  . Smokeless tobacco: Never Used  Substance Use Topics  . Alcohol use: Yes    Comment: 2 40oz beers daily  . Drug use: No    Comment: Patient denies     Review of Systems Constitutional: Negative for fever. Cardiovascular: Negative for chest pain. Respiratory: Negative for shortness of breath. Gastrointestinal: Negative for abdominal pain Musculoskeletal: Improving left hip pain Skin: Negative for skin complaints  Neurological: Negative for headache.   Worsening tremors. All other ROS negative  ____________________________________________   PHYSICAL EXAM:  VITAL SIGNS: ED Triage Vitals  Enc Vitals Group     BP 05/19/19 1319 (!) 142/61     Pulse Rate 05/19/19 1319 65     Resp 05/19/19 1319 18     Temp 05/19/19 1319 98.2 F (36.8 C)     Temp Source 05/19/19 1319 Oral     SpO2 05/19/19 1319 97 %     Weight 05/19/19 1320 129 lb (58.5 kg)     Height 05/19/19 1320 5\' 5"  (1.651 m)     Head Circumference --      Peak Flow --      Pain Score 05/19/19 1320 5     Pain Loc --      Pain Edu? --      Excl. in [place]? --     Constitutional: Alert and oriented. Well appearing and in no distress.  Patient does have a tremor mostly in the right upper extremity. Eyes: Normal exam ENT      Head: Normocephalic and atraumatic.      Mouth/Throat: Mucous membranes are moist. Cardiovascular: Normal rate, regular rhythm. Respiratory: Normal respiratory effort without tachypnea nor retractions. Breath sounds are clear Gastrointestinal: Soft and nontender. No distention. Musculoskeletal: Nontender with normal range of motion in all extremities.  Good range of motion left lower extremity.  Patient is status post left upper extremity amputation. Neurologic:  Normal speech and language. No gross focal neurologic deficits  Skin:  Skin is warm, dry and intact.  Psychiatric: Mood and affect are normal.  ____________________________________________    INITIAL IMPRESSION / ASSESSMENT AND PLAN / ED COURSE  Pertinent labs & imaging results that were available during my care of the patient were reviewed by me and considered in my medical decision making (see chart for details).   Patient presents to the emergency department for worsening tremor since going home from rehab on Monday.  Differential would include worsening tremor, electrolyte or metabolic abnormality, worsening Parkinson's symptoms.  We will attempt to discuss with neurology for recommendations  for the patient's tremor patient makes it very clear that she does not want to be admitted or placed into a rehab or skilled nursing facility.  We will check basic labs and consult with neurology for further recommendations.  I spoke to Dr. Irish Elders, he recommends holding off on making  any changes to the patient's Parkinson's medications until she can see Dr. Brigitte Pulse.  Patient is quite anxious I did discuss with the patient a trial of an anxiolytic medication.  I made sure the patient was comfortable taking this, discussed increased precautions with ambulation, avoiding alcohol, etc.  Patient agreeable to trial of this medication.  She will follow-up with neurology this week.  I discussed return precautions for the patient.  Patient's blood sugar is a little low states she has not eaten today, I again discussed rehab placement, she continues to states she does not want to go to a rehab facility.  We will feed the patient in the emergency department dose a small dose of Ativan and discharged with the same.  ELADIA RAMSEUR was evaluated in Emergency Department on 05/19/2019 for the symptoms described in the history of present illness. She was evaluated in the context of the global COVID-19 pandemic, which necessitated consideration that the patient might be at risk for infection with the SARS-CoV-2 virus that causes COVID-19. Institutional protocols and algorithms that pertain to the evaluation of patients at risk for COVID-19 are in a state of rapid change based on information released by regulatory bodies including the CDC and federal and state organizations. These policies and algorithms were followed during the patient's care in the ED.  ____________________________________________   FINAL CLINICAL IMPRESSION(S) / ED DIAGNOSES  Parkinson's tremor   Harvest Dark, MD 05/19/19 1443

## 2019-05-19 NOTE — ED Notes (Signed)
Introduced self to patient, patient is resting in bed without complaints. Patient wondering about home meds, checked with EDP and pharmacy will order shortly. Patient knows to call when bathroom is needed. Ate dinner with prior nurse

## 2019-05-19 NOTE — ED Notes (Signed)
Pt sat bedpan to use restroom, pt unable to ambulate to bathroom due to tremors.

## 2019-05-19 NOTE — ED Notes (Addendum)
Pt made aware of concerns of her being able to go home and take care of herself due to increase in tremors and inability to ambulate to bathroom, and provide nutrition for herself. Made aware that a consult for social work and PT have been put in, and aware that she will have to stay in the hospital overnight for consults tomorrow.

## 2019-05-19 NOTE — ED Notes (Signed)
Pt provided with warm blanket, states that she wants to get some rest. Denies any further needs at this time.

## 2019-05-19 NOTE — ED Notes (Signed)
Pt given meal tray, pt unable to feed self burger due to tremors. This nurse helped feed pt, pt able to pick up grapes and carrots with assistance.

## 2019-05-19 NOTE — ED Notes (Signed)
Patient repositioned in hospital bed, linens all changed. Patient denies having to use bathroom. Patient comfortable, clean socks applied. Patient took night medication, does not express any further needs. No complaints at this time, call bell in reach

## 2019-05-19 NOTE — ED Triage Notes (Addendum)
Patient from home via [facility]. Patient was released from [facility] on Monday after being there for rehab for a hip replacement. Patient states since being home, she has had worsening tremors. Patient states she has been diagnosed with parkinson's and has been taking medications as prescribed. Patient states she is unable to care for herself due to worsening tremors. Patient also reports 3 falls since Monday.   Patient tested positive for COVID on 10/25

## 2019-05-19 NOTE — ED Notes (Signed)
Patient provided with TV remote

## 2019-05-19 NOTE — ED Provider Notes (Signed)
Patient is now stating that she is interested in evaluation by physical therapy and social work for assistance with either home resources or even potentially placement.  As such, will obtain social work and PT consults.  Patient understands she will likely need to stay overnight for these to be accomplished, and is comfortable with this plan.   Lilia Pro., MD 05/19/19 [phone]

## 2019-05-19 NOTE — Discharge Instructions (Addendum)
Please follow-up with your neurologist Dr. Manuella Ghazi as soon as possible.  Return to the emergency department for any worsening tremor, if you are unable to adequately care for yourself at home, or any other symptom personally concerning to yourself.

## 2019-05-19 NOTE — ED Notes (Signed)
This nurse called pt daughter Teesha Gunnerson and made her aware of pt status and plan of care.

## 2019-05-19 NOTE — ED Notes (Signed)
Pt repositioned and given pillow. Denies any further needs at this time.

## 2019-05-20 DIAGNOSIS — G252 Other specified forms of tremor: Secondary | ICD-10-CM | POA: Diagnosis not present

## 2019-05-20 LAB — CBC WITH DIFFERENTIAL/PLATELET
Abs Immature Granulocytes: 0.03 10*3/uL (ref 0.00–0.07)
Basophils Absolute: 0 10*3/uL (ref 0.0–0.1)
Basophils Relative: 0 %
Eosinophils Absolute: 0.1 10*3/uL (ref 0.0–0.5)
Eosinophils Relative: 1 %
HCT: 29.3 % — ABNORMAL LOW (ref 36.0–46.0)
Hemoglobin: 9.8 g/dL — ABNORMAL LOW (ref 12.0–15.0)
Immature Granulocytes: 0 %
Lymphocytes Relative: 24 %
Lymphs Abs: 1.7 10*3/uL (ref 0.7–4.0)
MCH: 29.8 pg (ref 26.0–34.0)
MCHC: 33.4 g/dL (ref 30.0–36.0)
MCV: 89.1 fL (ref 80.0–100.0)
Monocytes Absolute: 0.8 10*3/uL (ref 0.1–1.0)
Monocytes Relative: 11 %
Neutro Abs: 4.5 10*3/uL (ref 1.7–7.7)
Neutrophils Relative %: 64 %
Platelets: 196 10*3/uL (ref 150–400)
RBC: 3.29 MIL/uL — ABNORMAL LOW (ref 3.87–5.11)
RDW: 14.3 % (ref 11.5–15.5)
WBC: 7.2 10*3/uL (ref 4.0–10.5)
nRBC: 0 % (ref 0.0–0.2)

## 2019-05-20 LAB — BASIC METABOLIC PANEL
Anion gap: 11 (ref 5–15)
BUN: 9 mg/dL (ref 8–23)
CO2: 21 mmol/L — ABNORMAL LOW (ref 22–32)
Calcium: 8.5 mg/dL — ABNORMAL LOW (ref 8.9–10.3)
Chloride: 104 mmol/L (ref 98–111)
Creatinine, Ser: 0.85 mg/dL (ref 0.44–1.00)
GFR calc Af Amer: >60 mL/min (ref >60)
GFR calc non Af Amer: >60 mL/min (ref >60)
Glucose, Bld: 96 mg/dL (ref 70–99)
Potassium: 3.7 mmol/L (ref 3.5–5.1)
Sodium: 136 mmol/L (ref 135–145)

## 2019-05-20 MED ORDER — LORAZEPAM 2 MG PO TABS
0.0000 mg | ORAL_TABLET | Freq: Four times a day (QID) | ORAL | Status: AC
  Administered 2019-05-20 (×2): 1 mg via ORAL
  Administered 2019-05-21: 18:00:00 2 mg via ORAL
  Administered 2019-05-21: 12:00:00 1 mg via ORAL
  Filled 2019-05-20 (×4): qty 1

## 2019-05-20 MED ORDER — LORAZEPAM 2 MG/ML IJ SOLN
0.0000 mg | Freq: Four times a day (QID) | INTRAMUSCULAR | Status: AC
  Administered 2019-05-21 – 2019-05-22 (×2): 2 mg via INTRAVENOUS
  Filled 2019-05-20 (×2): qty 1

## 2019-05-20 MED ORDER — LORAZEPAM 2 MG/ML IJ SOLN
0.0000 mg | Freq: Two times a day (BID) | INTRAMUSCULAR | Status: AC
  Administered 2019-05-22 – 2019-05-23 (×2): 2 mg via INTRAVENOUS
  Filled 2019-05-20 (×2): qty 1

## 2019-05-20 MED ORDER — AMANTADINE HCL 100 MG PO CAPS
100.0000 mg | ORAL_CAPSULE | Freq: Two times a day (BID) | ORAL | Status: DC
  Administered 2019-05-20 – 2019-05-24 (×9): 100 mg via ORAL
  Filled 2019-05-20 (×12): qty 1

## 2019-05-20 MED ORDER — PROPRANOLOL HCL 20 MG PO TABS
20.0000 mg | ORAL_TABLET | Freq: Three times a day (TID) | ORAL | Status: DC
  Administered 2019-05-20 – 2019-05-24 (×11): 20 mg via ORAL
  Filled 2019-05-20 (×12): qty 1

## 2019-05-20 MED ORDER — THIAMINE HCL 100 MG/ML IJ SOLN
100.0000 mg | Freq: Every day | INTRAMUSCULAR | Status: DC
  Filled 2019-05-20: qty 2

## 2019-05-20 MED ORDER — VITAMIN B-1 100 MG PO TABS
100.0000 mg | ORAL_TABLET | Freq: Every day | ORAL | Status: DC
  Administered 2019-05-20 – 2019-05-24 (×5): 100 mg via ORAL
  Filled 2019-05-20 (×5): qty 1

## 2019-05-20 MED ORDER — CHLORDIAZEPOXIDE HCL 25 MG PO CAPS
25.0000 mg | ORAL_CAPSULE | Freq: Once | ORAL | Status: AC
  Administered 2019-05-20: 25 mg via ORAL
  Filled 2019-05-20: qty 1

## 2019-05-20 MED ORDER — LORAZEPAM 2 MG PO TABS
0.0000 mg | ORAL_TABLET | Freq: Two times a day (BID) | ORAL | Status: AC
  Administered 2019-05-23 – 2019-05-24 (×2): 1 mg via ORAL
  Filled 2019-05-20 (×2): qty 1

## 2019-05-20 NOTE — ED Notes (Signed)
Patient asleep.

## 2019-05-20 NOTE — ED Notes (Signed)
Pt assisted to restroom by this RN and Theadora Rama, Therapist, sports. Pt required minimal assistance while ambulating to the restroom.

## 2019-05-20 NOTE — Evaluation (Signed)
Physical Therapy Evaluation Patient Details Name: [name] MRN: [id number] DOB: July 07, 1955 Today's Date: 05/20/2019   History of Present Illness  [name] is a 33yoF who comes to [facility] ED 11/26 worsening tremors and 3 falls. STR to home 11/23. Was at rehab s/p hip replacement. (+) COVID test 10/25. Hx PD tremors so bad interfering with ADL/self feeding. DC to [facility] on 10/26. Fall at [facility] c operative wound dehiscence undergoing I&D c Dr. Marry Guan 04/14/19. PMH: anxiety, asthma, bipolar disorder, cancer, LUE amputation, ETOH abuse, insomnia, neuropathy, and recent Lt posterior THA revision 03/16/19.  Clinical Impression  Pt admitted with above diagnosis. Pt currently with functional limitations due to the deficits listed below (see "PT Problem List"). Upon entry, pt in bed, awake and agreeable to participate. Pt somewhat HOH, difficulty responding to author's questions appropriately at time, and other times responses can be somewhat tangential. The pt is alert and oriented x3, pleasant, conversational, and generally a fair historian but has difficulty with medical details at times. Modified independent mobility to EOB/supine and to rise from EOB to standing with loftstrand crutch. Pt AMB 227ft ultimately limited by BLE fatigue, has intermittent 2-point gait c adequate/safe sequencing of device, but can easily lose sequencing and has gait ataxia for short periods. Pt has insight into her current deficits at home expressing concerns over how to feed her cats and change cat litter while maintaining balance, but she has difficulty problem solving these limitations finding a safer alternative method. Pt has poor social support, reliant on 'church friends' for transport to community in the past, but is not able to provide details at this time. Pt engaged in problem solving exercise urged to explain how she will overcome these safety hurdles and limitations, but she becomes anxious and fearful,  perseverating on her persistent refusal to return to STR/SNF. After extensive chart review, pt does appear to be mobilizing better than at other visits, but she reports to have more difficulty performing RUE ADL performance (pt is remotely s/p LUE amputation) as of late with worsening of RUE tremor. Author suspects extensive falls history is multifactorial and likely more a ramification of mild cognitive impairment in the setting of mild impulsivity. Functional mobility assessment demonstrates increased effort/time requirements, fair tolerance, and need for physical assistance, whereas the patient performed these at a higher level of independence PTA. Author has concerns for pt safety at home, recommending 24/7 supervision, but it does not sound as though this is available. Pt will benefit from skilled PT intervention to increase independence and safety with basic mobility in preparation for discharge to the venue listed below.       Follow Up Recommendations SNF;Supervision/Assistance - 24 hour;Supervision for mobility/OOB(Pt refusing recommendations for placement at this time.)    Equipment Recommendations  None recommended by PT(has extensive DME)    Recommendations for Other Services       Precautions / Restrictions Precautions Precautions: Posterior Hip Precaution Booklet Issued: Yes (comment) Restrictions Weight Bearing Restrictions: Yes LLE Weight Bearing: Weight bearing as tolerated Other Position/Activity Restrictions: Hip precautions/WB status taken from chart review from recent admission for L THA revision.      Mobility  Bed Mobility Overal bed mobility: Modified Independent       Supine to sit: Modified independent (Device/Increase time) Sit to supine: Modified independent (Device/Increase time)      Transfers Overall transfer level: Needs assistance Equipment used: (single lofstrand crutch in RUE) Transfers: Sit to/from Stand Sit to Stand: Min guard  General transfer comment: modeerate effort, no LOB  Ambulation/Gait   Gait Distance (Feet): 200 Feet Assistive device: Lofstrands(1 crutch RUE) Gait Pattern/deviations: Step-to pattern     General Gait Details: mostly 2-point gait with RUE lostrand sequenced with LLE; but when speaking and distracted she reverts to intermittent carrying of crutch and has mild instability; c/o legs feeling like walking '1000 miles'; intermittently unsteady  Stairs            Wheelchair Mobility    Modified Rankin (Stroke Patients Only)       Balance Overall balance assessment: Modified Independent;Mild deficits observed, not formally tested;History of Falls         Standing balance support: Single extremity supported;During functional activity Standing balance-Leahy Scale: Fair Standing balance comment: intermittently unsteady without gorss LOB                             Pertinent Vitals/Pain Pain Assessment: No/denies pain    Home Living Family/patient expects to be discharged to:: Skilled nursing facility Living Arrangements: Other (Comment) Available Help at Discharge: Friend(s);Available PRN/intermittently;Personal care attendant Type of Home: Apartment Home Access: Stairs to enter Entrance Stairs-Rails: Right;Left;Can reach both Entrance Stairs-Number of Steps: 3 Home Layout: One level Home Equipment: Bedside commode;Other (comment);Shower seat;Wheelchair - manual Additional Comments: Loftstrand crutch    Prior Function Level of Independence: Needs assistance         Comments: uses loft stand crutch recently, previously indep     Hand Dominance   Dominant Hand: Right    Extremity/Trunk Assessment   Upper Extremity Assessment Upper Extremity Assessment: Generalized weakness    Lower Extremity Assessment Lower Extremity Assessment: Generalized weakness    Cervical / Trunk Assessment Cervical / Trunk Assessment: Normal  Communication    Communication: No difficulties  Cognition Arousal/Alertness: Awake/alert Behavior During Therapy: Anxious;Impulsive;WFL for tasks assessed/performed Overall Cognitive Status: Within Functional Limits for tasks assessed                                        General Comments      Exercises     Assessment/Plan    PT Assessment Patient needs continued PT services  PT Problem List Decreased strength;Decreased activity tolerance;Decreased balance;Decreased mobility;Decreased knowledge of precautions       PT Treatment Interventions DME instruction;Gait training;Stair training;Functional mobility training;Therapeutic activities;Therapeutic exercise;Balance training;Patient/family education    PT Goals (Current goals can be found in the Care Plan section)  Acute Rehab PT Goals Patient Stated Goal: return to home, care for her cats PT Goal Formulation: Patient unable to participate in goal setting Time For Goal Achievement: 04/11/19 Potential to Achieve Goals: Fair    Frequency Min 2X/week   Barriers to discharge Inaccessible home environment      Co-evaluation               AM-PAC PT "6 Clicks" Mobility  Outcome Measure Help needed turning from your back to your side while in a flat bed without using bedrails?: None Help needed moving from lying on your back to sitting on the side of a flat bed without using bedrails?: None Help needed moving to and from a bed to a chair (including a wheelchair)?: A Little Help needed standing up from a chair using your arms (e.g., wheelchair or bedside chair)?: A Little Help needed to walk in hospital room?: A  Little Help needed climbing 3-5 steps with a railing? : A Little 6 Click Score: 20    End of Session Equipment Utilized During Treatment: Gait belt Activity Tolerance: No increased pain;Patient limited by fatigue Patient left: in bed;with call bell/phone within reach Nurse Communication: Mobility status PT  Visit Diagnosis: Unsteadiness on feet (R26.81);History of falling (Z91.81);Other abnormalities of gait and mobility (R26.89)    Time: 1208-1223 PT Time Calculation (min) (ACUTE ONLY): 15 min   Charges:   PT Evaluation $PT Eval Moderate Complexity: 1 Mod PT Treatments $Therapeutic Exercise: 8-22 mins        1:23 PM, 05/20/19 Etta Grandchild, PT, DPT Physical Therapist - [facility]  [phone] ([place])    Buccola,Allan C 05/20/2019, 1:06 PM

## 2019-05-20 NOTE — ED Provider Notes (Signed)
-----------------------------------------   5:07 PM on 05/20/2019 -----------------------------------------  I took over care on this patient from Dr. Quentin Cornwall.  The patient was initially being evaluated for rehab placement, but ended up declining placement and wanted to be discharged.  Social work was arranging for home health services.  However, it is unclear to me from the social work notes whether those services will be in place starting tomorrow.  On reassessment, the patient states that she does want to go home.  However, she agrees that she does not feel totally safe at home without any home health services.  She appears somewhat tremulous although her vital signs are normal.  She requests medication for her shaking and anxiety.  She states that Dr. Manuella Ghazi had prescribed her medication last week but that she was not able to get it before she left her prior facility, however I see that Dr. Manuella Ghazi wrote a prescription on 11/20 for an increased dose of propranolol rather than any new medication.  The patient states that this will not help her.  The repeat BMP is normal, and the patient is alert and oriented although does appear somewhat tremulous.  At this time, I would prefer not to discharge the patient until we can confirm that she will have services in place at home the next day.  The patient agrees to stay in the ED for now.  She is concerned about her cats and wants to find someone who can go to her house and feed them, so we will give her a phone to call family members.  ----------------------------------------- 10:33 PM on 05/20/2019 -----------------------------------------  The patient remains stable.  I will sign her out to the oncoming physician at 11 PM.  Plan will be likely discharge once social work can confirm that the patient will have home care.     Arta Silence, MD 05/20/19 [phone]

## 2019-05-20 NOTE — BH Assessment (Signed)
Writer spoke with ER MD, Dr. Quentin Cornwall, patient doesn't need to be seen by TTS. Order was entered in error.

## 2019-05-20 NOTE — Care Management (Addendum)
ED RN CM: Left VM message for daughter, Eular Christos attempting to get clarification on PCS provider and any home health history.   ED RN CM: Patient gave name/number for neighbor Leslie Dales who assists her as needed. Spoke with [place] who states she is concerned about patient discharging home due to extensive history of alcoholism and falls. Lovey Newcomer states son is primary Media planner, [name] @ [phone] and he lives in [place], [place]. Daughter lives in [place] and is not available to assist.   RN CM left VM message for son, [name] requesting callback.   Confirmed PCS services were from [facility]. Valla Leaver, RN [phone] with PCS services who states patient was discharged from these services when she went into SNF however they will attempt to locate a worker to assist her and call RN CM back.

## 2019-05-20 NOTE — ED Notes (Signed)
Pt ambulatory to bedside commode with noted difficulty. Pt able to stand independently but it took multiple attempts. Once walking with cane pt was independent but needed help with pants and then with standing off toilet again. Pt reporting increased fatigue with this effort.

## 2019-05-20 NOTE — ED Notes (Signed)
RN in room to give pts medication. Pt asked if she was going to see another MD today, pt informed that we are currently waiting on placement due to increased falls. Pt states that she does not want to be placed and that she would rather just go home and follow up with her Dr. [name] on Monday.

## 2019-05-20 NOTE — ED Notes (Signed)
Patient asleep, no complaints at present. Call bell within reach. NAD, vital signs stable. 

## 2019-05-20 NOTE — ED Notes (Signed)
Pt talking to daughter at this time.

## 2019-05-20 NOTE — Care Management (Addendum)
ED RN CM: Incoming call from [facility] with [facility] (PCS services) states approval for PCS services will need to come through [facility] @ [phone] before [facility] services can be resumed as patient was discharged from services due to placement.   ED RN CM: Attempted to outreach [employer] to request emergency reinstatement of PCS services however office is closed with no emergency services available. Will reopen on Monday.  ED RN CM: Incoming call from son, [name], states he is concerned about his mom being home alone but he is not able to assist in care as he lives 4 hours away and his sister lives [place]. Patient had neighbor who was helping but is not helping anymore. Unable to arrange PCS services until Monday due to holiday. Will attempt to arrange home health services.   ED RN CM: Call to [facility] to arrange home health services. [facility] agreed to nursing, therapy and aide services.

## 2019-05-20 NOTE — ED Provider Notes (Signed)
Patient has been recommended for placement but she is adamantly declining this and requesting discharge home.  Have placed order for home health.   Merlyn Lot, MD 05/20/19 [phone]

## 2019-05-20 NOTE — TOC Initial Note (Signed)
Transition of Care [facility]) - Initial/Assessment Note    Patient Details  Name: [name] MRN: [id number] Date of Birth: 1955/08/28  [facility] CM/SW Contact:    Anselm Pancoast, RN Phone Number: 05/20/2019, 11:13 AM  Clinical Narrative:                 63 year old female admitted with increased tremors from Parkinson's. Patient is refusing SNF placement stating she just left SNF and needs to get home to her cats. Patient has all needed DME including wheelchair, raised toilet seat and shower chair. Lives alone but has strong family and friends for support. Patient states she had worker who stayed with her for 2.5 hours five days a week prior to SNF stay and was working to get more hours. Patient cannot remember who her PCS services were through. Patient states she needs help with bathing, dressing and housework. RN CM reviewed difference in home health and PCS services. Patient verbalized understanding. RN CM expressed concerns for patient going home alone however patient adamant against returning to SNF placement and requesting home health and continued PCS services.   Expected Discharge Plan: [place]     Patient Goals and CMS Choice Patient states their goals for this hospitalization and ongoing recovery are:: Get home with her cats and home health assistance      Expected Discharge Plan and Services Expected Discharge Plan: [place]       Living arrangements for the past 2 months: [place], [place]                                      Prior Living Arrangements/Services Living arrangements for the past 2 months: [place], [place] Lives with:: Self Patient language and need for interpreter reviewed:: Yes Do you feel safe going back to the place where you live?: Yes      Need for Family Participation in Patient Care: Yes (Comment) Care giver support system in  place?: Yes (comment)   Criminal Activity/Legal Involvement Pertinent to Current Situation/Hospitalization: No - Comment as needed  Activities of Daily Living      Permission Sought/Granted Permission sought to share information with : Facility Sport and exercise psychologist, Case Optician, dispensing granted to share information with : Yes, Verbal Permission Granted  Share Information with NAME: Jamye Theune     Permission granted to share info w Relationship: Daughter     Emotional Assessment Appearance:: Appears stated age Attitude/Demeanor/Rapport: Engaged Affect (typically observed): Hopeful Orientation: : Oriented to Self, Oriented to Place, Oriented to  Time, Oriented to Situation   Psych Involvement: No (comment)  Admission diagnosis:  tremors Patient Active Problem List   Diagnosis Date Noted  . AKI (acute kidney injury) ([place]) 04/13/2019  . Altered mental status   . ARF (acute renal failure) ([place]) 03/31/2019  . Back pain 03/16/2019  . Constipation 03/16/2019  . Parkinson disease ([place]) 03/16/2019  . Murmur 03/16/2019  . Neck pain 03/16/2019  . 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 Essential hypertension 02/22/2019  . At high risk for falls 01/25/2019  . Drug-induced tremor 01/25/2019  . Essential tremor 01/25/2019  . 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 Displacement of internal left hip prosthesis ([place]) 11/29/2018  . Hip dislocation, left ([place]) 11/29/2018  .  Hip dislocation, left, sequela 08/06/2018  . High anion gap metabolic acidosis 99991111  . History of alcohol abuse 10/09/2016  . Neuropathy of left upper extremity 10/09/2016  . Urinary incontinence, mixed 10/09/2016  . Overdose of antidepressant 07/03/2016  . 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 Alcohol use disorder, severe, dependence ([place]) 01/26/2015  . Tobacco use disorder 01/26/2015  . Nicotine dependence,  uncomplicated [id number]  . Alcohol withdrawal ([place]) 01/08/2015  . 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 Suicidal ideation 12/19/2014  . Alcohol use 10/30/2014  . Anxiety 10/30/2014  . Depression 10/30/2014  . GAD (generalized anxiety disorder) 12/24/2013  . PTSD (post-traumatic stress disorder) 12/22/2013   PCP:  Dion Body, MD Pharmacy:   [place], [place] - [address] [place] [address] Phone: [phone] Fax: [phone]     Social Determinants of Health (SDOH) Interventions    Readmission Risk Interventions Readmission Risk Prevention Plan 04/15/2019 04/06/2019  Transportation Screening Complete Complete  PCP or Specialist Appt within 5-7 Days - Complete  Home Care Screening - Complete  Medication Review (RN CM) - Referral to Pharmacy  Palliative Care Screening Not Applicable -  Medication Review (RN Care Manager) Complete -  Some recent data might be hidden

## 2019-05-20 NOTE — ED Notes (Signed)
Pt given meal tray.

## 2019-05-20 NOTE — ED Notes (Signed)
Pt ambulated to toilet, NAD noted.

## 2019-05-20 NOTE — ED Notes (Addendum)
Pt daughter Zion Vonderhaar [id number]) reporting a family friend, Jerolyn Shin will be stopping by ED tomorrow to get pts house key so she can feed the cats. Pt is wearing house key on right wrist at this time.

## 2019-05-20 NOTE — ED Notes (Addendum)
1L O2 nasal cannula applied as patient desat to 88; resting comfortably. Denies needing to use restroom at this time

## 2019-05-21 DIAGNOSIS — G252 Other specified forms of tremor: Secondary | ICD-10-CM | POA: Diagnosis not present

## 2019-05-21 MED ORDER — ACETAMINOPHEN 325 MG PO TABS
650.0000 mg | ORAL_TABLET | Freq: Once | ORAL | Status: AC
  Administered 2019-05-21: 02:00:00 650 mg via ORAL
  Filled 2019-05-21: qty 2

## 2019-05-21 NOTE — ED Notes (Signed)
PT requesting nighttime trazadone for sleep and wants lights turned off. Pt denies any further needs at this time.

## 2019-05-21 NOTE — ED Notes (Signed)
Report received from [place], [place]. Pt is currently asleep in a hospital bed. Hospital bed locked and in lowest position. Pt has no complaints and NAD at this time. Will continue to monitor pt. Call light within reach.

## 2019-05-21 NOTE — ED Notes (Signed)
[name] stopped by to take the pt's keys to feed her cats.

## 2019-05-21 NOTE — ED Notes (Signed)
Pt requesting medication for her headache.

## 2019-05-21 NOTE — ED Notes (Signed)
Talked to pt's daughter, [name]. Daughter explained to the pt that the daughter's friend, Lavella Lemons will keep the pt's keys until she is discharged home. She also explained to her that we were waiting on the home health agency to d/c her home. Pt was slightly confused on why she was still here. Updated daughter about pt's status.

## 2019-05-21 NOTE — ED Provider Notes (Signed)
-----------------------------------------   5:16 AM on 05/21/2019 -----------------------------------------   Blood pressure 121/62, pulse (!) 110, temperature 98.4 F (36.9 C), temperature source Oral, resp. rate 16, height 5\' 5"  (1.651 m), weight 58.5 kg, SpO2 95 %.  The patient had no acute events since last update.  Calm and cooperative at this time.  Disposition is pending per Psychiatry/Behavioral Medicine team recommendations.     Rudene Re, MD 05/21/19 [phone]

## 2019-05-22 DIAGNOSIS — G252 Other specified forms of tremor: Secondary | ICD-10-CM | POA: Diagnosis not present

## 2019-05-22 MED ORDER — LORAZEPAM 2 MG/ML IJ SOLN
1.0000 mg | Freq: Once | INTRAMUSCULAR | Status: AC
  Administered 2019-05-22: 02:00:00 1 mg via INTRAVENOUS
  Filled 2019-05-22: qty 1

## 2019-05-22 NOTE — ED Notes (Signed)
Pt back to door and trying to walk into hallway again at this time. This RN and Statistician assisted pt back into bed.

## 2019-05-22 NOTE — ED Notes (Signed)
This RN to pt's bedside answering call light. Pt denies hitting call bell at this time.

## 2019-05-22 NOTE — ED Notes (Signed)
Pt to door of room without ambulation device and with steady gait. Pt told a third time that she cannot ambulate in the hall. Pt states that she understands what this RN is stating. Pt back into bed.

## 2019-05-22 NOTE — ED Notes (Addendum)
Pt ambulated to the toilet at this time using mobility device with minimal assistance.

## 2019-05-23 DIAGNOSIS — G252 Other specified forms of tremor: Secondary | ICD-10-CM | POA: Diagnosis not present

## 2019-05-23 MED ORDER — ONDANSETRON HCL 4 MG/2ML IJ SOLN
4.0000 mg | Freq: Once | INTRAMUSCULAR | Status: AC
  Administered 2019-05-23: 4 mg via INTRAVENOUS
  Filled 2019-05-23: qty 2

## 2019-05-23 NOTE — ED Notes (Signed)
Pt given breakfast tray and milk; seems more coherent now.

## 2019-05-23 NOTE — ED Notes (Signed)
Pt continues to make strange comments when this nurse enters the room. She most recently stated "I was not looking through your sock drawer." She also said she wanted to get dressed to go out. Pt also stated that she has had diarrhea and she WILL NOT go home with diarrhea. Pt has been assisted to the toilet several times and has not had diarrhea.

## 2019-05-23 NOTE — ED Notes (Signed)
Pt given lunch tray.

## 2019-05-23 NOTE — Care Management (Addendum)
ED RN CM: Left number for callback from [facility] @ [phone] to attempt emergency reinstatement of PCS services.   RN CM: Incoming call from [facility] @ [employer]. Advised DMA [id number] would need to be completed and faxed to expedited line @ [phone]. Form can also be pulled from [email or web address].

## 2019-05-23 NOTE — ED Notes (Signed)
Pt asking about her wheelchair she left in the bathroom. She is convinced that she had it a few minutes ago. I assured her that she has not had a wheelchair since I arrived at 7am

## 2019-05-23 NOTE — ED Notes (Signed)
Resumed care from [place], [place]. Pt resting in hospital bed.

## 2019-05-23 NOTE — ED Notes (Signed)
Pt was 88% on room air when vitals were checked, pt placed on 3L at thsi time. Sats improved to 96%

## 2019-05-23 NOTE — ED Notes (Signed)
Pt still will not eat. States she is not hungry.

## 2019-05-23 NOTE — ED Notes (Signed)
Pt does not know the date and is convinced that her son and friend have been waving at her through the window in the last few minutes. Pt redirected.

## 2019-05-23 NOTE — ED Notes (Signed)
Pt resting in bed, denies bathroom needs or any further needs at this time

## 2019-05-23 NOTE — ED Provider Notes (Signed)
Pt refusing snf. Working on additional resources at home. Family is aware and okay with being d/c home.    Vanessa Stonybrook, MD 05/23/19 1435

## 2019-05-23 NOTE — ED Notes (Signed)
Pt states she does not want any meal tray at this time

## 2019-05-23 NOTE — ED Notes (Addendum)
Pt states she doesn't feel like eating. This nurse encouraged her to try and eat, but she refuses. Lunch tray left for her. She states she may eat a few carrots.

## 2019-05-23 NOTE — ED Notes (Addendum)
Pt walking back to bed from toilet in room. Pt given warm blanket

## 2019-05-23 NOTE — ED Notes (Addendum)
This RN was in another room checking in EMS, when NT Lattie Haw came in room to let me know this pt had fallen trying to get out of bed. Pt had previous ambulated independently with no issue prior to the fall. Pt has cane at bedside. Call bell at bedside, all previously there prior to fall as well.  Pt currently denies pain, Pt was assisted back in bed by NT Lisa. Pt instructed to use call bell and to stay in bed. All assesments WNL.  Bed alarm on

## 2019-05-23 NOTE — Care Management (Addendum)
Application to resume PCS services completed and faxed to [facility] [id number].   Updated Margo at [facility] (previous [facility] provider). Anticipate discharge home soon. Audelia Acton states she will outreach to [employer] to expedite services.   Confirmed with Georgina Snell @ [facility] acceptance for home health services including PT, Nursing and aide. Anticipate HHC services starting Tuesday/Wednesday at the latest per [place].

## 2019-05-23 NOTE — ED Notes (Signed)
Heard pt yell and a bang in her room.  Went in and found the patient on her back on the floor.  Pt states she was getting up to "read some more". Assisted pt to her feet and back into bed.  Pt denies and pain and states "I'm fine".  RN and MD aware

## 2019-05-24 DIAGNOSIS — G252 Other specified forms of tremor: Secondary | ICD-10-CM | POA: Diagnosis not present

## 2019-05-24 MED ORDER — LORAZEPAM 0.5 MG PO TABS: 0.5000 mg | ORAL_TABLET | Freq: Every day | ORAL | 0 refills | Status: DC | PRN

## 2019-05-24 NOTE — ED Notes (Addendum)
Case Management to bedside at this time.

## 2019-05-24 NOTE — ED Provider Notes (Signed)
Patient will go home by taxi, home health has been established.   Earleen Newport, MD 05/24/19 [phone]

## 2019-05-24 NOTE — ED Notes (Signed)
Pt asleep at this time. Even unlabored breathing noted. Pt does not appear to be in any distress

## 2019-05-24 NOTE — ED Notes (Signed)
Pt asleep in bed, breathing WNL. Pt does not appear to be in any distress at this time

## 2019-05-24 NOTE — ED Provider Notes (Signed)
-----------------------------------------   4:57 AM on 05/24/2019 -----------------------------------------   Blood pressure 121/73, pulse (!) 57, temperature 98.2 F (36.8 C), resp. rate 16, height 1.651 m (5\' 5" ), weight 58.5 kg, SpO2 (!) 89 %.  The patient is calm and cooperative at this time.  Reportedly the patient is refusing SNF and family is okay with her going home.  Anticipate discharge today.   Hinda Kehr, MD 05/24/19 [phone]

## 2019-05-24 NOTE — ED Notes (Signed)
Lunch tray provided; patient able to feed self.

## 2019-05-24 NOTE — ED Notes (Signed)
Pt asleep at this time

## 2019-05-24 NOTE — ED Notes (Signed)
Pt up to toilet independently.

## 2019-05-24 NOTE — ED Notes (Addendum)
Pt sleeping in bed, chest rise and fall even and unlabored. Pts call bell at bedside, and bed alarm on

## 2019-05-24 NOTE — Care Management (Addendum)
RN CM: Discussed discharge plan with patient. Patient states she is nervous about going home but refusing SNF placement. Advised patient that we had arranged for resumption of PCS services and home health via [place]. Patient states she has no idea how she is going to get home.  RN CM contacted neighbor friend, Lovey Newcomer to ask for possible resource to get home and RN CM was told she, Lovey Newcomer, would not be available to assist.   RN CM will offer patient taxi as option to discharge home as patient is up walking independently around the room and appears safe to transport from taxi to her home.   RN CM: Discussed discharge home with patient who is in agreeance with taxi ride but does not have a house key since she gave it to [place], family friend, to feed the cats.   RN CM left message for daughter, [name], and son, [name] requesting callback with number for Lavella Lemons to get house keys for discharge.

## 2019-05-24 NOTE — ED Notes (Signed)
Pt ambulatory to toilet independently. 

## 2019-05-24 NOTE — Care Management (Addendum)
RN CM: Incoming call from [place] with [employer] requesting information regarding amount of help needed at home. States she will complete assessment and send over to [facility] @ [facility] for resumption of PCS services.   RN CM: Call to son, [name] to follow up on number needed for Tanya to get patients house keys. [name] states he will follow up with [name] and try to get number.   RN CM: Incoming call from [place] confirmed key is taped to the front door.   RNCM: Discussed final plans with patient for discharge. RN is arranging taxi transport. RN CM provided patient with clean dry clothes and confirmed CM had talked with daughter and house key is taped to front door. Patient verbalized understanding of key location.   RN CM: Incoming call from [place] @ [facility]. Confirmed PCS services will resume. RN CM sent text to [facility] updating discharge set for today.

## 2019-05-31 ENCOUNTER — Inpatient Hospital Stay: Payer: Medicare Other

## 2019-05-31 ENCOUNTER — Emergency Department: Payer: Medicare Other

## 2019-05-31 ENCOUNTER — Encounter: Payer: Self-pay | Admitting: Emergency Medicine

## 2019-05-31 ENCOUNTER — Other Ambulatory Visit: Payer: Self-pay

## 2019-05-31 ENCOUNTER — Inpatient Hospital Stay
Admission: EM | Admit: 2019-05-31 | Discharge: 2019-06-24 | DRG: 870 | Disposition: E | Payer: Medicare Other | Attending: Internal Medicine | Admitting: Internal Medicine

## 2019-05-31 DIAGNOSIS — Z515 Encounter for palliative care: Secondary | ICD-10-CM | POA: Diagnosis not present

## 2019-05-31 DIAGNOSIS — I429 Cardiomyopathy, unspecified: Secondary | ICD-10-CM | POA: Diagnosis present

## 2019-05-31 DIAGNOSIS — K7200 Acute and subacute hepatic failure without coma: Secondary | ICD-10-CM | POA: Diagnosis present

## 2019-05-31 DIAGNOSIS — E871 Hypo-osmolality and hyponatremia: Secondary | ICD-10-CM | POA: Diagnosis present

## 2019-05-31 DIAGNOSIS — J9601 Acute respiratory failure with hypoxia: Secondary | ICD-10-CM | POA: Diagnosis present

## 2019-05-31 DIAGNOSIS — K922 Gastrointestinal hemorrhage, unspecified: Secondary | ICD-10-CM | POA: Diagnosis present

## 2019-05-31 DIAGNOSIS — N390 Urinary tract infection, site not specified: Secondary | ICD-10-CM | POA: Diagnosis present

## 2019-05-31 DIAGNOSIS — N179 Acute kidney failure, unspecified: Secondary | ICD-10-CM

## 2019-05-31 DIAGNOSIS — B9620 Unspecified Escherichia coli [E. coli] as the cause of diseases classified elsewhere: Secondary | ICD-10-CM | POA: Diagnosis present

## 2019-05-31 DIAGNOSIS — R001 Bradycardia, unspecified: Secondary | ICD-10-CM | POA: Diagnosis not present

## 2019-05-31 DIAGNOSIS — Z9111 Patient's noncompliance with dietary regimen: Secondary | ICD-10-CM

## 2019-05-31 DIAGNOSIS — E860 Dehydration: Secondary | ICD-10-CM | POA: Diagnosis present

## 2019-05-31 DIAGNOSIS — R4182 Altered mental status, unspecified: Secondary | ICD-10-CM

## 2019-05-31 DIAGNOSIS — I480 Paroxysmal atrial fibrillation: Secondary | ICD-10-CM | POA: Diagnosis present

## 2019-05-31 DIAGNOSIS — F319 Bipolar disorder, unspecified: Secondary | ICD-10-CM | POA: Diagnosis present

## 2019-05-31 DIAGNOSIS — Z87440 Personal history of urinary (tract) infections: Secondary | ICD-10-CM

## 2019-05-31 DIAGNOSIS — D62 Acute posthemorrhagic anemia: Secondary | ICD-10-CM | POA: Diagnosis present

## 2019-05-31 DIAGNOSIS — F1020 Alcohol dependence, uncomplicated: Secondary | ICD-10-CM | POA: Diagnosis present

## 2019-05-31 DIAGNOSIS — G9340 Encephalopathy, unspecified: Secondary | ICD-10-CM | POA: Diagnosis not present

## 2019-05-31 DIAGNOSIS — E872 Acidosis: Secondary | ICD-10-CM | POA: Diagnosis present

## 2019-05-31 DIAGNOSIS — R6521 Severe sepsis with septic shock: Secondary | ICD-10-CM | POA: Diagnosis present

## 2019-05-31 DIAGNOSIS — I248 Other forms of acute ischemic heart disease: Secondary | ICD-10-CM | POA: Diagnosis present

## 2019-05-31 DIAGNOSIS — Z7189 Other specified counseling: Secondary | ICD-10-CM

## 2019-05-31 DIAGNOSIS — Z9119 Patient's noncompliance with other medical treatment and regimen: Secondary | ICD-10-CM

## 2019-05-31 DIAGNOSIS — E162 Hypoglycemia, unspecified: Secondary | ICD-10-CM | POA: Diagnosis present

## 2019-05-31 DIAGNOSIS — R188 Other ascites: Secondary | ICD-10-CM | POA: Diagnosis present

## 2019-05-31 DIAGNOSIS — G9341 Metabolic encephalopathy: Secondary | ICD-10-CM | POA: Diagnosis present

## 2019-05-31 DIAGNOSIS — E875 Hyperkalemia: Secondary | ICD-10-CM | POA: Diagnosis present

## 2019-05-31 DIAGNOSIS — Z452 Encounter for adjustment and management of vascular access device: Secondary | ICD-10-CM

## 2019-05-31 DIAGNOSIS — G931 Anoxic brain damage, not elsewhere classified: Secondary | ICD-10-CM | POA: Diagnosis present

## 2019-05-31 DIAGNOSIS — R404 Transient alteration of awareness: Secondary | ICD-10-CM | POA: Diagnosis not present

## 2019-05-31 DIAGNOSIS — D689 Coagulation defect, unspecified: Secondary | ICD-10-CM | POA: Diagnosis present

## 2019-05-31 DIAGNOSIS — R570 Cardiogenic shock: Secondary | ICD-10-CM | POA: Diagnosis not present

## 2019-05-31 DIAGNOSIS — I272 Pulmonary hypertension, unspecified: Secondary | ICD-10-CM | POA: Diagnosis present

## 2019-05-31 DIAGNOSIS — M6282 Rhabdomyolysis: Secondary | ICD-10-CM | POA: Diagnosis present

## 2019-05-31 DIAGNOSIS — Z66 Do not resuscitate: Secondary | ICD-10-CM

## 2019-05-31 DIAGNOSIS — N170 Acute kidney failure with tubular necrosis: Secondary | ICD-10-CM | POA: Diagnosis present

## 2019-05-31 DIAGNOSIS — Z01818 Encounter for other preprocedural examination: Secondary | ICD-10-CM

## 2019-05-31 DIAGNOSIS — Z8619 Personal history of other infectious and parasitic diseases: Secondary | ICD-10-CM

## 2019-05-31 DIAGNOSIS — A419 Sepsis, unspecified organism: Secondary | ICD-10-CM | POA: Diagnosis present

## 2019-05-31 DIAGNOSIS — Z90710 Acquired absence of both cervix and uterus: Secondary | ICD-10-CM

## 2019-05-31 DIAGNOSIS — Z79899 Other long term (current) drug therapy: Secondary | ICD-10-CM

## 2019-05-31 DIAGNOSIS — J9602 Acute respiratory failure with hypercapnia: Secondary | ICD-10-CM | POA: Diagnosis present

## 2019-05-31 DIAGNOSIS — K828 Other specified diseases of gallbladder: Secondary | ICD-10-CM | POA: Diagnosis present

## 2019-05-31 DIAGNOSIS — F419 Anxiety disorder, unspecified: Secondary | ICD-10-CM | POA: Diagnosis present

## 2019-05-31 DIAGNOSIS — I400 Infective myocarditis: Secondary | ICD-10-CM | POA: Diagnosis present

## 2019-05-31 DIAGNOSIS — F17210 Nicotine dependence, cigarettes, uncomplicated: Secondary | ICD-10-CM | POA: Diagnosis present

## 2019-05-31 DIAGNOSIS — K8020 Calculus of gallbladder without cholecystitis without obstruction: Secondary | ICD-10-CM | POA: Diagnosis present

## 2019-05-31 DIAGNOSIS — B9721 SARS-associated coronavirus as the cause of diseases classified elsewhere: Secondary | ICD-10-CM | POA: Diagnosis present

## 2019-05-31 DIAGNOSIS — I5021 Acute systolic (congestive) heart failure: Secondary | ICD-10-CM | POA: Diagnosis present

## 2019-05-31 DIAGNOSIS — I255 Ischemic cardiomyopathy: Secondary | ICD-10-CM | POA: Diagnosis present

## 2019-05-31 DIAGNOSIS — G20 Parkinson's disease: Secondary | ICD-10-CM | POA: Diagnosis present

## 2019-05-31 DIAGNOSIS — Z4659 Encounter for fitting and adjustment of other gastrointestinal appliance and device: Secondary | ICD-10-CM

## 2019-05-31 DIAGNOSIS — J9600 Acute respiratory failure, unspecified whether with hypoxia or hypercapnia: Secondary | ICD-10-CM

## 2019-05-31 DIAGNOSIS — Z96642 Presence of left artificial hip joint: Secondary | ICD-10-CM | POA: Diagnosis present

## 2019-05-31 DIAGNOSIS — I469 Cardiac arrest, cause unspecified: Secondary | ICD-10-CM | POA: Diagnosis not present

## 2019-05-31 DIAGNOSIS — Z789 Other specified health status: Secondary | ICD-10-CM

## 2019-05-31 DIAGNOSIS — R739 Hyperglycemia, unspecified: Secondary | ICD-10-CM | POA: Diagnosis not present

## 2019-05-31 LAB — CBC WITH DIFFERENTIAL/PLATELET
Abs Immature Granulocytes: 0.61 10*3/uL — ABNORMAL HIGH (ref 0.00–0.07)
Abs Immature Granulocytes: 1.31 10*3/uL — ABNORMAL HIGH (ref 0.00–0.07)
Basophils Absolute: 0.1 10*3/uL (ref 0.0–0.1)
Basophils Absolute: 0.1 10*3/uL (ref 0.0–0.1)
Basophils Relative: 0 %
Basophils Relative: 1 %
Eosinophils Absolute: 0 10*3/uL (ref 0.0–0.5)
Eosinophils Absolute: 0 10*3/uL (ref 0.0–0.5)
Eosinophils Relative: 0 %
Eosinophils Relative: 0 %
HCT: 36.4 % (ref 36.0–46.0)
HCT: 37.3 % (ref 36.0–46.0)
Hemoglobin: 11.6 g/dL — ABNORMAL LOW (ref 12.0–15.0)
Hemoglobin: 11.2 g/dL — ABNORMAL LOW (ref 12.0–15.0)
Immature Granulocytes: 4 %
Immature Granulocytes: 6 %
Lymphocytes Relative: 8 %
Lymphocytes Relative: 6 %
Lymphs Abs: 1.3 10*3/uL (ref 0.7–4.0)
Lymphs Abs: 1.4 10*3/uL (ref 0.7–4.0)
MCH: 29.1 pg (ref 26.0–34.0)
MCH: 29.7 pg (ref 26.0–34.0)
MCHC: 31.9 g/dL (ref 30.0–36.0)
MCHC: 30 g/dL (ref 30.0–36.0)
MCV: 91.2 fL (ref 80.0–100.0)
MCV: 98.9 fL (ref 80.0–100.0)
Monocytes Absolute: 1.7 10*3/uL — ABNORMAL HIGH (ref 0.1–1.0)
Monocytes Absolute: 1.9 10*3/uL — ABNORMAL HIGH (ref 0.1–1.0)
Monocytes Relative: 11 %
Monocytes Relative: 8 %
Neutro Abs: 12.4 10*3/uL — ABNORMAL HIGH (ref 1.7–7.7)
Neutro Abs: 18.3 10*3/uL — ABNORMAL HIGH (ref 1.7–7.7)
Neutrophils Relative %: 77 %
Neutrophils Relative %: 79 %
Platelets: 381 10*3/uL (ref 150–400)
Platelets: 244 10*3/uL (ref 150–400)
RBC: 3.99 MIL/uL (ref 3.87–5.11)
RBC: 3.77 MIL/uL — ABNORMAL LOW (ref 3.87–5.11)
RDW: 15.4 % (ref 11.5–15.5)
RDW: 15.6 % — ABNORMAL HIGH (ref 11.5–15.5)
WBC: 16 10*3/uL — ABNORMAL HIGH (ref 4.0–10.5)
WBC: 23.1 10*3/uL — ABNORMAL HIGH (ref 4.0–10.5)
nRBC: 0.4 % — ABNORMAL HIGH (ref 0.0–0.2)
nRBC: 0.7 % — ABNORMAL HIGH (ref 0.0–0.2)

## 2019-05-31 LAB — BLOOD GAS, VENOUS
Acid-base deficit: 16.3 mmol/L — ABNORMAL HIGH (ref 0.0–2.0)
Bicarbonate: 13.6 mmol/L — ABNORMAL LOW (ref 20.0–28.0)
O2 Saturation: 15.1 %
Patient temperature: 37
pCO2, Ven: 47 mmHg (ref 44.0–60.0)
pH, Ven: 7.07 — CL (ref 7.250–7.430)
pO2, Ven: <31 mmHg — CL (ref 32.0–45.0)

## 2019-05-31 LAB — COMPREHENSIVE METABOLIC PANEL
ALT: 12 U/L (ref 0–44)
ALT: 24 U/L (ref 0–44)
AST: 227 U/L — ABNORMAL HIGH (ref 15–41)
AST: 825 U/L — ABNORMAL HIGH (ref 15–41)
Albumin: 4.1 g/dL (ref 3.5–5.0)
Albumin: 3 g/dL — ABNORMAL LOW (ref 3.5–5.0)
Alkaline Phosphatase: 92 U/L (ref 38–126)
Alkaline Phosphatase: 111 U/L (ref 38–126)
Anion gap: 22 — ABNORMAL HIGH (ref 5–15)
Anion gap: 19 — ABNORMAL HIGH (ref 5–15)
BUN: 72 mg/dL — ABNORMAL HIGH (ref 8–23)
BUN: 68 mg/dL — ABNORMAL HIGH (ref 8–23)
CO2: 15 mmol/L — ABNORMAL LOW (ref 22–32)
CO2: 12 mmol/L — ABNORMAL LOW (ref 22–32)
Calcium: 8.9 mg/dL (ref 8.9–10.3)
Calcium: 7.9 mg/dL — ABNORMAL LOW (ref 8.9–10.3)
Chloride: 97 mmol/L — ABNORMAL LOW (ref 98–111)
Chloride: 106 mmol/L (ref 98–111)
Creatinine, Ser: 2.09 mg/dL — ABNORMAL HIGH (ref 0.44–1.00)
Creatinine, Ser: 2.04 mg/dL — ABNORMAL HIGH (ref 0.44–1.00)
GFR calc Af Amer: 28 mL/min — ABNORMAL LOW (ref >60)
GFR calc Af Amer: 29 mL/min — ABNORMAL LOW (ref >60)
GFR calc non Af Amer: 24 mL/min — ABNORMAL LOW (ref >60)
GFR calc non Af Amer: 25 mL/min — ABNORMAL LOW (ref >60)
Glucose, Bld: 56 mg/dL — ABNORMAL LOW (ref 70–99)
Glucose, Bld: 184 mg/dL — ABNORMAL HIGH (ref 70–99)
Potassium: 5.6 mmol/L — ABNORMAL HIGH (ref 3.5–5.1)
Potassium: 5.7 mmol/L — ABNORMAL HIGH (ref 3.5–5.1)
Sodium: 134 mmol/L — ABNORMAL LOW (ref 135–145)
Sodium: 137 mmol/L (ref 135–145)
Total Bilirubin: 2.9 mg/dL — ABNORMAL HIGH (ref 0.3–1.2)
Total Bilirubin: 3.4 mg/dL — ABNORMAL HIGH (ref 0.3–1.2)
Total Protein: 6.7 g/dL (ref 6.5–8.1)
Total Protein: 5.3 g/dL — ABNORMAL LOW (ref 6.5–8.1)

## 2019-05-31 LAB — URINALYSIS, COMPLETE (UACMP) WITH MICROSCOPIC
Bilirubin Urine: NEGATIVE
Glucose, UA: NEGATIVE mg/dL
Hgb urine dipstick: NEGATIVE
Ketones, ur: 5 mg/dL — AB
Leukocytes,Ua: NEGATIVE
Nitrite: NEGATIVE
Protein, ur: >=300 mg/dL — AB
Specific Gravity, Urine: 1.019 (ref 1.005–1.030)
pH: 7 (ref 5.0–8.0)

## 2019-05-31 LAB — URINE DRUG SCREEN, QUALITATIVE (ARMC ONLY)
Amphetamines, Ur Screen: NOT DETECTED
Barbiturates, Ur Screen: NOT DETECTED
Benzodiazepine, Ur Scrn: POSITIVE — AB
Cannabinoid 50 Ng, Ur ~~LOC~~: NOT DETECTED
Cocaine Metabolite,Ur ~~LOC~~: NOT DETECTED
MDMA (Ecstasy)Ur Screen: NOT DETECTED
Methadone Scn, Ur: NOT DETECTED
Opiate, Ur Screen: NOT DETECTED
Phencyclidine (PCP) Ur S: NOT DETECTED
Tricyclic, Ur Screen: NOT DETECTED

## 2019-05-31 LAB — BLOOD GAS, ARTERIAL
Acid-base deficit: 19.6 mmol/L — ABNORMAL HIGH (ref 0.0–2.0)
Bicarbonate: 9.6 mmol/L — ABNORMAL LOW (ref 20.0–28.0)
FIO2: 1
MECHVT: 400 mL
Mechanical Rate: 16
O2 Saturation: 99.9 %
PEEP: 8 cmH2O
Patient temperature: 37
pCO2 arterial: 33 mmHg (ref 32.0–48.0)
pH, Arterial: 7.07 — CL (ref 7.350–7.450)
pO2, Arterial: 348 mmHg — ABNORMAL HIGH (ref 83.0–108.0)

## 2019-05-31 LAB — GLUCOSE, CAPILLARY
Glucose-Capillary: 104 mg/dL — ABNORMAL HIGH (ref 70–99)
Glucose-Capillary: 40 mg/dL — CL (ref 70–99)
Glucose-Capillary: 155 mg/dL — ABNORMAL HIGH (ref 70–99)
Glucose-Capillary: 79 mg/dL (ref 70–99)
Glucose-Capillary: 80 mg/dL (ref 70–99)

## 2019-05-31 LAB — PROCALCITONIN: Procalcitonin: 0.3 ng/mL

## 2019-05-31 LAB — RESPIRATORY PANEL BY RT PCR (FLU A&B, COVID)
Influenza A by PCR: NEGATIVE
Influenza B by PCR: NEGATIVE
SARS Coronavirus 2 by RT PCR: NEGATIVE

## 2019-05-31 LAB — ACETAMINOPHEN LEVEL: Acetaminophen (Tylenol), Serum: <10 ug/mL — ABNORMAL LOW (ref 10–30)

## 2019-05-31 LAB — AMMONIA: Ammonia: 68 umol/L — ABNORMAL HIGH (ref 9–35)

## 2019-05-31 LAB — CK: Total CK: 3445 U/L — ABNORMAL HIGH (ref 38–234)

## 2019-05-31 LAB — LIPASE, BLOOD: Lipase: 18 U/L (ref 11–51)

## 2019-05-31 LAB — SALICYLATE LEVEL: Salicylate Lvl: <7 mg/dL (ref 2.8–30.0)

## 2019-05-31 LAB — ETHANOL: Alcohol, Ethyl (B): <10 mg/dL (ref <10)

## 2019-05-31 LAB — SARS CORONAVIRUS 2 (TAT 6-24 HRS): SARS Coronavirus 2: NEGATIVE

## 2019-05-31 LAB — PREPARE RBC (CROSSMATCH)

## 2019-05-31 MED ORDER — DOPAMINE-DEXTROSE 3.2-5 MG/ML-% IV SOLN
0.0000 ug/kg/min | INTRAVENOUS | Status: DC
  Administered 2019-05-31: 20 ug/kg/min via INTRAVENOUS
  Filled 2019-05-31: qty 250

## 2019-05-31 MED ORDER — ROCURONIUM BROMIDE 50 MG/5ML IV SOLN
80.0000 mg | Freq: Once | INTRAVENOUS | Status: AC
  Administered 2019-05-31: 80 mg via INTRAVENOUS
  Filled 2019-05-31: qty 8

## 2019-05-31 MED ORDER — ONDANSETRON HCL 4 MG/2ML IJ SOLN: 4.0000 mg | Freq: Four times a day (QID) | INTRAMUSCULAR | Status: DC | PRN

## 2019-05-31 MED ORDER — DEXMEDETOMIDINE HCL IN NACL 400 MCG/100ML IV SOLN
0.0000 ug/kg/h | INTRAVENOUS | Status: AC
  Administered 2019-05-31: 0.4 ug/kg/h via INTRAVENOUS
  Filled 2019-05-31: qty 100

## 2019-05-31 MED ORDER — PIPERACILLIN-TAZOBACTAM 3.375 G IVPB
3.3750 g | Freq: Three times a day (TID) | INTRAVENOUS | Status: DC
  Administered 2019-06-01 – 2019-06-02 (×6): 3.375 g via INTRAVENOUS
  Filled 2019-05-31 (×6): qty 50

## 2019-05-31 MED ORDER — FENTANYL CITRATE (PF) 100 MCG/2ML IJ SOLN
25.0000 ug | INTRAMUSCULAR | Status: DC | PRN
  Administered 2019-06-03: 25 ug via INTRAVENOUS

## 2019-05-31 MED ORDER — INSULIN ASPART 100 UNIT/ML IV SOLN
5.0000 [IU] | Freq: Once | INTRAVENOUS | Status: DC
  Filled 2019-05-31: qty 0.05

## 2019-05-31 MED ORDER — SODIUM CHLORIDE 0.9% FLUSH
3.0000 mL | Freq: Two times a day (BID) | INTRAVENOUS | Status: DC
  Administered 2019-05-31 – 2019-06-03 (×8): 3 mL via INTRAVENOUS
  Administered 2019-06-04: 10 mL via INTRAVENOUS
  Administered 2019-06-04 – 2019-06-05 (×2): 3 mL via INTRAVENOUS

## 2019-05-31 MED ORDER — ACETAMINOPHEN 650 MG RE SUPP: 650.0000 mg | Freq: Four times a day (QID) | RECTAL | Status: DC | PRN

## 2019-05-31 MED ORDER — SODIUM BICARBONATE 8.4 % IV SOLN
INTRAVENOUS | Status: AC
  Administered 2019-05-31: 50 meq via INTRAVENOUS
  Filled 2019-05-31: qty 100

## 2019-05-31 MED ORDER — VENLAFAXINE HCL ER 75 MG PO CP24
300.0000 mg | ORAL_CAPSULE | Freq: Every day | ORAL | Status: DC
  Administered 2019-06-03: 300 mg via ORAL
  Filled 2019-05-31: qty 4
  Filled 2019-05-31: qty 2
  Filled 2019-05-31: qty 4

## 2019-05-31 MED ORDER — CALCIUM GLUCONATE 10 % IV SOLN
1.0000 g | Freq: Once | INTRAVENOUS | Status: AC
  Administered 2019-05-31: 1 g via INTRAVENOUS
  Filled 2019-05-31: qty 10

## 2019-05-31 MED ORDER — PROPRANOLOL HCL 20 MG PO TABS
30.0000 mg | ORAL_TABLET | Freq: Two times a day (BID) | ORAL | Status: DC
  Filled 2019-05-31: qty 1

## 2019-05-31 MED ORDER — SODIUM CHLORIDE 0.9 % IV BOLUS: 1000.0000 mL | Freq: Once | INTRAVENOUS | Status: DC

## 2019-05-31 MED ORDER — SENNOSIDES-DOCUSATE SODIUM 8.6-50 MG PO TABS
1.0000 | ORAL_TABLET | Freq: Every evening | ORAL | Status: DC | PRN
  Filled 2019-05-31: qty 1

## 2019-05-31 MED ORDER — SODIUM CHLORIDE 0.9% IV SOLUTION
Freq: Once | INTRAVENOUS | Status: DC
  Filled 2019-05-31: qty 250

## 2019-05-31 MED ORDER — ATROPINE SULFATE 1 MG/ML IJ SOLN
1.0000 mg | Freq: Once | INTRAMUSCULAR | Status: AC
  Administered 2019-05-31: 22:00:00 1 mg via INTRAVENOUS

## 2019-05-31 MED ORDER — HEPARIN SODIUM (PORCINE) 5000 UNIT/ML IJ SOLN
5000.0000 [IU] | Freq: Three times a day (TID) | INTRAMUSCULAR | Status: DC
  Administered 2019-05-31: 5000 [IU] via SUBCUTANEOUS
  Filled 2019-05-31: qty 1

## 2019-05-31 MED ORDER — LACTATED RINGERS IV BOLUS
1000.0000 mL | Freq: Once | INTRAVENOUS | Status: AC
  Administered 2019-05-31: 1000 mL via INTRAVENOUS

## 2019-05-31 MED ORDER — ACETAMINOPHEN 325 MG PO TABS: 650.0000 mg | ORAL_TABLET | Freq: Four times a day (QID) | ORAL | Status: DC | PRN

## 2019-05-31 MED ORDER — SODIUM CHLORIDE 0.9 % IV SOLN
8.0000 mg/h | INTRAVENOUS | Status: AC
  Administered 2019-06-01 – 2019-06-03 (×7): 8 mg/h via INTRAVENOUS
  Filled 2019-05-31 (×8): qty 80

## 2019-05-31 MED ORDER — SODIUM BICARBONATE 8.4 % IV SOLN
50.0000 meq | Freq: Once | INTRAVENOUS | Status: AC
  Administered 2019-05-31: 50 meq via INTRAVENOUS
  Filled 2019-05-31: qty 50

## 2019-05-31 MED ORDER — SODIUM BICARBONATE 8.4 % IV SOLN
50.0000 meq | Freq: Once | INTRAVENOUS | Status: AC
  Administered 2019-05-31: 50 meq via INTRAVENOUS

## 2019-05-31 MED ORDER — ALBUTEROL SULFATE (2.5 MG/3ML) 0.083% IN NEBU
10.0000 mg | INHALATION_SOLUTION | Freq: Once | RESPIRATORY_TRACT | Status: AC
  Administered 2019-05-31: 10 mg via RESPIRATORY_TRACT
  Filled 2019-05-31: qty 12

## 2019-05-31 MED ORDER — CHLORDIAZEPOXIDE HCL 25 MG PO CAPS
25.0000 mg | ORAL_CAPSULE | Freq: Three times a day (TID) | ORAL | Status: DC
  Administered 2019-05-31: 25 mg via ORAL
  Filled 2019-05-31: qty 1

## 2019-05-31 MED ORDER — NOREPINEPHRINE 4 MG/250ML-% IV SOLN
0.0000 ug/min | INTRAVENOUS | Status: DC
  Filled 2019-05-31: qty 250

## 2019-05-31 MED ORDER — INSULIN ASPART 100 UNIT/ML ~~LOC~~ SOLN
5.0000 [IU] | Freq: Once | SUBCUTANEOUS | Status: AC
  Administered 2019-05-31: 5 [IU] via INTRAVENOUS
  Filled 2019-05-31: qty 1

## 2019-05-31 MED ORDER — FENTANYL CITRATE (PF) 100 MCG/2ML IJ SOLN
25.0000 ug | INTRAMUSCULAR | Status: DC | PRN
  Filled 2019-05-31: qty 2

## 2019-05-31 MED ORDER — IPRATROPIUM-ALBUTEROL 0.5-2.5 (3) MG/3ML IN SOLN: 3.0000 mL | RESPIRATORY_TRACT | Status: DC | PRN

## 2019-05-31 MED ORDER — ATROPINE SULFATE 1 MG/10ML IJ SOSY: 1.0000 mg | PREFILLED_SYRINGE | INTRAMUSCULAR | Status: DC | PRN

## 2019-05-31 MED ORDER — ATROPINE SULFATE 1 MG/10ML IJ SOSY
PREFILLED_SYRINGE | INTRAMUSCULAR | Status: AC
  Administered 2019-05-31: 1 mg
  Filled 2019-05-31: qty 10

## 2019-05-31 MED ORDER — SODIUM CHLORIDE 0.9 % IV SOLN: INTRAVENOUS | Status: DC

## 2019-05-31 MED ORDER — NOREPINEPHRINE BITARTRATE 1 MG/ML IV SOLN
0.0000 ug/min | INTRAVENOUS | Status: DC
  Administered 2019-05-31: 2 ug/min via INTRAVENOUS
  Administered 2019-06-01: 4 ug/min via INTRAVENOUS
  Filled 2019-05-31 (×2): qty 4

## 2019-05-31 MED ORDER — ETOMIDATE 2 MG/ML IV SOLN
20.0000 mg | Freq: Once | INTRAVENOUS | Status: AC
  Administered 2019-05-31: 20 mg via INTRAVENOUS

## 2019-05-31 MED ORDER — CHLORHEXIDINE GLUCONATE CLOTH 2 % EX PADS
6.0000 | MEDICATED_PAD | Freq: Every day | CUTANEOUS | Status: DC
  Administered 2019-06-01 – 2019-06-05 (×5): 6 via TOPICAL

## 2019-05-31 MED ORDER — SODIUM CHLORIDE 0.9 % IV BOLUS
1000.0000 mL | Freq: Once | INTRAVENOUS | Status: AC
  Administered 2019-05-31: 1000 mL via INTRAVENOUS

## 2019-05-31 MED ORDER — PROPRANOLOL HCL 20 MG PO TABS: 20.0000 mg | ORAL_TABLET | Freq: Every day | ORAL | Status: DC

## 2019-05-31 MED ORDER — SODIUM CHLORIDE 0.9 % IV SOLN
80.0000 mg | Freq: Once | INTRAVENOUS | Status: DC
  Filled 2019-05-31: qty 80

## 2019-05-31 MED ORDER — LORAZEPAM 2 MG/ML IJ SOLN
1.0000 mg | Freq: Once | INTRAMUSCULAR | Status: DC
  Filled 2019-05-31: qty 1

## 2019-05-31 MED ORDER — ATROPINE SULFATE 1 MG/ML IJ SOLN: 0.5000 mg | Freq: Once | INTRAMUSCULAR | Status: DC

## 2019-05-31 MED ORDER — DEXTROSE 50 % IV SOLN
INTRAVENOUS | Status: AC
  Administered 2019-05-31: 18:00:00
  Filled 2019-05-31: qty 50

## 2019-05-31 MED ORDER — DEXTROSE 50 % IV SOLN
1.0000 | Freq: Once | INTRAVENOUS | Status: AC
  Administered 2019-05-31: 50 mL via INTRAVENOUS
  Filled 2019-05-31: qty 50

## 2019-05-31 MED ORDER — ATROPINE SULFATE 1 MG/10ML IJ SOSY
PREFILLED_SYRINGE | INTRAMUSCULAR | Status: AC
  Filled 2019-05-31: qty 10

## 2019-05-31 MED ORDER — PROPRANOLOL HCL 20 MG PO TABS: 20.0000 mg | ORAL_TABLET | ORAL | Status: DC

## 2019-05-31 MED ORDER — DEXTROSE-NACL 5-0.9 % IV SOLN
INTRAVENOUS | Status: DC
  Administered 2019-05-31: 18:00:00 via INTRAVENOUS

## 2019-05-31 MED ORDER — PANTOPRAZOLE SODIUM 40 MG IV SOLR
40.0000 mg | Freq: Two times a day (BID) | INTRAVENOUS | Status: DC
  Administered 2019-06-04 – 2019-06-05 (×3): 40 mg via INTRAVENOUS
  Filled 2019-05-31 (×3): qty 40

## 2019-05-31 MED ORDER — ONDANSETRON HCL 4 MG PO TABS
4.0000 mg | ORAL_TABLET | Freq: Four times a day (QID) | ORAL | Status: DC | PRN
  Filled 2019-05-31: qty 1

## 2019-05-31 MED ORDER — SODIUM BICARBONATE-DEXTROSE 150-5 MEQ/L-% IV SOLN
150.0000 meq | INTRAVENOUS | Status: DC
  Administered 2019-05-31: 150 meq via INTRAVENOUS
  Filled 2019-05-31 (×2): qty 1000

## 2019-05-31 MED ORDER — GLUCAGON HCL RDNA (DIAGNOSTIC) 1 MG IJ SOLR
1.0000 mg | Freq: Once | INTRAMUSCULAR | Status: AC
  Administered 2019-05-31: 1 mg via INTRAVENOUS

## 2019-05-31 NOTE — ED Notes (Signed)
Attempted to start IV twice unsuccessfully

## 2019-05-31 NOTE — Consult Note (Signed)
[name] Cardiology  CARDIOLOGY CONSULT NOTE  Patient ID: [name] MRN: [id number] DOB/AGE: [birth year] 63 y.o.  Admit date: 06/15/2019 Referring Physician Izetta Dakin Primary Physician [facility] Primary Cardiologist  Reason for Consultation atrial fibrillation  HPI: 63 year old female referred for evaluation of atrial fibrillation.  Patient has a history of Parkinson syndrome, EtOH abuse, medical noncompliance.  The patient is admitted with decreased level of consciousness, dehydration, acute kidney injury and elevated white count.  BUN and creatinine were 72 and 2.09, respectively.  White count was 16,000.  ECG reveals apparent atrial fibrillation at a rate of 57 bpm.  The patient had a very similar hospitalization 03/31/2019 with acute kidney injury, dehydration, and leukocytosis.  Upon questioning, the patient appears very confused, but denies chest pain or shortness of breath, unsure why she is here.  Patient is hemodynamically stable, blood pressure 130/68.  Head CT is unremarkable.  Patient apparently lives at home, alone, with home health.  The patient is clearly confused, unsure of medications she is taking.  Review of systems complete and found to be negative unless listed above     Past Medical History:  Diagnosis Date  . Anxiety   . Anxiety    panic anxiety disorder  . Arthritis   . Asthma    chronic asthmatic bronchitis  . Bipolar disorder ([place])   . Cancer [name])    Desmoid tumor left forearm  . Depression   . ETOH abuse   . Heart murmur   . Insomnia   . Neuropathy     Past Surgical History:  Procedure Laterality Date  . ABDOMINAL HYSTERECTOMY    . ABDOMINAL HYSTERECTOMY  2007  . 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 ARM AMPUTATION AT SHOULDER Left   . CESAREAN SECTION    . [place]   x 2  . COLONOSCOPY    . ESOPHAGOGASTRODUODENOSCOPY (EGD) WITH PROPOFOL N/A 04/27/2018   Procedure: ESOPHAGOGASTRODUODENOSCOPY (EGD) WITH  PROPOFOL;  Surgeon: Toledo, Benay Pike, MD;  Location: [facility] ENDOSCOPY;  Service: Gastroenterology;  Laterality: N/A;  . INCISION AND DRAINAGE Left 04/13/2019   Procedure: INCISION AND DRAINAGE;  Surgeon: Dereck Leep, MD;  Location: [facility] ORS;  Service: Orthopedics;  Laterality: Left;  . JOINT REPLACEMENT Right 2012   hip  . LUMBAR LAMINECTOMY/DECOMPRESSION MICRODISCECTOMY Left 04/16/2016   Procedure: LUMBAR LAMINECTOMY/DECOMPRESSION MICRODISCECTOMY 1 LEVEL;  Surgeon: Meade Maw, MD;  Location: [facility] ORS;  Service: Neurosurgery;  Laterality: Left;  Left L4-5 far lateral discectomy, left L4-5 laminoforaminotomy  . TOTAL HIP ARTHROPLASTY Left 09/17/2015   Procedure: TOTAL HIP ARTHROPLASTY;  Surgeon: Dereck Leep, MD;  Location: [facility] ORS;  Service: Orthopedics;  Laterality: Left;  . TOTAL HIP REVISION Left 03/16/2019   Procedure: TOTAL HIP REVISION ARTHROPLASTY WITH CONVERSION TO A CONSTRAINED LINER.;  Surgeon: Dereck Leep, MD;  Location: [facility] ORS;  Service: Orthopedics;  Laterality: Left;  . WISDOM TOOTH EXTRACTION      (Not in a hospital admission)  Social History   Socioeconomic History  . Marital status: Divorced    Spouse name: Not on file  . Number of children: Not on file  . Years of education: Not on file  . Highest education level: Not on file  Occupational History  . Not on file  Social Needs  . Financial resource strain: Not on file  . Food insecurity    Worry: Not on file    Inability: Not on file  .  Transportation needs    Medical: Not on file    Non-medical: Not on file  Tobacco Use  . Smoking status: Current Every Day Smoker    Packs/day: 0.25    Types: Cigarettes  . Smokeless tobacco: Never Used  Substance and Sexual Activity  . Alcohol use: Yes    Comment: 2 40oz beers daily  . Drug use: No    Comment: Patient denies   . Sexual activity: Yes  Lifestyle  . Physical activity    Days per week: Not on file    Minutes per session: Not on file  .  Stress: Not on file  Relationships  . Social Herbalist on phone: Not on file    Gets together: Not on file    Attends religious service: Not on file    Active member of club or organization: Not on file    Attends meetings of clubs or organizations: Not on file    Relationship status: Not on file  . Intimate partner violence    Fear of current or ex partner: Not on file    Emotionally abused: Not on file    Physically abused: Not on file    Forced sexual activity: Not on file  Other Topics Concern  . Not on file  Social History Narrative   ** Merged History Encounter **        Family History  Problem Relation Age of Onset  . COPD Mother       Review of systems complete and found to be negative unless listed above      PHYSICAL EXAM  General: Well developed, well nourished, in no acute distress HEENT:  Normocephalic and atramatic Neck:  No JVD.  Lungs: Clear bilaterally to auscultation and percussion. Heart: HRRR . Normal S1 and S2 without gallops or murmurs.  Abdomen: Bowel sounds are positive, abdomen soft and non-tender  Msk:  Back normal, normal gait. Normal strength and tone for age. Extremities: No clubbing, cyanosis or edema.   Neuro: Alert and oriented X 3. Psych:  Good affect, responds appropriately  Labs:   Lab Results  Component Value Date   WBC 16.0 (H) 06/18/2019   HGB 11.6 (L) 06/11/2019   HCT 36.4 06/02/2019   MCV 91.2 05/30/2019   PLT 381 06/08/2019    Recent Labs  Lab 06/01/2019 1228  NA 134*  K 5.6*  CL 97*  CO2 15*  BUN 72*  CREATININE 2.09*  CALCIUM 8.9  PROT 6.7  BILITOT 2.9*  ALKPHOS 92  ALT 12  AST 227*  GLUCOSE 56*   Lab Results  Component Value Date   TROPONINI <0.03 04/07/2018    Lab Results  Component Value Date   CHOL 225 (H) 05/12/2017   CHOL 215 (H) 07/05/2016   CHOL 195 07/05/2015   Lab Results  Component Value Date   HDL 58 05/12/2017   HDL 53 07/05/2016   HDL 50 07/05/2015   Lab Results   Component Value Date   LDLCALC 130 (H) 05/12/2017   LDLCALC 136 (H) 07/05/2016   LDLCALC 116 (H) 07/05/2015   Lab Results  Component Value Date   TRIG 185 (H) 05/12/2017   TRIG 131 07/05/2016   TRIG 143 07/05/2015   Lab Results  Component Value Date   CHOLHDL 3.9 05/12/2017   CHOLHDL 4.1 07/05/2016   CHOLHDL 3.9 07/05/2015   No results found for: LDLDIRECT    Radiology: Ct Head Wo Contrast  Result Date: 06/22/2019  CLINICAL DATA:  Altered level of consciousness. Confusion. Parkinson's. EXAM: CT HEAD WITHOUT CONTRAST TECHNIQUE: Contiguous axial images were obtained from the base of the skull through the vertex without intravenous contrast. COMPARISON:  April 13, 2019 FINDINGS: Brain: No subdural, epidural, or subarachnoid hemorrhage identified. Cerebellum, brainstem, and basal cisterns are normal. No mass effect or midline shift. No acute cortical ischemia or infarct identified. Vascular: No hyperdense vessel or unexpected calcification. Skull: Normal. Negative for fracture or focal lesion. Sinuses/Orbits: No acute finding. Other: None. IMPRESSION: 1. No acute intracranial abnormalities are identified. 2. Electronically Signed   By: Dorise Bullion III M.D   On: 06/20/2019 14:29   Dg Chest Portable 1 View  Result Date: 06/12/2019 CLINICAL DATA:  Altered mental status. EXAM: PORTABLE CHEST 1 VIEW COMPARISON:  April 17, 2019. FINDINGS: The heart size and mediastinal contours are within normal limits. Both lungs are clear. The visualized skeletal structures are unremarkable. IMPRESSION: No active disease. Electronically Signed   By: Marijo Conception M.D.   On: 06/18/2019 14:02    EKG: Atrial fibrillation at a rate of 57 bpm  ASSESSMENT AND PLAN:   1.  Paroxysmal atrial fibrillation, at 57 bpm, of uncertain clinical significance, patient hemodynamically stable, with acute renal failure, and mild hyperkalemia. Patient has chads vas score of 2, but has history of Parkinson's, EtOH abuse,  and poor medical compliance, poor candidate for chronic anticoagulation. 2.  Acute renal failure, likely secondary to dehydration 3.  Leukocytosis, unknown etiology, with history of recurrent UTI 4.  Altered mental status, likely multifactorial, with history of Parkinson's, EtOH abuse, poor medical compliance, with acute renal failure/dehydration  Recommendations  1.  Agree with overall current therapy 2.  IV hydration 3.  No indication for temporary pacemaker at this time 4.  Defer anticoagulation at this time 5.  Hold all AV nodal blocking agents 6.  Review 2D echocardiogram 7.  Further recommendations pending patient's initial clinical course  Signed: Isaias Cowman MD,PhD, [name] 06/20/2019, 5:12 PM

## 2019-05-31 NOTE — Consult Note (Signed)
PULMONARY / CRITICAL CARE MEDICINE   Name: [name] MRN: [id number] DOB: 25-Jul-1955    ADMISSION DATE:  06/03/2019 CONSULTATION DATE:  06/09/2019  REFERRING MD:  Rufina Falco, NP  CHIEF COMPLAINT:  Altered mental status  BRIEF DISCUSSION: 63 y.o. Female with PMH of ETOH abuse and Parkinson's presented 06/14/2019 with Altered Mental Status.  Found to have AKI, Hypoglycemia, and Leukocytosis.  She decompensated (hypotensive, hypoxic, SOB, worsening mental status) in ED requiring intubation and vasopressors.  Noted to have frank blood in OG tube.  Requiring transcutaneous pacing with plans for transvenous pacer. Concern for Cardiogenic vs. Septic vs. Hemorrhagic shock.  HISTORY OF PRESENT ILLNESS:   [name] is a 63 y.o. Female with a past medical history notable for recent COVID-19 infection in October 2020, ETOH abuse, UTI's, Parkinson's, and neuropathy who presents to [facility] ED on 06/15/2019 due to confusion.  Pt is currently intubated and no family currently present, therefore history is obtained from ED and nursing notes.  Per notes, she was brought in from home as she was incoherent and altered.  She denied fever, chills, chest pain, shortness of breath, dysuria, or hematuria. She also denied recent alcohol consumption.  Initial workup in the ED revealed AKI, Hyperkalemia, Hypoglycemia, and Leukocytosis.  CXR was negative for infiltrates, urinalysis without infection, and CT Head negative for any acute process.  Salicylate and acetaminophen levels were negative.  EKG with atrial fibrillation and bradycardia, of which Cardiology was consulted and evaluated her in the ED.  While awaiting bed placement, she acutely decompensated in the ED, exhibiting hypotension, shortness of breath, hypoxia, and worsening of altered mental status.  She was subsequently intubated in the ED.  Bedside echocardiogram in the ED showed normal RV size and no pericardial effusion, aorta normal and without evidence of  aneurysm.  Her OG tube was noted to be returning frank blood.  She was given 2 units of uncrossed blood and started on Levophed.  CT Abdomen is currently pending. She is being admitted to ICU by the hospitalist for further workup and treatment of Acute Hypoxic Respiratory Failure requiring intubation, hypotension requiring vasopressors, AKI, and bradycardia.  PCCM is consulted for further management.  Upon arrival to ICU, pt appears very cyanotic and mottled.  Neuro exam reveals fixed and dilated pupils, and lack of corneal and cough/gag reflexes.  Pt also noted to be bradycardic with HR 30-40.  She was placed on transcutaneous pacing with improvement in HR and decrease in vasopressor requirements.  CT Abdomen/Pelvis reveals cholelithiasis with gallbladder sludge and Duodenitis (no perforation).  Lactic acid is 9.0 and Procalcitonin is 0.30.  Repeat CT Head is normal. Concern is for likely Cardiogenic shock vs.qustionable Septic shock.  PAST MEDICAL HISTORY :  She  has a past medical history of Anxiety, Anxiety, Arthritis, Asthma, Bipolar disorder ([place]), Cancer ([place]), Depression, ETOH abuse, Heart murmur, Insomnia, and Neuropathy.  PAST SURGICAL HISTORY: She  has a past surgical history that includes Cesarean section; Arm amputation at shoulder (Left); Abdominal hysterectomy; Cesarean section ([place]); Abdominal hysterectomy (2007); Arm amputation (Left, 1998); Joint replacement (Right, 2012); Total hip arthroplasty (Left, 09/17/2015); Lumbar laminectomy/decompression microdiscectomy (Left, 04/16/2016); Esophagogastroduodenoscopy (egd) with propofol (N/A, 04/27/2018); Colonoscopy; Wisdom tooth extraction; Total hip revision (Left, 03/16/2019); and Incision and drainage (Left, 04/13/2019).  Allergies  Allergen Reactions  . Benadryl [Diphenhydramine] Hives and Other (See Comments)    Reaction:  Hyperactivity   . Benadryl [Diphenhydramine] Other (See Comments)    My jaw locked and I could  not speak,  like a stroke.  . Cephalosporins Hives  . Codeine Nausea And Vomiting    No current facility-administered medications on file prior to encounter.    Current Outpatient Medications on File Prior to Encounter  Medication Sig  . acetaminophen (TYLENOL) 500 MG tablet Take 1,000 mg by mouth every 6 (six) hours as needed for mild pain or moderate pain.  Marland Kitchen amantadine (SYMMETREL) 100 MG capsule Take 200 mg by mouth 2 (two) times daily.  . benztropine (COGENTIN) 1 MG tablet Take 1 mg by mouth at bedtime.  . calcium carbonate (OSCAL) 1500 (600 Ca) MG TABS tablet Take 1,500 mg by mouth daily.  . carbidopa-levodopa (SINEMET IR) 25-100 MG tablet Take 2 tablets by mouth 3 (three) times daily.  . celecoxib (CELEBREX) 200 MG capsule Take 200 mg by mouth 2 (two) times daily.  Marland Kitchen gabapentin (NEURONTIN) 300 MG capsule Take 900 mg by mouth 3 (three) times daily.  Marland Kitchen LORazepam (ATIVAN) 0.5 MG tablet Take 0.5 mg by mouth daily as needed for anxiety.  . propranolol (INDERAL) 20 MG tablet Take 20-30 mg by mouth See admin instructions. Take 1 tablets ('30mg'$ ) by mouth every morning, 1 tablet ('20mg'$ ) by mouth at lunchtime and take 1 tablets ('30mg'$ ) by mouth every night  . traZODone (DESYREL) 150 MG tablet Take 300 mg by mouth at bedtime.  Marland Kitchen venlafaxine XR (EFFEXOR-XR) 150 MG 24 hr capsule Take 300 mg by mouth daily with breakfast.    FAMILY HISTORY:  Her She indicated that her mother is deceased. She indicated that her father is deceased.   SOCIAL HISTORY: She  reports that she has been smoking cigarettes. She has been smoking about 0.25 packs per day. She has never used smokeless tobacco. She reports current alcohol use. She reports that she does not use drugs.    COVID-19 DISASTER DECLARATION:  FULL CONTACT PHYSICAL EXAMINATION WAS NOT POSSIBLE DUE TO TREATMENT OF COVID-19 AND  CONSERVATION OF PERSONAL PROTECTIVE EQUIPMENT, LIMITED EXAM FINDINGS INCLUDE-  Patient assessed or the symptoms described in the  history of present illness.  In the context of the Global COVID-19 pandemic, which necessitated consideration that the patient might be at risk for infection with the SARS-CoV-2 virus that causes COVID-19, Institutional protocols and algorithms that pertain to the evaluation of patients at risk for COVID-19 are in a state of rapid change based on information released by regulatory bodies including the CDC and federal and state organizations. These policies and algorithms were followed during the patient's care while in hospital.   REVIEW OF SYSTEMS:   Unable to assess due to intubation and critical illness  SUBJECTIVE:  Unable to assess due to intubation and critical illness  VITAL SIGNS: BP 124/83   Pulse (!) 44   Temp (!) 96.9 F (36.1 C)   Resp (!) 22   Ht '5\' 5"'$  (1.651 m)   Wt 58.5 kg   SpO2 (!) 83%   BMI 21.47 kg/m   HEMODYNAMICS:    VENTILATOR SETTINGS: Vent Mode: AC FiO2 (%):  [60 %-100 %] 60 % Set Rate:  [16 bmp] 16 bmp Vt Set:  [400 mL] 400 mL PEEP:  [8 cmH20] 8 cmH20  INTAKE / OUTPUT: I/O last 3 completed shifts: In: 3 [I.V.:3] Out: -   PHYSICAL EXAMINATION: General: Critically ill appearing female, laying in bed, intubated, in no acute distress Neuro:  Unresponsive on no sedation, pupils fixed and dilated (6 mm bilaterally), no corneal reflex, no cough/gag reflex HEENT:  Atraumatic, normocephalic,  neck supple, no JVD Cardiovascular:  Irregular rhythm, bradycardia Lungs:  Coarse breath sounds throughout, vent assisted Abdomen:  Soft, nontender, nondistended, no guarding or rebound tenderness, Hypoactive bowel sounds Musculoskeletal:  LUE amputation Skin:  Cool, mottled, no obvious rashes, lesions, or ulcerations  LABS:  BMET Recent Labs  Lab 06/02/2019 1228  NA 134*  K 5.6*  CL 97*  CO2 15*  BUN 72*  CREATININE 2.09*  GLUCOSE 56*    Electrolytes Recent Labs  Lab 05/24/2019 1228  CALCIUM 8.9    CBC Recent Labs  Lab 06/11/2019 1228  WBC 16.0*   HGB 11.6*  HCT 36.4  PLT 381    Coag's No results for input(s): APTT, INR in the last 168 hours.  Sepsis Markers No results for input(s): LATICACIDVEN, PROCALCITON, O2SATVEN in the last 168 hours.  ABG Recent Labs  Lab 06/02/2019 2055  PHART 7.07*  PCO2ART 33  PO2ART 348*    Liver Enzymes Recent Labs  Lab 06/11/2019 1228  AST 227*  ALT 12  ALKPHOS 92  BILITOT 2.9*  ALBUMIN 4.1    Cardiac Enzymes No results for input(s): TROPONINI, PROBNP in the last 168 hours.  Glucose Recent Labs  Lab 06/04/2019 1605 05/25/2019 1815 06/15/2019 1857 05/30/2019 2011  GLUCAP 104* 40* 155* 79    Imaging Ct Head Wo Contrast  Result Date: 06/12/2019 CLINICAL DATA:  Altered level of consciousness. Confusion. Parkinson's. EXAM: CT HEAD WITHOUT CONTRAST TECHNIQUE: Contiguous axial images were obtained from the base of the skull through the vertex without intravenous contrast. COMPARISON:  April 13, 2019 FINDINGS: Brain: No subdural, epidural, or subarachnoid hemorrhage identified. Cerebellum, brainstem, and basal cisterns are normal. No mass effect or midline shift. No acute cortical ischemia or infarct identified. Vascular: No hyperdense vessel or unexpected calcification. Skull: Normal. Negative for fracture or focal lesion. Sinuses/Orbits: No acute finding. Other: None. IMPRESSION: 1. No acute intracranial abnormalities are identified. 2. Electronically Signed   By: Dorise Bullion III M.D   On: 05/24/2019 14:29   Dg Chest Portable 1 View  Result Date: 06/12/2019 CLINICAL DATA:  Respiratory distress. Endotracheal tube and OG tube placement. EXAM: PORTABLE CHEST 1 VIEW COMPARISON:  06/07/2019 FINDINGS: Endotracheal tube is noted with tip 2.8 cm above the carina. An OG tube is noted entering the stomach with tip just off of the field of view. This is a mildly low volume film. Cardiomegaly and mild pulmonary vascular congestion noted. No pneumothorax or pleural effusion. Defibrillator/pacing pads  overlying the chest noted. No acute bony abnormalities are identified. IMPRESSION: 1. Endotracheal tube with tip 2.8 cm above the carina. 2. OG tube entering the stomach with tip just off of the field of view. 3. Cardiomegaly with mild pulmonary vascular congestion. Electronically Signed   By: Margarette Canada M.D.   On: 06/14/2019 20:51   Dg Chest Portable 1 View  Result Date: 05/24/2019 CLINICAL DATA:  Altered mental status. EXAM: PORTABLE CHEST 1 VIEW COMPARISON:  April 17, 2019. FINDINGS: The heart size and mediastinal contours are within normal limits. Both lungs are clear. The visualized skeletal structures are unremarkable. IMPRESSION: No active disease. Electronically Signed   By: Marijo Conception M.D.   On: 06/11/2019 14:02   Dg Abd Portable 1 View  Result Date: 06/11/2019 CLINICAL DATA:  OG tube placement. EXAM: PORTABLE ABDOMEN - 1 VIEW COMPARISON:  None. FINDINGS: An OG tube is noted with tip overlying the mid stomach. Bowel gas pattern is unremarkable. IMPRESSION: 1. OG tube with tip overlying the mid  stomach. 2. Unremarkable bowel gas pattern. Electronically Signed   By: Margarette Canada M.D.   On: 06/02/2019 20:52     STUDIES:  12/8- CT Head>> negative for any acute process 12/8- CT Abdomen>> Small right pleural effusion with bibasilar atelectasis. Cholelithiasis and gallbladder sludge.Mild ascites. Inflammatory changes surrounding duodenum and head of the pancreas likely related to some duodenitis. No perforation or definitive ulcer crater is seen. These changes may be in part related to the underlying ascites as well. No other focal abnormality is noted. 12/9- CT Head>>Normal head CT. 12/9- EEG>> 12/9- Echocardiogram>>  CULTURES: Blood x2 12/8>> Urine 12/8>> Tracheal aspirate 12/8>> SARS-CoV-2 PCR 12/8>> NEGATIVE Influenza PCR 12/8>>NEGATIVE Respiratory Viral Panel 12/8>> NEGATIVE MRSA PCR 12/8>> NEGATIVE  ANTIBIOTICS: Zosyn 12/8>>  SIGNIFICANT EVENTS: 12/8- Presented  to ED 12/8- Decompensated in ED requiring intubation and vasopressors 12/9- Transvenous pacer placed  LINES/TUBES: ETT 12/8>> Right IJ CVC 12/8>> Right femoral Transvenous pacer 12/9>>  ASSESSMENT / PLAN:  PULMONARY A: Acute Hypoxic Respiratory Failure, likely secondary to severe metabolic acidosis and pulmonary vascular congestion Hx: Asthma P:   -Full vent support -Follow intermittent CXR & ABG as needed -Wean FiO2 & PEEP as tolerated -Spontaneous breathing trials when respiratory parameters met -Prn Bronchodilators -Unable to diurese at this time due to hypotension and vasopressors  CARDIOVASCULAR A:  Hypotension, likely Cardiogenic shock vs. ? Septic shock vs. ? Hemorrhagic shock Bradycardia P:  -Continuous cardiac monitoring -Maintain MAP >65 -IV fluids -Levophed as needed to maintain MAP goal -Check CVP -Cardiology following, appreciate input -Placed on Transcutaneous pacing, have notified Cardiology and inferred about Transvenous pacer -Trend troponin -Check BNP -Echocardiogram pending  RENAL A:   AKI Anion gap Metabolic acidosis Rhabdomyolysis Hyperkalemia>>resolved Hyponatremia>>resolved P:   -Monitor I&O's / urinary output -Follow BMP -Ensure adequate renal perfusion -Avoid nephrotoxic agents as able -Replace electrolytes as indicated -Nephrology consulted, appreciate input -IV fluids -Follow serum CK -Renal US pending -Bicarb gtt   GASTROINTESTINAL A:   GI Bleed Cholelithiasis w/ Gallbladder sludge CT Abdomen & Pelvis w/ Duodenitis Elevated LFT's, ? Shock liver Hyperammonemia P:   -NPO -Protonix gtt -GI consulted, appreciate input -Transfuse as indicated -Consider General Surgery consult -Trend LFT's & Ammonia -Check Hepatitis panel  HEMATOLOGIC A:   Acute blood loss anemia Supratherapeutic INR, likely in setting of liver failure vs. ? DIC P:  -Monitor for S/Sx of bleeding -Trend CBC -SCD's for VTE Prophylaxis (no chemical  prophylaxis given GI bleed) -Transfuse for Hgb <8 -Trend bleeding times -Check DIC panel -Consider FFP & Vitamin K   INFECTIOUS A:   Leukocytosis Meets SIRS Criteria, CT Abdomen with Duodenitis P:   -Monitor fever curve -Trend WBC's & Procalcitonin -Will place on empiric Zosyn -Follow pan cultures as above -CXR without infiltrate -CT Abdomen with Cholelithiasis and gallbladder slude, and with Duodenitis (no perforation noted)   ENDOCRINE A:   Hypoglycemia   P:   -CBG's -Follow ICU Hypo/hyperglycemia protocol -D5 infusion  NEUROLOGIC A:   Altered mental status, now unresponsive without corneal & gag/cough reflexes; concern for anoxic injury Hx: Parkinson's, Bipolar disorder P:   -RASS goal: 0 to -1 -Fentanyl pushes as needed to maintain RASS goal -Daily wake up assessments -CT head negative 12/8 -Will repeat CT Head due to poor Neuro exam upon arrival to ICU>> repeat CT Head normal -Obtain EEG -Consult Neurology, appreciate input -Urine drug screen positive for benzodiazepines       FAMILY  - Updates: No family at bedside during NP rounds.  Pt's prognosis  is very poor.  High risk for cardiac arrest and death.  - Inter-disciplinary family meet or Palliative Care meeting due by:  06/07/2019    [facility]ACT[facility] DISPOSITION: ICU GOALS OF CARE: FULL CODE VTE PROPHYLAXIS: SCD'S (NO CHEMICAL PROPHYLAXIS GIVEN GI BLEED) GI PROPHYLAXIS: PROTONIX      Darel Hong, AGACNP-BC [place] [facility] Pager: [phone]  06/23/2019, 9:44 PM

## 2019-05-31 NOTE — Procedures (Signed)
Central Venous Catheter Insertion Procedure Note [name] [id number] 02-11-1956  Procedure: Insertion of Central Venous Catheter Indications: Assessment of intravascular volume, Drug and/or fluid administration and Frequent blood sampling  Procedure Details Consent: Unable to obtain consent because of emergent medical necessity. Time Out: Verified patient identification, verified procedure, site/side was marked, verified correct patient position, special equipment/implants available, medications/allergies/relevent history reviewed, required imaging and test results available.  Performed  Maximum sterile technique was used including antiseptics, cap, gloves, gown, hand hygiene, mask and sheet. Skin prep: Chlorhexidine; local anesthetic administered A antimicrobial bonded/coated triple lumen catheter was placed in the right internal jugular vein using the Seldinger technique.  Evaluation Blood flow good Complications: No apparent complications Patient did tolerate procedure well. Chest X-ray ordered to verify placement.  CXR: pending.   Procedure was performed using Ultrasound for direct visualization of cannulization of right IJ.  Line was secured at 16 cm mark.    Darel Hong, AGACNP-BC [facility] Pager: [phone]  Bradly Bienenstock 06/03/2019, 11:47 PM

## 2019-05-31 NOTE — ED Notes (Signed)
Pt anxious on and off and was saying she had to get out of bed because it was making her anxious. Pt alert and oriented to self and knows where is is but when speaking she will talk about random things in complete sentences but will not be in reference to what is going on at that time. Pt seems confused and anxious

## 2019-05-31 NOTE — ED Notes (Signed)
fsbs 40  md aware.

## 2019-05-31 NOTE — ED Provider Notes (Signed)
-----------------------------------------   9:14 PM on 06/22/2019 -----------------------------------------  Notified by nursing that patient appeared to be acutely decompensating.  She was noted to have a low blood pressure and was given an IV fluid bolus with improvement, but was complaining of increasing difficulty breathing and seemed increasingly altered.  It was very difficult to obtain an accurate O2 sat, but her extremities appeared mottled.  She remains in sinus bradycardia, but I am concerned this is secondary to another process.  She does seem to have more discomfort with palpation of her abdomen diffusely.  Decision was made to intubate given worsening mental status and hypoxia.  Subsequent chest x-ray shows appropriate positioning of endotracheal tube with no change in lungs.  The etiology of her decompensation is not obvious. Bedside echocardiogram showed normal RV size with no pericardial effusion and adequate squeeze.  Aorta appears within normal limits with no evidence of aneurysm, no free fluid noted on bedside FAST exam.  ABG shows patient to be hyperoxic and I suspect her low O2 sat readings are secondary to poor perfusion.  She is now becoming increasingly hypotensive and OG tube is returning frank blood.  We will transfuse 2 units of uncrossed blood and start on Levophed with goal MAP greater than 65.  Hospitalist team has discussed case with ICU and patient to be transferred there.  She will be started on antibiotics per hospitalist team, plan to obtain CT abdomen/pelvis on the way to the ICU.  PROCEDURES  Procedure(s) performed (including Critical Care):  Procedure Name: Intubation Date/Time: 06/09/2019 9:00 PM Performed by: Blake Divine, MD Pre-anesthesia Checklist: Patient identified, Patient being monitored, Emergency Drugs available, Timeout performed and Suction available Oxygen Delivery Method: Non-rebreather mask Preoxygenation: Pre-oxygenation with 100%  oxygen Induction Type: Rapid sequence Ventilation: Mask ventilation without difficulty Laryngoscope Size: Mac and 4 Grade View: Grade I Tube size: 7.5 mm Number of attempts: 1 Airway Equipment and Method: Video-laryngoscopy Placement Confirmation: ETT inserted through vocal cords under direct vision,  CO2 detector and Breath sounds checked- equal and bilateral Secured at: 22 cm Tube secured with: ETT holder Dental Injury: Teeth and Oropharynx as per pre-operative assessment           Blake Divine, MD 06/17/2019 2123

## 2019-05-31 NOTE — ED Notes (Signed)
Cards and attending notified of pt's dec BP 78/56; MAP 64; HR all over the place ranging from 30-50 but never sustaining at any particular rate.

## 2019-05-31 NOTE — Progress Notes (Signed)
Pharmacy Antibiotic Note  [name] is a 63 y.o. female admitted on 06/01/2019 with sepsis.  Pharmacy has been consulted for Zosyn dosing.  Plan: Zosyn 3.375g IV q8h (4 hour infusion).  Height: 5\' 5"  (165.1 cm) Weight: 129 lb (58.5 kg) IBW/kg (Calculated) : 57  Temp (24hrs), Avg:97.5 F (36.4 C), Min:97.2 F (36.2 C), Max:97.9 F (36.6 C)  Recent Labs  Lab 05/27/2019 1228  WBC 16.0*  CREATININE 2.09*    Estimated Creatinine Clearance: 24.1 mL/min (A) (by C-G formula based on SCr of 2.09 mg/dL (H)).    Allergies  Allergen Reactions  . Benadryl [Diphenhydramine] Hives and Other (See Comments)    Reaction:  Hyperactivity   . Benadryl [Diphenhydramine] Other (See Comments)    My jaw locked and I could not speak, like a stroke.  . Cephalosporins Hives  . Codeine Nausea And Vomiting    Antimicrobials this admission:   >>    >>   Dose adjustments this admission:   Microbiology results:  BCx:   UCx:    Sputum:    MRSA PCR:   Thank you for allowing pharmacy to be a part of this patient's care.  Aniken Monestime D 06/15/2019 9:24 PM

## 2019-05-31 NOTE — ED Notes (Signed)
Amy, RN notified attending pt's BG 40. D50 given by this RN. Will check BG again in 15 mins.

## 2019-05-31 NOTE — ED Notes (Signed)
Dr Saralyn Pilar in with pt.

## 2019-05-31 NOTE — Progress Notes (Signed)
Transported pt to CT then to ICU on vent without incident. Pt placed on ICU vent and is tol well at this time. Report given to ICU RT.

## 2019-05-31 NOTE — Progress Notes (Signed)
[facility] Progress Note Patient Name: [name] DOB: 1956-03-22 MRN: [id number]   Date of Service  06/17/2019  HPI/Events of Note  Pt admitted to the ICU for acute respiratory failure requiring intubation, altered mental status, GI bleeding and hypotension. She has a history of ETOH abuse.  eICU Interventions  New patient evaluation completed.        Kerry Kass Maryl Blalock 06/19/2019, 10:51 PM

## 2019-05-31 NOTE — ED Notes (Signed)
Xray at bedside to attempt post intubation xray.

## 2019-05-31 NOTE — ED Notes (Addendum)
Pt decompensating.  Pt anxious, states can't breathe, moaning and grunting.  Pt appears dusky and cold.  Prior EDP informed, Dr. Charna Archer at bedside and Dr. Stark Klein.  Plan to intubate patient.  Pt placed on zoll pads.  Lorriane Shire, RN attempted for another IV line.

## 2019-05-31 NOTE — H&P (Signed)
History and Physical  [name] [id number] DOB: 25-Jan-1956 DOA: 05/26/2019  Referring physician: ED PCP: Dion Body, MD  Outpatient Specialists:   Chief Complaint: Confusion and altered level of consciousness  HPI: [name] is a 63 y.o. female with past medical history of recent Covid infection in October 2020, neuropathy, Parkinson's syndrome, UTI and history of recurrent falls presents to the emergency department via EMS with confusion.  Per EMS patient called the EMS and in the emergency her ER physician and record patient was somewhat incoherent and inconsistent with her response.  Patient was recently here at this emergency and was sent home from emergency with home health care arrangement.  As per record the patient is reportedly more confused than usual although it is important to note that her mental status changes from lucid albeit belligerent to somewhat disjointed thinking.  Patient currently denies any obvious chest pain or shortness of breath.  ED course: Patient was found to have acute kidney injury creatinine above 2, hypoglycemic with blood glucose in 50s and hyperkalemic with leukocytosis.  And received dextrose IV bolus therapy.  Received calcium gluconate.  CT of the head without contrast shows no acute findings that would explain her current status.  Hospitalist service was called for further evaluation and management.  Review of Systems: All systems reviewed and apart from history of presenting illness, are negative.  Past Medical History:  Diagnosis Date  . Anxiety   . Anxiety    panic anxiety disorder  . Arthritis   . Asthma    chronic asthmatic bronchitis  . Bipolar disorder ([place])   . Cancer [facility])    Desmoid tumor left forearm  . Depression   . ETOH abuse   . Heart murmur   . Insomnia   . Neuropathy    Past Surgical History:  Procedure Laterality Date  . ABDOMINAL HYSTERECTOMY    . ABDOMINAL HYSTERECTOMY  2007  . 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 ARM AMPUTATION AT SHOULDER Left   . CESAREAN SECTION    . [place]   x 2  . COLONOSCOPY    . ESOPHAGOGASTRODUODENOSCOPY (EGD) WITH PROPOFOL N/A 04/27/2018   Procedure: ESOPHAGOGASTRODUODENOSCOPY (EGD) WITH PROPOFOL;  Surgeon: Toledo, Benay Pike, MD;  Location: [facility] ENDOSCOPY;  Service: Gastroenterology;  Laterality: N/A;  . INCISION AND DRAINAGE Left 04/13/2019   Procedure: INCISION AND DRAINAGE;  Surgeon: Dereck Leep, MD;  Location: [facility] ORS;  Service: Orthopedics;  Laterality: Left;  . JOINT REPLACEMENT Right 2012   hip  . LUMBAR LAMINECTOMY/DECOMPRESSION MICRODISCECTOMY Left 04/16/2016   Procedure: LUMBAR LAMINECTOMY/DECOMPRESSION MICRODISCECTOMY 1 LEVEL;  Surgeon: Meade Maw, MD;  Location: [facility] ORS;  Service: Neurosurgery;  Laterality: Left;  Left L4-5 far lateral discectomy, left L4-5 laminoforaminotomy  . TOTAL HIP ARTHROPLASTY Left 09/17/2015   Procedure: TOTAL HIP ARTHROPLASTY;  Surgeon: Dereck Leep, MD;  Location: [facility] ORS;  Service: Orthopedics;  Laterality: Left;  . TOTAL HIP REVISION Left 03/16/2019   Procedure: TOTAL HIP REVISION ARTHROPLASTY WITH CONVERSION TO A CONSTRAINED LINER.;  Surgeon: Dereck Leep, MD;  Location: [facility] ORS;  Service: Orthopedics;  Laterality: Left;  . WISDOM TOOTH EXTRACTION     Social History:  reports that she has been smoking cigarettes. She has been smoking about 0.25 packs per day. She has never used smokeless tobacco. She reports current alcohol use. She reports that she does not use drugs.   Allergies  Allergen Reactions  . Benadryl [Diphenhydramine] Hives and Other (See Comments)    Reaction:  Hyperactivity   . Benadryl [Diphenhydramine] Other (See Comments)    My jaw locked and I could not speak, like a stroke.  . Cephalosporins Hives  . Codeine Nausea And Vomiting    Family History  Problem Relation Age of Onset  . COPD Mother       Prior to Admission  medications   Medication Sig Start Date End Date Taking? Authorizing Provider  chlordiazePOXIDE (LIBRIUM) 25 MG capsule Take 25 mg by mouth 3 (three) times daily.    [provider]  propranolol (INDERAL) 20 MG tablet Take 20-30 mg by mouth See admin instructions. Take 1 tablets (30mg ) by mouth every morning, 1 tablet (20mg ) by mouth at lunchtime and take 1 tablets (30mg ) by mouth at bedtime    [provider]  venlafaxine XR (EFFEXOR-XR) 150 MG 24 hr capsule Take 2 capsules (300 mg total) by mouth daily with breakfast. 11/29/18   Fritzi Mandes, MD   Physical Exam: Vitals:   06/08/2019 1446 05/24/2019 1447 06/19/2019 1500 06/19/2019 1609  BP: 134/73  130/68 113/60  Pulse:  (!) 55 63   Resp:  (!) 24 (!) 21 (!) 24  Temp:      TempSrc:      SpO2:  100% 96%   Weight:      Height:        Constitutional: Somewhat confused.  Appears to be disheveled. HENT:  Head: Normocephalic and atraumatic.  Mouth/Throat: Oropharynx is clear and moist.  Eyes: Pupils are equal, round, and reactive to light. Conjunctivae and EOM are normal. No scleral icterus.  Neck: Normal range of motion. Neck supple. No JVD present. No tracheal deviation present. No thyromegaly present.  Cardiovascular: Normal rate, regular rhythm and normal heart sounds.  No murmur or gallop murmur or gallop or rub appreciated. Respiratory: Effort normal and breath sounds normal.  GI: Soft. Bowel sounds are normal. She exhibits no distension. There is no abdominal tenderness.  Musculoskeletal: Normal range of motion.        General: No edema.  Lymphadenopathy:    She has no cervical adenopathy.  Neurological: Alert.  But incoherent she tries to answer confusing answer and tries to follow the command. She exhibits normal muscle tone lower extremities bilaterally have less strength than upper extremity.  Sensation grossly intact.  Extensor also intact Skin: Skin is warm and dry. No rash noted. No erythema.  Psychiatric: Confused    Labs on Admission:  Basic Metabolic Panel: Recent Labs  Lab 06/13/2019 1228  NA 134*  K 5.6*  CL 97*  CO2 15*  GLUCOSE 56*  BUN 72*  CREATININE 2.09*  CALCIUM 8.9   Liver Function Tests: Recent Labs  Lab 06/08/2019 1228  AST 227*  ALT 12  ALKPHOS 92  BILITOT 2.9*  PROT 6.7  ALBUMIN 4.1   No results for input(s): LIPASE, AMYLASE in the last 168 hours. No results for input(s): AMMONIA in the last 168 hours. CBC: Recent Labs  Lab 06/07/2019 1228  WBC 16.0*  NEUTROABS 12.4*  HGB 11.6*  HCT 36.4  MCV 91.2  PLT 381   Cardiac Enzymes: No results for input(s): CKTOTAL, CKMB, CKMBINDEX, TROPONINI in the last 168 hours.  BNP (last 3 results) No results for input(s): PROBNP in the last 8760 hours. CBG: Recent Labs  Lab 06/02/2019 1605  GLUCAP 104*    Radiological Exams on Admission: Ct Head Wo Contrast  Result  Date: 05/26/2019 CLINICAL DATA:  Altered level of consciousness. Confusion. Parkinson's. EXAM: CT HEAD WITHOUT CONTRAST TECHNIQUE: Contiguous axial images were obtained from the base of the skull through the vertex without intravenous contrast. COMPARISON:  April 13, 2019 FINDINGS: Brain: No subdural, epidural, or subarachnoid hemorrhage identified. Cerebellum, brainstem, and basal cisterns are normal. No mass effect or midline shift. No acute cortical ischemia or infarct identified. Vascular: No hyperdense vessel or unexpected calcification. Skull: Normal. Negative for fracture or focal lesion. Sinuses/Orbits: No acute finding. Other: None. IMPRESSION: 1. No acute intracranial abnormalities are identified. 2. Electronically Signed   By: Dorise Bullion III M.D   On: 06/14/2019 14:29   Dg Chest Portable 1 View  Result Date: 06/14/2019 CLINICAL DATA:  Altered mental status. EXAM: PORTABLE CHEST 1 VIEW COMPARISON:  April 17, 2019. FINDINGS: The heart size and mediastinal contours are within normal limits. Both lungs are clear. The visualized skeletal structures are  unremarkable. IMPRESSION: No active disease. Electronically Signed   By: Marijo Conception M.D.   On: 06/02/2019 14:02    EKG: Independently reviewed.  Rate of 55  Assessment/Plan: Admit to telemetry Active Problems:   Altered level of consciousness  Altered level of consciousness:  Multiple etiology such as elevated BUN versus infection versus dehydration versus seizure or intoxication -CT head in the ER is showing no acute intracranial process and chest x-ray showing no acute cardiac pulmonary process -BUN is in 70s and creatinine is up as well -AST 227 total bilirubin of 2.9, white count in 16 with neutrophil predominance Salicylate and serum acetaminophen levels are negative Blood glucose was found to be 56 and received dextrose in the ER -Repeat blood glucose every 4 hours -Check ammonia level, UA and urine culture blood culture -IV hydration -Check troponin -D echo -EKG looks irregular rhythm which was not present in October of this year his EKG. she is also bradycardic down to 40s.  Questionable symptomatic bradycardia.  Even though her blood pressure is in normal range. -May consult cardiology with arrhythmia that is new and possible anticoagulation option  -Hold any narcotics or CNS suppressant drugs for the time being -N.p.o. and speech evaluation  AKI: To be in acute kidney injury getting of 2.9 and BUN of 72 his GFR is 24 and there on his last visit on 05/20/2019 -Likely from dehydration -We will give IV hydration -Check renal panel - Renal US; urine electrolytes  -Awaiting UA - May consult Renal   Electrolyte imbalance: Patient's potassium found to be 5.6 with sodium of 134 -Could also be due to dehydration -Patient is to receive insulin with dextrose treatment for hyperkalemia -Start D5 NS IV hydration -Recheck and replace  Elevated LFTs: T is 227 while ALT is normal and his T bili is 2.9 -Unclear etiology at this point -Blood alcohol level was negative and  Tylenol level was also negative -May check hep panel although it is not consistent with the current left finding -May repeat LFTs after hydration -If symptomatic may obtain an ultrasound  Leukocytosis: Found to be in 16,000 which was not present when she was here last time -Neutrophil predominance of 12.4 -Could also be due to dehydration -We will hydrate patients and recheck CBC -Meanwhile we will check patient's UA urine culture and blood culture -However will hold from giving any antibiotic -No systemic signs or symptoms -COVID-19 test pending sent from ER -Tested negative on 10/21, then subsequently tested positive on 10/25 -Chest x-ray shows no acute cardiopulmonary process  Parkinson's  disease- stable -Was on Sinemet and amantadine at home - Hold for now  Mood disorder- stable -To hold trazodone or Cogentin since patient is an altered level of consciousness   DVT Prophylaxis: Brain Code Status: Full code Family Communication: Called patient's daughter's number that is listed in EMR and left messages as nobody answered with a return call back number of hospital. Disposition Plan: Based on PT OT evaluation  Time spent: Over 79 minutes  Thornell Mule, MD, FACP, [facility]. [facility] Pager [phone]  If 7PM-7AM, please contact night-coverage [email or web address] Password [email or web address] 06/04/2019, 4:20 PM

## 2019-05-31 NOTE — ED Notes (Signed)
Spoke with Dr. Kerman Passey regarding patient, see orders.

## 2019-05-31 NOTE — ED Provider Notes (Signed)
[facility] Emergency Department Provider Note   ____________________________________________   First MD Initiated Contact with Patient 06/17/2019 1345     (approximate)  I have reviewed the triage vital signs and the nursing notes.   HISTORY  Chief Complaint Altered Mental Status    HPI [name] is a 63 y.o. female with past medical history of Parkinson disease, anxiety, and alcohol abuse presents to the ED for altered mental status.  History is limited secondary to patient's change in mental status.  She is reportedly ANO x4 at baseline per chart, but called EMS earlier today for unclear reasons and was found to be confused.  Patient states "I am out of my medicines" but then quickly starts talking about being cold and is not redirectable.  She states both legs have been hurting her, but she denies any other complaints.  She states she has not had any fevers, chills, chest pain, shortness of breath, dysuria, or hematuria.  She reportedly has a history of alcohol abuse, but she denies recent alcohol consumption and when asked when she stops, will state only "when you told me".  She reports feeling weak in both legs, but denies any weakness in her arms or numbness.  She has not had any back pain and denies any difficulty urinating.  She was recently seen in the ED due to difficulty caring for herself, subsequently discharged home with home health at her request.        Past Medical History:  Diagnosis Date  . Anxiety   . Anxiety    panic anxiety disorder  . Arthritis   . Asthma    chronic asthmatic bronchitis  . Bipolar disorder ([place])   . Cancer [facility])    Desmoid tumor left forearm  . Depression   . ETOH abuse   . Heart murmur   . Insomnia   . Neuropathy     Patient Active Problem List   Diagnosis Date Noted  . Altered level of consciousness 05/24/2019  . AKI (acute kidney injury) ([place]) 04/13/2019  . Altered mental status   . ARF (acute renal  failure) ([place]) 03/31/2019  . Back pain 03/16/2019  . Constipation 03/16/2019  . Parkinson disease ([place]) 03/16/2019  . Murmur 03/16/2019  . Neck pain 03/16/2019  . 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 Essential hypertension 02/22/2019  . At high risk for falls 01/25/2019  . Drug-induced tremor 01/25/2019  . Essential tremor 01/25/2019  . 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 Displacement of internal left hip prosthesis ([place]) 11/29/2018  . Hip dislocation, left ([place]) 11/29/2018  . Hip dislocation, left, sequela 08/06/2018  . High anion gap metabolic acidosis 99991111  . History of alcohol abuse 10/09/2016  . Neuropathy of left upper extremity 10/09/2016  . Urinary incontinence, mixed 10/09/2016  . Overdose of antidepressant 07/03/2016  . 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 Alcohol use disorder, severe, dependence ([place]) 01/26/2015  . Tobacco use disorder 01/26/2015  . Nicotine dependence, uncomplicated [id number]  . Alcohol withdrawal ([place]) 01/08/2015  . 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 Suicidal ideation 12/19/2014  . Alcohol use 10/30/2014  . Anxiety 10/30/2014  . Depression 10/30/2014  . GAD (generalized anxiety disorder) 12/24/2013  . PTSD (post-traumatic stress disorder) 12/22/2013    Past Surgical History:  Procedure Laterality Date  . ABDOMINAL HYSTERECTOMY    . ABDOMINAL HYSTERECTOMY  2007  .  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 ARM AMPUTATION AT SHOULDER Left   . CESAREAN SECTION    . [place]   x 2  . COLONOSCOPY    . ESOPHAGOGASTRODUODENOSCOPY (EGD) WITH PROPOFOL N/A 04/27/2018   Procedure: ESOPHAGOGASTRODUODENOSCOPY (EGD) WITH PROPOFOL;  Surgeon: Toledo, Benay Pike, MD;  Location: [facility] ENDOSCOPY;  Service: Gastroenterology;  Laterality: N/A;  . INCISION AND DRAINAGE Left 04/13/2019   Procedure:  INCISION AND DRAINAGE;  Surgeon: Dereck Leep, MD;  Location: [facility] ORS;  Service: Orthopedics;  Laterality: Left;  . JOINT REPLACEMENT Right 2012   hip  . LUMBAR LAMINECTOMY/DECOMPRESSION MICRODISCECTOMY Left 04/16/2016   Procedure: LUMBAR LAMINECTOMY/DECOMPRESSION MICRODISCECTOMY 1 LEVEL;  Surgeon: Meade Maw, MD;  Location: [facility] ORS;  Service: Neurosurgery;  Laterality: Left;  Left L4-5 far lateral discectomy, left L4-5 laminoforaminotomy  . TOTAL HIP ARTHROPLASTY Left 09/17/2015   Procedure: TOTAL HIP ARTHROPLASTY;  Surgeon: Dereck Leep, MD;  Location: [facility] ORS;  Service: Orthopedics;  Laterality: Left;  . TOTAL HIP REVISION Left 03/16/2019   Procedure: TOTAL HIP REVISION ARTHROPLASTY WITH CONVERSION TO A CONSTRAINED LINER.;  Surgeon: Dereck Leep, MD;  Location: [facility] ORS;  Service: Orthopedics;  Laterality: Left;  . WISDOM TOOTH EXTRACTION      Prior to Admission medications   Not on File    Allergies Benadryl [diphenhydramine], Benadryl [diphenhydramine], Cephalosporins, and Codeine  Family History  Problem Relation Age of Onset  . COPD Mother     Social History Social History   Tobacco Use  . Smoking status: Current Every Day Smoker    Packs/day: 0.25    Types: Cigarettes  . Smokeless tobacco: Never Used  Substance Use Topics  . Alcohol use: Yes    Comment: 2 40oz beers daily  . Drug use: No    Comment: Patient denies     Review of Systems  Constitutional: No fever/chills Eyes: No visual changes. ENT: No sore throat. Cardiovascular: Denies chest pain. Respiratory: Denies shortness of breath. Gastrointestinal: No abdominal pain.  No nausea, no vomiting.  No diarrhea.  No constipation. Genitourinary: Negative for dysuria. Musculoskeletal: Negative for back pain.  Positive for bilateral leg pain. Skin: Negative for rash. Neurological: Negative for headaches, positive for bilateral lower extremity weakness, negative for numbness.   ____________________________________________   PHYSICAL EXAM:  VITAL SIGNS: ED Triage Vitals  Enc Vitals Group     BP 06/07/2019 1206 106/76     Pulse Rate 06/05/2019 1206 (!) 52     Resp 06/14/2019 1206 18     Temp 06/04/2019 1206 97.9 F (36.6 C)     Temp Source 06/11/2019 1206 Axillary     SpO2 06/21/2019 1206 100 %     Weight 06/16/2019 1207 129 lb (58.5 kg)     Height 06/22/2019 1207 5\' 5"  (1.651 m)     Head Circumference --      Peak Flow --      Pain Score 06/01/2019 1207 8     Pain Loc --      Pain Edu? --      Excl. in [place]? --     Constitutional: Alert and oriented to person, place, and time, but not situation. Eyes: Conjunctivae are normal. Head: Atraumatic. Nose: No congestion/rhinnorhea. Mouth/Throat: Mucous membranes are very dry. Neck: Normal ROM Cardiovascular: Normal rate, regular rhythm. Grossly normal heart sounds. Respiratory: Normal respiratory effort.  No retractions. Lungs CTAB. Gastrointestinal: Soft and nontender. No distention. Genitourinary:  deferred Musculoskeletal: No lower extremity tenderness nor edema.  2+ DP pulses bilaterally. Neurologic:  Normal speech and language.  Weakness noted to bilateral lower extremities with strength otherwise intact.  Diffuse tremors. Skin:  Skin is warm, dry and intact. No rash noted. Psychiatric: Mood and affect are normal. Speech and behavior are normal.  ____________________________________________   LABS (all labs ordered are listed, but only abnormal results are displayed)  Labs Reviewed  CBC WITH DIFFERENTIAL/PLATELET - Abnormal; Notable for the following components:      Result Value   WBC 16.0 (*)    Hemoglobin 11.6 (*)    nRBC 0.4 (*)    Neutro Abs 12.4 (*)    Monocytes Absolute 1.7 (*)    Abs Immature Granulocytes 0.61 (*)    All other components within normal limits  COMPREHENSIVE METABOLIC PANEL - Abnormal; Notable for the following components:   Sodium 134 (*)    Potassium 5.6 (*)    Chloride 97 (*)     CO2 15 (*)    Glucose, Bld 56 (*)    BUN 72 (*)    Creatinine, Ser 2.09 (*)    AST 227 (*)    Total Bilirubin 2.9 (*)    GFR calc non Af Amer 24 (*)    GFR calc Af Amer 28 (*)    Anion gap 22 (*)    All other components within normal limits  ACETAMINOPHEN LEVEL - Abnormal; Notable for the following components:   Acetaminophen (Tylenol), Serum <10 (*)    All other components within normal limits  SARS CORONAVIRUS 2 (TAT 6-24 HRS)  URINE CULTURE  CULTURE, BLOOD (ROUTINE X 2)  CULTURE, BLOOD (ROUTINE X 2)  ETHANOL  SALICYLATE LEVEL  URINALYSIS, COMPLETE (UACMP) WITH MICROSCOPIC  URINE DRUG SCREEN, QUALITATIVE ([facility] ONLY)  CK  CBC  CREATININE, SERUM  AMMONIA   ____________________________________________  EKG  ED ECG REPORT I, Blake Divine, the attending physician, personally viewed and interpreted this ECG.   Date: 06/17/2019  EKG Time: 14:55  Rate: 57  Rhythm: Bradycardic, irregular  Axis: Normal  Intervals:Prolonged QTc  ST&T Change: None   PROCEDURES  Procedure(s) performed (including Critical Care):  .Critical Care Performed by: Blake Divine, MD Authorized by: Blake Divine, MD   Critical care provider statement:    Critical care time (minutes):  45   Critical care time was exclusive of:  Separately billable procedures and treating other patients and teaching time   Critical care was necessary to treat or prevent imminent or life-threatening deterioration of the following conditions:  Renal failure   Critical care was time spent personally by me on the following activities:  Discussions with consultants, evaluation of patient's response to treatment, examination of patient, ordering and performing treatments and interventions, ordering and review of laboratory studies, ordering and review of radiographic studies, pulse oximetry, re-evaluation of patient's condition, obtaining history from patient or surrogate and review of old charts   I assumed  direction of critical care for this patient from another provider in my specialty: no       ____________________________________________   INITIAL IMPRESSION / ASSESSMENT AND PLAN / ED COURSE       63 year old female with history of Parkinson's disease, anxiety, and alcohol abuse presents to the ED after calling EMS for unclear reasons.  Patient does not have a coherent complaint but seems confused.  She is weak in both her lower extremities, but has no findings concerning for cauda equina, denies back pain.  We  will check CT head.  She appears significantly dehydrated with AKI, will hydrate with IV fluids.  No findings concerning for sepsis outside of leukocytosis, will screen chest x-ray and UA but hold off on antibiotics for now.  I suspect she has not been caring for herself at home with poor p.o. intake and not taking her medications.  I do have some concern for alcohol versus benzodiazepine withdrawal given her tremulousness, but this also could be attributed to Parkinson's disease and patient is unable to state if she is typically this tremulous.  We will trial dose of Ativan and will need to be reevaluated once she receives her regular Parkinson's disease medications.  Plan for admission for hydration with IV fluids given her AKI.  Patient noted to be bradycardic with irregular rhythm on EKG, blood pressure remained stable.  This could potentially be related to her hyperkalemia, although it is mild.  We will treat with calcium, reduced dose of insulin given her AKI, and high-dose albuterol.  Patient now receiving IV fluids, will hold off on Ativan at this time and continue to monitor.  Case discussed with hospitalist, who accepts patient for admission.      ____________________________________________   FINAL CLINICAL IMPRESSION(S) / ED DIAGNOSES  Final diagnoses:  Altered mental status, unspecified altered mental status type  Dehydration  Hyperkalemia  AKI (acute kidney injury)  [facility])     ED Discharge Orders    None       Note:  This document was prepared using Dragon voice recognition software and may include unintentional dictation errors.   Blake Divine, MD 06/21/2019 1536

## 2019-05-31 NOTE — ED Notes (Signed)
NT and this RN at bedside completing I&O cath for urine sample. Malodorous/amber urine. Pt's BP currently 132/65. Pt A&Ox4 but will nod off. Easily woken. Blood culture supplies, dark grn tube with ice and two Lt grn tubes at bedside to collect rest of bloodwork. Report given to Deneise Lever, Therapist, sports.

## 2019-05-31 NOTE — ED Triage Notes (Signed)
Pt presents to ED via [facility] with c/o confusion. Per EMS pt was D/C this AM. EMS reports EMS was called and upon arrival pt stated to them she didn't know what was wrong she just wanted to go to the hospital. EMS reports that patient does not take medications as prescribed. Pt with noted tremors, hx of parkinsons. Pt presents to ED alert upon arrival.   Pt seen on 12/1 and D/C home with home health.

## 2019-05-31 NOTE — ED Notes (Signed)
500cc bolus of NS running now. Verbal from attending to hang another 500cc to make total of 1L NS bolus.

## 2019-05-31 NOTE — ED Notes (Signed)
First unit blood finished.  Second unit being hung.

## 2019-05-31 NOTE — ED Notes (Signed)
Patient transported to CT 

## 2019-05-31 NOTE — ED Notes (Signed)
Verbal from cards to give 500cc NS bolus.

## 2019-05-31 NOTE — ED Notes (Signed)
Pt calls out, "Help me."  Upon checking on this patient, pt states, "I am trying to go smoke, I brought my own cigarettes."  This RN explained to patient that she is at a non-smoking facility and she will not be allowed to smoke.

## 2019-05-31 NOTE — ED Notes (Addendum)
Charge RN Raquel notified pt needs to be switched back over to main ED from [place]. Raquel states pt will received next bed.

## 2019-05-31 NOTE — ED Notes (Signed)
Respiratory at bedside.  VORB from Dr. Charna Archer for 20 etomidate, 80 roc.

## 2019-06-01 ENCOUNTER — Inpatient Hospital Stay: Payer: Medicare Other

## 2019-06-01 ENCOUNTER — Encounter: Admission: EM | Disposition: E | Payer: Self-pay | Source: Home / Self Care | Attending: Pulmonary Disease

## 2019-06-01 ENCOUNTER — Other Ambulatory Visit: Payer: Medicare Other

## 2019-06-01 ENCOUNTER — Inpatient Hospital Stay
Admit: 2019-06-01 | Discharge: 2019-06-01 | Disposition: A | Discharge status: Home / Self Care | Payer: Medicare Other | Attending: Family Medicine | Admitting: Family Medicine

## 2019-06-01 ENCOUNTER — Encounter: Payer: Self-pay | Admitting: Cardiology

## 2019-06-01 DIAGNOSIS — R4182 Altered mental status, unspecified: Secondary | ICD-10-CM

## 2019-06-01 DIAGNOSIS — R404 Transient alteration of awareness: Secondary | ICD-10-CM | POA: Diagnosis not present

## 2019-06-01 DIAGNOSIS — R570 Cardiogenic shock: Secondary | ICD-10-CM

## 2019-06-01 DIAGNOSIS — E872 Acidosis: Secondary | ICD-10-CM

## 2019-06-01 DIAGNOSIS — R001 Bradycardia, unspecified: Secondary | ICD-10-CM

## 2019-06-01 DIAGNOSIS — J9601 Acute respiratory failure with hypoxia: Secondary | ICD-10-CM | POA: Diagnosis not present

## 2019-06-01 HISTORY — PX: TEMPORARY PACEMAKER: CATH118268

## 2019-06-01 LAB — COMPREHENSIVE METABOLIC PANEL
ALT: 87 U/L — ABNORMAL HIGH (ref 0–44)
AST: 3267 U/L — ABNORMAL HIGH (ref 15–41)
Albumin: 2.8 g/dL — ABNORMAL LOW (ref 3.5–5.0)
Alkaline Phosphatase: 90 U/L (ref 38–126)
Anion gap: 17 — ABNORMAL HIGH (ref 5–15)
BUN: 60 mg/dL — ABNORMAL HIGH (ref 8–23)
CO2: 18 mmol/L — ABNORMAL LOW (ref 22–32)
Calcium: 6.8 mg/dL — ABNORMAL LOW (ref 8.9–10.3)
Chloride: 104 mmol/L (ref 98–111)
Creatinine, Ser: 1.9 mg/dL — ABNORMAL HIGH (ref 0.44–1.00)
GFR calc Af Amer: 32 mL/min — ABNORMAL LOW (ref >60)
GFR calc non Af Amer: 27 mL/min — ABNORMAL LOW (ref >60)
Glucose, Bld: 291 mg/dL — ABNORMAL HIGH (ref 70–99)
Potassium: 4 mmol/L (ref 3.5–5.1)
Sodium: 139 mmol/L (ref 135–145)
Total Bilirubin: 5.2 mg/dL — ABNORMAL HIGH (ref 0.3–1.2)
Total Protein: 4.8 g/dL — ABNORMAL LOW (ref 6.5–8.1)

## 2019-06-01 LAB — TROPONIN I (HIGH SENSITIVITY)
Troponin I (High Sensitivity): 201 ng/L (ref <18)
Troponin I (High Sensitivity): 214 ng/L (ref <18)

## 2019-06-01 LAB — TYPE AND SCREEN
ABO/RH(D): A NEG
Antibody Screen: NEGATIVE
Unit division: 0
Unit division: 0

## 2019-06-01 LAB — HEPATITIS PANEL, ACUTE
HCV Ab: NONREACTIVE
Hep A IgM: NONREACTIVE
Hep B C IgM: NONREACTIVE
Hepatitis B Surface Ag: NONREACTIVE

## 2019-06-01 LAB — HEMOGLOBIN AND HEMATOCRIT, BLOOD
HCT: 40.5 % (ref 36.0–46.0)
HCT: 44.6 % (ref 36.0–46.0)
HCT: 45.2 % (ref 36.0–46.0)
Hemoglobin: 13.3 g/dL (ref 12.0–15.0)
Hemoglobin: 13.9 g/dL (ref 12.0–15.0)
Hemoglobin: 14.4 g/dL (ref 12.0–15.0)

## 2019-06-01 LAB — BLOOD GAS, ARTERIAL
Acid-base deficit: 8.9 mmol/L — ABNORMAL HIGH (ref 0.0–2.0)
Bicarbonate: 16.8 mmol/L — ABNORMAL LOW (ref 20.0–28.0)
FIO2: 100
MECHVT: 400 mL
Mechanical Rate: 16
O2 Saturation: 99.5 %
PEEP: 8 cmH2O
Patient temperature: 37
pCO2 arterial: 35 mmHg (ref 32.0–48.0)
pH, Arterial: 7.29 — ABNORMAL LOW (ref 7.350–7.450)
pO2, Arterial: 187 mmHg — ABNORMAL HIGH (ref 83.0–108.0)

## 2019-06-01 LAB — BASIC METABOLIC PANEL
Anion gap: 20 — ABNORMAL HIGH (ref 5–15)
BUN: 62 mg/dL — ABNORMAL HIGH (ref 8–23)
CO2: 15 mmol/L — ABNORMAL LOW (ref 22–32)
Calcium: 7.4 mg/dL — ABNORMAL LOW (ref 8.9–10.3)
Chloride: 102 mmol/L (ref 98–111)
Creatinine, Ser: 2.02 mg/dL — ABNORMAL HIGH (ref 0.44–1.00)
GFR calc Af Amer: 29 mL/min — ABNORMAL LOW (ref >60)
GFR calc non Af Amer: 25 mL/min — ABNORMAL LOW (ref >60)
Glucose, Bld: 224 mg/dL — ABNORMAL HIGH (ref 70–99)
Potassium: 4.8 mmol/L (ref 3.5–5.1)
Sodium: 137 mmol/L (ref 135–145)

## 2019-06-01 LAB — BPAM RBC
Blood Product Expiration Date: 202101102359
Blood Product Expiration Date: 202101102359
ISSUE DATE / TIME: 202012082058
ISSUE DATE / TIME: 202012082058
Unit Type and Rh: 5100
Unit Type and Rh: 5100

## 2019-06-01 LAB — CBC
HCT: 45 % (ref 36.0–46.0)
Hemoglobin: 14.3 g/dL (ref 12.0–15.0)
MCH: 28.9 pg (ref 26.0–34.0)
MCHC: 31.8 g/dL (ref 30.0–36.0)
MCV: 91.1 fL (ref 80.0–100.0)
Platelets: 158 10*3/uL (ref 150–400)
RBC: 4.94 MIL/uL (ref 3.87–5.11)
RDW: 16.7 % — ABNORMAL HIGH (ref 11.5–15.5)
WBC: 27.4 10*3/uL — ABNORMAL HIGH (ref 4.0–10.5)
nRBC: 0.5 % — ABNORMAL HIGH (ref 0.0–0.2)

## 2019-06-01 LAB — PROTIME-INR
INR: 5.5 (ref 0.8–1.2)
INR: 6.5 (ref 0.8–1.2)
Prothrombin Time: 50.1 seconds — ABNORMAL HIGH (ref 11.4–15.2)
Prothrombin Time: 56.9 seconds — ABNORMAL HIGH (ref 11.4–15.2)

## 2019-06-01 LAB — HEMOGLOBIN A1C
Hgb A1c MFr Bld: 5.2 % (ref 4.8–5.6)
Mean Plasma Glucose: 102.54 mg/dL

## 2019-06-01 LAB — MRSA PCR SCREENING: MRSA by PCR: NEGATIVE

## 2019-06-01 LAB — TSH: TSH: 2.738 u[IU]/mL (ref 0.350–4.500)

## 2019-06-01 LAB — GLUCOSE, CAPILLARY
Glucose-Capillary: 125 mg/dL — ABNORMAL HIGH (ref 70–99)
Glucose-Capillary: 133 mg/dL — ABNORMAL HIGH (ref 70–99)
Glucose-Capillary: 176 mg/dL — ABNORMAL HIGH (ref 70–99)
Glucose-Capillary: 177 mg/dL — ABNORMAL HIGH (ref 70–99)
Glucose-Capillary: 225 mg/dL — ABNORMAL HIGH (ref 70–99)
Glucose-Capillary: 263 mg/dL — ABNORMAL HIGH (ref 70–99)

## 2019-06-01 LAB — APTT
aPTT: 66 seconds — ABNORMAL HIGH (ref 24–36)
aPTT: 68 seconds — ABNORMAL HIGH (ref 24–36)

## 2019-06-01 LAB — PROCALCITONIN: Procalcitonin: 0.55 ng/mL

## 2019-06-01 LAB — ACETAMINOPHEN LEVEL: Acetaminophen (Tylenol), Serum: <10 ug/mL — ABNORMAL LOW (ref 10–30)

## 2019-06-01 LAB — BRAIN NATRIURETIC PEPTIDE: B Natriuretic Peptide: 734 pg/mL — ABNORMAL HIGH (ref 0.0–100.0)

## 2019-06-01 LAB — ECHOCARDIOGRAM COMPLETE
Height: 65 in
Weight: 2165.8 oz

## 2019-06-01 LAB — LACTIC ACID, PLASMA
Lactic Acid, Venous: 6.8 mmol/L (ref 0.5–1.9)
Lactic Acid, Venous: 7.2 mmol/L (ref 0.5–1.9)
Lactic Acid, Venous: 9 mmol/L (ref 0.5–1.9)

## 2019-06-01 LAB — FIBRINOGEN: Fibrinogen: 71 mg/dL — CL (ref 210–475)

## 2019-06-01 LAB — CK: Total CK: 3496 U/L — ABNORMAL HIGH (ref 38–234)

## 2019-06-01 SURGERY — TEMPORARY PACEMAKER

## 2019-06-01 MED ORDER — CHLORHEXIDINE GLUCONATE 0.12% ORAL RINSE (MEDLINE KIT)
15.0000 mL | Freq: Two times a day (BID) | OROMUCOSAL | Status: DC
  Administered 2019-06-01 – 2019-06-05 (×8): 15 mL via OROMUCOSAL

## 2019-06-01 MED ORDER — ORAL CARE MOUTH RINSE
15.0000 mL | OROMUCOSAL | Status: DC
  Administered 2019-06-01 – 2019-06-05 (×33): 15 mL via OROMUCOSAL

## 2019-06-01 MED ORDER — HYDRALAZINE HCL 20 MG/ML IJ SOLN
10.0000 mg | INTRAMUSCULAR | Status: DC | PRN
  Administered 2019-06-03: 10 mg via INTRAVENOUS
  Filled 2019-06-01 (×2): qty 1

## 2019-06-01 MED ORDER — HEPARIN (PORCINE) IN NACL 1000-0.9 UT/500ML-% IV SOLN
INTRAVENOUS | Status: DC | PRN
  Administered 2019-06-01: 500 mL

## 2019-06-01 MED ORDER — DEXTROSE 5 % IV SOLN
15.0000 mg/kg/h | INTRAVENOUS | Status: DC
  Administered 2019-06-01: 15 mg/kg/h via INTRAVENOUS
  Filled 2019-06-01 (×2): qty 120

## 2019-06-01 MED ORDER — IPRATROPIUM-ALBUTEROL 0.5-2.5 (3) MG/3ML IN SOLN
3.0000 mL | Freq: Four times a day (QID) | RESPIRATORY_TRACT | Status: DC
  Administered 2019-06-01 – 2019-06-02 (×4): 3 mL via RESPIRATORY_TRACT
  Filled 2019-06-01 (×4): qty 3

## 2019-06-01 MED ORDER — INSULIN ASPART 100 UNIT/ML ~~LOC~~ SOLN
0.0000 [IU] | Freq: Four times a day (QID) | SUBCUTANEOUS | Status: DC
  Administered 2019-06-03 – 2019-06-04 (×2): 1 [IU] via SUBCUTANEOUS
  Filled 2019-06-01 (×3): qty 1

## 2019-06-01 MED ORDER — ALBUTEROL SULFATE (2.5 MG/3ML) 0.083% IN NEBU: 2.5000 mg | INHALATION_SOLUTION | RESPIRATORY_TRACT | Status: DC | PRN

## 2019-06-01 MED ORDER — LACTULOSE ENEMA
300.0000 mL | Freq: Every day | ORAL | Status: DC
  Filled 2019-06-01 (×2): qty 300

## 2019-06-01 MED ORDER — SODIUM CHLORIDE 0.9 % IV SOLN
INTRAVENOUS | Status: AC | PRN
  Administered 2019-06-01: 10 mL/h via INTRAVENOUS

## 2019-06-01 MED ORDER — ACETYLCYSTEINE LOAD VIA INFUSION
150.0000 mg/kg | Freq: Once | INTRAVENOUS | Status: AC
  Administered 2019-06-01: 9210 mg via INTRAVENOUS
  Filled 2019-06-01: qty 231

## 2019-06-01 MED ORDER — VECURONIUM BROMIDE 10 MG IV SOLR
10.0000 mg | Freq: Once | INTRAVENOUS | Status: AC
  Administered 2019-06-01: 10 mg via INTRAVENOUS
  Filled 2019-06-01: qty 10

## 2019-06-01 MED ORDER — SODIUM CHLORIDE 0.9% IV SOLUTION: Freq: Once | INTRAVENOUS | Status: DC

## 2019-06-01 MED ORDER — LABETALOL HCL 5 MG/ML IV SOLN: 10.0000 mg | INTRAVENOUS | Status: DC | PRN

## 2019-06-01 MED ORDER — SODIUM CHLORIDE 0.9 % IV SOLN
INTRAVENOUS | Status: DC
  Administered 2019-06-01: 03:00:00 via INTRAVENOUS

## 2019-06-01 MED ORDER — SODIUM BICARBONATE-DEXTROSE 150-5 MEQ/L-% IV SOLN
150.0000 meq | INTRAVENOUS | Status: DC
  Administered 2019-06-01 (×2): 150 meq via INTRAVENOUS
  Filled 2019-06-01 (×6): qty 1000

## 2019-06-01 MED ORDER — FUROSEMIDE 10 MG/ML IJ SOLN
40.0000 mg | Freq: Once | INTRAMUSCULAR | Status: AC
  Administered 2019-06-01: 40 mg via INTRAVENOUS

## 2019-06-01 MED ORDER — FUROSEMIDE 10 MG/ML IJ SOLN
INTRAMUSCULAR | Status: AC
  Filled 2019-06-01: qty 4

## 2019-06-01 MED ORDER — LACTATED RINGERS IV SOLN: INTRAVENOUS | Status: DC

## 2019-06-01 SURGICAL SUPPLY — 7 items
CABLE ADAPT CONN TEMP 6FT (ADAPTER) ×3 IMPLANT
NEEDLE PERC 18GX7CM (NEEDLE) ×3 IMPLANT
PACK CARDIAC CATH (CUSTOM PROCEDURE TRAY) ×3 IMPLANT
SHEATH AVANTI 6FR X 11CM (SHEATH) ×3 IMPLANT
SLEEVE REPOSITIONING LENGTH 30 (MISCELLANEOUS) ×3 IMPLANT
SUT SILK 0 FSL (SUTURE) ×3 IMPLANT
WIRE PACING TEMP ST TIP 5 (CATHETERS) ×3 IMPLANT

## 2019-06-01 NOTE — Consult Note (Signed)
PHARMACY CONSULT NOTE - FOLLOW UP  Pharmacy Consult for Electrolyte Monitoring and Replacement   Recent Labs: Potassium (mmol/L)  Date Value  06/07/2019 4.0  10/11/2014 4.1   Magnesium (mg/dL)  Date Value  03/31/2019 1.9   Calcium (mg/dL)  Date Value  05/27/2019 6.8 (L)   Calcium, Total (mg/dL)  Date Value  10/11/2014 9.1   Albumin (g/dL)  Date Value  05/30/2019 2.8 (L)  10/11/2014 4.3   Phosphorus (mg/dL)  Date Value  03/31/2019 4.2   Sodium (mmol/L)  Date Value  06/20/2019 139  10/11/2014 73   63 year old female admitted with GI bleed on protonix gtt with PMH of neuropathy, parkinson's syndrome, UTI, bipolar, cancer, and history of recurrent falls. Patient is intubated, but currently NPO.  Patient on lactulose, as ammonia is elevated.  Electrolytes Corrected Ca 7.7 All other electrolytes wnl Goal:  Electrolytes wnl. Pharmacy will monitor with AM labs  Glucose Currently NPO. Patient was hypoglycemic on admission. BG trending up. Sodium bicarb with D5 gtt currently SSI A1c 5.2  Constipation LBM unknown (admit to ICU 12/8) Lactulose enema, senokot-S  Gerald Dexter, PharmD Pharmacy Resident  06/08/2019 3:46 PM

## 2019-06-01 NOTE — Progress Notes (Addendum)
PT Cancellation Note  Patient Details Name: [name] MRN: [id number] DOB: 1955/10/14   Cancelled Treatment:    Reason Eval/Treat Not Completed: Medical issues which prohibited therapy. Consult received and chart reviewed. Patient noted with critically elevated INR (6.5) this AM, contraindicated for exertional activity at this time. Of note, pt also intubated, PT order completed at this time. Please re-consult when patient is more appropriate to meaningfully participate with rehab services.    Lieutenant Diego PT, DPT 8:46 AM,06/14/2019 [phone]

## 2019-06-01 NOTE — Progress Notes (Addendum)
OT Cancellation Note  Patient Details Name: [name] MRN: [id number] DOB: Sep 24, 1955   Cancelled Treatment:    Reason Eval/Treat Not Completed: Medical issues which prohibited therapy  OT Consult received and chart reviewed. Patient noted to have critically elevated INR (6.5) this AM, contraindicated for exertional activity at this time. In addition, pt orally intubated at this time. Will complete order and sign off. Please re-consult as pt becomes more appropriate.   Gerrianne Scale, [place], OTR/L ascom [phone] 06/07/2019, 8:53 AM

## 2019-06-01 NOTE — Procedures (Signed)
Patient Name: [name]  MRN: [id number]  Epilepsy Attending: Lora Havens  Referring Physician/Provider: Darel Hong, NP Date: 06/14/2019 Duration: 24.79mins  Patient history: 63yo F with ams. EEG to evaluate for seizure  Level of alertness: comatose  AEDs during EEG study: Librium, lorazepam  Technical aspects: This EEG study was done with scalp electrodes positioned according to the 10-20 International system of electrode placement. Electrical activity was acquired at a sampling rate of 500Hz  and reviewed with a high frequency filter of 70Hz  and a low frequency filter of 1Hz . EEG data were recorded continuously and digitally stored.   DESCRIPTION: EEG showed continuous generalized 3-5hz  theta-delta slowing admixed with an excessive amount of 15 to 18 Hz, 2-3 uV beta activity with irregular morphology distributed symmetrically and diffusely. Hyperventilation and photic stimulation were not performed.  Episode of left shoulder and feet and head twitching was recorded per eeg tech annotation ( difficult to visualize on video). Concomitant eeg before, during and after the event didn't show any eeg change to suggest seizure.  ABNORMALITY - Continuous slow, generalized - Excessive beta, generalized  IMPRESSION: This study is suggestive of severe diffuse encephalopathy, non specific to etiology.  The excessive beta activity seen in the background is most likely due to the effect of benzodiazepine and is a benign EEG pattern.  One episode of left shoulder, feet and head twitching was recorded without EEG change and was likely non epileptic. No definite seizures or epileptiform discharges were seen throughout the recording. Clinical correlation is recommended  Charlina Dwight Barbra Sarks

## 2019-06-01 NOTE — Progress Notes (Signed)
eeg completed ° °

## 2019-06-01 NOTE — Consult Note (Addendum)
Reason for Consult: AMS Referring Physician: Dr. Wende Crease  CC: confusion   HPI: [name] is an 63 y.o. female  with a past medical history notable for recent COVID-19 infection in October 2020, ETOH abuse, UTI's, Parkinson's, and neuropathy who presents to [facility] ED on 05/29/2019 due to confusion.  Pt was brought in from home as she was incoherent and altered.   Initial workup in the ED revealed AKI, Hyperkalemia, Hypoglycemia, and Leukocytosis.   While awaiting bed placement, she acutely decompensated in the ED, exhibiting hypotension, shortness of breath, hypoxia, and worsening of altered mental status.  She was subsequently intubated in the ED.  Pt was found to be bradycardic with HR 30-40.  She was placed on transcutaneous pacing with improvement in HR and decrease in vasopressor requirements.   Lactic acid is 9.0 and Procalcitonin is 0.30 on admission.   Past Medical History:  Diagnosis Date  . Anxiety   . Anxiety    panic anxiety disorder  . Arthritis   . Asthma    chronic asthmatic bronchitis  . Bipolar disorder ([place])   . Cancer [facility])    Desmoid tumor left forearm  . Depression   . ETOH abuse   . Heart murmur   . Insomnia   . Neuropathy     Past Surgical History:  Procedure Laterality Date  . ABDOMINAL HYSTERECTOMY    . ABDOMINAL HYSTERECTOMY  2007  . 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 ARM AMPUTATION AT SHOULDER Left   . CESAREAN SECTION    . [place]   x 2  . COLONOSCOPY    . ESOPHAGOGASTRODUODENOSCOPY (EGD) WITH PROPOFOL N/A 04/27/2018   Procedure: ESOPHAGOGASTRODUODENOSCOPY (EGD) WITH PROPOFOL;  Surgeon: Toledo, Benay Pike, MD;  Location: [facility] ENDOSCOPY;  Service: Gastroenterology;  Laterality: N/A;  . INCISION AND DRAINAGE Left 04/13/2019   Procedure: INCISION AND DRAINAGE;  Surgeon: Dereck Leep, MD;  Location: [facility] ORS;  Service: Orthopedics;  Laterality: Left;  . JOINT REPLACEMENT Right 2012   hip  . LUMBAR  LAMINECTOMY/DECOMPRESSION MICRODISCECTOMY Left 04/16/2016   Procedure: LUMBAR LAMINECTOMY/DECOMPRESSION MICRODISCECTOMY 1 LEVEL;  Surgeon: Meade Maw, MD;  Location: [facility] ORS;  Service: Neurosurgery;  Laterality: Left;  Left L4-5 far lateral discectomy, left L4-5 laminoforaminotomy  . TEMPORARY PACEMAKER N/A 05/31/2019   Procedure: TEMPORARY PACEMAKER;  Surgeon: Isaias Cowman, MD;  Location: [facility];  Service: Cardiovascular;  Laterality: N/A;  . TOTAL HIP ARTHROPLASTY Left 09/17/2015   Procedure: TOTAL HIP ARTHROPLASTY;  Surgeon: Dereck Leep, MD;  Location: [facility] ORS;  Service: Orthopedics;  Laterality: Left;  . TOTAL HIP REVISION Left 03/16/2019   Procedure: TOTAL HIP REVISION ARTHROPLASTY WITH CONVERSION TO A CONSTRAINED LINER.;  Surgeon: Dereck Leep, MD;  Location: [facility] ORS;  Service: Orthopedics;  Laterality: Left;  . WISDOM TOOTH EXTRACTION      Family History  Problem Relation Age of Onset  . COPD Mother     Social History:  reports that she has been smoking cigarettes. She has been smoking about 0.25 packs per day. She has never used smokeless tobacco. She reports current alcohol use. She reports that she does not use drugs.  Allergies  Allergen Reactions  . Benadryl [Diphenhydramine] Hives and Other (See Comments)    Reaction:  Hyperactivity   . Benadryl [Diphenhydramine] Other (See Comments)    My jaw locked and I could not speak, like a stroke.  . Cephalosporins Hives  .  Codeine Nausea And Vomiting    Medications: I have reviewed the patient's current medications.  ROS: Unable to obtain as intubated   Physical Examination: Blood pressure 116/69, pulse 69, temperature 97.9 F (36.6 C), resp. rate 16, height 5\' 5"  (1.651 m), weight 61.4 kg, SpO2 100 %.  Pt is able open eyes to voice Withdraws from pain all her extremities Pupils/corneal/cough/gag present.    Laboratory Studies:   Basic Metabolic Panel: Recent Labs  Lab 05/29/2019 1228  06/12/2019 2049 06/22/2019 0001 05/31/2019 0429  NA 134* 137 137 139  K 5.6* 5.7* 4.8 4.0  CL 97* 106 102 104  CO2 15* 12* 15* 18*  GLUCOSE 56* 184* 224* 291*  BUN 72* 68* 62* 60*  CREATININE 2.09* 2.04* 2.02* 1.90*  CALCIUM 8.9 7.9* 7.4* 6.8*    Liver Function Tests: Recent Labs  Lab 06/17/2019 1228 06/23/2019 2049 06/08/2019 0429  AST 227* 825* 3,267*  ALT 12 24 87*  ALKPHOS 92 111 90  BILITOT 2.9* 3.4* 5.2*  PROT 6.7 5.3* 4.8*  ALBUMIN 4.1 3.0* 2.8*   Recent Labs  Lab 06/19/2019 1228  LIPASE 18   Recent Labs  Lab 06/16/2019 1512  AMMONIA 68*    CBC: Recent Labs  Lab 06/17/2019 1228 06/11/2019 2049 06/18/2019 0001 06/20/2019 0429  WBC 16.0* 23.1*  --  27.4*  NEUTROABS 12.4* 18.3*  --   --   HGB 11.6* 11.2* 13.9 14.3  HCT 36.4 37.3 44.6 45.0  MCV 91.2 98.9  --  91.1  PLT 381 244  --  158    Cardiac Enzymes: Recent Labs  Lab 06/18/2019 2049 06/09/2019 0429  CKTOTAL 3,445* 3,496*    BNP: Invalid input(s): POCBNP  CBG: Recent Labs  Lab 06/03/2019 2011 06/09/2019 2244 05/24/2019 0311 06/02/2019 0448 06/01/19 0808  GLUCAP 79 80 177* 133* 34*    Microbiology: Results for orders placed or performed during the hospital encounter of 06/17/2019  SARS CORONAVIRUS 2 (TAT 6-24 HRS) Nasopharyngeal Nasopharyngeal Swab     Status: None   Collection Time: 06/11/2019  2:54 PM   Specimen: Nasopharyngeal Swab  Result Value Ref Range Status   SARS Coronavirus 2 NEGATIVE NEGATIVE Final    Comment: (NOTE) SARS-CoV-2 target nucleic acids are NOT DETECTED. The SARS-CoV-2 RNA is generally detectable in upper and lower respiratory specimens during the acute phase of infection. Negative results do not preclude SARS-CoV-2 infection, do not rule out co-infections with other pathogens, and should not be used as the sole basis for treatment or other patient management decisions. Negative results must be combined with clinical observations, patient history, and epidemiological information. The  expected result is Negative. Fact Sheet for Patients: SugarRoll.be Fact Sheet for Healthcare Providers: https://www.woods-mathews.com/ This test is not yet approved or cleared by the [place] FDA and  has been authorized for detection and/or diagnosis of SARS-CoV-2 by FDA under an Emergency Use Authorization (EUA). This EUA will remain  in effect (meaning this test can be used) for the duration of the COVID-19 declaration under Section 56 4(b)(1) of the Act, 21 U.S.C. section 360bbb-3(b)(1), unless the authorization is terminated or revoked sooner. Performed at [facility], [place] [address] [place], [place] [address]   CULTURE, BLOOD (ROUTINE X 2) w Reflex to ID Panel     Status: None (Preliminary result)   Collection Time: 06/23/2019  6:18 PM   Specimen: BLOOD  Result Value Ref Range Status   Specimen Description BLOOD BLOOD RIGHT HAND  Final   Special Requests  Final    BOTTLES DRAWN AEROBIC AND ANAEROBIC Blood Culture adequate volume   Culture   Final    NO GROWTH < 12 HOURS Performed at [facility], [address] [place], [place] [address]    Report Status PENDING  Incomplete  Respiratory Panel by RT PCR (Flu A&B, Covid) - Nasopharyngeal Swab     Status: None   Collection Time: 06/11/2019  9:14 PM   Specimen: Nasopharyngeal Swab  Result Value Ref Range Status   SARS Coronavirus 2 by RT PCR NEGATIVE NEGATIVE Final    Comment: (NOTE) SARS-CoV-2 target nucleic acids are NOT DETECTED. The SARS-CoV-2 RNA is generally detectable in upper respiratoy specimens during the acute phase of infection. The lowest concentration of SARS-CoV-2 viral copies this assay can detect is 131 copies/mL. A negative result does not preclude SARS-Cov-2 infection and should not be used as the sole basis for treatment or other patient management decisions. A negative result may occur with  improper specimen collection/handling, submission  of specimen other than nasopharyngeal swab, presence of viral mutation(s) within the areas targeted by this assay, and inadequate number of viral copies (<131 copies/mL). A negative result must be combined with clinical observations, patient history, and epidemiological information. The expected result is Negative. Fact Sheet for Patients:  PinkCheek.be Fact Sheet for Healthcare Providers:  GravelBags.it This test is not yet ap proved or cleared by the [place] FDA and  has been authorized for detection and/or diagnosis of SARS-CoV-2 by FDA under an Emergency Use Authorization (EUA). This EUA will remain  in effect (meaning this test can be used) for the duration of the COVID-19 declaration under Section 564(b)(1) of the Act, 21 U.S.C. section 360bbb-3(b)(1), unless the authorization is terminated or revoked sooner.    Influenza A by PCR NEGATIVE NEGATIVE Final   Influenza B by PCR NEGATIVE NEGATIVE Final    Comment: (NOTE) The Xpert Xpress SARS-CoV-2/FLU/RSV assay is intended as an aid in  the diagnosis of influenza from Nasopharyngeal swab specimens and  should not be used as a sole basis for treatment. Nasal washings and  aspirates are unacceptable for Xpert Xpress SARS-CoV-2/FLU/RSV  testing. Fact Sheet for Patients: PinkCheek.be Fact Sheet for Healthcare Providers: GravelBags.it This test is not yet approved or cleared by the [place] FDA and  has been authorized for detection and/or diagnosis of SARS-CoV-2 by  FDA under an Emergency Use Authorization (EUA). This EUA will remain  in effect (meaning this test can be used) for the duration of the  Covid-19 declaration under Section 564(b)(1) of the Act, 21  U.S.C. section 360bbb-3(b)(1), unless the authorization is  terminated or revoked. Performed at [name], [address]  [place], [place] [address]   MRSA PCR Screening     Status: None   Collection Time: 06/02/2019 10:35 PM   Specimen: Nasopharyngeal  Result Value Ref Range Status   MRSA by PCR NEGATIVE NEGATIVE Final    Comment:        The GeneXpert MRSA Assay (FDA approved for NASAL specimens only), is one component of a comprehensive MRSA colonization surveillance program. It is not intended to diagnose MRSA infection nor to guide or monitor treatment for MRSA infections. Performed at [facility], [address] [place], [place] [address]     Coagulation Studies: Recent Labs    05/29/2019 0001 06/18/2019 0429  LABPROT 50.1* 56.9*  INR 5.5* 6.5*    Urinalysis:  Recent Labs  Lab 05/24/2019 [employer]  LABSPEC 1.019  PHURINE 7.0  GLUCOSEU NEGATIVE  HGBUR NEGATIVE  BILIRUBINUR NEGATIVE  KETONESUR 5*  PROTEINUR >=300*  NITRITE NEGATIVE  LEUKOCYTESUR NEGATIVE    Lipid Panel:     Component Value Date/Time   CHOL 225 (H) 05/12/2017 0706   TRIG 185 (H) 05/12/2017 0706   HDL 58 05/12/2017 0706   CHOLHDL 3.9 05/12/2017 0706   VLDL 37 05/12/2017 0706   LDLCALC 130 (H) 05/12/2017 0706    HgbA1C:  Lab Results  Component Value Date   HGBA1C 5.2 06/03/2019    Urine Drug Screen:      Component Value Date/Time   LABOPIA NONE DETECTED 06/23/2019 1454   LABOPIA NONE DETECTED 12/21/2013 1024   COCAINSCRNUR NONE DETECTED 06/05/2019 1454   LABBENZ POSITIVE (A) 06/09/2019 1454   LABBENZ NONE DETECTED 12/21/2013 1024   AMPHETMU NONE DETECTED 06/20/2019 1454   AMPHETMU NONE DETECTED 12/21/2013 1024   THCU NONE DETECTED 06/22/2019 1454   THCU NONE DETECTED 12/21/2013 1024   LABBARB NONE DETECTED 06/04/2019 1454   LABBARB NONE DETECTED 12/21/2013 1024    Alcohol Level:  Recent Labs  Lab 06/21/2019 1228  ETH <10      Imaging: Ct Abdomen Pelvis Wo Contrast  Result Date: 06/16/2019 CLINICAL DATA:  Abdominal rigidity EXAM: CT ABDOMEN AND PELVIS WITHOUT CONTRAST  TECHNIQUE: Multidetector CT imaging of the abdomen and pelvis was performed following the standard protocol without IV contrast. COMPARISON:  Ultrasound from 04/01/2019 FINDINGS: Lower chest: Small right pleural effusion is noted with bibasilar atelectasis right greater than left. No focal parenchymal nodules are seen. Gastric catheter is noted extending into the stomach. Hepatobiliary: Liver is well visualized and within normal limits. Gallbladder demonstrates some mild wall thickening likely reactive related to the ascites. Increased density is noted within consistent with cholelithiasis and gallbladder sludge similar to that noted on prior ultrasound. Pancreas: Unremarkable. No pancreatic ductal dilatation or surrounding inflammatory changes. Spleen: Normal in size without focal abnormality. Adrenals/Urinary Tract: Adrenal glands are within normal limits. Kidneys are well visualized bilaterally. No renal calculi or obstructive changes are seen. The bladder is decompressed by Foley catheter and obscured by bilateral hip replacements. Stomach/Bowel: Scattered diverticular change of the colon is noted without evidence of diverticulitis. No obstructive or inflammatory changes are seen. The appendix is within normal limits. Some inflammatory changes are noted surrounding the second portion of the duodenum and head of the pancreas as well as extending into the mesentery inferiorly. This may be related to some focal duodenitis although may be reactive related to the mild ascites. Vascular/Lymphatic: Aortic atherosclerosis. No enlarged abdominal or pelvic lymph nodes. Reproductive: Status post hysterectomy. No adnexal masses. Other: No abdominal wall hernia or abnormality. No abdominopelvic ascites. Musculoskeletal: Bilateral hip replacements are noted. Degenerative changes of lumbar spine are seen. IMPRESSION: Small right pleural effusion with bibasilar atelectasis. Cholelithiasis and gallbladder sludge. Mild ascites.  Inflammatory changes surrounding duodenum and head of the pancreas likely related to some duodenitis. No perforation or definitive ulcer crater is seen. These changes may be in part related to the underlying ascites as well. No other focal abnormality is noted. Electronically Signed   By: Inez Catalina M.D.   On: 05/30/2019 22:28   Ct Head Wo Contrast  Result Date: 06/17/2019 CLINICAL DATA:  Encephalopathy EXAM: CT HEAD WITHOUT CONTRAST TECHNIQUE: Contiguous axial images were obtained from the base of the skull through the vertex without intravenous contrast. COMPARISON:  06/23/2019 FINDINGS: Brain: There is no mass, hemorrhage or extra-axial collection. The size  and configuration of the ventricles and extra-axial CSF spaces are normal. The brain parenchyma is normal, without acute or chronic infarction. Vascular: No abnormal hyperdensity of the major intracranial arteries or dural venous sinuses. No intracranial atherosclerosis. Skull: The visualized skull base, calvarium and extracranial soft tissues are normal. Sinuses/Orbits: No fluid levels or advanced mucosal thickening of the visualized paranasal sinuses. No mastoid or middle ear effusion. The orbits are normal. IMPRESSION: Normal head CT. Electronically Signed   By: Ulyses Jarred M.D.   On: 06/16/2019 02:53   Ct Head Wo Contrast  Result Date: 06/16/2019 CLINICAL DATA:  Altered level of consciousness. Confusion. Parkinson's. EXAM: CT HEAD WITHOUT CONTRAST TECHNIQUE: Contiguous axial images were obtained from the base of the skull through the vertex without intravenous contrast. COMPARISON:  April 13, 2019 FINDINGS: Brain: No subdural, epidural, or subarachnoid hemorrhage identified. Cerebellum, brainstem, and basal cisterns are normal. No mass effect or midline shift. No acute cortical ischemia or infarct identified. Vascular: No hyperdense vessel or unexpected calcification. Skull: Normal. Negative for fracture or focal lesion. Sinuses/Orbits: No  acute finding. Other: None. IMPRESSION: 1. No acute intracranial abnormalities are identified. 2. Electronically Signed   By: Dorise Bullion III M.D   On: 06/07/2019 14:29   Dg Chest Port 1 View  Result Date: 05/27/2019 CLINICAL DATA:  Central line placement. EXAM: PORTABLE CHEST 1 VIEW COMPARISON:  Radiograph earlier this day. FINDINGS: Tip of the right internal jugular central venous catheter projects over the upper SVC. No visualized pneumothorax. Endotracheal tube and enteric tube remain in place. Borderline cardiomegaly. Unchanged vascular congestion. No new airspace disease. Streaky bibasilar atelectasis. IMPRESSION: 1. Tip of the right internal jugular central venous catheter projects over the upper SVC. No pneumothorax. 2. Unchanged vascular congestion. 3. Streaky bibasilar atelectasis. Electronically Signed   By: Keith Rake M.D.   On: 06/20/2019 00:19   Dg Chest Portable 1 View  Result Date: 05/28/2019 CLINICAL DATA:  Respiratory distress. Endotracheal tube and OG tube placement. EXAM: PORTABLE CHEST 1 VIEW COMPARISON:  06/10/2019 FINDINGS: Endotracheal tube is noted with tip 2.8 cm above the carina. An OG tube is noted entering the stomach with tip just off of the field of view. This is a mildly low volume film. Cardiomegaly and mild pulmonary vascular congestion noted. No pneumothorax or pleural effusion. Defibrillator/pacing pads overlying the chest noted. No acute bony abnormalities are identified. IMPRESSION: 1. Endotracheal tube with tip 2.8 cm above the carina. 2. OG tube entering the stomach with tip just off of the field of view. 3. Cardiomegaly with mild pulmonary vascular congestion. Electronically Signed   By: Margarette Canada M.D.   On: 05/29/2019 20:51   Dg Chest Portable 1 View  Result Date: 06/05/2019 CLINICAL DATA:  Altered mental status. EXAM: PORTABLE CHEST 1 VIEW COMPARISON:  April 17, 2019. FINDINGS: The heart size and mediastinal contours are within normal limits. Both  lungs are clear. The visualized skeletal structures are unremarkable. IMPRESSION: No active disease. Electronically Signed   By: Marijo Conception M.D.   On: 06/22/2019 14:02   Dg Abd Portable 1 View  Result Date: 06/05/2019 CLINICAL DATA:  OG tube placement. EXAM: PORTABLE ABDOMEN - 1 VIEW COMPARISON:  None. FINDINGS: An OG tube is noted with tip overlying the mid stomach. Bowel gas pattern is unremarkable. IMPRESSION: 1. OG tube with tip overlying the mid stomach. 2. Unremarkable bowel gas pattern. Electronically Signed   By: Margarette Canada M.D.   On: 05/31/2019 20:52     Assessment/Plan:  63 y.o. female  with a past medical history notable for recent COVID-19 infection in October 2020, ETOH abuse, UTI's, Parkinson's, and neuropathy who presents to [facility] ED on 05/28/2019 due to confusion.  Pt was brought in from home as she was incoherent and altered.   Initial workup in the ED revealed AKI, Hyperkalemia, Hypoglycemia, and Leukocytosis.   While awaiting bed placement, she acutely decompensated in the ED, exhibiting hypotension, shortness of breath, hypoxia, and worsening of altered mental status.  She was subsequently intubated in the ED.  Pt was found to be bradycardic with HR 30-40.  She was placed on transcutaneous pacing with improvement in HR and decrease in vasopressor requirements.   Lactic acid is 9.0 and Procalcitonin is 0.30 on admission.   -  Neurology consulted for confusion/altered mental status - Current condition explained by metabolic encephalopathy due to hypotension, AKI, etc.. - Would hold off further imaging from Neurological stand point unless there is any focality on examination   05/26/2019, 11:21 AM

## 2019-06-01 NOTE — Progress Notes (Signed)
Initial Nutrition Assessment  DOCUMENTATION CODES:   Not applicable  INTERVENTION:  Patient is too unstable for enteral nutrition at this time and has active GI bleed.  Once patient is stable enough for tube feeds recommend: -Vital AF 1.2 Cal at 55 mL/hr (1320 ml goal daily volume); provides 1584 kcal, 99 grams of protein, 1069 mL H2O daily -Minimum free water flush of 30 mL Q4hrs to maintain tube patency  NUTRITION DIAGNOSIS:   Inadequate oral intake related to inability to eat as evidenced by NPO status.  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   Patient is intubated but is not requiring any sedation. On PRVC mode with FiO2 50% and PEEP 8 cmH2O. According to RN documentation patient is unresponsive, unable to follow commands, and has a Glasgow coma score of 4. Abdomen soft. Last BM unknown/PTA. NGT to LIS with dark red output in room at time of RD assessment (documented as blood in chart). Bair Hugger in place this morning.  Enteral Access: 18 Fr. OGT placed 12/8; terminates in mid stomach per abdominal x-ray 12/8; 50 cm at corner of mouth  MAP: 46-92 mmHg  Patient is currently intubated on ventilator support Ve: 9.2 L/min Temp (24hrs), Avg:97.4 F (36.3 C), Min:93.7 F (34.3 C), Max:100.4 F (38 C)  Propofol: N/A  Medications reviewed and include: lactulose 300 mL RE, pantoprazole, NS at 75 mL/hr, Zosyn, sodium bicarbonate 150 mEq in D5 at 125 mL/hr.  Labs reviewed: CBG 133-263, CO2 18, BUN 60, Creatinine 1.9.  I/O: 160 mL UOP  Yesterday; 0 mL UOP so far today  Patient is at risk for malnutrition.  NUTRITION - FOCUSED PHYSICAL EXAM:    Most Recent Value  Orbital Region  Mild depletion  Upper Arm Region  No depletion [s/p amputation of left arm]  Thoracic and Lumbar Region  No depletion  Buccal Region  Unable to assess  Temple Region  Mild depletion  Clavicle Bone Region  No depletion  Clavicle and Acromion Bone Region  Mild depletion  Scapular Bone Region  Unable to assess  Dorsal Hand  No depletion  Patellar Region  No depletion  Anterior Thigh Region  No depletion  Posterior Calf Region  Mild depletion  Edema (RD Assessment)  None  Hair  Reviewed  Eyes  Unable to assess  Mouth  Unable to assess  Skin  Reviewed  Nails  Reviewed     Diet Order:   Diet Order            Diet NPO time specified  Diet effective now             EDUCATION NEEDS:   No education needs have been identified at this time  Skin:  Skin Assessment: Reviewed RN Assessment  Last BM:  Unknown/PTA  Height:   Ht Readings from Last 1 Encounters:  06/08/2019 5\' 5"  (1.651 m)   Weight:   Wt Readings from Last 1 Encounters:  06/14/2019 61.4 kg   Ideal Body Weight:     BMI:  Body mass index is 22.53 kg/m.  Estimated Nutritional Needs:   Kcal:  1520 (PSU 2003b w/ MSJ 1164, Ve 9.2, Tmax 37.9)  Protein:  80-100 grams (1.3-1.6 grams/kg)  Fluid:  1.5-1.8 L/day  Jacklynn Barnacle, MS, RD, LDN Office: [phone] Pager: [phone] After Hours/Weekend Pager: [phone]

## 2019-06-01 NOTE — Consult Note (Signed)
[address] [place], [place] [address] Phone [phone]. Fax [phone]  Date: 06/07/2019                  Patient Name:  [name]  MRN: [id number]  DOB: September 28, 1955  Age / Sex: 63 y.o., female         PCP: Dion Body, MD                 Service Requesting Consult: IM/ Frederik Pear, MD                 Reason for Consult: ARF            History of Present Illness: Patient is a 63 y.o. female with medical problems of parkinsons, alcohol abuse, anxiety, Bipolar Ds who was admitted to [facility] on 06/18/2019 for evaluation of confusion.   Renal consult for AKI Renal u.s report pending Baseline cr 0.85 (05/20/2019) Increased to 2.09 (05/24/2019) Elevated CK >  3400 Elevated ammonia level, LFTs, lactic acid   In the emergency room, patient decompensated around 9 PM.  Became hypotensive with OG tube with frank blood.  Started on Levophed.  Transferred to ICU.  Intubated in the emergency room December 8.  2 unit blood transfusion.  Required transvenous pacemaker for atrial fibrillation with severe bradycardia and hypotension.  Currently on ventilator, FiO2 60% No sedation, no pressors OG tube with dark aspirate, IV PPI Foley in place, getting IV bicarbonate 125 cc/h   Medications: Outpatient medications: Medications Prior to Admission  Medication Sig Dispense Refill Last Dose  . acetaminophen (TYLENOL) 500 MG tablet Take 1,000 mg by mouth every 6 (six) hours as needed for mild pain or moderate pain.   Unknown at PRN  . amantadine (SYMMETREL) 100 MG capsule Take 200 mg by mouth 2 (two) times daily.   Unknown at Unknown  . benztropine (COGENTIN) 1 MG tablet Take 1 mg by mouth at bedtime.   Unknown at Unknown  . calcium carbonate (OSCAL) 1500 (600 Ca) MG TABS tablet Take 1,500 mg by mouth daily.   Unknown at Unknown  . carbidopa-levodopa (SINEMET IR) 25-100 MG tablet Take 2 tablets by mouth 3 (three) times daily.   Unknown at Unknown  . celecoxib  (CELEBREX) 200 MG capsule Take 200 mg by mouth 2 (two) times daily.   Unknown at Unknown  . gabapentin (NEURONTIN) 300 MG capsule Take 900 mg by mouth 3 (three) times daily.   Unknown at Unknown  . LORazepam (ATIVAN) 0.5 MG tablet Take 0.5 mg by mouth daily as needed for anxiety.   Unknown at PRN  . propranolol (INDERAL) 20 MG tablet Take 20-30 mg by mouth See admin instructions. Take 1 tablets (30mg ) by mouth every morning, 1 tablet (20mg ) by mouth at lunchtime and take 1 tablets (30mg ) by mouth every night   Unknown at Unknown  . traZODone (DESYREL) 150 MG tablet Take 300 mg by mouth at bedtime.   Unknown at Unknown  . venlafaxine XR (EFFEXOR-XR) 150 MG 24 hr capsule Take 300 mg by mouth daily with breakfast.   Unknown at Unknown    Current medications: Current Facility-Administered Medications  Medication Dose Route Frequency Provider Last Rate Last Dose  . 0.9 %  sodium chloride infusion (Manually program via Guardrails IV Fluids)   Intravenous Once Blake Divine, MD      . 0.9 %  sodium chloride infusion (Manually program via Guardrails IV Fluids)   Intravenous Once Bradly Bienenstock, NP      .  0.9 %  sodium chloride infusion   Intravenous Continuous Bradly Bienenstock, NP 75 mL/hr at 05/25/2019 0536    . acetaminophen (TYLENOL) tablet 650 mg  650 mg Oral Q6H PRN Thornell Mule, MD       Or  . acetaminophen (TYLENOL) suppository 650 mg  650 mg Rectal Q6H PRN Thornell Mule, MD      . atropine 1 MG/10ML injection 1 mg  1 mg Intravenous PRN Darel Hong D, NP      . chlordiazePOXIDE (LIBRIUM) capsule 25 mg  25 mg Oral TID Thornell Mule, MD   25 mg at 06/04/2019 1807  . Chlorhexidine Gluconate Cloth 2 % PADS 6 each  6 each Topical Daily Darel Hong D, NP      . dexmedetomidine (PRECEDEX) 400 MCG/100ML (4 mcg/mL) infusion  0-1.2 mcg/kg/hr Intravenous Continuous Blake Divine, MD   Stopped at 06/07/2019 2258  . fentaNYL (SUBLIMAZE) injection 25 mcg  25 mcg Intravenous Q15 min PRN Darel Hong D, NP      . fentaNYL (SUBLIMAZE) injection 25-100 mcg  25-100 mcg Intravenous Q30 min PRN Darel Hong D, NP      . ipratropium-albuterol (DUONEB) 0.5-2.5 (3) MG/3ML nebulizer solution 3 mL  3 mL Nebulization Q4H PRN Darel Hong D, NP      . lactulose [facility] enema 200 gm  300 mL Rectal Daily Darel Hong D, NP      . LORazepam (ATIVAN) injection 1 mg  1 mg Intravenous Once Blake Divine, MD   Stopped at 06/17/2019 1526  . norepinephrine (LEVOPHED) 4 mg in dextrose 5 % 250 mL (0.016 mg/mL) infusion  0-40 mcg/min Intravenous Continuous Thornell Mule, MD   Stopped at 06/08/2019 0045  . ondansetron (ZOFRAN) tablet 4 mg  4 mg Oral Q6H PRN Thornell Mule, MD       Or  . ondansetron (ZOFRAN) injection 4 mg  4 mg Intravenous Q6H PRN Thornell Mule, MD      . pantoprazole (PROTONIX) 80 mg in sodium chloride 0.9 % 100 mL IVPB  80 mg Intravenous Once Darel Hong D, NP      . pantoprazole (PROTONIX) 80 mg in sodium chloride 0.9 % 250 mL (0.32 mg/mL) infusion  8 mg/hr Intravenous Continuous Darel Hong D, NP 25 mL/hr at 05/25/2019 0111 8 mg/hr at 06/19/2019 0111  . [START ON 06/04/2019] pantoprazole (PROTONIX) injection 40 mg  40 mg Intravenous Q12H Darel Hong D, NP      . piperacillin-tazobactam (ZOSYN) IVPB 3.375 g  3.375 g Intravenous Q8H Thornell Mule, MD 12.5 mL/hr at 06/17/2019 0759 3.375 g at 05/29/2019 0759  . senna-docusate (Senokot-S) tablet 1 tablet  1 tablet Oral QHS PRN Thornell Mule, MD      . sodium bicarbonate 150 mEq in dextrose 5% 1000 mL infusion  150 mEq Intravenous Continuous Darel Hong D, NP 125 mL/hr at 05/31/2019 0254 150 mEq at 06/17/2019 0254  . sodium chloride flush (NS) 0.9 % injection 3 mL  3 mL Intravenous Q12H Thornell Mule, MD   3 mL at 06/20/2019 2047  . venlafaxine XR (EFFEXOR-XR) 24 hr capsule 300 mg  300 mg Oral Q breakfast Thornell Mule, MD   Stopped at 06/21/2019 0809      Allergies: Allergies  Allergen Reactions  . Benadryl  [Diphenhydramine] Hives and Other (See Comments)    Reaction:  Hyperactivity   . Benadryl [Diphenhydramine] Other (See Comments)    My jaw locked and I could not speak, like a stroke.  . Cephalosporins Hives  .  Codeine Nausea And Vomiting      Past Medical History: Past Medical History:  Diagnosis Date  . Anxiety   . Anxiety    panic anxiety disorder  . Arthritis   . Asthma    chronic asthmatic bronchitis  . Bipolar disorder ([place])   . Cancer [facility])    Desmoid tumor left forearm  . Depression   . ETOH abuse   . Heart murmur   . Insomnia   . Neuropathy      Past Surgical History: Past Surgical History:  Procedure Laterality Date  . ABDOMINAL HYSTERECTOMY    . ABDOMINAL HYSTERECTOMY  2007  . 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 ARM AMPUTATION AT SHOULDER Left   . CESAREAN SECTION    . [place]   x 2  . COLONOSCOPY    . ESOPHAGOGASTRODUODENOSCOPY (EGD) WITH PROPOFOL N/A 04/27/2018   Procedure: ESOPHAGOGASTRODUODENOSCOPY (EGD) WITH PROPOFOL;  Surgeon: Toledo, Benay Pike, MD;  Location: [facility] ENDOSCOPY;  Service: Gastroenterology;  Laterality: N/A;  . INCISION AND DRAINAGE Left 04/13/2019   Procedure: INCISION AND DRAINAGE;  Surgeon: Dereck Leep, MD;  Location: [facility] ORS;  Service: Orthopedics;  Laterality: Left;  . JOINT REPLACEMENT Right 2012   hip  . LUMBAR LAMINECTOMY/DECOMPRESSION MICRODISCECTOMY Left 04/16/2016   Procedure: LUMBAR LAMINECTOMY/DECOMPRESSION MICRODISCECTOMY 1 LEVEL;  Surgeon: Meade Maw, MD;  Location: [facility] ORS;  Service: Neurosurgery;  Laterality: Left;  Left L4-5 far lateral discectomy, left L4-5 laminoforaminotomy  . TEMPORARY PACEMAKER N/A 06/02/2019   Procedure: TEMPORARY PACEMAKER;  Surgeon: Isaias Cowman, MD;  Location: [facility];  Service: Cardiovascular;  Laterality: N/A;  . TOTAL HIP ARTHROPLASTY Left 09/17/2015   Procedure: TOTAL HIP ARTHROPLASTY;  Surgeon: Dereck Leep,  MD;  Location: [facility] ORS;  Service: Orthopedics;  Laterality: Left;  . TOTAL HIP REVISION Left 03/16/2019   Procedure: TOTAL HIP REVISION ARTHROPLASTY WITH CONVERSION TO A CONSTRAINED LINER.;  Surgeon: Dereck Leep, MD;  Location: [facility] ORS;  Service: Orthopedics;  Laterality: Left;  . WISDOM TOOTH EXTRACTION       Family History: Family History  Problem Relation Age of Onset  . COPD Mother      Social History: Social History   Socioeconomic History  . Marital status: Divorced    Spouse name: Not on file  . Number of children: Not on file  . Years of education: Not on file  . Highest education level: Not on file  Occupational History  . Not on file  Social Needs  . Financial resource strain: Not on file  . Food insecurity    Worry: Not on file    Inability: Not on file  . Transportation needs    Medical: Not on file    Non-medical: Not on file  Tobacco Use  . Smoking status: Current Every Day Smoker    Packs/day: 0.25    Types: Cigarettes  . Smokeless tobacco: Never Used  Substance and Sexual Activity  . Alcohol use: Yes    Comment: 2 40oz beers daily  . Drug use: No    Comment: Patient denies   . Sexual activity: Yes  Lifestyle  . Physical activity    Days per week: Not on file    Minutes per session: Not on file  . Stress: Not on file  Relationships  . Social Herbalist on phone: Not on file    Gets together: Not on file  Attends religious service: Not on file    Active member of club or organization: Not on file    Attends meetings of clubs or organizations: Not on file    Relationship status: Not on file  . Intimate partner violence    Fear of current or ex partner: Not on file    Emotionally abused: Not on file    Physically abused: Not on file    Forced sexual activity: Not on file  Other Topics Concern  . Not on file  Social History Narrative   ** Merged History Encounter **         Review of Systems: Not available, patient  intubated, nonresponsive Gen:  HEENT:  CV:  Resp:  GI: GU :  MS:  Derm:    Psych: Heme:  Neuro:  Endocrine  Vital Signs: Blood pressure 116/69, pulse 69, temperature 97.9 F (36.6 C), resp. rate 16, height 5\' 5"  (1.651 m), weight 61.4 kg, SpO2 100 %.   Intake/Output Summary (Last 24 hours) at 05/30/2019 1108 Last data filed at 06/15/2019 0900 Gross per 24 hour  Intake 2012.37 ml  Output 160 ml  Net 1852.37 ml    Weight trends: [employer]   05/27/2019 1207 06/05/2019 0438  Weight: 58.5 kg 61.4 kg    Physical Exam: General:  Critically ill-appearing, warming blanket in place  HEENT  pupils dilated, round, 5 to 6 mm, sluggish reaction to light, some nystagmus, OGT, ETT  Lungs:  Ventilator assisted, coarse breath sounds  Heart::  Paced rhythm, temporary venous pacemaker in place  Abdomen:  Soft, nondistended  Extremities:  Trace edema  Neurologic:  Did not respond to verbal or tactile stimuli  Skin:  Warm  Foley:  In place       Lab results: Basic Metabolic Panel: Recent Labs  Lab 05/25/2019 2049 06/04/2019 0001 05/25/2019 0429  NA 137 137 139  K 5.7* 4.8 4.0  CL 106 102 104  CO2 12* 15* 18*  GLUCOSE 184* 224* 291*  BUN 68* 62* 60*  CREATININE 2.04* 2.02* 1.90*  CALCIUM 7.9* 7.4* 6.8*    Liver Function Tests: Recent Labs  Lab 06/15/2019 0429  AST 3,267*  ALT 87*  ALKPHOS 90  BILITOT 5.2*  PROT 4.8*  ALBUMIN 2.8*   Recent Labs  Lab 06/05/2019 1228  LIPASE 18   Recent Labs  Lab 05/28/2019 1512  AMMONIA 68*    CBC: Recent Labs  Lab 06/19/2019 1228 06/14/2019 2049 06/07/2019 0001 06/11/2019 0429  WBC 16.0* 23.1*  --  27.4*  NEUTROABS 12.4* 18.3*  --   --   HGB 11.6* 11.2* 13.9 14.3  HCT 36.4 37.3 44.6 45.0  MCV 91.2 98.9  --  91.1  PLT 381 244  --  158    Cardiac Enzymes: Recent Labs  Lab 06/17/2019 0429  CKTOTAL 3,496*    BNP: Invalid input(s): POCBNP  CBG: Recent Labs  Lab 06/16/2019 2011 06/08/2019 2244 05/31/2019 0311 06/15/2019 0448  06/01/19 0808  GLUCAP 79 80 177* 133* 263*    Microbiology: Recent Results (from the past 720 hour(s))  SARS CORONAVIRUS 2 (TAT 6-24 HRS) Nasopharyngeal Nasopharyngeal Swab     Status: None   Collection Time: 06/05/2019  2:54 PM   Specimen: Nasopharyngeal Swab  Result Value Ref Range Status   SARS Coronavirus 2 NEGATIVE NEGATIVE Final    Comment: (NOTE) SARS-CoV-2 target nucleic acids are NOT DETECTED. The SARS-CoV-2 RNA is generally detectable in upper and lower respiratory specimens during the acute phase  of infection. Negative results do not preclude SARS-CoV-2 infection, do not rule out co-infections with other pathogens, and should not be used as the sole basis for treatment or other patient management decisions. Negative results must be combined with clinical observations, patient history, and epidemiological information. The expected result is Negative. Fact Sheet for Patients: SugarRoll.be Fact Sheet for Healthcare Providers: https://www.woods-mathews.com/ This test is not yet approved or cleared by the [place] FDA and  has been authorized for detection and/or diagnosis of SARS-CoV-2 by FDA under an Emergency Use Authorization (EUA). This EUA will remain  in effect (meaning this test can be used) for the duration of the COVID-19 declaration under Section 56 4(b)(1) of the Act, 21 U.S.C. section 360bbb-3(b)(1), unless the authorization is terminated or revoked sooner. Performed at [facility], [place] [address], [place], [place] [address]   CULTURE, BLOOD (ROUTINE X 2) w Reflex to ID Panel     Status: None (Preliminary result)   Collection Time: 06/20/2019  6:18 PM   Specimen: BLOOD  Result Value Ref Range Status   Specimen Description BLOOD BLOOD RIGHT HAND  Final   Special Requests   Final    BOTTLES DRAWN AEROBIC AND ANAEROBIC Blood Culture adequate volume   Culture   Final    NO GROWTH < 12 HOURS Performed at  [facility], [address], [place], [place] [address]    Report Status PENDING  Incomplete  Respiratory Panel by RT PCR (Flu A&B, Covid) - Nasopharyngeal Swab     Status: None   Collection Time: 06/19/2019  9:14 PM   Specimen: Nasopharyngeal Swab  Result Value Ref Range Status   SARS Coronavirus 2 by RT PCR NEGATIVE NEGATIVE Final    Comment: (NOTE) SARS-CoV-2 target nucleic acids are NOT DETECTED. The SARS-CoV-2 RNA is generally detectable in upper respiratoy specimens during the acute phase of infection. The lowest concentration of SARS-CoV-2 viral copies this assay can detect is 131 copies/mL. A negative result does not preclude SARS-Cov-2 infection and should not be used as the sole basis for treatment or other patient management decisions. A negative result may occur with  improper specimen collection/handling, submission of specimen other than nasopharyngeal swab, presence of viral mutation(s) within the areas targeted by this assay, and inadequate number of viral copies (<131 copies/mL). A negative result must be combined with clinical observations, patient history, and epidemiological information. The expected result is Negative. Fact Sheet for Patients:  PinkCheek.be Fact Sheet for Healthcare Providers:  GravelBags.it This test is not yet ap proved or cleared by the [place] FDA and  has been authorized for detection and/or diagnosis of SARS-CoV-2 by FDA under an Emergency Use Authorization (EUA). This EUA will remain  in effect (meaning this test can be used) for the duration of the COVID-19 declaration under Section 564(b)(1) of the Act, 21 U.S.C. section 360bbb-3(b)(1), unless the authorization is terminated or revoked sooner.    Influenza A by PCR NEGATIVE NEGATIVE Final   Influenza B by PCR NEGATIVE NEGATIVE Final    Comment: (NOTE) The Xpert Xpress SARS-CoV-2/FLU/RSV assay is intended as an  aid in  the diagnosis of influenza from Nasopharyngeal swab specimens and  should not be used as a sole basis for treatment. Nasal washings and  aspirates are unacceptable for Xpert Xpress SARS-CoV-2/FLU/RSV  testing. Fact Sheet for Patients: PinkCheek.be Fact Sheet for Healthcare Providers: GravelBags.it This test is not yet approved or cleared by the [place] and  has been authorized  for detection and/or diagnosis of SARS-CoV-2 by  FDA under an Emergency Use Authorization (EUA). This EUA will remain  in effect (meaning this test can be used) for the duration of the  Covid-19 declaration under Section 564(b)(1) of the Act, 21  U.S.C. section 360bbb-3(b)(1), unless the authorization is  terminated or revoked. Performed at [name], [address] [place] [place], [place] [address]   MRSA PCR Screening     Status: None   Collection Time: 06/22/2019 10:35 PM   Specimen: Nasopharyngeal  Result Value Ref Range Status   MRSA by PCR NEGATIVE NEGATIVE Final    Comment:        The GeneXpert MRSA Assay (FDA approved for NASAL specimens only), is one component of a comprehensive MRSA colonization surveillance program. It is not intended to diagnose MRSA infection nor to guide or monitor treatment for MRSA infections. Performed at [facility], [address] [place], [place] [address]      Coagulation Studies: Recent Labs    06/11/2019 0001 05/27/2019 0429  LABPROT 50.1* 56.9*  INR 5.5* 6.5*    Urinalysis: Recent Labs    06/04/2019 1454  COLORURINE AMBER*  LABSPEC 1.019  PHURINE 7.0  GLUCOSEU NEGATIVE  HGBUR NEGATIVE  BILIRUBINUR NEGATIVE  KETONESUR 5*  PROTEINUR >=300*  NITRITE NEGATIVE  LEUKOCYTESUR NEGATIVE        Imaging: Ct Abdomen Pelvis Wo Contrast  Result Date: 06/08/2019 CLINICAL DATA:  Abdominal rigidity EXAM: CT ABDOMEN AND PELVIS WITHOUT CONTRAST TECHNIQUE: Multidetector  CT imaging of the abdomen and pelvis was performed following the standard protocol without IV contrast. COMPARISON:  Ultrasound from 04/01/2019 FINDINGS: Lower chest: Small right pleural effusion is noted with bibasilar atelectasis right greater than left. No focal parenchymal nodules are seen. Gastric catheter is noted extending into the stomach. Hepatobiliary: Liver is well visualized and within normal limits. Gallbladder demonstrates some mild wall thickening likely reactive related to the ascites. Increased density is noted within consistent with cholelithiasis and gallbladder sludge similar to that noted on prior ultrasound. Pancreas: Unremarkable. No pancreatic ductal dilatation or surrounding inflammatory changes. Spleen: Normal in size without focal abnormality. Adrenals/Urinary Tract: Adrenal glands are within normal limits. Kidneys are well visualized bilaterally. No renal calculi or obstructive changes are seen. The bladder is decompressed by Foley catheter and obscured by bilateral hip replacements. Stomach/Bowel: Scattered diverticular change of the colon is noted without evidence of diverticulitis. No obstructive or inflammatory changes are seen. The appendix is within normal limits. Some inflammatory changes are noted surrounding the second portion of the duodenum and head of the pancreas as well as extending into the mesentery inferiorly. This may be related to some focal duodenitis although may be reactive related to the mild ascites. Vascular/Lymphatic: Aortic atherosclerosis. No enlarged abdominal or pelvic lymph nodes. Reproductive: Status post hysterectomy. No adnexal masses. Other: No abdominal wall hernia or abnormality. No abdominopelvic ascites. Musculoskeletal: Bilateral hip replacements are noted. Degenerative changes of lumbar spine are seen. IMPRESSION: Small right pleural effusion with bibasilar atelectasis. Cholelithiasis and gallbladder sludge. Mild ascites. Inflammatory changes  surrounding duodenum and head of the pancreas likely related to some duodenitis. No perforation or definitive ulcer crater is seen. These changes may be in part related to the underlying ascites as well. No other focal abnormality is noted. Electronically Signed   By: Inez Catalina M.D.   On: 06/11/2019 22:28   Ct Head Wo Contrast  Result Date: 06/23/2019 CLINICAL DATA:  Encephalopathy EXAM: CT HEAD WITHOUT CONTRAST TECHNIQUE: Contiguous  axial images were obtained from the base of the skull through the vertex without intravenous contrast. COMPARISON:  06/14/2019 FINDINGS: Brain: There is no mass, hemorrhage or extra-axial collection. The size and configuration of the ventricles and extra-axial CSF spaces are normal. The brain parenchyma is normal, without acute or chronic infarction. Vascular: No abnormal hyperdensity of the major intracranial arteries or dural venous sinuses. No intracranial atherosclerosis. Skull: The visualized skull base, calvarium and extracranial soft tissues are normal. Sinuses/Orbits: No fluid levels or advanced mucosal thickening of the visualized paranasal sinuses. No mastoid or middle ear effusion. The orbits are normal. IMPRESSION: Normal head CT. Electronically Signed   By: Ulyses Jarred M.D.   On: 05/27/2019 02:53   Ct Head Wo Contrast  Result Date: 06/09/2019 CLINICAL DATA:  Altered level of consciousness. Confusion. Parkinson's. EXAM: CT HEAD WITHOUT CONTRAST TECHNIQUE: Contiguous axial images were obtained from the base of the skull through the vertex without intravenous contrast. COMPARISON:  April 13, 2019 FINDINGS: Brain: No subdural, epidural, or subarachnoid hemorrhage identified. Cerebellum, brainstem, and basal cisterns are normal. No mass effect or midline shift. No acute cortical ischemia or infarct identified. Vascular: No hyperdense vessel or unexpected calcification. Skull: Normal. Negative for fracture or focal lesion. Sinuses/Orbits: No acute finding. Other:  None. IMPRESSION: 1. No acute intracranial abnormalities are identified. 2. Electronically Signed   By: Dorise Bullion III M.D   On: 06/14/2019 14:29   Dg Chest Port 1 View  Result Date: 06/23/2019 CLINICAL DATA:  Central line placement. EXAM: PORTABLE CHEST 1 VIEW COMPARISON:  Radiograph earlier this day. FINDINGS: Tip of the right internal jugular central venous catheter projects over the upper SVC. No visualized pneumothorax. Endotracheal tube and enteric tube remain in place. Borderline cardiomegaly. Unchanged vascular congestion. No new airspace disease. Streaky bibasilar atelectasis. IMPRESSION: 1. Tip of the right internal jugular central venous catheter projects over the upper SVC. No pneumothorax. 2. Unchanged vascular congestion. 3. Streaky bibasilar atelectasis. Electronically Signed   By: Keith Rake M.D.   On: 06/14/2019 00:19   Dg Chest Portable 1 View  Result Date: 06/13/2019 CLINICAL DATA:  Respiratory distress. Endotracheal tube and OG tube placement. EXAM: PORTABLE CHEST 1 VIEW COMPARISON:  06/04/2019 FINDINGS: Endotracheal tube is noted with tip 2.8 cm above the carina. An OG tube is noted entering the stomach with tip just off of the field of view. This is a mildly low volume film. Cardiomegaly and mild pulmonary vascular congestion noted. No pneumothorax or pleural effusion. Defibrillator/pacing pads overlying the chest noted. No acute bony abnormalities are identified. IMPRESSION: 1. Endotracheal tube with tip 2.8 cm above the carina. 2. OG tube entering the stomach with tip just off of the field of view. 3. Cardiomegaly with mild pulmonary vascular congestion. Electronically Signed   By: Margarette Canada M.D.   On: 06/09/2019 20:51   Dg Chest Portable 1 View  Result Date: 05/30/2019 CLINICAL DATA:  Altered mental status. EXAM: PORTABLE CHEST 1 VIEW COMPARISON:  April 17, 2019. FINDINGS: The heart size and mediastinal contours are within normal limits. Both lungs are clear. The  visualized skeletal structures are unremarkable. IMPRESSION: No active disease. Electronically Signed   By: Marijo Conception M.D.   On: 06/02/2019 14:02   Dg Abd Portable 1 View  Result Date: 06/05/2019 CLINICAL DATA:  OG tube placement. EXAM: PORTABLE ABDOMEN - 1 VIEW COMPARISON:  None. FINDINGS: An OG tube is noted with tip overlying the mid stomach. Bowel gas pattern is unremarkable. IMPRESSION: 1. OG  tube with tip overlying the mid stomach. 2. Unremarkable bowel gas pattern. Electronically Signed   By: Margarette Canada M.D.   On: 06/04/2019 20:52     Assessment & Plan: Pt is a 63 y.o. caucasian  female with Parkinson's, alcohol abuse, anxiety, bipolar disorder, amputation, history of left hip arthroplasty , right hip replacement, back surgery was admitted on 06/09/2019 with altered mental status, Acute resp failure, coagulopathy and arrhythmia/bradycardia.   #Acute kidney injury. Baseline creatinine of 0.85 on May 20, 2019 #Proteinuria #Rhabdomyolysis #Atrial fibrillation with bradycardia, requiring temporary pacemaker placement #Acute respiratory failure.  Intubated in ER December 8 #Acidosis #Altered mental status #GI bleed, coagulopathy #Hyperglycemia.  Hemoglobin A1c 5.2% June 01, 2019  Kidney injury is likely secondary to ATN from hemodynamic instability.  Patient is currently oligoanuric.  Electrolytes and volume status are acceptable.  No acute indication for dialysis but will continue to monitor daily.   Obtain protein/cr ratio  Continue IV bicarbonate hydration for rhabdomyolysis and acidosis  Temporary pacemaker for bradycardia and atrial fibrillation, cardiology team is following  GI bleed with coagulopathy, reported history of alcohol abuse. Most recent hemoglobin is 14.3 and is being closely monitored. INR 6.5  CT abdomen does not report cirrhosis but ascites and cholelithiasis and gallbladder sludge is noted.       LOS: 1 Tal Neer 12/9/202011:08  AM    Note: This note was prepared with Dragon dictation. Any transcription errors are unintentional

## 2019-06-01 NOTE — Progress Notes (Signed)
Follow up - Critical Care Medicine Note  Patient Details:    [name] is an 63 y.o. female with PMH of ETOH abuse and Parkinson's presented 06/17/2019 with Altered Mental Status.  Found to have AKI, Hypoglycemia, and Leukocytosis.  She decompensated (hypotensive, hypoxic, SOB, worsening mental status) in ED requiring intubation and vasopressors.  Noted to have frank blood in OG tube.  Requiring transcutaneous pacing with plans for transvenous pacer. Concern for Cardiogenic vs. Septic vs. Hemorrhagic shock.  Lines, Airways, Drains: Airway 7.5 mm (Active)  Secured at (cm) 22 cm 05/27/2019 1408  Measured From Lips 06/14/2019 [place] 06/16/2019 1408  Secured By [employer] 06/09/2019 1408  Tube Holder Repositioned Yes 06/09/2019 1408  Cuff Pressure (cm H2O) 24 cm H2O 05/28/2019 1408  Site Condition Dry 05/24/2019 1408     CVC Triple Lumen 06/21/2019 Right Internal jugular (Active)  Indication for Insertion or Continuance of Line Vasoactive infusions 06/22/2019 0800  Site Assessment Clean;Dry;Intact 06/20/2019 0800  Proximal Lumen Status Infusing;Blood return noted 05/27/2019 0800  Medial Lumen Status Infusing;Blood return noted 05/30/2019 0800  Distal Lumen Status Infusing;Blood return noted 06/04/2019 0800  Dressing Type Transparent;Occlusive 06/15/2019 0800  Dressing Status Clean;Dry;Intact 06/05/2019 0800  Dressing Change Due 06/07/19 06/02/2019 0800     Negative Pressure Wound Therapy Hip Left (Active)     NG/OG Tube Orogastric 18 Fr. 50 cm (Active)  Cm Marking at Nare/Corner of Mouth (if applicable) 50 cm 02/54/27 1200  Site Assessment Dry;Intact 06/23/2019 1200  Ongoing Placement Verification No change in cm markings or external length of tube from initial placement 06/15/2019 0400  Status Suction-low intermittent 05/25/2019 1200  Drainage Appearance Bloody 05/30/2019 1200  Output (mL) 0 mL 05/26/2019 0730     Urethral Catheter Claiborne Billings, RN Double-lumen 16 Fr. (Active)  Indication for  Insertion or Continuance of Catheter Unstable critically ill patients first 24-48 hours (See Criteria) 06/11/2019 0800  Site Assessment Clean;Intact 05/30/2019 0800  Catheter Maintenance Bag below level of bladder;Catheter secured;Drainage bag/tubing not touching floor;Insertion date on drainage bag;No dependent loops;Seal intact 05/27/2019 0800  Collection Container Standard drainage bag 06/09/2019 0800  Securement Method Securing device (Describe) 06/05/2019 0800  Urinary Catheter Interventions (if applicable) Unclamped 12/14/74 0800  Output (mL) 15 mL 06/05/2019 1714    Anti-infectives:  Anti-infectives (From admission, onward)   Start     Dose/Rate Route Frequency Ordered Stop   06/07/2019 2130  piperacillin-tazobactam (ZOSYN) IVPB 3.375 g     3.375 g 12.5 mL/hr over 240 Minutes Intravenous Every 8 hours 05/29/2019 2123        Microbiology: Results for orders placed or performed during the hospital encounter of 05/29/2019  SARS CORONAVIRUS 2 (TAT 6-24 HRS) Nasopharyngeal Nasopharyngeal Swab     Status: None   Collection Time: 06/16/2019  2:54 PM   Specimen: Nasopharyngeal Swab  Result Value Ref Range Status   SARS Coronavirus 2 NEGATIVE NEGATIVE Final    Comment: (NOTE) SARS-CoV-2 target nucleic acids are NOT DETECTED. The SARS-CoV-2 RNA is generally detectable in upper and lower respiratory specimens during the acute phase of infection. Negative results do not preclude SARS-CoV-2 infection, do not rule out co-infections with other pathogens, and should not be used as the sole basis for treatment or other patient management decisions. Negative results must be combined with clinical observations, patient history, and epidemiological information. The expected result is Negative. Fact Sheet for Patients: SugarRoll.be Fact Sheet for Healthcare Providers: https://www.woods-mathews.com/ This test is not yet approved or cleared by  the [employer] and   has been authorized for detection and/or diagnosis of SARS-CoV-2 by FDA under an Emergency Use Authorization (EUA). This EUA will remain  in effect (meaning this test can be used) for the duration of the COVID-19 declaration under Section 56 4(b)(1) of the Act, 21 U.S.C. section 360bbb-3(b)(1), unless the authorization is terminated or revoked sooner. Performed at [facility], [place] [address] [place], [place] [address]   CULTURE, BLOOD (ROUTINE X 2) w Reflex to ID Panel     Status: None (Preliminary result)   Collection Time: 06/12/2019  6:18 PM   Specimen: BLOOD  Result Value Ref Range Status   Specimen Description BLOOD BLOOD RIGHT HAND  Final   Special Requests   Final    BOTTLES DRAWN AEROBIC AND ANAEROBIC Blood Culture adequate volume   Culture   Final    NO GROWTH < 12 HOURS Performed at [facility], [address] [place], [place] [address]    Report Status PENDING  Incomplete  Respiratory Panel by RT PCR (Flu A&B, Covid) - Nasopharyngeal Swab     Status: None   Collection Time: 06/20/2019  9:14 PM   Specimen: Nasopharyngeal Swab  Result Value Ref Range Status   SARS Coronavirus 2 by RT PCR NEGATIVE NEGATIVE Final    Comment: (NOTE) SARS-CoV-2 target nucleic acids are NOT DETECTED. The SARS-CoV-2 RNA is generally detectable in upper respiratoy specimens during the acute phase of infection. The lowest concentration of SARS-CoV-2 viral copies this assay can detect is 131 copies/mL. A negative result does not preclude SARS-Cov-2 infection and should not be used as the sole basis for treatment or other patient management decisions. A negative result may occur with  improper specimen collection/handling, submission of specimen other than nasopharyngeal swab, presence of viral mutation(s) within the areas targeted by this assay, and inadequate number of viral copies (<131 copies/mL). A negative result must be combined with clinical observations, patient  history, and epidemiological information. The expected result is Negative. Fact Sheet for Patients:  PinkCheek.be Fact Sheet for Healthcare Providers:  GravelBags.it This test is not yet ap proved or cleared by the [place] FDA and  has been authorized for detection and/or diagnosis of SARS-CoV-2 by FDA under an Emergency Use Authorization (EUA). This EUA will remain  in effect (meaning this test can be used) for the duration of the COVID-19 declaration under Section 564(b)(1) of the Act, 21 U.S.C. section 360bbb-3(b)(1), unless the authorization is terminated or revoked sooner.    Influenza A by PCR NEGATIVE NEGATIVE Final   Influenza B by PCR NEGATIVE NEGATIVE Final    Comment: (NOTE) The Xpert Xpress SARS-CoV-2/FLU/RSV assay is intended as an aid in  the diagnosis of influenza from Nasopharyngeal swab specimens and  should not be used as a sole basis for treatment. Nasal washings and  aspirates are unacceptable for Xpert Xpress SARS-CoV-2/FLU/RSV  testing. Fact Sheet for Patients: PinkCheek.be Fact Sheet for Healthcare Providers: GravelBags.it This test is not yet approved or cleared by the [place] FDA and  has been authorized for detection and/or diagnosis of SARS-CoV-2 by  FDA under an Emergency Use Authorization (EUA). This EUA will remain  in effect (meaning this test can be used) for the duration of the  Covid-19 declaration under Section 564(b)(1) of the Act, 21  U.S.C. section 360bbb-3(b)(1), unless the authorization is  terminated or revoked. Performed at [facility], [address], [place], [place] [address]   MRSA PCR  Screening     Status: None   Collection Time: 06/08/2019 10:35 PM   Specimen: Nasopharyngeal  Result Value Ref Range Status   MRSA by PCR NEGATIVE NEGATIVE Final    Comment:        The GeneXpert MRSA Assay  (FDA approved for NASAL specimens only), is one component of a comprehensive MRSA colonization surveillance program. It is not intended to diagnose MRSA infection nor to guide or monitor treatment for MRSA infections. Performed at [name], [address] [place], [place] [address]   CULTURES: Blood x2 12/8>> Urine 12/8>> Tracheal aspirate 12/8>> SARS-CoV-2 PCR 12/8>> NEGATIVE Influenza PCR 12/8>>NEGATIVE Respiratory Viral Panel 12/8>> NEGATIVE MRSA PCR 12/8>> NEGATIVE  Best Practice/Protocols:  VTE Prophylaxis: Mechanical GI Prophylaxis: Proton Pump Inhibitor  ANTIBIOTICS: Zosyn 12/8>>  Events: 12/8- Presented to ED 12/8- Decompensated in ED requiring intubation and vasopressors 12/8- right IJ CVC 12/9- Transvenous pacer placed, right femoral approach  Studies: Ct Abdomen Pelvis Wo Contrast  Result Date: 06/05/2019 CLINICAL DATA:  Abdominal rigidity EXAM: CT ABDOMEN AND PELVIS WITHOUT CONTRAST TECHNIQUE: Multidetector CT imaging of the abdomen and pelvis was performed following the standard protocol without IV contrast. COMPARISON:  Ultrasound from 04/01/2019 FINDINGS: Lower chest: Small right pleural effusion is noted with bibasilar atelectasis right greater than left. No focal parenchymal nodules are seen. Gastric catheter is noted extending into the stomach. Hepatobiliary: Liver is well visualized and within normal limits. Gallbladder demonstrates some mild wall thickening likely reactive related to the ascites. Increased density is noted within consistent with cholelithiasis and gallbladder sludge similar to that noted on prior ultrasound. Pancreas: Unremarkable. No pancreatic ductal dilatation or surrounding inflammatory changes. Spleen: Normal in size without focal abnormality. Adrenals/Urinary Tract: Adrenal glands are within normal limits. Kidneys are well visualized bilaterally. No renal calculi or obstructive changes are seen. The bladder is decompressed  by Foley catheter and obscured by bilateral hip replacements. Stomach/Bowel: Scattered diverticular change of the colon is noted without evidence of diverticulitis. No obstructive or inflammatory changes are seen. The appendix is within normal limits. Some inflammatory changes are noted surrounding the second portion of the duodenum and head of the pancreas as well as extending into the mesentery inferiorly. This may be related to some focal duodenitis although may be reactive related to the mild ascites. Vascular/Lymphatic: Aortic atherosclerosis. No enlarged abdominal or pelvic lymph nodes. Reproductive: Status post hysterectomy. No adnexal masses. Other: No abdominal wall hernia or abnormality. No abdominopelvic ascites. Musculoskeletal: Bilateral hip replacements are noted. Degenerative changes of lumbar spine are seen. IMPRESSION: Small right pleural effusion with bibasilar atelectasis. Cholelithiasis and gallbladder sludge. Mild ascites. Inflammatory changes surrounding duodenum and head of the pancreas likely related to some duodenitis. No perforation or definitive ulcer crater is seen. These changes may be in part related to the underlying ascites as well. No other focal abnormality is noted. Electronically Signed   By: Inez Catalina M.D.   On: 05/25/2019 22:28   Ct Head Wo Contrast  Result Date: 05/24/2019 CLINICAL DATA:  Encephalopathy EXAM: CT HEAD WITHOUT CONTRAST TECHNIQUE: Contiguous axial images were obtained from the base of the skull through the vertex without intravenous contrast. COMPARISON:  06/17/2019 FINDINGS: Brain: There is no mass, hemorrhage or extra-axial collection. The size and configuration of the ventricles and extra-axial CSF spaces are normal. The brain parenchyma is normal, without acute or chronic infarction. Vascular: No abnormal hyperdensity of the major intracranial arteries or dural venous sinuses. No intracranial atherosclerosis. Skull: The visualized skull base, calvarium  and extracranial soft tissues are normal. Sinuses/Orbits: No fluid levels or advanced mucosal thickening of the visualized paranasal sinuses. No mastoid or middle ear effusion. The orbits are normal. IMPRESSION: Normal head CT. Electronically Signed   By: Ulyses Jarred M.D.   On: 06/17/2019 02:53   Ct Head Wo Contrast  Result Date: 06/15/2019 CLINICAL DATA:  Altered level of consciousness. Confusion. Parkinson's. EXAM: CT HEAD WITHOUT CONTRAST TECHNIQUE: Contiguous axial images were obtained from the base of the skull through the vertex without intravenous contrast. COMPARISON:  April 13, 2019 FINDINGS: Brain: No subdural, epidural, or subarachnoid hemorrhage identified. Cerebellum, brainstem, and basal cisterns are normal. No mass effect or midline shift. No acute cortical ischemia or infarct identified. Vascular: No hyperdense vessel or unexpected calcification. Skull: Normal. Negative for fracture or focal lesion. Sinuses/Orbits: No acute finding. Other: None. IMPRESSION: 1. No acute intracranial abnormalities are identified. 2. Electronically Signed   By: Dorise Bullion III M.D   On: 06/14/2019 14:29   US Renal  Result Date: 06/02/2019 CLINICAL DATA:  AKI EXAM: RENAL / URINARY TRACT ULTRASOUND COMPLETE COMPARISON:  None. FINDINGS: Right Kidney: Renal measurements: 10.4 x 4.5 x 5.6 cm = volume: 136 mL . Echogenicity is increased. No mass or hydronephrosis visualized. Left Kidney: Renal measurements: 10.2 x 5.3 x 4.3 cm = volume: 123 mL. Echogenicity is increased. No mass or hydronephrosis visualized. Bladder: Decompressed by Foley catheter. Other: Cholelithiasis, sludge, mild gallbladder wall thickening. Mild perinephric fluid. Free fluid is present in the pelvis. IMPRESSION: No hydronephrosis. Increased renal echogenicity reflecting medical renal disease. Mild free fluid. Gallbladder stones, sludge, and wall thickening. Electronically Signed   By: Macy Mis M.D.   On: 05/25/2019 11:35   Dg  Chest Port 1 View  Result Date: 06/15/2019 CLINICAL DATA:  Central line placement. EXAM: PORTABLE CHEST 1 VIEW COMPARISON:  Radiograph earlier this day. FINDINGS: Tip of the right internal jugular central venous catheter projects over the upper SVC. No visualized pneumothorax. Endotracheal tube and enteric tube remain in place. Borderline cardiomegaly. Unchanged vascular congestion. No new airspace disease. Streaky bibasilar atelectasis. IMPRESSION: 1. Tip of the right internal jugular central venous catheter projects over the upper SVC. No pneumothorax. 2. Unchanged vascular congestion. 3. Streaky bibasilar atelectasis. Electronically Signed   By: Keith Rake M.D.   On: 06/03/2019 00:19   Dg Chest Portable 1 View  Result Date: 06/21/2019 CLINICAL DATA:  Respiratory distress. Endotracheal tube and OG tube placement. EXAM: PORTABLE CHEST 1 VIEW COMPARISON:  06/11/2019 FINDINGS: Endotracheal tube is noted with tip 2.8 cm above the carina. An OG tube is noted entering the stomach with tip just off of the field of view. This is a mildly low volume film. Cardiomegaly and mild pulmonary vascular congestion noted. No pneumothorax or pleural effusion. Defibrillator/pacing pads overlying the chest noted. No acute bony abnormalities are identified. IMPRESSION: 1. Endotracheal tube with tip 2.8 cm above the carina. 2. OG tube entering the stomach with tip just off of the field of view. 3. Cardiomegaly with mild pulmonary vascular congestion. Electronically Signed   By: Margarette Canada M.D.   On: 06/12/2019 20:51   Dg Chest Portable 1 View  Result Date: 05/29/2019 CLINICAL DATA:  Altered mental status. EXAM: PORTABLE CHEST 1 VIEW COMPARISON:  April 17, 2019. FINDINGS: The heart size and mediastinal contours are within normal limits. Both lungs are clear. The visualized skeletal structures are unremarkable. IMPRESSION: No active disease. Electronically Signed   By: Marijo Conception M.D.   On:  06/23/2019 14:02   Dg  Abd Portable 1 View  Result Date: 06/03/2019 CLINICAL DATA:  OG tube placement. EXAM: PORTABLE ABDOMEN - 1 VIEW COMPARISON:  None. FINDINGS: An OG tube is noted with tip overlying the mid stomach. Bowel gas pattern is unremarkable. IMPRESSION: 1. OG tube with tip overlying the mid stomach. 2. Unremarkable bowel gas pattern. Electronically Signed   By: Margarette Canada M.D.   On: 06/20/2019 20:52    Consults: Treatment Team:  Isaias Cowman, MD Tyler Pita, MD Leotis Pain, MD Murlean Iba, MD Efrain Sella, MD   Subjective:    Overnight Issues: Intubated and mechanically ventilated.  Remains encephalopathic  Objective:  Vital signs for last 24 hours: Temp:  [93.7 F (34.3 C)-100.6 F (38.1 C)] 100.6 F (38.1 C) (12/09 1600) Pulse Rate:  [39-91] 72 (12/09 1700) Resp:  [0-26] 0 (12/09 1700) BP: (67-189)/(36-166) 125/63 (12/09 1700) SpO2:  [68 %-100 %] 99 % (12/09 1700) FiO2 (%):  [50 %-100 %] 60 % (12/09 1600) Weight:  [61.4 kg] 61.4 kg (12/09 0438)  Hemodynamic parameters for last 24 hours: CVP:  [7 mmHg-26 mmHg] 20 mmHg  Intake/Output from previous day: 12/08 0701 - 12/09 0700 In: 1783.4 [I.V.:772; IV Piggyback:1011.4] Out: 160 [Urine:160]  Intake/Output this shift: Total I/O In: 455 [I.V.:3; Blood:452] Out: 15 [Urine:15]  Vent settings for last 24 hours: Vent Mode: PRVC FiO2 (%):  [50 %-100 %] 60 % Set Rate:  [16 bmp] 16 bmp Vt Set:  [400 mL] 400 mL PEEP:  [8 cmH20] 8 cmH20 Plateau Pressure:  [15 cmH20-20 cmH20] 20 cmH20  Physical Exam:  General: Critically ill appearing female, intubated, synchronous with the vent, Neuro:  Unresponsive on no sedation, pupils fixed and dilated (6 mm bilaterally), no corneal reflex, no cough/gag reflex, opens eyes, some nystagmus noted, no tracking HEENT:  Atraumatic, normocephalic, neck supple, no JVD Cardiovascular:  Irregular rhythm, bradycardia Lungs:  Coarse breath sounds throughout, vent  assisted Abdomen:  Soft, nontender, nondistended, no guarding or rebound tenderness, Hypoactive bowel sounds Musculoskeletal:  LUE amputation Skin:  Cool, mottled, no obvious rashes, lesions, or ulcerations  Assessment/Plan:  PULMONARY A: Acute Hypoxic Respiratory Failure, likely secondary to severe metabolic acidosis and pulmonary vascular congestion Hx: Asthma, no exacerbation P:   -Full vent support -Follow intermittent CXR & ABG as needed -Wean FiO2 & PEEP as tolerated -Spontaneous breathing trials when respiratory parameters met -PRN Bronchodilators   CARDIOVASCULAR A:  Hypotension, likely Cardiogenic shock vs.Septic shock vs.Hemorrhagic shock Bradycardia P:  -Continuous cardiac monitoring -Maintain MAP >65 -IV fluids -Levophed as needed to maintain MAP goal -Trend CVP -Cardiology following, appreciate input -Transvenous pacer has helped hemodynamics -Trend troponin -Check BNP -Echocardiogram performed EF is 40 to 45%, normal RV, moderate MR  RENAL A:   AKI likely ATN Anion gap Metabolic acidosis Rhabdomyolysis Hyperkalemia>>resolved Hyponatremia>>resolved P:   -Monitor I&O's / urinary output -Follow BMP -Ensure adequate renal perfusion -Avoid nephrotoxic agents as able, pharmacy consultation for dosing of medications -Replace electrolytes as indicated -Nephrology consulted, appreciate input -IV fluids -Follow serum CK -Renal US: No hydronephrosis, medical renal disease -Bicarb gtt, discontinued today   GASTROINTESTINAL A:   GI Bleed Cholelithiasis w/ Gallbladder sludge CT Abdomen & Pelvis w/ Duodenitis Elevated LFT's, likely shock liver Hyperammonemia P:   -NPO -Protonix gtt -GI consulted, appreciate input, discussed with Dr. Alice Reichert, check acetaminophen level, empiric NAC -Transfuse as indicated -Consider General Surgery consult -Trend LFT's & Ammonia -Check Hepatitis panel  HEMATOLOGIC A:   Acute blood loss  anemia Supratherapeutic  INR, likely in setting of liver failure vs. DIC P:  -Monitor for S/Sx of bleeding -Trend CBC -SCD's for VTE Prophylaxis (no chemical prophylaxis given GI bleed) -Transfuse for Hgb <8 -Trend bleeding times -Check DIC panel -Consider FFP & Vitamin K   INFECTIOUS A:   Leukocytosis Meets SIRS Criteria, CT Abdomen with Duodenitis P:   -Monitor fever curve -Trend WBC's & Procalcitonin -Continue empiric Zosyn -Follow pan cultures as above -CXR without infiltrate -CT Abdomen with Cholelithiasis and gallbladder slude, and with Duodenitis (no perforation noted)   ENDOCRINE A:   Hypoglycemia   P:   -CBG's -Follow ICU Hypo/hyperglycemia protocol -D5 infusion  NEUROLOGIC A:   Altered mental status, now unresponsive without corneal & gag/cough reflexes; concern for anoxic injury Hx: Parkinson's, Bipolar disorder P:   -RASS goal: 0 to -1 -Fentanyl pushes as needed to maintain RASS goal -Daily wake up assessments -CT head negative 12/8 -Will repeat CT Head due to poor Neuro exam upon arrival to ICU>> repeat CT Head normal -Obtained EEG: No seizures, diffuse encephalopathy, nonspecific.  Excessive beta activity consistent with benzodiazepine (currently patient not on benzodiazepines or other sedatives) -Consult Neurology, appreciate input -Urine drug screen positive for benzodiazepines    LOS: 1 day   Additional comments: Had lengthy discussion with the patient's son and POA Park Meo.  Advised Mr. Passmore that the patient's prognosis is guarded at best.  Continue full code for now however there are [place] is considering CODE STATUS at present.  Critical Care Total Time*: 45 Minutes  C. Derrill Kay, MD [facility] 06/20/2019  *Care during the described time interval was provided by me and/or other providers on the critical care team.  I have reviewed this patient's available data, including medical history, events of note, physical examination and test results  as part of my evaluation.  **This note was dictated using voice recognition software/Dragon.  Despite best efforts to proofread, errors can occur which can change the meaning.  Any change was purely unintentional.

## 2019-06-01 NOTE — Progress Notes (Signed)
*  PRELIMINARY RESULTS* Echocardiogram 2D Echocardiogram has been performed.  [name] 06/04/2019, 9:26 AM

## 2019-06-01 NOTE — Progress Notes (Signed)
[facility] Cardiology  SUBJECTIVE: Patient intubated   Vitals:   06/09/2019 0900 06/12/2019 0913 06/04/2019 0915 06/09/2019 0930  BP: 119/63  115/76 116/69  Pulse: 69 69 70 69  Resp: 16 13 16 16   Temp: (!) 97.3 F (36.3 C) (!) 97.5 F (36.4 C) (!) 97.5 F (36.4 C) 97.9 F (36.6 C)  TempSrc: Bladder Bladder    SpO2: 95% 94%  100%  Weight:      Height:         Intake/Output Summary (Last 24 hours) at 06/19/2019 1018 Last data filed at 06/14/2019 0900 Gross per 24 hour  Intake 2012.37 ml  Output 160 ml  Net 1852.37 ml      PHYSICAL EXAM  General: Acutely ill HEENT: Intubated Neck:  No JVD.  Lungs: Clear bilaterally to auscultation and percussion. Heart: HRRR . Normal S1 and S2 without gallops or murmurs.  Abdomen: Bowel sounds are positive, abdomen soft and non-tender  Msk:  Back normal, normal gait. Normal strength and tone for age. Extremities: No clubbing, cyanosis or edema.   Neuro: Sedated Psych: Sedated   LABS: Basic Metabolic Panel: Recent Labs    05/27/2019 0001 06/03/2019 0429  NA 137 139  K 4.8 4.0  CL 102 104  CO2 15* 18*  GLUCOSE 224* 291*  BUN 62* 60*  CREATININE 2.02* 1.90*  CALCIUM 7.4* 6.8*   Liver Function Tests: Recent Labs    05/29/2019 2049 06/18/2019 0429  AST 825* 3,267*  ALT 24 87*  ALKPHOS 111 90  BILITOT 3.4* 5.2*  PROT 5.3* 4.8*  ALBUMIN 3.0* 2.8*   Recent Labs    06/14/2019 1228  LIPASE 18   CBC: Recent Labs    06/02/2019 1228 05/30/2019 2049 06/17/2019 0001 05/24/2019 0429  WBC 16.0* 23.1*  --  27.4*  NEUTROABS 12.4* 18.3*  --   --   HGB 11.6* 11.2* 13.9 14.3  HCT 36.4 37.3 44.6 45.0  MCV 91.2 98.9  --  91.1  PLT 381 244  --  158   Cardiac Enzymes: Recent Labs    06/21/2019 2049 06/20/2019 0429  CKTOTAL 3,445* 3,496*   BNP: Invalid input(s): POCBNP D-Dimer: No results for input(s): DDIMER in the last 72 hours. Hemoglobin A1C: No results for input(s): HGBA1C in the last 72 hours. Fasting Lipid Panel: No results for input(s):  CHOL, HDL, LDLCALC, TRIG, CHOLHDL, LDLDIRECT in the last 72 hours. Thyroid Function Tests: Recent Labs    06/23/2019 0429  TSH 2.738   Anemia Panel: No results for input(s): VITAMINB12, FOLATE, FERRITIN, TIBC, IRON, RETICCTPCT in the last 72 hours.  Ct Abdomen Pelvis Wo Contrast  Result Date: 06/09/2019 CLINICAL DATA:  Abdominal rigidity EXAM: CT ABDOMEN AND PELVIS WITHOUT CONTRAST TECHNIQUE: Multidetector CT imaging of the abdomen and pelvis was performed following the standard protocol without IV contrast. COMPARISON:  Ultrasound from 04/01/2019 FINDINGS: Lower chest: Small right pleural effusion is noted with bibasilar atelectasis right greater than left. No focal parenchymal nodules are seen. Gastric catheter is noted extending into the stomach. Hepatobiliary: Liver is well visualized and within normal limits. Gallbladder demonstrates some mild wall thickening likely reactive related to the ascites. Increased density is noted within consistent with cholelithiasis and gallbladder sludge similar to that noted on prior ultrasound. Pancreas: Unremarkable. No pancreatic ductal dilatation or surrounding inflammatory changes. Spleen: Normal in size without focal abnormality. Adrenals/Urinary Tract: Adrenal glands are within normal limits. Kidneys are well visualized bilaterally. No renal calculi or obstructive changes are seen. The bladder is decompressed by  Foley catheter and obscured by bilateral hip replacements. Stomach/Bowel: Scattered diverticular change of the colon is noted without evidence of diverticulitis. No obstructive or inflammatory changes are seen. The appendix is within normal limits. Some inflammatory changes are noted surrounding the second portion of the duodenum and head of the pancreas as well as extending into the mesentery inferiorly. This may be related to some focal duodenitis although may be reactive related to the mild ascites. Vascular/Lymphatic: Aortic atherosclerosis. No  enlarged abdominal or pelvic lymph nodes. Reproductive: Status post hysterectomy. No adnexal masses. Other: No abdominal wall hernia or abnormality. No abdominopelvic ascites. Musculoskeletal: Bilateral hip replacements are noted. Degenerative changes of lumbar spine are seen. IMPRESSION: Small right pleural effusion with bibasilar atelectasis. Cholelithiasis and gallbladder sludge. Mild ascites. Inflammatory changes surrounding duodenum and head of the pancreas likely related to some duodenitis. No perforation or definitive ulcer crater is seen. These changes may be in part related to the underlying ascites as well. No other focal abnormality is noted. Electronically Signed   By: Inez Catalina M.D.   On: 06/04/2019 22:28   Ct Head Wo Contrast  Result Date: 05/24/2019 CLINICAL DATA:  Encephalopathy EXAM: CT HEAD WITHOUT CONTRAST TECHNIQUE: Contiguous axial images were obtained from the base of the skull through the vertex without intravenous contrast. COMPARISON:  06/07/2019 FINDINGS: Brain: There is no mass, hemorrhage or extra-axial collection. The size and configuration of the ventricles and extra-axial CSF spaces are normal. The brain parenchyma is normal, without acute or chronic infarction. Vascular: No abnormal hyperdensity of the major intracranial arteries or dural venous sinuses. No intracranial atherosclerosis. Skull: The visualized skull base, calvarium and extracranial soft tissues are normal. Sinuses/Orbits: No fluid levels or advanced mucosal thickening of the visualized paranasal sinuses. No mastoid or middle ear effusion. The orbits are normal. IMPRESSION: Normal head CT. Electronically Signed   By: Ulyses Jarred M.D.   On: 06/15/2019 02:53   Ct Head Wo Contrast  Result Date: 05/27/2019 CLINICAL DATA:  Altered level of consciousness. Confusion. Parkinson's. EXAM: CT HEAD WITHOUT CONTRAST TECHNIQUE: Contiguous axial images were obtained from the base of the skull through the vertex without  intravenous contrast. COMPARISON:  April 13, 2019 FINDINGS: Brain: No subdural, epidural, or subarachnoid hemorrhage identified. Cerebellum, brainstem, and basal cisterns are normal. No mass effect or midline shift. No acute cortical ischemia or infarct identified. Vascular: No hyperdense vessel or unexpected calcification. Skull: Normal. Negative for fracture or focal lesion. Sinuses/Orbits: No acute finding. Other: None. IMPRESSION: 1. No acute intracranial abnormalities are identified. 2. Electronically Signed   By: Dorise Bullion III M.D   On: 06/07/2019 14:29   Dg Chest Port 1 View  Result Date: 06/05/2019 CLINICAL DATA:  Central line placement. EXAM: PORTABLE CHEST 1 VIEW COMPARISON:  Radiograph earlier this day. FINDINGS: Tip of the right internal jugular central venous catheter projects over the upper SVC. No visualized pneumothorax. Endotracheal tube and enteric tube remain in place. Borderline cardiomegaly. Unchanged vascular congestion. No new airspace disease. Streaky bibasilar atelectasis. IMPRESSION: 1. Tip of the right internal jugular central venous catheter projects over the upper SVC. No pneumothorax. 2. Unchanged vascular congestion. 3. Streaky bibasilar atelectasis. Electronically Signed   By: Keith Rake M.D.   On: 06/18/2019 00:19   Dg Chest Portable 1 View  Result Date: 06/16/2019 CLINICAL DATA:  Respiratory distress. Endotracheal tube and OG tube placement. EXAM: PORTABLE CHEST 1 VIEW COMPARISON:  06/12/2019 FINDINGS: Endotracheal tube is noted with tip 2.8 cm above the carina. An OG  tube is noted entering the stomach with tip just off of the field of view. This is a mildly low volume film. Cardiomegaly and mild pulmonary vascular congestion noted. No pneumothorax or pleural effusion. Defibrillator/pacing pads overlying the chest noted. No acute bony abnormalities are identified. IMPRESSION: 1. Endotracheal tube with tip 2.8 cm above the carina. 2. OG tube entering the stomach  with tip just off of the field of view. 3. Cardiomegaly with mild pulmonary vascular congestion. Electronically Signed   By: Margarette Canada M.D.   On: 06/05/2019 20:51   Dg Chest Portable 1 View  Result Date: 05/28/2019 CLINICAL DATA:  Altered mental status. EXAM: PORTABLE CHEST 1 VIEW COMPARISON:  April 17, 2019. FINDINGS: The heart size and mediastinal contours are within normal limits. Both lungs are clear. The visualized skeletal structures are unremarkable. IMPRESSION: No active disease. Electronically Signed   By: Marijo Conception M.D.   On: 06/13/2019 14:02   Dg Abd Portable 1 View  Result Date: 05/26/2019 CLINICAL DATA:  OG tube placement. EXAM: PORTABLE ABDOMEN - 1 VIEW COMPARISON:  None. FINDINGS: An OG tube is noted with tip overlying the mid stomach. Bowel gas pattern is unremarkable. IMPRESSION: 1. OG tube with tip overlying the mid stomach. 2. Unremarkable bowel gas pattern. Electronically Signed   By: Margarette Canada M.D.   On: 06/05/2019 20:52     Echo pending  TELEMETRY: Reticular paced rhythm at 70 bpm:  ASSESSMENT AND PLAN:  Active Problems:   Altered level of consciousness    1.  Bradycardia, of uncertain clinical significance, status post temporary transvenous pacemaker via right femoral vein, pacing appropriately at 70 bpm, with underlying atrial fibrillation 50 to 60 bpm 2.  Paroxysmal atrial fibrillation, chads vas score of 2, with history of Parkinson's, EtOH abuse, poor medical compliance, poor candidate for chronic anticoagulation 3.  Acute renal failure, likely secondary to dehydration 4.  Altered mental status, likely multifactorial, with underlying history of Parkinson's, EtOH abuse, poor medical compliance, poor general health 5.  Borderline elevated troponin (201, 214), likely demand supply ischemia, not due to acute coronary syndrome 6.  Rhabdomyolysis, with elevated total CK 3496 7.  Liver failure, AST 3267  Recommendations  1.  Agree with overall current  therapy 2.  Continue transvenous ventricular pacing for now 3.  Review 2D echocardiogram 4.  Defer chronic anticoagulation 5.  Defer permanent pacemaker implantation at this time   Isaias Cowman, MD, PhD, [facility] 06/16/2019 10:18 AM

## 2019-06-01 NOTE — Consult Note (Addendum)
[facility] GI Inpatient Consult Note   [name], M.D.  Reason for Consult: Acute upper GI bleed   Attending Requesting Consult: Darel Hong, NP   History of Present Illness: [name] is a 63 y.o. female with a history of alcoholism and parkinson's disease presenting for Altered mental status, respiratory failure, leukocytosis and acute renal failure. During intubation and OG suction, patient was noted to have "frank" blood in the OG tube. Patient placed on vasopressors and is still on mechanical ventilation without overt hemorrhaging overnight.  Patient currently obtunded, sedated on mechanical ventilatory support.   Patient underwent colonoscopy on 11/13/2017 (Two adenomatous polyps),  EGD in November 2019; I performed both procedures. EGD was unremarkable.   Patient has hx of excessive alcohol intake. She did not follow up in my office and was therefore lost to our practice until this time. She has tylenol listed as a qid medication as needed, up to 4g daily.   Past Medical History:  Past Medical History:  Diagnosis Date  . Anxiety   . Anxiety    panic anxiety disorder  . Arthritis   . Asthma    chronic asthmatic bronchitis  . Bipolar disorder ([place])   . Cancer [facility])    Desmoid tumor left forearm  . Depression   . ETOH abuse   . Heart murmur   . Insomnia   . Neuropathy     Problem List: Patient Active Problem List   Diagnosis Date Noted  . Altered level of consciousness 06/05/2019  . AKI (acute kidney injury) ([place]) 04/13/2019  . Altered mental status   . ARF (acute renal failure) ([place]) 03/31/2019  . Back pain 03/16/2019  . Constipation 03/16/2019  . Parkinson disease ([place]) 03/16/2019  . Murmur 03/16/2019  . Neck pain 03/16/2019  . 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 Essential hypertension 02/22/2019  . At high risk for falls 01/25/2019  . Drug-induced tremor 01/25/2019  . Essential tremor 01/25/2019  . Multifactorial gait disorder  01/25/2019  . 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 Displacement of internal left hip prosthesis ([place]) 11/29/2018  . Hip dislocation, left ([place]) 11/29/2018  . Hip dislocation, left, sequela 08/06/2018  . High anion gap metabolic acidosis 99991111  . History of alcohol abuse 10/09/2016  . Neuropathy of left upper extremity 10/09/2016  . Urinary incontinence, mixed 10/09/2016  . Overdose of antidepressant 07/03/2016  . 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 Alcohol use disorder, severe, dependence ([place]) 01/26/2015  . Tobacco use disorder 01/26/2015  . Nicotine dependence, uncomplicated [id number]  . Alcohol withdrawal ([place]) 01/08/2015  . 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 Suicidal ideation 12/19/2014  . Alcohol use 10/30/2014  . Anxiety 10/30/2014  . Depression 10/30/2014  . GAD (generalized anxiety disorder) 12/24/2013  . PTSD (post-traumatic stress disorder) 12/22/2013    Past Surgical History: Past Surgical History:  Procedure Laterality Date  . ABDOMINAL HYSTERECTOMY    . ABDOMINAL HYSTERECTOMY  2007  . 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 ARM AMPUTATION AT SHOULDER Left   . CESAREAN SECTION    . [place]   x 2  . COLONOSCOPY    . ESOPHAGOGASTRODUODENOSCOPY (EGD) WITH PROPOFOL N/A 04/27/2018   Procedure: ESOPHAGOGASTRODUODENOSCOPY (EGD) WITH PROPOFOL;  Surgeon: , Benay Pike, MD;  Location: [facility] ENDOSCOPY;  Service: Gastroenterology;  Laterality: N/A;  . INCISION AND DRAINAGE Left  04/13/2019   Procedure: INCISION AND DRAINAGE;  Surgeon: Dereck Leep, MD;  Location: [facility] ORS;  Service: Orthopedics;  Laterality: Left;  . JOINT REPLACEMENT Right 2012   hip  . LUMBAR LAMINECTOMY/DECOMPRESSION MICRODISCECTOMY Left 04/16/2016   Procedure: LUMBAR LAMINECTOMY/DECOMPRESSION MICRODISCECTOMY 1 LEVEL;  Surgeon: Meade Maw, MD;  Location: [facility] ORS;  Service:  Neurosurgery;  Laterality: Left;  Left L4-5 far lateral discectomy, left L4-5 laminoforaminotomy  . TEMPORARY PACEMAKER N/A 06/16/2019   Procedure: TEMPORARY PACEMAKER;  Surgeon: Isaias Cowman, MD;  Location: [place] CV LAB;  Service: Cardiovascular;  Laterality: N/A;  . TOTAL HIP ARTHROPLASTY Left 09/17/2015   Procedure: TOTAL HIP ARTHROPLASTY;  Surgeon: Dereck Leep, MD;  Location: [facility] ORS;  Service: Orthopedics;  Laterality: Left;  . TOTAL HIP REVISION Left 03/16/2019   Procedure: TOTAL HIP REVISION ARTHROPLASTY WITH CONVERSION TO A CONSTRAINED LINER.;  Surgeon: Dereck Leep, MD;  Location: [facility] ORS;  Service: Orthopedics;  Laterality: Left;  . WISDOM TOOTH EXTRACTION      Allergies: Allergies  Allergen Reactions  . Benadryl [Diphenhydramine] Hives and Other (See Comments)    Reaction:  Hyperactivity   . Benadryl [Diphenhydramine] Other (See Comments)    My jaw locked and I could not speak, like a stroke.  . Cephalosporins Hives  . Codeine Nausea And Vomiting    Home Medications: Medications Prior to Admission  Medication Sig Dispense Refill Last Dose  . acetaminophen (TYLENOL) 500 MG tablet Take 1,000 mg by mouth every 6 (six) hours as needed for mild pain or moderate pain.   Unknown at PRN  . amantadine (SYMMETREL) 100 MG capsule Take 200 mg by mouth 2 (two) times daily.   Unknown at Unknown  . benztropine (COGENTIN) 1 MG tablet Take 1 mg by mouth at bedtime.   Unknown at Unknown  . calcium carbonate (OSCAL) 1500 (600 Ca) MG TABS tablet Take 1,500 mg by mouth daily.   Unknown at Unknown  . carbidopa-levodopa (SINEMET IR) 25-100 MG tablet Take 2 tablets by mouth 3 (three) times daily.   Unknown at Unknown  . celecoxib (CELEBREX) 200 MG capsule Take 200 mg by mouth 2 (two) times daily.   Unknown at Unknown  . gabapentin (NEURONTIN) 300 MG capsule Take 900 mg by mouth 3 (three) times daily.   Unknown at Unknown  . LORazepam (ATIVAN) 0.5 MG tablet Take 0.5 mg by mouth  daily as needed for anxiety.   Unknown at PRN  . propranolol (INDERAL) 20 MG tablet Take 20-30 mg by mouth See admin instructions. Take 1 tablets (30mg ) by mouth every morning, 1 tablet (20mg ) by mouth at lunchtime and take 1 tablets (30mg ) by mouth every night   Unknown at Unknown  . traZODone (DESYREL) 150 MG tablet Take 300 mg by mouth at bedtime.   Unknown at Unknown  . venlafaxine XR (EFFEXOR-XR) 150 MG 24 hr capsule Take 300 mg by mouth daily with breakfast.   Unknown at Unknown   Home medication reconciliation was completed with the patient.   Scheduled Inpatient Medications:   . sodium chloride   Intravenous Once  . sodium chloride   Intravenous Once  . chlordiazePOXIDE  25 mg Oral TID  . Chlorhexidine Gluconate Cloth  6 each Topical Daily  . ipratropium-albuterol  3 mL Nebulization Q6H  . lactulose  300 mL Rectal Daily  . LORazepam  1 mg Intravenous Once  . [START ON 06/04/2019] pantoprazole  40 mg Intravenous Q12H  . sodium chloride flush  3 mL  Intravenous Q12H  . venlafaxine XR  300 mg Oral Q breakfast    Continuous Inpatient Infusions:   . sodium chloride 75 mL/hr at 06/09/2019 0536  . dexmedetomidine (PRECEDEX) IV infusion Stopped (06/07/2019 2258)  . norepinephrine (LEVOPHED) Adult infusion Stopped (06/13/2019 0045)  . pantoprazole (PROTONIX) IVPB    . pantoprozole (PROTONIX) infusion 8 mg/hr (06/21/2019 1130)  . piperacillin-tazobactam (ZOSYN)  IV 3.375 g (06/15/2019 0759)  . sodium bicarbonate 150 mEq in dextrose 5% 1000 mL 150 mEq (06/23/2019 1030)    PRN Inpatient Medications:  acetaminophen **OR** acetaminophen, albuterol, atropine, fentaNYL (SUBLIMAZE) injection, fentaNYL (SUBLIMAZE) injection, ondansetron **OR** ondansetron (ZOFRAN) IV, senna-docusate  Family History: family history includes COPD in her mother.   GI Family History: Unknown  Social History:   reports that she has been smoking cigarettes. She has been smoking about 0.25 packs per day. She has never  used smokeless tobacco. She reports current alcohol use. She reports that she does not use drugs. The patient denies ETOH, tobacco, or drug use.    Review of Systems: Review of Systems - unobtainable  Physical Examination: BP 91/65 (BP Location: Right Arm)   Pulse 72   Temp 100 F (37.8 C) (Bladder)   Resp 17   Ht 5\' 5"  (1.651 m)   Wt 61.4 kg   SpO2 97%   BMI 22.53 kg/m  Physical Exam Constitutional:      Appearance: She is ill-appearing and toxic-appearing.     Interventions: She is sedated and intubated.  HENT:     Head: Normocephalic and atraumatic. No Battle's sign, right periorbital erythema, left periorbital erythema or laceration.  Eyes:     General: No scleral icterus.       Right eye: No foreign body.        Left eye: Foreign body present. Cardiovascular:     Rate and Rhythm: Tachycardia present.     Chest Wall: PMI is not displaced.     Pulses: Decreased pulses.     Heart sounds: No murmur.  Pulmonary:     Effort: She is intubated.     Breath sounds: Rhonchi and rales present. No wheezing.  Abdominal:     General: Bowel sounds are normal. There is distension.     Palpations: Abdomen is soft. There is no shifting dullness or mass.     Hernia: No hernia is present.  Musculoskeletal:     Right shoulder: She exhibits no tenderness, no deformity and no laceration.  Feet:     Right foot:     Skin integrity: No ulcer, blister or skin breakdown.  Lymphadenopathy:     Head:     Left side of head: No submental or preauricular adenopathy.     Cervical: No cervical adenopathy.     Data: Lab Results  Component Value Date   WBC 27.4 (H) 06/08/2019   HGB 14.3 05/30/2019   HCT 45.0 05/25/2019   MCV 91.1 06/23/2019   PLT 158 06/05/2019   Recent Labs  Lab 06/14/2019 2049 06/04/2019 0001 05/30/2019 0429  HGB 11.2* 13.9 14.3   Lab Results  Component Value Date   NA 139 06/16/2019   K 4.0 05/29/2019   CL 104 05/29/2019   CO2 18 (L) 06/11/2019   BUN 60 (H)  06/14/2019   CREATININE 1.90 (H) 06/03/2019   Lab Results  Component Value Date   ALT 87 (H) 05/24/2019   AST 3,267 (H) 06/23/2019   ALKPHOS 90 06/15/2019   BILITOT 5.2 (H) 06/11/2019  Recent Labs  Lab 05/24/2019 0429  APTT 66*  INR 6.5*   CBC Latest Ref Rng & Units 06/14/2019 05/25/2019 06/14/2019  WBC 4.0 - 10.5 K/uL 27.4(H) - 23.1(H)  Hemoglobin 12.0 - 15.0 g/dL 14.3 13.9 11.2(L)  Hematocrit 36.0 - 46.0 % 45.0 44.6 37.3  Platelets 150 - 400 K/uL 158 - 244    STUDIES: Ct Abdomen Pelvis Wo Contrast  Result Date: 06/07/2019 CLINICAL DATA:  Abdominal rigidity EXAM: CT ABDOMEN AND PELVIS WITHOUT CONTRAST TECHNIQUE: Multidetector CT imaging of the abdomen and pelvis was performed following the standard protocol without IV contrast. COMPARISON:  Ultrasound from 04/01/2019 FINDINGS: Lower chest: Small right pleural effusion is noted with bibasilar atelectasis right greater than left. No focal parenchymal nodules are seen. Gastric catheter is noted extending into the stomach. Hepatobiliary: Liver is well visualized and within normal limits. Gallbladder demonstrates some mild wall thickening likely reactive related to the ascites. Increased density is noted within consistent with cholelithiasis and gallbladder sludge similar to that noted on prior ultrasound. Pancreas: Unremarkable. No pancreatic ductal dilatation or surrounding inflammatory changes. Spleen: Normal in size without focal abnormality. Adrenals/Urinary Tract: Adrenal glands are within normal limits. Kidneys are well visualized bilaterally. No renal calculi or obstructive changes are seen. The bladder is decompressed by Foley catheter and obscured by bilateral hip replacements. Stomach/Bowel: Scattered diverticular change of the colon is noted without evidence of diverticulitis. No obstructive or inflammatory changes are seen. The appendix is within normal limits. Some inflammatory changes are noted surrounding the second portion of the  duodenum and head of the pancreas as well as extending into the mesentery inferiorly. This may be related to some focal duodenitis although may be reactive related to the mild ascites. Vascular/Lymphatic: Aortic atherosclerosis. No enlarged abdominal or pelvic lymph nodes. Reproductive: Status post hysterectomy. No adnexal masses. Other: No abdominal wall hernia or abnormality. No abdominopelvic ascites. Musculoskeletal: Bilateral hip replacements are noted. Degenerative changes of lumbar spine are seen. IMPRESSION: Small right pleural effusion with bibasilar atelectasis. Cholelithiasis and gallbladder sludge. Mild ascites. Inflammatory changes surrounding duodenum and head of the pancreas likely related to some duodenitis. No perforation or definitive ulcer crater is seen. These changes may be in part related to the underlying ascites as well. No other focal abnormality is noted. Electronically Signed   By: Inez Catalina M.D.   On: 06/12/2019 22:28   Ct Head Wo Contrast  Result Date: 06/12/2019 CLINICAL DATA:  Encephalopathy EXAM: CT HEAD WITHOUT CONTRAST TECHNIQUE: Contiguous axial images were obtained from the base of the skull through the vertex without intravenous contrast. COMPARISON:  05/25/2019 FINDINGS: Brain: There is no mass, hemorrhage or extra-axial collection. The size and configuration of the ventricles and extra-axial CSF spaces are normal. The brain parenchyma is normal, without acute or chronic infarction. Vascular: No abnormal hyperdensity of the major intracranial arteries or dural venous sinuses. No intracranial atherosclerosis. Skull: The visualized skull base, calvarium and extracranial soft tissues are normal. Sinuses/Orbits: No fluid levels or advanced mucosal thickening of the visualized paranasal sinuses. No mastoid or middle ear effusion. The orbits are normal. IMPRESSION: Normal head CT. Electronically Signed   By: Ulyses Jarred M.D.   On: 06/11/2019 02:53   Ct Head Wo  Contrast  Result Date: 06/20/2019 CLINICAL DATA:  Altered level of consciousness. Confusion. Parkinson's. EXAM: CT HEAD WITHOUT CONTRAST TECHNIQUE: Contiguous axial images were obtained from the base of the skull through the vertex without intravenous contrast. COMPARISON:  April 13, 2019 FINDINGS: Brain: No subdural, epidural,  or subarachnoid hemorrhage identified. Cerebellum, brainstem, and basal cisterns are normal. No mass effect or midline shift. No acute cortical ischemia or infarct identified. Vascular: No hyperdense vessel or unexpected calcification. Skull: Normal. Negative for fracture or focal lesion. Sinuses/Orbits: No acute finding. Other: None. IMPRESSION: 1. No acute intracranial abnormalities are identified. 2. Electronically Signed   By: Dorise Bullion III M.D   On: 06/03/2019 14:29   US Renal  Result Date: 06/21/2019 CLINICAL DATA:  AKI EXAM: RENAL / URINARY TRACT ULTRASOUND COMPLETE COMPARISON:  None. FINDINGS: Right Kidney: Renal measurements: 10.4 x 4.5 x 5.6 cm = volume: 136 mL . Echogenicity is increased. No mass or hydronephrosis visualized. Left Kidney: Renal measurements: 10.2 x 5.3 x 4.3 cm = volume: 123 mL. Echogenicity is increased. No mass or hydronephrosis visualized. Bladder: Decompressed by Foley catheter. Other: Cholelithiasis, sludge, mild gallbladder wall thickening. Mild perinephric fluid. Free fluid is present in the pelvis. IMPRESSION: No hydronephrosis. Increased renal echogenicity reflecting medical renal disease. Mild free fluid. Gallbladder stones, sludge, and wall thickening. Electronically Signed   By: Macy Mis M.D.   On: 06/11/2019 11:35   Dg Chest Port 1 View  Result Date: 05/31/2019 CLINICAL DATA:  Central line placement. EXAM: PORTABLE CHEST 1 VIEW COMPARISON:  Radiograph earlier this day. FINDINGS: Tip of the right internal jugular central venous catheter projects over the upper SVC. No visualized pneumothorax. Endotracheal tube and enteric tube  remain in place. Borderline cardiomegaly. Unchanged vascular congestion. No new airspace disease. Streaky bibasilar atelectasis. IMPRESSION: 1. Tip of the right internal jugular central venous catheter projects over the upper SVC. No pneumothorax. 2. Unchanged vascular congestion. 3. Streaky bibasilar atelectasis. Electronically Signed   By: Keith Rake M.D.   On: 06/01/2019 00:19   Dg Chest Portable 1 View  Result Date: 06/19/2019 CLINICAL DATA:  Respiratory distress. Endotracheal tube and OG tube placement. EXAM: PORTABLE CHEST 1 VIEW COMPARISON:  06/18/2019 FINDINGS: Endotracheal tube is noted with tip 2.8 cm above the carina. An OG tube is noted entering the stomach with tip just off of the field of view. This is a mildly low volume film. Cardiomegaly and mild pulmonary vascular congestion noted. No pneumothorax or pleural effusion. Defibrillator/pacing pads overlying the chest noted. No acute bony abnormalities are identified. IMPRESSION: 1. Endotracheal tube with tip 2.8 cm above the carina. 2. OG tube entering the stomach with tip just off of the field of view. 3. Cardiomegaly with mild pulmonary vascular congestion. Electronically Signed   By: Margarette Canada M.D.   On: 06/02/2019 20:51   Dg Chest Portable 1 View  Result Date: 05/28/2019 CLINICAL DATA:  Altered mental status. EXAM: PORTABLE CHEST 1 VIEW COMPARISON:  April 17, 2019. FINDINGS: The heart size and mediastinal contours are within normal limits. Both lungs are clear. The visualized skeletal structures are unremarkable. IMPRESSION: No active disease. Electronically Signed   By: Marijo Conception M.D.   On: 06/14/2019 14:02   Dg Abd Portable 1 View  Result Date: 06/19/2019 CLINICAL DATA:  OG tube placement. EXAM: PORTABLE ABDOMEN - 1 VIEW COMPARISON:  None. FINDINGS: An OG tube is noted with tip overlying the mid stomach. Bowel gas pattern is unremarkable. IMPRESSION: 1. OG tube with tip overlying the mid stomach. 2. Unremarkable bowel  gas pattern. Electronically Signed   By: Margarette Canada M.D.   On: 06/18/2019 20:52   @IMAGES @  Assessment:  1. Severe coagulopathy - Possibly secondary hepatic failure.  2. Hypertransaminasemia - Consider toxic exposure (Alcohol + Tylenol),  cardiogenic etiology with shock liver, possible sepsis syndrome.  3. Respiratory failure - On mechanical ventilation.  4. Lactic acidosis.  5. AKI with oliguria  6.  Hemodynamically insignificant gastrointestinal bleed, upper.  Possible Mallory-Weiss tear versus peptic ulcer disease.  Patient has no recent history of esophageal varices on last endoscopy in 2019.  COVID-19 status: Negative   Recommendations: 1. Case discussed with Dr. Patsey Berthold. 2. I am concerned about significant hepatoxicity given patient's listed regular ingestion of tylenol, upwards of 4 grams daily. Alcoholism may deplete cytochrome P450 levels in the liver, opening the door to hepatic decompensation with even moderate, regular ingestion of tylenol. This process may have been cumulative over the past several weeks or months, and is not necessarily going to respond as well as acute ingestion of tylenol, say, a suicide attempt. 3. Tylenol level has been ordered STAT. Closely follow LFTs, PT/INR 4. After some discussion, it has been decided that we will begin Mucomyst. Dr. Patsey Berthold has concurred and I will continue to follow and try to help where I can.  5.  Continue acid suppression and will consider upper endoscopy depending upon patient clinical course  Thank you for the consult. Please call with questions or concerns.  Olean Ree, "Lanny Hurst MD [name] [place], [place] [phone]  06/20/2019 12:46 PM

## 2019-06-01 NOTE — Progress Notes (Signed)
SLP Cancellation Note  Patient Details Name: [name] MRN: [id number] DOB: 1955/06/27   Cancelled treatment:       Reason Eval/Treat Not Completed: Medical issues which prohibited therapy;Patient not medically ready(chart reviewed; consulted NSG re: pt's status). Pt is currently sedated on full vent support, orally intubated secondary to decline in medical status. Placement of OG tube revealed frank blood; pt also hypotensive.  ST services will sign off at this time d/t inappropriateness for BSE. Recommend frequent oral care for hygiene. MD to reconsult ST services when appropriate. NSG consulted, agreed.    Orinda Kenner, MS, CCC-SLP Dontay Harm 06/09/2019, 1:27 PM

## 2019-06-02 ENCOUNTER — Inpatient Hospital Stay: Payer: Medicare Other

## 2019-06-02 DIAGNOSIS — K7200 Acute and subacute hepatic failure without coma: Secondary | ICD-10-CM

## 2019-06-02 DIAGNOSIS — R570 Cardiogenic shock: Secondary | ICD-10-CM | POA: Diagnosis not present

## 2019-06-02 DIAGNOSIS — R404 Transient alteration of awareness: Secondary | ICD-10-CM | POA: Diagnosis not present

## 2019-06-02 DIAGNOSIS — R001 Bradycardia, unspecified: Secondary | ICD-10-CM | POA: Diagnosis not present

## 2019-06-02 LAB — BLOOD GAS, ARTERIAL
Acid-base deficit: 3.1 mmol/L — ABNORMAL HIGH (ref 0.0–2.0)
Bicarbonate: 23.2 mmol/L (ref 20.0–28.0)
FIO2: 0.5
MECHVT: 400 mL
O2 Saturation: 95 %
PEEP: 5 cmH2O
Patient temperature: 37
RATE: 16 resp/min
pCO2 arterial: 45 mmHg (ref 32.0–48.0)
pH, Arterial: 7.32 — ABNORMAL LOW (ref 7.350–7.450)
pO2, Arterial: 82 mmHg — ABNORMAL LOW (ref 83.0–108.0)

## 2019-06-02 LAB — CBC WITH DIFFERENTIAL/PLATELET
Abs Immature Granulocytes: 1.05 10*3/uL — ABNORMAL HIGH (ref 0.00–0.07)
Basophils Absolute: 0.2 10*3/uL — ABNORMAL HIGH (ref 0.0–0.1)
Basophils Relative: 1 %
Eosinophils Absolute: 0.2 10*3/uL (ref 0.0–0.5)
Eosinophils Relative: 1 %
HCT: 44.8 % (ref 36.0–46.0)
Hemoglobin: 14.7 g/dL (ref 12.0–15.0)
Immature Granulocytes: 5 %
Lymphocytes Relative: 9 %
Lymphs Abs: 1.8 10*3/uL (ref 0.7–4.0)
MCH: 28.7 pg (ref 26.0–34.0)
MCHC: 32.8 g/dL (ref 30.0–36.0)
MCV: 87.3 fL (ref 80.0–100.0)
Monocytes Absolute: 1.1 10*3/uL — ABNORMAL HIGH (ref 0.1–1.0)
Monocytes Relative: 5 %
Neutro Abs: 16.5 10*3/uL — ABNORMAL HIGH (ref 1.7–7.7)
Neutrophils Relative %: 79 %
Platelets: 167 10*3/uL (ref 150–400)
RBC: 5.13 MIL/uL — ABNORMAL HIGH (ref 3.87–5.11)
RDW: 17.2 % — ABNORMAL HIGH (ref 11.5–15.5)
Smear Review: NORMAL
WBC: 20.8 10*3/uL — ABNORMAL HIGH (ref 4.0–10.5)
nRBC: 12.1 % — ABNORMAL HIGH (ref 0.0–0.2)

## 2019-06-02 LAB — URINE CULTURE: Culture: >=100000 — AB

## 2019-06-02 LAB — PREPARE FRESH FROZEN PLASMA
Unit division: 0
Unit division: 0

## 2019-06-02 LAB — PATHOLOGIST SMEAR REVIEW

## 2019-06-02 LAB — GLUCOSE, CAPILLARY
Glucose-Capillary: 104 mg/dL — ABNORMAL HIGH (ref 70–99)
Glucose-Capillary: 73 mg/dL (ref 70–99)
Glucose-Capillary: 82 mg/dL (ref 70–99)
Glucose-Capillary: 95 mg/dL (ref 70–99)
Glucose-Capillary: 99 mg/dL (ref 70–99)

## 2019-06-02 LAB — BPAM FFP
Blood Product Expiration Date: 202012142359
Blood Product Expiration Date: 202012142359
ISSUE DATE / TIME: 202012090851
ISSUE DATE / TIME: 202012091303
Unit Type and Rh: 6200
Unit Type and Rh: 6200

## 2019-06-02 LAB — COMPREHENSIVE METABOLIC PANEL
ALT: 191 U/L — ABNORMAL HIGH (ref 0–44)
AST: 3731 U/L — ABNORMAL HIGH (ref 15–41)
Albumin: 2.6 g/dL — ABNORMAL LOW (ref 3.5–5.0)
Alkaline Phosphatase: 126 U/L (ref 38–126)
Anion gap: 21 — ABNORMAL HIGH (ref 5–15)
BUN: 71 mg/dL — ABNORMAL HIGH (ref 8–23)
CO2: 24 mmol/L (ref 22–32)
Calcium: 6.6 mg/dL — ABNORMAL LOW (ref 8.9–10.3)
Chloride: 102 mmol/L (ref 98–111)
Creatinine, Ser: 2.72 mg/dL — ABNORMAL HIGH (ref 0.44–1.00)
GFR calc Af Amer: 20 mL/min — ABNORMAL LOW (ref >60)
GFR calc non Af Amer: 18 mL/min — ABNORMAL LOW (ref >60)
Glucose, Bld: 116 mg/dL — ABNORMAL HIGH (ref 70–99)
Potassium: 3.6 mmol/L (ref 3.5–5.1)
Sodium: 147 mmol/L — ABNORMAL HIGH (ref 135–145)
Total Bilirubin: 5.4 mg/dL — ABNORMAL HIGH (ref 0.3–1.2)
Total Protein: 5 g/dL — ABNORMAL LOW (ref 6.5–8.1)

## 2019-06-02 LAB — THYROID PANEL WITH TSH
Free Thyroxine Index: 1.7 (ref 1.2–4.9)
T3 Uptake Ratio: 32 % (ref 24–39)
T4, Total: 5.2 ug/dL (ref 4.5–12.0)
TSH: 6.54 u[IU]/mL — ABNORMAL HIGH (ref 0.450–4.500)

## 2019-06-02 LAB — PROTIME-INR
INR: 3.7 — ABNORMAL HIGH (ref 0.8–1.2)
Prothrombin Time: 36.7 seconds — ABNORMAL HIGH (ref 11.4–15.2)

## 2019-06-02 LAB — HEMOGLOBIN AND HEMATOCRIT, BLOOD
HCT: 42.1 % (ref 36.0–46.0)
HCT: 44.5 % (ref 36.0–46.0)
Hemoglobin: 13.7 g/dL (ref 12.0–15.0)
Hemoglobin: 14.5 g/dL (ref 12.0–15.0)

## 2019-06-02 LAB — AMMONIA: Ammonia: 39 umol/L — ABNORMAL HIGH (ref 9–35)

## 2019-06-02 LAB — PROCALCITONIN: Procalcitonin: 1.7 ng/mL

## 2019-06-02 MED ORDER — POTASSIUM CL IN DEXTROSE 5% 20 MEQ/L IV SOLN
20.0000 meq | INTRAVENOUS | Status: DC
  Administered 2019-06-02 – 2019-06-05 (×4): 20 meq via INTRAVENOUS
  Filled 2019-06-02 (×7): qty 1000

## 2019-06-02 MED ORDER — IPRATROPIUM-ALBUTEROL 0.5-2.5 (3) MG/3ML IN SOLN
3.0000 mL | Freq: Three times a day (TID) | RESPIRATORY_TRACT | Status: DC
  Administered 2019-06-02 – 2019-06-05 (×11): 3 mL via RESPIRATORY_TRACT
  Filled 2019-06-02 (×11): qty 3

## 2019-06-02 MED ORDER — SODIUM CHLORIDE 0.9% IV SOLUTION: Freq: Once | INTRAVENOUS | Status: DC

## 2019-06-02 MED ORDER — PIPERACILLIN-TAZOBACTAM 3.375 G IVPB
3.3750 g | Freq: Three times a day (TID) | INTRAVENOUS | Status: DC
  Administered 2019-06-03: 3.375 g via INTRAVENOUS
  Filled 2019-06-02: qty 50

## 2019-06-02 MED ORDER — DEXTROSE 5 % IV SOLN
INTRAVENOUS | Status: DC
  Administered 2019-06-02: 13:00:00 via INTRAVENOUS

## 2019-06-02 NOTE — Progress Notes (Signed)
Follow up - Critical Care Medicine Note  Patient Details:    [name] is an 63 y.o. female with PMH of ETOH abuse and Parkinson's presented 06/04/2019 with Altered Mental Status.  Found to have AKI, Hypoglycemia, and Leukocytosis.  She decompensated (hypotensive, hypoxic, SOB, worsening mental status) in ED requiring intubation and vasopressors.  Noted to have frank blood in OG tube.  Requiring transcutaneous pacing with plans for transvenous pacer. Concern for Cardiogenic vs. Septic vs. Hemorrhagic shock.  Lines, Airways, Drains: Airway 7.5 mm (Active)  Secured at (cm) 22 cm 05/30/2019 1408  Measured From Lips 06/08/2019 Christmas 06/15/2019 1408  Secured By [employer] 06/22/2019 1408  Tube Holder Repositioned Yes 06/08/2019 1408  Cuff Pressure (cm H2O) 24 cm H2O 06/19/2019 1408  Site Condition Dry 06/04/2019 1408     CVC Triple Lumen 06/18/2019 Right Internal jugular (Active)  Indication for Insertion or Continuance of Line Vasoactive infusions 06/20/2019 0800  Site Assessment Clean;Dry;Intact 06/09/2019 0800  Proximal Lumen Status Infusing;Blood return noted 05/29/2019 0800  Medial Lumen Status Infusing;Blood return noted 06/08/2019 0800  Distal Lumen Status Infusing;Blood return noted 06/05/2019 0800  Dressing Type Transparent;Occlusive 06/10/2019 0800  Dressing Status Clean;Dry;Intact 06/09/2019 0800  Dressing Change Due 06/07/19 06/15/2019 0800     Negative Pressure Wound Therapy Hip Left (Active)     NG/OG Tube Orogastric 18 Fr. 50 cm (Active)  Cm Marking at Nare/Corner of Mouth (if applicable) 50 cm 77/41/28 1200  Site Assessment Dry;Intact 05/30/2019 1200  Ongoing Placement Verification No change in cm markings or external length of tube from initial placement 06/11/2019 0400  Status Suction-low intermittent 05/29/2019 1200  Drainage Appearance Bloody 06/15/2019 1200  Output (mL) 0 mL 06/15/2019 0730     Urethral Catheter Claiborne Billings, RN Double-lumen 16 Fr. (Active)  Indication for  Insertion or Continuance of Catheter Unstable critically ill patients first 24-48 hours (See Criteria) 06/14/2019 0800  Site Assessment Clean;Intact 06/16/2019 0800  Catheter Maintenance Bag below level of bladder;Catheter secured;Drainage bag/tubing not touching floor;Insertion date on drainage bag;No dependent loops;Seal intact 06/20/2019 0800  Collection Container Standard drainage bag 06/21/2019 0800  Securement Method Securing device (Describe) 06/04/2019 0800  Urinary Catheter Interventions (if applicable) Unclamped 78/67/67 0800  Output (mL) 15 mL 06/09/2019 1714    Anti-infectives:  Anti-infectives (From admission, onward)   Start     Dose/Rate Route Frequency Ordered Stop   06/13/2019 2130  piperacillin-tazobactam (ZOSYN) IVPB 3.375 g     3.375 g 12.5 mL/hr over 240 Minutes Intravenous Every 8 hours 05/24/2019 2123        Microbiology: Results for orders placed or performed during the hospital encounter of 05/25/2019  SARS CORONAVIRUS 2 (TAT 6-24 HRS) Nasopharyngeal Nasopharyngeal Swab     Status: None   Collection Time: 06/21/2019  2:54 PM   Specimen: Nasopharyngeal Swab  Result Value Ref Range Status   SARS Coronavirus 2 NEGATIVE NEGATIVE Final    Comment: (NOTE) SARS-CoV-2 target nucleic acids are NOT DETECTED. The SARS-CoV-2 RNA is generally detectable in upper and lower respiratory specimens during the acute phase of infection. Negative results do not preclude SARS-CoV-2 infection, do not rule out co-infections with other pathogens, and should not be used as the sole basis for treatment or other patient management decisions. Negative results must be combined with clinical observations, patient history, and epidemiological information. The expected result is Negative. Fact Sheet for Patients: SugarRoll.be Fact Sheet for Healthcare Providers: https://www.woods-mathews.com/ This test is not yet approved or cleared by  the [employer] and   has been authorized for detection and/or diagnosis of SARS-CoV-2 by FDA under an Emergency Use Authorization (EUA). This EUA will remain  in effect (meaning this test can be used) for the duration of the COVID-19 declaration under Section 56 4(b)(1) of the Act, 21 U.S.C. section 360bbb-3(b)(1), unless the authorization is terminated or revoked sooner. Performed at [facility], [address], [place], [place] [address]   Urine culture     Status: Abnormal   Collection Time: 05/24/2019  3:05 PM   Specimen: Urine, Random  Result Value Ref Range Status   Specimen Description   Final    URINE, RANDOM Performed at [facility], [address] [place], [place] [address]    Special Requests   Final    NONE Performed at [facility], [address] [place], [place] [address]    Culture >=100,000 COLONIES/mL ESCHERICHIA COLI (A)  Final   Report Status 06/02/2019 FINAL  Final   Organism ID, Bacteria ESCHERICHIA COLI (A)  Final      Susceptibility   Escherichia coli - MIC*    AMPICILLIN >=32 RESISTANT Resistant     CEFAZOLIN <=4 SENSITIVE Sensitive     CEFTRIAXONE <=1 SENSITIVE Sensitive     CIPROFLOXACIN <=0.25 SENSITIVE Sensitive     GENTAMICIN <=1 SENSITIVE Sensitive     IMIPENEM <=0.25 SENSITIVE Sensitive     NITROFURANTOIN <=16 SENSITIVE Sensitive     TRIMETH/SULFA <=20 SENSITIVE Sensitive     AMPICILLIN/SULBACTAM 16 INTERMEDIATE Intermediate     PIP/TAZO <=4 SENSITIVE Sensitive     * >=100,000 COLONIES/mL ESCHERICHIA COLI  CULTURE, BLOOD (ROUTINE X 2) w Reflex to ID Panel     Status: None (Preliminary result)   Collection Time: 06/05/2019  6:18 PM   Specimen: BLOOD  Result Value Ref Range Status   Specimen Description BLOOD BLOOD RIGHT HAND  Final   Special Requests   Final    BOTTLES DRAWN AEROBIC AND ANAEROBIC Blood Culture adequate volume   Culture   Final    NO GROWTH 2 DAYS Performed at [name], [address] [place],  [place] [address]    Report Status PENDING  Incomplete  Respiratory Panel by RT PCR (Flu A&B, Covid) - Nasopharyngeal Swab     Status: None   Collection Time: 05/30/2019  9:14 PM   Specimen: Nasopharyngeal Swab  Result Value Ref Range Status   SARS Coronavirus 2 by RT PCR NEGATIVE NEGATIVE Final    Comment: (NOTE) SARS-CoV-2 target nucleic acids are NOT DETECTED. The SARS-CoV-2 RNA is generally detectable in upper respiratoy specimens during the acute phase of infection. The lowest concentration of SARS-CoV-2 viral copies this assay can detect is 131 copies/mL. A negative result does not preclude SARS-Cov-2 infection and should not be used as the sole basis for treatment or other patient management decisions. A negative result may occur with  improper specimen collection/handling, submission of specimen other than nasopharyngeal swab, presence of viral mutation(s) within the areas targeted by this assay, and inadequate number of viral copies (<131 copies/mL). A negative result must be combined with clinical observations, patient history, and epidemiological information. The expected result is Negative. Fact Sheet for Patients:  PinkCheek.be Fact Sheet for Healthcare Providers:  GravelBags.it This test is not yet ap proved or cleared by the [place] FDA and  has been authorized for detection and/or diagnosis of SARS-CoV-2 by FDA under an Emergency Use Authorization (EUA). This EUA will  remain  in effect (meaning this test can be used) for the duration of the COVID-19 declaration under Section 564(b)(1) of the Act, 21 U.S.C. section 360bbb-3(b)(1), unless the authorization is terminated or revoked sooner.    Influenza A by PCR NEGATIVE NEGATIVE Final   Influenza B by PCR NEGATIVE NEGATIVE Final    Comment: (NOTE) The Xpert Xpress SARS-CoV-2/FLU/RSV assay is intended as an aid in  the diagnosis of influenza from Nasopharyngeal swab  specimens and  should not be used as a sole basis for treatment. Nasal washings and  aspirates are unacceptable for Xpert Xpress SARS-CoV-2/FLU/RSV  testing. Fact Sheet for Patients: PinkCheek.be Fact Sheet for Healthcare Providers: GravelBags.it This test is not yet approved or cleared by the [place] FDA and  has been authorized for detection and/or diagnosis of SARS-CoV-2 by  FDA under an Emergency Use Authorization (EUA). This EUA will remain  in effect (meaning this test can be used) for the duration of the  Covid-19 declaration under Section 564(b)(1) of the Act, 21  U.S.C. section 360bbb-3(b)(1), unless the authorization is  terminated or revoked. Performed at [facility], [address] [place], [place] [address]   MRSA PCR Screening     Status: None   Collection Time: 06/10/2019 10:35 PM   Specimen: Nasopharyngeal  Result Value Ref Range Status   MRSA by PCR NEGATIVE NEGATIVE Final    Comment:        The GeneXpert MRSA Assay (FDA approved for NASAL specimens only), is one component of a comprehensive MRSA colonization surveillance program. It is not intended to diagnose MRSA infection nor to guide or monitor treatment for MRSA infections. Performed at [name], [address] [place], [place] [address]   CULTURES: Blood x2 12/8>> Urine 12/8>> E. coli Tracheal aspirate 12/8>> SARS-CoV-2 PCR 12/8>> NEGATIVE Influenza PCR 12/8>>NEGATIVE Respiratory Viral Panel 12/8>> NEGATIVE MRSA PCR 12/8>> NEGATIVE  Best Practice/Protocols:  VTE Prophylaxis: Mechanical GI Prophylaxis: Proton Pump Inhibitor  ANTIBIOTICS: Zosyn 12/8>>    Events: 12/8- Presented to ED 12/8- Decompensated in ED requiring intubation and vasopressors 12/8- right IJ CVC 12/9- Transvenous pacer placed, right femoral approach  Studies: EEG  Result Date: 06/23/2019 Lora Havens, MD     06/17/2019  5:22  PM Patient Name: [name] MRN: [id number] Epilepsy Attending: Lora Havens Referring Physician/Provider: Darel Hong, NP Date: 06/14/2019 Duration: 24.24mns Patient history: 89yoF with ams. EEG to evaluate for seizure Level of alertness: comatose AEDs during EEG study: Librium, lorazepam Technical aspects: This EEG study was done with scalp electrodes positioned according to the 10-20 International system of electrode placement. Electrical activity was acquired at a sampling rate of '500Hz'$  and reviewed with a high frequency filter of '70Hz'$  and a low frequency filter of '1Hz'$ . EEG data were recorded continuously and digitally stored. DESCRIPTION: EEG showed continuous generalized 3-'5hz'$  theta-delta slowing admixed with an excessive amount of 15 to 18 Hz, 2-3 uV beta activity with irregular morphology distributed symmetrically and diffusely. Hyperventilation and photic stimulation were not performed. Episode of left shoulder and feet and head twitching was recorded per eeg tech annotation ( difficult to visualize on video). Concomitant eeg before, during and after the event didn't show any eeg change to suggest seizure. ABNORMALITY - Continuous slow, generalized - Excessive beta, generalized IMPRESSION: This study is suggestive of severe diffuse encephalopathy, non specific to etiology.  The excessive beta activity seen in the background is most likely due to the effect of benzodiazepine and is  a benign EEG pattern. One episode of left shoulder, feet and head twitching was recorded without EEG change and was likely non epileptic. No definite seizures or epileptiform discharges were seen throughout the recording. Clinical correlation is recommended Priyanka Barbra Sarks   CT ABDOMEN PELVIS WO CONTRAST  Result Date: 06/15/2019 CLINICAL DATA:  Abdominal rigidity EXAM: CT ABDOMEN AND PELVIS WITHOUT CONTRAST TECHNIQUE: Multidetector CT imaging of the abdomen and pelvis was performed following the standard protocol  without IV contrast. COMPARISON:  Ultrasound from 04/01/2019 FINDINGS: Lower chest: Small right pleural effusion is noted with bibasilar atelectasis right greater than left. No focal parenchymal nodules are seen. Gastric catheter is noted extending into the stomach. Hepatobiliary: Liver is well visualized and within normal limits. Gallbladder demonstrates some mild wall thickening likely reactive related to the ascites. Increased density is noted within consistent with cholelithiasis and gallbladder sludge similar to that noted on prior ultrasound. Pancreas: Unremarkable. No pancreatic ductal dilatation or surrounding inflammatory changes. Spleen: Normal in size without focal abnormality. Adrenals/Urinary Tract: Adrenal glands are within normal limits. Kidneys are well visualized bilaterally. No renal calculi or obstructive changes are seen. The bladder is decompressed by Foley catheter and obscured by bilateral hip replacements. Stomach/Bowel: Scattered diverticular change of the colon is noted without evidence of diverticulitis. No obstructive or inflammatory changes are seen. The appendix is within normal limits. Some inflammatory changes are noted surrounding the second portion of the duodenum and head of the pancreas as well as extending into the mesentery inferiorly. This may be related to some focal duodenitis although may be reactive related to the mild ascites. Vascular/Lymphatic: Aortic atherosclerosis. No enlarged abdominal or pelvic lymph nodes. Reproductive: Status post hysterectomy. No adnexal masses. Other: No abdominal wall hernia or abnormality. No abdominopelvic ascites. Musculoskeletal: Bilateral hip replacements are noted. Degenerative changes of lumbar spine are seen. IMPRESSION: Small right pleural effusion with bibasilar atelectasis. Cholelithiasis and gallbladder sludge. Mild ascites. Inflammatory changes surrounding duodenum and head of the pancreas likely related to some duodenitis. No  perforation or definitive ulcer crater is seen. These changes may be in part related to the underlying ascites as well. No other focal abnormality is noted. Electronically Signed   By: Inez Catalina M.D.   On: 06/20/2019 22:28   CT HEAD WO CONTRAST  Result Date: 05/30/2019 CLINICAL DATA:  Encephalopathy EXAM: CT HEAD WITHOUT CONTRAST TECHNIQUE: Contiguous axial images were obtained from the base of the skull through the vertex without intravenous contrast. COMPARISON:  06/12/2019 FINDINGS: Brain: There is no mass, hemorrhage or extra-axial collection. The size and configuration of the ventricles and extra-axial CSF spaces are normal. The brain parenchyma is normal, without acute or chronic infarction. Vascular: No abnormal hyperdensity of the major intracranial arteries or dural venous sinuses. No intracranial atherosclerosis. Skull: The visualized skull base, calvarium and extracranial soft tissues are normal. Sinuses/Orbits: No fluid levels or advanced mucosal thickening of the visualized paranasal sinuses. No mastoid or middle ear effusion. The orbits are normal. IMPRESSION: Normal head CT. Electronically Signed   By: Ulyses Jarred M.D.   On: 05/30/2019 02:53   CT Head Wo Contrast  Result Date: 06/22/2019 CLINICAL DATA:  Altered level of consciousness. Confusion. Parkinson's. EXAM: CT HEAD WITHOUT CONTRAST TECHNIQUE: Contiguous axial images were obtained from the base of the skull through the vertex without intravenous contrast. COMPARISON:  April 13, 2019 FINDINGS: Brain: No subdural, epidural, or subarachnoid hemorrhage identified. Cerebellum, brainstem, and basal cisterns are normal. No mass effect or midline shift. No acute cortical  ischemia or infarct identified. Vascular: No hyperdense vessel or unexpected calcification. Skull: Normal. Negative for fracture or focal lesion. Sinuses/Orbits: No acute finding. Other: None. IMPRESSION: 1. No acute intracranial abnormalities are identified. 2.  Electronically Signed   By: Dorise Bullion III M.D   On: 06/04/2019 14:29   CARDIAC CATHETERIZATION  Result Date: 06/10/2019 Successful transvenous temporary pacemaker  US RENAL  Result Date: 06/16/2019 CLINICAL DATA:  AKI EXAM: RENAL / URINARY TRACT ULTRASOUND COMPLETE COMPARISON:  None. FINDINGS: Right Kidney: Renal measurements: 10.4 x 4.5 x 5.6 cm = volume: 136 mL . Echogenicity is increased. No mass or hydronephrosis visualized. Left Kidney: Renal measurements: 10.2 x 5.3 x 4.3 cm = volume: 123 mL. Echogenicity is increased. No mass or hydronephrosis visualized. Bladder: Decompressed by Foley catheter. Other: Cholelithiasis, sludge, mild gallbladder wall thickening. Mild perinephric fluid. Free fluid is present in the pelvis. IMPRESSION: No hydronephrosis. Increased renal echogenicity reflecting medical renal disease. Mild free fluid. Gallbladder stones, sludge, and wall thickening. Electronically Signed   By: Macy Mis M.D.   On: 06/09/2019 11:35   DG Chest Port 1 View  Result Date: 06/02/2019 CLINICAL DATA:  Acute respiratory failure. EXAM: PORTABLE CHEST 1 VIEW COMPARISON:  Radiograph 06/02/2019 FINDINGS: Endotracheal tube tip 17 mm from the carina. Enteric tube tip and side-port below the diaphragm in the stomach. Right internal jugular central venous catheter tip in the upper SVC. Pacemaker from an inferior approach projects over the expected right ventricle. Increasing hazy opacity at the lung bases consistent with pleural effusions and atelectasis. Vascular congestion. No pneumothorax. IMPRESSION: 1. Worsening bilateral pleural effusions, right greater than left. 2. Vascular congestion. 3. Temporary pacemaker placement. Support apparatus otherwise unchanged. Electronically Signed   By: Keith Rake M.D.   On: 06/02/2019 05:08   DG Chest Port 1 View  Result Date: 06/13/2019 CLINICAL DATA:  Central line placement. EXAM: PORTABLE CHEST 1 VIEW COMPARISON:  Radiograph earlier this  day. FINDINGS: Tip of the right internal jugular central venous catheter projects over the upper SVC. No visualized pneumothorax. Endotracheal tube and enteric tube remain in place. Borderline cardiomegaly. Unchanged vascular congestion. No new airspace disease. Streaky bibasilar atelectasis. IMPRESSION: 1. Tip of the right internal jugular central venous catheter projects over the upper SVC. No pneumothorax. 2. Unchanged vascular congestion. 3. Streaky bibasilar atelectasis. Electronically Signed   By: Keith Rake M.D.   On: 06/08/2019 00:19   DG Chest Portable 1 View  Result Date: 06/17/2019 CLINICAL DATA:  Respiratory distress. Endotracheal tube and OG tube placement. EXAM: PORTABLE CHEST 1 VIEW COMPARISON:  05/28/2019 FINDINGS: Endotracheal tube is noted with tip 2.8 cm above the carina. An OG tube is noted entering the stomach with tip just off of the field of view. This is a mildly low volume film. Cardiomegaly and mild pulmonary vascular congestion noted. No pneumothorax or pleural effusion. Defibrillator/pacing pads overlying the chest noted. No acute bony abnormalities are identified. IMPRESSION: 1. Endotracheal tube with tip 2.8 cm above the carina. 2. OG tube entering the stomach with tip just off of the field of view. 3. Cardiomegaly with mild pulmonary vascular congestion. Electronically Signed   By: Margarette Canada M.D.   On: 05/30/2019 20:51   DG Chest Portable 1 View  Result Date: 06/08/2019 CLINICAL DATA:  Altered mental status. EXAM: PORTABLE CHEST 1 VIEW COMPARISON:  April 17, 2019. FINDINGS: The heart size and mediastinal contours are within normal limits. Both lungs are clear. The visualized skeletal structures are unremarkable. IMPRESSION: No active disease.  Electronically Signed   By: Marijo Conception M.D.   On: 06/14/2019 14:02   DG Abd Portable 1 View  Result Date: 06/21/2019 CLINICAL DATA:  OG tube placement. EXAM: PORTABLE ABDOMEN - 1 VIEW COMPARISON:  None. FINDINGS: An OG  tube is noted with tip overlying the mid stomach. Bowel gas pattern is unremarkable. IMPRESSION: 1. OG tube with tip overlying the mid stomach. 2. Unremarkable bowel gas pattern. Electronically Signed   By: Margarette Canada M.D.   On: 06/11/2019 20:52   ECHOCARDIOGRAM COMPLETE  Result Date: 06/14/2019   ECHOCARDIOGRAM REPORT   Patient Name:   [name] Date of Exam: 06/13/2019 Medical Rec #:  [id number]     Height:       65.0 in Accession #:    [id number]    Weight:       135.4 lb Date of Birth:  15-Feb-1956     BSA:          1.68 m Patient Age:    49 years      BP:           133/70 mmHg Patient Gender: F             HR:           70 bpm. Exam Location:  [facility] Procedure: 2D Echo, Color Doppler and Cardiac Doppler Indications:     R55 Syncope  History:         Patient has no prior history of Echocardiogram examinations.                  ETOH abuse.  Sonographer:     Charmayne Sheer RDCS (AE) Referring Phys:  [id number] TAWFIKUL ALAM Diagnosing Phys: Isaias Cowman MD  Sonographer Comments: Echo performed with patient supine and on artificial respirator. IMPRESSIONS  1. Left ventricular ejection fraction, by visual estimation, is 40 to 45%. The left ventricle has normal function. There is no left ventricular hypertrophy.  2. Mildly dilated left ventricular internal cavity size.  3. The left ventricle has no regional wall motion abnormalities.  4. Global right ventricle has normal systolic function.The right ventricular size is normal. No increase in right ventricular wall thickness.  5. Left atrial size was mild-moderately dilated.  6. Right atrial size was normal.  7. The mitral valve is normal in structure. Moderate mitral valve regurgitation. No evidence of mitral stenosis.  8. The tricuspid valve is normal in structure. Tricuspid valve regurgitation is not demonstrated.  9. The aortic valve is normal in structure. Aortic valve regurgitation is not visualized. No evidence of aortic valve sclerosis or stenosis. 10. The  pulmonic valve was normal in structure. Pulmonic valve regurgitation is not visualized. 11. Moderately elevated pulmonary artery systolic pressure. 12. The inferior vena cava is normal in size with greater than 50% respiratory variability, suggesting right atrial pressure of 3 mmHg. FINDINGS  Left Ventricle: Left ventricular ejection fraction, by visual estimation, is 40 to 45%. The left ventricle has normal function. The left ventricle has no regional wall motion abnormalities. The left ventricular internal cavity size was mildly dilated left ventricle. There is no left ventricular hypertrophy. Normal left atrial pressure. Right Ventricle: The right ventricular size is normal. No increase in right ventricular wall thickness. Global RV systolic function is has normal systolic function. The tricuspid regurgitant velocity is 2.96 m/s, and with an assumed right atrial pressure  of 10 mmHg, the estimated right ventricular systolic pressure is moderately elevated at 45.2 mmHg. Left  Atrium: Left atrial size was mild-moderately dilated. Right Atrium: Right atrial size was normal in size Pericardium: There is no evidence of pericardial effusion. Mitral Valve: The mitral valve is normal in structure. Moderate mitral valve regurgitation. No evidence of mitral valve stenosis by observation. MV peak gradient, 6.2 mmHg. Tricuspid Valve: The tricuspid valve is normal in structure. Tricuspid valve regurgitation is not demonstrated. Aortic Valve: The aortic valve is normal in structure. Aortic valve regurgitation is not visualized. The aortic valve is structurally normal, with no evidence of sclerosis or stenosis. Aortic valve mean gradient measures 2.0 mmHg. Aortic valve peak gradient measures 3.7 mmHg. Aortic valve area, by VTI measures 1.32 cm. Pulmonic Valve: The pulmonic valve was normal in structure. Pulmonic valve regurgitation is not visualized. Pulmonic regurgitation is not visualized. Aorta: The aortic root, ascending  aorta and aortic arch are all structurally normal, with no evidence of dilitation or obstruction. Venous: The inferior vena cava is normal in size with greater than 50% respiratory variability, suggesting right atrial pressure of 3 mmHg. IAS/Shunts: No atrial level shunt detected by color flow Doppler. There is no evidence of a patent foramen ovale. No ventricular septal defect is seen or detected. There is no evidence of an atrial septal defect.  LEFT VENTRICLE PLAX 2D LVIDd:         4.94 cm LVIDs:         4.14 cm LV PW:         0.84 cm LV IVS:        0.65 cm LVOT diam:     1.80 cm LV SV:         39 ml LV SV Index:   23.20 LVOT Area:     2.54 cm  LV Volumes (MOD) LV area d, A2C:    20.50 cm LV area d, A4C:    20.50 cm LV area s, A2C:    14.00 cm LV area s, A4C:    15.60 cm LV major d, A2C:   6.47 cm LV major d, A4C:   5.89 cm LV major s, A2C:   5.74 cm LV major s, A4C:   5.62 cm LV vol d, MOD A2C: 53.7 ml LV vol d, MOD A4C: 57.7 ml LV vol s, MOD A2C: 28.2 ml LV vol s, MOD A4C: 36.1 ml LV SV MOD A2C:     25.5 ml LV SV MOD A4C:     57.7 ml LV SV MOD BP:      26.1 ml RIGHT VENTRICLE RV Basal diam:  3.13 cm LEFT ATRIUM             Index       RIGHT ATRIUM           Index LA diam:        4.20 cm 2.51 cm/m  RA Area:     17.40 cm LA Vol (A2C):   80.6 ml 48.10 ml/m RA Volume:   48.40 ml  28.88 ml/m LA Vol (A4C):   78.9 ml 47.08 ml/m LA Biplane Vol: 82.7 ml 49.35 ml/m  AORTIC VALVE                   PULMONIC VALVE AV Area (Vmax):    1.51 cm    PV Vmax:       0.75 m/s AV Area (Vmean):   1.39 cm    PV Vmean:      48.300 cm/s AV Area (VTI):     1.32 cm  PV VTI:        0.117 m AV Vmax:           95.70 cm/s  PV Peak grad:  2.3 mmHg AV Vmean:          62.900 cm/s PV Mean grad:  1.0 mmHg AV VTI:            0.162 m AV Peak Grad:      3.7 mmHg AV Mean Grad:      2.0 mmHg LVOT Vmax:         56.90 cm/s LVOT Vmean:        34.400 cm/s LVOT VTI:          0.084 m LVOT/AV VTI ratio: 0.52  AORTA Ao Root diam: 2.80 cm MITRAL  VALVE                         TRICUSPID VALVE MV Area (PHT): 4.89 cm              TR Peak grad:   35.2 mmHg MV Peak grad:  6.2 mmHg              TR Vmax:        304.00 cm/s MV Mean grad:  2.0 mmHg MV Vmax:       1.24 m/s              SHUNTS MV Vmean:      65.6 cm/s             Systemic VTI:  0.08 m MV VTI:        0.29 m                Systemic Diam: 1.80 cm MV PHT:        44.95 msec MV Decel Time: 155 msec MV E velocity: 107.00 cm/s 103 cm/s MV A velocity: 39.80 cm/s  70.3 cm/s MV E/A ratio:  2.69        1.5  Isaias Cowman MD Electronically signed by Isaias Cowman MD Signature Date/Time: 06/17/2019/1:40:24 PM    Final     Consults: Treatment Team:  Isaias Cowman, MD Tyler Pita, MD Leotis Pain, MD Murlean Iba, MD Efrain Sella, MD   Subjective:    Overnight Issues: Intubated and mechanically ventilated.  Remains encephalopathic  Objective:  Vital signs for last 24 hours: Temp:  [99 F (37.2 C)-100.4 F (38 C)] 99.5 F (37.5 C) (12/10 1700) Pulse Rate:  [57-75] 66 (12/10 1700) Resp:  [0-22] 22 (12/10 1700) BP: (102-136)/(50-68) 125/59 (12/10 1700) SpO2:  [94 %-100 %] 94 % (12/10 1700) FiO2 (%):  [24 %-60 %] 24 % (12/10 1637) Weight:  [62.8 kg] 62.8 kg (12/10 0422)  Hemodynamic parameters for last 24 hours: CVP:  [13 mmHg-26 mmHg] 13 mmHg  Intake/Output from previous day: 12/09 0701 - 12/10 0700 In: 4405.1 [I.V.:3753.1; Blood:452; IV Piggyback:200] Out: 15 [Urine:15]  Intake/Output this shift: No intake/output data recorded.  Vent settings for last 24 hours: Vent Mode: PRVC FiO2 (%):  [24 %-60 %] 24 % Set Rate:  [16 bmp] 16 bmp Vt Set:  [400 mL] 400 mL PEEP:  [5 cmH20-8 cmH20] 5 cmH20 Plateau Pressure:  [20 cmH20] 20 cmH20  Physical Exam:  General: Critically ill appearing female, intubated, synchronous with the vent, Neuro:  Unresponsive on no sedation, pupils fixed and dilated (6 mm bilaterally), no corneal reflex, no cough/gag  reflex, opens eyes, some nystagmus noted,  no tracking HEENT:  Atraumatic, normocephalic, neck supple, no JVD Cardiovascular:  Irregular rhythm, bradycardia Lungs:  Coarse breath sounds throughout, vent assisted Abdomen:  Soft, nontender, nondistended, no guarding or rebound tenderness, Hypoactive bowel sounds Musculoskeletal:  LUE amputation Skin:  Cool, mottled, no obvious rashes, lesions, or ulcerations  Assessment/Plan:  PULMONARY A: Acute Hypoxic Respiratory Failure, likely secondary to severe metabolic acidosis and pulmonary vascular congestion Hx: Asthma, no exacerbation P:   -Full vent support -Follow intermittent CXR & ABG as needed -Wean FiO2 & PEEP as tolerated -Spontaneous breathing trials when respiratory parameters met -PRN Bronchodilators   CARDIOVASCULAR A:  Hypotension, likely Cardiogenic shock vs.Septic shock vs.Hemorrhagic shock Bradycardia P:  -Continuous cardiac monitoring -Maintain MAP >65 -IV fluids -Levophed as needed to maintain MAP goal -Trend CVP -Cardiology following, appreciate input -Transvenous pacer has helped hemodynamics -Trend troponin -Check BNP -Echocardiogram performed EF is 40 to 45%, normal RV, moderate MR  RENAL A:   AKI likely ATN Anion gap Metabolic acidosis Rhabdomyolysis Hyperkalemia>>resolved Hyponatremia>>resolved P:   -Monitor I&O's / urinary output -Follow BMP -Ensure adequate renal perfusion -Avoid nephrotoxic agents as able, pharmacy consultation for dosing of medications -Replace electrolytes as indicated -Nephrology consulted, appreciate input -IV fluids -Follow serum CK -Renal US: No hydronephrosis, medical renal disease -Bicarb gtt, discontinued today   GASTROINTESTINAL A:   GI Bleed Cholelithiasis w/ Gallbladder sludge CT Abdomen & Pelvis w/ Duodenitis Elevated LFT's, likely shock liver Hyperammonemia P:   -NPO -Protonix gtt -GI consulted, appreciate input, discussed with Dr. Alice Reichert, check  acetaminophen level, empiric NAC -Transfuse as indicated -Consider General Surgery consult -Trend LFT's & Ammonia -Check Hepatitis panel  HEMATOLOGIC A:   Acute blood loss anemia Supratherapeutic INR, likely in setting of liver failure vs. DIC P:  -Monitor for S/Sx of bleeding -Trend CBC -SCD's for VTE Prophylaxis (no chemical prophylaxis given GI bleed) -Transfuse for Hgb <8 -Trend bleeding times -Check DIC panel -Consider FFP & Vitamin K   INFECTIOUS A:   Leukocytosis Meets SIRS Criteria, CT Abdomen with Duodenitis P:   -Monitor fever curve -Trend WBC's & Procalcitonin -Continue empiric Zosyn -Follow pan cultures as above -CXR without infiltrate -CT Abdomen with Cholelithiasis and gallbladder slude, and with Duodenitis (no perforation noted)   ENDOCRINE A:   Hypoglycemia   P:   -CBG's -Follow ICU Hypo/hyperglycemia protocol -D5 infusion  NEUROLOGIC A:   Altered mental status, now unresponsive without corneal & gag/cough reflexes; concern for anoxic injury Hx: Parkinson's, Bipolar disorder P:   -RASS goal: 0 to -1 -Fentanyl pushes as needed to maintain RASS goal -Daily wake up assessments -CT head negative 12/8 -Will repeat CT Head due to poor Neuro exam upon arrival to ICU>> repeat CT Head normal -Obtained EEG: No seizures, diffuse encephalopathy, nonspecific.  Excessive beta activity consistent with benzodiazepine -Consult Neurology, appreciate input -Urine drug screen positive for benzodiazepines -On standing dose Librium for potential EtOH withdrawal, discontinue    LOS: 2 days   Additional comments: Had lengthy discussion with the patient's son and POA [name] and with daughter [name].  They have been apprised of the situation and the severity of her multiorgan failure.  They are leaning towards DNR status however not committed yet.  Will get palliative care consult.  Critical Care Total Time*: 45 Minutes  C. Derrill Kay,  MD  PCCM 06/02/2019  *Care during the described time interval was provided by me and/or other providers on the critical care team.  I have reviewed this patient's available data, including medical  history, events of note, physical examination and test results as part of my evaluation.  **This note was dictated using voice recognition software/Dragon.  Despite best efforts to proofread, errors can occur which can change the meaning.  Any change was purely unintentional.

## 2019-06-02 NOTE — Progress Notes (Signed)
[place], [place] 06/02/19  Subjective:   Hospital day # 2  Neuro: sedated Cvs: pacemaker, pressors Pulm: Fio2 40%, vent Gi:OGT with dark aspirate Renal: 12/09 0701 - 12/10 0700 In: 4405.1 [I.V.:3753.1; Blood:452; IV Piggyback:200] Out: 15 [Urine:15] Lab Results  Component Value Date   CREATININE 2.72 (H) 06/02/2019   CREATININE 1.90 (H) 05/28/2019   CREATININE 2.02 (H) 05/25/2019     Objective:  Vital signs in last 24 hours:  Temp:  [97.5 F (36.4 C)-100.6 F (38.1 C)] 99 F (37.2 C) (12/10 0800) Pulse Rate:  [57-75] 57 (12/10 0800) Resp:  [0-22] 18 (12/10 0800) BP: (67-189)/(36-166) 109/53 (12/10 0800) SpO2:  [94 %-100 %] 98 % (12/10 0845) FiO2 (%):  [40 %-60 %] 40 % (12/10 0845) Weight:  [62.8 kg] 62.8 kg (12/10 0422)  Weight change: 4.286 kg Filed Weights   06/14/2019 1207 06/13/2019 0438 06/02/19 0422  Weight: 58.5 kg 61.4 kg 62.8 kg    Intake/Output:    Intake/Output Summary (Last 24 hours) at 06/02/2019 0905 Last data filed at 06/02/2019 0400 Gross per 24 hour  Intake 4176.13 ml  Output 15 ml  Net 4161.13 ml     Physical Exam: General: Critically ill appearing  HEENT ETT, OGT  Pulm/lungs Vent assisted,   CVS/Heart paced  Abdomen:  Soft, non distended  Extremities: trace edema  Neurologic: sedated  Skin: warm   foley       Basic Metabolic Panel:  Recent Labs  Lab 05/28/2019 1228 06/15/2019 2049 06/17/2019 0001 06/16/2019 0429 06/02/19 0445  NA 134* 137 137 139 147*  K 5.6* 5.7* 4.8 4.0 3.6  CL 97* 106 102 104 102  CO2 15* 12* 15* 18* 24  GLUCOSE 56* 184* 224* 291* 116*  BUN 72* 68* 62* 60* 71*  CREATININE 2.09* 2.04* 2.02* 1.90* 2.72*  CALCIUM 8.9 7.9* 7.4* 6.8* 6.6*     CBC: Recent Labs  Lab 06/04/2019 1228 06/08/2019 2049 05/24/2019 0001 05/29/2019 0429 06/13/2019 0800 06/22/2019 1602 06/02/19 0445  WBC 16.0* 23.1*  --  27.4*  --   --  20.8*  NEUTROABS 12.4* 18.3*  --   --   --   --  16.5*  HGB 11.6*  11.2* 13.9 14.3 14.4 13.3 14.7  HCT 36.4 37.3 44.6 45.0 45.2 40.5 44.8  MCV 91.2 98.9  --  91.1  --   --  87.3  PLT 381 244  --  158  --   --  167      Lab Results  Component Value Date   HEPBSAG NON REACTIVE 06/05/2019   HEPBIGM NON REACTIVE 06/03/2019      Microbiology:  Recent Results (from the past 240 hour(s))  SARS CORONAVIRUS 2 (TAT 6-24 HRS) Nasopharyngeal Nasopharyngeal Swab     Status: None   Collection Time: 06/13/2019  2:54 PM   Specimen: Nasopharyngeal Swab  Result Value Ref Range Status   SARS Coronavirus 2 NEGATIVE NEGATIVE Final    Comment: (NOTE) SARS-CoV-2 target nucleic acids are NOT DETECTED. The SARS-CoV-2 RNA is generally detectable in upper and lower respiratory specimens during the acute phase of infection. Negative results do not preclude SARS-CoV-2 infection, do not rule out co-infections with other pathogens, and should not be used as the sole basis for treatment or other patient management decisions. Negative results must be combined with clinical observations, patient history, and epidemiological information. The expected result is Negative. Fact Sheet for Patients: SugarRoll.be Fact Sheet for Healthcare Providers: https://www.woods-mathews.com/ This test is not  yet approved or cleared by the [place] and  has been authorized for detection and/or diagnosis of SARS-CoV-2 by FDA under an Emergency Use Authorization (EUA). This EUA will remain  in effect (meaning this test can be used) for the duration of the COVID-19 declaration under Section 56 4(b)(1) of the Act, 21 U.S.C. section 360bbb-3(b)(1), unless the authorization is terminated or revoked sooner. Performed at [facility], [place] [address] [place], [place] [address]   Urine culture     Status: Abnormal (Preliminary result)   Collection Time: 06/20/2019  3:05 PM   Specimen: Urine, Random  Result Value Ref Range Status   Specimen  Description   Final    URINE, RANDOM Performed at [facility], [address] [place], [place] [address]    Special Requests   Final    NONE Performed at [facility], [address] [place], [place] [address]    Culture (A)  Final    >=100,000 COLONIES/mL GRAM NEGATIVE RODS IDENTIFICATION AND SUSCEPTIBILITIES TO FOLLOW Performed at [facility], [place] [address], [place], [place] [address]    Report Status PENDING  Incomplete  CULTURE, BLOOD (ROUTINE X 2) w Reflex to ID Panel     Status: None (Preliminary result)   Collection Time: 06/19/2019  6:18 PM   Specimen: BLOOD  Result Value Ref Range Status   Specimen Description BLOOD BLOOD RIGHT HAND  Final   Special Requests   Final    BOTTLES DRAWN AEROBIC AND ANAEROBIC Blood Culture adequate volume   Culture   Final    NO GROWTH 2 DAYS Performed at [name], [address] [place], [place] [address]    Report Status PENDING  Incomplete  Respiratory Panel by RT PCR (Flu A&B, Covid) - Nasopharyngeal Swab     Status: None   Collection Time: 06/07/2019  9:14 PM   Specimen: Nasopharyngeal Swab  Result Value Ref Range Status   SARS Coronavirus 2 by RT PCR NEGATIVE NEGATIVE Final    Comment: (NOTE) SARS-CoV-2 target nucleic acids are NOT DETECTED. The SARS-CoV-2 RNA is generally detectable in upper respiratoy specimens during the acute phase of infection. The lowest concentration of SARS-CoV-2 viral copies this assay can detect is 131 copies/mL. A negative result does not preclude SARS-Cov-2 infection and should not be used as the sole basis for treatment or other patient management decisions. A negative result may occur with  improper specimen collection/handling, submission of specimen other than nasopharyngeal swab, presence of viral mutation(s) within the areas targeted by this assay, and inadequate number of viral copies (<131 copies/mL). A negative result must be combined with  clinical observations, patient history, and epidemiological information. The expected result is Negative. Fact Sheet for Patients:  PinkCheek.be Fact Sheet for Healthcare Providers:  GravelBags.it This test is not yet ap proved or cleared by the [place] FDA and  has been authorized for detection and/or diagnosis of SARS-CoV-2 by FDA under an Emergency Use Authorization (EUA). This EUA will remain  in effect (meaning this test can be used) for the duration of the COVID-19 declaration under Section 564(b)(1) of the Act, 21 U.S.C. section 360bbb-3(b)(1), unless the authorization is terminated or revoked sooner.    Influenza A by PCR NEGATIVE NEGATIVE Final   Influenza B by PCR NEGATIVE NEGATIVE Final    Comment: (NOTE) The Xpert Xpress SARS-CoV-2/FLU/RSV assay is intended as an aid in  the diagnosis of influenza from Nasopharyngeal swab specimens and  should not be used as a sole basis for treatment. Nasal washings and  aspirates are unacceptable for Xpert Xpress SARS-CoV-2/FLU/RSV  testing. Fact Sheet for Patients: PinkCheek.be Fact Sheet for Healthcare Providers: GravelBags.it This test is not yet approved or cleared by the [place] FDA and  has been authorized for detection and/or diagnosis of SARS-CoV-2 by  FDA under an Emergency Use Authorization (EUA). This EUA will remain  in effect (meaning this test can be used) for the duration of the  Covid-19 declaration under Section 564(b)(1) of the Act, 21  U.S.C. section 360bbb-3(b)(1), unless the authorization is  terminated or revoked. Performed at [facility], [address] [place], [place] [address]   MRSA PCR Screening     Status: None   Collection Time: 06/14/2019 10:35 PM   Specimen: Nasopharyngeal  Result Value Ref Range Status   MRSA by PCR NEGATIVE NEGATIVE Final    Comment:         The GeneXpert MRSA Assay (FDA approved for NASAL specimens only), is one component of a comprehensive MRSA colonization surveillance program. It is not intended to diagnose MRSA infection nor to guide or monitor treatment for MRSA infections. Performed at [facility], [address] [place], [place] [address]     Coagulation Studies: Recent Labs    05/30/2019 0001 06/11/2019 0429 06/02/19 0445  LABPROT 50.1* 56.9* 36.7*  INR 5.5* 6.5* 3.7*    Urinalysis: Recent Labs    06/22/2019 1454  COLORURINE AMBER*  LABSPEC 1.019  PHURINE 7.0  GLUCOSEU NEGATIVE  HGBUR NEGATIVE  BILIRUBINUR NEGATIVE  KETONESUR 5*  PROTEINUR >=300*  NITRITE NEGATIVE  LEUKOCYTESUR NEGATIVE      Imaging: EEG  Result Date: 06/09/2019 Lora Havens, MD     05/24/2019  5:22 PM Patient Name: BIBIANA GILLEAN MRN: [id number] Epilepsy Attending: Lora Havens Referring Physician/Provider: Darel Hong, NP Date: 06/04/2019 Duration: 24.67mns Patient history: 89yoF with ams. EEG to evaluate for seizure Level of alertness: comatose AEDs during EEG study: Librium, lorazepam Technical aspects: This EEG study was done with scalp electrodes positioned according to the 10-20 International system of electrode placement. Electrical activity was acquired at a sampling rate of 500Hz and reviewed with a high frequency filter of 70Hz and a low frequency filter of 1Hz. EEG data were recorded continuously and digitally stored. DESCRIPTION: EEG showed continuous generalized 3-5hz theta-delta slowing admixed with an excessive amount of 15 to 18 Hz, 2-3 uV beta activity with irregular morphology distributed symmetrically and diffusely. Hyperventilation and photic stimulation were not performed. Episode of left shoulder and feet and head twitching was recorded per eeg tech annotation ( difficult to visualize on video). Concomitant eeg before, during and after the event didn't show any eeg change to suggest seizure.  ABNORMALITY - Continuous slow, generalized - Excessive beta, generalized IMPRESSION: This study is suggestive of severe diffuse encephalopathy, non specific to etiology.  The excessive beta activity seen in the background is most likely due to the effect of benzodiazepine and is a benign EEG pattern. One episode of left shoulder, feet and head twitching was recorded without EEG change and was likely non epileptic. No definite seizures or epileptiform discharges were seen throughout the recording. Clinical correlation is recommended Priyanka O Yadav   CT ABDOMEN PELVIS WO CONTRAST  Result Date: 06/21/2019 CLINICAL DATA:  Abdominal rigidity EXAM: CT ABDOMEN AND PELVIS WITHOUT CONTRAST TECHNIQUE: Multidetector CT imaging of the abdomen and pelvis was performed following the standard protocol without IV  contrast. COMPARISON:  Ultrasound from 04/01/2019 FINDINGS: Lower chest: Small right pleural effusion is noted with bibasilar atelectasis right greater than left. No focal parenchymal nodules are seen. Gastric catheter is noted extending into the stomach. Hepatobiliary: Liver is well visualized and within normal limits. Gallbladder demonstrates some mild wall thickening likely reactive related to the ascites. Increased density is noted within consistent with cholelithiasis and gallbladder sludge similar to that noted on prior ultrasound. Pancreas: Unremarkable. No pancreatic ductal dilatation or surrounding inflammatory changes. Spleen: Normal in size without focal abnormality. Adrenals/Urinary Tract: Adrenal glands are within normal limits. Kidneys are well visualized bilaterally. No renal calculi or obstructive changes are seen. The bladder is decompressed by Foley catheter and obscured by bilateral hip replacements. Stomach/Bowel: Scattered diverticular change of the colon is noted without evidence of diverticulitis. No obstructive or inflammatory changes are seen. The appendix is within normal limits. Some  inflammatory changes are noted surrounding the second portion of the duodenum and head of the pancreas as well as extending into the mesentery inferiorly. This may be related to some focal duodenitis although may be reactive related to the mild ascites. Vascular/Lymphatic: Aortic atherosclerosis. No enlarged abdominal or pelvic lymph nodes. Reproductive: Status post hysterectomy. No adnexal masses. Other: No abdominal wall hernia or abnormality. No abdominopelvic ascites. Musculoskeletal: Bilateral hip replacements are noted. Degenerative changes of lumbar spine are seen. IMPRESSION: Small right pleural effusion with bibasilar atelectasis. Cholelithiasis and gallbladder sludge. Mild ascites. Inflammatory changes surrounding duodenum and head of the pancreas likely related to some duodenitis. No perforation or definitive ulcer crater is seen. These changes may be in part related to the underlying ascites as well. No other focal abnormality is noted. Electronically Signed   By: Inez Catalina M.D.   On: 05/24/2019 22:28   CT HEAD WO CONTRAST  Result Date: 06/03/2019 CLINICAL DATA:  Encephalopathy EXAM: CT HEAD WITHOUT CONTRAST TECHNIQUE: Contiguous axial images were obtained from the base of the skull through the vertex without intravenous contrast. COMPARISON:  06/13/2019 FINDINGS: Brain: There is no mass, hemorrhage or extra-axial collection. The size and configuration of the ventricles and extra-axial CSF spaces are normal. The brain parenchyma is normal, without acute or chronic infarction. Vascular: No abnormal hyperdensity of the major intracranial arteries or dural venous sinuses. No intracranial atherosclerosis. Skull: The visualized skull base, calvarium and extracranial soft tissues are normal. Sinuses/Orbits: No fluid levels or advanced mucosal thickening of the visualized paranasal sinuses. No mastoid or middle ear effusion. The orbits are normal. IMPRESSION: Normal head CT. Electronically Signed   By:  Ulyses Jarred M.D.   On: 05/29/2019 02:53   CT Head Wo Contrast  Result Date: 06/22/2019 CLINICAL DATA:  Altered level of consciousness. Confusion. Parkinson's. EXAM: CT HEAD WITHOUT CONTRAST TECHNIQUE: Contiguous axial images were obtained from the base of the skull through the vertex without intravenous contrast. COMPARISON:  April 13, 2019 FINDINGS: Brain: No subdural, epidural, or subarachnoid hemorrhage identified. Cerebellum, brainstem, and basal cisterns are normal. No mass effect or midline shift. No acute cortical ischemia or infarct identified. Vascular: No hyperdense vessel or unexpected calcification. Skull: Normal. Negative for fracture or focal lesion. Sinuses/Orbits: No acute finding. Other: None. IMPRESSION: 1. No acute intracranial abnormalities are identified. 2. Electronically Signed   By: Dorise Bullion III M.D   On: 06/13/2019 14:29   CARDIAC CATHETERIZATION  Result Date: 06/04/2019 Successful transvenous temporary pacemaker  US RENAL  Result Date: 06/23/2019 CLINICAL DATA:  AKI EXAM: RENAL / URINARY TRACT ULTRASOUND COMPLETE COMPARISON:  None. FINDINGS: Right Kidney: Renal measurements: 10.4 x 4.5 x 5.6 cm = volume: 136 mL . Echogenicity is increased. No mass or hydronephrosis visualized. Left Kidney: Renal measurements: 10.2 x 5.3 x 4.3 cm = volume: 123 mL. Echogenicity is increased. No mass or hydronephrosis visualized. Bladder: Decompressed by Foley catheter. Other: Cholelithiasis, sludge, mild gallbladder wall thickening. Mild perinephric fluid. Free fluid is present in the pelvis. IMPRESSION: No hydronephrosis. Increased renal echogenicity reflecting medical renal disease. Mild free fluid. Gallbladder stones, sludge, and wall thickening. Electronically Signed   By: Macy Mis M.D.   On: 06/08/2019 11:35   DG Chest Port 1 View  Result Date: 06/02/2019 CLINICAL DATA:  Acute respiratory failure. EXAM: PORTABLE CHEST 1 VIEW COMPARISON:  Radiograph 06/04/2019 FINDINGS:  Endotracheal tube tip 17 mm from the carina. Enteric tube tip and side-port below the diaphragm in the stomach. Right internal jugular central venous catheter tip in the upper SVC. Pacemaker from an inferior approach projects over the expected right ventricle. Increasing hazy opacity at the lung bases consistent with pleural effusions and atelectasis. Vascular congestion. No pneumothorax. IMPRESSION: 1. Worsening bilateral pleural effusions, right greater than left. 2. Vascular congestion. 3. Temporary pacemaker placement. Support apparatus otherwise unchanged. Electronically Signed   By: Keith Rake M.D.   On: 06/02/2019 05:08   DG Chest Port 1 View  Result Date: 05/26/2019 CLINICAL DATA:  Central line placement. EXAM: PORTABLE CHEST 1 VIEW COMPARISON:  Radiograph earlier this day. FINDINGS: Tip of the right internal jugular central venous catheter projects over the upper SVC. No visualized pneumothorax. Endotracheal tube and enteric tube remain in place. Borderline cardiomegaly. Unchanged vascular congestion. No new airspace disease. Streaky bibasilar atelectasis. IMPRESSION: 1. Tip of the right internal jugular central venous catheter projects over the upper SVC. No pneumothorax. 2. Unchanged vascular congestion. 3. Streaky bibasilar atelectasis. Electronically Signed   By: Keith Rake M.D.   On: 06/22/2019 00:19   DG Chest Portable 1 View  Result Date: 06/05/2019 CLINICAL DATA:  Respiratory distress. Endotracheal tube and OG tube placement. EXAM: PORTABLE CHEST 1 VIEW COMPARISON:  06/10/2019 FINDINGS: Endotracheal tube is noted with tip 2.8 cm above the carina. An OG tube is noted entering the stomach with tip just off of the field of view. This is a mildly low volume film. Cardiomegaly and mild pulmonary vascular congestion noted. No pneumothorax or pleural effusion. Defibrillator/pacing pads overlying the chest noted. No acute bony abnormalities are identified. IMPRESSION: 1. Endotracheal  tube with tip 2.8 cm above the carina. 2. OG tube entering the stomach with tip just off of the field of view. 3. Cardiomegaly with mild pulmonary vascular congestion. Electronically Signed   By: Margarette Canada M.D.   On: 05/30/2019 20:51   DG Chest Portable 1 View  Result Date: 06/09/2019 CLINICAL DATA:  Altered mental status. EXAM: PORTABLE CHEST 1 VIEW COMPARISON:  April 17, 2019. FINDINGS: The heart size and mediastinal contours are within normal limits. Both lungs are clear. The visualized skeletal structures are unremarkable. IMPRESSION: No active disease. Electronically Signed   By: Marijo Conception M.D.   On: 06/04/2019 14:02   DG Abd Portable 1 View  Result Date: 06/03/2019 CLINICAL DATA:  OG tube placement. EXAM: PORTABLE ABDOMEN - 1 VIEW COMPARISON:  None. FINDINGS: An OG tube is noted with tip overlying the mid stomach. Bowel gas pattern is unremarkable. IMPRESSION: 1. OG tube with tip overlying the mid stomach. 2. Unremarkable bowel gas pattern. Electronically Signed   By: Margarette Canada  M.D.   On: 05/24/2019 20:52   ECHOCARDIOGRAM COMPLETE  Result Date: 06/05/2019   ECHOCARDIOGRAM REPORT   Patient Name:   Matteson D Konz Date of Exam: 06/19/2019 Medical Rec #:  [id number]     Height:       65.0 in Accession #:    [id number]    Weight:       135.4 lb Date of Birth:  1955/09/16     BSA:          1.68 m Patient Age:    39 years      BP:           133/70 mmHg Patient Gender: F             HR:           70 bpm. Exam Location:  [facility] Procedure: 2D Echo, Color Doppler and Cardiac Doppler Indications:     R55 Syncope  History:         Patient has no prior history of Echocardiogram examinations.                  ETOH abuse.  Sonographer:     Charmayne Sheer RDCS (AE) Referring Phys:  [id number] TAWFIKUL ALAM Diagnosing Phys: Isaias Cowman MD  Sonographer Comments: Echo performed with patient supine and on artificial respirator. IMPRESSIONS  1. Left ventricular ejection fraction, by visual estimation, is 40 to  45%. The left ventricle has normal function. There is no left ventricular hypertrophy.  2. Mildly dilated left ventricular internal cavity size.  3. The left ventricle has no regional wall motion abnormalities.  4. Global right ventricle has normal systolic function.The right ventricular size is normal. No increase in right ventricular wall thickness.  5. Left atrial size was mild-moderately dilated.  6. Right atrial size was normal.  7. The mitral valve is normal in structure. Moderate mitral valve regurgitation. No evidence of mitral stenosis.  8. The tricuspid valve is normal in structure. Tricuspid valve regurgitation is not demonstrated.  9. The aortic valve is normal in structure. Aortic valve regurgitation is not visualized. No evidence of aortic valve sclerosis or stenosis. 10. The pulmonic valve was normal in structure. Pulmonic valve regurgitation is not visualized. 11. Moderately elevated pulmonary artery systolic pressure. 12. The inferior vena cava is normal in size with greater than 50% respiratory variability, suggesting right atrial pressure of 3 mmHg. FINDINGS  Left Ventricle: Left ventricular ejection fraction, by visual estimation, is 40 to 45%. The left ventricle has normal function. The left ventricle has no regional wall motion abnormalities. The left ventricular internal cavity size was mildly dilated left ventricle. There is no left ventricular hypertrophy. Normal left atrial pressure. Right Ventricle: The right ventricular size is normal. No increase in right ventricular wall thickness. Global RV systolic function is has normal systolic function. The tricuspid regurgitant velocity is 2.96 m/s, and with an assumed right atrial pressure  of 10 mmHg, the estimated right ventricular systolic pressure is moderately elevated at 45.2 mmHg. Left Atrium: Left atrial size was mild-moderately dilated. Right Atrium: Right atrial size was normal in size Pericardium: There is no evidence of pericardial  effusion. Mitral Valve: The mitral valve is normal in structure. Moderate mitral valve regurgitation. No evidence of mitral valve stenosis by observation. MV peak gradient, 6.2 mmHg. Tricuspid Valve: The tricuspid valve is normal in structure. Tricuspid valve regurgitation is not demonstrated. Aortic Valve: The aortic valve is normal in structure. Aortic valve regurgitation is not visualized. The  aortic valve is structurally normal, with no evidence of sclerosis or stenosis. Aortic valve mean gradient measures 2.0 mmHg. Aortic valve peak gradient measures 3.7 mmHg. Aortic valve area, by VTI measures 1.32 cm. Pulmonic Valve: The pulmonic valve was normal in structure. Pulmonic valve regurgitation is not visualized. Pulmonic regurgitation is not visualized. Aorta: The aortic root, ascending aorta and aortic arch are all structurally normal, with no evidence of dilitation or obstruction. Venous: The inferior vena cava is normal in size with greater than 50% respiratory variability, suggesting right atrial pressure of 3 mmHg. IAS/Shunts: No atrial level shunt detected by color flow Doppler. There is no evidence of a patent foramen ovale. No ventricular septal defect is seen or detected. There is no evidence of an atrial septal defect.  LEFT VENTRICLE PLAX 2D LVIDd:         4.94 cm LVIDs:         4.14 cm LV PW:         0.84 cm LV IVS:        0.65 cm LVOT diam:     1.80 cm LV SV:         39 ml LV SV Index:   23.20 LVOT Area:     2.54 cm  LV Volumes (MOD) LV area d, A2C:    20.50 cm LV area d, A4C:    20.50 cm LV area s, A2C:    14.00 cm LV area s, A4C:    15.60 cm LV major d, A2C:   6.47 cm LV major d, A4C:   5.89 cm LV major s, A2C:   5.74 cm LV major s, A4C:   5.62 cm LV vol d, MOD A2C: 53.7 ml LV vol d, MOD A4C: 57.7 ml LV vol s, MOD A2C: 28.2 ml LV vol s, MOD A4C: 36.1 ml LV SV MOD A2C:     25.5 ml LV SV MOD A4C:     57.7 ml LV SV MOD BP:      26.1 ml RIGHT VENTRICLE RV Basal diam:  3.13 cm LEFT ATRIUM              Index       RIGHT ATRIUM           Index LA diam:        4.20 cm 2.51 cm/m  RA Area:     17.40 cm LA Vol (A2C):   80.6 ml 48.10 ml/m RA Volume:   48.40 ml  28.88 ml/m LA Vol (A4C):   78.9 ml 47.08 ml/m LA Biplane Vol: 82.7 ml 49.35 ml/m  AORTIC VALVE                   PULMONIC VALVE AV Area (Vmax):    1.51 cm    PV Vmax:       0.75 m/s AV Area (Vmean):   1.39 cm    PV Vmean:      48.300 cm/s AV Area (VTI):     1.32 cm    PV VTI:        0.117 m AV Vmax:           95.70 cm/s  PV Peak grad:  2.3 mmHg AV Vmean:          62.900 cm/s PV Mean grad:  1.0 mmHg AV VTI:            0.162 m AV Peak Grad:      3.7 mmHg AV Mean Grad:  2.0 mmHg LVOT Vmax:         56.90 cm/s LVOT Vmean:        34.400 cm/s LVOT VTI:          0.084 m LVOT/AV VTI ratio: 0.52  AORTA Ao Root diam: 2.80 cm MITRAL VALVE                         TRICUSPID VALVE MV Area (PHT): 4.89 cm              TR Peak grad:   35.2 mmHg MV Peak grad:  6.2 mmHg              TR Vmax:        304.00 cm/s MV Mean grad:  2.0 mmHg MV Vmax:       1.24 m/s              SHUNTS MV Vmean:      65.6 cm/s             Systemic VTI:  0.08 m MV VTI:        0.29 m                Systemic Diam: 1.80 cm MV PHT:        44.95 msec MV Decel Time: 155 msec MV E velocity: 107.00 cm/s 103 cm/s MV A velocity: 39.80 cm/s  70.3 cm/s MV E/A ratio:  2.69        1.5  Isaias Cowman MD Electronically signed by Isaias Cowman MD Signature Date/Time: 06/22/2019/1:40:24 PM    Final      Medications:   . sodium chloride 50 mL/hr at 06/02/19 0400  . acetylcysteine 15 mg/kg/hr (06/02/19 0400)  . dexmedetomidine (PRECEDEX) IV infusion Stopped (06/10/2019 2258)  . norepinephrine (LEVOPHED) Adult infusion Stopped (06/21/2019 0045)  . pantoprazole (PROTONIX) IVPB    . pantoprozole (PROTONIX) infusion 8 mg/hr (06/02/19 0759)  . piperacillin-tazobactam (ZOSYN)  IV 3.375 g (06/02/19 0059)   . sodium chloride   Intravenous Once  . chlordiazePOXIDE  25 mg Oral TID  . chlorhexidine  gluconate (MEDLINE KIT)  15 mL Mouth Rinse BID  . Chlorhexidine Gluconate Cloth  6 each Topical Daily  . insulin aspart  0-6 Units Subcutaneous Q6H  . ipratropium-albuterol  3 mL Nebulization Q6H  . lactulose  300 mL Rectal Daily  . LORazepam  1 mg Intravenous Once  . mouth rinse  15 mL Mouth Rinse 10 times per day  . [START ON 06/04/2019] pantoprazole  40 mg Intravenous Q12H  . sodium chloride flush  3 mL Intravenous Q12H  . venlafaxine XR  300 mg Oral Q breakfast   acetaminophen **OR** acetaminophen, albuterol, atropine, fentaNYL (SUBLIMAZE) injection, fentaNYL (SUBLIMAZE) injection, hydrALAZINE, ondansetron **OR** ondansetron (ZOFRAN) IV, senna-docusate  Assessment/ Plan:  63 y.o. female  with Parkinson's, alcohol abuse, anxiety, bipolar disorder, amputation, history of left hip arthroplasty , right hip replacement, back surgery was admitted on 06/15/2019 with altered mental status, Acute resp failure, coagulopathy and arrhythmia/bradycardia.   #Acute kidney injury. Baseline creatinine of 0.85 on May 20, 2019 #Proteinuria #Rhabdomyolysis #Atrial fibrillation with bradycardia, requiring temporary pacemaker placement #Acute respiratory failure.  Intubated in ER December 8 #Acidosis #Altered mental status #GI bleed, coagulopathy #Hyperglycemia.  Hemoglobin A1c 5.2% June 01, 2019 # Pulm HTN echo 06/07/2019. LVEF 40-45% # UTI, GNR  #Shock liver  Kidney injury is likely secondary to ATN from hemodynamic instability.  Patient is currently  oligoanuric.  Electrolytes and volume status are acceptable.  No acute indication for dialysis but will continue to monitor daily.   Obtain protein/cr ratio  Temporary pacemaker for bradycardia and atrial fibrillation, cardiology team is following  GI bleed with coagulopathy, reported history of alcohol abuse. Most recent hemoglobin is 14.7 and is being closely monitored. INR 6.5 ->3.7 CT abdomen does not report cirrhosis but ascites and  cholelithiasis and gallbladder sludge is noted. Broad spectrum Abx for sepsis as per icu team. GNR in urine/    LOS: [place] 12/10/20209:05 AM  [place], [place]  Note: This note was prepared with Dragon dictation. Any transcription errors are unintentional

## 2019-06-02 NOTE — Progress Notes (Signed)
Patient clinically unchanged, tele reveals atrial fib, rate controlled 70 bpm, temp pacer reset to HR 45 bpm, sensitivity set to 1.5 mv. Will likely remove temp pacer in am.

## 2019-06-02 NOTE — Consult Note (Signed)
PHARMACY CONSULT NOTE - FOLLOW UP  Pharmacy Consult for Electrolyte Monitoring and Replacement   Recent Labs: Potassium (mmol/L)  Date Value  06/02/2019 3.6  10/11/2014 4.1   Magnesium (mg/dL)  Date Value  03/31/2019 1.9   Calcium (mg/dL)  Date Value  06/02/2019 6.6 (L)   Calcium, Total (mg/dL)  Date Value  10/11/2014 9.1   Albumin (g/dL)  Date Value  06/02/2019 2.6 (L)  10/11/2014 4.3   Phosphorus (mg/dL)  Date Value  03/31/2019 4.2   Sodium (mmol/L)  Date Value  06/02/2019 147 (H)  10/11/2014 53   63 year old female admitted with GI bleed on protonix gtt with PMH of neuropathy, parkinson's syndrome, UTI, bipolar, cancer, and history of recurrent falls. Patient is intubated, but currently NPO.  Patient is no longer on lactulose.  Electrolytes Corrected Ca 7.3 K 3.6, but trending down quickly. All other electrolytes wnl Goal:  Electrolytes wnl. Start D5 with KCl 20 mEq @ 50 mL/h. Pharmacy will monitor with AM labs, and order Mag.  Glucose Currently NPO. Patient was hypoglycemic on admission. BG<100 SSI Q6 h A1c 5.2  Constipation LBM unknown (admit to ICU 12/8) Senokot-S  Gerald Dexter, PharmD Pharmacy Resident  06/02/2019 2:14 PM

## 2019-06-02 NOTE — Progress Notes (Signed)
[facility] Gastroenterology Inpatient Progress Note  Subjective: Patient seen for f/u UGI bleed, coagulopathy, elevated liver enzymes, rhabdo. AST primarily elevation. No recurrent bleeding, "dark old blood" in OG tube. No acute hemodynamic events. Neuro status has deteriorated. Treatment of Sepsis and AKI continue. Family (Son and daughter) at bedside. They have questions about kidney and lung function.  According to RN, discussion of comfort care ONLY option has occurred.  Objective: Vital signs in last 24 hours: Temp:  [99 F (37.2 C)-100.6 F (38.1 C)] 99 F (37.2 C) (12/10 0800) Pulse Rate:  [57-75] 57 (12/10 0800) Resp:  [0-22] 18 (12/10 0800) BP: (102-189)/(50-166) 109/53 (12/10 0800) SpO2:  [95 %-100 %] 100 % (12/10 1201) FiO2 (%):  [40 %-60 %] 40 % (12/10 1201) Weight:  [62.8 kg] 62.8 kg (12/10 0422) Blood pressure (!) 109/53, pulse (!) 57, temperature 99 F (37.2 C), temperature source Oral, resp. rate 18, height _0  (1.651 m), weight 62.8 kg, SpO2 100 %.    Intake/Output from previous day: 12/09 0701 - 12/10 0700 In: 4405.1 [I.V.:3753.1; Blood:452; IV Piggyback:200] Out: 15 [Urine:15]  Intake/Output this shift: No intake/output data recorded.   General appearance:  Sedated, opens eyes spontaneously. Does not follow commands. Resp:  Coarse Rhonchi bilaterally. Cardio:  RRR GI:  Soft, mildly distended. NO masses or tenderness. Extremities: 2+ edema.   Lab Results: Results for orders placed or performed during the hospital encounter of 06/04/2019 (from the past 24 hour(s))  Hemoglobin and hematocrit, blood     Status: None   Collection Time: 06/08/2019  4:02 PM  Result Value Ref Range   Hemoglobin 13.3 12.0 - 15.0 g/dL   HCT 40.5 36.0 - 46.0 %  Lactic acid, plasma     Status: Abnormal   Collection Time: 06/15/2019  4:02 PM  Result Value Ref Range   Lactic Acid, Venous 6.8 (HH) 0.5 - 1.9 mmol/L  Hepatitis panel, acute     Status: None   Collection Time:  06/11/2019  4:02 PM  Result Value Ref Range   Hepatitis B Surface Ag NON REACTIVE NON REACTIVE   HCV Ab NON REACTIVE NON REACTIVE   Hep A IgM NON REACTIVE NON REACTIVE   Hep B C IgM NON REACTIVE NON REACTIVE  Acetaminophen level     Status: Abnormal   Collection Time: 06/05/2019  4:02 PM  Result Value Ref Range   Acetaminophen (Tylenol), Serum <10 (L) 10 - 30 ug/mL  Glucose, capillary     Status: Abnormal   Collection Time: 05/27/2019  6:26 PM  Result Value Ref Range   Glucose-Capillary 225 (H) 70 - 99 mg/dL  Glucose, capillary     Status: Abnormal   Collection Time: 06/12/2019  8:09 PM  Result Value Ref Range   Glucose-Capillary 176 (H) 70 - 99 mg/dL  Glucose, capillary     Status: Abnormal   Collection Time: 06/05/2019 11:56 PM  Result Value Ref Range   Glucose-Capillary 125 (H) 70 - 99 mg/dL  Procalcitonin     Status: None   Collection Time: 06/02/19  4:45 AM  Result Value Ref Range   Procalcitonin 1.70 ng/mL  Ammonia     Status: Abnormal   Collection Time: 06/02/19  4:45 AM  Result Value Ref Range   Ammonia 39 (H) 9 - 35 umol/L  CBC with Differential/Platelet     Status: Abnormal   Collection Time: 06/02/19  4:45 AM  Result Value Ref Range   WBC 20.8 (H) 4.0 - 10.5 K/uL  RBC 5.13 (H) 3.87 - 5.11 MIL/uL   Hemoglobin 14.7 12.0 - 15.0 g/dL   HCT 44.8 36.0 - 46.0 %   MCV 87.3 80.0 - 100.0 fL   MCH 28.7 26.0 - 34.0 pg   MCHC 32.8 30.0 - 36.0 g/dL   RDW 17.2 (H) 11.5 - 15.5 %   Platelets 167 150 - 400 K/uL   nRBC 12.1 (H) 0.0 - 0.2 %   Neutrophils Relative % 79 %   Neutro Abs 16.5 (H) 1.7 - 7.7 K/uL   Lymphocytes Relative 9 %   Lymphs Abs 1.8 0.7 - 4.0 K/uL   Monocytes Relative 5 %   Monocytes Absolute 1.1 (H) 0.1 - 1.0 K/uL   Eosinophils Relative 1 %   Eosinophils Absolute 0.2 0.0 - 0.5 K/uL   Basophils Relative 1 %   Basophils Absolute 0.2 (H) 0.0 - 0.1 K/uL   WBC Morphology MORPHOLOGY UNREMARKABLE    RBC Morphology MIXED RBC POPULATION    Smear Review Normal platelet  morphology    Immature Granulocytes 5 %   Abs Immature Granulocytes 1.05 (H) 0.00 - 0.07 K/uL   Polychromasia PRESENT   Comprehensive metabolic panel     Status: Abnormal   Collection Time: 06/02/19  4:45 AM  Result Value Ref Range   Sodium 147 (H) 135 - 145 mmol/L   Potassium 3.6 3.5 - 5.1 mmol/L   Chloride 102 98 - 111 mmol/L   CO2 24 22 - 32 mmol/L   Glucose, Bld 116 (H) 70 - 99 mg/dL   BUN 71 (H) 8 - 23 mg/dL   Creatinine, Ser 2.72 (H) 0.44 - 1.00 mg/dL   Calcium 6.6 (L) 8.9 - 10.3 mg/dL   Total Protein 5.0 (L) 6.5 - 8.1 g/dL   Albumin 2.6 (L) 3.5 - 5.0 g/dL   AST 3,731 (H) 15 - 41 U/L   ALT 191 (H) 0 - 44 U/L   Alkaline Phosphatase 126 38 - 126 U/L   Total Bilirubin 5.4 (H) 0.3 - 1.2 mg/dL   GFR calc non Af Amer 18 (L) >60 mL/min   GFR calc Af Amer 20 (L) >60 mL/min   Anion gap 21 (H) 5 - 15  Blood gas, arterial     Status: Abnormal   Collection Time: 06/02/19  4:45 AM  Result Value Ref Range   FIO2 0.50    Delivery systems VENTILATOR    Mode PRESSURE REGULATED VOLUME CONTROL    VT 400 mL   LHR 16 resp/min   Peep/cpap 5.0 cm H20   pH, Arterial 7.32 (L) 7.350 - 7.450   pCO2 arterial 45 32.0 - 48.0 mmHg   pO2, Arterial 82 (L) 83.0 - 108.0 mmHg   Bicarbonate 23.2 20.0 - 28.0 mmol/L   Acid-base deficit 3.1 (H) 0.0 - 2.0 mmol/L   O2 Saturation 95.0 %   Patient temperature 37.0    Collection site RIGHT RADIAL    Sample type ARTERIAL DRAW    Allens test (pass/fail) PASS PASS  Protime-INR     Status: Abnormal   Collection Time: 06/02/19  4:45 AM  Result Value Ref Range   Prothrombin Time 36.7 (H) 11.4 - 15.2 seconds   INR 3.7 (H) 0.8 - 1.2  Pathologist smear review     Status: None   Collection Time: 06/02/19  4:45 AM  Result Value Ref Range   Path Review Blood smear is reviewed.   Glucose, capillary     Status: None   Collection Time: 06/02/19  6:28 AM  Result Value Ref Range   Glucose-Capillary 95 70 - 99 mg/dL  Hemoglobin and hematocrit, blood     Status: None    Collection Time: 06/02/19 10:05 AM  Result Value Ref Range   Hemoglobin 13.7 12.0 - 15.0 g/dL   HCT 42.1 36.0 - 46.0 %  Glucose, capillary     Status: None   Collection Time: 06/02/19 12:31 PM  Result Value Ref Range   Glucose-Capillary 73 70 - 99 mg/dL  Glucose, capillary     Status: None   Collection Time: 06/02/19 12:57 PM  Result Value Ref Range   Glucose-Capillary 82 70 - 99 mg/dL     Recent Labs    05/29/2019 2049 06/16/2019 0429 05/29/2019 1602 06/02/19 0445 06/02/19 1005  WBC 23.1* 27.4*  --  20.8*  --   HGB 11.2* 14.3 13.3 14.7 13.7  HCT 37.3 45.0 40.5 44.8 42.1  PLT 244 158  --  167  --    BMET Recent Labs    06/10/2019 0001 06/21/2019 0429 06/02/19 0445  NA 137 139 147*  K 4.8 4.0 3.6  CL 102 104 102  CO2 15* 18* 24  GLUCOSE 224* 291* 116*  BUN 62* 60* 71*  CREATININE 2.02* 1.90* 2.72*  CALCIUM 7.4* 6.8* 6.6*   LFT Recent Labs    06/02/19 0445  PROT 5.0*  ALBUMIN 2.6*  AST 3,731*  ALT 191*  ALKPHOS 126  BILITOT 5.4*   PT/INR Recent Labs    05/26/2019 0429 06/02/19 0445  LABPROT 56.9* 36.7*  INR 6.5* 3.7*   Hepatitis Panel Recent Labs    06/21/2019 1602  HEPBSAG NON REACTIVE  HCVAB NON REACTIVE  HEPAIGM NON REACTIVE  HEPBIGM NON REACTIVE   C-Diff No results for input(s): CDIFFTOX in the last 72 hours. No results for input(s): CDIFFPCR in the last 72 hours.   Studies/Results: EEG  Result Date: 06/20/2019 Lora Havens, MD     06/05/2019  5:22 PM Patient Name: [name] MRN: [id number] Epilepsy Attending: Lora Havens Referring Physician/Provider: Darel Hong, NP Date: 05/28/2019 Duration: 24.38mns Patient history: 89yoF with ams. EEG to evaluate for seizure Level of alertness: comatose AEDs during EEG study: Librium, lorazepam Technical aspects: This EEG study was done with scalp electrodes positioned according to the 10-20 International system of electrode placement. Electrical activity was acquired at a sampling rate of _0   and reviewed with a high frequency filter of _1  and a low frequency filter of _2 . EEG data were recorded continuously and digitally stored. DESCRIPTION: EEG showed continuous generalized 3-_3  theta-delta slowing admixed with an excessive amount of 15 to 18 Hz, 2-3 uV beta activity with irregular morphology distributed symmetrically and diffusely. Hyperventilation and photic stimulation were not performed. Episode of left shoulder and feet and head twitching was recorded per eeg tech annotation ( difficult to visualize on video). Concomitant eeg before, during and after the event didn't show any eeg change to suggest seizure. ABNORMALITY - Continuous slow, generalized - Excessive beta, generalized IMPRESSION: This study is suggestive of severe diffuse encephalopathy, non specific to etiology.  The excessive beta activity seen in the background is most likely due to the effect of benzodiazepine and is a benign EEG pattern. One episode of left shoulder, feet and head twitching was recorded without EEG change and was likely non epileptic. No definite seizures or epileptiform discharges were seen throughout the recording. Clinical correlation is recommended Priyanka OBarbra Sarks  CT ABDOMEN PELVIS WO CONTRAST  Result Date: 06/14/2019 CLINICAL DATA:  Abdominal rigidity EXAM: CT ABDOMEN AND PELVIS WITHOUT CONTRAST TECHNIQUE: Multidetector CT imaging of the abdomen and pelvis was performed following the standard protocol without IV contrast. COMPARISON:  Ultrasound from 04/01/2019 FINDINGS: Lower chest: Small right pleural effusion is noted with bibasilar atelectasis right greater than left. No focal parenchymal nodules are seen. Gastric catheter is noted extending into the stomach. Hepatobiliary: Liver is well visualized and within normal limits. Gallbladder demonstrates some mild wall thickening likely reactive related to the ascites. Increased density is noted within consistent with cholelithiasis and gallbladder  sludge similar to that noted on prior ultrasound. Pancreas: Unremarkable. No pancreatic ductal dilatation or surrounding inflammatory changes. Spleen: Normal in size without focal abnormality. Adrenals/Urinary Tract: Adrenal glands are within normal limits. Kidneys are well visualized bilaterally. No renal calculi or obstructive changes are seen. The bladder is decompressed by Foley catheter and obscured by bilateral hip replacements. Stomach/Bowel: Scattered diverticular change of the colon is noted without evidence of diverticulitis. No obstructive or inflammatory changes are seen. The appendix is within normal limits. Some inflammatory changes are noted surrounding the second portion of the duodenum and head of the pancreas as well as extending into the mesentery inferiorly. This may be related to some focal duodenitis although may be reactive related to the mild ascites. Vascular/Lymphatic: Aortic atherosclerosis. No enlarged abdominal or pelvic lymph nodes. Reproductive: Status post hysterectomy. No adnexal masses. Other: No abdominal wall hernia or abnormality. No abdominopelvic ascites. Musculoskeletal: Bilateral hip replacements are noted. Degenerative changes of lumbar spine are seen. IMPRESSION: Small right pleural effusion with bibasilar atelectasis. Cholelithiasis and gallbladder sludge. Mild ascites. Inflammatory changes surrounding duodenum and head of the pancreas likely related to some duodenitis. No perforation or definitive ulcer crater is seen. These changes may be in part related to the underlying ascites as well. No other focal abnormality is noted. Electronically Signed   By: Inez Catalina M.D.   On: 05/25/2019 22:28   CT HEAD WO CONTRAST  Result Date: 06/16/2019 CLINICAL DATA:  Encephalopathy EXAM: CT HEAD WITHOUT CONTRAST TECHNIQUE: Contiguous axial images were obtained from the base of the skull through the vertex without intravenous contrast. COMPARISON:  05/25/2019 FINDINGS: Brain:  There is no mass, hemorrhage or extra-axial collection. The size and configuration of the ventricles and extra-axial CSF spaces are normal. The brain parenchyma is normal, without acute or chronic infarction. Vascular: No abnormal hyperdensity of the major intracranial arteries or dural venous sinuses. No intracranial atherosclerosis. Skull: The visualized skull base, calvarium and extracranial soft tissues are normal. Sinuses/Orbits: No fluid levels or advanced mucosal thickening of the visualized paranasal sinuses. No mastoid or middle ear effusion. The orbits are normal. IMPRESSION: Normal head CT. Electronically Signed   By: Ulyses Jarred M.D.   On: 05/24/2019 02:53   CT Head Wo Contrast  Result Date: 05/29/2019 CLINICAL DATA:  Altered level of consciousness. Confusion. Parkinson's. EXAM: CT HEAD WITHOUT CONTRAST TECHNIQUE: Contiguous axial images were obtained from the base of the skull through the vertex without intravenous contrast. COMPARISON:  April 13, 2019 FINDINGS: Brain: No subdural, epidural, or subarachnoid hemorrhage identified. Cerebellum, brainstem, and basal cisterns are normal. No mass effect or midline shift. No acute cortical ischemia or infarct identified. Vascular: No hyperdense vessel or unexpected calcification. Skull: Normal. Negative for fracture or focal lesion. Sinuses/Orbits: No acute finding. Other: None. IMPRESSION: 1. No acute intracranial abnormalities are identified. 2. Electronically Signed   By: Dorise Bullion III M.D   On: 06/19/2019 14:29  CARDIAC CATHETERIZATION  Result Date: 06/03/2019 Successful transvenous temporary pacemaker  US RENAL  Result Date: 06/09/2019 CLINICAL DATA:  AKI EXAM: RENAL / URINARY TRACT ULTRASOUND COMPLETE COMPARISON:  None. FINDINGS: Right Kidney: Renal measurements: 10.4 x 4.5 x 5.6 cm = volume: 136 mL . Echogenicity is increased. No mass or hydronephrosis visualized. Left Kidney: Renal measurements: 10.2 x 5.3 x 4.3 cm = volume: 123  mL. Echogenicity is increased. No mass or hydronephrosis visualized. Bladder: Decompressed by Foley catheter. Other: Cholelithiasis, sludge, mild gallbladder wall thickening. Mild perinephric fluid. Free fluid is present in the pelvis. IMPRESSION: No hydronephrosis. Increased renal echogenicity reflecting medical renal disease. Mild free fluid. Gallbladder stones, sludge, and wall thickening. Electronically Signed   By: Macy Mis M.D.   On: 06/18/2019 11:35   DG Chest Port 1 View  Result Date: 06/02/2019 CLINICAL DATA:  Acute respiratory failure. EXAM: PORTABLE CHEST 1 VIEW COMPARISON:  Radiograph 05/29/2019 FINDINGS: Endotracheal tube tip 17 mm from the carina. Enteric tube tip and side-port below the diaphragm in the stomach. Right internal jugular central venous catheter tip in the upper SVC. Pacemaker from an inferior approach projects over the expected right ventricle. Increasing hazy opacity at the lung bases consistent with pleural effusions and atelectasis. Vascular congestion. No pneumothorax. IMPRESSION: 1. Worsening bilateral pleural effusions, right greater than left. 2. Vascular congestion. 3. Temporary pacemaker placement. Support apparatus otherwise unchanged. Electronically Signed   By: Keith Rake M.D.   On: 06/02/2019 05:08   DG Chest Port 1 View  Result Date: 06/04/2019 CLINICAL DATA:  Central line placement. EXAM: PORTABLE CHEST 1 VIEW COMPARISON:  Radiograph earlier this day. FINDINGS: Tip of the right internal jugular central venous catheter projects over the upper SVC. No visualized pneumothorax. Endotracheal tube and enteric tube remain in place. Borderline cardiomegaly. Unchanged vascular congestion. No new airspace disease. Streaky bibasilar atelectasis. IMPRESSION: 1. Tip of the right internal jugular central venous catheter projects over the upper SVC. No pneumothorax. 2. Unchanged vascular congestion. 3. Streaky bibasilar atelectasis. Electronically Signed   By:  Keith Rake M.D.   On: 05/27/2019 00:19   DG Chest Portable 1 View  Result Date: 06/04/2019 CLINICAL DATA:  Respiratory distress. Endotracheal tube and OG tube placement. EXAM: PORTABLE CHEST 1 VIEW COMPARISON:  06/05/2019 FINDINGS: Endotracheal tube is noted with tip 2.8 cm above the carina. An OG tube is noted entering the stomach with tip just off of the field of view. This is a mildly low volume film. Cardiomegaly and mild pulmonary vascular congestion noted. No pneumothorax or pleural effusion. Defibrillator/pacing pads overlying the chest noted. No acute bony abnormalities are identified. IMPRESSION: 1. Endotracheal tube with tip 2.8 cm above the carina. 2. OG tube entering the stomach with tip just off of the field of view. 3. Cardiomegaly with mild pulmonary vascular congestion. Electronically Signed   By: Margarette Canada M.D.   On: 06/23/2019 20:51   DG Abd Portable 1 View  Result Date: 06/18/2019 CLINICAL DATA:  OG tube placement. EXAM: PORTABLE ABDOMEN - 1 VIEW COMPARISON:  None. FINDINGS: An OG tube is noted with tip overlying the mid stomach. Bowel gas pattern is unremarkable. IMPRESSION: 1. OG tube with tip overlying the mid stomach. 2. Unremarkable bowel gas pattern. Electronically Signed   By: Margarette Canada M.D.   On: 06/11/2019 20:52   ECHOCARDIOGRAM COMPLETE  Result Date: 05/26/2019   ECHOCARDIOGRAM REPORT   Patient Name:   Lissy D Briner Date of Exam: 05/24/2019 Medical Rec #:  [id number]  Height:       65.0 in Accession #:    [id number]    Weight:       135.4 lb Date of Birth:  [birth year]     BSA:          1.68 m Patient Age:    63 years      BP:           133/70 mmHg Patient Gender: F             HR:           70 bpm. Exam Location:  [facility] Procedure: 2D Echo, Color Doppler and Cardiac Doppler Indications:     R55 Syncope  History:         Patient has no prior history of Echocardiogram examinations.                  ETOH abuse.  Sonographer:     Charmayne Sheer RDCS (AE) Referring Phys:   [id number] TAWFIKUL ALAM Diagnosing Phys: Isaias Cowman MD  Sonographer Comments: Echo performed with patient supine and on artificial respirator. IMPRESSIONS  1. Left ventricular ejection fraction, by visual estimation, is 40 to 45%. The left ventricle has normal function. There is no left ventricular hypertrophy.  2. Mildly dilated left ventricular internal cavity size.  3. The left ventricle has no regional wall motion abnormalities.  4. Global right ventricle has normal systolic function.The right ventricular size is normal. No increase in right ventricular wall thickness.  5. Left atrial size was mild-moderately dilated.  6. Right atrial size was normal.  7. The mitral valve is normal in structure. Moderate mitral valve regurgitation. No evidence of mitral stenosis.  8. The tricuspid valve is normal in structure. Tricuspid valve regurgitation is not demonstrated.  9. The aortic valve is normal in structure. Aortic valve regurgitation is not visualized. No evidence of aortic valve sclerosis or stenosis. 10. The pulmonic valve was normal in structure. Pulmonic valve regurgitation is not visualized. 11. Moderately elevated pulmonary artery systolic pressure. 12. The inferior vena cava is normal in size with greater than 50% respiratory variability, suggesting right atrial pressure of 3 mmHg. FINDINGS  Left Ventricle: Left ventricular ejection fraction, by visual estimation, is 40 to 45%. The left ventricle has normal function. The left ventricle has no regional wall motion abnormalities. The left ventricular internal cavity size was mildly dilated left ventricle. There is no left ventricular hypertrophy. Normal left atrial pressure. Right Ventricle: The right ventricular size is normal. No increase in right ventricular wall thickness. Global RV systolic function is has normal systolic function. The tricuspid regurgitant velocity is 2.96 m/s, and with an assumed right atrial pressure  of 10 mmHg, the estimated  right ventricular systolic pressure is moderately elevated at 45.2 mmHg. Left Atrium: Left atrial size was mild-moderately dilated. Right Atrium: Right atrial size was normal in size Pericardium: There is no evidence of pericardial effusion. Mitral Valve: The mitral valve is normal in structure. Moderate mitral valve regurgitation. No evidence of mitral valve stenosis by observation. MV peak gradient, 6.2 mmHg. Tricuspid Valve: The tricuspid valve is normal in structure. Tricuspid valve regurgitation is not demonstrated. Aortic Valve: The aortic valve is normal in structure. Aortic valve regurgitation is not visualized. The aortic valve is structurally normal, with no evidence of sclerosis or stenosis. Aortic valve mean gradient measures 2.0 mmHg. Aortic valve peak gradient measures 3.7 mmHg. Aortic valve area, by VTI measures 1.32 cm. Pulmonic Valve: The pulmonic valve was  normal in structure. Pulmonic valve regurgitation is not visualized. Pulmonic regurgitation is not visualized. Aorta: The aortic root, ascending aorta and aortic arch are all structurally normal, with no evidence of dilitation or obstruction. Venous: The inferior vena cava is normal in size with greater than 50% respiratory variability, suggesting right atrial pressure of 3 mmHg. IAS/Shunts: No atrial level shunt detected by color flow Doppler. There is no evidence of a patent foramen ovale. No ventricular septal defect is seen or detected. There is no evidence of an atrial septal defect.  LEFT VENTRICLE PLAX 2D LVIDd:         4.94 cm LVIDs:         4.14 cm LV PW:         0.84 cm LV IVS:        0.65 cm LVOT diam:     1.80 cm LV SV:         39 ml LV SV Index:   23.20 LVOT Area:     2.54 cm  LV Volumes (MOD) LV area d, A2C:    20.50 cm LV area d, A4C:    20.50 cm LV area s, A2C:    14.00 cm LV area s, A4C:    15.60 cm LV major d, A2C:   6.47 cm LV major d, A4C:   5.89 cm LV major s, A2C:   5.74 cm LV major s, A4C:   5.62 cm LV vol d, MOD A2C:  53.7 ml LV vol d, MOD A4C: 57.7 ml LV vol s, MOD A2C: 28.2 ml LV vol s, MOD A4C: 36.1 ml LV SV MOD A2C:     25.5 ml LV SV MOD A4C:     57.7 ml LV SV MOD BP:      26.1 ml RIGHT VENTRICLE RV Basal diam:  3.13 cm LEFT ATRIUM             Index       RIGHT ATRIUM           Index LA diam:        4.20 cm 2.51 cm/m  RA Area:     17.40 cm LA Vol (A2C):   80.6 ml 48.10 ml/m RA Volume:   48.40 ml  28.88 ml/m LA Vol (A4C):   78.9 ml 47.08 ml/m LA Biplane Vol: 82.7 ml 49.35 ml/m  AORTIC VALVE                   PULMONIC VALVE AV Area (Vmax):    1.51 cm    PV Vmax:       0.75 m/s AV Area (Vmean):   1.39 cm    PV Vmean:      48.300 cm/s AV Area (VTI):     1.32 cm    PV VTI:        0.117 m AV Vmax:           95.70 cm/s  PV Peak grad:  2.3 mmHg AV Vmean:          62.900 cm/s PV Mean grad:  1.0 mmHg AV VTI:            0.162 m AV Peak Grad:      3.7 mmHg AV Mean Grad:      2.0 mmHg LVOT Vmax:         56.90 cm/s LVOT Vmean:        34.400 cm/s LVOT VTI:  0.084 m LVOT/AV VTI ratio: 0.52  AORTA Ao Root diam: 2.80 cm MITRAL VALVE                         TRICUSPID VALVE MV Area (PHT): 4.89 cm              TR Peak grad:   35.2 mmHg MV Peak grad:  6.2 mmHg              TR Vmax:        304.00 cm/s MV Mean grad:  2.0 mmHg MV Vmax:       1.24 m/s              SHUNTS MV Vmean:      65.6 cm/s             Systemic VTI:  0.08 m MV VTI:        0.29 m                Systemic Diam: 1.80 cm MV PHT:        44.95 msec MV Decel Time: 155 msec MV E velocity: 107.00 cm/s 103 cm/s MV A velocity: 39.80 cm/s  70.3 cm/s MV E/A ratio:  2.69        1.5  Isaias Cowman MD Electronically signed by Isaias Cowman MD Signature Date/Time: 06/02/2019/1:40:24 PM    Final     Scheduled Inpatient Medications:   . sodium chloride   Intravenous Once  . chlordiazePOXIDE  25 mg Oral TID  . chlorhexidine gluconate (MEDLINE KIT)  15 mL Mouth Rinse BID  . Chlorhexidine Gluconate Cloth  6 each Topical Daily  . insulin aspart  0-6 Units  Subcutaneous Q6H  . ipratropium-albuterol  3 mL Nebulization TID  . LORazepam  1 mg Intravenous Once  . mouth rinse  15 mL Mouth Rinse 10 times per day  . [START ON 06/04/2019] pantoprazole  40 mg Intravenous Q12H  . sodium chloride flush  3 mL Intravenous Q12H  . venlafaxine XR  300 mg Oral Q breakfast    Continuous Inpatient Infusions:   . dexmedetomidine (PRECEDEX) IV infusion Stopped (06/11/2019 2258)  . dextrose 50 mL/hr at 06/02/19 1237  . norepinephrine (LEVOPHED) Adult infusion Stopped (06/12/2019 0045)  . pantoprazole (PROTONIX) IVPB    . pantoprozole (PROTONIX) infusion 8 mg/hr (06/02/19 0759)  . piperacillin-tazobactam (ZOSYN)  IV 3.375 g (06/02/19 1243)    PRN Inpatient Medications:  acetaminophen **OR** acetaminophen, albuterol, atropine, fentaNYL (SUBLIMAZE) injection, fentaNYL (SUBLIMAZE) injection, hydrALAZINE, ondansetron **OR** ondansetron (ZOFRAN) IV, senna-docusate  Assessment:  1. Severe coagulopathy - Possibly secondary hepatic failure.Possible sepsis.  2. Hypertransaminasemia - Consider toxic exposure (Alcohol + Tylenol), cardiogenic etiology with shock liver, possible sepsis syndrome. AST >>>ALT suggests rhabdomyolysis as primary source of AST elevation.   3. Respiratory failure - On mechanical ventilation.  4. Lactic acidosis.  5. AKI with oliguria  6.  Hemodynamically insignificant gastrointestinal bleed, upper.  Possible Mallory-Weiss tear versus peptic ulcer disease.  Patient has no recent history of esophageal varices on last endoscopy in 2019. Bleeding is resolved clinically at this time.   COVID-19 status: Negative      Recommendations: 1. Continue NAC, though neuro condition is dire with severe encephalopathy on EEG. 2.Continue serial H/H, serial exams. 3. Clearly poor overall prognosis. I spoke with the family and informed them the liver and GI aspects are slowly improving, and deferred questions regarding overall prognosis and plan to  Intensivist  and Neurologist. 4.  Will continue to follow clinical course peripherally. 5.  Continue acid suppression and will consider upper endoscopy depending upon patient clinical course  Thank you for the consult. Please call with questions or concerns.  [place], [place], "Lanny Hurst MD [facility] [place], [place] [address] [phone] [place] Alice Reichert, M.D. 06/02/2019, 2:12 PM

## 2019-06-02 NOTE — Progress Notes (Signed)
[facility] Cardiology  SUBJECTIVE: Patient intubated, no clinical change   Vitals:   06/02/19 0430 06/02/19 0500 06/02/19 0530 06/02/19 0600  BP: (!) 128/58 (!) 127/52 (!) 125/55 (!) 126/51  Pulse: 70 70 70 69  Resp: 16 16 16 16   Temp:      TempSrc:      SpO2: 97% 99% 98% 100%  Weight:      Height:         Intake/Output Summary (Last 24 hours) at 06/02/2019 0741 Last data filed at 06/02/2019 0400 Gross per 24 hour  Intake 4405.13 ml  Output 15 ml  Net 4390.13 ml      PHYSICAL EXAM  General: Intubated HEENT:  Normocephalic and atramatic Neck:  No JVD.  Lungs: Clear bilaterally to auscultation and percussion. Heart: HRRR . Normal S1 and S2 without gallops or murmurs.  Abdomen: Bowel sounds are positive, abdomen soft and non-tender  Msk:  Back normal, normal gait. Normal strength and tone for age. Extremities: No clubbing, cyanosis or edema.   Neuro: Unresponsive Psych: Unresponsive   LABS: Basic Metabolic Panel: Recent Labs    05/29/2019 0429 06/02/19 0445  NA 139 147*  K 4.0 3.6  CL 104 102  CO2 18* 24  GLUCOSE 291* 116*  BUN 60* 71*  CREATININE 1.90* 2.72*  CALCIUM 6.8* 6.6*   Liver Function Tests: Recent Labs    06/16/2019 0429 06/02/19 0445  AST 3,267* 3,731*  ALT 87* 191*  ALKPHOS 90 126  BILITOT 5.2* 5.4*  PROT 4.8* 5.0*  ALBUMIN 2.8* 2.6*   Recent Labs    05/24/2019 1228  LIPASE 18   CBC: Recent Labs    06/22/2019 2049 06/07/2019 0429 06/15/2019 1602 06/02/19 0445  WBC 23.1* 27.4*  --  20.8*  NEUTROABS 18.3*  --   --  PENDING  HGB 11.2* 14.3 13.3 14.7  HCT 37.3 45.0 40.5 44.8  MCV 98.9 91.1  --  87.3  PLT 244 158  --  167   Cardiac Enzymes: Recent Labs    06/07/2019 2049 06/17/2019 0429  CKTOTAL 3,445* 3,496*   BNP: Invalid input(s): POCBNP D-Dimer: No results for input(s): DDIMER in the last 72 hours. Hemoglobin A1C: Recent Labs    06/11/2019 0429  HGBA1C 5.2   Fasting Lipid Panel: No results for input(s): CHOL, HDL, LDLCALC, TRIG,  CHOLHDL, LDLDIRECT in the last 72 hours. Thyroid Function Tests: Recent Labs    06/05/2019 2049 06/13/2019 0429  TSH 6.540* 2.738  T4TOTAL 5.2  --    Anemia Panel: No results for input(s): VITAMINB12, FOLATE, FERRITIN, TIBC, IRON, RETICCTPCT in the last 72 hours.  EEG  Result Date: 06/22/2019 Lora Havens, MD     05/29/2019  5:22 PM Patient Name: [name] MRN: [id number] Epilepsy Attending: Lora Havens Referring Physician/Provider: Darel Hong, NP Date: 06/01/2019 Duration: 24.57mins Patient history: 63yo F with ams. EEG to evaluate for seizure Level of alertness: comatose AEDs during EEG study: Librium, lorazepam Technical aspects: This EEG study was done with scalp electrodes positioned according to the 10-20 International system of electrode placement. Electrical activity was acquired at a sampling rate of 500Hz  and reviewed with a high frequency filter of 70Hz  and a low frequency filter of 1Hz . EEG data were recorded continuously and digitally stored. DESCRIPTION: EEG showed continuous generalized 3-5hz  theta-delta slowing admixed with an excessive amount of 15 to 18 Hz, 2-3 uV beta activity with irregular morphology distributed symmetrically and diffusely. Hyperventilation and photic stimulation were not performed. Episode of  left shoulder and feet and head twitching was recorded per eeg tech annotation ( difficult to visualize on video). Concomitant eeg before, during and after the event didn't show any eeg change to suggest seizure. ABNORMALITY - Continuous slow, generalized - Excessive beta, generalized IMPRESSION: This study is suggestive of severe diffuse encephalopathy, non specific to etiology.  The excessive beta activity seen in the background is most likely due to the effect of benzodiazepine and is a benign EEG pattern. One episode of left shoulder, feet and head twitching was recorded without EEG change and was likely non epileptic. No definite seizures or epileptiform  discharges were seen throughout the recording. Clinical correlation is recommended Priyanka Barbra Sarks   CT ABDOMEN PELVIS WO CONTRAST  Result Date: 06/04/2019 CLINICAL DATA:  Abdominal rigidity EXAM: CT ABDOMEN AND PELVIS WITHOUT CONTRAST TECHNIQUE: Multidetector CT imaging of the abdomen and pelvis was performed following the standard protocol without IV contrast. COMPARISON:  Ultrasound from 04/01/2019 FINDINGS: Lower chest: Small right pleural effusion is noted with bibasilar atelectasis right greater than left. No focal parenchymal nodules are seen. Gastric catheter is noted extending into the stomach. Hepatobiliary: Liver is well visualized and within normal limits. Gallbladder demonstrates some mild wall thickening likely reactive related to the ascites. Increased density is noted within consistent with cholelithiasis and gallbladder sludge similar to that noted on prior ultrasound. Pancreas: Unremarkable. No pancreatic ductal dilatation or surrounding inflammatory changes. Spleen: Normal in size without focal abnormality. Adrenals/Urinary Tract: Adrenal glands are within normal limits. Kidneys are well visualized bilaterally. No renal calculi or obstructive changes are seen. The bladder is decompressed by Foley catheter and obscured by bilateral hip replacements. Stomach/Bowel: Scattered diverticular change of the colon is noted without evidence of diverticulitis. No obstructive or inflammatory changes are seen. The appendix is within normal limits. Some inflammatory changes are noted surrounding the second portion of the duodenum and head of the pancreas as well as extending into the mesentery inferiorly. This may be related to some focal duodenitis although may be reactive related to the mild ascites. Vascular/Lymphatic: Aortic atherosclerosis. No enlarged abdominal or pelvic lymph nodes. Reproductive: Status post hysterectomy. No adnexal masses. Other: No abdominal wall hernia or abnormality. No  abdominopelvic ascites. Musculoskeletal: Bilateral hip replacements are noted. Degenerative changes of lumbar spine are seen. IMPRESSION: Small right pleural effusion with bibasilar atelectasis. Cholelithiasis and gallbladder sludge. Mild ascites. Inflammatory changes surrounding duodenum and head of the pancreas likely related to some duodenitis. No perforation or definitive ulcer crater is seen. These changes may be in part related to the underlying ascites as well. No other focal abnormality is noted. Electronically Signed   By: Inez Catalina M.D.   On: 05/26/2019 22:28   CT HEAD WO CONTRAST  Result Date: 06/14/2019 CLINICAL DATA:  Encephalopathy EXAM: CT HEAD WITHOUT CONTRAST TECHNIQUE: Contiguous axial images were obtained from the base of the skull through the vertex without intravenous contrast. COMPARISON:  06/12/2019 FINDINGS: Brain: There is no mass, hemorrhage or extra-axial collection. The size and configuration of the ventricles and extra-axial CSF spaces are normal. The brain parenchyma is normal, without acute or chronic infarction. Vascular: No abnormal hyperdensity of the major intracranial arteries or dural venous sinuses. No intracranial atherosclerosis. Skull: The visualized skull base, calvarium and extracranial soft tissues are normal. Sinuses/Orbits: No fluid levels or advanced mucosal thickening of the visualized paranasal sinuses. No mastoid or middle ear effusion. The orbits are normal. IMPRESSION: Normal head CT. Electronically Signed   By: Cletus Gash.D.  On: 06/18/2019 02:53   CT Head Wo Contrast  Result Date: 06/05/2019 CLINICAL DATA:  Altered level of consciousness. Confusion. Parkinson's. EXAM: CT HEAD WITHOUT CONTRAST TECHNIQUE: Contiguous axial images were obtained from the base of the skull through the vertex without intravenous contrast. COMPARISON:  April 13, 2019 FINDINGS: Brain: No subdural, epidural, or subarachnoid hemorrhage identified. Cerebellum, brainstem,  and basal cisterns are normal. No mass effect or midline shift. No acute cortical ischemia or infarct identified. Vascular: No hyperdense vessel or unexpected calcification. Skull: Normal. Negative for fracture or focal lesion. Sinuses/Orbits: No acute finding. Other: None. IMPRESSION: 1. No acute intracranial abnormalities are identified. 2. Electronically Signed   By: Dorise Bullion III M.D   On: 06/20/2019 14:29   CARDIAC CATHETERIZATION  Result Date: 06/23/2019 Successful transvenous temporary pacemaker  US RENAL  Result Date: 05/25/2019 CLINICAL DATA:  AKI EXAM: RENAL / URINARY TRACT ULTRASOUND COMPLETE COMPARISON:  None. FINDINGS: Right Kidney: Renal measurements: 10.4 x 4.5 x 5.6 cm = volume: 136 mL . Echogenicity is increased. No mass or hydronephrosis visualized. Left Kidney: Renal measurements: 10.2 x 5.3 x 4.3 cm = volume: 123 mL. Echogenicity is increased. No mass or hydronephrosis visualized. Bladder: Decompressed by Foley catheter. Other: Cholelithiasis, sludge, mild gallbladder wall thickening. Mild perinephric fluid. Free fluid is present in the pelvis. IMPRESSION: No hydronephrosis. Increased renal echogenicity reflecting medical renal disease. Mild free fluid. Gallbladder stones, sludge, and wall thickening. Electronically Signed   By: Macy Mis M.D.   On: 06/09/2019 11:35   DG Chest Port 1 View  Result Date: 06/02/2019 CLINICAL DATA:  Acute respiratory failure. EXAM: PORTABLE CHEST 1 VIEW COMPARISON:  Radiograph 05/30/2019 FINDINGS: Endotracheal tube tip 17 mm from the carina. Enteric tube tip and side-port below the diaphragm in the stomach. Right internal jugular central venous catheter tip in the upper SVC. Pacemaker from an inferior approach projects over the expected right ventricle. Increasing hazy opacity at the lung bases consistent with pleural effusions and atelectasis. Vascular congestion. No pneumothorax. IMPRESSION: 1. Worsening bilateral pleural effusions, right  greater than left. 2. Vascular congestion. 3. Temporary pacemaker placement. Support apparatus otherwise unchanged. Electronically Signed   By: Keith Rake M.D.   On: 06/02/2019 05:08   DG Chest Port 1 View  Result Date: 06/19/2019 CLINICAL DATA:  Central line placement. EXAM: PORTABLE CHEST 1 VIEW COMPARISON:  Radiograph earlier this day. FINDINGS: Tip of the right internal jugular central venous catheter projects over the upper SVC. No visualized pneumothorax. Endotracheal tube and enteric tube remain in place. Borderline cardiomegaly. Unchanged vascular congestion. No new airspace disease. Streaky bibasilar atelectasis. IMPRESSION: 1. Tip of the right internal jugular central venous catheter projects over the upper SVC. No pneumothorax. 2. Unchanged vascular congestion. 3. Streaky bibasilar atelectasis. Electronically Signed   By: Keith Rake M.D.   On: 06/08/2019 00:19   DG Chest Portable 1 View  Result Date: 06/21/2019 CLINICAL DATA:  Respiratory distress. Endotracheal tube and OG tube placement. EXAM: PORTABLE CHEST 1 VIEW COMPARISON:  06/12/2019 FINDINGS: Endotracheal tube is noted with tip 2.8 cm above the carina. An OG tube is noted entering the stomach with tip just off of the field of view. This is a mildly low volume film. Cardiomegaly and mild pulmonary vascular congestion noted. No pneumothorax or pleural effusion. Defibrillator/pacing pads overlying the chest noted. No acute bony abnormalities are identified. IMPRESSION: 1. Endotracheal tube with tip 2.8 cm above the carina. 2. OG tube entering the stomach with tip just off of the field  of view. 3. Cardiomegaly with mild pulmonary vascular congestion. Electronically Signed   By: Margarette Canada M.D.   On: 06/22/2019 20:51   DG Chest Portable 1 View  Result Date: 06/08/2019 CLINICAL DATA:  Altered mental status. EXAM: PORTABLE CHEST 1 VIEW COMPARISON:  April 17, 2019. FINDINGS: The heart size and mediastinal contours are within  normal limits. Both lungs are clear. The visualized skeletal structures are unremarkable. IMPRESSION: No active disease. Electronically Signed   By: Marijo Conception M.D.   On: 06/19/2019 14:02   DG Abd Portable 1 View  Result Date: 06/17/2019 CLINICAL DATA:  OG tube placement. EXAM: PORTABLE ABDOMEN - 1 VIEW COMPARISON:  None. FINDINGS: An OG tube is noted with tip overlying the mid stomach. Bowel gas pattern is unremarkable. IMPRESSION: 1. OG tube with tip overlying the mid stomach. 2. Unremarkable bowel gas pattern. Electronically Signed   By: Margarette Canada M.D.   On: 06/23/2019 20:52   ECHOCARDIOGRAM COMPLETE  Result Date: 06/05/2019   ECHOCARDIOGRAM REPORT   Patient Name:   Simranjit D Blanda Date of Exam: 05/25/2019 Medical Rec #:  [id number]     Height:       65.0 in Accession #:    [id number]    Weight:       135.4 lb Date of Birth:  [birth year]     BSA:          1.68 m Patient Age:    63 years      BP:           133/70 mmHg Patient Gender: F             HR:           70 bpm. Exam Location:  [facility] Procedure: 2D Echo, Color Doppler and Cardiac Doppler Indications:     R55 Syncope  History:         Patient has no prior history of Echocardiogram examinations.                  ETOH abuse.  Sonographer:     Charmayne Sheer RDCS (AE) Referring Phys:  [id number] TAWFIKUL ALAM Diagnosing Phys: Isaias Cowman MD  Sonographer Comments: Echo performed with patient supine and on artificial respirator. IMPRESSIONS  1. Left ventricular ejection fraction, by visual estimation, is 40 to 45%. The left ventricle has normal function. There is no left ventricular hypertrophy.  2. Mildly dilated left ventricular internal cavity size.  3. The left ventricle has no regional wall motion abnormalities.  4. Global right ventricle has normal systolic function.The right ventricular size is normal. No increase in right ventricular wall thickness.  5. Left atrial size was mild-moderately dilated.  6. Right atrial size was normal.  7. The mitral  valve is normal in structure. Moderate mitral valve regurgitation. No evidence of mitral stenosis.  8. The tricuspid valve is normal in structure. Tricuspid valve regurgitation is not demonstrated.  9. The aortic valve is normal in structure. Aortic valve regurgitation is not visualized. No evidence of aortic valve sclerosis or stenosis. 10. The pulmonic valve was normal in structure. Pulmonic valve regurgitation is not visualized. 11. Moderately elevated pulmonary artery systolic pressure. 12. The inferior vena cava is normal in size with greater than 50% respiratory variability, suggesting right atrial pressure of 3 mmHg. FINDINGS  Left Ventricle: Left ventricular ejection fraction, by visual estimation, is 40 to 45%. The left ventricle has normal function. The left ventricle has no regional wall motion abnormalities. The  left ventricular internal cavity size was mildly dilated left ventricle. There is no left ventricular hypertrophy. Normal left atrial pressure. Right Ventricle: The right ventricular size is normal. No increase in right ventricular wall thickness. Global RV systolic function is has normal systolic function. The tricuspid regurgitant velocity is 2.96 m/s, and with an assumed right atrial pressure  of 10 mmHg, the estimated right ventricular systolic pressure is moderately elevated at 45.2 mmHg. Left Atrium: Left atrial size was mild-moderately dilated. Right Atrium: Right atrial size was normal in size Pericardium: There is no evidence of pericardial effusion. Mitral Valve: The mitral valve is normal in structure. Moderate mitral valve regurgitation. No evidence of mitral valve stenosis by observation. MV peak gradient, 6.2 mmHg. Tricuspid Valve: The tricuspid valve is normal in structure. Tricuspid valve regurgitation is not demonstrated. Aortic Valve: The aortic valve is normal in structure. Aortic valve regurgitation is not visualized. The aortic valve is structurally normal, with no evidence  of sclerosis or stenosis. Aortic valve mean gradient measures 2.0 mmHg. Aortic valve peak gradient measures 3.7 mmHg. Aortic valve area, by VTI measures 1.32 cm. Pulmonic Valve: The pulmonic valve was normal in structure. Pulmonic valve regurgitation is not visualized. Pulmonic regurgitation is not visualized. Aorta: The aortic root, ascending aorta and aortic arch are all structurally normal, with no evidence of dilitation or obstruction. Venous: The inferior vena cava is normal in size with greater than 50% respiratory variability, suggesting right atrial pressure of 3 mmHg. IAS/Shunts: No atrial level shunt detected by color flow Doppler. There is no evidence of a patent foramen ovale. No ventricular septal defect is seen or detected. There is no evidence of an atrial septal defect.  LEFT VENTRICLE PLAX 2D LVIDd:         4.94 cm LVIDs:         4.14 cm LV PW:         0.84 cm LV IVS:        0.65 cm LVOT diam:     1.80 cm LV SV:         39 ml LV SV Index:   23.20 LVOT Area:     2.54 cm  LV Volumes (MOD) LV area d, A2C:    20.50 cm LV area d, A4C:    20.50 cm LV area s, A2C:    14.00 cm LV area s, A4C:    15.60 cm LV major d, A2C:   6.47 cm LV major d, A4C:   5.89 cm LV major s, A2C:   5.74 cm LV major s, A4C:   5.62 cm LV vol d, MOD A2C: 53.7 ml LV vol d, MOD A4C: 57.7 ml LV vol s, MOD A2C: 28.2 ml LV vol s, MOD A4C: 36.1 ml LV SV MOD A2C:     25.5 ml LV SV MOD A4C:     57.7 ml LV SV MOD BP:      26.1 ml RIGHT VENTRICLE RV Basal diam:  3.13 cm LEFT ATRIUM             Index       RIGHT ATRIUM           Index LA diam:        4.20 cm 2.51 cm/m  RA Area:     17.40 cm LA Vol (A2C):   80.6 ml 48.10 ml/m RA Volume:   48.40 ml  28.88 ml/m LA Vol (A4C):   78.9 ml 47.08 ml/m LA Biplane Vol: 82.7 ml  49.35 ml/m  AORTIC VALVE                   PULMONIC VALVE AV Area (Vmax):    1.51 cm    PV Vmax:       0.75 m/s AV Area (Vmean):   1.39 cm    PV Vmean:      48.300 cm/s AV Area (VTI):     1.32 cm    PV VTI:         0.117 m AV Vmax:           95.70 cm/s  PV Peak grad:  2.3 mmHg AV Vmean:          62.900 cm/s PV Mean grad:  1.0 mmHg AV VTI:            0.162 m AV Peak Grad:      3.7 mmHg AV Mean Grad:      2.0 mmHg LVOT Vmax:         56.90 cm/s LVOT Vmean:        34.400 cm/s LVOT VTI:          0.084 m LVOT/AV VTI ratio: 0.52  AORTA Ao Root diam: 2.80 cm MITRAL VALVE                         TRICUSPID VALVE MV Area (PHT): 4.89 cm              TR Peak grad:   35.2 mmHg MV Peak grad:  6.2 mmHg              TR Vmax:        304.00 cm/s MV Mean grad:  2.0 mmHg MV Vmax:       1.24 m/s              SHUNTS MV Vmean:      65.6 cm/s             Systemic VTI:  0.08 m MV VTI:        0.29 m                Systemic Diam: 1.80 cm MV PHT:        44.95 msec MV Decel Time: 155 msec MV E velocity: 107.00 cm/s 103 cm/s MV A velocity: 39.80 cm/s  70.3 cm/s MV E/A ratio:  2.69        1.5  Isaias Cowman MD Electronically signed by Isaias Cowman MD Signature Date/Time: 06/17/2019/1:40:24 PM    Final      Echo LV ejection fraction 40 to 45%  TELEMETRY: Ventricular pacing at 70 bpm:  ASSESSMENT AND PLAN:  Active Problems:   Altered level of consciousness    1.  Bradycardia, atrial fibrillation with slow ventricular rate versus junctional rhythm, of uncertain clinical significance, status post temporary transvenous pacemaker via right femoral vein, pacing appropriately at 70 bpm, underlying rhythm appears to be junctional rhythm at 55 bpm 2.  Paroxysmal atrial fibrillation, chads vas score of 2, with underlying history of Parkinson's, EtOH abuse, poor medical and dietary compliance, poor candidate for chronic anticoagulation 3.  Acute renal failure, likely secondary to dehydration 4.  Altered mental status, metabolic encephalopathy, likely multifactorial, with underlying history of Parkinson's, EtOH abuse, poor medical and dietary noncompliance, poor general health 5.  Borderline elevated troponin, (201, 214 ), likely demand  supply ischemia, not due to acute coronary syndrome 6.  Rhabdomyolysis, total  CK 3004-96 7.  Liver failure, AST 3267 8.  Multiorgan failure, very poor prognosis  Recommendations  1.  Agree with overall current therapy 2.  Decrease ventricular rate on temporary pacemaker to 50 bpm so the patient will breakthrough with underlying rhythm which appears to be junctional rhythm at 55 to 60 bpm 3.  Defer chronic anticoagulation 4.  Defer permanent pacemaker implantation   Isaias Cowman, MD, PhD, [facility] 06/02/2019 7:41 AM

## 2019-06-03 DIAGNOSIS — Z66 Do not resuscitate: Secondary | ICD-10-CM

## 2019-06-03 DIAGNOSIS — N179 Acute kidney failure, unspecified: Secondary | ICD-10-CM

## 2019-06-03 DIAGNOSIS — J9600 Acute respiratory failure, unspecified whether with hypoxia or hypercapnia: Secondary | ICD-10-CM

## 2019-06-03 DIAGNOSIS — Z515 Encounter for palliative care: Secondary | ICD-10-CM

## 2019-06-03 DIAGNOSIS — Z7189 Other specified counseling: Secondary | ICD-10-CM

## 2019-06-03 LAB — CBC
HCT: 40.9 % (ref 36.0–46.0)
Hemoglobin: 13.4 g/dL (ref 12.0–15.0)
MCH: 28.7 pg (ref 26.0–34.0)
MCHC: 32.8 g/dL (ref 30.0–36.0)
MCV: 87.6 fL (ref 80.0–100.0)
Platelets: 147 10*3/uL — ABNORMAL LOW (ref 150–400)
RBC: 4.67 MIL/uL (ref 3.87–5.11)
RDW: 17.1 % — ABNORMAL HIGH (ref 11.5–15.5)
WBC: 20.7 10*3/uL — ABNORMAL HIGH (ref 4.0–10.5)
nRBC: 2.5 % — ABNORMAL HIGH (ref 0.0–0.2)

## 2019-06-03 LAB — HEMOGLOBIN AND HEMATOCRIT, BLOOD
HCT: 40.8 % (ref 36.0–46.0)
HCT: 37.7 % (ref 36.0–46.0)
Hemoglobin: 13.9 g/dL (ref 12.0–15.0)
Hemoglobin: 12.4 g/dL (ref 12.0–15.0)

## 2019-06-03 LAB — BASIC METABOLIC PANEL
Anion gap: 22 — ABNORMAL HIGH (ref 5–15)
BUN: 86 mg/dL — ABNORMAL HIGH (ref 8–23)
CO2: 22 mmol/L (ref 22–32)
Calcium: 6.7 mg/dL — ABNORMAL LOW (ref 8.9–10.3)
Chloride: 101 mmol/L (ref 98–111)
Creatinine, Ser: 4.1 mg/dL — ABNORMAL HIGH (ref 0.44–1.00)
GFR calc Af Amer: 12 mL/min — ABNORMAL LOW (ref >60)
GFR calc non Af Amer: 11 mL/min — ABNORMAL LOW (ref >60)
Glucose, Bld: 136 mg/dL — ABNORMAL HIGH (ref 70–99)
Potassium: 3.5 mmol/L (ref 3.5–5.1)
Sodium: 145 mmol/L (ref 135–145)

## 2019-06-03 LAB — LACTIC ACID, PLASMA: Lactic Acid, Venous: 3 mmol/L (ref 0.5–1.9)

## 2019-06-03 LAB — GLUCOSE, CAPILLARY
Glucose-Capillary: 118 mg/dL — ABNORMAL HIGH (ref 70–99)
Glucose-Capillary: 126 mg/dL — ABNORMAL HIGH (ref 70–99)
Glucose-Capillary: 133 mg/dL — ABNORMAL HIGH (ref 70–99)
Glucose-Capillary: 154 mg/dL — ABNORMAL HIGH (ref 70–99)

## 2019-06-03 LAB — PROTIME-INR
INR: 2.6 — ABNORMAL HIGH (ref 0.8–1.2)
Prothrombin Time: 28 seconds — ABNORMAL HIGH (ref 11.4–15.2)

## 2019-06-03 LAB — PHOSPHORUS: Phosphorus: 4 mg/dL (ref 2.5–4.6)

## 2019-06-03 LAB — MAGNESIUM: Magnesium: 1.9 mg/dL (ref 1.7–2.4)

## 2019-06-03 MED ORDER — POTASSIUM CHLORIDE 10 MEQ/100ML IV SOLN
10.0000 meq | Freq: Once | INTRAVENOUS | Status: AC
  Administered 2019-06-03: 10 meq via INTRAVENOUS
  Filled 2019-06-03: qty 100

## 2019-06-03 MED ORDER — MIDAZOLAM HCL 2 MG/2ML IJ SOLN
INTRAMUSCULAR | Status: AC
  Administered 2019-06-03: 2 mg via INTRAVENOUS
  Filled 2019-06-03: qty 4

## 2019-06-03 MED ORDER — MIDAZOLAM HCL 2 MG/2ML IJ SOLN: 2.0000 mg | Freq: Once | INTRAMUSCULAR | Status: AC

## 2019-06-03 MED ORDER — FENTANYL CITRATE (PF) 100 MCG/2ML IJ SOLN
INTRAMUSCULAR | Status: AC
  Administered 2019-06-03: 100 ug via INTRAVENOUS
  Filled 2019-06-03: qty 4

## 2019-06-03 MED ORDER — SODIUM CHLORIDE 0.9% IV SOLUTION
Freq: Once | INTRAVENOUS | Status: AC
  Administered 2019-06-03: 17:00:00 via INTRAVENOUS

## 2019-06-03 MED ORDER — CIPROFLOXACIN IN D5W 400 MG/200ML IV SOLN
400.0000 mg | INTRAVENOUS | Status: DC
  Administered 2019-06-03 – 2019-06-05 (×3): 400 mg via INTRAVENOUS
  Filled 2019-06-03 (×4): qty 200

## 2019-06-03 MED ORDER — CARBIDOPA-LEVODOPA 25-100 MG PO TABS
1.0000 | ORAL_TABLET | Freq: Three times a day (TID) | ORAL | Status: DC
  Administered 2019-06-03 – 2019-06-04 (×4): 1 via ORAL
  Filled 2019-06-03 (×23): qty 1

## 2019-06-03 MED ORDER — METRONIDAZOLE IN NACL 5-0.79 MG/ML-% IV SOLN
500.0000 mg | Freq: Three times a day (TID) | INTRAVENOUS | Status: DC
  Administered 2019-06-03 – 2019-06-06 (×9): 500 mg via INTRAVENOUS
  Filled 2019-06-03 (×11): qty 100

## 2019-06-03 MED ORDER — PIPERACILLIN-TAZOBACTAM 3.375 G IVPB: 3.3750 g | Freq: Two times a day (BID) | INTRAVENOUS | Status: DC

## 2019-06-03 MED ORDER — SODIUM CHLORIDE 0.9 % IV SOLN
0.3000 ug/kg | Freq: Once | INTRAVENOUS | Status: AC
  Administered 2019-06-03: 20 ug via INTRAVENOUS
  Filled 2019-06-03 (×2): qty 5

## 2019-06-03 MED ORDER — FENTANYL CITRATE (PF) 100 MCG/2ML IJ SOLN: 100.0000 ug | Freq: Once | INTRAMUSCULAR | Status: AC

## 2019-06-03 MED ORDER — LACTULOSE 10 GM/15ML PO SOLN
20.0000 g | Freq: Four times a day (QID) | ORAL | Status: DC
  Administered 2019-06-04 – 2019-06-05 (×5): 20 g via ORAL
  Filled 2019-06-03 (×5): qty 30

## 2019-06-03 NOTE — Progress Notes (Signed)
[facility] Cardiology  SUBJECTIVE: Intubated, no real clinical change   Vitals:   06/03/19 0400 06/03/19 0444 06/03/19 0500 06/03/19 0600  BP: (!) 118/95  (!) 117/106 (!) 109/56  Pulse: 66  72 67  Resp: (!) 27  (!) 8 (!) 7  Temp:   (!) 100.6 F (38.1 C)   TempSrc:   Bladder   SpO2: (!) 87%  95% 97%  Weight:  66.6 kg    Height:         Intake/Output Summary (Last 24 hours) at 06/03/2019 0830 Last data filed at 06/03/2019 0600 Gross per 24 hour  Intake 1096.08 ml  Output 175 ml  Net 921.08 ml      PHYSICAL EXAM  General: Well developed, well nourished, in no acute distress HEENT:  Normocephalic and atramatic Neck:  No JVD.  Lungs: Clear bilaterally to auscultation and percussion. Heart: HRRR . Normal S1 and S2 without gallops or murmurs.  Abdomen: Bowel sounds are positive, abdomen soft and non-tender  Msk:  Back normal, normal gait. Normal strength and tone for age. Extremities: No clubbing, cyanosis or edema.   Neuro: Alert and oriented X 3. Psych:  Good affect, responds appropriately   LABS: Basic Metabolic Panel: Recent Labs    06/02/19 0445 06/03/19 0538  NA 147* 145  K 3.6 3.5  CL 102 101  CO2 24 22  GLUCOSE 116* 136*  BUN 71* 86*  CREATININE 2.72* 4.10*  CALCIUM 6.6* 6.7*  MG  --  1.9   Liver Function Tests: Recent Labs    05/30/2019 0429 06/02/19 0445  AST 3,267* 3,731*  ALT 87* 191*  ALKPHOS 90 126  BILITOT 5.2* 5.4*  PROT 4.8* 5.0*  ALBUMIN 2.8* 2.6*   Recent Labs    06/21/2019 1228  LIPASE 18   CBC: Recent Labs    05/31/2019 2049 06/12/2019 0001 06/02/19 0445 06/02/19 1634 06/03/19 0538  WBC 23.1*   < > 20.8*  --  20.7*  NEUTROABS 18.3*  --  16.5*  --   --   HGB 11.2*  --  14.7 14.5 13.4  HCT 37.3  --  44.8 44.5 40.9  MCV 98.9   < > 87.3  --  87.6  PLT 244   < > 167  --  147*   < > = values in this interval not displayed.   Cardiac Enzymes: Recent Labs    06/11/2019 2049 05/31/2019 0429  CKTOTAL 3,445* 3,496*   BNP: Invalid  input(s): POCBNP D-Dimer: No results for input(s): DDIMER in the last 72 hours. Hemoglobin A1C: Recent Labs    06/16/2019 0429  HGBA1C 5.2   Fasting Lipid Panel: No results for input(s): CHOL, HDL, LDLCALC, TRIG, CHOLHDL, LDLDIRECT in the last 72 hours. Thyroid Function Tests: Recent Labs    06/20/2019 2049 06/15/2019 0429  TSH 6.540* 2.738  T4TOTAL 5.2  --    Anemia Panel: No results for input(s): VITAMINB12, FOLATE, FERRITIN, TIBC, IRON, RETICCTPCT in the last 72 hours.  EEG  Result Date: 06/04/2019 Lora Havens, MD     06/14/2019  5:22 PM Patient Name: [name] MRN: [id number] Epilepsy Attending: Lora Havens Referring Physician/Provider: Darel Hong, NP Date: 06/01/2019 Duration: 24.48mins Patient history: 63yo F with ams. EEG to evaluate for seizure Level of alertness: comatose AEDs during EEG study: Librium, lorazepam Technical aspects: This EEG study was done with scalp electrodes positioned according to the 10-20 International system of electrode placement. Electrical activity was acquired at a sampling rate of  500Hz  and reviewed with a high frequency filter of 70Hz  and a low frequency filter of 1Hz . EEG data were recorded continuously and digitally stored. DESCRIPTION: EEG showed continuous generalized 3-5hz  theta-delta slowing admixed with an excessive amount of 15 to 18 Hz, 2-3 uV beta activity with irregular morphology distributed symmetrically and diffusely. Hyperventilation and photic stimulation were not performed. Episode of left shoulder and feet and head twitching was recorded per eeg tech annotation ( difficult to visualize on video). Concomitant eeg before, during and after the event didn't show any eeg change to suggest seizure. ABNORMALITY - Continuous slow, generalized - Excessive beta, generalized IMPRESSION: This study is suggestive of severe diffuse encephalopathy, non specific to etiology.  The excessive beta activity seen in the background is most likely  due to the effect of benzodiazepine and is a benign EEG pattern. One episode of left shoulder, feet and head twitching was recorded without EEG change and was likely non epileptic. No definite seizures or epileptiform discharges were seen throughout the recording. Clinical correlation is recommended Priyanka Barbra Sarks   US RENAL  Result Date: 06/10/2019 CLINICAL DATA:  AKI EXAM: RENAL / URINARY TRACT ULTRASOUND COMPLETE COMPARISON:  None. FINDINGS: Right Kidney: Renal measurements: 10.4 x 4.5 x 5.6 cm = volume: 136 mL . Echogenicity is increased. No mass or hydronephrosis visualized. Left Kidney: Renal measurements: 10.2 x 5.3 x 4.3 cm = volume: 123 mL. Echogenicity is increased. No mass or hydronephrosis visualized. Bladder: Decompressed by Foley catheter. Other: Cholelithiasis, sludge, mild gallbladder wall thickening. Mild perinephric fluid. Free fluid is present in the pelvis. IMPRESSION: No hydronephrosis. Increased renal echogenicity reflecting medical renal disease. Mild free fluid. Gallbladder stones, sludge, and wall thickening. Electronically Signed   By: Macy Mis M.D.   On: 06/07/2019 11:35   DG Chest Port 1 View  Result Date: 06/02/2019 CLINICAL DATA:  Acute respiratory failure. EXAM: PORTABLE CHEST 1 VIEW COMPARISON:  Radiograph 05/28/2019 FINDINGS: Endotracheal tube tip 17 mm from the carina. Enteric tube tip and side-port below the diaphragm in the stomach. Right internal jugular central venous catheter tip in the upper SVC. Pacemaker from an inferior approach projects over the expected right ventricle. Increasing hazy opacity at the lung bases consistent with pleural effusions and atelectasis. Vascular congestion. No pneumothorax. IMPRESSION: 1. Worsening bilateral pleural effusions, right greater than left. 2. Vascular congestion. 3. Temporary pacemaker placement. Support apparatus otherwise unchanged. Electronically Signed   By: Keith Rake M.D.   On: 06/02/2019 05:08    ECHOCARDIOGRAM COMPLETE  Result Date: 05/31/2019   ECHOCARDIOGRAM REPORT   Patient Name:   [name] Date of Exam: 05/24/2019 Medical Rec #:  [id number]     Height:       65.0 in Accession #:    [id number]    Weight:       135.4 lb Date of Birth:  1956-04-27     BSA:          1.68 m Patient Age:    18 years      BP:           133/70 mmHg Patient Gender: F             HR:           70 bpm. Exam Location:  [facility] Procedure: 2D Echo, Color Doppler and Cardiac Doppler Indications:     R55 Syncope  History:         Patient has no prior history of Echocardiogram examinations.  ETOH abuse.  Sonographer:     Charmayne Sheer RDCS (AE) Referring Phys:  [id number] TAWFIKUL ALAM Diagnosing Phys: Isaias Cowman MD  Sonographer Comments: Echo performed with patient supine and on artificial respirator. IMPRESSIONS  1. Left ventricular ejection fraction, by visual estimation, is 40 to 45%. The left ventricle has normal function. There is no left ventricular hypertrophy.  2. Mildly dilated left ventricular internal cavity size.  3. The left ventricle has no regional wall motion abnormalities.  4. Global right ventricle has normal systolic function.The right ventricular size is normal. No increase in right ventricular wall thickness.  5. Left atrial size was mild-moderately dilated.  6. Right atrial size was normal.  7. The mitral valve is normal in structure. Moderate mitral valve regurgitation. No evidence of mitral stenosis.  8. The tricuspid valve is normal in structure. Tricuspid valve regurgitation is not demonstrated.  9. The aortic valve is normal in structure. Aortic valve regurgitation is not visualized. No evidence of aortic valve sclerosis or stenosis. 10. The pulmonic valve was normal in structure. Pulmonic valve regurgitation is not visualized. 11. Moderately elevated pulmonary artery systolic pressure. 12. The inferior vena cava is normal in size with greater than 50% respiratory variability,  suggesting right atrial pressure of 3 mmHg. FINDINGS  Left Ventricle: Left ventricular ejection fraction, by visual estimation, is 40 to 45%. The left ventricle has normal function. The left ventricle has no regional wall motion abnormalities. The left ventricular internal cavity size was mildly dilated left ventricle. There is no left ventricular hypertrophy. Normal left atrial pressure. Right Ventricle: The right ventricular size is normal. No increase in right ventricular wall thickness. Global RV systolic function is has normal systolic function. The tricuspid regurgitant velocity is 2.96 m/s, and with an assumed right atrial pressure  of 10 mmHg, the estimated right ventricular systolic pressure is moderately elevated at 45.2 mmHg. Left Atrium: Left atrial size was mild-moderately dilated. Right Atrium: Right atrial size was normal in size Pericardium: There is no evidence of pericardial effusion. Mitral Valve: The mitral valve is normal in structure. Moderate mitral valve regurgitation. No evidence of mitral valve stenosis by observation. MV peak gradient, 6.2 mmHg. Tricuspid Valve: The tricuspid valve is normal in structure. Tricuspid valve regurgitation is not demonstrated. Aortic Valve: The aortic valve is normal in structure. Aortic valve regurgitation is not visualized. The aortic valve is structurally normal, with no evidence of sclerosis or stenosis. Aortic valve mean gradient measures 2.0 mmHg. Aortic valve peak gradient measures 3.7 mmHg. Aortic valve area, by VTI measures 1.32 cm. Pulmonic Valve: The pulmonic valve was normal in structure. Pulmonic valve regurgitation is not visualized. Pulmonic regurgitation is not visualized. Aorta: The aortic root, ascending aorta and aortic arch are all structurally normal, with no evidence of dilitation or obstruction. Venous: The inferior vena cava is normal in size with greater than 50% respiratory variability, suggesting right atrial pressure of 3 mmHg.  IAS/Shunts: No atrial level shunt detected by color flow Doppler. There is no evidence of a patent foramen ovale. No ventricular septal defect is seen or detected. There is no evidence of an atrial septal defect.  LEFT VENTRICLE PLAX 2D LVIDd:         4.94 cm LVIDs:         4.14 cm LV PW:         0.84 cm LV IVS:        0.65 cm LVOT diam:     1.80 cm LV SV:  39 ml LV SV Index:   23.20 LVOT Area:     2.54 cm  LV Volumes (MOD) LV area d, A2C:    20.50 cm LV area d, A4C:    20.50 cm LV area s, A2C:    14.00 cm LV area s, A4C:    15.60 cm LV major d, A2C:   6.47 cm LV major d, A4C:   5.89 cm LV major s, A2C:   5.74 cm LV major s, A4C:   5.62 cm LV vol d, MOD A2C: 53.7 ml LV vol d, MOD A4C: 57.7 ml LV vol s, MOD A2C: 28.2 ml LV vol s, MOD A4C: 36.1 ml LV SV MOD A2C:     25.5 ml LV SV MOD A4C:     57.7 ml LV SV MOD BP:      26.1 ml RIGHT VENTRICLE RV Basal diam:  3.13 cm LEFT ATRIUM             Index       RIGHT ATRIUM           Index LA diam:        4.20 cm 2.51 cm/m  RA Area:     17.40 cm LA Vol (A2C):   80.6 ml 48.10 ml/m RA Volume:   48.40 ml  28.88 ml/m LA Vol (A4C):   78.9 ml 47.08 ml/m LA Biplane Vol: 82.7 ml 49.35 ml/m  AORTIC VALVE                   PULMONIC VALVE AV Area (Vmax):    1.51 cm    PV Vmax:       0.75 m/s AV Area (Vmean):   1.39 cm    PV Vmean:      48.300 cm/s AV Area (VTI):     1.32 cm    PV VTI:        0.117 m AV Vmax:           95.70 cm/s  PV Peak grad:  2.3 mmHg AV Vmean:          62.900 cm/s PV Mean grad:  1.0 mmHg AV VTI:            0.162 m AV Peak Grad:      3.7 mmHg AV Mean Grad:      2.0 mmHg LVOT Vmax:         56.90 cm/s LVOT Vmean:        34.400 cm/s LVOT VTI:          0.084 m LVOT/AV VTI ratio: 0.52  AORTA Ao Root diam: 2.80 cm MITRAL VALVE                         TRICUSPID VALVE MV Area (PHT): 4.89 cm              TR Peak grad:   35.2 mmHg MV Peak grad:  6.2 mmHg              TR Vmax:        304.00 cm/s MV Mean grad:  2.0 mmHg MV Vmax:       1.24 m/s               SHUNTS MV Vmean:      65.6 cm/s             Systemic VTI:  0.08 m MV VTI:  0.29 m                Systemic Diam: 1.80 cm MV PHT:        44.95 msec MV Decel Time: 155 msec MV E velocity: 107.00 cm/s 103 cm/s MV A velocity: 39.80 cm/s  70.3 cm/s MV E/A ratio:  2.69        1.5  Isaias Cowman MD Electronically signed by Isaias Cowman MD Signature Date/Time: 06/18/2019/1:40:24 PM    Final      Echo LVEF 40 to 45%  TELEMETRY: Sinus rhythm 65 to 70 bpm:  ASSESSMENT AND PLAN:  Active Problems:   Altered level of consciousness    1.  Bradycardia, atrial fibrillation with slow ventricular rate versus junctional rhythm, status post temporary transvenous pacemaker via right femoral vein, currently in sinus rhythm at 65 bpm without need for pacemaker for greater than 24 hours 2.  Paroxysmal atrial fibrillation, chads vas score of 2, underlying Parkinson's, EtOH abuse, poor medical and dietary noncompliance, poor general health, poor candidate for chronic anticoagulation 3.  Acute renal failure, likely ATN, secondary to dehydration 4.  Altered mental status/encephalopathy, likely multifactorial 5.  Borderline elevated troponin (201, 214), Ackley demand supply ischemia, not due to acute coronary syndrome 6.  Mild cardiomyopathy, LVEF 40 to 45% 7.  Rhabdomyolysis, total CK 3496 8.  Liver failure, AST 3267 9.  Multiorgan failure, poor prognosis  Recommendations  1.  Agree with current therapy 2.  Removed transvenous temporary pacemaker 3.  Defer chronic anticoagulation 4.  Defer permanent pacemaker implantation 5.  Defer further cardiac diagnostics  Sign off for now, please call if any questions  Isaias Cowman, MD, PhD, [facility] 06/03/2019 8:30 AM

## 2019-06-03 NOTE — Progress Notes (Signed)
[facility], [place] 06/03/19  Subjective:   Hospital day # 3  Neuro: sedation off Cvs: off pressors Pulm: Fio2 35%, vent ssisted Gi:OGT  Renal: 12/10 0701 - 12/11 0700 In: 1096.1 [I.V.:1096.1] Out: 175 [Urine:150; Emesis/NG output:25] Lab Results  Component Value Date   CREATININE 4.10 (H) 06/03/2019   CREATININE 2.72 (H) 06/02/2019   CREATININE 1.90 (H) 05/28/2019     Objective:  Vital signs in last 24 hours:  Temp:  [98.8 F (37.1 C)-100.6 F (38.1 C)] 100.6 F (38.1 C) (12/11 0500) Pulse Rate:  [58-83] 67 (12/11 0600) Resp:  [7-34] 7 (12/11 0600) BP: (90-158)/(39-123) 109/56 (12/11 0600) SpO2:  [87 %-100 %] 97 % (12/11 0811) FiO2 (%):  [24 %-40 %] 35 % (12/11 0811) Weight:  [66.6 kg] 66.6 kg (12/11 0444)  Weight change: 3.8 kg Filed Weights   06/17/2019 0438 06/02/19 0422 06/03/19 0444  Weight: 61.4 kg 62.8 kg 66.6 kg    Intake/Output:    Intake/Output Summary (Last 24 hours) at 06/03/2019 1106 Last data filed at 06/03/2019 0600 Gross per 24 hour  Intake 1096.08 ml  Output 175 ml  Net 921.08 ml     Physical Exam: General: Critically ill appearing  HEENT ETT, OGT  Pulm/lungs Vent assisted,   CVS/Heart regular , in 70's  Abdomen:  Soft, non distended  Extremities: trace edema  Neurologic:  Able to follow simple commands  Skin: warm   foley       Basic Metabolic Panel:  Recent Labs  Lab 06/16/2019 2049 06/23/2019 0001 06/20/2019 0429 06/02/19 0445 06/03/19 0538  NA 137 137 139 147* 145  K 5.7* 4.8 4.0 3.6 3.5  CL 106 102 104 102 101  CO2 12* 15* 18* 24 22  GLUCOSE 184* 224* 291* 116* 136*  BUN 68* 62* 60* 71* 86*  CREATININE 2.04* 2.02* 1.90* 2.72* 4.10*  CALCIUM 7.9* 7.4* 6.8* 6.6* 6.7*  MG  --   --   --   --  1.9     CBC: Recent Labs  Lab 05/30/2019 1228 06/03/2019 2049 06/17/2019 0429 05/30/2019 1602 06/02/19 0445 06/02/19 1005 06/02/19 1634 06/03/19 0538  WBC 16.0* 23.1* 27.4*  --  20.8*  --   --   20.7*  NEUTROABS 12.4* 18.3*  --   --  16.5*  --   --   --   HGB 11.6* 11.2* 14.3 13.3 14.7 13.7 14.5 13.4  HCT 36.4 37.3 45.0 40.5 44.8 42.1 44.5 40.9  MCV 91.2 98.9 91.1  --  87.3  --   --  87.6  PLT 381 244 158  --  167  --   --  147*      Lab Results  Component Value Date   HEPBSAG NON REACTIVE 06/16/2019   HEPBIGM NON REACTIVE 06/16/2019      Microbiology:  Recent Results (from the past 240 hour(s))  SARS CORONAVIRUS 2 (TAT 6-24 HRS) Nasopharyngeal Nasopharyngeal Swab     Status: None   Collection Time: 06/18/2019  2:54 PM   Specimen: Nasopharyngeal Swab  Result Value Ref Range Status   SARS Coronavirus 2 NEGATIVE NEGATIVE Final    Comment: (NOTE) SARS-CoV-2 target nucleic acids are NOT DETECTED. The SARS-CoV-2 RNA is generally detectable in upper and lower respiratory specimens during the acute phase of infection. Negative results do not preclude SARS-CoV-2 infection, do not rule out co-infections with other pathogens, and should not be used as the sole basis for treatment or other patient management decisions. Negative  results must be combined with clinical observations, patient history, and epidemiological information. The expected result is Negative. Fact Sheet for Patients: SugarRoll.be Fact Sheet for Healthcare Providers: https://www.woods-mathews.com/ This test is not yet approved or cleared by the [place] FDA and  has been authorized for detection and/or diagnosis of SARS-CoV-2 by FDA under an Emergency Use Authorization (EUA). This EUA will remain  in effect (meaning this test can be used) for the duration of the COVID-19 declaration under Section 56 4(b)(1) of the Act, 21 U.S.C. section 360bbb-3(b)(1), unless the authorization is terminated or revoked sooner. Performed at [facility], [place] [address], [place], [place] [address]   Urine culture     Status: Abnormal   Collection Time: 06/02/2019  3:05 PM    Specimen: Urine, Random  Result Value Ref Range Status   Specimen Description   Final    URINE, RANDOM Performed at [facility], [address] [place], [place] [address]    Special Requests   Final    NONE Performed at [facility], [address] [place], [place] [address]    Culture >=100,000 COLONIES/mL ESCHERICHIA COLI (A)  Final   Report Status 06/02/2019 FINAL  Final   Organism ID, Bacteria ESCHERICHIA COLI (A)  Final      Susceptibility   Escherichia coli - MIC*    AMPICILLIN >=32 RESISTANT Resistant     CEFAZOLIN <=4 SENSITIVE Sensitive     CEFTRIAXONE <=1 SENSITIVE Sensitive     CIPROFLOXACIN <=0.25 SENSITIVE Sensitive     GENTAMICIN <=1 SENSITIVE Sensitive     IMIPENEM <=0.25 SENSITIVE Sensitive     NITROFURANTOIN <=16 SENSITIVE Sensitive     TRIMETH/SULFA <=20 SENSITIVE Sensitive     AMPICILLIN/SULBACTAM 16 INTERMEDIATE Intermediate     PIP/TAZO <=4 SENSITIVE Sensitive     * >=100,000 COLONIES/mL ESCHERICHIA COLI  CULTURE, BLOOD (ROUTINE X 2) w Reflex to ID Panel     Status: None (Preliminary result)   Collection Time: 06/18/2019  6:18 PM   Specimen: BLOOD  Result Value Ref Range Status   Specimen Description BLOOD BLOOD RIGHT HAND  Final   Special Requests   Final    BOTTLES DRAWN AEROBIC AND ANAEROBIC Blood Culture adequate volume   Culture   Final    NO GROWTH 3 DAYS Performed at [facility], [address], [place], [place] [address]    Report Status PENDING  Incomplete  Respiratory Panel by RT PCR (Flu A&B, Covid) - Nasopharyngeal Swab     Status: None   Collection Time: 06/23/2019  9:14 PM   Specimen: Nasopharyngeal Swab  Result Value Ref Range Status   SARS Coronavirus 2 by RT PCR NEGATIVE NEGATIVE Final    Comment: (NOTE) SARS-CoV-2 target nucleic acids are NOT DETECTED. The SARS-CoV-2 RNA is generally detectable in upper respiratoy specimens during the acute phase of infection. The lowest concentration of SARS-CoV-2  viral copies this assay can detect is 131 copies/mL. A negative result does not preclude SARS-Cov-2 infection and should not be used as the sole basis for treatment or other patient management decisions. A negative result may occur with  improper specimen collection/handling, submission of specimen other than nasopharyngeal swab, presence of viral mutation(s) within the areas targeted by this assay, and inadequate number of viral copies (<131 copies/mL). A negative result must be combined with clinical observations, patient history, and epidemiological information. The expected result is Negative. Fact Sheet for Patients:  PinkCheek.be Fact Sheet for Healthcare Providers:  GravelBags.it This test is not yet ap proved or cleared by the [place] and  has been authorized for detection and/or diagnosis of SARS-CoV-2 by FDA under an Emergency Use Authorization (EUA). This EUA will remain  in effect (meaning this test can be used) for the duration of the COVID-19 declaration under Section 564(b)(1) of the Act, 21 U.S.C. section 360bbb-3(b)(1), unless the authorization is terminated or revoked sooner.    Influenza A by PCR NEGATIVE NEGATIVE Final   Influenza B by PCR NEGATIVE NEGATIVE Final    Comment: (NOTE) The Xpert Xpress SARS-CoV-2/FLU/RSV assay is intended as an aid in  the diagnosis of influenza from Nasopharyngeal swab specimens and  should not be used as a sole basis for treatment. Nasal washings and  aspirates are unacceptable for Xpert Xpress SARS-CoV-2/FLU/RSV  testing. Fact Sheet for Patients: PinkCheek.be Fact Sheet for Healthcare Providers: GravelBags.it This test is not yet approved or cleared by the [place] FDA and  has been authorized for detection and/or diagnosis of SARS-CoV-2 by  FDA under an Emergency Use Authorization (EUA). This EUA will  remain  in effect (meaning this test can be used) for the duration of the  Covid-19 declaration under Section 564(b)(1) of the Act, 21  U.S.C. section 360bbb-3(b)(1), unless the authorization is  terminated or revoked. Performed at [facility], [address] [place], [place] [address]   MRSA PCR Screening     Status: None   Collection Time: 06/14/2019 10:35 PM   Specimen: Nasopharyngeal  Result Value Ref Range Status   MRSA by PCR NEGATIVE NEGATIVE Final    Comment:        The GeneXpert MRSA Assay (FDA approved for NASAL specimens only), is one component of a comprehensive MRSA colonization surveillance program. It is not intended to diagnose MRSA infection nor to guide or monitor treatment for MRSA infections. Performed at [facility], [address] [place], [place] [address]     Coagulation Studies: Recent Labs    06/09/2019 0001 05/30/2019 0429 06/02/19 0445  LABPROT 50.1* 56.9* 36.7*  INR 5.5* 6.5* 3.7*    Urinalysis: Recent Labs    06/09/2019 1454  COLORURINE AMBER*  LABSPEC 1.019  PHURINE 7.0  GLUCOSEU NEGATIVE  HGBUR NEGATIVE  BILIRUBINUR NEGATIVE  KETONESUR 5*  PROTEINUR >=300*  NITRITE NEGATIVE  LEUKOCYTESUR NEGATIVE      Imaging: EEG  Result Date: 06/13/2019 Lora Havens, MD     06/20/2019  5:22 PM Patient Name: YUNUEN MORDAN MRN: [id number] Epilepsy Attending: Lora Havens Referring Physician/Provider: Darel Hong, NP Date: 06/05/2019 Duration: 24.43mns Patient history: 89yoF with ams. EEG to evaluate for seizure Level of alertness: comatose AEDs during EEG study: Librium, lorazepam Technical aspects: This EEG study was done with scalp electrodes positioned according to the 10-20 International system of electrode placement. Electrical activity was acquired at a sampling rate of '500Hz'$  and reviewed with a high frequency filter of '70Hz'$  and a low frequency filter of '1Hz'$ . EEG data were recorded continuously and digitally  stored. DESCRIPTION: EEG showed continuous generalized 3-'5hz'$  theta-delta slowing admixed with an excessive amount of 15 to 18 Hz, 2-3 uV beta activity with irregular morphology distributed symmetrically and diffusely. Hyperventilation and photic stimulation were not performed. Episode of left shoulder and feet and head twitching was recorded per eeg tech annotation ( difficult to visualize on video). Concomitant eeg before, during and after the event didn't show any eeg change to suggest seizure. ABNORMALITY - Continuous slow, generalized -  Excessive beta, generalized IMPRESSION: This study is suggestive of severe diffuse encephalopathy, non specific to etiology.  The excessive beta activity seen in the background is most likely due to the effect of benzodiazepine and is a benign EEG pattern. One episode of left shoulder, feet and head twitching was recorded without EEG change and was likely non epileptic. No definite seizures or epileptiform discharges were seen throughout the recording. Clinical correlation is recommended Priyanka Barbra Sarks   US RENAL  Result Date: 05/27/2019 CLINICAL DATA:  AKI EXAM: RENAL / URINARY TRACT ULTRASOUND COMPLETE COMPARISON:  None. FINDINGS: Right Kidney: Renal measurements: 10.4 x 4.5 x 5.6 cm = volume: 136 mL . Echogenicity is increased. No mass or hydronephrosis visualized. Left Kidney: Renal measurements: 10.2 x 5.3 x 4.3 cm = volume: 123 mL. Echogenicity is increased. No mass or hydronephrosis visualized. Bladder: Decompressed by Foley catheter. Other: Cholelithiasis, sludge, mild gallbladder wall thickening. Mild perinephric fluid. Free fluid is present in the pelvis. IMPRESSION: No hydronephrosis. Increased renal echogenicity reflecting medical renal disease. Mild free fluid. Gallbladder stones, sludge, and wall thickening. Electronically Signed   By: Macy Mis M.D.   On: 05/26/2019 11:35   DG Chest Port 1 View  Result Date: 06/02/2019 CLINICAL DATA:  Acute  respiratory failure. EXAM: PORTABLE CHEST 1 VIEW COMPARISON:  Radiograph 06/12/2019 FINDINGS: Endotracheal tube tip 17 mm from the carina. Enteric tube tip and side-port below the diaphragm in the stomach. Right internal jugular central venous catheter tip in the upper SVC. Pacemaker from an inferior approach projects over the expected right ventricle. Increasing hazy opacity at the lung bases consistent with pleural effusions and atelectasis. Vascular congestion. No pneumothorax. IMPRESSION: 1. Worsening bilateral pleural effusions, right greater than left. 2. Vascular congestion. 3. Temporary pacemaker placement. Support apparatus otherwise unchanged. Electronically Signed   By: Keith Rake M.D.   On: 06/02/2019 05:08     Medications:   . dexmedetomidine (PRECEDEX) IV infusion Stopped (06/04/2019 2258)  . dextrose 5 % with KCl 20 mEq / L 50 mL/hr at 06/03/19 0600  . norepinephrine (LEVOPHED) Adult infusion Stopped (06/19/2019 0045)  . pantoprazole (PROTONIX) IVPB    . pantoprozole (PROTONIX) infusion 8 mg/hr (06/03/19 0600)  . piperacillin-tazobactam (ZOSYN)  IV     . sodium chloride   Intravenous Once  . sodium chloride   Intravenous Once  . chlorhexidine gluconate (MEDLINE KIT)  15 mL Mouth Rinse BID  . Chlorhexidine Gluconate Cloth  6 each Topical Daily  . insulin aspart  0-6 Units Subcutaneous Q6H  . ipratropium-albuterol  3 mL Nebulization TID  . LORazepam  1 mg Intravenous Once  . mouth rinse  15 mL Mouth Rinse 10 times per day  . [START ON 06/04/2019] pantoprazole  40 mg Intravenous Q12H  . sodium chloride flush  3 mL Intravenous Q12H  . venlafaxine XR  300 mg Oral Q breakfast   acetaminophen **OR** acetaminophen, albuterol, atropine, fentaNYL (SUBLIMAZE) injection, fentaNYL (SUBLIMAZE) injection, hydrALAZINE, ondansetron **OR** ondansetron (ZOFRAN) IV, senna-docusate  Assessment/ Plan:  63 y.o. female  with Parkinson's, alcohol abuse, anxiety, bipolar disorder, amputation,  history of left hip arthroplasty , right hip replacement, back surgery was admitted on 06/20/2019 with altered mental status, Acute resp failure, coagulopathy and arrhythmia/bradycardia.   #Acute kidney injury. Baseline creatinine of 0.85 on May 20, 2019 Kidney injury is likely secondary to ATN from hemodynamic instability, UTI, sepsis.   #Proteinuria - Obtain protein/cr ratio   #Rhabdomyolysis, mild - CK ~ 4000  #Atrial fibrillation with bradycardia,  requiring temporary pacemaker placement -Cardiology team following  #Acute respiratory failure.   Intubated in ER December 8  #Acidosis -Likely multifactorial due to sepsis and renal failure  #Altered mental status -Due to sepsis  #GI bleed,  Coagulopathy, reported history of alcohol abuse. CT abdomen does not report cirrhosis but ascites and cholelithiasis and gallbladder sludge is noted.  #Hyperglycemia.   Hemoglobin A1c 5.2% June 01, 2019  # Pulm HTN echo 06/17/2019. LVEF 40-45%  # UTI, E Coli, 06/19/2019  #Shock liver    Plan:  Electrolytes and volume status are acceptable.  Patient is currently oligoanuric.   Agree with placement of dialysis catheter as with continuing worsening of renal parameters, she may need hemodialysis in the next 1 to 2 days.  broad spectrum Abx for sepsis as per icu team.  E. coli in urine/   Possible extubation today Off pressors and sedation at present   LOS: [place] 12/11/202011:06 [place], [place]  Note: This note was prepared with Dragon dictation. Any transcription errors are unintentional

## 2019-06-03 NOTE — Consult Note (Signed)
Consultation Note Date: 06/03/2019   Patient Name: [name]  DOB: [birth year]  MRN: [id number]  Age / Sex: 63 y.o., female  PCP: Dion Body, MD Referring Physician: Ottie Glazier, MD  Reason for Consultation: Establishing goals of care  HPI/Patient Profile: 63 y.o. female  with past medical history of recent COVID, Parkinson's disease, neuropathy, UTI, multiple falls, bipolar disorder, ETOH abuse admitted on 06/15/2019 with confusion/AMS r/t AKI, liver shock/failure. Requiring intubation and required transcutaneous pacing for bradycardia (pacing has since stopped and HR stable). Concern for cardiogenic shock vs septic shock vs hemorrhagic shock. Plans for HD catheter plaement  Clinical Assessment and Goals of Care: I met today at [name]'s bedside and she is alert, tracking, and following all commands. Remains intubated and appears frightened and anxious at times but able to speak with her to provide reassurance and calm her.   I met with daughter, [name] at bedside. We discussed her mother's overall declining health over the past few months. We discussed current status with improvement in mental status and hopeful that she can be extubated soon. However, she continues with worsening renal failure and liver failure. She agrees with continued aggressive care and would desire dialysis trial. However, she does agree that her mother would be a very poor candidate for long term dialysis given her overall functional decline and underlying chronic illness. We discussed that she continues to be tenuous and high risk for further complications and decline. [name] tells me that she and her brother have discussed and do desire DNR. Once her mother is extubated they would not want re-intubation to place her back in her current state. They want to allow for opportunity for improvement but do not want her to suffer.   All  questions/concerns addressed. Emotional support provided.     Primary Decision Maker NEXT OF KIN Son [name] and daughter [name]    SUMMARY OF RECOMMENDATIONS   - DNR established - Dialysis trial as indicated (poor candidate for long term dialysis) - Family hopeful for improvement but also prepared for the worse  Code Status/Advance Care Planning:  DNR   Symptom Management:   Per PCCM and renal  Palliative Prophylaxis:   Aspiration, Bowel Regimen, Delirium Protocol, Frequent Pain Assessment, Oral Care and Turn Reposition  Psycho-social/Spiritual:   Desire for further Chaplaincy support:yes  Additional Recommendations: Caregiving  Support/Resources  Prognosis:   Overall prognosis is poor but dependent on progress over the next few days.   Discharge Planning: To Be Determined      Primary Diagnoses: Present on Admission: . Altered level of consciousness   I have reviewed the medical record, interviewed the patient and family, and examined the patient. The following aspects are pertinent.  Past Medical History:  Diagnosis Date  . Anxiety   . Anxiety    panic anxiety disorder  . Arthritis   . Asthma    chronic asthmatic bronchitis  . Bipolar disorder ([place])   . 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 Depression   .  ETOH abuse   . Heart murmur   . Insomnia   . Neuropathy    Social History   Socioeconomic History  . Marital status: Divorced    Spouse name: Not on file  . Number of children: Not on file  . Years of education: Not on file  . Highest education level: Not on file  Occupational History  . Not on file  Tobacco Use  . Smoking status: Current Every Day Smoker    Packs/day: 0.25    Types: Cigarettes  . Smokeless tobacco: Never Used  Substance and Sexual Activity  . Alcohol use: Yes    Comment: 2 40oz beers daily  . Drug use: No    Comment: Patient denies   . Sexual activity: Yes  Other Topics Concern  . Not on file  Social  History Narrative   ** Merged History Encounter **       Social Determinants of Health   Financial Resource Strain:   . Difficulty of Paying Living Expenses: Not on file  Food Insecurity:   . Worried About Charity fundraiser in the Last Year: Not on file  . Ran Out of Food in the Last Year: Not on file  Transportation Needs:   . Lack of Transportation (Medical): Not on file  . Lack of Transportation (Non-Medical): Not on file  Physical Activity:   . Days of Exercise per Week: Not on file  . Minutes of Exercise per Session: Not on file  Stress:   . Feeling of Stress : Not on file  Social Connections:   . Frequency of Communication with Friends and Family: Not on file  . Frequency of Social Gatherings with Friends and Family: Not on file  . Attends Religious Services: Not on file  . Active Member of Clubs or Organizations: Not on file  . Attends Archivist Meetings: Not on file  . Marital Status: Not on file   Family History  Problem Relation Age of Onset  . COPD Mother    Scheduled Meds: . sodium chloride   Intravenous Once  . sodium chloride   Intravenous Once  . chlorhexidine gluconate (MEDLINE KIT)  15 mL Mouth Rinse BID  . Chlorhexidine Gluconate Cloth  6 each Topical Daily  . insulin aspart  0-6 Units Subcutaneous Q6H  . ipratropium-albuterol  3 mL Nebulization TID  . LORazepam  1 mg Intravenous Once  . mouth rinse  15 mL Mouth Rinse 10 times per day  . [START ON 06/04/2019] pantoprazole  40 mg Intravenous Q12H  . sodium chloride flush  3 mL Intravenous Q12H  . venlafaxine XR  300 mg Oral Q breakfast   Continuous Infusions: . dexmedetomidine (PRECEDEX) IV infusion Stopped (05/30/2019 2258)  . dextrose 5 % with KCl 20 mEq / L 50 mL/hr at 06/03/19 0600  . norepinephrine (LEVOPHED) Adult infusion Stopped (06/11/2019 0045)  . pantoprazole (PROTONIX) IVPB    . pantoprozole (PROTONIX) infusion 8 mg/hr (06/03/19 0600)  . piperacillin-tazobactam (ZOSYN)  IV      PRN Meds:.acetaminophen **OR** acetaminophen, albuterol, atropine, fentaNYL (SUBLIMAZE) injection, fentaNYL (SUBLIMAZE) injection, hydrALAZINE, ondansetron **OR** ondansetron (ZOFRAN) IV, senna-docusate Allergies  Allergen Reactions  . Benadryl [Diphenhydramine] Hives and Other (See Comments)    Reaction:  Hyperactivity   . Benadryl [Diphenhydramine] Other (See Comments)    My jaw locked and I could not speak, like a stroke.  . Cephalosporins Hives  . Codeine Nausea And Vomiting   Review of Systems  Unable to  perform ROS: Patient nonverbal    Physical Exam Vitals and nursing note reviewed.  Constitutional:      General: She is awake. She is not in acute distress.    Appearance: She is ill-appearing.     Interventions: She is intubated.  Cardiovascular:     Rate and Rhythm: Normal rate.  Pulmonary:     Effort: No tachypnea or respiratory distress. She is intubated.  Abdominal:     Palpations: Abdomen is soft.  Skin:    Coloration: Skin is jaundiced.  Neurological:     Mental Status: She is alert.     Comments: Following commands and tracking     Vital Signs: BP (!) 109/56   Pulse 67   Temp (!) 100.6 F (38.1 C) (Bladder)   Resp (!) 7   Ht '5\' 5"'$  (1.651 m)   Wt 66.6 kg   SpO2 97%   BMI 24.43 kg/m  Pain Scale: CPOT   Pain Score: Asleep   SpO2: SpO2: 97 % O2 Device:SpO2: 97 % O2 Flow Rate: .O2 Flow Rate (L/min): 2 L/min  IO: Intake/output summary:   Intake/Output Summary (Last 24 hours) at 06/03/2019 0953 Last data filed at 06/03/2019 0600 Gross per 24 hour  Intake 1096.08 ml  Output 175 ml  Net 921.08 ml    LBM: Last BM Date: (PTA) Baseline Weight: Weight: 58.5 kg Most recent weight: Weight: 66.6 kg     Palliative Assessment/Data:     Time In: 1300 Time Out: 1410 Time Total: 70 min Greater than 50%  of this time was spent counseling and coordinating care related to the above assessment and plan.  Signed by: Vinie Sill, NP Palliative  Medicine Team Pager # [phone] (M-F 8a-5p) Team Phone # [phone] (Nights/Weekends)

## 2019-06-03 NOTE — TOC Initial Note (Signed)
Transition of Care [facility]) - Initial/Assessment Note    Patient Details  Name: [name] MRN: [id number] Date of Birth: [birth year]  Transition of [facility]) CM/SW Contact:    Shade Flood, LCSW Phone Number: 06/03/2019, 3:02 PM  Clinical Narrative:                  Pt admitted from home. She has been active with [name]. Pt has had several hospitalizations recently. Palliative is consulted. Pt has also had personal care services at home.   Unable to assess pt today. Awaiting extubation. Pt has Parkinsons and she is also potentially going to need HD here.  TOC will follow.  Expected Discharge Plan: [place] Barriers to Discharge: Continued Medical Work up   Patient Goals and CMS Choice        Expected Discharge Plan and Services Expected Discharge Plan: [place] In-house Referral: Clinical Social Work     Living arrangements for the past 2 months: [place], [place]                                      Prior Living Arrangements/Services Living arrangements for the past 2 months: [place], [place] Lives with:: Self Patient language and need for interpreter reviewed:: Yes Do you feel safe going back to the place where you live?: Yes      Need for Family Participation in Patient Care: Yes (Comment) Care giver support system in place?: Yes (comment) Current home services: Home RN Criminal Activity/Legal Involvement Pertinent to Current Situation/Hospitalization: No - Comment as needed  Activities of Daily Living      Permission Sought/Granted                  Emotional Assessment Appearance:: Appears stated age Attitude/Demeanor/Rapport: Unable to Assess Affect (typically observed): Unable to Assess Orientation: : Oriented to Self Alcohol / Substance Use: Alcohol Use Psych Involvement: No (comment)  Admission diagnosis:  Dehydration  [E86.0] Hyperkalemia [E87.5] AKI (acute kidney injury) ([place]) [N17.9] Altered mental status, unspecified altered mental status type [R41.82] Altered level of consciousness [R40.4] Patient Active Problem List   Diagnosis Date Noted  . Altered level of consciousness 06/23/2019  . AKI (acute kidney injury) ([place]) 04/13/2019  . Altered mental status   . ARF (acute renal failure) ([place]) 03/31/2019  . Back pain 03/16/2019  . Constipation 03/16/2019  . Parkinson disease ([place]) 03/16/2019  . Murmur 03/16/2019  . Neck pain 03/16/2019  . 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 Essential hypertension 02/22/2019  . At high risk for falls 01/25/2019  . Drug-induced tremor 01/25/2019  . Essential tremor 01/25/2019  . 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 Hip dislocation, left ([place]) 11/29/2018  . Hip dislocation, left, sequela 08/06/2018  . High anion gap metabolic acidosis 99991111  . History of alcohol abuse 10/09/2016  . Neuropathy of left upper extremity 10/09/2016  . Urinary incontinence, mixed 10/09/2016  . Overdose of antidepressant 07/03/2016  . 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 Alcohol use disorder, severe, dependence ([place]) 01/26/2015  . Tobacco use disorder 01/26/2015  . Nicotine dependence, uncomplicated [id number]  . Alcohol withdrawal ([place]) 01/08/2015  . 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 Suicidal ideation 12/19/2014  . Alcohol use 10/30/2014  . Anxiety 10/30/2014  . Depression 10/30/2014  . GAD (generalized anxiety disorder) 12/24/2013  . PTSD (post-traumatic stress disorder) 12/22/2013   PCP:  Dion Body, MD Pharmacy:   [place], [place] - [address] [place] [address] Phone: [phone] Fax: [phone]     Social Determinants of  Health (SDOH) Interventions    Readmission Risk Interventions Readmission Risk Prevention Plan 06/03/2019 06/03/2019 04/15/2019  Transportation Screening - Complete Complete  PCP or Specialist Appt within 5-7 Days - - -  Home Care Screening - - -  Medication Review (RN CM) - - -  [place] or [place] - Complete -  Social Work Consult for [place] Planning/Counseling - Complete -  Palliative Care Screening Complete Not Applicable Not Applicable  Medication Review Press photographer) - Complete Complete  Some recent data might be hidden

## 2019-06-03 NOTE — Consult Note (Signed)
PHARMACY CONSULT NOTE - FOLLOW UP  Pharmacy Consult for Electrolyte Monitoring and Replacement   Recent Labs: Potassium (mmol/L)  Date Value  06/03/2019 3.5  10/11/2014 4.1   Magnesium (mg/dL)  Date Value  06/03/2019 1.9   Calcium (mg/dL)  Date Value  06/03/2019 6.7 (L)   Calcium, Total (mg/dL)  Date Value  10/11/2014 9.1   Albumin (g/dL)  Date Value  06/02/2019 2.6 (L)  10/11/2014 4.3   Phosphorus (mg/dL)  Date Value  06/03/2019 4.0   Sodium (mmol/L)  Date Value  06/03/2019 145  10/11/2014 46   63 year old female admitted with GI bleed on protonix gtt with PMH of neuropathy, parkinson's syndrome, UTI, bipolar, cancer, and history of recurrent falls. Patient is intubated, but currently NPO.  Patient's lactulose was restarted. KCl decreased from 3.6 to 3.5 on D5 with KCl 20 mEq.   Electrolytes Corrected Ca 7.4 K 3.5, but trending down quickly. All other electrolytes wnl Goal:  Electrolytes wnl. Continue D5 with KCl 20 mEq @ 50 mL/h Give potassium KCl 10 mEq IV x 1 dose. Pharmacy will monitor with AM labs  Glucose Currently NPO. Patient was hypoglycemic on admission. BG low, with highest at 133. Currently on very sensitive SSI Q6 h A1c 5.2  Constipation LBM unknown (admit to ICU 12/8) Senokot-S PRN, lactulose 20 g Q6 D5 with KCl 20 mEq currently   Gerald Dexter, PharmD Pharmacy Resident  06/03/2019 2:40 PM

## 2019-06-03 NOTE — Progress Notes (Signed)
Follow up - Critical Care Medicine Note  Patient Details:    [name] is an 63 y.o. female with PMH of ETOH abuse and Parkinson's presented 06/13/2019 with Altered Mental Status.  Found to have AKI, Hypoglycemia, and Leukocytosis.  She decompensated (hypotensive, hypoxic, SOB, worsening mental status) in ED requiring intubation and vasopressors.  Noted to have frank blood in OG tube.  Requiring transcutaneous pacing with plans for transvenous pacer. Concern for Cardiogenic vs. Septic vs. Hemorrhagic shock.  Lines, Airways, Drains: Airway 7.5 mm (Active)  Secured at (cm) 22 cm 05/25/2019 1408  Measured From Lips 06/07/2019 [place] 05/26/2019 1408  Secured By [employer] 06/10/2019 1408  Tube Holder Repositioned Yes 06/23/2019 1408  Cuff Pressure (cm H2O) 24 cm H2O 06/08/2019 1408  Site Condition Dry 06/16/2019 1408     CVC Triple Lumen 06/22/2019 Right Internal jugular (Active)  Indication for Insertion or Continuance of Line Vasoactive infusions 06/07/2019 0800  Site Assessment Clean;Dry;Intact 06/22/2019 0800  Proximal Lumen Status Infusing;Blood return noted 06/10/2019 0800  Medial Lumen Status Infusing;Blood return noted 06/08/2019 0800  Distal Lumen Status Infusing;Blood return noted 05/24/2019 0800  Dressing Type Transparent;Occlusive 06/15/2019 0800  Dressing Status Clean;Dry;Intact 06/07/2019 0800  Dressing Change Due 06/07/19 06/22/2019 0800     Negative Pressure Wound Therapy Hip Left (Active)     NG/OG Tube Orogastric 18 Fr. 50 cm (Active)  Cm Marking at Nare/Corner of Mouth (if applicable) 50 cm 61/95/09 1200  Site Assessment Dry;Intact 06/07/2019 1200  Ongoing Placement Verification No change in cm markings or external length of tube from initial placement 05/25/2019 0400  Status Suction-low intermittent 05/29/2019 1200  Drainage Appearance Bloody 06/18/2019 1200  Output (mL) 0 mL 05/24/2019 0730     Urethral Catheter Claiborne Billings, RN Double-lumen 16 Fr. (Active)  Indication for  Insertion or Continuance of Catheter Unstable critically ill patients first 24-48 hours (See Criteria) 06/19/2019 0800  Site Assessment Clean;Intact 06/04/2019 0800  Catheter Maintenance Bag below level of bladder;Catheter secured;Drainage bag/tubing not touching floor;Insertion date on drainage bag;No dependent loops;Seal intact 05/27/2019 0800  Collection Container Standard drainage bag 05/29/2019 0800  Securement Method Securing device (Describe) 05/28/2019 0800  Urinary Catheter Interventions (if applicable) Unclamped 32/67/12 0800  Output (mL) 15 mL 06/02/2019 1714    Anti-infectives:  Anti-infectives (From admission, onward)   Start     Dose/Rate Route Frequency Ordered Stop   06/03/19 1200  piperacillin-tazobactam (ZOSYN) IVPB 3.375 g     3.375 g 12.5 mL/hr over 240 Minutes Intravenous Every 12 hours 06/03/19 0724     06/03/19 0100  piperacillin-tazobactam (ZOSYN) IVPB 3.375 g  Status:  Discontinued     3.375 g 12.5 mL/hr over 240 Minutes Intravenous Every 8 hours 06/02/19 1714 06/03/19 0724   06/04/2019 2130  piperacillin-tazobactam (ZOSYN) IVPB 3.375 g  Status:  Discontinued     3.375 g 12.5 mL/hr over 240 Minutes Intravenous Every 8 hours 06/03/2019 2123 06/02/19 1709      Microbiology: Results for orders placed or performed during the hospital encounter of 06/12/2019  SARS CORONAVIRUS 2 (TAT 6-24 HRS) Nasopharyngeal Nasopharyngeal Swab     Status: None   Collection Time: 06/08/2019  2:54 PM   Specimen: Nasopharyngeal Swab  Result Value Ref Range Status   SARS Coronavirus 2 NEGATIVE NEGATIVE Final    Comment: (NOTE) SARS-CoV-2 target nucleic acids are NOT DETECTED. The SARS-CoV-2 RNA is generally detectable in upper and lower respiratory specimens during the acute phase of infection. Negative results do  not preclude SARS-CoV-2 infection, do not rule out co-infections with other pathogens, and should not be used as the sole basis for treatment or other patient management  decisions. Negative results must be combined with clinical observations, patient history, and epidemiological information. The expected result is Negative. Fact Sheet for Patients: SugarRoll.be Fact Sheet for Healthcare Providers: https://www.woods-mathews.com/ This test is not yet approved or cleared by the [place] FDA and  has been authorized for detection and/or diagnosis of SARS-CoV-2 by FDA under an Emergency Use Authorization (EUA). This EUA will remain  in effect (meaning this test can be used) for the duration of the COVID-19 declaration under Section 56 4(b)(1) of the Act, 21 U.S.C. section 360bbb-3(b)(1), unless the authorization is terminated or revoked sooner. Performed at [facility], [place] [address] [place], [place] [address]   Urine culture     Status: Abnormal   Collection Time: 06/03/2019  3:05 PM   Specimen: Urine, Random  Result Value Ref Range Status   Specimen Description   Final    URINE, RANDOM Performed at [name], [address] [place], [place] [address]    Special Requests   Final    NONE Performed at [facility], [address] [place], [place] [address]    Culture >=100,000 COLONIES/mL ESCHERICHIA COLI (A)  Final   Report Status 06/02/2019 FINAL  Final   Organism ID, Bacteria ESCHERICHIA COLI (A)  Final      Susceptibility   Escherichia coli - MIC*    AMPICILLIN >=32 RESISTANT Resistant     CEFAZOLIN <=4 SENSITIVE Sensitive     CEFTRIAXONE <=1 SENSITIVE Sensitive     CIPROFLOXACIN <=0.25 SENSITIVE Sensitive     GENTAMICIN <=1 SENSITIVE Sensitive     IMIPENEM <=0.25 SENSITIVE Sensitive     NITROFURANTOIN <=16 SENSITIVE Sensitive     TRIMETH/SULFA <=20 SENSITIVE Sensitive     AMPICILLIN/SULBACTAM 16 INTERMEDIATE Intermediate     PIP/TAZO <=4 SENSITIVE Sensitive     * >=100,000 COLONIES/mL ESCHERICHIA COLI  CULTURE, BLOOD (ROUTINE X 2) w Reflex to ID Panel     Status:  None (Preliminary result)   Collection Time: 06/15/2019  6:18 PM   Specimen: BLOOD  Result Value Ref Range Status   Specimen Description BLOOD BLOOD RIGHT HAND  Final   Special Requests   Final    BOTTLES DRAWN AEROBIC AND ANAEROBIC Blood Culture adequate volume   Culture   Final    NO GROWTH 3 DAYS Performed at [facility], [address] [place], [place] [address]    Report Status PENDING  Incomplete  Respiratory Panel by RT PCR (Flu A&B, Covid) - Nasopharyngeal Swab     Status: None   Collection Time: 05/26/2019  9:14 PM   Specimen: Nasopharyngeal Swab  Result Value Ref Range Status   SARS Coronavirus 2 by RT PCR NEGATIVE NEGATIVE Final    Comment: (NOTE) SARS-CoV-2 target nucleic acids are NOT DETECTED. The SARS-CoV-2 RNA is generally detectable in upper respiratoy specimens during the acute phase of infection. The lowest concentration of SARS-CoV-2 viral copies this assay can detect is 131 copies/mL. A negative result does not preclude SARS-Cov-2 infection and should not be used as the sole basis for treatment or other patient management decisions. A negative result may occur with  improper specimen collection/handling, submission of specimen other than nasopharyngeal swab, presence of viral mutation(s) within the areas targeted by this assay, and inadequate number of viral copies (<131 copies/mL). A negative  result must be combined with clinical observations, patient history, and epidemiological information. The expected result is Negative. Fact Sheet for Patients:  PinkCheek.be Fact Sheet for Healthcare Providers:  GravelBags.it This test is not yet ap proved or cleared by the [place] FDA and  has been authorized for detection and/or diagnosis of SARS-CoV-2 by FDA under an Emergency Use Authorization (EUA). This EUA will remain  in effect (meaning this test can be used) for the duration of  the COVID-19 declaration under Section 564(b)(1) of the Act, 21 U.S.C. section 360bbb-3(b)(1), unless the authorization is terminated or revoked sooner.    Influenza A by PCR NEGATIVE NEGATIVE Final   Influenza B by PCR NEGATIVE NEGATIVE Final    Comment: (NOTE) The Xpert Xpress SARS-CoV-2/FLU/RSV assay is intended as an aid in  the diagnosis of influenza from Nasopharyngeal swab specimens and  should not be used as a sole basis for treatment. Nasal washings and  aspirates are unacceptable for Xpert Xpress SARS-CoV-2/FLU/RSV  testing. Fact Sheet for Patients: PinkCheek.be Fact Sheet for Healthcare Providers: GravelBags.it This test is not yet approved or cleared by the [place] FDA and  has been authorized for detection and/or diagnosis of SARS-CoV-2 by  FDA under an Emergency Use Authorization (EUA). This EUA will remain  in effect (meaning this test can be used) for the duration of the  Covid-19 declaration under Section 564(b)(1) of the Act, 21  U.S.C. section 360bbb-3(b)(1), unless the authorization is  terminated or revoked. Performed at [facility], [address] [place], [place] [address]   MRSA PCR Screening     Status: None   Collection Time: 05/27/2019 10:35 PM   Specimen: Nasopharyngeal  Result Value Ref Range Status   MRSA by PCR NEGATIVE NEGATIVE Final    Comment:        The GeneXpert MRSA Assay (FDA approved for NASAL specimens only), is one component of a comprehensive MRSA colonization surveillance program. It is not intended to diagnose MRSA infection nor to guide or monitor treatment for MRSA infections. Performed at [facility], [address] [place], [place] [address]   CULTURES: Blood x2 12/8>> Urine 12/8>> E. coli Tracheal aspirate 12/8>> SARS-CoV-2 PCR 12/8>> NEGATIVE Influenza PCR 12/8>>NEGATIVE Respiratory Viral Panel 12/8>> NEGATIVE MRSA PCR 12/8>>  NEGATIVE  Best Practice/Protocols:  VTE Prophylaxis: Mechanical GI Prophylaxis: Proton Pump Inhibitor  ANTIBIOTICS: Zosyn 12/8>>    Events: 12/8- Presented to ED 12/8- Decompensated in ED requiring intubation and vasopressors 12/8- right IJ CVC 12/9- Transvenous pacer placed, right femoral approach 12/11 - patient is able to follow some commands, transvenous pacer removed by cardio.  CVP this am 13.  No bloody output per OGT.    Studies: EEG  Result Date: 06/07/2019 Lora Havens, MD     05/25/2019  5:22 PM Patient Name: [name] MRN: [id number] Epilepsy Attending: Lora Havens Referring Physician/Provider: Darel Hong, NP Date: 06/22/2019 Duration: 24.24mns Patient history: 89yoF with ams. EEG to evaluate for seizure Level of alertness: comatose AEDs during EEG study: Librium, lorazepam Technical aspects: This EEG study was done with scalp electrodes positioned according to the 10-20 International system of electrode placement. Electrical activity was acquired at a sampling rate of _0  and reviewed with a high frequency filter of _1  and a low frequency filter of _2 . EEG data were recorded continuously and digitally stored. DESCRIPTION: EEG showed continuous generalized 3-_3  theta-delta slowing admixed with an excessive amount of 15 to 18 Hz, 2-3 uV beta activity  with irregular morphology distributed symmetrically and diffusely. Hyperventilation and photic stimulation were not performed. Episode of left shoulder and feet and head twitching was recorded per eeg tech annotation ( difficult to visualize on video). Concomitant eeg before, during and after the event didn't show any eeg change to suggest seizure. ABNORMALITY - Continuous slow, generalized - Excessive beta, generalized IMPRESSION: This study is suggestive of severe diffuse encephalopathy, non specific to etiology.  The excessive beta activity seen in the background is most likely due to the effect of benzodiazepine  and is a benign EEG pattern. One episode of left shoulder, feet and head twitching was recorded without EEG change and was likely non epileptic. No definite seizures or epileptiform discharges were seen throughout the recording. Clinical correlation is recommended Priyanka Barbra Sarks   CT ABDOMEN PELVIS WO CONTRAST  Result Date: 06/13/2019 CLINICAL DATA:  Abdominal rigidity EXAM: CT ABDOMEN AND PELVIS WITHOUT CONTRAST TECHNIQUE: Multidetector CT imaging of the abdomen and pelvis was performed following the standard protocol without IV contrast. COMPARISON:  Ultrasound from 04/01/2019 FINDINGS: Lower chest: Small right pleural effusion is noted with bibasilar atelectasis right greater than left. No focal parenchymal nodules are seen. Gastric catheter is noted extending into the stomach. Hepatobiliary: Liver is well visualized and within normal limits. Gallbladder demonstrates some mild wall thickening likely reactive related to the ascites. Increased density is noted within consistent with cholelithiasis and gallbladder sludge similar to that noted on prior ultrasound. Pancreas: Unremarkable. No pancreatic ductal dilatation or surrounding inflammatory changes. Spleen: Normal in size without focal abnormality. Adrenals/Urinary Tract: Adrenal glands are within normal limits. Kidneys are well visualized bilaterally. No renal calculi or obstructive changes are seen. The bladder is decompressed by Foley catheter and obscured by bilateral hip replacements. Stomach/Bowel: Scattered diverticular change of the colon is noted without evidence of diverticulitis. No obstructive or inflammatory changes are seen. The appendix is within normal limits. Some inflammatory changes are noted surrounding the second portion of the duodenum and head of the pancreas as well as extending into the mesentery inferiorly. This may be related to some focal duodenitis although may be reactive related to the mild ascites. Vascular/Lymphatic:  Aortic atherosclerosis. No enlarged abdominal or pelvic lymph nodes. Reproductive: Status post hysterectomy. No adnexal masses. Other: No abdominal wall hernia or abnormality. No abdominopelvic ascites. Musculoskeletal: Bilateral hip replacements are noted. Degenerative changes of lumbar spine are seen. IMPRESSION: Small right pleural effusion with bibasilar atelectasis. Cholelithiasis and gallbladder sludge. Mild ascites. Inflammatory changes surrounding duodenum and head of the pancreas likely related to some duodenitis. No perforation or definitive ulcer crater is seen. These changes may be in part related to the underlying ascites as well. No other focal abnormality is noted. Electronically Signed   By: Inez Catalina M.D.   On: 06/14/2019 22:28   CT HEAD WO CONTRAST  Result Date: 06/08/2019 CLINICAL DATA:  Encephalopathy EXAM: CT HEAD WITHOUT CONTRAST TECHNIQUE: Contiguous axial images were obtained from the base of the skull through the vertex without intravenous contrast. COMPARISON:  06/08/2019 FINDINGS: Brain: There is no mass, hemorrhage or extra-axial collection. The size and configuration of the ventricles and extra-axial CSF spaces are normal. The brain parenchyma is normal, without acute or chronic infarction. Vascular: No abnormal hyperdensity of the major intracranial arteries or dural venous sinuses. No intracranial atherosclerosis. Skull: The visualized skull base, calvarium and extracranial soft tissues are normal. Sinuses/Orbits: No fluid levels or advanced mucosal thickening of the visualized paranasal sinuses. No mastoid or middle ear effusion. The  orbits are normal. IMPRESSION: Normal head CT. Electronically Signed   By: Ulyses Jarred M.D.   On: 05/25/2019 02:53   CT Head Wo Contrast  Result Date: 06/16/2019 CLINICAL DATA:  Altered level of consciousness. Confusion. Parkinson's. EXAM: CT HEAD WITHOUT CONTRAST TECHNIQUE: Contiguous axial images were obtained from the base of the skull  through the vertex without intravenous contrast. COMPARISON:  April 13, 2019 FINDINGS: Brain: No subdural, epidural, or subarachnoid hemorrhage identified. Cerebellum, brainstem, and basal cisterns are normal. No mass effect or midline shift. No acute cortical ischemia or infarct identified. Vascular: No hyperdense vessel or unexpected calcification. Skull: Normal. Negative for fracture or focal lesion. Sinuses/Orbits: No acute finding. Other: None. IMPRESSION: 1. No acute intracranial abnormalities are identified. 2. Electronically Signed   By: Dorise Bullion III M.D   On: 06/05/2019 14:29   CARDIAC CATHETERIZATION  Result Date: 06/21/2019 Successful transvenous temporary pacemaker  US RENAL  Result Date: 05/26/2019 CLINICAL DATA:  AKI EXAM: RENAL / URINARY TRACT ULTRASOUND COMPLETE COMPARISON:  None. FINDINGS: Right Kidney: Renal measurements: 10.4 x 4.5 x 5.6 cm = volume: 136 mL . Echogenicity is increased. No mass or hydronephrosis visualized. Left Kidney: Renal measurements: 10.2 x 5.3 x 4.3 cm = volume: 123 mL. Echogenicity is increased. No mass or hydronephrosis visualized. Bladder: Decompressed by Foley catheter. Other: Cholelithiasis, sludge, mild gallbladder wall thickening. Mild perinephric fluid. Free fluid is present in the pelvis. IMPRESSION: No hydronephrosis. Increased renal echogenicity reflecting medical renal disease. Mild free fluid. Gallbladder stones, sludge, and wall thickening. Electronically Signed   By: Macy Mis M.D.   On: 06/04/2019 11:35   DG Chest Port 1 View  Result Date: 06/02/2019 CLINICAL DATA:  Acute respiratory failure. EXAM: PORTABLE CHEST 1 VIEW COMPARISON:  Radiograph 05/25/2019 FINDINGS: Endotracheal tube tip 17 mm from the carina. Enteric tube tip and side-port below the diaphragm in the stomach. Right internal jugular central venous catheter tip in the upper SVC. Pacemaker from an inferior approach projects over the expected right ventricle. Increasing  hazy opacity at the lung bases consistent with pleural effusions and atelectasis. Vascular congestion. No pneumothorax. IMPRESSION: 1. Worsening bilateral pleural effusions, right greater than left. 2. Vascular congestion. 3. Temporary pacemaker placement. Support apparatus otherwise unchanged. Electronically Signed   By: Keith Rake M.D.   On: 06/02/2019 05:08   DG Chest Port 1 View  Result Date: 06/18/2019 CLINICAL DATA:  Central line placement. EXAM: PORTABLE CHEST 1 VIEW COMPARISON:  Radiograph earlier this day. FINDINGS: Tip of the right internal jugular central venous catheter projects over the upper SVC. No visualized pneumothorax. Endotracheal tube and enteric tube remain in place. Borderline cardiomegaly. Unchanged vascular congestion. No new airspace disease. Streaky bibasilar atelectasis. IMPRESSION: 1. Tip of the right internal jugular central venous catheter projects over the upper SVC. No pneumothorax. 2. Unchanged vascular congestion. 3. Streaky bibasilar atelectasis. Electronically Signed   By: Keith Rake M.D.   On: 05/25/2019 00:19   DG Chest Portable 1 View  Result Date: 06/11/2019 CLINICAL DATA:  Respiratory distress. Endotracheal tube and OG tube placement. EXAM: PORTABLE CHEST 1 VIEW COMPARISON:  06/11/2019 FINDINGS: Endotracheal tube is noted with tip 2.8 cm above the carina. An OG tube is noted entering the stomach with tip just off of the field of view. This is a mildly low volume film. Cardiomegaly and mild pulmonary vascular congestion noted. No pneumothorax or pleural effusion. Defibrillator/pacing pads overlying the chest noted. No acute bony abnormalities are identified. IMPRESSION: 1. Endotracheal tube with tip  2.8 cm above the carina. 2. OG tube entering the stomach with tip just off of the field of view. 3. Cardiomegaly with mild pulmonary vascular congestion. Electronically Signed   By: Margarette [place] M.D.   On: 05/29/2019 20:51   DG Chest Portable 1 View  Result  Date: 06/20/2019 CLINICAL DATA:  Altered mental status. EXAM: PORTABLE CHEST 1 VIEW COMPARISON:  April 17, 2019. FINDINGS: The heart size and mediastinal contours are within normal limits. Both lungs are clear. The visualized skeletal structures are unremarkable. IMPRESSION: No active disease. Electronically Signed   By: Marijo Conception M.D.   On: 06/08/2019 14:02   DG Abd Portable 1 View  Result Date: 06/08/2019 CLINICAL DATA:  OG tube placement. EXAM: PORTABLE ABDOMEN - 1 VIEW COMPARISON:  None. FINDINGS: An OG tube is noted with tip overlying the mid stomach. Bowel gas pattern is unremarkable. IMPRESSION: 1. OG tube with tip overlying the mid stomach. 2. Unremarkable bowel gas pattern. Electronically Signed   By: Margarette [place] M.D.   On: 06/11/2019 20:52   ECHOCARDIOGRAM COMPLETE  Result Date: 05/26/2019   ECHOCARDIOGRAM REPORT   Patient Name:   Dhruvi D Dotts Date of Exam: 06/20/2019 Medical Rec #:  [id number]     Height:       65.0 in Accession #:    [id number]    Weight:       135.4 lb Date of Birth:  Jun 09, 1956     BSA:          1.68 m Patient Age:    88 years      BP:           133/70 mmHg Patient Gender: F             HR:           70 bpm. Exam Location:  [facility] Procedure: 2D Echo, Color Doppler and Cardiac Doppler Indications:     R55 Syncope  History:         Patient has no prior history of Echocardiogram examinations.                  ETOH abuse.  Sonographer:     Charmayne Sheer RDCS (AE) Referring Phys:  [id number] TAWFIKUL ALAM Diagnosing Phys: Isaias Cowman MD  Sonographer Comments: Echo performed with patient supine and on artificial respirator. IMPRESSIONS  1. Left ventricular ejection fraction, by visual estimation, is 40 to 45%. The left ventricle has normal function. There is no left ventricular hypertrophy.  2. Mildly dilated left ventricular internal cavity size.  3. The left ventricle has no regional wall motion abnormalities.  4. Global right ventricle has normal systolic function.The right  ventricular size is normal. No increase in right ventricular wall thickness.  5. Left atrial size was mild-moderately dilated.  6. Right atrial size was normal.  7. The mitral valve is normal in structure. Moderate mitral valve regurgitation. No evidence of mitral stenosis.  8. The tricuspid valve is normal in structure. Tricuspid valve regurgitation is not demonstrated.  9. The aortic valve is normal in structure. Aortic valve regurgitation is not visualized. No evidence of aortic valve sclerosis or stenosis. 10. The pulmonic valve was normal in structure. Pulmonic valve regurgitation is not visualized. 11. Moderately elevated pulmonary artery systolic pressure. 12. The inferior vena cava is normal in size with greater than 50% respiratory variability, suggesting right atrial pressure of 3 mmHg. FINDINGS  Left Ventricle: Left ventricular ejection fraction, by visual estimation, is 40  to 45%. The left ventricle has normal function. The left ventricle has no regional wall motion abnormalities. The left ventricular internal cavity size was mildly dilated left ventricle. There is no left ventricular hypertrophy. Normal left atrial pressure. Right Ventricle: The right ventricular size is normal. No increase in right ventricular wall thickness. Global RV systolic function is has normal systolic function. The tricuspid regurgitant velocity is 2.96 m/s, and with an assumed right atrial pressure  of 10 mmHg, the estimated right ventricular systolic pressure is moderately elevated at 45.2 mmHg. Left Atrium: Left atrial size was mild-moderately dilated. Right Atrium: Right atrial size was normal in size Pericardium: There is no evidence of pericardial effusion. Mitral Valve: The mitral valve is normal in structure. Moderate mitral valve regurgitation. No evidence of mitral valve stenosis by observation. MV peak gradient, 6.2 mmHg. Tricuspid Valve: The tricuspid valve is normal in structure. Tricuspid valve regurgitation is  not demonstrated. Aortic Valve: The aortic valve is normal in structure. Aortic valve regurgitation is not visualized. The aortic valve is structurally normal, with no evidence of sclerosis or stenosis. Aortic valve mean gradient measures 2.0 mmHg. Aortic valve peak gradient measures 3.7 mmHg. Aortic valve area, by VTI measures 1.32 cm. Pulmonic Valve: The pulmonic valve was normal in structure. Pulmonic valve regurgitation is not visualized. Pulmonic regurgitation is not visualized. Aorta: The aortic root, ascending aorta and aortic arch are all structurally normal, with no evidence of dilitation or obstruction. Venous: The inferior vena cava is normal in size with greater than 50% respiratory variability, suggesting right atrial pressure of 3 mmHg. IAS/Shunts: No atrial level shunt detected by color flow Doppler. There is no evidence of a patent foramen ovale. No ventricular septal defect is seen or detected. There is no evidence of an atrial septal defect.  LEFT VENTRICLE PLAX 2D LVIDd:         4.94 cm LVIDs:         4.14 cm LV PW:         0.84 cm LV IVS:        0.65 cm LVOT diam:     1.80 cm LV SV:         39 ml LV SV Index:   23.20 LVOT Area:     2.54 cm  LV Volumes (MOD) LV area d, A2C:    20.50 cm LV area d, A4C:    20.50 cm LV area s, A2C:    14.00 cm LV area s, A4C:    15.60 cm LV major d, A2C:   6.47 cm LV major d, A4C:   5.89 cm LV major s, A2C:   5.74 cm LV major s, A4C:   5.62 cm LV vol d, MOD A2C: 53.7 ml LV vol d, MOD A4C: 57.7 ml LV vol s, MOD A2C: 28.2 ml LV vol s, MOD A4C: 36.1 ml LV SV MOD A2C:     25.5 ml LV SV MOD A4C:     57.7 ml LV SV MOD BP:      26.1 ml RIGHT VENTRICLE RV Basal diam:  3.13 cm LEFT ATRIUM             Index       RIGHT ATRIUM           Index LA diam:        4.20 cm 2.51 cm/m  RA Area:     17.40 cm LA Vol (A2C):   80.6 ml 48.10 ml/m RA Volume:   48.40  ml  28.88 ml/m LA Vol (A4C):   78.9 ml 47.08 ml/m LA Biplane Vol: 82.7 ml 49.35 ml/m  AORTIC VALVE                    PULMONIC VALVE AV Area (Vmax):    1.51 cm    PV Vmax:       0.75 m/s AV Area (Vmean):   1.39 cm    PV Vmean:      48.300 cm/s AV Area (VTI):     1.32 cm    PV VTI:        0.117 m AV Vmax:           95.70 cm/s  PV Peak grad:  2.3 mmHg AV Vmean:          62.900 cm/s PV Mean grad:  1.0 mmHg AV VTI:            0.162 m AV Peak Grad:      3.7 mmHg AV Mean Grad:      2.0 mmHg LVOT Vmax:         56.90 cm/s LVOT Vmean:        34.400 cm/s LVOT VTI:          0.084 m LVOT/AV VTI ratio: 0.52  AORTA Ao Root diam: 2.80 cm MITRAL VALVE                         TRICUSPID VALVE MV Area (PHT): 4.89 cm              TR Peak grad:   35.2 mmHg MV Peak grad:  6.2 mmHg              TR Vmax:        304.00 cm/s MV Mean grad:  2.0 mmHg MV Vmax:       1.24 m/s              SHUNTS MV Vmean:      65.6 cm/s             Systemic VTI:  0.08 m MV VTI:        0.29 m                Systemic Diam: 1.80 cm MV PHT:        44.95 msec MV Decel Time: 155 msec MV E velocity: 107.00 cm/s 103 cm/s MV A velocity: 39.80 cm/s  70.3 cm/s MV E/A ratio:  2.69        1.5  Isaias Cowman MD Electronically signed by Isaias Cowman MD Signature Date/Time: 06/16/2019/1:40:24 PM    Final     Consults: Treatment Team:  Isaias Cowman, MD Tyler Pita, MD Leotis Pain, MD Murlean Iba, MD Efrain Sella, MD   Subjective:    Overnight Issues: Intubated and mechanically ventilated.  Remains encephalopathic  Objective:  Vital signs for last 24 hours: Temp:  [98.8 F (37.1 C)-100.6 F (38.1 C)] 100.6 F (38.1 C) (12/11 0500) Pulse Rate:  [58-83] 67 (12/11 0600) Resp:  [7-34] 7 (12/11 0600) BP: (90-158)/(39-123) 109/56 (12/11 0600) SpO2:  [87 %-100 %] 97 % (12/11 0811) FiO2 (%):  [24 %-40 %] 35 % (12/11 0811) Weight:  [66.6 kg] 66.6 kg (12/11 0444)  Hemodynamic parameters for last 24 hours: CVP:  [13 mmHg-21 mmHg] 13 mmHg  Intake/Output from previous day: 12/10 0701 - 12/11 0700 In: 1096.1 [I.V.:1096.1] Out: 175  [Urine:150;  Emesis/NG output:25]  Intake/Output this shift: No intake/output data recorded.  Vent settings for last 24 hours: Vent Mode: PRVC FiO2 (%):  [24 %-40 %] 35 % Set Rate:  [16 bmp] 16 bmp Vt Set:  [400 mL] 400 mL PEEP:  [5 cmH20-8 cmH20] 5 cmH20  Physical Exam:  General: Critically ill appearing female, intubated, synchronous with the vent, Neuro:  Unresponsive on no sedation, pupils fixed and dilated (6 mm bilaterally), no corneal reflex, no cough/gag reflex, opens eyes, some nystagmus noted, no tracking HEENT:  Atraumatic, normocephalic, neck supple, no JVD Cardiovascular:  Irregular rhythm, bradycardia Lungs:  Coarse breath sounds throughout, vent assisted Abdomen:  Soft, nontender, nondistended, no guarding or rebound tenderness, Hypoactive bowel sounds Musculoskeletal:  LUE amputation Skin:  Cool, mottled, no obvious rashes, lesions, or ulcerations  Assessment/Plan:  PULMONARY A: Acute Hypoxic Respiratory Failure, likely secondary to severe metabolic acidosis and pulmonary vascular congestion Hx: Asthma, no exacerbation P:   -Full vent support -Follow intermittent CXR & ABG as needed -Wean FiO2 & PEEP as tolerated -Spontaneous breathing trials when respiratory parameters met -PRN Bronchodilators   CARDIOVASCULAR A:  Hypotension, likely Cardiogenic shock vs.Septic shock vs.Hemorrhagic shock Bradycardia P:  -Continuous cardiac monitoring -Maintain MAP >65 -IV fluids -Levophed as needed to maintain MAP goal -Trend CVP -Cardiology following, appreciate input -Transvenous pacer has helped hemodynamics -Trend troponin -Check BNP -Echocardiogram performed EF is 40 to 45%, normal RV, moderate MR  RENAL A:   AKI likely ATN Anion gap Metabolic acidosis Rhabdomyolysis Hyperkalemia>>resolved Hyponatremia>>resolved P:   -Monitor I&O's / urinary output -Follow BMP -Ensure adequate renal perfusion -Avoid nephrotoxic agents as able, pharmacy consultation  for dosing of medications -Replace electrolytes as indicated -Nephrology consulted,-will place dialysis catheter today due to oliguric worsening AKI -IV fluids -Follow serum CK -Renal US: No hydronephrosis, medical renal disease -Bicarb gtt, discontinued   GASTROINTESTINAL A:   GI Bleed Cholelithiasis w/ Gallbladder sludge CT Abdomen & Pelvis w/ Duodenitis Elevated LFT's, likely shock liver Hyperammonemia P:   -NPO -Protonix gtt -GI consulted, appreciate input, discussed with Dr. Alice Reichert, check acetaminophen level, empiric NAC -Transfuse as indicated -Consider General Surgery consult -Trend LFT's & Ammonia -Check Hepatitis panel  HEMATOLOGIC A:   Acute blood loss anemia Supratherapeutic INR, likely in setting of liver failure vs. DIC P:  -Monitor for S/Sx of bleeding -Trend CBC -SCD's for VTE Prophylaxis (no chemical prophylaxis given GI bleed) -Transfuse for Hgb <8 -Trend bleeding times -Check DIC panel -Consider FFP & Vitamin K   INFECTIOUS A:   Leukocytosis Meets SIRS Criteria, CT Abdomen with Duodenitis P:   -Monitor fever curve -Trend WBC's & Procalcitonin -Continue empiric Zosyn -Follow pan cultures as above -CXR without infiltrate -CT Abdomen with Cholelithiasis and gallbladder slude, and with Duodenitis (no perforation noted) -will narrow to cipro/flagyl -  Nasal mrsa pcr is negative  ENDOCRINE A:   Hypoglycemia   P:   -CBG's -Follow ICU Hypo/hyperglycemia protocol -D5 infusion  NEUROLOGIC A:   Altered mental status, now unresponsive without corneal & gag/cough reflexes; concern for anoxic injury Hx: Parkinson's, Bipolar disorder P:   -RASS goal: 0 to -1 -Fentanyl pushes as needed to maintain RASS goal -Daily wake up assessments -CT head negative 12/8 -Will repeat CT Head due to poor Neuro exam upon arrival to ICU>> repeat CT Head normal -Obtained EEG: No seizures, diffuse encephalopathy, nonspecific.  Excessive beta activity  consistent with benzodiazepine -Consult Neurology, appreciate input -Urine drug screen positive for benzodiazepines -On standing dose Librium for potential EtOH withdrawal,  discontinue    LOS: 3 days   Critical care provider statement:    Critical care time (minutes):  33   Critical care time was exclusive of:  Separately billable procedures and  treating other patients   Critical care was necessary to treat or prevent imminent or  life-threatening deterioration of the following conditions:   Circulatory shock, acute blood loss anemia GI bleed, altered mental status with encephalopathy, coagulopathy, severe AKI, multiple comorbid conditions   Critical care was time spent personally by me on the following  activities:  Development of treatment plan with patient or surrogate,  discussions with consultants, evaluation of patient's response to  treatment, examination of patient, obtaining history from patient or  surrogate, ordering and performing treatments and interventions, ordering  and review of laboratory studies and re-evaluation of patient's condition   I assumed direction of critical care for this patient from another  provider in my specialty: no     *Care during the described time interval was provided by me and/or other providers on the critical care team.  I have reviewed this patient's available data, including medical history, events of note, physical examination and test results as part of my evaluation.  **This note was dictated using voice recognition software/Dragon.  Despite best efforts to proofread, errors can occur which can change the meaning.  Any change was purely unintentional.

## 2019-06-04 LAB — BASIC METABOLIC PANEL
Anion gap: 18 — ABNORMAL HIGH (ref 5–15)
BUN: 91 mg/dL — ABNORMAL HIGH (ref 8–23)
CO2: 23 mmol/L (ref 22–32)
Calcium: 7.1 mg/dL — ABNORMAL LOW (ref 8.9–10.3)
Chloride: 100 mmol/L (ref 98–111)
Creatinine, Ser: 4.63 mg/dL — ABNORMAL HIGH (ref 0.44–1.00)
GFR calc Af Amer: 11 mL/min — ABNORMAL LOW (ref >60)
GFR calc non Af Amer: 9 mL/min — ABNORMAL LOW (ref >60)
Glucose, Bld: 140 mg/dL — ABNORMAL HIGH (ref 70–99)
Potassium: 3.6 mmol/L (ref 3.5–5.1)
Sodium: 141 mmol/L (ref 135–145)

## 2019-06-04 LAB — GLUCOSE, CAPILLARY
Glucose-Capillary: 120 mg/dL — ABNORMAL HIGH (ref 70–99)
Glucose-Capillary: 126 mg/dL — ABNORMAL HIGH (ref 70–99)
Glucose-Capillary: 132 mg/dL — ABNORMAL HIGH (ref 70–99)
Glucose-Capillary: 152 mg/dL — ABNORMAL HIGH (ref 70–99)

## 2019-06-04 LAB — CBC
HCT: 35.9 % — ABNORMAL LOW (ref 36.0–46.0)
Hemoglobin: 12.1 g/dL (ref 12.0–15.0)
MCH: 28.4 pg (ref 26.0–34.0)
MCHC: 33.7 g/dL (ref 30.0–36.0)
MCV: 84.3 fL (ref 80.0–100.0)
Platelets: 101 10*3/uL — ABNORMAL LOW (ref 150–400)
RBC: 4.26 MIL/uL (ref 3.87–5.11)
RDW: 17 % — ABNORMAL HIGH (ref 11.5–15.5)
WBC: 17.8 10*3/uL — ABNORMAL HIGH (ref 4.0–10.5)
nRBC: 0.6 % — ABNORMAL HIGH (ref 0.0–0.2)

## 2019-06-04 LAB — MAGNESIUM: Magnesium: 1.9 mg/dL (ref 1.7–2.4)

## 2019-06-04 LAB — HEMOGLOBIN AND HEMATOCRIT, BLOOD
HCT: 41.2 % (ref 36.0–46.0)
HCT: 37.6 % (ref 36.0–46.0)
HCT: 37 % (ref 36.0–46.0)
Hemoglobin: 13.7 g/dL (ref 12.0–15.0)
Hemoglobin: 12.7 g/dL (ref 12.0–15.0)
Hemoglobin: 12.1 g/dL (ref 12.0–15.0)

## 2019-06-04 LAB — PHOSPHORUS: Phosphorus: 3.6 mg/dL (ref 2.5–4.6)

## 2019-06-04 MED ORDER — POTASSIUM CHLORIDE 10 MEQ/100ML IV SOLN
10.0000 meq | Freq: Once | INTRAVENOUS | Status: AC
  Administered 2019-06-04: 10 meq via INTRAVENOUS
  Filled 2019-06-04: qty 100

## 2019-06-04 NOTE — Progress Notes (Signed)
Pre HD Tx Assessment   06/04/19 1255  Neurological  Level of Consciousness Alert  Orientation Level Intubated/Tracheostomy - Unable to assess  Respiratory  Respiratory Pattern Regular;Unlabored  Chest Assessment Chest expansion symmetrical  Bilateral Breath Sounds Diminished  Cardiac  Pulse Irregular  Jugular Venous Distention (JVD) No  Cardiac Rhythm SB  Ectopy Unifocal PVC's  Ectopy Frequency Frequent  Antiarrhythmic device No  Vascular  R Radial Pulse +2  L Radial Pulse +2  R Dorsalis Pedis Pulse +2  L Dorsalis Pedis Pulse +2  Generalized Edema +1  Integumentary  Integumentary (WDL) X  Skin Color Appropriate for ethnicity  Skin Condition Dry  Skin Integrity Intact;Ecchymosis  Ecchymosis Location Abdomen;Arm;Leg  Ecchymosis Location Orientation Circumferential  Musculoskeletal  Musculoskeletal (WDL) X  Generalized Weakness Yes  Gastrointestinal  Bowel Sounds Assessment Active  GU Assessment  Genitourinary (WDL) X  Genitourinary Symptoms Urinary Catheter  Psychosocial  Psychosocial (WDL) X  Patient Behaviors Calm (anxious regarding ETT)  Emotional support given Given to patient;Given to patient's family

## 2019-06-04 NOTE — Progress Notes (Signed)
Pre HD Tx Note Pt is in [place] currently intubated on continuous Potassium Infusion 87meq. Pt continuous to be lethargic responds to voice. Femoral Hemodialysis catheter CVC found to be clamped but NOT closed with caps on.    06/04/19 1300  Hand-Off documentation  Report given to (Full Name) Newt Minion RN   Report received from (Full Name) Alphonse Guild RN   Vital Signs  Temp 99.8 F (37.7 C)  Temp Source Axillary  Pulse Rate (!) 56  Resp (!) 25  BP 98/63  BP Location Right Arm  BP Method Automatic  Patient Position (if appropriate) Lying  Oxygen Therapy  SpO2 96 %  O2 Device Ventilator  FiO2 (%) 35 %  Patient Activity (if Appropriate) In bed  Pulse Oximetry Type Continuous  End Tidal CO2 (EtCO2) 25  Pain Assessment  Pain Scale Faces  Faces Pain Scale 0  Dialysis Weight  Weight 72 kg  Type of Weight Pre-Dialysis  Time-Out for Hemodialysis  What Procedure? HD  Pt Identifiers(min of two) MRN/Account#;First/Last Name  Correct Site? Yes  Correct Side? Yes  Correct Procedure? Yes  Safety Precautions Reviewed? Yes  Engineer, civil (consulting) Number 3  Station Number  ([id number])  UF/Alarm Test Passed  Conductivity: Meter 13.6  Conductivity: Machine  13.8  pH 7.4  Reverse Osmosis  (Portable RO 3)  Normal Saline Lot Number [id number]  Dialyzer Lot Number [id number]  Disposable Set Lot Number [id number]  Machine Temperature 98.4 F (36.9 C)  Musician and Audible Yes  Blood Lines Intact and Secured Yes  Pre Treatment Patient Checks  Vascular access used during treatment Catheter  HD catheter dressing before treatment WDL  Patient is receiving dialysis in a chair  (IN BED)  Hepatitis B Surface Antigen Results Negative  Date Hepatitis B Surface Antigen Drawn 05/29/2019  Hepatitis B Surface Antibody  (<10)  Date Hepatitis B Surface Antibody Drawn 06/18/2019  Hemodialysis Consent Verified Yes  Hemodialysis Standing Orders Initiated Yes  ECG (Telemetry) Monitor On Yes  Prime  Ordered Normal Saline  Length of  DialysisTreatment -hour(s) 2 Hour(s)  Dialysis Treatment Comments  (Na140)  Dialyzer Elisio 17H NR  Dialysate 3K;2.5 Ca  Dialysis Anticoagulant None  Dialysate Flow Ordered 500  Blood Flow Rate Ordered 250 mL/min  Ultrafiltration Goal 1 Liters  Dialysis Blood Pressure Support Ordered Albumin  Education / Care Plan  Dialysis Education Provided Yes  Documented Education in Care Plan Yes  Hemodialysis Catheter Left Femoral vein Double lumen Temporary (Non-Tunneled)  Placement Date: 06/03/19   Orientation: Left  Access Location: Femoral vein  Hemodialysis Catheter Type: Double lumen Temporary (Non-Tunneled)  Site Condition No complications  Blue Lumen Status Infusing;Flushed;Blood return noted  Red Lumen Status Infusing;Flushed;Blood return noted  Purple Lumen Status N/A  Catheter fill solution Heparin 1000 units/ml  Catheter fill volume (Arterial) 1.3 cc  Catheter fill volume (Venous) 1.3  Dressing Type Gauze/Drain sponge  Dressing Status Clean;Dry;Intact  Drainage Description None  Dressing Change Due 06/04/19

## 2019-06-04 NOTE — Progress Notes (Signed)
HD Tx Started    06/04/19 1315  Vital Signs  Pulse Rate (!) 54  Resp (!) 28  BP 106/61  Oxygen Therapy  SpO2 97 %  O2 Device Ventilator  FiO2 (%) 35 %  End Tidal CO2 (EtCO2) 25  Pain Assessment  Pain Scale Faces  Pain Score 0  During Hemodialysis Assessment  Blood Flow Rate (mL/min) 250 mL/min  Arterial Pressure (mmHg) -150 mmHg  Venous Pressure (mmHg) 120 mmHg  Transmembrane Pressure (mmHg) 60 mmHg  Ultrafiltration Rate (mL/min) 750 mL/min  Dialysate Flow Rate (mL/min) 500 ml/min  Conductivity: Machine  13.9  HD Safety Checks Performed Yes  Dialysis Fluid Bolus Normal Saline  Bolus Amount (mL) 250 mL  Intra-Hemodialysis Comments Tx initiated

## 2019-06-04 NOTE — Progress Notes (Signed)
Post HD Tx Assessment   06/04/19 1524  Neurological  Level of Consciousness Responds to Voice  Orientation Level Intubated/Tracheostomy - Unable to assess  Respiratory  Respiratory Pattern Regular;Unlabored  Chest Assessment Chest expansion symmetrical  Bilateral Breath Sounds Diminished  Cardiac  Pulse Irregular  Jugular Venous Distention (JVD) No  Cardiac Rhythm SB  Ectopy Unifocal PVC's  Ectopy Frequency Frequent  Antiarrhythmic device No  Vascular  R Radial Pulse +2  L Radial Pulse +2  R Dorsalis Pedis Pulse +2  L Dorsalis Pedis Pulse +2  Generalized Edema +1  Integumentary  Integumentary (WDL) X  Skin Color Appropriate for ethnicity  Skin Condition Dry  Skin Integrity Intact;Ecchymosis  Ecchymosis Location Abdomen;Arm;Leg  Ecchymosis Location Orientation Circumferential  Musculoskeletal  Musculoskeletal (WDL) X  Generalized Weakness Yes  Gastrointestinal  Bowel Sounds Assessment Active  GU Assessment  Genitourinary (WDL) X  Genitourinary Symptoms Urinary Catheter  Psychosocial  Psychosocial (WDL) X  Patient Behaviors Calm (anxious regarding ETT)  Emotional support given Given to patient;Given to patient's family

## 2019-06-04 NOTE — Plan of Care (Signed)
  Problem: Clinical Measurements: Goal: Ability to maintain clinical measurements within normal limits will improve Outcome: Progressing Goal: Will remain free from infection Outcome: Progressing Goal: Diagnostic test results will improve Outcome: Progressing Goal: Respiratory complications will improve Outcome: Progressing Goal: Cardiovascular complication will be avoided Outcome: Progressing  Pt tolerated well HD Tx. UF Goal met 1361m vs prescribed of 15063m CVC dressing changed. Pt continues to be lethargic but responds to voice. BP post tx WDL

## 2019-06-04 NOTE — Progress Notes (Signed)
Follow up - Critical Care Medicine Note  Patient Details:    [name] is an 63 y.o. female with PMH of ETOH abuse and Parkinson's presented 05/28/2019 with Altered Mental Status.  Found to have AKI, Hypoglycemia, and Leukocytosis.  She decompensated (hypotensive, hypoxic, SOB, worsening mental status) in ED requiring intubation and vasopressors.  Noted to have frank blood in OG tube.  Requiring transcutaneous pacing with plans for transvenous pacer. Concern for Cardiogenic vs. Septic vs. Hemorrhagic shock.  Lines, Airways, Drains: Airway 7.5 mm (Active)  Secured at (cm) 22 cm 06/23/2019 1408  Measured From Lips 05/27/2019 [place] 06/08/2019 1408  Secured By [employer] 05/30/2019 1408  Tube Holder Repositioned Yes 05/28/2019 1408  Cuff Pressure (cm H2O) 24 cm H2O 06/08/2019 1408  Site Condition Dry 06/23/2019 1408     CVC Triple Lumen 06/18/2019 Right Internal jugular (Active)  Indication for Insertion or Continuance of Line Vasoactive infusions 05/28/2019 0800  Site Assessment Clean;Dry;Intact 06/11/2019 0800  Proximal Lumen Status Infusing;Blood return noted 06/13/2019 0800  Medial Lumen Status Infusing;Blood return noted 06/22/2019 0800  Distal Lumen Status Infusing;Blood return noted 05/29/2019 0800  Dressing Type Transparent;Occlusive 05/28/2019 0800  Dressing Status Clean;Dry;Intact 06/17/2019 0800  Dressing Change Due 06/07/19 06/04/2019 0800     Negative Pressure Wound Therapy Hip Left (Active)     NG/OG Tube Orogastric 18 Fr. 50 cm (Active)  Cm Marking at Nare/Corner of Mouth (if applicable) 50 cm 0000000 1200  Site Assessment Dry;Intact 05/24/2019 1200  Ongoing Placement Verification No change in cm markings or external length of tube from initial placement 06/18/2019 0400  Status Suction-low intermittent 06/14/2019 1200  Drainage Appearance Bloody 06/19/2019 1200  Output (mL) 0 mL 06/05/2019 0730     Urethral Catheter Claiborne Billings, RN Double-lumen 16 Fr. (Active)  Indication for  Insertion or Continuance of Catheter Unstable critically ill patients first 24-48 hours (See Criteria) 06/09/2019 0800  Site Assessment Clean;Intact 06/09/2019 0800  Catheter Maintenance Bag below level of bladder;Catheter secured;Drainage bag/tubing not touching floor;Insertion date on drainage bag;No dependent loops;Seal intact 06/08/2019 0800  Collection Container Standard drainage bag 05/28/2019 0800  Securement Method Securing device (Describe) 06/11/2019 0800  Urinary Catheter Interventions (if applicable) Unclamped 0000000 0800  Output (mL) 15 mL 06/05/2019 1714    Anti-infectives:  Anti-infectives (From admission, onward)   Start     Dose/Rate Route Frequency Ordered Stop   06/03/19 1200  piperacillin-tazobactam (ZOSYN) IVPB 3.375 g  Status:  Discontinued     3.375 g 12.5 mL/hr over 240 Minutes Intravenous Every 12 hours 06/03/19 0724 06/03/19 1148   06/03/19 1200  ciprofloxacin (CIPRO) IVPB 400 mg     400 mg 200 mL/hr over 60 Minutes Intravenous Every 24 hours 06/03/19 1149     06/03/19 1200  metroNIDAZOLE (FLAGYL) IVPB 500 mg     500 mg 100 mL/hr over 60 Minutes Intravenous Every 8 hours 06/03/19 1149     06/03/19 0100  piperacillin-tazobactam (ZOSYN) IVPB 3.375 g  Status:  Discontinued     3.375 g 12.5 mL/hr over 240 Minutes Intravenous Every 8 hours 06/02/19 1714 06/03/19 0724   05/30/2019 2130  piperacillin-tazobactam (ZOSYN) IVPB 3.375 g  Status:  Discontinued     3.375 g 12.5 mL/hr over 240 Minutes Intravenous Every 8 hours 06/13/2019 2123 06/02/19 1709      Microbiology: Results for orders placed or performed during the hospital encounter of 05/31/19  SARS CORONAVIRUS 2 (TAT 6-24 HRS) Nasopharyngeal Nasopharyngeal Swab  Status: None   Collection Time: 06/21/2019  2:54 PM   Specimen: Nasopharyngeal Swab  Result Value Ref Range Status   SARS Coronavirus 2 NEGATIVE NEGATIVE Final    Comment: (NOTE) SARS-CoV-2 target nucleic acids are NOT DETECTED. The SARS-CoV-2 RNA is generally  detectable in upper and lower respiratory specimens during the acute phase of infection. Negative results do not preclude SARS-CoV-2 infection, do not rule out co-infections with other pathogens, and should not be used as the sole basis for treatment or other patient management decisions. Negative results must be combined with clinical observations, patient history, and epidemiological information. The expected result is Negative. Fact Sheet for Patients: SugarRoll.be Fact Sheet for Healthcare Providers: https://www.woods-mathews.com/ This test is not yet approved or cleared by the [place] FDA and  has been authorized for detection and/or diagnosis of SARS-CoV-2 by FDA under an Emergency Use Authorization (EUA). This EUA will remain  in effect (meaning this test can be used) for the duration of the COVID-19 declaration under Section 56 4(b)(1) of the Act, 21 U.S.C. section 360bbb-3(b)(1), unless the authorization is terminated or revoked sooner. Performed at [facility], [place] [address] [place], [place] [address]   Urine culture     Status: Abnormal   Collection Time: 06/21/2019  3:05 PM   Specimen: Urine, Random  Result Value Ref Range Status   Specimen Description   Final    URINE, RANDOM Performed at [facility], [address] [place], [place] [address]    Special Requests   Final    NONE Performed at [facility], [address] [place], [place] [address]    Culture >=100,000 COLONIES/mL ESCHERICHIA COLI (A)  Final   Report Status 06/02/2019 FINAL  Final   Organism ID, Bacteria ESCHERICHIA COLI (A)  Final      Susceptibility   Escherichia coli - MIC*    AMPICILLIN >=32 RESISTANT Resistant     CEFAZOLIN <=4 SENSITIVE Sensitive     CEFTRIAXONE <=1 SENSITIVE Sensitive     CIPROFLOXACIN <=0.25 SENSITIVE Sensitive     GENTAMICIN <=1 SENSITIVE Sensitive     IMIPENEM <=0.25 SENSITIVE Sensitive      NITROFURANTOIN <=16 SENSITIVE Sensitive     TRIMETH/SULFA <=20 SENSITIVE Sensitive     AMPICILLIN/SULBACTAM 16 INTERMEDIATE Intermediate     PIP/TAZO <=4 SENSITIVE Sensitive     * >=100,000 COLONIES/mL ESCHERICHIA COLI  CULTURE, BLOOD (ROUTINE X 2) w Reflex to ID Panel     Status: None (Preliminary result)   Collection Time: 05/24/2019  6:18 PM   Specimen: BLOOD  Result Value Ref Range Status   Specimen Description BLOOD BLOOD RIGHT HAND  Final   Special Requests   Final    BOTTLES DRAWN AEROBIC AND ANAEROBIC Blood Culture adequate volume   Culture   Final    NO GROWTH 4 DAYS Performed at [facility], [address] [place],  [address]    Report Status PENDING  Incomplete  Respiratory Panel by RT PCR (Flu A&B, Covid) - Nasopharyngeal Swab     Status: None   Collection Time: 06/21/2019  9:14 PM   Specimen: Nasopharyngeal Swab  Result Value Ref Range Status   SARS Coronavirus 2 by RT PCR NEGATIVE NEGATIVE Final    Comment: (NOTE) SARS-CoV-2 target nucleic acids are NOT DETECTED. The SARS-CoV-2 RNA is generally detectable in upper respiratoy specimens during the acute phase of infection. The lowest concentration of SARS-CoV-2 viral copies this assay can detect is 131 copies/mL.  A negative result does not preclude SARS-Cov-2 infection and should not be used as the sole basis for treatment or other patient management decisions. A negative result may occur with  improper specimen collection/handling, submission of specimen other than nasopharyngeal swab, presence of viral mutation(s) within the areas targeted by this assay, and inadequate number of viral copies (<131 copies/mL). A negative result must be combined with clinical observations, patient history, and epidemiological information. The expected result is Negative. Fact Sheet for Patients:  PinkCheek.be Fact Sheet for Healthcare Providers:   GravelBags.it This test is not yet ap proved or cleared by the [place] FDA and  has been authorized for detection and/or diagnosis of SARS-CoV-2 by FDA under an Emergency Use Authorization (EUA). This EUA will remain  in effect (meaning this test can be used) for the duration of the COVID-19 declaration under Section 564(b)(1) of the Act, 21 U.S.C. section 360bbb-3(b)(1), unless the authorization is terminated or revoked sooner.    Influenza A by PCR NEGATIVE NEGATIVE Final   Influenza B by PCR NEGATIVE NEGATIVE Final    Comment: (NOTE) The Xpert Xpress SARS-CoV-2/FLU/RSV assay is intended as an aid in  the diagnosis of influenza from Nasopharyngeal swab specimens and  should not be used as a sole basis for treatment. Nasal washings and  aspirates are unacceptable for Xpert Xpress SARS-CoV-2/FLU/RSV  testing. Fact Sheet for Patients: PinkCheek.be Fact Sheet for Healthcare Providers: GravelBags.it This test is not yet approved or cleared by the [place] FDA and  has been authorized for detection and/or diagnosis of SARS-CoV-2 by  FDA under an Emergency Use Authorization (EUA). This EUA will remain  in effect (meaning this test can be used) for the duration of the  Covid-19 declaration under Section 564(b)(1) of the Act, 21  U.S.C. section 360bbb-3(b)(1), unless the authorization is  terminated or revoked. Performed at [name], [address] [place], [place] [address]   MRSA PCR Screening     Status: None   Collection Time: 06/07/2019 10:35 PM   Specimen: Nasopharyngeal  Result Value Ref Range Status   MRSA by PCR NEGATIVE NEGATIVE Final    Comment:        The GeneXpert MRSA Assay (FDA approved for NASAL specimens only), is one component of a comprehensive MRSA colonization surveillance program. It is not intended to diagnose MRSA infection nor to guide or monitor  treatment for MRSA infections. Performed at [facility], [address] [place], [place] [address]   CULTURES: Blood x2 12/8>> Urine 12/8>> E. coli Tracheal aspirate 12/8>> SARS-CoV-2 PCR 12/8>> NEGATIVE Influenza PCR 12/8>>NEGATIVE Respiratory Viral Panel 12/8>> NEGATIVE MRSA PCR 12/8>> NEGATIVE  Best Practice/Protocols:  VTE Prophylaxis: Mechanical GI Prophylaxis: Proton Pump Inhibitor  ANTIBIOTICS: Zosyn 12/8>>    Events: 12/8- Presented to ED 12/8- Decompensated in ED requiring intubation and vasopressors 12/8- right IJ CVC 12/9- Transvenous pacer placed, right femoral approach 12/11 - patient is able to follow some commands, transvenous pacer removed by cardio.  CVP this am 13.  No bloody output per OGT.   12/12 - s/p HD catheter placement, had SBT this am was unable to get adequate Vt on 10/5 PS, will attempt again after IHD  Studies: EEG  Result Date: 05/28/2019 Lora Havens, MD     06/03/2019  5:22 PM Patient Name: RANDALYN ELLERS MRN: [id number] Epilepsy Attending: Lora Havens Referring Physician/Provider: Darel Hong, NP Date: 06/18/2019 Duration: 24.33mins Patient history: 63yo F with ams. EEG to evaluate for seizure  Level of alertness: comatose AEDs during EEG study: Librium, lorazepam Technical aspects: This EEG study was done with scalp electrodes positioned according to the 10-20 International system of electrode placement. Electrical activity was acquired at a sampling rate of 500Hz  and reviewed with a high frequency filter of 70Hz  and a low frequency filter of 1Hz . EEG data were recorded continuously and digitally stored. DESCRIPTION: EEG showed continuous generalized 3-5hz  theta-delta slowing admixed with an excessive amount of 15 to 18 Hz, 2-3 uV beta activity with irregular morphology distributed symmetrically and diffusely. Hyperventilation and photic stimulation were not performed. Episode of left shoulder and feet and head twitching was  recorded per eeg tech annotation ( difficult to visualize on video). Concomitant eeg before, during and after the event didn't show any eeg change to suggest seizure. ABNORMALITY - Continuous slow, generalized - Excessive beta, generalized IMPRESSION: This study is suggestive of severe diffuse encephalopathy, non specific to etiology.  The excessive beta activity seen in the background is most likely due to the effect of benzodiazepine and is a benign EEG pattern. One episode of left shoulder, feet and head twitching was recorded without EEG change and was likely non epileptic. No definite seizures or epileptiform discharges were seen throughout the recording. Clinical correlation is recommended Priyanka Barbra Sarks   CT ABDOMEN PELVIS WO CONTRAST  Result Date: 05/28/2019 CLINICAL DATA:  Abdominal rigidity EXAM: CT ABDOMEN AND PELVIS WITHOUT CONTRAST TECHNIQUE: Multidetector CT imaging of the abdomen and pelvis was performed following the standard protocol without IV contrast. COMPARISON:  Ultrasound from 04/01/2019 FINDINGS: Lower chest: Small right pleural effusion is noted with bibasilar atelectasis right greater than left. No focal parenchymal nodules are seen. Gastric catheter is noted extending into the stomach. Hepatobiliary: Liver is well visualized and within normal limits. Gallbladder demonstrates some mild wall thickening likely reactive related to the ascites. Increased density is noted within consistent with cholelithiasis and gallbladder sludge similar to that noted on prior ultrasound. Pancreas: Unremarkable. No pancreatic ductal dilatation or surrounding inflammatory changes. Spleen: Normal in size without focal abnormality. Adrenals/Urinary Tract: Adrenal glands are within normal limits. Kidneys are well visualized bilaterally. No renal calculi or obstructive changes are seen. The bladder is decompressed by Foley catheter and obscured by bilateral hip replacements. Stomach/Bowel: Scattered  diverticular change of the colon is noted without evidence of diverticulitis. No obstructive or inflammatory changes are seen. The appendix is within normal limits. Some inflammatory changes are noted surrounding the second portion of the duodenum and head of the pancreas as well as extending into the mesentery inferiorly. This may be related to some focal duodenitis although may be reactive related to the mild ascites. Vascular/Lymphatic: Aortic atherosclerosis. No enlarged abdominal or pelvic lymph nodes. Reproductive: Status post hysterectomy. No adnexal masses. Other: No abdominal wall hernia or abnormality. No abdominopelvic ascites. Musculoskeletal: Bilateral hip replacements are noted. Degenerative changes of lumbar spine are seen. IMPRESSION: Small right pleural effusion with bibasilar atelectasis. Cholelithiasis and gallbladder sludge. Mild ascites. Inflammatory changes surrounding duodenum and head of the pancreas likely related to some duodenitis. No perforation or definitive ulcer crater is seen. These changes may be in part related to the underlying ascites as well. No other focal abnormality is noted. Electronically Signed   By: Inez Catalina M.D.   On: 05/25/2019 22:28   CT HEAD WO CONTRAST  Result Date: 06/17/2019 CLINICAL DATA:  Encephalopathy EXAM: CT HEAD WITHOUT CONTRAST TECHNIQUE: Contiguous axial images were obtained from the base of the skull through the vertex without  intravenous contrast. COMPARISON:  06/09/2019 FINDINGS: Brain: There is no mass, hemorrhage or extra-axial collection. The size and configuration of the ventricles and extra-axial CSF spaces are normal. The brain parenchyma is normal, without acute or chronic infarction. Vascular: No abnormal hyperdensity of the major intracranial arteries or dural venous sinuses. No intracranial atherosclerosis. Skull: The visualized skull base, calvarium and extracranial soft tissues are normal. Sinuses/Orbits: No fluid levels or advanced  mucosal thickening of the visualized paranasal sinuses. No mastoid or middle ear effusion. The orbits are normal. IMPRESSION: Normal head CT. Electronically Signed   By: Ulyses Jarred M.D.   On: 06/05/2019 02:53   CT Head Wo Contrast  Result Date: 06/15/2019 CLINICAL DATA:  Altered level of consciousness. Confusion. Parkinson's. EXAM: CT HEAD WITHOUT CONTRAST TECHNIQUE: Contiguous axial images were obtained from the base of the skull through the vertex without intravenous contrast. COMPARISON:  April 13, 2019 FINDINGS: Brain: No subdural, epidural, or subarachnoid hemorrhage identified. Cerebellum, brainstem, and basal cisterns are normal. No mass effect or midline shift. No acute cortical ischemia or infarct identified. Vascular: No hyperdense vessel or unexpected calcification. Skull: Normal. Negative for fracture or focal lesion. Sinuses/Orbits: No acute finding. Other: None. IMPRESSION: 1. No acute intracranial abnormalities are identified. 2. Electronically Signed   By: Dorise Bullion III M.D   On: 06/22/2019 14:29   CARDIAC CATHETERIZATION  Result Date: 06/17/2019 Successful transvenous temporary pacemaker  US RENAL  Result Date: 06/05/2019 CLINICAL DATA:  AKI EXAM: RENAL / URINARY TRACT ULTRASOUND COMPLETE COMPARISON:  None. FINDINGS: Right Kidney: Renal measurements: 10.4 x 4.5 x 5.6 cm = volume: 136 mL . Echogenicity is increased. No mass or hydronephrosis visualized. Left Kidney: Renal measurements: 10.2 x 5.3 x 4.3 cm = volume: 123 mL. Echogenicity is increased. No mass or hydronephrosis visualized. Bladder: Decompressed by Foley catheter. Other: Cholelithiasis, sludge, mild gallbladder wall thickening. Mild perinephric fluid. Free fluid is present in the pelvis. IMPRESSION: No hydronephrosis. Increased renal echogenicity reflecting medical renal disease. Mild free fluid. Gallbladder stones, sludge, and wall thickening. Electronically Signed   By: Macy Mis M.D.   On: 06/19/2019 11:35    DG Chest Port 1 View  Result Date: 06/02/2019 CLINICAL DATA:  Acute respiratory failure. EXAM: PORTABLE CHEST 1 VIEW COMPARISON:  Radiograph 05/25/2019 FINDINGS: Endotracheal tube tip 17 mm from the carina. Enteric tube tip and side-port below the diaphragm in the stomach. Right internal jugular central venous catheter tip in the upper SVC. Pacemaker from an inferior approach projects over the expected right ventricle. Increasing hazy opacity at the lung bases consistent with pleural effusions and atelectasis. Vascular congestion. No pneumothorax. IMPRESSION: 1. Worsening bilateral pleural effusions, right greater than left. 2. Vascular congestion. 3. Temporary pacemaker placement. Support apparatus otherwise unchanged. Electronically Signed   By: Keith Rake M.D.   On: 06/02/2019 05:08   DG Chest Port 1 View  Result Date: 05/28/2019 CLINICAL DATA:  Central line placement. EXAM: PORTABLE CHEST 1 VIEW COMPARISON:  Radiograph earlier this day. FINDINGS: Tip of the right internal jugular central venous catheter projects over the upper SVC. No visualized pneumothorax. Endotracheal tube and enteric tube remain in place. Borderline cardiomegaly. Unchanged vascular congestion. No new airspace disease. Streaky bibasilar atelectasis. IMPRESSION: 1. Tip of the right internal jugular central venous catheter projects over the upper SVC. No pneumothorax. 2. Unchanged vascular congestion. 3. Streaky bibasilar atelectasis. Electronically Signed   By: Keith Rake M.D.   On: 05/28/2019 00:19   DG Chest Portable 1 View  Result Date:  05/24/2019 CLINICAL DATA:  Respiratory distress. Endotracheal tube and OG tube placement. EXAM: PORTABLE CHEST 1 VIEW COMPARISON:  06/10/2019 FINDINGS: Endotracheal tube is noted with tip 2.8 cm above the carina. An OG tube is noted entering the stomach with tip just off of the field of view. This is a mildly low volume film. Cardiomegaly and mild pulmonary vascular congestion  noted. No pneumothorax or pleural effusion. Defibrillator/pacing pads overlying the chest noted. No acute bony abnormalities are identified. IMPRESSION: 1. Endotracheal tube with tip 2.8 cm above the carina. 2. OG tube entering the stomach with tip just off of the field of view. 3. Cardiomegaly with mild pulmonary vascular congestion. Electronically Signed   By: Margarette Canada M.D.   On: 06/03/2019 20:51   DG Chest Portable 1 View  Result Date: 05/29/2019 CLINICAL DATA:  Altered mental status. EXAM: PORTABLE CHEST 1 VIEW COMPARISON:  April 17, 2019. FINDINGS: The heart size and mediastinal contours are within normal limits. Both lungs are clear. The visualized skeletal structures are unremarkable. IMPRESSION: No active disease. Electronically Signed   By: Marijo Conception M.D.   On: 06/02/2019 14:02   DG Abd Portable 1 View  Result Date: 06/02/2019 CLINICAL DATA:  OG tube placement. EXAM: PORTABLE ABDOMEN - 1 VIEW COMPARISON:  None. FINDINGS: An OG tube is noted with tip overlying the mid stomach. Bowel gas pattern is unremarkable. IMPRESSION: 1. OG tube with tip overlying the mid stomach. 2. Unremarkable bowel gas pattern. Electronically Signed   By: Margarette Canada M.D.   On: 05/30/2019 20:52   ECHOCARDIOGRAM COMPLETE  Result Date: 05/31/2019   ECHOCARDIOGRAM REPORT   Patient Name:   [name] Date of Exam: 06/17/2019 Medical Rec #:  [id number]     Height:       65.0 in Accession #:    [id number]    Weight:       135.4 lb Date of Birth:  01/17/1956     BSA:          1.68 m Patient Age:    89 years      BP:           133/70 mmHg Patient Gender: F             HR:           70 bpm. Exam Location:  [facility] Procedure: 2D Echo, Color Doppler and Cardiac Doppler Indications:     R55 Syncope  History:         Patient has no prior history of Echocardiogram examinations.                  ETOH abuse.  Sonographer:     Charmayne Sheer RDCS (AE) Referring Phys:  [id number] TAWFIKUL ALAM Diagnosing Phys: Isaias Cowman MD   Sonographer Comments: Echo performed with patient supine and on artificial respirator. IMPRESSIONS  1. Left ventricular ejection fraction, by visual estimation, is 40 to 45%. The left ventricle has normal function. There is no left ventricular hypertrophy.  2. Mildly dilated left ventricular internal cavity size.  3. The left ventricle has no regional wall motion abnormalities.  4. Global right ventricle has normal systolic function.The right ventricular size is normal. No increase in right ventricular wall thickness.  5. Left atrial size was mild-moderately dilated.  6. Right atrial size was normal.  7. The mitral valve is normal in structure. Moderate mitral valve regurgitation. No evidence of mitral stenosis.  8. The tricuspid valve is normal in  structure. Tricuspid valve regurgitation is not demonstrated.  9. The aortic valve is normal in structure. Aortic valve regurgitation is not visualized. No evidence of aortic valve sclerosis or stenosis. 10. The pulmonic valve was normal in structure. Pulmonic valve regurgitation is not visualized. 11. Moderately elevated pulmonary artery systolic pressure. 12. The inferior vena cava is normal in size with greater than 50% respiratory variability, suggesting right atrial pressure of 3 mmHg. FINDINGS  Left Ventricle: Left ventricular ejection fraction, by visual estimation, is 40 to 45%. The left ventricle has normal function. The left ventricle has no regional wall motion abnormalities. The left ventricular internal cavity size was mildly dilated left ventricle. There is no left ventricular hypertrophy. Normal left atrial pressure. Right Ventricle: The right ventricular size is normal. No increase in right ventricular wall thickness. Global RV systolic function is has normal systolic function. The tricuspid regurgitant velocity is 2.96 m/s, and with an assumed right atrial pressure  of 10 mmHg, the estimated right ventricular systolic pressure is moderately elevated at  45.2 mmHg. Left Atrium: Left atrial size was mild-moderately dilated. Right Atrium: Right atrial size was normal in size Pericardium: There is no evidence of pericardial effusion. Mitral Valve: The mitral valve is normal in structure. Moderate mitral valve regurgitation. No evidence of mitral valve stenosis by observation. MV peak gradient, 6.2 mmHg. Tricuspid Valve: The tricuspid valve is normal in structure. Tricuspid valve regurgitation is not demonstrated. Aortic Valve: The aortic valve is normal in structure. Aortic valve regurgitation is not visualized. The aortic valve is structurally normal, with no evidence of sclerosis or stenosis. Aortic valve mean gradient measures 2.0 mmHg. Aortic valve peak gradient measures 3.7 mmHg. Aortic valve area, by VTI measures 1.32 cm. Pulmonic Valve: The pulmonic valve was normal in structure. Pulmonic valve regurgitation is not visualized. Pulmonic regurgitation is not visualized. Aorta: The aortic root, ascending aorta and aortic arch are all structurally normal, with no evidence of dilitation or obstruction. Venous: The inferior vena cava is normal in size with greater than 50% respiratory variability, suggesting right atrial pressure of 3 mmHg. IAS/Shunts: No atrial level shunt detected by color flow Doppler. There is no evidence of a patent foramen ovale. No ventricular septal defect is seen or detected. There is no evidence of an atrial septal defect.  LEFT VENTRICLE PLAX 2D LVIDd:         4.94 cm LVIDs:         4.14 cm LV PW:         0.84 cm LV IVS:        0.65 cm LVOT diam:     1.80 cm LV SV:         39 ml LV SV Index:   23.20 LVOT Area:     2.54 cm  LV Volumes (MOD) LV area d, A2C:    20.50 cm LV area d, A4C:    20.50 cm LV area s, A2C:    14.00 cm LV area s, A4C:    15.60 cm LV major d, A2C:   6.47 cm LV major d, A4C:   5.89 cm LV major s, A2C:   5.74 cm LV major s, A4C:   5.62 cm LV vol d, MOD A2C: 53.7 ml LV vol d, MOD A4C: 57.7 ml LV vol s, MOD A2C: 28.2 ml  LV vol s, MOD A4C: 36.1 ml LV SV MOD A2C:     25.5 ml LV SV MOD A4C:     57.7 ml LV SV  MOD BP:      26.1 ml RIGHT VENTRICLE RV Basal diam:  3.13 cm LEFT ATRIUM             Index       RIGHT ATRIUM           Index LA diam:        4.20 cm 2.51 cm/m  RA Area:     17.40 cm LA Vol (A2C):   80.6 ml 48.10 ml/m RA Volume:   48.40 ml  28.88 ml/m LA Vol (A4C):   78.9 ml 47.08 ml/m LA Biplane Vol: 82.7 ml 49.35 ml/m  AORTIC VALVE                   PULMONIC VALVE AV Area (Vmax):    1.51 cm    PV Vmax:       0.75 m/s AV Area (Vmean):   1.39 cm    PV Vmean:      48.300 cm/s AV Area (VTI):     1.32 cm    PV VTI:        0.117 m AV Vmax:           95.70 cm/s  PV Peak grad:  2.3 mmHg AV Vmean:          62.900 cm/s PV Mean grad:  1.0 mmHg AV VTI:            0.162 m AV Peak Grad:      3.7 mmHg AV Mean Grad:      2.0 mmHg LVOT Vmax:         56.90 cm/s LVOT Vmean:        34.400 cm/s LVOT VTI:          0.084 m LVOT/AV VTI ratio: 0.52  AORTA Ao Root diam: 2.80 cm MITRAL VALVE                         TRICUSPID VALVE MV Area (PHT): 4.89 cm              TR Peak grad:   35.2 mmHg MV Peak grad:  6.2 mmHg              TR Vmax:        304.00 cm/s MV Mean grad:  2.0 mmHg MV Vmax:       1.24 m/s              SHUNTS MV Vmean:      65.6 cm/s             Systemic VTI:  0.08 m MV VTI:        0.29 m                Systemic Diam: 1.80 cm MV PHT:        44.95 msec MV Decel Time: 155 msec MV E velocity: 107.00 cm/s 103 cm/s MV A velocity: 39.80 cm/s  70.3 cm/s MV E/A ratio:  2.69        1.5  Isaias Cowman MD Electronically signed by Isaias Cowman MD Signature Date/Time: 05/28/2019/1:40:24 PM    Final     Consults: Treatment Team:  Isaias Cowman, MD Tyler Pita, MD Leotis Pain, MD Murlean Iba, MD Efrain Sella, MD   Subjective:    Overnight Issues: Intubated and mechanically ventilated.  Remains encephalopathic  Objective:  Vital signs for last 24 hours: Temp:  [98.1 F (36.7  C)-100.2 F (37.9  C)] 99.7 F (37.6 C) (12/12 1108) Pulse Rate:  [50-63] 57 (12/12 0800) Resp:  [12-34] 29 (12/12 1000) BP: (98-181)/(52-156) 138/108 (12/12 1000) SpO2:  [92 %-100 %] 93 % (12/12 1108) FiO2 (%):  [35 %] 35 % (12/12 1108) Weight:  [71.7 kg] 71.7 kg (12/12 0500)  Hemodynamic parameters for last 24 hours: CVP:  [11 mmHg-18 mmHg] 15 mmHg  Intake/Output from previous day: 12/11 0701 - 12/12 0700 In: 1563.9 [I.V.:1119; IV Piggyback:444.9] Out: 290 [Urine:190; Emesis/NG output:100]  Intake/Output this shift: No intake/output data recorded.  Vent settings for last 24 hours: Vent Mode: PSV FiO2 (%):  [35 %] 35 % Set Rate:  [16 bmp] 16 bmp Vt Set:  [400 mL] 400 mL PEEP:  [5 cmH20] 5 cmH20 Pressure Support:  [10 cmH20-14 cmH20] 14 cmH20 Plateau Pressure:  [11 cmH20-19 cmH20] 11 cmH20  Physical Exam:  General: Critically ill appearing female, intubated, synchronous with the vent, Neuro:  Unresponsive on no sedation, pupils fixed and dilated (6 mm bilaterally), no corneal reflex, no cough/gag reflex, opens eyes, some nystagmus noted, no tracking HEENT:  Atraumatic, normocephalic, neck supple, no JVD Cardiovascular:  Irregular rhythm, bradycardia Lungs:  Coarse breath sounds throughout, vent assisted Abdomen:  Soft, nontender, nondistended, no guarding or rebound tenderness, Hypoactive bowel sounds Musculoskeletal:  LUE amputation Skin:  Cool, mottled, no obvious rashes, lesions, or ulcerations  Assessment/Plan:  PULMONARY A: Acute Hypoxic Respiratory Failure, likely secondary to severe metabolic acidosis and pulmonary vascular congestion Hx: Asthma, no exacerbation P:   -Full vent support -Follow intermittent CXR & ABG as needed -Wean FiO2 & PEEP as tolerated -Spontaneous breathing trial- failed today  -PRN Bronchodilators   CARDIOVASCULAR A:  Hypotension, likely Cardiogenic shock vs.Septic shock vs.Hemorrhagic shock Bradycardia-RESOLVED P:  -Continuous cardiac  monitoring -Maintain MAP >65 -IV fluids -Levophed as needed to maintain MAP goal -Trend CVP -Cardiology following, appreciate input -Transvenous pacer has helped hemodynamics -Trend troponin -Check BNP -Echocardiogram performed EF is 40 to 45%, normal RV, moderate MR  RENAL A:   AKI likely ATN Anion gap Metabolic acidosis Rhabdomyolysis Hyperkalemia>>resolved Hyponatremia>>resolved P:   -Monitor I&O's / urinary output -Follow BMP -Ensure adequate renal perfusion -Avoid nephrotoxic agents as able, pharmacy consultation for dosing of medications -Replace electrolytes as indicated -Nephrology consulted,-S/P HD cath, will have IHD today -IV fluids -Follow serum CK -Renal US: No hydronephrosis, medical renal disease -Bicarb gtt, discontinued   GASTROINTESTINAL A:   GI Bleed Cholelithiasis w/ Gallbladder sludge CT Abdomen & Pelvis w/ Duodenitis Elevated LFT's, likely shock liver Hyperammonemia P:   -NPO -Protonix gtt -GI consulted, appreciate input, discussed with Dr. Alice Reichert, check acetaminophen level, empiric NAC -Transfuse as indicated -Consider General Surgery consult -Trend LFT's & Ammonia -Check Hepatitis panel  HEMATOLOGIC A:   Acute blood loss anemia Supratherapeutic INR, likely in setting of liver failure vs. DIC P:  -Monitor for S/Sx of bleeding -Trend CBC -SCD's for VTE Prophylaxis (no chemical prophylaxis given GI bleed) -Transfuse for Hgb <8 -Trend bleeding times -Check DIC panel -Consider FFP & Vitamin K   INFECTIOUS A:   Leukocytosis Meets SIRS Criteria, CT Abdomen with Duodenitis P:   -Monitor fever curve -Trend WBC's & Procalcitonin -Continue empiric Zosyn -Follow pan cultures as above -CXR without infiltrate -CT Abdomen with Cholelithiasis and gallbladder slude, and with Duodenitis (no perforation noted) -will narrow to cipro/flagyl -  Nasal mrsa pcr is negative  ENDOCRINE A:   Hypoglycemia   P:   -CBG's -Follow ICU  Hypo/hyperglycemia protocol -D5  infusion  NEUROLOGIC A:   Altered mental status, now unresponsive without corneal & gag/cough reflexes; concern for anoxic injury Hx: Parkinson's, Bipolar disorder P:   -RASS goal: 0 to -1 -Fentanyl pushes as needed to maintain RASS goal -Daily wake up assessments -CT head negative 12/8 -Will repeat CT Head due to poor Neuro exam upon arrival to ICU>> repeat CT Head normal -Obtained EEG: No seizures, diffuse encephalopathy, nonspecific.  Excessive beta activity consistent with benzodiazepine -Consult Neurology, appreciate input -Urine drug screen positive for benzodiazepines -On standing dose Librium for potential EtOH withdrawal, discontinue    LOS: 4 days   Critical care provider statement:    Critical care time (minutes):  33   Critical care time was exclusive of:  Separately billable procedures and  treating other patients   Critical care was necessary to treat or prevent imminent or  life-threatening deterioration of the following conditions:   Circulatory shock, acute blood loss anemia GI bleed, altered mental status with encephalopathy, coagulopathy, severe AKI, multiple comorbid conditions   Critical care was time spent personally by me on the following  activities:  Development of treatment plan with patient or surrogate,  discussions with consultants, evaluation of patient's response to  treatment, examination of patient, obtaining history from patient or  surrogate, ordering and performing treatments and interventions, ordering  and review of laboratory studies and re-evaluation of patient's condition   I assumed direction of critical care for this patient from another  provider in my specialty: no     Ottie Glazier, M.D.  Pulmonary & Critical Care Medicine  [place] during the described time interval was provided by me and/or other providers on the critical care team.  I have reviewed this patient's  available data, including medical history, events of note, physical examination and test results as part of my evaluation.  **This note was dictated using voice recognition software/Dragon.  Despite best efforts to proofread, errors can occur which can change the meaning.  Any change was purely unintentional.

## 2019-06-04 NOTE — Consult Note (Signed)
PHARMACY CONSULT NOTE - FOLLOW UP  Pharmacy Consult for Electrolyte Monitoring and Replacement   Recent Labs: Potassium (mmol/L)  Date Value  06/04/2019 3.6  10/11/2014 4.1   Magnesium (mg/dL)  Date Value  06/04/2019 1.9   Calcium (mg/dL)  Date Value  06/04/2019 7.1 (L)   Calcium, Total (mg/dL)  Date Value  10/11/2014 9.1   Albumin (g/dL)  Date Value  06/02/2019 2.6 (L)  10/11/2014 4.3   Phosphorus (mg/dL)  Date Value  06/04/2019 3.6   Sodium (mmol/L)  Date Value  06/04/2019 141  10/11/2014 34   63 year old female admitted with GI bleed on protonix gtt with PMH of neuropathy, parkinson's syndrome, UTI, bipolar, cancer, and history of recurrent falls. Patient is intubated, but currently NPO.  Patient's lactulose was restarted.    Electrolytes Goal:  Electrolytes wnl. Continue D5 with KCl 20 mEq @ 50 mL/h Give potassium KCl 10 mEq IV x 1 dose. Pharmacy will monitor with AM labs    Tawnya Crook, PharmD 06/04/2019 7:51 AM

## 2019-06-04 NOTE — Plan of Care (Signed)
Patient remains intubated.  Alert and responsive to questions.  Patient attempting to communicate by mouthing words.  No acute concerns at this time.  No excess bleeding at femoral sites.  Will continue to monitor.

## 2019-06-04 NOTE — Procedures (Signed)
Central Venous Catheter Placement: DUAL LUMEN DIALYSIS CATHETER Indication: Patient receiving vesicant or irritant drug.; Patient receiving intravenous therapy for longer than 5 days.; Patient has limited or no vascular access.   Consent:DAUGHTER SINGED CONSENT ON PAPER CHART    Hand washing performed prior to starting the procedure.   Procedure:   An active timeout was performed and correct patient, name, & ID confirmed.   Patient was positioned correctly for central venous access.  Patient was prepped using strict sterile technique including chlorohexadine preps, sterile drape, sterile gown and sterile gloves.    The area was prepped, draped and anesthetized in the usual sterile manner. Patient comfort was obtained.    A triple lumen catheter was placed in Left femoral Vein There was good blood return, catheter caps were placed on lumens, catheter flushed easily, the line was secured and a sterile dressing and BIO-PATCH applied.   Ultrasound was used to visualize vasculature and guidance of needle.   Number of Attempts: 1 Complications:none Estimated Blood Loss: none Chest Radiograph indicated and ordered.    Ottie Glazier, M.D.  Pulmonary & [place]

## 2019-06-04 NOTE — Progress Notes (Signed)
HD TX Ended   06/04/19 1510  Vital Signs  Pulse Rate 61  Resp 14  BP (!) 111/100  Oxygen Therapy  SpO2 96 %  O2 Device Ventilator  FiO2 (%) 35 %  End Tidal CO2 (EtCO2) 29  Pain Assessment  Pain Scale Faces  Pain Score 0  During Hemodialysis Assessment  Blood Flow Rate (mL/min) 250 mL/min  Arterial Pressure (mmHg) -200 mmHg  Venous Pressure (mmHg) 120 mmHg  Transmembrane Pressure (mmHg) 60 mmHg  Ultrafiltration Rate (mL/min) 750 mL/min  Dialysate Flow Rate (mL/min) 500 ml/min  Conductivity: Machine  13.5  HD Safety Checks Performed Yes  Intra-Hemodialysis Comments Tx completed

## 2019-06-04 NOTE — Progress Notes (Signed)
Post HD Tx Note    06/04/19 1515  Hand-Off documentation  Report given to (Full Name) Danelle Earthly RN  Report received from (Full Name) Newt Minion RN   Vital Signs  Temp 98.7 F (37.1 C)  Temp Source Axillary  Pulse Rate (!) 59  Resp (!) 21  BP (!) 110/95  BP Location Right Arm  BP Method Automatic  Patient Position (if appropriate) Lying  Oxygen Therapy  SpO2 97 %  O2 Device Ventilator  FiO2 (%) 35 %  End Tidal CO2 (EtCO2) 27  Pain Assessment  Pain Scale Faces  Pain Score 0  Post-Hemodialysis Assessment  Rinseback Volume (mL) 250 mL  KECN 25.8 V  Dialyzer Clearance Lightly streaked  Duration of HD Treatment -hour(s) 2 hour(s)  Hemodialysis Intake (mL) 500 mL  UF Total -Machine (mL) 1354 mL  Net UF (mL) 854 mL  Tolerated HD Treatment Yes  Hemodialysis Catheter Left Femoral vein Double lumen Temporary (Non-Tunneled)  Placement Date: 06/03/19   Orientation: Left  Access Location: Femoral vein  Hemodialysis Catheter Type: Double lumen Temporary (Non-Tunneled)  Site Condition No complications  Blue Lumen Status Heparin locked  Red Lumen Status Heparin locked  Purple Lumen Status N/A  Catheter fill solution Heparin 1000 units/ml  Catheter fill volume (Arterial) 1.3 cc  Catheter fill volume (Venous) 1.3  Dressing Type Biopatch;Occlusive  Dressing Status Clean;Dry;Intact  Interventions New dressing  Drainage Description None  Dressing Change Due 06/08/19  Post treatment catheter status Capped and Clamped

## 2019-06-04 NOTE — Progress Notes (Signed)
[facility], [place] 06/04/19  Subjective:   Hospital day # 4  Neuro: sedation off, interactive Cvs: off pressors Pulm: Fio2 30%, vent assisted Gi:OGT  Renal: 12/11 0701 - 12/12 0700 In: 1563.9 [I.V.:1119; IV Piggyback:444.9] Out: 290 [Urine:190; Emesis/NG output:100] Lab Results  Component Value Date   CREATININE 4.63 (H) 06/04/2019   CREATININE 4.10 (H) 06/03/2019   CREATININE 2.72 (H) 06/02/2019     Objective:  Vital signs in last 24 hours:  Temp:  [98.1 F (36.7 C)-100.2 F (37.9 C)] 98.1 F (36.7 C) (12/12 0000) Pulse Rate:  [50-72] 51 (12/12 0000) Resp:  [6-34] 19 (12/12 0000) BP: (98-181)/(52-156) 106/63 (12/12 0000) SpO2:  [92 %-100 %] 96 % (12/12 0802) FiO2 (%):  [35 %] 35 % (12/12 0753) Weight:  [71.7 kg] 71.7 kg (12/12 0500)  Weight change: 5.1 kg Filed Weights   06/02/19 0422 06/03/19 0444 06/04/19 0500  Weight: 62.8 kg 66.6 kg 71.7 kg    Intake/Output:    Intake/Output Summary (Last 24 hours) at 06/04/2019 0850 Last data filed at 06/04/2019 0400 Gross per 24 hour  Intake 1263.87 ml  Output 290 ml  Net 973.87 ml     Physical Exam: General: Critically ill appearing  HEENT ETT, OGT  Pulm/lungs Vent assisted,   CVS/Heart regular , in 70's  Abdomen:  Soft, non distended  Extremities: trace edema  Neurologic:  Able to follow simple commands  Skin: warm   foley       Basic Metabolic Panel:  Recent Labs  Lab 05/24/2019 0001 05/27/2019 0429 06/02/19 0445 06/03/19 0538 06/03/19 1133 06/04/19 0515  NA 137 139 147* 145  --  141  K 4.8 4.0 3.6 3.5  --  3.6  CL 102 104 102 101  --  100  CO2 15* 18* 24 22  --  23  GLUCOSE 224* 291* 116* 136*  --  140*  BUN 62* 60* 71* 86*  --  91*  CREATININE 2.02* 1.90* 2.72* 4.10*  --  4.63*  CALCIUM 7.4* 6.8* 6.6* 6.7*  --  7.1*  MG  --   --   --  1.9  --  1.9  PHOS  --   --   --   --  4.0 3.6     CBC: Recent Labs  Lab 06/14/2019 1228 06/23/2019 2049 06/21/2019 0429  06/02/19 0445 06/02/19 1634 06/03/19 0538 06/03/19 1133 06/03/19 2245 06/04/19 0515  WBC 16.0* 23.1* 27.4* 20.8*  --  20.7*  --   --  17.8*  NEUTROABS 12.4* 18.3*  --  16.5*  --   --   --   --   --   HGB 11.6* 11.2* 14.3 14.7 14.5 13.4 13.9 12.4 12.1  HCT 36.4 37.3 45.0 44.8 44.5 40.9 40.8 37.7 35.9*  MCV 91.2 98.9 91.1 87.3  --  87.6  --   --  84.3  PLT 381 244 158 167  --  147*  --   --  101*      Lab Results  Component Value Date   HEPBSAG NON REACTIVE 06/05/2019   HEPBIGM NON REACTIVE 06/08/2019      Microbiology:  Recent Results (from the past 240 hour(s))  SARS CORONAVIRUS 2 (TAT 6-24 HRS) Nasopharyngeal Nasopharyngeal Swab     Status: None   Collection Time: 06/13/2019  2:54 PM   Specimen: Nasopharyngeal Swab  Result Value Ref Range Status   SARS Coronavirus 2 NEGATIVE NEGATIVE Final    Comment: (NOTE) SARS-CoV-2 target  nucleic acids are NOT DETECTED. The SARS-CoV-2 RNA is generally detectable in upper and lower respiratory specimens during the acute phase of infection. Negative results do not preclude SARS-CoV-2 infection, do not rule out co-infections with other pathogens, and should not be used as the sole basis for treatment or other patient management decisions. Negative results must be combined with clinical observations, patient history, and epidemiological information. The expected result is Negative. Fact Sheet for Patients: SugarRoll.be Fact Sheet for Healthcare Providers: https://www.woods-mathews.com/ This test is not yet approved or cleared by the [place] FDA and  has been authorized for detection and/or diagnosis of SARS-CoV-2 by FDA under an Emergency Use Authorization (EUA). This EUA will remain  in effect (meaning this test can be used) for the duration of the COVID-19 declaration under Section 56 4(b)(1) of the Act, 21 U.S.C. section 360bbb-3(b)(1), unless the authorization is terminated  or revoked sooner. Performed at [facility], [place] [address] [place], [place] [address]   Urine culture     Status: Abnormal   Collection Time: 06/23/2019  3:05 PM   Specimen: Urine, Random  Result Value Ref Range Status   Specimen Description   Final    URINE, RANDOM Performed at [name], [address] [place], [place] [address]    Special Requests   Final    NONE Performed at [name], [address] [place], [place] [address]    Culture >=100,000 COLONIES/mL ESCHERICHIA COLI (A)  Final   Report Status 06/02/2019 FINAL  Final   Organism ID, Bacteria ESCHERICHIA COLI (A)  Final      Susceptibility   Escherichia coli - MIC*    AMPICILLIN >=32 RESISTANT Resistant     CEFAZOLIN <=4 SENSITIVE Sensitive     CEFTRIAXONE <=1 SENSITIVE Sensitive     CIPROFLOXACIN <=0.25 SENSITIVE Sensitive     GENTAMICIN <=1 SENSITIVE Sensitive     IMIPENEM <=0.25 SENSITIVE Sensitive     NITROFURANTOIN <=16 SENSITIVE Sensitive     TRIMETH/SULFA <=20 SENSITIVE Sensitive     AMPICILLIN/SULBACTAM 16 INTERMEDIATE Intermediate     PIP/TAZO <=4 SENSITIVE Sensitive     * >=100,000 COLONIES/mL ESCHERICHIA COLI  CULTURE, BLOOD (ROUTINE X 2) w Reflex to ID Panel     Status: None (Preliminary result)   Collection Time: 06/13/2019  6:18 PM   Specimen: BLOOD  Result Value Ref Range Status   Specimen Description BLOOD BLOOD RIGHT HAND  Final   Special Requests   Final    BOTTLES DRAWN AEROBIC AND ANAEROBIC Blood Culture adequate volume   Culture   Final    NO GROWTH 4 DAYS Performed at [facility], [address], [place], [place] [address]    Report Status PENDING  Incomplete  Respiratory Panel by RT PCR (Flu A&B, Covid) - Nasopharyngeal Swab     Status: None   Collection Time: 06/14/2019  9:14 PM   Specimen: Nasopharyngeal Swab  Result Value Ref Range Status   SARS Coronavirus 2 by RT PCR NEGATIVE NEGATIVE Final    Comment: (NOTE) SARS-CoV-2 target nucleic  acids are NOT DETECTED. The SARS-CoV-2 RNA is generally detectable in upper respiratoy specimens during the acute phase of infection. The lowest concentration of SARS-CoV-2 viral copies this assay can detect is 131 copies/mL. A negative result does not preclude SARS-Cov-2 infection and should not be used as the sole basis for treatment or other patient management decisions. A negative result may occur with  improper specimen collection/handling, submission of  specimen other than nasopharyngeal swab, presence of viral mutation(s) within the areas targeted by this assay, and inadequate number of viral copies (<131 copies/mL). A negative result must be combined with clinical observations, patient history, and epidemiological information. The expected result is Negative. Fact Sheet for Patients:  PinkCheek.be Fact Sheet for Healthcare Providers:  GravelBags.it This test is not yet ap proved or cleared by the [place] FDA and  has been authorized for detection and/or diagnosis of SARS-CoV-2 by FDA under an Emergency Use Authorization (EUA). This EUA will remain  in effect (meaning this test can be used) for the duration of the COVID-19 declaration under Section 564(b)(1) of the Act, 21 U.S.C. section 360bbb-3(b)(1), unless the authorization is terminated or revoked sooner.    Influenza A by PCR NEGATIVE NEGATIVE Final   Influenza B by PCR NEGATIVE NEGATIVE Final    Comment: (NOTE) The Xpert Xpress SARS-CoV-2/FLU/RSV assay is intended as an aid in  the diagnosis of influenza from Nasopharyngeal swab specimens and  should not be used as a sole basis for treatment. Nasal washings and  aspirates are unacceptable for Xpert Xpress SARS-CoV-2/FLU/RSV  testing. Fact Sheet for Patients: PinkCheek.be Fact Sheet for Healthcare Providers: GravelBags.it This test is not yet  approved or cleared by the [place] FDA and  has been authorized for detection and/or diagnosis of SARS-CoV-2 by  FDA under an Emergency Use Authorization (EUA). This EUA will remain  in effect (meaning this test can be used) for the duration of the  Covid-19 declaration under Section 564(b)(1) of the Act, 21  U.S.C. section 360bbb-3(b)(1), unless the authorization is  terminated or revoked. Performed at [facility], [address] [place], [place] [address]   MRSA PCR Screening     Status: None   Collection Time: 06/09/2019 10:35 PM   Specimen: Nasopharyngeal  Result Value Ref Range Status   MRSA by PCR NEGATIVE NEGATIVE Final    Comment:        The GeneXpert MRSA Assay (FDA approved for NASAL specimens only), is one component of a comprehensive MRSA colonization surveillance program. It is not intended to diagnose MRSA infection nor to guide or monitor treatment for MRSA infections. Performed at [facility], [address] [place], [place] [address]     Coagulation Studies: Recent Labs    06/02/19 0445 06/03/19 1133  LABPROT 36.7* 28.0*  INR 3.7* 2.6*    Urinalysis: No results for input(s): COLORURINE, LABSPEC, PHURINE, GLUCOSEU, HGBUR, BILIRUBINUR, KETONESUR, PROTEINUR, UROBILINOGEN, NITRITE, LEUKOCYTESUR in the last 72 hours.  Invalid input(s): APPERANCEUR    Imaging: No results found.   Medications:   . ciprofloxacin 400 mg (06/03/19 1423)  . dextrose 5 % with KCl 20 mEq / L 20 mEq (06/03/19 1454)  . metronidazole 500 mg (06/04/19 0458)  . norepinephrine (LEVOPHED) Adult infusion Stopped (05/25/2019 0045)  . pantoprazole (PROTONIX) IVPB    . potassium chloride     . sodium chloride   Intravenous Once  . sodium chloride   Intravenous Once  . carbidopa-levodopa  1 tablet Oral TID  . chlorhexidine gluconate (MEDLINE KIT)  15 mL Mouth Rinse BID  . Chlorhexidine Gluconate Cloth  6 each Topical Daily  . insulin aspart  0-6 Units  Subcutaneous Q6H  . ipratropium-albuterol  3 mL Nebulization TID  . lactulose  20 g Oral Q6H  . LORazepam  1 mg Intravenous Once  . mouth rinse  15 mL Mouth Rinse 10 times per day  . pantoprazole  40 mg Intravenous Q12H  . sodium chloride flush  3 mL Intravenous Q12H  . venlafaxine XR  300 mg Oral Q breakfast   acetaminophen **OR** acetaminophen, albuterol, atropine, fentaNYL (SUBLIMAZE) injection, fentaNYL (SUBLIMAZE) injection, hydrALAZINE, ondansetron **OR** ondansetron (ZOFRAN) IV, senna-docusate  Assessment/ Plan:  63 y.o. female  with Parkinson's, alcohol abuse, anxiety, bipolar disorder, amputation, history of left hip arthroplasty , right hip replacement, back surgery was admitted on 06/20/2019 with altered mental status, Acute resp failure, coagulopathy and arrhythmia/bradycardia.   #Acute kidney injury. Baseline creatinine of 0.85 on May 20, 2019 Kidney injury is likely secondary to ATN from hemodynamic instability, UTI, sepsis.   #Proteinuria - Obtain protein/cr ratio   #Rhabdomyolysis, mild - CK ~ 4000  #Atrial fibrillation with bradycardia, requiring temporary pacemaker placement -Cardiology team following  #Acute respiratory failure.   Intubated in ER December 8  #Acidosis -Likely multifactorial due to sepsis and renal failure  #Altered mental status -Due to sepsis  #GI bleed,  Coagulopathy, reported history of alcohol abuse. CT abdomen does not report cirrhosis but ascites and cholelithiasis and gallbladder sludge is noted.  #Hyperglycemia.   Hemoglobin A1c 5.2% June 01, 2019  # Pulm HTN echo 06/23/2019. LVEF 40-45%  # UTI, E Coli, 06/17/2019  #Shock liver    Plan: Urine output remains poor.  BUN, creatinine are worsening.  ICU team has placed left femoral dialysis catheter.  We will plan to dialyze today and reevaluate for dialysis on a daily basis.  broad spectrum Abx for sepsis as per icu team.  E. coli in urine/   Possible extubation  today Off pressors and sedation at present   LOS: [place] 12/12/20208:50 AM  [place], [place]  Note: This note was prepared with Dragon dictation. Any transcription errors are unintentional

## 2019-06-05 ENCOUNTER — Inpatient Hospital Stay: Payer: Medicare Other

## 2019-06-05 LAB — GLUCOSE, CAPILLARY
Glucose-Capillary: 104 mg/dL — ABNORMAL HIGH (ref 70–99)
Glucose-Capillary: 107 mg/dL — ABNORMAL HIGH (ref 70–99)
Glucose-Capillary: 108 mg/dL — ABNORMAL HIGH (ref 70–99)
Glucose-Capillary: 114 mg/dL — ABNORMAL HIGH (ref 70–99)
Glucose-Capillary: 127 mg/dL — ABNORMAL HIGH (ref 70–99)

## 2019-06-05 LAB — PHOSPHORUS: Phosphorus: 3.5 mg/dL (ref 2.5–4.6)

## 2019-06-05 LAB — CULTURE, BLOOD (ROUTINE X 2)
Culture: NO GROWTH
Special Requests: ADEQUATE

## 2019-06-05 LAB — BASIC METABOLIC PANEL
Anion gap: 18 — ABNORMAL HIGH (ref 5–15)
BUN: 74 mg/dL — ABNORMAL HIGH (ref 8–23)
CO2: 22 mmol/L (ref 22–32)
Calcium: 7.9 mg/dL — ABNORMAL LOW (ref 8.9–10.3)
Chloride: 97 mmol/L — ABNORMAL LOW (ref 98–111)
Creatinine, Ser: 4.29 mg/dL — ABNORMAL HIGH (ref 0.44–1.00)
GFR calc Af Amer: 12 mL/min — ABNORMAL LOW (ref >60)
GFR calc non Af Amer: 10 mL/min — ABNORMAL LOW (ref >60)
Glucose, Bld: 125 mg/dL — ABNORMAL HIGH (ref 70–99)
Potassium: 4 mmol/L (ref 3.5–5.1)
Sodium: 137 mmol/L (ref 135–145)

## 2019-06-05 LAB — HEMOGLOBIN AND HEMATOCRIT, BLOOD
HCT: 36.8 % (ref 36.0–46.0)
HCT: 37.5 % (ref 36.0–46.0)
Hemoglobin: 12.3 g/dL (ref 12.0–15.0)
Hemoglobin: 12.4 g/dL (ref 12.0–15.0)

## 2019-06-05 LAB — MAGNESIUM: Magnesium: 1.9 mg/dL (ref 1.7–2.4)

## 2019-06-05 LAB — PROTEIN / CREATININE RATIO, URINE
Creatinine, Urine: 76 mg/dL
Protein Creatinine Ratio: 3.37 mg/mg{Cre} — ABNORMAL HIGH (ref 0.00–0.15)
Total Protein, Urine: 256 mg/dL

## 2019-06-05 MED ORDER — ORAL CARE MOUTH RINSE
15.0000 mL | Freq: Two times a day (BID) | OROMUCOSAL | Status: DC
  Administered 2019-06-05: 15 mL via OROMUCOSAL

## 2019-06-05 MED ORDER — IPRATROPIUM-ALBUTEROL 0.5-2.5 (3) MG/3ML IN SOLN
3.0000 mL | Freq: Four times a day (QID) | RESPIRATORY_TRACT | Status: DC
  Administered 2019-06-06: 3 mL via RESPIRATORY_TRACT
  Filled 2019-06-05: qty 3

## 2019-06-05 NOTE — Consult Note (Signed)
[facility] for Electrolyte Monitoring and Replacement   Recent Labs: Potassium (mmol/L)  Date Value  06/05/2019 4.0  10/11/2014 4.1   Magnesium (mg/dL)  Date Value  06/05/2019 1.9   Calcium (mg/dL)  Date Value  06/05/2019 7.9 (L)   Calcium, Total (mg/dL)  Date Value  10/11/2014 9.1   Albumin (g/dL)  Date Value  06/02/2019 2.6 (L)  10/11/2014 4.3   Phosphorus (mg/dL)  Date Value  06/05/2019 3.5   Sodium (mmol/L)  Date Value  06/05/2019 137  10/11/2014 65   63 year old female admitted with GI bleed on protonix gtt with PMH of neuropathy, parkinson's syndrome, UTI, bipolar, cancer, and history of recurrent falls. Patient is intubated, but currently NPO.  Patient's lactulose was restarted.    Electrolytes Goal:  Electrolytes wnl. Continue D5 with KCl 20 mEq @ 50 mL/h No additional supplementation indicated. Will continue to monitor sodium and potassium for adjustments to fluids Pharmacy will monitor with AM labs    Tawnya Crook, PharmD 06/05/2019 7:23 AM

## 2019-06-05 NOTE — Plan of Care (Signed)
Patient remains alert while on vent. Able to mouth words.  Appropriate responses.  Denies pain and discomfort at this time.  Oozing noted at right fem site.  Minimal urine output.  Pressure dressing intact.  Will continue to monitor.

## 2019-06-05 NOTE — Progress Notes (Signed)
Patient extubated per Md order to [facility] with no complications, saturations are 100% at this time, will continue to monitor.

## 2019-06-05 NOTE — Progress Notes (Signed)
Follow up - Critical Care Medicine Note  Patient Details:    [name] is an 63 y.o. female with PMH of ETOH abuse and Parkinson's presented 05/27/2019 with Altered Mental Status.  Found to have AKI, Hypoglycemia, and Leukocytosis.  She decompensated (hypotensive, hypoxic, SOB, worsening mental status) in ED requiring intubation and vasopressors.  Noted to have frank blood in OG tube.  Requiring transcutaneous pacing with plans for transvenous pacer. Concern for Cardiogenic vs. Septic vs. Hemorrhagic shock.     Events: 12/8- Presented to ED 12/8- Decompensated in ED requiring intubation and vasopressors 12/8- right IJ CVC 12/9- Transvenous pacer placed, right femoral approach 12/11 - patient is able to follow some commands, transvenous pacer removed by cardio.  CVP this am 13.  No bloody output per OGT.   12/12 - s/p HD catheter placement, had SBT this am was unable to get adequate Vt on 10/5 PS, will attempt again after IHD 12/13 - extubated this am , doing well thus far.    Lines, Airways, Drains: Airway 7.5 mm (Active)  Secured at (cm) 22 cm 05/25/2019 1408  Measured From Lips 05/30/2019 [place] 05/27/2019 1408  Secured By [employer] 05/29/2019 1408  Tube Holder Repositioned Yes 06/08/2019 1408  Cuff Pressure (cm H2O) 24 cm H2O 06/12/2019 1408  Site Condition Dry 06/18/2019 1408     CVC Triple Lumen 06/18/2019 Right Internal jugular (Active)  Indication for Insertion or Continuance of Line Vasoactive infusions 05/26/2019 0800  Site Assessment Clean;Dry;Intact 06/13/2019 0800  Proximal Lumen Status Infusing;Blood return noted 06/13/2019 0800  Medial Lumen Status Infusing;Blood return noted 06/12/2019 0800  Distal Lumen Status Infusing;Blood return noted 06/16/2019 0800  Dressing Type Transparent;Occlusive 05/27/2019 0800  Dressing Status Clean;Dry;Intact 06/16/2019 0800  Dressing Change Due 06/07/19 06/13/2019 0800     Negative Pressure Wound Therapy Hip Left (Active)      NG/OG Tube Orogastric 18 Fr. 50 cm (Active)  Cm Marking at Nare/Corner of Mouth (if applicable) 50 cm 0000000 1200  Site Assessment Dry;Intact 06/03/2019 1200  Ongoing Placement Verification No change in cm markings or external length of tube from initial placement 06/13/2019 0400  Status Suction-low intermittent 06/18/2019 1200  Drainage Appearance Bloody 06/07/2019 1200  Output (mL) 0 mL 06/20/2019 0730     Urethral Catheter Claiborne Billings, RN Double-lumen 16 Fr. (Active)  Indication for Insertion or Continuance of Catheter Unstable critically ill patients first 24-48 hours (See Criteria) 06/08/2019 0800  Site Assessment Clean;Intact 06/22/2019 0800  Catheter Maintenance Bag below level of bladder;Catheter secured;Drainage bag/tubing not touching floor;Insertion date on drainage bag;No dependent loops;Seal intact 05/25/2019 0800  Collection Container Standard drainage bag 06/11/2019 0800  Securement Method Securing device (Describe) 06/22/2019 0800  Urinary Catheter Interventions (if applicable) Unclamped 0000000 0800  Output (mL) 15 mL 06/12/2019 1714    Anti-infectives:  Anti-infectives (From admission, onward)   Start     Dose/Rate Route Frequency Ordered Stop   06/03/19 1200  piperacillin-tazobactam (ZOSYN) IVPB 3.375 g  Status:  Discontinued     3.375 g 12.5 mL/hr over 240 Minutes Intravenous Every 12 hours 06/03/19 0724 06/03/19 1148   06/03/19 1200  ciprofloxacin (CIPRO) IVPB 400 mg     400 mg 200 mL/hr over 60 Minutes Intravenous Every 24 hours 06/03/19 1149     06/03/19 1200  metroNIDAZOLE (FLAGYL) IVPB 500 mg     500 mg 100 mL/hr over 60 Minutes Intravenous Every 8 hours 06/03/19 1149     06/03/19 0100  piperacillin-tazobactam (ZOSYN)  IVPB 3.375 g  Status:  Discontinued     3.375 g 12.5 mL/hr over 240 Minutes Intravenous Every 8 hours 06/02/19 1714 06/03/19 0724   06/12/2019 2130  piperacillin-tazobactam (ZOSYN) IVPB 3.375 g  Status:  Discontinued     3.375 g 12.5 mL/hr over 240 Minutes Intravenous  Every 8 hours 06/04/2019 2123 06/02/19 1709      Microbiology: Results for orders placed or performed during the hospital encounter of 05/28/2019  SARS CORONAVIRUS 2 (TAT 6-24 HRS) Nasopharyngeal Nasopharyngeal Swab     Status: None   Collection Time: 06/17/2019  2:54 PM   Specimen: Nasopharyngeal Swab  Result Value Ref Range Status   SARS Coronavirus 2 NEGATIVE NEGATIVE Final    Comment: (NOTE) SARS-CoV-2 target nucleic acids are NOT DETECTED. The SARS-CoV-2 RNA is generally detectable in upper and lower respiratory specimens during the acute phase of infection. Negative results do not preclude SARS-CoV-2 infection, do not rule out co-infections with other pathogens, and should not be used as the sole basis for treatment or other patient management decisions. Negative results must be combined with clinical observations, patient history, and epidemiological information. The expected result is Negative. Fact Sheet for Patients: SugarRoll.be Fact Sheet for Healthcare Providers: https://www.woods-mathews.com/ This test is not yet approved or cleared by the [place] FDA and  has been authorized for detection and/or diagnosis of SARS-CoV-2 by FDA under an Emergency Use Authorization (EUA). This EUA will remain  in effect (meaning this test can be used) for the duration of the COVID-19 declaration under Section 56 4(b)(1) of the Act, 21 U.S.C. section 360bbb-3(b)(1), unless the authorization is terminated or revoked sooner. Performed at [facility], [place] [address] [place], [place] [address]   Urine culture     Status: Abnormal   Collection Time: 06/04/2019  3:05 PM   Specimen: Urine, Random  Result Value Ref Range Status   Specimen Description   Final    URINE, RANDOM Performed at [facility], [address] [place], [place] [address]    Special Requests   Final    NONE Performed at [name], [address] [place], [place] [address]    Culture >=100,000 COLONIES/mL ESCHERICHIA COLI (A)  Final   Report Status 06/02/2019 FINAL  Final   Organism ID, Bacteria ESCHERICHIA COLI (A)  Final      Susceptibility   Escherichia coli - MIC*    AMPICILLIN >=32 RESISTANT Resistant     CEFAZOLIN <=4 SENSITIVE Sensitive     CEFTRIAXONE <=1 SENSITIVE Sensitive     CIPROFLOXACIN <=0.25 SENSITIVE Sensitive     GENTAMICIN <=1 SENSITIVE Sensitive     IMIPENEM <=0.25 SENSITIVE Sensitive     NITROFURANTOIN <=16 SENSITIVE Sensitive     TRIMETH/SULFA <=20 SENSITIVE Sensitive     AMPICILLIN/SULBACTAM 16 INTERMEDIATE Intermediate     PIP/TAZO <=4 SENSITIVE Sensitive     * >=100,000 COLONIES/mL ESCHERICHIA COLI  CULTURE, BLOOD (ROUTINE X 2) w Reflex to ID Panel     Status: None   Collection Time: 06/08/2019  6:18 PM   Specimen: BLOOD  Result Value Ref Range Status   Specimen Description BLOOD BLOOD RIGHT HAND  Final   Special Requests   Final    BOTTLES DRAWN AEROBIC AND ANAEROBIC Blood Culture adequate volume   Culture   Final    NO GROWTH 5 DAYS Performed at [facility], [address], [place], [place] [address]    Report Status 06/05/2019 FINAL  Final  Respiratory Panel by RT PCR (Flu A&B, Covid) - Nasopharyngeal Swab     Status: None   Collection Time: 05/24/2019  9:14 PM   Specimen: Nasopharyngeal Swab  Result Value Ref Range Status   SARS Coronavirus 2 by RT PCR NEGATIVE NEGATIVE Final    Comment: (NOTE) SARS-CoV-2 target nucleic acids are NOT DETECTED. The SARS-CoV-2 RNA is generally detectable in upper respiratoy specimens during the acute phase of infection. The lowest concentration of SARS-CoV-2 viral copies this assay can detect is 131 copies/mL. A negative result does not preclude SARS-Cov-2 infection and should not be used as the sole basis for treatment or other patient management decisions. A negative result may occur with  improper specimen collection/handling, submission of  specimen other than nasopharyngeal swab, presence of viral mutation(s) within the areas targeted by this assay, and inadequate number of viral copies (<131 copies/mL). A negative result must be combined with clinical observations, patient history, and epidemiological information. The expected result is Negative. Fact Sheet for Patients:  PinkCheek.be Fact Sheet for Healthcare Providers:  GravelBags.it This test is not yet ap proved or cleared by the [place] FDA and  has been authorized for detection and/or diagnosis of SARS-CoV-2 by FDA under an Emergency Use Authorization (EUA). This EUA will remain  in effect (meaning this test can be used) for the duration of the COVID-19 declaration under Section 564(b)(1) of the Act, 21 U.S.C. section 360bbb-3(b)(1), unless the authorization is terminated or revoked sooner.    Influenza A by PCR NEGATIVE NEGATIVE Final   Influenza B by PCR NEGATIVE NEGATIVE Final    Comment: (NOTE) The Xpert Xpress SARS-CoV-2/FLU/RSV assay is intended as an aid in  the diagnosis of influenza from Nasopharyngeal swab specimens and  should not be used as a sole basis for treatment. Nasal washings and  aspirates are unacceptable for Xpert Xpress SARS-CoV-2/FLU/RSV  testing. Fact Sheet for Patients: PinkCheek.be Fact Sheet for Healthcare Providers: GravelBags.it This test is not yet approved or cleared by the [place] FDA and  has been authorized for detection and/or diagnosis of SARS-CoV-2 by  FDA under an Emergency Use Authorization (EUA). This EUA will remain  in effect (meaning this test can be used) for the duration of the  Covid-19 declaration under Section 564(b)(1) of the Act, 21  U.S.C. section 360bbb-3(b)(1), unless the authorization is  terminated or revoked. Performed at [facility], [address]  [place], [place] [address]   MRSA PCR Screening     Status: None   Collection Time: 06/05/2019 10:35 PM   Specimen: Nasopharyngeal  Result Value Ref Range Status   MRSA by PCR NEGATIVE NEGATIVE Final    Comment:        The GeneXpert MRSA Assay (FDA approved for NASAL specimens only), is one component of a comprehensive MRSA colonization surveillance program. It is not intended to diagnose MRSA infection nor to guide or monitor treatment for MRSA infections. Performed at [facility], [address] [place], [place] [address]   CULTURES: Blood x2 12/8>> Urine 12/8>> E. coli Tracheal aspirate 12/8>> SARS-CoV-2 PCR 12/8>> NEGATIVE Influenza PCR 12/8>>NEGATIVE Respiratory Viral Panel 12/8>> NEGATIVE MRSA PCR 12/8>> NEGATIVE  Best Practice/Protocols:  VTE Prophylaxis: Mechanical GI Prophylaxis: Proton Pump Inhibitor  ANTIBIOTICS: Zosyn 12/8>>     Studies: EEG  Result Date: 06/09/2019 Lora Havens, MD     06/22/2019  5:22 PM Patient Name: ADLEIGH MONTIEL MRN: [id number] Epilepsy Attending: Lora Havens Referring Physician/Provider: Darel Hong, NP Date:  06/15/2019 Duration: 24.46mins Patient history: 63yo F with ams. EEG to evaluate for seizure Level of alertness: comatose AEDs during EEG study: Librium, lorazepam Technical aspects: This EEG study was done with scalp electrodes positioned according to the 10-20 International system of electrode placement. Electrical activity was acquired at a sampling rate of 500Hz  and reviewed with a high frequency filter of 70Hz  and a low frequency filter of 1Hz . EEG data were recorded continuously and digitally stored. DESCRIPTION: EEG showed continuous generalized 3-5hz  theta-delta slowing admixed with an excessive amount of 15 to 18 Hz, 2-3 uV beta activity with irregular morphology distributed symmetrically and diffusely. Hyperventilation and photic stimulation were not performed. Episode of left shoulder and feet and head twitching  was recorded per eeg tech annotation ( difficult to visualize on video). Concomitant eeg before, during and after the event didn't show any eeg change to suggest seizure. ABNORMALITY - Continuous slow, generalized - Excessive beta, generalized IMPRESSION: This study is suggestive of severe diffuse encephalopathy, non specific to etiology.  The excessive beta activity seen in the background is most likely due to the effect of benzodiazepine and is a benign EEG pattern. One episode of left shoulder, feet and head twitching was recorded without EEG change and was likely non epileptic. No definite seizures or epileptiform discharges were seen throughout the recording. Clinical correlation is recommended Priyanka Barbra Sarks   CT ABDOMEN PELVIS WO CONTRAST  Result Date: 06/21/2019 CLINICAL DATA:  Abdominal rigidity EXAM: CT ABDOMEN AND PELVIS WITHOUT CONTRAST TECHNIQUE: Multidetector CT imaging of the abdomen and pelvis was performed following the standard protocol without IV contrast. COMPARISON:  Ultrasound from 04/01/2019 FINDINGS: Lower chest: Small right pleural effusion is noted with bibasilar atelectasis right greater than left. No focal parenchymal nodules are seen. Gastric catheter is noted extending into the stomach. Hepatobiliary: Liver is well visualized and within normal limits. Gallbladder demonstrates some mild wall thickening likely reactive related to the ascites. Increased density is noted within consistent with cholelithiasis and gallbladder sludge similar to that noted on prior ultrasound. Pancreas: Unremarkable. No pancreatic ductal dilatation or surrounding inflammatory changes. Spleen: Normal in size without focal abnormality. Adrenals/Urinary Tract: Adrenal glands are within normal limits. Kidneys are well visualized bilaterally. No renal calculi or obstructive changes are seen. The bladder is decompressed by Foley catheter and obscured by bilateral hip replacements. Stomach/Bowel: Scattered  diverticular change of the colon is noted without evidence of diverticulitis. No obstructive or inflammatory changes are seen. The appendix is within normal limits. Some inflammatory changes are noted surrounding the second portion of the duodenum and head of the pancreas as well as extending into the mesentery inferiorly. This may be related to some focal duodenitis although may be reactive related to the mild ascites. Vascular/Lymphatic: Aortic atherosclerosis. No enlarged abdominal or pelvic lymph nodes. Reproductive: Status post hysterectomy. No adnexal masses. Other: No abdominal wall hernia or abnormality. No abdominopelvic ascites. Musculoskeletal: Bilateral hip replacements are noted. Degenerative changes of lumbar spine are seen. IMPRESSION: Small right pleural effusion with bibasilar atelectasis. Cholelithiasis and gallbladder sludge. Mild ascites. Inflammatory changes surrounding duodenum and head of the pancreas likely related to some duodenitis. No perforation or definitive ulcer crater is seen. These changes may be in part related to the underlying ascites as well. No other focal abnormality is noted. Electronically Signed   By: Inez Catalina M.D.   On: 06/15/2019 22:28   CT HEAD WO CONTRAST  Result Date: 06/22/2019 CLINICAL DATA:  Encephalopathy EXAM: CT HEAD WITHOUT CONTRAST TECHNIQUE: Contiguous  axial images were obtained from the base of the skull through the vertex without intravenous contrast. COMPARISON:  06/07/2019 FINDINGS: Brain: There is no mass, hemorrhage or extra-axial collection. The size and configuration of the ventricles and extra-axial CSF spaces are normal. The brain parenchyma is normal, without acute or chronic infarction. Vascular: No abnormal hyperdensity of the major intracranial arteries or dural venous sinuses. No intracranial atherosclerosis. Skull: The visualized skull base, calvarium and extracranial soft tissues are normal. Sinuses/Orbits: No fluid levels or advanced  mucosal thickening of the visualized paranasal sinuses. No mastoid or middle ear effusion. The orbits are normal. IMPRESSION: Normal head CT. Electronically Signed   By: Ulyses Jarred M.D.   On: 06/23/2019 02:53   CT Head Wo Contrast  Result Date: 06/07/2019 CLINICAL DATA:  Altered level of consciousness. Confusion. Parkinson's. EXAM: CT HEAD WITHOUT CONTRAST TECHNIQUE: Contiguous axial images were obtained from the base of the skull through the vertex without intravenous contrast. COMPARISON:  April 13, 2019 FINDINGS: Brain: No subdural, epidural, or subarachnoid hemorrhage identified. Cerebellum, brainstem, and basal cisterns are normal. No mass effect or midline shift. No acute cortical ischemia or infarct identified. Vascular: No hyperdense vessel or unexpected calcification. Skull: Normal. Negative for fracture or focal lesion. Sinuses/Orbits: No acute finding. Other: None. IMPRESSION: 1. No acute intracranial abnormalities are identified. 2. Electronically Signed   By: Dorise Bullion III M.D   On: 06/15/2019 14:29   CARDIAC CATHETERIZATION  Result Date: 05/30/2019 Successful transvenous temporary pacemaker  US RENAL  Result Date: 06/04/2019 CLINICAL DATA:  AKI EXAM: RENAL / URINARY TRACT ULTRASOUND COMPLETE COMPARISON:  None. FINDINGS: Right Kidney: Renal measurements: 10.4 x 4.5 x 5.6 cm = volume: 136 mL . Echogenicity is increased. No mass or hydronephrosis visualized. Left Kidney: Renal measurements: 10.2 x 5.3 x 4.3 cm = volume: 123 mL. Echogenicity is increased. No mass or hydronephrosis visualized. Bladder: Decompressed by Foley catheter. Other: Cholelithiasis, sludge, mild gallbladder wall thickening. Mild perinephric fluid. Free fluid is present in the pelvis. IMPRESSION: No hydronephrosis. Increased renal echogenicity reflecting medical renal disease. Mild free fluid. Gallbladder stones, sludge, and wall thickening. Electronically Signed   By: Macy Mis M.D.   On: 05/29/2019 11:35    DG Chest Port 1 View  Result Date: 06/02/2019 CLINICAL DATA:  Acute respiratory failure. EXAM: PORTABLE CHEST 1 VIEW COMPARISON:  Radiograph 06/14/2019 FINDINGS: Endotracheal tube tip 17 mm from the carina. Enteric tube tip and side-port below the diaphragm in the stomach. Right internal jugular central venous catheter tip in the upper SVC. Pacemaker from an inferior approach projects over the expected right ventricle. Increasing hazy opacity at the lung bases consistent with pleural effusions and atelectasis. Vascular congestion. No pneumothorax. IMPRESSION: 1. Worsening bilateral pleural effusions, right greater than left. 2. Vascular congestion. 3. Temporary pacemaker placement. Support apparatus otherwise unchanged. Electronically Signed   By: Keith Rake M.D.   On: 06/02/2019 05:08   DG Chest Port 1 View  Result Date: 06/23/2019 CLINICAL DATA:  Central line placement. EXAM: PORTABLE CHEST 1 VIEW COMPARISON:  Radiograph earlier this day. FINDINGS: Tip of the right internal jugular central venous catheter projects over the upper SVC. No visualized pneumothorax. Endotracheal tube and enteric tube remain in place. Borderline cardiomegaly. Unchanged vascular congestion. No new airspace disease. Streaky bibasilar atelectasis. IMPRESSION: 1. Tip of the right internal jugular central venous catheter projects over the upper SVC. No pneumothorax. 2. Unchanged vascular congestion. 3. Streaky bibasilar atelectasis. Electronically Signed   By: Aurther Loft.D.  On: 06/03/2019 00:19   DG Chest Portable 1 View  Result Date: 06/20/2019 CLINICAL DATA:  Respiratory distress. Endotracheal tube and OG tube placement. EXAM: PORTABLE CHEST 1 VIEW COMPARISON:  06/17/2019 FINDINGS: Endotracheal tube is noted with tip 2.8 cm above the carina. An OG tube is noted entering the stomach with tip just off of the field of view. This is a mildly low volume film. Cardiomegaly and mild pulmonary vascular congestion  noted. No pneumothorax or pleural effusion. Defibrillator/pacing pads overlying the chest noted. No acute bony abnormalities are identified. IMPRESSION: 1. Endotracheal tube with tip 2.8 cm above the carina. 2. OG tube entering the stomach with tip just off of the field of view. 3. Cardiomegaly with mild pulmonary vascular congestion. Electronically Signed   By: Margarette Canada M.D.   On: 06/01/2019 20:51   DG Chest Portable 1 View  Result Date: 06/11/2019 CLINICAL DATA:  Altered mental status. EXAM: PORTABLE CHEST 1 VIEW COMPARISON:  April 17, 2019. FINDINGS: The heart size and mediastinal contours are within normal limits. Both lungs are clear. The visualized skeletal structures are unremarkable. IMPRESSION: No active disease. Electronically Signed   By: Marijo Conception M.D.   On: 05/27/2019 14:02   DG Abd Portable 1 View  Result Date: 06/21/2019 CLINICAL DATA:  OG tube placement. EXAM: PORTABLE ABDOMEN - 1 VIEW COMPARISON:  None. FINDINGS: An OG tube is noted with tip overlying the mid stomach. Bowel gas pattern is unremarkable. IMPRESSION: 1. OG tube with tip overlying the mid stomach. 2. Unremarkable bowel gas pattern. Electronically Signed   By: Margarette Canada M.D.   On: 06/09/2019 20:52   ECHOCARDIOGRAM COMPLETE  Result Date: 06/18/2019   ECHOCARDIOGRAM REPORT   Patient Name:   Opha D Butner Date of Exam: 06/22/2019 Medical Rec #:  [id number]     Height:       65.0 in Accession #:    [id number]    Weight:       135.4 lb Date of Birth:  [birth year]     BSA:          1.68 m Patient Age:    22 years      BP:           133/70 mmHg Patient Gender: F             HR:           70 bpm. Exam Location:  [facility] Procedure: 2D Echo, Color Doppler and Cardiac Doppler Indications:     R55 Syncope  History:         Patient has no prior history of Echocardiogram examinations.                  ETOH abuse.  Sonographer:     Charmayne Sheer RDCS (AE) Referring Phys:  [id number] TAWFIKUL ALAM Diagnosing Phys: Isaias Cowman MD   Sonographer Comments: Echo performed with patient supine and on artificial respirator. IMPRESSIONS  1. Left ventricular ejection fraction, by visual estimation, is 40 to 45%. The left ventricle has normal function. There is no left ventricular hypertrophy.  2. Mildly dilated left ventricular internal cavity size.  3. The left ventricle has no regional wall motion abnormalities.  4. Global right ventricle has normal systolic function.The right ventricular size is normal. No increase in right ventricular wall thickness.  5. Left atrial size was mild-moderately dilated.  6. Right atrial size was normal.  7. The mitral valve is normal in structure. Moderate mitral valve regurgitation.  No evidence of mitral stenosis.  8. The tricuspid valve is normal in structure. Tricuspid valve regurgitation is not demonstrated.  9. The aortic valve is normal in structure. Aortic valve regurgitation is not visualized. No evidence of aortic valve sclerosis or stenosis. 10. The pulmonic valve was normal in structure. Pulmonic valve regurgitation is not visualized. 11. Moderately elevated pulmonary artery systolic pressure. 12. The inferior vena cava is normal in size with greater than 50% respiratory variability, suggesting right atrial pressure of 3 mmHg. FINDINGS  Left Ventricle: Left ventricular ejection fraction, by visual estimation, is 40 to 45%. The left ventricle has normal function. The left ventricle has no regional wall motion abnormalities. The left ventricular internal cavity size was mildly dilated left ventricle. There is no left ventricular hypertrophy. Normal left atrial pressure. Right Ventricle: The right ventricular size is normal. No increase in right ventricular wall thickness. Global RV systolic function is has normal systolic function. The tricuspid regurgitant velocity is 2.96 m/s, and with an assumed right atrial pressure  of 10 mmHg, the estimated right ventricular systolic pressure is moderately elevated at  45.2 mmHg. Left Atrium: Left atrial size was mild-moderately dilated. Right Atrium: Right atrial size was normal in size Pericardium: There is no evidence of pericardial effusion. Mitral Valve: The mitral valve is normal in structure. Moderate mitral valve regurgitation. No evidence of mitral valve stenosis by observation. MV peak gradient, 6.2 mmHg. Tricuspid Valve: The tricuspid valve is normal in structure. Tricuspid valve regurgitation is not demonstrated. Aortic Valve: The aortic valve is normal in structure. Aortic valve regurgitation is not visualized. The aortic valve is structurally normal, with no evidence of sclerosis or stenosis. Aortic valve mean gradient measures 2.0 mmHg. Aortic valve peak gradient measures 3.7 mmHg. Aortic valve area, by VTI measures 1.32 cm. Pulmonic Valve: The pulmonic valve was normal in structure. Pulmonic valve regurgitation is not visualized. Pulmonic regurgitation is not visualized. Aorta: The aortic root, ascending aorta and aortic arch are all structurally normal, with no evidence of dilitation or obstruction. Venous: The inferior vena cava is normal in size with greater than 50% respiratory variability, suggesting right atrial pressure of 3 mmHg. IAS/Shunts: No atrial level shunt detected by color flow Doppler. There is no evidence of a patent foramen ovale. No ventricular septal defect is seen or detected. There is no evidence of an atrial septal defect.  LEFT VENTRICLE PLAX 2D LVIDd:         4.94 cm LVIDs:         4.14 cm LV PW:         0.84 cm LV IVS:        0.65 cm LVOT diam:     1.80 cm LV SV:         39 ml LV SV Index:   23.20 LVOT Area:     2.54 cm  LV Volumes (MOD) LV area d, A2C:    20.50 cm LV area d, A4C:    20.50 cm LV area s, A2C:    14.00 cm LV area s, A4C:    15.60 cm LV major d, A2C:   6.47 cm LV major d, A4C:   5.89 cm LV major s, A2C:   5.74 cm LV major s, A4C:   5.62 cm LV vol d, MOD A2C: 53.7 ml LV vol d, MOD A4C: 57.7 ml LV vol s, MOD A2C: 28.2 ml  LV vol s, MOD A4C: 36.1 ml LV SV MOD A2C:     25.5  ml LV SV MOD A4C:     57.7 ml LV SV MOD BP:      26.1 ml RIGHT VENTRICLE RV Basal diam:  3.13 cm LEFT ATRIUM             Index       RIGHT ATRIUM           Index LA diam:        4.20 cm 2.51 cm/m  RA Area:     17.40 cm LA Vol (A2C):   80.6 ml 48.10 ml/m RA Volume:   48.40 ml  28.88 ml/m LA Vol (A4C):   78.9 ml 47.08 ml/m LA Biplane Vol: 82.7 ml 49.35 ml/m  AORTIC VALVE                   PULMONIC VALVE AV Area (Vmax):    1.51 cm    PV Vmax:       0.75 m/s AV Area (Vmean):   1.39 cm    PV Vmean:      48.300 cm/s AV Area (VTI):     1.32 cm    PV VTI:        0.117 m AV Vmax:           95.70 cm/s  PV Peak grad:  2.3 mmHg AV Vmean:          62.900 cm/s PV Mean grad:  1.0 mmHg AV VTI:            0.162 m AV Peak Grad:      3.7 mmHg AV Mean Grad:      2.0 mmHg LVOT Vmax:         56.90 cm/s LVOT Vmean:        34.400 cm/s LVOT VTI:          0.084 m LVOT/AV VTI ratio: 0.52  AORTA Ao Root diam: 2.80 cm MITRAL VALVE                         TRICUSPID VALVE MV Area (PHT): 4.89 cm              TR Peak grad:   35.2 mmHg MV Peak grad:  6.2 mmHg              TR Vmax:        304.00 cm/s MV Mean grad:  2.0 mmHg MV Vmax:       1.24 m/s              SHUNTS MV Vmean:      65.6 cm/s             Systemic VTI:  0.08 m MV VTI:        0.29 m                Systemic Diam: 1.80 cm MV PHT:        44.95 msec MV Decel Time: 155 msec MV E velocity: 107.00 cm/s 103 cm/s MV A velocity: 39.80 cm/s  70.3 cm/s MV E/A ratio:  2.69        1.5  Isaias Cowman MD Electronically signed by Isaias Cowman MD Signature Date/Time: 06/18/2019/1:40:24 PM    Final     Consults: Treatment Team:  Isaias Cowman, MD Tyler Pita, MD Leotis Pain, MD Murlean Iba, MD Efrain Sella, MD   Subjective:    Overnight Issues: Intubated and mechanically ventilated.  Remains encephalopathic  Objective:  Vital signs for last 24 hours: Temp:  [98.2 F (36.8 C)-99 F (37.2  C)] 98.2 F (36.8 C) (12/13 1200) Pulse Rate:  [54-64] 60 (12/13 1300) Resp:  [14-31] 19 (12/13 1300) BP: (93-129)/(56-100) 121/75 (12/13 1300) SpO2:  [96 %-100 %] 99 % (12/13 1300) FiO2 (%):  [30 %-35 %] 30 % (12/13 0805) Weight:  [72.2 kg] 72.2 kg (12/13 0321)  Hemodynamic parameters for last 24 hours: CVP:  [4 mmHg-21 mmHg] 13 mmHg  Intake/Output from previous day: 12/12 0701 - 12/13 0700 In: 2464.9 [I.V.:1865; IV Piggyback:599.9] Out: 1169 [Urine:140; Emesis/NG output:175]  Intake/Output this shift: Total I/O In: 670.9 [I.V.:541; NG/GT:30; IV Piggyback:100] Out: -   Vent settings for last 24 hours: Vent Mode: PSV FiO2 (%):  [30 %-35 %] 30 % PEEP:  [5 cmH20] 5 cmH20 Pressure Support:  [0 cmH20-10 cmH20] 0 cmH20  Physical Exam:  General: Critically ill appearing female, intubated, synchronous with the vent, Neuro:  Unresponsive on no sedation, pupils fixed and dilated (6 mm bilaterally), no corneal reflex, no cough/gag reflex, opens eyes, some nystagmus noted, no tracking HEENT:  Atraumatic, normocephalic, neck supple, no JVD Cardiovascular:  Irregular rhythm, bradycardia Lungs:  Coarse breath sounds throughout, vent assisted Abdomen:  Soft, nontender, nondistended, no guarding or rebound tenderness, Hypoactive bowel sounds Musculoskeletal:  LUE amputation Skin:  Cool, mottled, no obvious rashes, lesions, or ulcerations  Assessment/Plan:  PULMONARY A: Acute Hypoxic Respiratory Failure, likely secondary to severe metabolic acidosis and pulmonary vascular congestion Hx: Asthma, no exacerbation P:   -Full vent support -Follow intermittent CXR & ABG as needed -Wean FiO2 & PEEP as tolerated -Spontaneous breathing trial- failed today  -PRN Bronchodilators   CARDIOVASCULAR A:  Hypotension, likely Cardiogenic shock vs.Septic shock vs.Hemorrhagic shock Bradycardia-RESOLVED P:  -Continuous cardiac monitoring -Maintain MAP >65 -IV fluids -Levophed as needed to  maintain MAP goal -Trend CVP -Cardiology following, appreciate input -Transvenous pacer has helped hemodynamics -Trend troponin -Check BNP -Echocardiogram performed EF is 40 to 45%, normal RV, moderate MR  RENAL A:   AKI likely ATN Anion gap Metabolic acidosis Rhabdomyolysis Hyperkalemia>>resolved Hyponatremia>>resolved P:   -Monitor I&O's / urinary output -Follow BMP -Ensure adequate renal perfusion -Avoid nephrotoxic agents as able, pharmacy consultation for dosing of medications -Replace electrolytes as indicated -Nephrology consulted,-S/P HD cath, will have IHD today -IV fluids -Follow serum CK -Renal US: No hydronephrosis, medical renal disease -Bicarb gtt, discontinued   GASTROINTESTINAL A:   GI Bleed Cholelithiasis w/ Gallbladder sludge CT Abdomen & Pelvis w/ Duodenitis Elevated LFT's, likely shock liver Hyperammonemia P:   -NPO -Protonix gtt -GI consulted, appreciate input, discussed with Dr. Alice Reichert, check acetaminophen level, empiric NAC -Transfuse as indicated -Consider General Surgery consult -Trend LFT's & Ammonia -Check Hepatitis panel  HEMATOLOGIC A:   Acute blood loss anemia Supratherapeutic INR, likely in setting of liver failure vs. DIC P:  -Monitor for S/Sx of bleeding -Trend CBC -SCD's for VTE Prophylaxis (no chemical prophylaxis given GI bleed) -Transfuse for Hgb <8 -Trend bleeding times -Check DIC panel -Consider FFP & Vitamin K   INFECTIOUS A:   Leukocytosis Meets SIRS Criteria, CT Abdomen with Duodenitis P:   -Monitor fever curve -Trend WBC's & Procalcitonin -Continue empiric Zosyn -Follow pan cultures as above -CXR without infiltrate -CT Abdomen with Cholelithiasis and gallbladder slude, and with Duodenitis (no perforation noted) -will narrow to cipro/flagyl -  Nasal mrsa pcr is negative  ENDOCRINE A:   Hypoglycemia   P:   -CBG's -Follow ICU Hypo/hyperglycemia protocol -D5 infusion  NEUROLOGIC A:    Altered mental status, now unresponsive without corneal & gag/cough reflexes; concern for anoxic injury Hx: Parkinson's, Bipolar disorder P:   -RASS goal: 0 to -1 -Fentanyl pushes as needed to maintain RASS goal -Daily wake up assessments -CT head negative 12/8 -Will repeat CT Head due to poor Neuro exam upon arrival to ICU>> repeat CT Head normal -Obtained EEG: No seizures, diffuse encephalopathy, nonspecific.  Excessive beta activity consistent with benzodiazepine -Consult Neurology, appreciate input -Urine drug screen positive for benzodiazepines -On standing dose Librium for potential EtOH withdrawal, discontinue    LOS: 5 days   Critical care provider statement:    Critical care time (minutes):  33   Critical care time was exclusive of:  Separately billable procedures and  treating other patients   Critical care was necessary to treat or prevent imminent or  life-threatening deterioration of the following conditions:   Circulatory shock, acute blood loss anemia GI bleed, altered mental status with encephalopathy, coagulopathy, severe AKI, multiple comorbid conditions   Critical care was time spent personally by me on the following  activities:  Development of treatment plan with patient or surrogate,  discussions with consultants, evaluation of patient's response to  treatment, examination of patient, obtaining history from patient or  surrogate, ordering and performing treatments and interventions, ordering  and review of laboratory studies and re-evaluation of patient's condition   I assumed direction of critical care for this patient from another  provider in my specialty: no     Ottie Glazier, M.D.  Pulmonary & Critical Care Medicine  [place] during the described time interval was provided by me and/or other providers on the critical care team.  I have reviewed this patient's available data, including medical history, events of note,  physical examination and test results as part of my evaluation.  **This note was dictated using voice recognition software/Dragon.  Despite best efforts to proofread, errors can occur which can change the meaning.  Any change was purely unintentional.

## 2019-06-05 NOTE — Plan of Care (Signed)

## 2019-06-05 NOTE — Progress Notes (Addendum)
Shift summary:  0845 hrs  - Patient on PSV overnight, tolerated well. Follows commands. VSS.  - Plan for extubation this AM.   1550 hrs  - Patient failed [facility] swallow screen. SLP consult placed.

## 2019-06-05 NOTE — Progress Notes (Signed)
[place], [place] 06/05/19  Subjective:   Hospital day # 5  Patient was extubated today.  Alert, able to follow simple commands Renal: 12/12 0701 - 12/13 0700 In: 2464.9 [I.V.:1865; IV Piggyback:599.9] Out: 1169 [Urine:140; Emesis/NG output:175] Lab Results  Component Value Date   CREATININE 4.29 (H) 06/05/2019   CREATININE 4.63 (H) 06/04/2019   CREATININE 4.10 (H) 06/03/2019     Objective:  Vital signs in last 24 hours:  Temp:  [98.2 F (36.8 C)-99 F (37.2 C)] 98.2 F (36.8 C) (12/13 1200) Pulse Rate:  [54-65] 58 (12/13 1200) Resp:  [14-31] 20 (12/13 1200) BP: (93-129)/(56-102) 121/74 (12/13 1200) SpO2:  [95 %-100 %] 100 % (12/13 1200) FiO2 (%):  [30 %-35 %] 30 % (12/13 0805) Weight:  [72.2 kg] 72.2 kg (12/13 0321)  Weight change: 0.3 kg Filed Weights   06/04/19 0500 06/04/19 1300 06/05/19 0321  Weight: 71.7 kg 72 kg 72.2 kg    Intake/Output:    Intake/Output Summary (Last 24 hours) at 06/05/2019 1309 Last data filed at 06/05/2019 1200 Gross per 24 hour  Intake 2985.86 ml  Output 1169 ml  Net 1816.86 ml     Physical Exam: General: Critically ill appearing  HEENT  moist oral mucous membranes  Pulm/lungs  coarse breath sounds bilaterally  CVS/Heart regular , in 70's  Abdomen:  Soft, non distended  Extremities: trace edema  Neurologic:  Able to follow simple commands  Skin: warm   foley       Basic Metabolic Panel:  Recent Labs  Lab 05/30/2019 0429 06/02/19 0445 06/03/19 0538 06/03/19 1133 06/04/19 0515 06/05/19 0549  NA 139 147* 145  --  141 137  K 4.0 3.6 3.5  --  3.6 4.0  CL 104 102 101  --  100 97*  CO2 18* 24 22  --  23 22  GLUCOSE 291* 116* 136*  --  140* 125*  BUN 60* 71* 86*  --  91* 74*  CREATININE 1.90* 2.72* 4.10*  --  4.63* 4.29*  CALCIUM 6.8* 6.6* 6.7*  --  7.1* 7.9*  MG  --   --  1.9  --  1.9 1.9  PHOS  --   --   --  4.0 3.6 3.5     CBC: Recent Labs  Lab 06/05/2019 1228 06/11/2019 2049  06/05/2019 0429 06/02/19 0445 06/03/19 0538 06/04/19 0515 06/04/19 1129 06/04/19 1805 06/04/19 2240 06/05/19 0549 06/05/19 1000  WBC 16.0* 23.1* 27.4* 20.8* 20.7* 17.8*  --   --   --   --   --   NEUTROABS 12.4* 18.3*  --  16.5*  --   --   --   --   --   --   --   HGB 11.6* 11.2* 14.3 14.7 13.4 12.1 13.7 12.7 12.1 12.3 12.4  HCT 36.4 37.3 45.0 44.8 40.9 35.9* 41.2 37.6 37.0 36.8 37.5  MCV 91.2 98.9 91.1 87.3 87.6 84.3  --   --   --   --   --   PLT 381 244 158 167 147* 101*  --   --   --   --   --       Lab Results  Component Value Date   HEPBSAG NON REACTIVE 05/28/2019   HEPBIGM NON REACTIVE 06/19/2019      Microbiology:  Recent Results (from the past 240 hour(s))  SARS CORONAVIRUS 2 (TAT 6-24 HRS) Nasopharyngeal Nasopharyngeal Swab     Status: None   Collection  Time: 05/29/2019  2:54 PM   Specimen: Nasopharyngeal Swab  Result Value Ref Range Status   SARS Coronavirus 2 NEGATIVE NEGATIVE Final    Comment: (NOTE) SARS-CoV-2 target nucleic acids are NOT DETECTED. The SARS-CoV-2 RNA is generally detectable in upper and lower respiratory specimens during the acute phase of infection. Negative results do not preclude SARS-CoV-2 infection, do not rule out co-infections with other pathogens, and should not be used as the sole basis for treatment or other patient management decisions. Negative results must be combined with clinical observations, patient history, and epidemiological information. The expected result is Negative. Fact Sheet for Patients: SugarRoll.be Fact Sheet for Healthcare Providers: https://www.woods-mathews.com/ This test is not yet approved or cleared by the [place] FDA and  has been authorized for detection and/or diagnosis of SARS-CoV-2 by FDA under an Emergency Use Authorization (EUA). This EUA will remain  in effect (meaning this test can be used) for the duration of the COVID-19 declaration under Section 56  4(b)(1) of the Act, 21 U.S.C. section 360bbb-3(b)(1), unless the authorization is terminated or revoked sooner. Performed at [facility], [place] [address], [place], [place] [address]   Urine culture     Status: Abnormal   Collection Time: 06/20/2019  3:05 PM   Specimen: Urine, Random  Result Value Ref Range Status   Specimen Description   Final    URINE, RANDOM Performed at [facility], [address] [place], [place] [address]    Special Requests   Final    NONE Performed at [name], [address] [place], [place] [address]    Culture >=100,000 COLONIES/mL ESCHERICHIA COLI (A)  Final   Report Status 06/02/2019 FINAL  Final   Organism ID, Bacteria ESCHERICHIA COLI (A)  Final      Susceptibility   Escherichia coli - MIC*    AMPICILLIN >=32 RESISTANT Resistant     CEFAZOLIN <=4 SENSITIVE Sensitive     CEFTRIAXONE <=1 SENSITIVE Sensitive     CIPROFLOXACIN <=0.25 SENSITIVE Sensitive     GENTAMICIN <=1 SENSITIVE Sensitive     IMIPENEM <=0.25 SENSITIVE Sensitive     NITROFURANTOIN <=16 SENSITIVE Sensitive     TRIMETH/SULFA <=20 SENSITIVE Sensitive     AMPICILLIN/SULBACTAM 16 INTERMEDIATE Intermediate     PIP/TAZO <=4 SENSITIVE Sensitive     * >=100,000 COLONIES/mL ESCHERICHIA COLI  CULTURE, BLOOD (ROUTINE X 2) w Reflex to ID Panel     Status: None   Collection Time: 05/25/2019  6:18 PM   Specimen: BLOOD  Result Value Ref Range Status   Specimen Description BLOOD BLOOD RIGHT HAND  Final   Special Requests   Final    BOTTLES DRAWN AEROBIC AND ANAEROBIC Blood Culture adequate volume   Culture   Final    NO GROWTH 5 DAYS Performed at [name], [address] [place],  [address]    Report Status 06/05/2019 FINAL  Final  Respiratory Panel by RT PCR (Flu A&B, Covid) - Nasopharyngeal Swab     Status: None   Collection Time: 06/17/2019  9:14 PM   Specimen: Nasopharyngeal Swab  Result Value Ref Range Status   SARS Coronavirus 2 by RT  PCR NEGATIVE NEGATIVE Final    Comment: (NOTE) SARS-CoV-2 target nucleic acids are NOT DETECTED. The SARS-CoV-2 RNA is generally detectable in upper respiratoy specimens during the acute phase of infection. The lowest concentration of SARS-CoV-2 viral copies this assay can detect is 131 copies/mL. A negative result does not preclude  SARS-Cov-2 infection and should not be used as the sole basis for treatment or other patient management decisions. A negative result may occur with  improper specimen collection/handling, submission of specimen other than nasopharyngeal swab, presence of viral mutation(s) within the areas targeted by this assay, and inadequate number of viral copies (<131 copies/mL). A negative result must be combined with clinical observations, patient history, and epidemiological information. The expected result is Negative. Fact Sheet for Patients:  PinkCheek.be Fact Sheet for Healthcare Providers:  GravelBags.it This test is not yet ap proved or cleared by the [place] FDA and  has been authorized for detection and/or diagnosis of SARS-CoV-2 by FDA under an Emergency Use Authorization (EUA). This EUA will remain  in effect (meaning this test can be used) for the duration of the COVID-19 declaration under Section 564(b)(1) of the Act, 21 U.S.C. section 360bbb-3(b)(1), unless the authorization is terminated or revoked sooner.    Influenza A by PCR NEGATIVE NEGATIVE Final   Influenza B by PCR NEGATIVE NEGATIVE Final    Comment: (NOTE) The Xpert Xpress SARS-CoV-2/FLU/RSV assay is intended as an aid in  the diagnosis of influenza from Nasopharyngeal swab specimens and  should not be used as a sole basis for treatment. Nasal washings and  aspirates are unacceptable for Xpert Xpress SARS-CoV-2/FLU/RSV  testing. Fact Sheet for Patients: PinkCheek.be Fact Sheet for Healthcare  Providers: GravelBags.it This test is not yet approved or cleared by the [place] FDA and  has been authorized for detection and/or diagnosis of SARS-CoV-2 by  FDA under an Emergency Use Authorization (EUA). This EUA will remain  in effect (meaning this test can be used) for the duration of the  Covid-19 declaration under Section 564(b)(1) of the Act, 21  U.S.C. section 360bbb-3(b)(1), unless the authorization is  terminated or revoked. Performed at [facility], [address] [place], [place] [address]   MRSA PCR Screening     Status: None   Collection Time: 06/13/2019 10:35 PM   Specimen: Nasopharyngeal  Result Value Ref Range Status   MRSA by PCR NEGATIVE NEGATIVE Final    Comment:        The GeneXpert MRSA Assay (FDA approved for NASAL specimens only), is one component of a comprehensive MRSA colonization surveillance program. It is not intended to diagnose MRSA infection nor to guide or monitor treatment for MRSA infections. Performed at [facility], [address] [place], [place] [address]     Coagulation Studies: Recent Labs    06/03/19 1133  LABPROT 28.0*  INR 2.6*    Urinalysis: No results for input(s): COLORURINE, LABSPEC, PHURINE, GLUCOSEU, HGBUR, BILIRUBINUR, KETONESUR, PROTEINUR, UROBILINOGEN, NITRITE, LEUKOCYTESUR in the last 72 hours.  Invalid input(s): APPERANCEUR    Imaging: No results found.   Medications:   . ciprofloxacin 400 mg (06/04/19 1400)  . dextrose 5 % with KCl 20 mEq / L 20 mEq (06/05/19 0750)  . metronidazole 500 mg (06/05/19 1210)   . carbidopa-levodopa  1 tablet Oral TID  . chlorhexidine gluconate (MEDLINE KIT)  15 mL Mouth Rinse BID  . Chlorhexidine Gluconate Cloth  6 each Topical Daily  . insulin aspart  0-6 Units Subcutaneous Q6H  . ipratropium-albuterol  3 mL Nebulization TID  . lactulose  20 g Oral Q6H  . mouth rinse  15 mL Mouth Rinse BID  . pantoprazole  40 mg  Intravenous Q12H  . sodium chloride flush  3 mL Intravenous Q12H  . venlafaxine XR  300 mg Oral Q  breakfast   acetaminophen **OR** acetaminophen, albuterol, atropine, hydrALAZINE, ondansetron **OR** ondansetron (ZOFRAN) IV, senna-docusate  Assessment/ Plan:  63 y.o. female  with Parkinson's, alcohol abuse, anxiety, bipolar disorder, amputation, history of left hip arthroplasty , right hip replacement, back surgery was admitted on 06/04/2019 with altered mental status, Acute resp failure, coagulopathy and arrhythmia/bradycardia.   #Acute kidney injury. Baseline creatinine of 0.85 on May 20, 2019 Kidney injury is likely secondary to ATN from hemodynamic instability, UTI, sepsis.   #Proteinuria - Obtain protein/cr ratio   #Rhabdomyolysis, mild - CK ~ 4000  #Atrial fibrillation with bradycardia, requiring temporary pacemaker placement -Cardiology team following  #Acute respiratory failure.   Intubated in ER December 8, extubated December 13  #Acidosis -Likely multifactorial due to sepsis and renal failure  #Altered mental status -Due to sepsis  #GI bleed,  Coagulopathy, reported history of alcohol abuse. CT abdomen does not report cirrhosis but ascites and cholelithiasis and gallbladder sludge is noted.  #Hyperglycemia.   Hemoglobin A1c 5.2% June 01, 2019  # Pulm HTN echo 06/13/2019. LVEF 40-45%  # UTI, E Coli, 05/29/2019  #Shock liver    Plan: Urine output remains poor.  BUN, creatinine are worsening.  ICU team has placed left femoral dialysis catheter Underwent first hemodialysis treatment on Saturday, December 12 Next hemodialysis planned for Monday, December 14.  broad spectrum Abx for sepsis as per icu team.  E. coli in urine/   Extubated today.  Overall doing fair   LOS: [place] 12/13/20201:09 PM  [place], [place]  Note: This note was prepared with Dragon dictation. Any transcription errors  are unintentional

## 2019-06-06 ENCOUNTER — Inpatient Hospital Stay: Payer: Medicare Other

## 2019-06-06 DIAGNOSIS — I5021 Acute systolic (congestive) heart failure: Secondary | ICD-10-CM

## 2019-06-06 LAB — CBC
HCT: 37.3 % (ref 36.0–46.0)
Hemoglobin: 12.9 g/dL (ref 12.0–15.0)
MCH: 28.6 pg (ref 26.0–34.0)
MCHC: 34.6 g/dL (ref 30.0–36.0)
MCV: 82.7 fL (ref 80.0–100.0)
Platelets: 116 10*3/uL — ABNORMAL LOW (ref 150–400)
RBC: 4.51 MIL/uL (ref 3.87–5.11)
RDW: 17.2 % — ABNORMAL HIGH (ref 11.5–15.5)
WBC: 19.4 10*3/uL — ABNORMAL HIGH (ref 4.0–10.5)
nRBC: 0.3 % — ABNORMAL HIGH (ref 0.0–0.2)

## 2019-06-06 LAB — BASIC METABOLIC PANEL
Anion gap: 21 — ABNORMAL HIGH (ref 5–15)
BUN: 75 mg/dL — ABNORMAL HIGH (ref 8–23)
CO2: 20 mmol/L — ABNORMAL LOW (ref 22–32)
Calcium: 8.3 mg/dL — ABNORMAL LOW (ref 8.9–10.3)
Chloride: 94 mmol/L — ABNORMAL LOW (ref 98–111)
Creatinine, Ser: 5.13 mg/dL — ABNORMAL HIGH (ref 0.44–1.00)
GFR calc Af Amer: 9 mL/min — ABNORMAL LOW (ref >60)
GFR calc non Af Amer: 8 mL/min — ABNORMAL LOW (ref >60)
Glucose, Bld: 85 mg/dL (ref 70–99)
Potassium: 4.6 mmol/L (ref 3.5–5.1)
Sodium: 135 mmol/L (ref 135–145)

## 2019-06-06 LAB — GLUCOSE, CAPILLARY
Glucose-Capillary: 93 mg/dL (ref 70–99)
Glucose-Capillary: 107 mg/dL — ABNORMAL HIGH (ref 70–99)

## 2019-06-06 LAB — BPAM FFP
Blood Product Expiration Date: 202012162359
Blood Product Expiration Date: 202012162359
ISSUE DATE / TIME: 202012111710
Unit Type and Rh: 6200
Unit Type and Rh: 6200

## 2019-06-06 LAB — PREPARE FRESH FROZEN PLASMA
Unit division: 0
Unit division: 0

## 2019-06-06 MED ORDER — MORPHINE BOLUS VIA INFUSION
5.0000 mg | INTRAVENOUS | Status: DC | PRN
  Filled 2019-06-06: qty 5

## 2019-06-06 MED ORDER — ACETAMINOPHEN 325 MG PO TABS: 650.0000 mg | ORAL_TABLET | Freq: Four times a day (QID) | ORAL | Status: DC | PRN

## 2019-06-06 MED ORDER — MORPHINE 100MG IN NS 100ML (1MG/ML) PREMIX INFUSION
0.0000 mg/h | INTRAVENOUS | Status: DC
  Filled 2019-06-06: qty 100

## 2019-06-06 MED ORDER — MORPHINE SULFATE (PF) 2 MG/ML IV SOLN: 2.0000 mg | INTRAVENOUS | Status: DC | PRN

## 2019-06-06 MED ORDER — GLYCOPYRROLATE 0.2 MG/ML IJ SOLN: 0.2000 mg | INTRAMUSCULAR | Status: DC | PRN

## 2019-06-06 MED ORDER — MORPHINE 100MG IN NS 100ML (1MG/ML) PREMIX INFUSION
1.0000 mg/h | INTRAVENOUS | Status: DC
  Administered 2019-06-06: 5 mg/h via INTRAVENOUS
  Filled 2019-06-06: qty 100

## 2019-06-06 MED ORDER — DEXTROSE 5 % IV SOLN: INTRAVENOUS | Status: DC

## 2019-06-06 MED ORDER — POLYVINYL ALCOHOL 1.4 % OP SOLN
1.0000 [drp] | Freq: Four times a day (QID) | OPHTHALMIC | Status: DC | PRN
  Filled 2019-06-06: qty 15

## 2019-06-06 MED ORDER — ACETAMINOPHEN 650 MG RE SUPP: 650.0000 mg | Freq: Four times a day (QID) | RECTAL | Status: DC | PRN

## 2019-06-06 MED ORDER — FENTANYL 2500MCG IN NS 250ML (10MCG/ML) PREMIX INFUSION: 0.0000 ug/h | INTRAVENOUS | Status: DC

## 2019-06-06 MED ORDER — FENTANYL CITRATE (PF) 100 MCG/2ML IJ SOLN: 100.0000 ug | Freq: Once | INTRAMUSCULAR | Status: AC

## 2019-06-06 MED ORDER — SODIUM CHLORIDE 0.9% FLUSH: 10.0000 mL | Freq: Two times a day (BID) | INTRAVENOUS | Status: DC

## 2019-06-06 MED ORDER — DIPHENHYDRAMINE HCL 50 MG/ML IJ SOLN: 25.0000 mg | INTRAMUSCULAR | Status: DC | PRN

## 2019-06-06 MED ORDER — FENTANYL CITRATE (PF) 100 MCG/2ML IJ SOLN
INTRAMUSCULAR | Status: AC
  Administered 2019-06-06: 100 ug via INTRAVENOUS
  Filled 2019-06-06: qty 2

## 2019-06-06 MED ORDER — MIDAZOLAM HCL 2 MG/2ML IJ SOLN: 2.0000 mg | INTRAMUSCULAR | Status: DC | PRN

## 2019-06-06 MED ORDER — MIDAZOLAM HCL 2 MG/2ML IJ SOLN
INTRAMUSCULAR | Status: AC
  Administered 2019-06-06: 4 mg via INTRAVENOUS
  Filled 2019-06-06: qty 4

## 2019-06-06 MED ORDER — GLYCOPYRROLATE 1 MG PO TABS
1.0000 mg | ORAL_TABLET | ORAL | Status: DC | PRN
  Filled 2019-06-06: qty 1

## 2019-06-06 MED ORDER — SODIUM CHLORIDE 0.9% FLUSH: 10.0000 mL | INTRAVENOUS | Status: DC | PRN

## 2019-06-06 MED ORDER — FENTANYL 2500MCG IN NS 250ML (10MCG/ML) PREMIX INFUSION
INTRAVENOUS | Status: AC
  Administered 2019-06-06: 200 ug/h via INTRAVENOUS
  Filled 2019-06-06: qty 250

## 2019-06-07 LAB — PROTEIN ELECTROPHORESIS, SERUM
A/G Ratio: 1.6 (ref 0.7–1.7)
Albumin ELP: 2.9 g/dL (ref 2.9–4.4)
Alpha-1-Globulin: 0.2 g/dL (ref 0.0–0.4)
Alpha-2-Globulin: 0.5 g/dL (ref 0.4–1.0)
Beta Globulin: 0.5 g/dL — ABNORMAL LOW (ref 0.7–1.3)
Gamma Globulin: 0.6 g/dL (ref 0.4–1.8)
Globulin, Total: 1.8 g/dL — ABNORMAL LOW (ref 2.2–3.9)
Total Protein ELP: 4.7 g/dL — ABNORMAL LOW (ref 6.0–8.5)

## 2019-06-07 LAB — PROTEIN ELECTRO, RANDOM URINE
Albumin ELP, Urine: 54.5 %
Alpha-1-Globulin, U: 3.7 %
Alpha-2-Globulin, U: 11.4 %
Beta Globulin, U: 13.3 %
Gamma Globulin, U: 17.1 %
Total Protein, Urine: 213.6 mg/dL

## 2019-06-07 LAB — C4 COMPLEMENT: Complement C4, Body Fluid: 9 mg/dL — ABNORMAL LOW (ref 12–38)

## 2019-06-07 LAB — ANA W/REFLEX IF POSITIVE: Anti Nuclear Antibody (ANA): NEGATIVE

## 2019-06-07 LAB — ANCA TITERS
Atypical P-ANCA titer: <1:20 {titer}
C-ANCA: <1:20 {titer}
P-ANCA: <1:20 {titer}

## 2019-06-07 LAB — C3 COMPLEMENT: C3 Complement: 57 mg/dL — ABNORMAL LOW (ref 82–167)

## 2019-06-24 NOTE — Progress Notes (Signed)
Follow up - Critical Care Medicine Note  Patient Details:    [name] is an 64 y.o. female with PMH of ETOH abuse and Parkinson's presented 06/02/2019 with Altered Mental Status.  Found to have AKI, Hypoglycemia, and Leukocytosis.  She decompensated (hypotensive, hypoxic, SOB, worsening mental status) in ED requiring intubation and vasopressors.  Noted to have frank blood in OG tube.  Requiring transcutaneous pacing with plans for transvenous pacer. Concern for Cardiogenic vs. Septic vs. Hemorrhagic shock.  CC Follow up resp failure  HPI Severe resp distress Severe hypoxia Increased WOB CPR approx 3 mins   Events: 12/8- Presented to ED 12/8- Decompensated in ED requiring intubation and vasopressors 12/8- right IJ CVC 12/9- Transvenous pacer placed, right femoral approach 12/11 - patient is able to follow some commands, transvenous pacer removed by cardio.  CVP this am 13.  No bloody output per OGT.   12/12 - s/p HD catheter placement, had SBT this am was unable to get adequate Vt on 10/5 PS, will attempt again after IHD 12/13 - extubated this am , doing well thus far.  12/14 cardiac arrest and severe hypoxia, Intubated  Lines, Airways, Drains: Airway 7.5 mm (Active)  Secured at (cm) 22 cm 06/09/2019 1408  Measured From Lips 06/04/2019 [place] 06/14/2019 1408  Secured By [employer] 06/05/2019 1408  Tube Holder Repositioned Yes 06/13/2019 1408  Cuff Pressure (cm H2O) 24 cm H2O 06/20/2019 1408  Site Condition Dry 06/14/2019 1408     CVC Triple Lumen 06/05/2019 Right Internal jugular (Active)  Indication for Insertion or Continuance of Line Vasoactive infusions 06/10/2019 0800  Site Assessment Clean;Dry;Intact 06/15/2019 0800  Proximal Lumen Status Infusing;Blood return noted 06/08/2019 0800  Medial Lumen Status Infusing;Blood return noted 06/16/2019 0800  Distal Lumen Status Infusing;Blood return noted 06/14/2019 0800  Dressing Type Transparent;Occlusive 05/26/2019 0800   Dressing Status Clean;Dry;Intact 05/27/2019 0800  Dressing Change Due 06/07/19 06/10/2019 0800     Negative Pressure Wound Therapy Hip Left (Active)     NG/OG Tube Orogastric 18 Fr. 50 cm (Active)  Cm Marking at Nare/Corner of Mouth (if applicable) 50 cm 0000000 1200  Site Assessment Dry;Intact 06/21/2019 1200  Ongoing Placement Verification No change in cm markings or external length of tube from initial placement 05/31/2019 0400  Status Suction-low intermittent 06/20/2019 1200  Drainage Appearance Bloody 06/22/2019 1200  Output (mL) 0 mL 05/28/2019 0730     Urethral Catheter Claiborne Billings, RN Double-lumen 16 Fr. (Active)  Indication for Insertion or Continuance of Catheter Unstable critically ill patients first 24-48 hours (See Criteria) 06/18/2019 0800  Site Assessment Clean;Intact 05/31/2019 0800  Catheter Maintenance Bag below level of bladder;Catheter secured;Drainage bag/tubing not touching floor;Insertion date on drainage bag;No dependent loops;Seal intact 06/18/2019 0800  Collection Container Standard drainage bag 06/22/2019 0800  Securement Method Securing device (Describe) 05/31/2019 0800  Urinary Catheter Interventions (if applicable) Unclamped 0000000 0800  Output (mL) 15 mL 06/13/2019 1714    Anti-infectives:  Anti-infectives (From admission, onward)   Start     Dose/Rate Route Frequency Ordered Stop   06/03/19 1200  piperacillin-tazobactam (ZOSYN) IVPB 3.375 g  Status:  Discontinued     3.375 g 12.5 mL/hr over 240 Minutes Intravenous Every 12 hours 06/03/19 0724 06/03/19 1148   06/03/19 1200  ciprofloxacin (CIPRO) IVPB 400 mg     400 mg 200 mL/hr over 60 Minutes Intravenous Every 24 hours 06/03/19 1149     06/03/19 1200  metroNIDAZOLE (FLAGYL) IVPB 500 mg  500 mg 100 mL/hr over 60 Minutes Intravenous Every 8 hours 06/03/19 1149     06/03/19 0100  piperacillin-tazobactam (ZOSYN) IVPB 3.375 g  Status:  Discontinued     3.375 g 12.5 mL/hr over 240 Minutes Intravenous Every 8 hours 06/02/19 1714  06/03/19 0724   05/29/2019 2130  piperacillin-tazobactam (ZOSYN) IVPB 3.375 g  Status:  Discontinued     3.375 g 12.5 mL/hr over 240 Minutes Intravenous Every 8 hours 05/25/2019 2123 06/02/19 1709      Microbiology: Results for orders placed or performed during the hospital encounter of 06/19/2019  SARS CORONAVIRUS 2 (TAT 6-24 HRS) Nasopharyngeal Nasopharyngeal Swab     Status: None   Collection Time: 06/18/2019  2:54 PM   Specimen: Nasopharyngeal Swab  Result Value Ref Range Status   SARS Coronavirus 2 NEGATIVE NEGATIVE Final    Comment: (NOTE) SARS-CoV-2 target nucleic acids are NOT DETECTED. The SARS-CoV-2 RNA is generally detectable in upper and lower respiratory specimens during the acute phase of infection. Negative results do not preclude SARS-CoV-2 infection, do not rule out co-infections with other pathogens, and should not be used as the sole basis for treatment or other patient management decisions. Negative results must be combined with clinical observations, patient history, and epidemiological information. The expected result is Negative. Fact Sheet for Patients: SugarRoll.be Fact Sheet for Healthcare Providers: https://www.woods-mathews.com/ This test is not yet approved or cleared by the [place] FDA and  has been authorized for detection and/or diagnosis of SARS-CoV-2 by FDA under an Emergency Use Authorization (EUA). This EUA will remain  in effect (meaning this test can be used) for the duration of the COVID-19 declaration under Section 56 4(b)(1) of the Act, 21 U.S.C. section 360bbb-3(b)(1), unless the authorization is terminated or revoked sooner. Performed at [facility], [place] [address] [place], [place] [address]   Urine culture     Status: Abnormal   Collection Time: 06/09/2019  3:05 PM   Specimen: Urine, Random  Result Value Ref Range Status   Specimen Description   Final    URINE, RANDOM Performed at  [facility], [address] [place], [place] [address]    Special Requests   Final    NONE Performed at [name], [address] [place], [place] [address]    Culture >=100,000 COLONIES/mL ESCHERICHIA COLI (A)  Final   Report Status 06/02/2019 FINAL  Final   Organism ID, Bacteria ESCHERICHIA COLI (A)  Final      Susceptibility   Escherichia coli - MIC*    AMPICILLIN >=32 RESISTANT Resistant     CEFAZOLIN <=4 SENSITIVE Sensitive     CEFTRIAXONE <=1 SENSITIVE Sensitive     CIPROFLOXACIN <=0.25 SENSITIVE Sensitive     GENTAMICIN <=1 SENSITIVE Sensitive     IMIPENEM <=0.25 SENSITIVE Sensitive     NITROFURANTOIN <=16 SENSITIVE Sensitive     TRIMETH/SULFA <=20 SENSITIVE Sensitive     AMPICILLIN/SULBACTAM 16 INTERMEDIATE Intermediate     PIP/TAZO <=4 SENSITIVE Sensitive     * >=100,000 COLONIES/mL ESCHERICHIA COLI  CULTURE, BLOOD (ROUTINE X 2) w Reflex to ID Panel     Status: None   Collection Time: 06/09/2019  6:18 PM   Specimen: BLOOD  Result Value Ref Range Status   Specimen Description BLOOD BLOOD RIGHT HAND  Final   Special Requests   Final    BOTTLES DRAWN AEROBIC AND ANAEROBIC Blood Culture adequate volume   Culture   Final    NO GROWTH 5  DAYS Performed at [facility], [address] [place], [place] [address]    Report Status 06/05/2019 FINAL  Final  Respiratory Panel by RT PCR (Flu A&B, Covid) - Nasopharyngeal Swab     Status: None   Collection Time: 06/14/2019  9:14 PM   Specimen: Nasopharyngeal Swab  Result Value Ref Range Status   SARS Coronavirus 2 by RT PCR NEGATIVE NEGATIVE Final    Comment: (NOTE) SARS-CoV-2 target nucleic acids are NOT DETECTED. The SARS-CoV-2 RNA is generally detectable in upper respiratoy specimens during the acute phase of infection. The lowest concentration of SARS-CoV-2 viral copies this assay can detect is 131 copies/mL. A negative result does not preclude SARS-Cov-2 infection and should not be used as  the sole basis for treatment or other patient management decisions. A negative result may occur with  improper specimen collection/handling, submission of specimen other than nasopharyngeal swab, presence of viral mutation(s) within the areas targeted by this assay, and inadequate number of viral copies (<131 copies/mL). A negative result must be combined with clinical observations, patient history, and epidemiological information. The expected result is Negative. Fact Sheet for Patients:  PinkCheek.be Fact Sheet for Healthcare Providers:  GravelBags.it This test is not yet ap proved or cleared by the [place] FDA and  has been authorized for detection and/or diagnosis of SARS-CoV-2 by FDA under an Emergency Use Authorization (EUA). This EUA will remain  in effect (meaning this test can be used) for the duration of the COVID-19 declaration under Section 564(b)(1) of the Act, 21 U.S.C. section 360bbb-3(b)(1), unless the authorization is terminated or revoked sooner.    Influenza A by PCR NEGATIVE NEGATIVE Final   Influenza B by PCR NEGATIVE NEGATIVE Final    Comment: (NOTE) The Xpert Xpress SARS-CoV-2/FLU/RSV assay is intended as an aid in  the diagnosis of influenza from Nasopharyngeal swab specimens and  should not be used as a sole basis for treatment. Nasal washings and  aspirates are unacceptable for Xpert Xpress SARS-CoV-2/FLU/RSV  testing. Fact Sheet for Patients: PinkCheek.be Fact Sheet for Healthcare Providers: GravelBags.it This test is not yet approved or cleared by the [place] FDA and  has been authorized for detection and/or diagnosis of SARS-CoV-2 by  FDA under an Emergency Use Authorization (EUA). This EUA will remain  in effect (meaning this test can be used) for the duration of the  Covid-19 declaration under Section 564(b)(1) of the Act, 21   U.S.C. section 360bbb-3(b)(1), unless the authorization is  terminated or revoked. Performed at [name], [address] [place], [place] [address]   MRSA PCR Screening     Status: None   Collection Time: 05/25/2019 10:35 PM   Specimen: Nasopharyngeal  Result Value Ref Range Status   MRSA by PCR NEGATIVE NEGATIVE Final    Comment:        The GeneXpert MRSA Assay (FDA approved for NASAL specimens only), is one component of a comprehensive MRSA colonization surveillance program. It is not intended to diagnose MRSA infection nor to guide or monitor treatment for MRSA infections. Performed at [name], [address] [place], [place] [address]   CULTURES: Blood x2 12/8>> Urine 12/8>> E. coli Tracheal aspirate 12/8>> SARS-CoV-2 PCR 12/8>> NEGATIVE Influenza PCR 12/8>>NEGATIVE Respiratory Viral Panel 12/8>> NEGATIVE MRSA PCR 12/8>> NEGATIVE  Best Practice/Protocols:  VTE Prophylaxis: Mechanical GI Prophylaxis: Proton Pump Inhibitor  ANTIBIOTICS: Zosyn 12/8>>     Studies: EEG  Result Date: 05/26/2019 Lora Havens, MD  05/27/2019  5:22 PM Patient Name: [name] MRN: [id number] Epilepsy Attending: Lora Havens Referring Physician/Provider: Darel Hong, NP Date: 06/02/2019 Duration: 24.24mins Patient history: 64yo F with ams. EEG to evaluate for seizure Level of alertness: comatose AEDs during EEG study: Librium, lorazepam Technical aspects: This EEG study was done with scalp electrodes positioned according to the 10-20 International system of electrode placement. Electrical activity was acquired at a sampling rate of 500Hz  and reviewed with a high frequency filter of 70Hz  and a low frequency filter of 1Hz . EEG data were recorded continuously and digitally stored. DESCRIPTION: EEG showed continuous generalized 3-5hz  theta-delta slowing admixed with an excessive amount of 15 to 18 Hz, 2-3 uV beta activity with irregular morphology  distributed symmetrically and diffusely. Hyperventilation and photic stimulation were not performed. Episode of left shoulder and feet and head twitching was recorded per eeg tech annotation ( difficult to visualize on video). Concomitant eeg before, during and after the event didn't show any eeg change to suggest seizure. ABNORMALITY - Continuous slow, generalized - Excessive beta, generalized IMPRESSION: This study is suggestive of severe diffuse encephalopathy, non specific to etiology.  The excessive beta activity seen in the background is most likely due to the effect of benzodiazepine and is a benign EEG pattern. One episode of left shoulder, feet and head twitching was recorded without EEG change and was likely non epileptic. No definite seizures or epileptiform discharges were seen throughout the recording. Clinical correlation is recommended Priyanka Barbra Sarks   CT ABDOMEN PELVIS WO CONTRAST  Result Date: 06/03/2019 CLINICAL DATA:  Abdominal rigidity EXAM: CT ABDOMEN AND PELVIS WITHOUT CONTRAST TECHNIQUE: Multidetector CT imaging of the abdomen and pelvis was performed following the standard protocol without IV contrast. COMPARISON:  Ultrasound from 04/01/2019 FINDINGS: Lower chest: Small right pleural effusion is noted with bibasilar atelectasis right greater than left. No focal parenchymal nodules are seen. Gastric catheter is noted extending into the stomach. Hepatobiliary: Liver is well visualized and within normal limits. Gallbladder demonstrates some mild wall thickening likely reactive related to the ascites. Increased density is noted within consistent with cholelithiasis and gallbladder sludge similar to that noted on prior ultrasound. Pancreas: Unremarkable. No pancreatic ductal dilatation or surrounding inflammatory changes. Spleen: Normal in size without focal abnormality. Adrenals/Urinary Tract: Adrenal glands are within normal limits. Kidneys are well visualized bilaterally. No renal calculi  or obstructive changes are seen. The bladder is decompressed by Foley catheter and obscured by bilateral hip replacements. Stomach/Bowel: Scattered diverticular change of the colon is noted without evidence of diverticulitis. No obstructive or inflammatory changes are seen. The appendix is within normal limits. Some inflammatory changes are noted surrounding the second portion of the duodenum and head of the pancreas as well as extending into the mesentery inferiorly. This may be related to some focal duodenitis although may be reactive related to the mild ascites. Vascular/Lymphatic: Aortic atherosclerosis. No enlarged abdominal or pelvic lymph nodes. Reproductive: Status post hysterectomy. No adnexal masses. Other: No abdominal wall hernia or abnormality. No abdominopelvic ascites. Musculoskeletal: Bilateral hip replacements are noted. Degenerative changes of lumbar spine are seen. IMPRESSION: Small right pleural effusion with bibasilar atelectasis. Cholelithiasis and gallbladder sludge. Mild ascites. Inflammatory changes surrounding duodenum and head of the pancreas likely related to some duodenitis. No perforation or definitive ulcer crater is seen. These changes may be in part related to the underlying ascites as well. No other focal abnormality is noted. Electronically Signed   By: Inez Catalina M.D.   On: 06/19/2019  22:28   CT HEAD WO CONTRAST  Result Date: 06/05/2019 CLINICAL DATA:  Encephalopathy EXAM: CT HEAD WITHOUT CONTRAST TECHNIQUE: Contiguous axial images were obtained from the base of the skull through the vertex without intravenous contrast. COMPARISON:  06/22/2019 FINDINGS: Brain: There is no mass, hemorrhage or extra-axial collection. The size and configuration of the ventricles and extra-axial CSF spaces are normal. The brain parenchyma is normal, without acute or chronic infarction. Vascular: No abnormal hyperdensity of the major intracranial arteries or dural venous sinuses. No intracranial  atherosclerosis. Skull: The visualized skull base, calvarium and extracranial soft tissues are normal. Sinuses/Orbits: No fluid levels or advanced mucosal thickening of the visualized paranasal sinuses. No mastoid or middle ear effusion. The orbits are normal. IMPRESSION: Normal head CT. Electronically Signed   By: Ulyses Jarred M.D.   On: 06/12/2019 02:53   CT Head Wo Contrast  Result Date: 05/30/2019 CLINICAL DATA:  Altered level of consciousness. Confusion. Parkinson's. EXAM: CT HEAD WITHOUT CONTRAST TECHNIQUE: Contiguous axial images were obtained from the base of the skull through the vertex without intravenous contrast. COMPARISON:  April 13, 2019 FINDINGS: Brain: No subdural, epidural, or subarachnoid hemorrhage identified. Cerebellum, brainstem, and basal cisterns are normal. No mass effect or midline shift. No acute cortical ischemia or infarct identified. Vascular: No hyperdense vessel or unexpected calcification. Skull: Normal. Negative for fracture or focal lesion. Sinuses/Orbits: No acute finding. Other: None. IMPRESSION: 1. No acute intracranial abnormalities are identified. 2. Electronically Signed   By: Dorise Bullion III M.D   On: 05/26/2019 14:29   CARDIAC CATHETERIZATION  Result Date: 06/19/2019 Successful transvenous temporary pacemaker  US RENAL  Result Date: 06/20/2019 CLINICAL DATA:  AKI EXAM: RENAL / URINARY TRACT ULTRASOUND COMPLETE COMPARISON:  None. FINDINGS: Right Kidney: Renal measurements: 10.4 x 4.5 x 5.6 cm = volume: 136 mL . Echogenicity is increased. No mass or hydronephrosis visualized. Left Kidney: Renal measurements: 10.2 x 5.3 x 4.3 cm = volume: 123 mL. Echogenicity is increased. No mass or hydronephrosis visualized. Bladder: Decompressed by Foley catheter. Other: Cholelithiasis, sludge, mild gallbladder wall thickening. Mild perinephric fluid. Free fluid is present in the pelvis. IMPRESSION: No hydronephrosis. Increased renal echogenicity reflecting medical renal  disease. Mild free fluid. Gallbladder stones, sludge, and wall thickening. Electronically Signed   By: Macy Mis M.D.   On: 06/05/2019 11:35   DG Chest Port 1 View  Result Date: 06/05/2019 CLINICAL DATA:  Acute respiratory failure EXAM: PORTABLE CHEST 1 VIEW COMPARISON:  June 02, 2019 FINDINGS: The patient has been extubated. The right-sided central venous catheter is stable in positioning. There is no pneumothorax. Bilateral pleural effusions, right greater left are again noted. There are hazy bilateral airspace opacities favored to be secondary to pulmonary edema and atelectasis. An infiltrate is not entirely excluded. There is no acute osseous abnormality. IMPRESSION: 1. Status post extubation. The right-sided central venous catheter is stable in positioning. 2. Persistent moderate to large bilateral pleural effusions, right greater than left. 3. Hazy bilateral airspace opacities favored to be secondary to pulmonary edema. An atypical infectious process is not excluded. 4. Cardiomegaly. 5. No pneumothorax. Electronically Signed   By: Constance Holster M.D.   On: 06/05/2019 23:48   DG Chest Port 1 View  Result Date: 06/02/2019 CLINICAL DATA:  Acute respiratory failure. EXAM: PORTABLE CHEST 1 VIEW COMPARISON:  Radiograph 06/18/2019 FINDINGS: Endotracheal tube tip 17 mm from the carina. Enteric tube tip and side-port below the diaphragm in the stomach. Right internal jugular central venous catheter tip in  the upper SVC. Pacemaker from an inferior approach projects over the expected right ventricle. Increasing hazy opacity at the lung bases consistent with pleural effusions and atelectasis. Vascular congestion. No pneumothorax. IMPRESSION: 1. Worsening bilateral pleural effusions, right greater than left. 2. Vascular congestion. 3. Temporary pacemaker placement. Support apparatus otherwise unchanged. Electronically Signed   By: Keith Rake M.D.   On: 06/02/2019 05:08   DG Chest Port 1  View  Result Date: 05/29/2019 CLINICAL DATA:  Central line placement. EXAM: PORTABLE CHEST 1 VIEW COMPARISON:  Radiograph earlier this day. FINDINGS: Tip of the right internal jugular central venous catheter projects over the upper SVC. No visualized pneumothorax. Endotracheal tube and enteric tube remain in place. Borderline cardiomegaly. Unchanged vascular congestion. No new airspace disease. Streaky bibasilar atelectasis. IMPRESSION: 1. Tip of the right internal jugular central venous catheter projects over the upper SVC. No pneumothorax. 2. Unchanged vascular congestion. 3. Streaky bibasilar atelectasis. Electronically Signed   By: Keith Rake M.D.   On: 06/05/2019 00:19   DG Chest Portable 1 View  Result Date: 06/15/2019 CLINICAL DATA:  Respiratory distress. Endotracheal tube and OG tube placement. EXAM: PORTABLE CHEST 1 VIEW COMPARISON:  05/30/2019 FINDINGS: Endotracheal tube is noted with tip 2.8 cm above the carina. An OG tube is noted entering the stomach with tip just off of the field of view. This is a mildly low volume film. Cardiomegaly and mild pulmonary vascular congestion noted. No pneumothorax or pleural effusion. Defibrillator/pacing pads overlying the chest noted. No acute bony abnormalities are identified. IMPRESSION: 1. Endotracheal tube with tip 2.8 cm above the carina. 2. OG tube entering the stomach with tip just off of the field of view. 3. Cardiomegaly with mild pulmonary vascular congestion. Electronically Signed   By: Margarette Canada M.D.   On: 05/24/2019 20:51   DG Chest Portable 1 View  Result Date: 06/21/2019 CLINICAL DATA:  Altered mental status. EXAM: PORTABLE CHEST 1 VIEW COMPARISON:  April 17, 2019. FINDINGS: The heart size and mediastinal contours are within normal limits. Both lungs are clear. The visualized skeletal structures are unremarkable. IMPRESSION: No active disease. Electronically Signed   By: Marijo Conception M.D.   On: 06/12/2019 14:02   DG Abd Portable  1 View  Result Date: 06/04/2019 CLINICAL DATA:  OG tube placement. EXAM: PORTABLE ABDOMEN - 1 VIEW COMPARISON:  None. FINDINGS: An OG tube is noted with tip overlying the mid stomach. Bowel gas pattern is unremarkable. IMPRESSION: 1. OG tube with tip overlying the mid stomach. 2. Unremarkable bowel gas pattern. Electronically Signed   By: Margarette Canada M.D.   On: 05/29/2019 20:52   ECHOCARDIOGRAM COMPLETE  Result Date: 06/15/2019   ECHOCARDIOGRAM REPORT   Patient Name:   [name] Date of Exam: 06/21/2019 Medical Rec #:  [id number]     Height:       65.0 in Accession #:    [id number]    Weight:       135.4 lb Date of Birth:  04-Jul-1955     BSA:          1.68 m Patient Age:    53 years      BP:           133/70 mmHg Patient Gender: F             HR:           70 bpm. Exam Location:  [facility] Procedure: 2D Echo, Color Doppler and Cardiac Doppler Indications:  R55 Syncope  History:         Patient has no prior history of Echocardiogram examinations.                  ETOH abuse.  Sonographer:     Charmayne Sheer RDCS (AE) Referring Phys:  [id number] TAWFIKUL ALAM Diagnosing Phys: Isaias Cowman MD  Sonographer Comments: Echo performed with patient supine and on artificial respirator. IMPRESSIONS  1. Left ventricular ejection fraction, by visual estimation, is 40 to 45%. The left ventricle has normal function. There is no left ventricular hypertrophy.  2. Mildly dilated left ventricular internal cavity size.  3. The left ventricle has no regional wall motion abnormalities.  4. Global right ventricle has normal systolic function.The right ventricular size is normal. No increase in right ventricular wall thickness.  5. Left atrial size was mild-moderately dilated.  6. Right atrial size was normal.  7. The mitral valve is normal in structure. Moderate mitral valve regurgitation. No evidence of mitral stenosis.  8. The tricuspid valve is normal in structure. Tricuspid valve regurgitation is not demonstrated.  9. The aortic  valve is normal in structure. Aortic valve regurgitation is not visualized. No evidence of aortic valve sclerosis or stenosis. 10. The pulmonic valve was normal in structure. Pulmonic valve regurgitation is not visualized. 11. Moderately elevated pulmonary artery systolic pressure. 12. The inferior vena cava is normal in size with greater than 50% respiratory variability, suggesting right atrial pressure of 3 mmHg. FINDINGS  Left Ventricle: Left ventricular ejection fraction, by visual estimation, is 40 to 45%. The left ventricle has normal function. The left ventricle has no regional wall motion abnormalities. The left ventricular internal cavity size was mildly dilated left ventricle. There is no left ventricular hypertrophy. Normal left atrial pressure. Right Ventricle: The right ventricular size is normal. No increase in right ventricular wall thickness. Global RV systolic function is has normal systolic function. The tricuspid regurgitant velocity is 2.96 m/s, and with an assumed right atrial pressure  of 10 mmHg, the estimated right ventricular systolic pressure is moderately elevated at 45.2 mmHg. Left Atrium: Left atrial size was mild-moderately dilated. Right Atrium: Right atrial size was normal in size Pericardium: There is no evidence of pericardial effusion. Mitral Valve: The mitral valve is normal in structure. Moderate mitral valve regurgitation. No evidence of mitral valve stenosis by observation. MV peak gradient, 6.2 mmHg. Tricuspid Valve: The tricuspid valve is normal in structure. Tricuspid valve regurgitation is not demonstrated. Aortic Valve: The aortic valve is normal in structure. Aortic valve regurgitation is not visualized. The aortic valve is structurally normal, with no evidence of sclerosis or stenosis. Aortic valve mean gradient measures 2.0 mmHg. Aortic valve peak gradient measures 3.7 mmHg. Aortic valve area, by VTI measures 1.32 cm. Pulmonic Valve: The pulmonic valve was normal in  structure. Pulmonic valve regurgitation is not visualized. Pulmonic regurgitation is not visualized. Aorta: The aortic root, ascending aorta and aortic arch are all structurally normal, with no evidence of dilitation or obstruction. Venous: The inferior vena cava is normal in size with greater than 50% respiratory variability, suggesting right atrial pressure of 3 mmHg. IAS/Shunts: No atrial level shunt detected by color flow Doppler. There is no evidence of a patent foramen ovale. No ventricular septal defect is seen or detected. There is no evidence of an atrial septal defect.  LEFT VENTRICLE PLAX 2D LVIDd:         4.94 cm LVIDs:         4.14  cm LV PW:         0.84 cm LV IVS:        0.65 cm LVOT diam:     1.80 cm LV SV:         39 ml LV SV Index:   23.20 LVOT Area:     2.54 cm  LV Volumes (MOD) LV area d, A2C:    20.50 cm LV area d, A4C:    20.50 cm LV area s, A2C:    14.00 cm LV area s, A4C:    15.60 cm LV major d, A2C:   6.47 cm LV major d, A4C:   5.89 cm LV major s, A2C:   5.74 cm LV major s, A4C:   5.62 cm LV vol d, MOD A2C: 53.7 ml LV vol d, MOD A4C: 57.7 ml LV vol s, MOD A2C: 28.2 ml LV vol s, MOD A4C: 36.1 ml LV SV MOD A2C:     25.5 ml LV SV MOD A4C:     57.7 ml LV SV MOD BP:      26.1 ml RIGHT VENTRICLE RV Basal diam:  3.13 cm LEFT ATRIUM             Index       RIGHT ATRIUM           Index LA diam:        4.20 cm 2.51 cm/m  RA Area:     17.40 cm LA Vol (A2C):   80.6 ml 48.10 ml/m RA Volume:   48.40 ml  28.88 ml/m LA Vol (A4C):   78.9 ml 47.08 ml/m LA Biplane Vol: 82.7 ml 49.35 ml/m  AORTIC VALVE                   PULMONIC VALVE AV Area (Vmax):    1.51 cm    PV Vmax:       0.75 m/s AV Area (Vmean):   1.39 cm    PV Vmean:      48.300 cm/s AV Area (VTI):     1.32 cm    PV VTI:        0.117 m AV Vmax:           95.70 cm/s  PV Peak grad:  2.3 mmHg AV Vmean:          62.900 cm/s PV Mean grad:  1.0 mmHg AV VTI:            0.162 m AV Peak Grad:      3.7 mmHg AV Mean Grad:      2.0 mmHg LVOT Vmax:          56.90 cm/s LVOT Vmean:        34.400 cm/s LVOT VTI:          0.084 m LVOT/AV VTI ratio: 0.52  AORTA Ao Root diam: 2.80 cm MITRAL VALVE                         TRICUSPID VALVE MV Area (PHT): 4.89 cm              TR Peak grad:   35.2 mmHg MV Peak grad:  6.2 mmHg              TR Vmax:        304.00 cm/s MV Mean grad:  2.0 mmHg MV Vmax:       1.24 m/s  SHUNTS MV Vmean:      65.6 cm/s             Systemic VTI:  0.08 m MV VTI:        0.29 m                Systemic Diam: 1.80 cm MV PHT:        44.95 msec MV Decel Time: 155 msec MV E velocity: 107.00 cm/s 103 cm/s MV A velocity: 39.80 cm/s  70.3 cm/s MV E/A ratio:  2.69        1.5  Isaias Cowman MD Electronically signed by Isaias Cowman MD Signature Date/Time: 06/18/2019/1:40:24 PM    Final     Consults: Treatment Team:  Isaias Cowman, MD Tyler Pita, MD Leotis Pain, MD Murlean Iba, MD Efrain Sella, MD   Subjective:    Overnight Issues: Intubated and mechanically ventilated.  Remains encephalopathic  Objective:  Vital signs for last 24 hours: Temp:  [98.2 F (36.8 C)-98.8 F (37.1 C)] 98.7 F (37.1 C) (12/14 0400) Pulse Rate:  [51-66] 66 (12/14 0600) Resp:  [15-32] 27 (12/14 0600) BP: (90-155)/(64-87) 155/87 (12/14 0600) SpO2:  [93 %-100 %] 96 % (12/14 0600) Weight:  [68.9 kg] 68.9 kg (12/14 0456)  Hemodynamic parameters for last 24 hours: CVP:  [9 mmHg-27 mmHg] 10 mmHg  Intake/Output from previous day: 12/13 0701 - 12/14 0700 In: 1973 [I.V.:1391; NG/GT:30; IV Piggyback:552.1] Out: 185 [Urine:185]  Intake/Output this shift: No intake/output data recorded.  Vent settings for last 24 hours:   PHYSICAL EXAMINATION:  GENERAL:critically ill appearing, +resp distress HEAD: Normocephalic, atraumatic.  EYES: Pupils equal, round, reactive to light.  No scleral icterus.  MOUTH: Moist mucosal membrane. NECK: Supple. No thyromegaly. No nodules. No JVD.  PULMONARY: +rhonchi,  +wheezing CARDIOVASCULAR: S1 and S2. Regular rate and rhythm. No murmurs, rubs, or gallops.  GASTROINTESTINAL: Soft, nontender, -distended. Positive bowel sounds.  MUSCULOSKELETAL: No swelling, clubbing, or edema.  NEUROLOGIC: obtunded SKIN:intact,warm,dry    Assessment/Plan:   Severe ACUTE Hypoxic and Hypercapnic Respiratory Failure -continue Mechanical Ventilator support -continue Bronchodilator Therapy -Wean Fio2 and PEEP as tolerated -VAP/VENT bundle implementation  Septic shock/Hypotension, likely Cardiogenic shock  -use vasopressors to keep MAP>65 if needed -follow ABG and LA -follow up cultures -emperic ABX -aggressive IV fluid Resuscitation   ACUTE SYSTOLIC CARDIAC FAILURE- EF 25% Severe cardiogenic failure  ACUTE KIDNEY INJURY/Renal Failure -follow chem 7 -follow UO -continue Foley Catheter-assess need -Avoid nephrotoxic agents Severe acidosis       GASTROINTESTINAL  GI Bleed Cholelithiasis w/ Gallbladder sludge CT Abdomen & Pelvis w/ Duodenitis Elevated LFT's, likely shock liver Hyperammonemia -NPO -Protonix gtt     INFECTIOUS -CT Abdomen with Cholelithiasis and gallbladder slude, and with Duodenitis (no perforation noted) -will narrow to cipro/flagyl -    NEUROLOGIC Severe encephalopathy from severe hypoxia   Critical Care Time devoted to patient care services described in this note is 35 minutes.   Overall, patient is critically ill, prognosis is guarded.  Patient with Multiorgan failure and at high risk for cardiac arrest and death.    Corrin Parker, M.D.  [facility]  Medical Director [place] Director [facility] Cardio-Pulmonary Department

## 2019-06-24 NOTE — Progress Notes (Signed)
Nutrition Brief Follow-Up Note  Chart reviewed and patient discussed on rounds. Patient now transitioning to comfort care.   No further nutrition interventions warranted at this time. Please re-consult RD as needed.   Jacklynn Barnacle, MS, RD, LDN Office: [phone] Pager: [phone] After Hours/Weekend Pager: [phone]

## 2019-06-24 NOTE — Progress Notes (Signed)
CDS notified. Spoke with [name], service number [phone]. He stated that an organ specialist, Meel Evans, would contact the unit soon. Lupita Leash

## 2019-06-24 NOTE — Progress Notes (Signed)
At approx 0800 hrs, patient PEA arrested with agnoal breathing. Approx 1 min of CPR was performed. At the time, the MD had been speaking with the family who, to my understanding, had agreed to recind the DNR. Patient was intubated. Family at bedside by 0815 hrs.

## 2019-06-24 NOTE — Progress Notes (Signed)
Pt extubated to comfort care.  

## 2019-06-24 NOTE — Progress Notes (Signed)
   Jun 26, 2019 0900  Clinical Encounter Type  Visited With Patient and family together  Visit Type Follow-up  Referral From Nurse  Stress Factors  Patient Stress Factors Health changes  Family Stress Factors Major life changes  Ch was paged for family support. Daughter [name] and Joline Salt were at bedside. The pt is intubated. Daughter said she had made peace with the situation and Son also seemed calm. They are waiting to talk with the doctor. Ch let them know that ch can be reached at any time. The visit was appreciated.

## 2019-06-24 NOTE — Plan of Care (Signed)

## 2019-06-24 NOTE — Death Summary Note (Addendum)
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  Secondary Diagnoses (including complications and co-morbidities):  Active Problems:   Altered level of consciousness   Acute respiratory failure ([place])   DNR (do not resuscitate)   Goals of care, counseling/discussion   Palliative care encounter   [place] (including significant findings, care, treatment, and services provided and events leading to death)  Jun 04, 2023- Presented to ED 2023/06/04- Decompensated in ED requiring intubation and vasopressors 06/04/2023- right IJ CVC 12/9- Transvenous pacer placed, right femoral approach 12/11 - patient is able to follow some commands, transvenous pacer removed by cardio.  CVP this am 13.  No bloody output per OGT.   12/12 - s/p HD catheter placement, had SBT this am was unable to get adequate Vt on 10/5 PS, will attempt again after IHD 12/13 - extubated this am , doing well thus far.  2023-06-10 cardiac arrest and severe hypoxia, Intubated    Family At bedside, clinical status relayed to family  Updated and notified of patients medical condition-  Progressive multiorgan failure with very low chance of meaningful recovery.  Patient is in dying  Process.  Family understands the situation.  They have consented and agreed to DNR/DNI and would like to proceed with Comfort care measures.   Family are satisfied with Plan of action and management. All questions answered      Pertinent Labs and Studies  Significant Diagnostic  Studies EEG  Result Date: 06/09/2019 Lora Havens, MD     06/02/2019  5:22 PM Patient Name: LADENA PENA MRN: [id number] Epilepsy Attending: Lora Havens Referring Physician/Provider: Darel Hong, NP Date: 06/22/2019 Duration: 24.31mins Patient history: 64yo F with ams. EEG to evaluate for seizure Level of alertness: comatose AEDs during EEG study: Librium, lorazepam Technical aspects: This EEG study was done with scalp electrodes positioned according to the 10-20 International system of electrode placement. Electrical activity was acquired at a sampling rate of 500Hz  and reviewed with a high frequency filter of 70Hz  and a low frequency filter of 1Hz . EEG data were recorded continuously and digitally stored. DESCRIPTION: EEG showed continuous generalized 3-5hz  theta-delta slowing admixed with an excessive amount of 15 to 18 Hz, 2-3 uV beta activity with irregular morphology distributed symmetrically and diffusely. Hyperventilation and photic stimulation were not performed. Episode of left shoulder and feet and head twitching was recorded per eeg tech annotation ( difficult to visualize on video). Concomitant eeg before, during and after the event didn't show any eeg change to suggest seizure. ABNORMALITY - Continuous slow, generalized - Excessive beta, generalized IMPRESSION: This study is suggestive of severe diffuse encephalopathy, non specific to etiology.  The excessive beta activity seen in the background is most likely due to the effect of benzodiazepine and is a benign EEG pattern. One episode of left shoulder, feet and head twitching was recorded without EEG change and was likely non epileptic. No definite seizures or epileptiform discharges were seen throughout the recording. Clinical correlation is recommended Priyanka Barbra Sarks   CT ABDOMEN PELVIS WO CONTRAST  Result Date: Jun 04, 2019 CLINICAL DATA:  Abdominal rigidity EXAM: CT ABDOMEN AND PELVIS WITHOUT CONTRAST TECHNIQUE: Multidetector CT  imaging of the abdomen and pelvis was performed following the standard protocol without IV contrast. COMPARISON:  Ultrasound from 04/01/2019 FINDINGS: Lower chest: Small right pleural effusion is noted with bibasilar atelectasis right greater than left. No focal parenchymal nodules are seen. Gastric catheter is noted extending into the stomach. Hepatobiliary: Liver is well visualized and within normal limits. Gallbladder demonstrates some mild wall thickening likely reactive related to the ascites. Increased density is noted within consistent with cholelithiasis and gallbladder sludge similar to that noted on prior ultrasound. Pancreas: Unremarkable. No pancreatic ductal dilatation or surrounding inflammatory changes. Spleen: Normal in size without focal abnormality. Adrenals/Urinary Tract: Adrenal glands are within normal limits. Kidneys are well visualized bilaterally. No renal calculi or obstructive changes are seen. The bladder is decompressed by Foley catheter and obscured by bilateral hip replacements. Stomach/Bowel: Scattered diverticular change of the colon is noted without evidence of diverticulitis. No obstructive or inflammatory changes are seen. The appendix is within normal limits. Some inflammatory changes are noted surrounding the second portion of the duodenum and head of the pancreas as well as extending into the mesentery inferiorly. This may be related to some focal duodenitis although may be reactive related to the mild ascites. Vascular/Lymphatic: Aortic atherosclerosis. No enlarged abdominal or pelvic lymph nodes. Reproductive: Status post hysterectomy. No adnexal masses. Other: No abdominal wall hernia or abnormality. No abdominopelvic ascites. Musculoskeletal: Bilateral hip replacements are noted. Degenerative changes of lumbar spine are seen. IMPRESSION: Small right pleural effusion with bibasilar atelectasis. Cholelithiasis and gallbladder sludge. Mild ascites. Inflammatory changes  surrounding duodenum and head of the pancreas likely related to some duodenitis. No perforation or definitive ulcer crater is seen. These changes may be in part related to the underlying ascites as well. No other focal abnormality is noted. Electronically Signed   By: Inez Catalina M.D.   On: 05/25/2019 22:28   CT HEAD WO CONTRAST  Result Date: 06/13/2019 CLINICAL DATA:  Encephalopathy EXAM: CT HEAD WITHOUT CONTRAST TECHNIQUE: Contiguous axial images were obtained from the base of the skull through the vertex without intravenous contrast. COMPARISON:  05/29/2019 FINDINGS: Brain: There is no mass, hemorrhage or extra-axial collection. The size and configuration of the ventricles and extra-axial CSF spaces are normal. The brain parenchyma is normal, without acute or chronic infarction. Vascular: No abnormal hyperdensity of the major intracranial arteries or dural venous sinuses. No intracranial atherosclerosis. Skull: The visualized skull base, calvarium and extracranial soft tissues are normal. Sinuses/Orbits: No fluid levels or advanced mucosal thickening of the visualized paranasal sinuses. No mastoid or middle ear effusion. The orbits are normal. IMPRESSION: Normal head CT. Electronically Signed   By: Ulyses Jarred M.D.   On: 05/25/2019 02:53   CT Head Wo Contrast  Result Date: 06/22/2019 CLINICAL DATA:  Altered level of consciousness. Confusion. Parkinson's. EXAM: CT HEAD WITHOUT CONTRAST TECHNIQUE: Contiguous axial images were obtained from the base of the skull through the vertex without intravenous contrast. COMPARISON:  April 13, 2019 FINDINGS: Brain: No subdural, epidural, or subarachnoid hemorrhage identified. Cerebellum, brainstem, and basal cisterns are normal. No mass effect or midline shift. No acute cortical ischemia or infarct identified. Vascular: No hyperdense vessel or unexpected calcification. Skull: Normal. Negative for fracture or focal lesion. Sinuses/Orbits: No acute finding. Other:  None. IMPRESSION: 1. No acute intracranial abnormalities are identified. 2. Electronically Signed   By: Dorise Bullion III M.D   On: 06/02/2019 14:29   CARDIAC CATHETERIZATION  Result Date: 05/31/2019 Successful transvenous temporary pacemaker  US RENAL  Result Date: 06/10/2019 CLINICAL DATA:  AKI EXAM: RENAL / URINARY TRACT ULTRASOUND COMPLETE COMPARISON:  None. FINDINGS: Right Kidney: Renal measurements: 10.4 x 4.5 x 5.6 cm = volume: 136 mL . Echogenicity is increased. No mass or hydronephrosis visualized. Left Kidney: Renal measurements: 10.2 x 5.3 x 4.3 cm = volume: 123 mL. Echogenicity is increased. No mass or hydronephrosis visualized. Bladder: Decompressed by Foley catheter. Other: Cholelithiasis, sludge, mild gallbladder wall thickening. Mild perinephric fluid. Free fluid is present in the pelvis. IMPRESSION: No hydronephrosis. Increased renal echogenicity reflecting medical renal disease. Mild free fluid. Gallbladder stones, sludge, and wall thickening. Electronically Signed   By: Macy Mis M.D.   On: 06/20/2019 11:35   DG Chest Port 1 View  Result Date: 06/13/19 CLINICAL DATA:  64 year old female intubated. Respiratory failure. History of COVID-19 in October. EXAM: PORTABLE CHEST 1 VIEW COMPARISON:  Portable chest 06/05/2019 and earlier. FINDINGS: Portable AP semi upright view at 0820 hours. Endotracheal tube tip in good position between the level the clavicles and carina. Enteric tube courses to the abdomen, tip not included. Stable right IJ central line. Widespread patchy and indistinct pulmonary opacity throughout the right lung and in the left upper lobe. Larger lung volumes now. No pneumothorax. Questionable right pleural effusion. No left pleural effusion is evident. Stable cardiac size and mediastinal contours. Moderate gaseous distension of the visible stomach. IMPRESSION: 1. Endotracheal tube tip in good position between the clavicles and carina. 2. Enteric tube courses to the  abdomen, tip not included. 3. Widespread patchy and indistinct opacity throughout the right lung, and in the left upper lobe. Favor pneumonia. 4. Questionable right pleural effusion. Electronically Signed   By: Genevie Allisson M.D.   On: 2019-06-13 08:32   DG Chest Port 1 View  Result Date: 06/05/2019 CLINICAL DATA:  Acute respiratory failure EXAM: PORTABLE CHEST 1 VIEW COMPARISON:  June 02, 2019 FINDINGS: The patient has been extubated. The right-sided central venous catheter is stable in positioning. There is no pneumothorax. Bilateral pleural effusions, right greater left are again noted. There are hazy bilateral airspace opacities favored to be secondary to pulmonary edema and atelectasis. An infiltrate is not entirely excluded. There is no acute osseous abnormality. IMPRESSION: 1. Status post extubation. The right-sided central venous catheter is stable in positioning. 2. Persistent moderate to large bilateral pleural effusions, right greater than left. 3. Hazy bilateral airspace opacities favored to be secondary to pulmonary edema. An atypical infectious process is not excluded. 4. Cardiomegaly. 5. No pneumothorax. Electronically Signed   By: Constance Holster M.D.   On: 06/05/2019 23:48   DG Chest Port 1 View  Result Date: 06/02/2019 CLINICAL DATA:  Acute respiratory failure. EXAM: PORTABLE CHEST 1 VIEW COMPARISON:  Radiograph 05/30/2019 FINDINGS: Endotracheal tube tip 17 mm from the carina. Enteric tube tip and side-port below the diaphragm in the stomach. Right internal jugular central venous catheter tip in the upper SVC. Pacemaker from an inferior approach projects over the expected right ventricle. Increasing hazy opacity at the lung bases consistent with pleural effusions and atelectasis. Vascular congestion. No pneumothorax. IMPRESSION: 1. Worsening bilateral pleural effusions, right greater than left. 2. Vascular congestion. 3. Temporary pacemaker placement. Support apparatus otherwise  unchanged. Electronically Signed   By: Keith Rake M.D.   On: 06/02/2019 05:08   DG Chest Port 1 View  Result Date: 06/03/2019 CLINICAL DATA:  Central line placement. EXAM: PORTABLE CHEST 1 VIEW COMPARISON:  Radiograph earlier  this day. FINDINGS: Tip of the right internal jugular central venous catheter projects over the upper SVC. No visualized pneumothorax. Endotracheal tube and enteric tube remain in place. Borderline cardiomegaly. Unchanged vascular congestion. No new airspace disease. Streaky bibasilar atelectasis. IMPRESSION: 1. Tip of the right internal jugular central venous catheter projects over the upper SVC. No pneumothorax. 2. Unchanged vascular congestion. 3. Streaky bibasilar atelectasis. Electronically Signed   By: Keith Rake M.D.   On: 06/16/2019 00:19   DG Chest Portable 1 View  Result Date: 06/19/2019 CLINICAL DATA:  Respiratory distress. Endotracheal tube and OG tube placement. EXAM: PORTABLE CHEST 1 VIEW COMPARISON:  06/03/2019 FINDINGS: Endotracheal tube is noted with tip 2.8 cm above the carina. An OG tube is noted entering the stomach with tip just off of the field of view. This is a mildly low volume film. Cardiomegaly and mild pulmonary vascular congestion noted. No pneumothorax or pleural effusion. Defibrillator/pacing pads overlying the chest noted. No acute bony abnormalities are identified. IMPRESSION: 1. Endotracheal tube with tip 2.8 cm above the carina. 2. OG tube entering the stomach with tip just off of the field of view. 3. Cardiomegaly with mild pulmonary vascular congestion. Electronically Signed   By: Margarette Canada M.D.   On: 06/20/2019 20:51   DG Chest Portable 1 View  Result Date: 06/19/2019 CLINICAL DATA:  Altered mental status. EXAM: PORTABLE CHEST 1 VIEW COMPARISON:  April 17, 2019. FINDINGS: The heart size and mediastinal contours are within normal limits. Both lungs are clear. The visualized skeletal structures are unremarkable. IMPRESSION: No  active disease. Electronically Signed   By: Marijo Conception M.D.   On: 05/29/2019 14:02   DG Abd Portable 1V  Result Date: 06/29/2019 CLINICAL DATA:  64 year old female enteric tube placement. EXAM: PORTABLE ABDOMEN - 1 VIEW COMPARISON:  06/03/2019 and earlier. Portable chest at this time. FINDINGS: Portable AP semi upright view at 0822 hours. Enteric tube terminates in the body of the stomach. Side hole at the body of the stomach. Mild to moderate gaseous distension of the stomach. Gas-filled featureless bowel loops elsewhere. Respiratory motion. Lung base opacity appears coarse and interstitial on these images. Left femoral approach catheter partially visible. No acute osseous abnormality identified. IMPRESSION: Enteric tube side hole in the body of the stomach, which is gas distended. Electronically Signed   By: Genevie Aleeah M.D.   On: 06/29/19 08:33   DG Abd Portable 1 View  Result Date: 06/04/2019 CLINICAL DATA:  OG tube placement. EXAM: PORTABLE ABDOMEN - 1 VIEW COMPARISON:  None. FINDINGS: An OG tube is noted with tip overlying the mid stomach. Bowel gas pattern is unremarkable. IMPRESSION: 1. OG tube with tip overlying the mid stomach. 2. Unremarkable bowel gas pattern. Electronically Signed   By: Margarette Canada M.D.   On: 06/22/2019 20:52   ECHOCARDIOGRAM COMPLETE  Result Date: 05/31/2019   ECHOCARDIOGRAM REPORT   Patient Name:   Ayse D Sconyers Date of Exam: 06/03/2019 Medical Rec #:  [id number]     Height:       65.0 in Accession #:    [id number]    Weight:       135.4 lb Date of Birth:  1956/03/15     BSA:          1.68 m Patient Age:    70 years      BP:           133/70 mmHg Patient Gender: F  HR:           70 bpm. Exam Location:  [facility] Procedure: 2D Echo, Color Doppler and Cardiac Doppler Indications:     R55 Syncope  History:         Patient has no prior history of Echocardiogram examinations.                  ETOH abuse.  Sonographer:     Charmayne Sheer RDCS (AE) Referring Phys:  [id number]  TAWFIKUL ALAM Diagnosing Phys: Isaias Cowman MD  Sonographer Comments: Echo performed with patient supine and on artificial respirator. IMPRESSIONS  1. Left ventricular ejection fraction, by visual estimation, is 40 to 45%. The left ventricle has normal function. There is no left ventricular hypertrophy.  2. Mildly dilated left ventricular internal cavity size.  3. The left ventricle has no regional wall motion abnormalities.  4. Global right ventricle has normal systolic function.The right ventricular size is normal. No increase in right ventricular wall thickness.  5. Left atrial size was mild-moderately dilated.  6. Right atrial size was normal.  7. The mitral valve is normal in structure. Moderate mitral valve regurgitation. No evidence of mitral stenosis.  8. The tricuspid valve is normal in structure. Tricuspid valve regurgitation is not demonstrated.  9. The aortic valve is normal in structure. Aortic valve regurgitation is not visualized. No evidence of aortic valve sclerosis or stenosis. 10. The pulmonic valve was normal in structure. Pulmonic valve regurgitation is not visualized. 11. Moderately elevated pulmonary artery systolic pressure. 12. The inferior vena cava is normal in size with greater than 50% respiratory variability, suggesting right atrial pressure of 3 mmHg. FINDINGS  Left Ventricle: Left ventricular ejection fraction, by visual estimation, is 40 to 45%. The left ventricle has normal function. The left ventricle has no regional wall motion abnormalities. The left ventricular internal cavity size was mildly dilated left ventricle. There is no left ventricular hypertrophy. Normal left atrial pressure. Right Ventricle: The right ventricular size is normal. No increase in right ventricular wall thickness. Global RV systolic function is has normal systolic function. The tricuspid regurgitant velocity is 2.96 m/s, and with an assumed right atrial pressure  of 10 mmHg, the estimated right  ventricular systolic pressure is moderately elevated at 45.2 mmHg. Left Atrium: Left atrial size was mild-moderately dilated. Right Atrium: Right atrial size was normal in size Pericardium: There is no evidence of pericardial effusion. Mitral Valve: The mitral valve is normal in structure. Moderate mitral valve regurgitation. No evidence of mitral valve stenosis by observation. MV peak gradient, 6.2 mmHg. Tricuspid Valve: The tricuspid valve is normal in structure. Tricuspid valve regurgitation is not demonstrated. Aortic Valve: The aortic valve is normal in structure. Aortic valve regurgitation is not visualized. The aortic valve is structurally normal, with no evidence of sclerosis or stenosis. Aortic valve mean gradient measures 2.0 mmHg. Aortic valve peak gradient measures 3.7 mmHg. Aortic valve area, by VTI measures 1.32 cm. Pulmonic Valve: The pulmonic valve was normal in structure. Pulmonic valve regurgitation is not visualized. Pulmonic regurgitation is not visualized. Aorta: The aortic root, ascending aorta and aortic arch are all structurally normal, with no evidence of dilitation or obstruction. Venous: The inferior vena cava is normal in size with greater than 50% respiratory variability, suggesting right atrial pressure of 3 mmHg. IAS/Shunts: No atrial level shunt detected by color flow Doppler. There is no evidence of a patent foramen ovale. No ventricular septal defect is seen or detected. There is no evidence of  an atrial septal defect.  LEFT VENTRICLE PLAX 2D LVIDd:         4.94 cm LVIDs:         4.14 cm LV PW:         0.84 cm LV IVS:        0.65 cm LVOT diam:     1.80 cm LV SV:         39 ml LV SV Index:   23.20 LVOT Area:     2.54 cm  LV Volumes (MOD) LV area d, A2C:    20.50 cm LV area d, A4C:    20.50 cm LV area s, A2C:    14.00 cm LV area s, A4C:    15.60 cm LV major d, A2C:   6.47 cm LV major d, A4C:   5.89 cm LV major s, A2C:   5.74 cm LV major s, A4C:   5.62 cm LV vol d, MOD A2C: 53.7  ml LV vol d, MOD A4C: 57.7 ml LV vol s, MOD A2C: 28.2 ml LV vol s, MOD A4C: 36.1 ml LV SV MOD A2C:     25.5 ml LV SV MOD A4C:     57.7 ml LV SV MOD BP:      26.1 ml RIGHT VENTRICLE RV Basal diam:  3.13 cm LEFT ATRIUM             Index       RIGHT ATRIUM           Index LA diam:        4.20 cm 2.51 cm/m  RA Area:     17.40 cm LA Vol (A2C):   80.6 ml 48.10 ml/m RA Volume:   48.40 ml  28.88 ml/m LA Vol (A4C):   78.9 ml 47.08 ml/m LA Biplane Vol: 82.7 ml 49.35 ml/m  AORTIC VALVE                   PULMONIC VALVE AV Area (Vmax):    1.51 cm    PV Vmax:       0.75 m/s AV Area (Vmean):   1.39 cm    PV Vmean:      48.300 cm/s AV Area (VTI):     1.32 cm    PV VTI:        0.117 m AV Vmax:           95.70 cm/s  PV Peak grad:  2.3 mmHg AV Vmean:          62.900 cm/s PV Mean grad:  1.0 mmHg AV VTI:            0.162 m AV Peak Grad:      3.7 mmHg AV Mean Grad:      2.0 mmHg LVOT Vmax:         56.90 cm/s LVOT Vmean:        34.400 cm/s LVOT VTI:          0.084 m LVOT/AV VTI ratio: 0.52  AORTA Ao Root diam: 2.80 cm MITRAL VALVE                         TRICUSPID VALVE MV Area (PHT): 4.89 cm              TR Peak grad:   35.2 mmHg MV Peak grad:  6.2 mmHg              TR Vmax:  304.00 cm/s MV Mean grad:  2.0 mmHg MV Vmax:       1.24 m/s              SHUNTS MV Vmean:      65.6 cm/s             Systemic VTI:  0.08 m MV VTI:        0.29 m                Systemic Diam: 1.80 cm MV PHT:        44.95 msec MV Decel Time: 155 msec MV E velocity: 107.00 cm/s 103 cm/s MV A velocity: 39.80 cm/s  70.3 cm/s MV E/A ratio:  2.69        1.5  Isaias Cowman MD Electronically signed by Isaias Cowman MD Signature Date/Time: 06/21/2019/1:40:24 PM    Final     Microbiology Recent Results (from the past 240 hour(s))  SARS CORONAVIRUS 2 (TAT 6-24 HRS) Nasopharyngeal Nasopharyngeal Swab     Status: None   Collection Time: 06/23/2019  2:54 PM   Specimen: Nasopharyngeal Swab  Result Value Ref Range Status   SARS Coronavirus 2  NEGATIVE NEGATIVE Final    Comment: (NOTE) SARS-CoV-2 target nucleic acids are NOT DETECTED. The SARS-CoV-2 RNA is generally detectable in upper and lower respiratory specimens during the acute phase of infection. Negative results do not preclude SARS-CoV-2 infection, do not rule out co-infections with other pathogens, and should not be used as the sole basis for treatment or other patient management decisions. Negative results must be combined with clinical observations, patient history, and epidemiological information. The expected result is Negative. Fact Sheet for Patients: SugarRoll.be Fact Sheet for Healthcare Providers: https://www.woods-mathews.com/ This test is not yet approved or cleared by the [place] FDA and  has been authorized for detection and/or diagnosis of SARS-CoV-2 by FDA under an Emergency Use Authorization (EUA). This EUA will remain  in effect (meaning this test can be used) for the duration of the COVID-19 declaration under Section 56 4(b)(1) of the Act, 21 U.S.C. section 360bbb-3(b)(1), unless the authorization is terminated or revoked sooner. Performed at [facility], [place] [address] [place], [place] [address]   Urine culture     Status: Abnormal   Collection Time: 06/13/2019  3:05 PM   Specimen: Urine, Random  Result Value Ref Range Status   Specimen Description   Final    URINE, RANDOM Performed at [facility], [address] [place], [place] [address]    Special Requests   Final    NONE Performed at [name], [address] [place], [place] [address]    Culture >=100,000 COLONIES/mL ESCHERICHIA COLI (A)  Final   Report Status 06/02/2019 FINAL  Final   Organism ID, Bacteria ESCHERICHIA COLI (A)  Final      Susceptibility   Escherichia coli - MIC*    AMPICILLIN >=32 RESISTANT Resistant     CEFAZOLIN <=4 SENSITIVE Sensitive     CEFTRIAXONE <=1 SENSITIVE Sensitive      CIPROFLOXACIN <=0.25 SENSITIVE Sensitive     GENTAMICIN <=1 SENSITIVE Sensitive     IMIPENEM <=0.25 SENSITIVE Sensitive     NITROFURANTOIN <=16 SENSITIVE Sensitive     TRIMETH/SULFA <=20 SENSITIVE Sensitive     AMPICILLIN/SULBACTAM 16 INTERMEDIATE Intermediate     PIP/TAZO <=4 SENSITIVE Sensitive     * >=100,000 COLONIES/mL ESCHERICHIA COLI  CULTURE, BLOOD (ROUTINE X 2) w Reflex to ID Panel  Status: None   Collection Time: 06/16/2019  6:18 PM   Specimen: BLOOD  Result Value Ref Range Status   Specimen Description BLOOD BLOOD RIGHT HAND  Final   Special Requests   Final    BOTTLES DRAWN AEROBIC AND ANAEROBIC Blood Culture adequate volume   Culture   Final    NO GROWTH 5 DAYS Performed at [facility], [address] [place], [place] [address]    Report Status 06/05/2019 FINAL  Final  Respiratory Panel by RT PCR (Flu A&B, Covid) - Nasopharyngeal Swab     Status: None   Collection Time: 05/30/2019  9:14 PM   Specimen: Nasopharyngeal Swab  Result Value Ref Range Status   SARS Coronavirus 2 by RT PCR NEGATIVE NEGATIVE Final    Comment: (NOTE) SARS-CoV-2 target nucleic acids are NOT DETECTED. The SARS-CoV-2 RNA is generally detectable in upper respiratoy specimens during the acute phase of infection. The lowest concentration of SARS-CoV-2 viral copies this assay can detect is 131 copies/mL. A negative result does not preclude SARS-Cov-2 infection and should not be used as the sole basis for treatment or other patient management decisions. A negative result may occur with  improper specimen collection/handling, submission of specimen other than nasopharyngeal swab, presence of viral mutation(s) within the areas targeted by this assay, and inadequate number of viral copies (<131 copies/mL). A negative result must be combined with clinical observations, patient history, and epidemiological information. The expected result is Negative. Fact Sheet for Patients:   PinkCheek.be Fact Sheet for Healthcare Providers:  GravelBags.it This test is not yet ap proved or cleared by the [place] FDA and  has been authorized for detection and/or diagnosis of SARS-CoV-2 by FDA under an Emergency Use Authorization (EUA). This EUA will remain  in effect (meaning this test can be used) for the duration of the COVID-19 declaration under Section 564(b)(1) of the Act, 21 U.S.C. section 360bbb-3(b)(1), unless the authorization is terminated or revoked sooner.    Influenza A by PCR NEGATIVE NEGATIVE Final   Influenza B by PCR NEGATIVE NEGATIVE Final    Comment: (NOTE) The Xpert Xpress SARS-CoV-2/FLU/RSV assay is intended as an aid in  the diagnosis of influenza from Nasopharyngeal swab specimens and  should not be used as a sole basis for treatment. Nasal washings and  aspirates are unacceptable for Xpert Xpress SARS-CoV-2/FLU/RSV  testing. Fact Sheet for Patients: PinkCheek.be Fact Sheet for Healthcare Providers: GravelBags.it This test is not yet approved or cleared by the [place] FDA and  has been authorized for detection and/or diagnosis of SARS-CoV-2 by  FDA under an Emergency Use Authorization (EUA). This EUA will remain  in effect (meaning this test can be used) for the duration of the  Covid-19 declaration under Section 564(b)(1) of the Act, 21  U.S.C. section 360bbb-3(b)(1), unless the authorization is  terminated or revoked. Performed at [facility], [address] [place], [place] [address]   MRSA PCR Screening     Status: None   Collection Time: 06/19/2019 10:35 PM   Specimen: Nasopharyngeal  Result Value Ref Range Status   MRSA by PCR NEGATIVE NEGATIVE Final    Comment:        The GeneXpert MRSA Assay (FDA approved for NASAL specimens only), is one component of a comprehensive MRSA colonization surveillance  program. It is not intended to diagnose MRSA infection nor to guide or monitor treatment for MRSA infections. Performed at [facility], [address], [place], [place] [address]  Lab Basic Metabolic Panel: Recent Labs  Lab 06/02/19 0445 06/03/19 0538 06/03/19 1133 06/04/19 0515 06/05/19 0549 06/21/2019 0521  NA 147* 145  --  141 137 135  K 3.6 3.5  --  3.6 4.0 4.6  CL 102 101  --  100 97* 94*  CO2 24 22  --  23 22 20*  GLUCOSE 116* 136*  --  140* 125* 85  BUN 71* 86*  --  91* 74* 75*  CREATININE 2.72* 4.10*  --  4.63* 4.29* 5.13*  CALCIUM 6.6* 6.7*  --  7.1* 7.9* 8.3*  MG  --  1.9  --  1.9 1.9  --   PHOS  --   --  4.0 3.6 3.5  --    Liver Function Tests: Recent Labs  Lab 06/02/2019 1228 06/19/2019 2049 06/17/2019 0429 06/02/19 0445  AST 227* 825* 3,267* 3,731*  ALT 12 24 87* 191*  ALKPHOS 92 111 90 126  BILITOT 2.9* 3.4* 5.2* 5.4*  PROT 6.7 5.3* 4.8* 5.0*  ALBUMIN 4.1 3.0* 2.8* 2.6*   Recent Labs  Lab 06/19/2019 1228  LIPASE 18   Recent Labs  Lab 06/13/2019 1512 06/02/19 0445  AMMONIA 68* 39*   CBC: Recent Labs  Lab 06/07/2019 1228 06/02/2019 2049 06/20/2019 0429 06/02/19 0445 06/03/19 0538 06/04/19 0515 06/04/19 1805 06/04/19 2240 06/05/19 0549 06/05/19 1000 06/21/2019 0521  WBC 16.0* 23.1* 27.4* 20.8* 20.7* 17.8*  --   --   --   --  19.4*  NEUTROABS 12.4* 18.3*  --  16.5*  --   --   --   --   --   --   --   HGB 11.6* 11.2* 14.3 14.7 13.4 12.1 12.7 12.1 12.3 12.4 12.9  HCT 36.4 37.3 45.0 44.8 40.9 35.9* 37.6 37.0 36.8 37.5 37.3  MCV 91.2 98.9 91.1 87.3 87.6 84.3  --   --   --   --  82.7  PLT 381 244 158 167 147* 101*  --   --   --   --  116*   Cardiac Enzymes: Recent Labs  Lab 06/23/2019 2049 06/14/2019 0429  CKTOTAL 3,445* 3,496*   Sepsis Labs: Recent Labs  Lab 05/30/2019 2049 06/20/2019 0001 05/30/2019 0429 05/27/2019 0815 06/05/2019 1602 06/02/19 0445 06/03/19 0538 06/04/19 0515 21-Jun-2019 0521  PROCALCITON 0.30  --  0.55  --   --  1.70   --   --   --   WBC 23.1*  --  27.4*  --   --  20.8* 20.7* 17.8* 19.4*  LATICACIDVEN  --  9.0*  --  7.2* 6.8*  --  3.0*  --   --       Maretta Bees Lashay Osborne 2019-06-21, 6:02 PM

## 2019-06-24 NOTE — Progress Notes (Signed)
Ch attended to code blue for pt. Pt currently had DNR but the family rescinded the order and the pt is now intubated. Ch will f/u with family.    06-23-19 0800  Clinical Encounter Type  Visited With Health care provider;Patient  Visit Type Code  Referral From Nurse  Consult/Referral To Chaplain  Stress Factors  Patient Stress Factors Health changes  Family Stress Factors Major life changes;Loss of control

## 2019-06-24 NOTE — Procedures (Signed)
Endotracheal Intubation: Patient required placement of an artificial airway secondary to Respiratory Failure  Consent: Emergent.   Hand washing performed prior to starting the procedure.   Medications administered for sedation prior to procedure:   A time out procedure was called and correct patient, name, & ID confirmed. Needed supplies and equipment were assembled and checked to include ETT, 10 ml syringe, Glidescope, Mac and Miller blades, suction, oxygen and bag mask valve, end tidal CO2 monitor.   Patient was positioned to align the mouth and pharynx to facilitate visualization of the glottis.   Heart rate, SpO2 and blood pressure was continuously monitored during the procedure. Pre-oxygenation was conducted prior to intubation and endotracheal tube was placed through the vocal cords into the trachea.     The artificial airway was placed under direct visualization via glidescope route using a 7.5 ETT on the first attempt.  ETT was secured at 23 cm mark.  Placement was confirmed by auscuitation of lungs with good breath sounds bilaterally and no stomach sounds.  Condensation was noted on endotracheal tube.   Pulse ox 98%.  CO2 detector in place with appropriate color change.   Complications: None .   Operator: Alie Moudy.   Chest radiograph ordered and pending.   Comments: OGT placed via glidescope. Corrin Parker, M.D.  [facility]  Medical Director [place] Director [name] Cardio-Pulmonary Department

## 2019-06-24 NOTE — Progress Notes (Signed)
SLP Cancellation Note  Patient Details Name: [name] MRN: [id number] DOB: 08-18-1955   Cancelled treatment:       Reason Eval/Treat Not Completed: Medical issues which prohibited therapy;Patient not medically ready(chart reviewed; consulted NSG. ) Pt is now orally intubated; inappropriate for BSE. ST services to sign off. MD to reconsult if appropriate in future.      Orinda Kenner, MS, CCC-SLP [place] Con[place] 07/01/2019, 10:42 AM

## 2019-06-24 NOTE — Progress Notes (Signed)
Family At bedside, clinical status relayed to family  Updated and notified of patients medical condition-  Progressive multiorgan failure with very low chance of meaningful recovery.  Patient is in dying  Process.  Family understands the situation.  They have consented and agreed to DNR/DNI and would like to proceed with Comfort care measures.   Family are satisfied with Plan of action and management. All questions answered  Yolonda Purtle David Maiah Sinning, M.D.  [facility]  Medical Director ICU-[facility] [place] Medical Director [facility] Cardio-Pulmonary Department     

## 2019-06-24 NOTE — Progress Notes (Signed)
   Jun 17, 2019 1200  Clinical Encounter Type  Visited With Patient and family together  Visit Type Follow-up;Death  Referral From Nurse  Stress Factors  Family Stress Factors Loss  Pt expired. Ch provided emotional support to the family and encouraged them to share their memories of the pt. Family has support from pastor and is at peace. They described the pt as funny and stubborn. They reminisced the time when they went to the zoo together this summer.

## 2019-06-24 NOTE — Progress Notes (Signed)
Patient expired at 1122 hrs this AM. Son and daughter were at bedside. Patient was on CMO. CDS notified. MD notified. Post-mortem flowsheet complete.

## 2019-06-24 DEATH — deceased
